# Patient Record
Sex: Female | Born: 1945 | Race: White | Hispanic: No | Smoking: Never smoker
Health system: Southern US, Community
[De-identification: ages and names within clinical notes are randomized; demographics above are authoritative.]

## PROBLEM LIST (undated history)

## (undated) DIAGNOSIS — I10 Essential (primary) hypertension: Secondary | ICD-10-CM

## (undated) DIAGNOSIS — J449 Chronic obstructive pulmonary disease, unspecified: Secondary | ICD-10-CM

## (undated) DIAGNOSIS — I509 Heart failure, unspecified: Secondary | ICD-10-CM

## (undated) DIAGNOSIS — K219 Gastro-esophageal reflux disease without esophagitis: Secondary | ICD-10-CM

## (undated) DIAGNOSIS — M1990 Unspecified osteoarthritis, unspecified site: Secondary | ICD-10-CM

## (undated) DIAGNOSIS — E669 Obesity, unspecified: Secondary | ICD-10-CM

## (undated) DIAGNOSIS — G4733 Obstructive sleep apnea (adult) (pediatric): Secondary | ICD-10-CM

## (undated) DIAGNOSIS — I639 Cerebral infarction, unspecified: Secondary | ICD-10-CM

## (undated) DIAGNOSIS — D649 Anemia, unspecified: Secondary | ICD-10-CM

## (undated) DIAGNOSIS — I4891 Unspecified atrial fibrillation: Secondary | ICD-10-CM

## (undated) DIAGNOSIS — Z9981 Dependence on supplemental oxygen: Secondary | ICD-10-CM

## (undated) DIAGNOSIS — I2510 Atherosclerotic heart disease of native coronary artery without angina pectoris: Secondary | ICD-10-CM

## (undated) DIAGNOSIS — E785 Hyperlipidemia, unspecified: Secondary | ICD-10-CM

## (undated) DIAGNOSIS — R2681 Unsteadiness on feet: Secondary | ICD-10-CM

## (undated) HISTORY — DX: Gastro-esophageal reflux disease without esophagitis: K21.9

## (undated) HISTORY — DX: Dependence on supplemental oxygen: Z99.81

## (undated) HISTORY — PX: CATARACT EXTRACTION W/ INTRAOCULAR LENS  IMPLANT, BILATERAL: SHX1307

## (undated) HISTORY — DX: Hyperlipidemia, unspecified: E78.5

## (undated) HISTORY — DX: Obesity, unspecified: E66.9

## (undated) HISTORY — PX: UMBILICAL HERNIA REPAIR: SHX196

## (undated) HISTORY — DX: Unspecified osteoarthritis, unspecified site: M19.90

## (undated) HISTORY — DX: Unsteadiness on feet: R26.81

## (undated) HISTORY — PX: CHOLECYSTECTOMY: SHX55

## (undated) HISTORY — PX: KNEE ARTHROSCOPY: SUR90

## (undated) HISTORY — PX: EYE SURGERY: SHX253

## (undated) HISTORY — PX: APPENDECTOMY: SHX54

## (undated) HISTORY — PX: CERVICAL SPINE SURGERY: SHX589

## (undated) HISTORY — DX: Anemia, unspecified: D64.9

## (undated) HISTORY — PX: HIP SURGERY: SHX245

## (undated) HISTORY — DX: Obstructive sleep apnea (adult) (pediatric): G47.33

## (undated) HISTORY — DX: Unspecified atrial fibrillation: I48.91

## (undated) HISTORY — DX: Essential (primary) hypertension: I10

## (undated) HISTORY — DX: Atherosclerotic heart disease of native coronary artery without angina pectoris: I25.10

## (undated) HISTORY — DX: Cerebral infarction, unspecified: I63.9

## (undated) HISTORY — PX: CARPAL TUNNEL RELEASE: SHX101

## (undated) HISTORY — DX: Heart failure, unspecified: I50.9

---

## 2001-09-26 ENCOUNTER — Ambulatory Visit (HOSPITAL_COMMUNITY): Admission: RE | Admit: 2001-09-26 | Discharge: 2001-09-26 | Payer: Self-pay | Admitting: Neurosurgery

## 2001-09-26 ENCOUNTER — Encounter: Payer: Self-pay | Admitting: Neurosurgery

## 2001-10-05 ENCOUNTER — Encounter: Payer: Self-pay | Admitting: Neurosurgery

## 2001-10-07 ENCOUNTER — Inpatient Hospital Stay (HOSPITAL_COMMUNITY): Admission: RE | Admit: 2001-10-07 | Discharge: 2001-10-08 | Payer: Self-pay | Admitting: Neurosurgery

## 2001-10-07 ENCOUNTER — Encounter: Payer: Self-pay | Admitting: Neurosurgery

## 2002-11-22 ENCOUNTER — Ambulatory Visit (HOSPITAL_COMMUNITY): Admission: RE | Admit: 2002-11-22 | Discharge: 2002-11-22 | Payer: Self-pay | Admitting: Internal Medicine

## 2002-11-22 HISTORY — PX: COLONOSCOPY: SHX174

## 2002-11-22 HISTORY — PX: UPPER GASTROINTESTINAL ENDOSCOPY: SHX188

## 2003-09-28 ENCOUNTER — Encounter: Payer: Self-pay | Admitting: Pulmonary Disease

## 2004-04-14 ENCOUNTER — Encounter: Admission: RE | Admit: 2004-04-14 | Discharge: 2004-04-14 | Payer: Self-pay | Admitting: Orthopedic Surgery

## 2004-05-03 ENCOUNTER — Encounter: Admission: RE | Admit: 2004-05-03 | Discharge: 2004-05-03 | Payer: Self-pay | Admitting: Orthopedic Surgery

## 2004-05-03 ENCOUNTER — Ambulatory Visit (HOSPITAL_COMMUNITY): Admission: RE | Admit: 2004-05-03 | Discharge: 2004-05-03 | Payer: Self-pay | Admitting: Orthopedic Surgery

## 2004-05-21 ENCOUNTER — Ambulatory Visit (HOSPITAL_COMMUNITY): Admission: RE | Admit: 2004-05-21 | Discharge: 2004-05-22 | Payer: Self-pay | Admitting: Orthopedic Surgery

## 2004-11-05 ENCOUNTER — Observation Stay (HOSPITAL_COMMUNITY): Admission: AD | Admit: 2004-11-05 | Discharge: 2004-11-06 | Payer: Self-pay | Admitting: Cardiology

## 2004-11-05 ENCOUNTER — Ambulatory Visit: Payer: Self-pay | Admitting: Cardiology

## 2004-11-26 ENCOUNTER — Ambulatory Visit: Payer: Self-pay | Admitting: Cardiology

## 2004-11-26 ENCOUNTER — Ambulatory Visit: Payer: Self-pay

## 2005-04-14 ENCOUNTER — Encounter: Payer: Self-pay | Admitting: Pulmonary Disease

## 2005-08-22 ENCOUNTER — Ambulatory Visit (HOSPITAL_COMMUNITY): Admission: RE | Admit: 2005-08-22 | Discharge: 2005-08-23 | Payer: Self-pay | Admitting: Orthopedic Surgery

## 2005-10-10 ENCOUNTER — Ambulatory Visit: Payer: Self-pay | Admitting: *Deleted

## 2005-10-20 ENCOUNTER — Ambulatory Visit: Payer: Self-pay | Admitting: Cardiology

## 2005-11-26 HISTORY — PX: COLONOSCOPY: SHX174

## 2006-03-19 ENCOUNTER — Encounter: Admission: RE | Admit: 2006-03-19 | Discharge: 2006-04-15 | Payer: Self-pay | Admitting: Family Medicine

## 2006-04-16 ENCOUNTER — Encounter: Admission: RE | Admit: 2006-04-16 | Discharge: 2006-05-20 | Payer: Self-pay | Admitting: Family Medicine

## 2006-12-01 DIAGNOSIS — G4733 Obstructive sleep apnea (adult) (pediatric): Secondary | ICD-10-CM

## 2006-12-01 HISTORY — DX: Obstructive sleep apnea (adult) (pediatric): G47.33

## 2008-01-17 ENCOUNTER — Ambulatory Visit: Payer: Self-pay | Admitting: Cardiology

## 2008-01-17 LAB — CONVERTED CEMR LAB
BUN: 15 mg/dL (ref 6–23)
Basophils Relative: 0.3 % (ref 0.0–1.0)
CO2: 32 meq/L (ref 19–32)
GFR calc Af Amer: 109 mL/min
Glucose, Bld: 157 mg/dL — ABNORMAL HIGH (ref 70–99)
Hemoglobin: 11 g/dL — ABNORMAL LOW (ref 12.0–15.0)
Lymphocytes Relative: 20.6 % (ref 12.0–46.0)
MCV: 86.3 fL (ref 78.0–100.0)
Monocytes Absolute: 0.6 10*3/uL (ref 0.2–0.7)
Monocytes Relative: 7.3 % (ref 3.0–11.0)
Neutro Abs: 5.5 10*3/uL (ref 1.4–7.7)
Potassium: 4.1 meq/L (ref 3.5–5.1)
Prothrombin Time: 13.5 s — ABNORMAL HIGH (ref 10.9–13.3)

## 2008-01-20 ENCOUNTER — Ambulatory Visit: Payer: Self-pay | Admitting: Cardiology

## 2008-01-20 ENCOUNTER — Ambulatory Visit (HOSPITAL_COMMUNITY): Admission: RE | Admit: 2008-01-20 | Discharge: 2008-01-20 | Payer: Self-pay | Admitting: Cardiology

## 2008-01-25 ENCOUNTER — Encounter: Payer: Self-pay | Admitting: Pulmonary Disease

## 2008-01-26 ENCOUNTER — Encounter: Payer: Self-pay | Admitting: Cardiology

## 2008-01-26 ENCOUNTER — Ambulatory Visit: Payer: Self-pay | Admitting: Cardiology

## 2008-01-26 ENCOUNTER — Ambulatory Visit: Payer: Self-pay

## 2008-02-28 ENCOUNTER — Ambulatory Visit: Payer: Self-pay | Admitting: Pulmonary Disease

## 2008-02-28 DIAGNOSIS — C439 Malignant melanoma of skin, unspecified: Secondary | ICD-10-CM | POA: Insufficient documentation

## 2008-02-28 DIAGNOSIS — I509 Heart failure, unspecified: Secondary | ICD-10-CM

## 2008-02-28 DIAGNOSIS — G4730 Sleep apnea, unspecified: Secondary | ICD-10-CM | POA: Insufficient documentation

## 2008-02-28 DIAGNOSIS — J984 Other disorders of lung: Secondary | ICD-10-CM | POA: Insufficient documentation

## 2008-02-28 DIAGNOSIS — I739 Peripheral vascular disease, unspecified: Secondary | ICD-10-CM

## 2008-02-28 DIAGNOSIS — I270 Primary pulmonary hypertension: Secondary | ICD-10-CM

## 2008-03-01 ENCOUNTER — Ambulatory Visit: Payer: Self-pay | Admitting: Cardiology

## 2008-03-30 ENCOUNTER — Ambulatory Visit: Payer: Self-pay | Admitting: Pulmonary Disease

## 2008-03-30 ENCOUNTER — Ambulatory Visit: Admission: RE | Admit: 2008-03-30 | Discharge: 2008-03-30 | Payer: Self-pay | Admitting: Pulmonary Disease

## 2008-03-30 ENCOUNTER — Ambulatory Visit: Payer: Self-pay | Admitting: Cardiology

## 2008-04-06 ENCOUNTER — Encounter: Payer: Self-pay | Admitting: Pulmonary Disease

## 2008-04-26 ENCOUNTER — Encounter: Payer: Self-pay | Admitting: Pulmonary Disease

## 2008-05-03 ENCOUNTER — Ambulatory Visit: Payer: Self-pay | Admitting: Cardiology

## 2008-06-27 ENCOUNTER — Ambulatory Visit: Payer: Self-pay | Admitting: Pulmonary Disease

## 2008-06-28 ENCOUNTER — Ambulatory Visit: Payer: Self-pay | Admitting: Cardiology

## 2008-08-11 ENCOUNTER — Ambulatory Visit: Payer: Self-pay | Admitting: Cardiovascular Disease

## 2008-08-11 ENCOUNTER — Inpatient Hospital Stay (HOSPITAL_COMMUNITY): Admission: EM | Admit: 2008-08-11 | Discharge: 2008-08-14 | Payer: Self-pay | Admitting: Emergency Medicine

## 2008-09-06 ENCOUNTER — Ambulatory Visit: Payer: Self-pay | Admitting: Cardiology

## 2008-10-09 ENCOUNTER — Ambulatory Visit: Payer: Self-pay | Admitting: Cardiology

## 2008-10-24 ENCOUNTER — Ambulatory Visit: Payer: Self-pay | Admitting: Pulmonary Disease

## 2009-09-19 ENCOUNTER — Ambulatory Visit: Payer: Self-pay | Admitting: Cardiology

## 2009-09-19 DIAGNOSIS — R609 Edema, unspecified: Secondary | ICD-10-CM

## 2009-09-19 DIAGNOSIS — I4891 Unspecified atrial fibrillation: Secondary | ICD-10-CM | POA: Insufficient documentation

## 2010-09-25 ENCOUNTER — Ambulatory Visit: Payer: Self-pay | Admitting: Cardiology

## 2011-01-02 NOTE — Assessment & Plan Note (Signed)
Summary: [facility]      Allergies Added:   Visit Type:  Follow-up Primary Provider:  Mercie Eon  CC:  Atrial Fibrillation.  History of Present Illness: The patient presents for followup of atrial fibrillation. Since I last saw her she has had no cardiovascular complaints. She doesn't notice any palpitations, presyncope or syncope. She has had no chest pressure, neck or arm discomfort. She has had no new shortness of breath, PND or orthopnea. She said no change in her lower extremity edema.  Current Medications (verified): 1)  Lovaza 1 Gm  Caps (Omega-3-Acid Ethyl Esters) .... 2 Tabs Two Times A Day 2)  Furosemide 40 Mg Tabs (Furosemide) .... 2 Qam and 1 Qpm 3)  Warfarin Sodium 5 Mg Tabs (Warfarin Sodium) .... Daily 4)  Zyrtec Allergy 10 Mg  Tabs (Cetirizine Hcl) .... Once Daily 5)  Levemir 100 Unit/ml  Soln (Insulin Detemir) .... Once Daily 6)  Proventil Hfa 108 (90 Base) Mcg/act  Aers (Albuterol Sulfate) .... As Needed 7)  Diltiazem Hcl Er Beads 180 Mg Xr24h-Cap (Diltiazem Hcl Er Beads) .... Take One Capsule By Mouth Daily 8)  Lisinopril 20 Mg Tabs (Lisinopril) .... Daily 9)  Klor-Con M20 20 Meq Cr-Tabs (Potassium Chloride Crys Cr) .... Daily  Allergies (verified): 1)  ! Codeine  Past History:  Past Medical History: Coronary disease (catheterization on January 20, 2008, the left main is 25% stenosis, LAD proximal 60% stenosis, first   diagonal 40% stenosis, circumflex diffuse irregularities, RCA 25%   stenosis) Obstructive sleep apnea -diagnosed 3 yrs ago (CPAP is not working) Non-insulin dependent diabetes mellitus Obesity Hypertension Hyperlipidemia Gastroesophageal reflux disease Asthma  Past Surgical History: Reviewed history from 09/19/2009 and no changes required. Left knee arthroscopic surgery June 2005 Cervical diskectomy Cataracts surgery Cholecystectomy Carpal tunnel surgery Appendectomy Cesarean section Umbilical hernia repair  Review  of Systems       As stated in the HPI and negative for all other systems.   Vital Signs:  Patient profile:   65 year old female Height:      66 inches Weight:      322 pounds BMI:     52.16 Pulse rate:   74 / minute Resp:     18 per minute BP sitting:   142 / 78  (right arm)  Vitals Entered By: Levora Angel, CNA (September 25, 2010 10:23 AM)  Physical Exam  General:  Well developed, well nourished, in no acute distress. Head:  normocephalic and atraumatic Neck:  Neck supple, no JVD. No masses, thyromegaly or abnormal cervical nodes. Chest Wall:  no deformities or breast masses noted Lungs:  Clear bilaterally to auscultation and percussion. Heart:  Non-displaced PMI, chest non-tender;irregular rate and rhythm, S1, S2 without murmurs, rubs or gallops.  Abdomen:  Bowel sounds positive; abdomen soft and non-tender without masses, organomegaly, or hernias noted. No hepatosplenomegaly (morbid obesity compromises the exam) Msk:  Back normal, normal gait. Muscle strength and tone normal. Pulses:  absent dorsalis pedis and posterior tibialis bilaterally Extremities:  severe bilateral edema with chronic venous stasis changes Neurologic:  Alert and oriented x 3. Psych:  Normal affect.   EKG  Procedure date:  09/25/2010  Findings:      past atrial fibrillation, rate 78, axis within normal intervals normal limits, low voltage, diffuse not specific T wave flattening  Impression & Recommendations:  Problem # 1:  FIBRILLATION, ATRIAL (ICD-427.31) She tolerates this rhythm with rate control and anticoagulation. No change in therapy is indicated.  Orders: EKG w/ Interpretation (93000)  Problem # 2:  EDEMA (ICD-782.3) This is stable and multifactorial.  No change in therapy is needed.  She needs to lose weight and we have talked about this.  It is clear from conversation today that she does not watch her diet.  Patient Instructions: 1)  Your physician recommends that you schedule a  follow-up appointment in: 18 months with Dr Percival Spanish in [place] 2)  Your physician recommends that you continue on your current medications as directed. Please refer to the Current Medication list given to you today.

## 2011-02-06 DIAGNOSIS — I5023 Acute on chronic systolic (congestive) heart failure: Secondary | ICD-10-CM

## 2011-02-21 ENCOUNTER — Encounter (INDEPENDENT_AMBULATORY_CARE_PROVIDER_SITE_OTHER): Payer: Self-pay | Admitting: *Deleted

## 2011-02-27 NOTE — Letter (Signed)
Summary: Pre Visit Letter Revised  [facility]  [place], [place] [address]   Phone: [phone]  Fax: [phone]        02/21/2011 MRN: [id number] [facility] [place] [address] [place], [place]  [address]             Procedure Date:  04-01-11           Direct Colon----Dr. Carlean Purl   Welcome to the Gastroenterology Division at [facility].    You are scheduled to see a nurse for your pre-procedure visit on 03-18-11 at 2:30p.m. on the 3rd floor at [facility] [place] [employer].  We ask that you try to arrive at our office 15 minutes prior to your appointment time to allow for check-in.  Please take a minute to review the attached form.  If you answer "Yes" to one or more of the questions on the first page, we ask that you call the person listed at your earliest opportunity.  If you answer "No" to all of the questions, please complete the rest of the form and bring it to your appointment.    Your nurse visit will consist of discussing your medical and surgical history, your immediate family medical history, and your medications.   If you are unable to list all of your medications on the form, please bring the medication bottles to your appointment and we will list them.  We will need to be aware of both prescribed and over the counter drugs.  We will need to know exact dosage information as well.    Please be prepared to read and sign documents such as consent forms, a financial agreement, and acknowledgement forms.  If necessary, and with your consent, a friend or relative is welcome to sit-in on the nurse visit with you.  Please bring your insurance card so that we may make a copy of it.  If your insurance requires a referral to see a specialist, please bring your referral form from your primary care physician.  No co-pay is required for this nurse visit.     If you cannot keep your appointment, please call [phone] to cancel or reschedule prior to  your appointment date.  This allows [place] the opportunity to schedule an appointment for another patient in need of care.    Thank you for choosing [facility] for your medical needs.  We appreciate the opportunity to care for you.  Please visit [place] at our website  to learn more about our practice.  Sincerely, The Gastroenterology Division

## 2011-03-14 ENCOUNTER — Other Ambulatory Visit: Payer: Self-pay | Admitting: *Deleted

## 2011-03-14 MED ORDER — POTASSIUM CHLORIDE CRYS ER 20 MEQ PO TBCR: 20.0000 meq | EXTENDED_RELEASE_TABLET | Freq: Every day | ORAL | Status: DC

## 2011-03-18 ENCOUNTER — Ambulatory Visit (AMBULATORY_SURGERY_CENTER): Payer: Medicare Other | Admitting: *Deleted

## 2011-03-18 VITALS — Ht 66.0 in | Wt 343.0 lb

## 2011-03-18 DIAGNOSIS — Z1211 Encounter for screening for malignant neoplasm of colon: Secondary | ICD-10-CM

## 2011-03-18 NOTE — Progress Notes (Signed)
Colon cancelled and new patient visit made for pt. June 4 with Dr. Carlean Purl.  Pt. Is on coumadin and has multiple medical issues.

## 2011-04-01 ENCOUNTER — Other Ambulatory Visit: Payer: Medicare Other | Admitting: Internal Medicine

## 2011-04-15 NOTE — Cardiovascular Report (Signed)
NAME:  [name], [name] NO.:  [id number]   MEDICAL RECORD NO.:  [id number]          PATIENT TYPE:  OIB   LOCATION:  2853                         FACILITY:  [facility]   PHYSICIAN:  Minus Breeding, MD, FACCDATE OF BIRTH:  21-Jul-1946   DATE OF PROCEDURE:  DATE OF DISCHARGE:                            CARDIAC CATHETERIZATION   PRIMARY CARE PHYSICIAN:  Dr. Morrie [facility].   CARDIOLOGIST:  Dr. Percival Spanish.   PROCEDURE:  Left and right heart catheterization, coronary  arteriography.   INDICATIONS:  Evaluate patient with dyspnea and chest discomfort  suggestive of unstable angina.  She had previous nonobstructive disease  noted on catheterization.   PROCEDURE NOTE:  Left heart catheterization performed via right femoral  artery, right heart catheterization performed via right femoral vein.  Both vessels cannulated using the wall puncture.  A #5-French arterial  sheath and a #7-French venous sheath were inserted via the Seldinger  technique.  Preformed Judkins and pigtail catheter were utilized.  The  patient tolerated procedure well and left the lab in stable condition.   NOTE:  A Swan-Ganz catheter were used utilized as well.   HEMODYNAMIC RESULTS:  RA mean 17, RV 59/7 with a mean of 19, PA 58/28  with a mean of 42, pulmonary capillary wedge pressure mean 26 (there is  a large V-wave), the left anterior pressure 160/19 with a mean of 27, AO  164/75, cardiac output/cardiac index 5.38/2.34, pulmonary vascular  resistance 238 dynes.  Coronaries:  Left main at 25% stenosis.  The LAD  had a proximal 60% stenosis.  There was mid to distal 60% stenosis.  There was a first diagonal which was moderate-sized with ostial 40%  stenosis.  The circumflex in the AV groove had diffuse luminal  irregularities.  There was a first obtuse marginal which was large and  normal.  There was a second obtuse marginal which was moderate in size  and normal.  There were two posterolaterals which were  small and normal.  The right coronary artery is large dominant vessel.  He had a proximal  25% stenosis.  PDA was moderate size and normal.  Posterolateral branch  too was small and normal.   LEFT VENTRICULOGRAM:  The left ventriculogram was obtained in the RAO  projection.  The EF 65% with trace mitral regurgitation.   CONCLUSION:  Nonobstructive coronary artery disease.  She has preserved  ejection fraction.  She does have an elevated left ventricular end-  diastolic pressure and elevated pulmonary pressures.  There is a big V-  wave on the wedge tracing.  I think this indicates some left ventricular  diastolic dysfunction contributing to pulmonary hypertension.  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   PLAN:  I am getting an echocardiogram to further rule out any mitral  regurgitation or other abnormalities and to again look at diastolic  parameters.  She is going to need volume management.  We are going to  manage her with secondary risk reduction and  manager her coronary  disease medically.  Weight loss is going to be absolutely critical.  I  am going to try to get her to get her sleep apnea better treated as she  is not wearing CPAP.      Minus Breeding, MD, [facility]  Electronically Signed     JH/MEDQ  D:  01/20/2008  T:  01/21/2008  Job:  [id number]   cc:   Chipper Herb, M.D.

## 2011-04-15 NOTE — Assessment & Plan Note (Signed)
[facility] OFFICE NOTE   NAME:Goldston, ZAFIRO MILNES                    MRN:          [id number]  DATE:01/26/2008                            DOB:          03-12-1946    PRIMARY CARE PHYSICIAN:  Chipper Herb, M.D.   REASON FOR PRESENTATION:  Evaluate patient with dyspnea.   HISTORY OF PRESENT ILLNESS:  The patient presents for follow-up of her  dyspnea.  She had a cardiac catheterization on January 20, 2008.  This  demonstrated elevated right-sided pressures with a mean pulmonary  pressure 42 and capillary wedge pressure 26 with large V-waves.  She did  have some elevated end-diastolic pressure.  Coronaries demonstrated 25%  left main stenosis, LAD had proximal 60% stenosis, mid to distal LAD had  60% stenosis, first diagonal was moderate-size with ostial 40% stenosis,  circumflex had luminal irregularities, the right coronary artery was  dominant with about 25% stenosis.  The EF 65%.   The patient did well after catheterization.  She still complains of  dyspnea.  She is in permanent atrial fibrillation.  She does feel her  palpitations.  So is not having any new complaints and has had no chest  discomfort, PND or orthopnea.   PAST MEDICAL HISTORY:  1. Coronary disease as described.  2. Obstructive sleep apnea (not consistently wearing CPAP).  3. Non-insulin-dependent diabetes mellitus  4. Obesity.  5. Hypertension.  6. Hyperlipidemia.  7. Gastroesophageal reflux disease.  8. Asthma.  9. Left knee arthroscopic surgery June 2005.  10.Cervical diskectomy.  11.Cataract.  12.Cholecystectomy.  13.Carpal tunnel surgery.  14.Appendectomy.  15.Cesarean section.  16.Umbilical hernia.   ALLERGIES:  CODEINE.   CURRENT MEDICATIONS:  1. Lovaza 2 grams b.i.d.  2. Actos 30 mg daily.  3. Amitriptyline 50 mg nightly.  4. Glimepiride 6 mg daily.  5. Simcor 1000/20 daily.  6. Sertraline.  7. Furosemide.  8.  Coumadin.  9. Zyrtec 10 mg daily  10.Levemir.   REVIEW OF SYSTEMS:  As stated in the HPI, otherwise negative for other  systems.   PHYSICAL EXAMINATION:  GENERAL APPEARANCE:  The patient is in no acute  distress.  She is pleasant.  VITAL SIGNS:  Blood pressure 156/83, heart rate 81 and regular, weight  326 pounds, body mass index 57.  NECK:  No jugular venous distension at 45 degrees, carotid upstroke  brisk and symmetrical.  No bruits, no thyromegaly.  LUNGS:  Clear to auscultation bilaterally.  HEART:  PMI not displaced or sustained, S1-S2 within normal, no S3, no  murmurs.  ABDOMEN:  Obese, positive bowel sounds, normal in frequency and pitch,  no rebound or guarding, no obvious midline pulsatile mass or  organomegaly.  EXTREMITIES:  2+ pulse, nonpitting edema.   EKG:  Atrial fibrillation, low voltage, axis within normal limits, no  nonspecific T-wave flattening.   ASSESSMENT/PLAN:  1. Dyspnea.  I think this is multifactorial.  She does have some      evidence of pulmonary hypertension and she had V-waves.  I am going      to look for mitral regurgitation  on the echo that was done today      and also look for other indicators of diastolic dysfunction.      Certainly she needs to lose weight.  She needs to wear her CPAP.      She needs to restrict salt and fluid.  She needs good blood      pressure control.  2. Atrial fibrillation.  She has rate control and she is tolerating      anticoagulation.  3. Hypertension.  She is not sure what blood pressure medication she      is taking and I do not see it listed.  She is going to call back as      she is probably going to need an adjustment with either an addition      or up-titration.  4. Follow-up:  Will see her back in about six weeks in [place], [place].     Minus Breeding, MD, [facility]  Electronically Signed    JH/MedQ  DD: 01/26/2008  DT: 01/27/2008  Job #: [id number]   cc:   Chipper Herb, M.D.

## 2011-04-15 NOTE — Assessment & Plan Note (Signed)
[facility]                            CARDIOLOGY OFFICE NOTE   NAME:Ysaguirre, CHRISTENE STORDAHL                    MRN:          [id number]  DATE:06/28/2008                            DOB:          February 07, 1946    PRIMARY CARE PHYSICIAN:  Chipper Herb, MD.   REASON FOR PRESENTATION:  Evaluate the patient for dyspnea.   HISTORY OF PRESENT ILLNESS:  The patient presents for followup of her  known dyspnea.  She has had an extensive workup in the past.  She has  seen Dr. Elsworth Soho and is being treated with Proventil.  There may be some  component reactive airway disease.  I have also documented that she has  had pulmonary hypertension.  This has been somewhat related to probable  left ventricular diastolic dysfunction.  Also her Clydene Laming unit is greater  than 3, indicating probable pulmonary artery hypertension as well.  She  is morbidly obese and does have sleep apnea.   She says her dyspnea is at baseline.  She does get short of breath doing  moderate activity.  She can do some limited housework.  She is limited  and also by chronic lower extremity swelling.  This has not changed.  Her breathing has not changed or perhaps slightly better with a  Proventil.  She is not describing PND or orthopnea.  She has not been  having any palpitation, presyncope, or syncope.  She remains in atrial  fibrillation.  She has had no chest discomfort.   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   ALLERGIES:  CODEINE.   MEDICATIONS:  Lovaza 2 g b.i.d., amitriptyline 50 mg nightly,  glimepiride 6 mg daily, Simcor 1000/20 daily, sertraline, Coumadin,  Zyrtec, Levemir, Proventil, furosemide 40 mg daily, and Cardizem 120 mg  daily.   REVIEW OF SYSTEMS:  As stated in the HPI, otherwise negative for other  systems.   PHYSICAL EXAMINATION:  GENERAL:  The patient is in no distress.  VITAL SIGNS:  Blood pressure 158/72, heart rate 78 and regular, weight  332 pounds, body mass index 57!  HEENT:  Eyes are unremarkable.  Pupils are equal, round, and reactive to  light.  Fundi not visualized.  Oral mucosa unremarkable.  NECK:  No obvious jugular venous distension at 45 degrees.  Carotid  upstroke brisk and symmetrical.  No bruits.  No thyromegaly.  LYMPHATICS:  No cervical, axillary, or inguinal adenopathy.  LUNGS:  Decreased breath sounds without wheezing or crackles.  BACK:  No costovertebral angle tenderness.  CHEST:  Unremarkable.  HEART:  PMI not displaced or sustained.  S1 and S2 within normals  limits.  No S3, S4, no clicks, no rubs, or murmurs.  ABDOMEN:  Morbidly obese, positive bowel sounds.  Normal in frequency  and pitch.  No bruits, no rebound, and no guarding.  No midline  pulsatile mass.  No hepatomegaly or splenomegaly.  SKIN:  No rashes, no nodules.  EXTREMITIES:  2+ upper pulses, unable to appreciate dorsalis pedis and  post tibialis bilaterally.  Bilateral moderate lower extremity edema  with chronic venous stasis changes.  NEURO:  Orient to person, place, and time.  Cranial nerves II-XII  grossly intact.  Motor grossly intact.   ASSESSMENT AND PLAN:  1. Dyspnea.  The patient's predominant complaint is shortness of      breath.  I do believe she may have some  component to pulmonary      artery hypertension, though it is a mixed picture with clear      diastolic dysfunction as well.  There is also some component of      asthma.  I think a big component is deconditioning.  Given all of      this, I doubt that she would benefit from drugs, specifically for      management of pulmonary artery hypertension.  What she really needs      is to continue diuretic, control of her blood pressure, fluid and      salt restriction, and most importantly weight loss.  2. Lower extremity edema as above.  3. Hypertension.  I will take this opportunity to increase her      Cardizem to 180 mg daily as she has not yet controlled with her      blood pressure.  4. Morbid obesity.  We discussed this and I have referred her to [facility], where she needs to discuss with them about      possible bariatric surgery.  5. Coronary artery disease.  She had nonobstructive disease and she      will continue with risk reduction.  6. Followup.  I will see back her in 6 months or sooner if needed.     Minus Breeding, MD, [facility]  Electronically Signed    JH/MedQ  DD: 06/28/2008  DT: 06/29/2008  Job #: [id number]   cc:   Chipper Herb, M.D.

## 2011-04-15 NOTE — Assessment & Plan Note (Signed)
[facility]                            CARDIOLOGY OFFICE NOTE   NAME:[name], [name]                    MRN:          [id number]  DATE:03/01/2008                            DOB:          Apr 24, 1946    PRIMARY CARE PHYSICIAN:  Chipper Herb, M.D.   REASON FOR PRESENTATION:  Evaluate the patient with dyspnea.   HISTORY OF PRESENT ILLNESS:  The patient presents for follow up of her  shortness of breath.  This has not gotten any better despite starting  diuretic.  She was put on Lasix 20 mg daily.  She is still getting  dyspneic with moderate exertion such as walking moderate distance on  level ground.  She did see Dr. Elsworth Soho and is apparently having several  pulmonary tests done.  I am not sure what these are as I have not  received a record from him.  She has not had any new PND or orthopnea.  She remains in persistent atrial fibrillation.   PAST MEDICAL HISTORY:  1. Coronary artery disease (25% left main stenosis, LAD 60% proximal      stenosis, mid-distal LAD 60% stenosis, first diagonal moderate-      sized with ostial 40% stenosis, circumflex luminal irregularities,      right coronary artery at 25% stenosis, the EF 65%.).  2. Obstructive sleep apnea (not consistently wearing CPAP).  3. Non-insulin diabetes mellitus.  4. Obesity.  5. Hypertension.  6. Hyperlipidemia.  7. Gastroesophageal reflux disease.  8. Asthma.  9. Elevated right-sided heart pressures (mean pulmonary artery      pressure was 42 with a wedge pressure of 26 and large V-waves.  The      patient did not have MR on transthoracic echo).  10.Left knee arthroscopic surgery.  11.Cervical diskectomy.  12.Cataract.  13.Cholecystectomy.  14.Carpal tunnel surgery.  15.Appendectomy.  16.Cesarean section.  17.Umbilical hernia repair.   ALLERGIES:  CODEINE.   MEDICATIONS:  1. Lovaza 2 gm b.i.d.  2. Actos 30 mg daily.  3. Amitriptyline 50 mg q.h.s.  4. Glimepiride 6 mg  daily.  5. Simcor 100/20 daily.  6. Sertraline 50 mg q.a.m.  7. Coumadin.  8. Zyrtec 10 mg daily.  9. Levemir.  10.Proventil.  11.Furosemide 40 mg daily.   REVIEW OF SYSTEMS:  As stated in the HPI, otherwise negative for other  systems.   PHYSICAL EXAMINATION:  GENERAL:  The patient is in no acute distress.  VITAL SIGNS:  Blood pressure 144/70, heart rate 86 and regular, oxygen  saturation on room air 95%, weight 332 pounds, body mass index 57.  HEENT:  Eyes are unremarkable.  Pupils equal, round and reactive to  light.  Fundi not visualized.  NECK:  No jugular venous distention at 45 degrees.  Carotid upstroke  brisk and symmetric, no bruits or thyromegaly.  LUNGS:  Clear to auscultation bilaterally.  BACK:  No costovertebral angle mass.  CHEST:  Unremarkable.  HEART:  PMI not displaced or sustained, S1-S2 within normal limits.  No  S3, no murmurs.  ABDOMEN:  Morbidly obese, positive bowel sounds, normal in frequency and  pitch.  No bruits, rebound, guarding or midline pulsatile mass,  organomegaly.  SKIN:  No rashes, no nodules.  EXTREMITIES:  2+ pulses, nonpitting bilateral lower extremity edema, no  cyanosis or clubbing.  NEURO:  Oriented to person, place and time.  Cranial nerves II-XII  grossly intact.  Motor grossly intact.   ASSESSMENT/PLAN:  1. Dyspnea.  The patient is currently getting a pulmonary workup.  I      am going to check Dr. Bari Mantis records to see what these tests are.      She does have elevated pulmonary pressures and elevated wedge.  I      will wait to see a completed pulmonary workup before deciding any      further testing.  TEE might be helpful to make sure there is no      occult mitral valve disease.  2. Atrial fibrillation.  I have not applied a 24 hour monitor to make      sure she has reasonable rate control.  She is tolerating      anticoagulation.  I will put a Holter on.  3. Hypertension.  Blood pressure is better controlled and she will       continue with the medicines as listed.  4. Morbid obesity.  This is I think contributing significantly to many      of the patient's complaints and she needs to treat this with diet      and exercise as she has not been at all successful.  5. Coronary disease.  She has nonobstructive coronary disease.  She      does have some chest discomfort.  However, there is no evidence      that this is anginal.  She will continue with risk reduction.   FOLLOW UP:  I would like to see her back in about 2 months after the  pulmonary workup or sooner if she has any acute problems.     Minus Breeding, MD, [facility]  Electronically Signed    JH/MedQ  DD: 03/01/2008  DT: 03/01/2008  Job #: [id number]   cc:   Rigoberto Noel, MD  Chipper Herb, M.D.

## 2011-04-15 NOTE — H&P (Signed)
NAMEJAVONNI, [name]             ACCOUNT NO.:  [id number]   MEDICAL RECORD NO.:  [id number]          PATIENT TYPE:  INP   LOCATION:  2040                         FACILITY:  [facility]   PHYSICIAN:  Wendy Bamberg. Johnsie Cancel, MD, FACCDATE OF BIRTH:  07/26/1946   DATE OF ADMISSION:  08/11/2008  DATE OF DISCHARGE:                              HISTORY & PHYSICAL   PRIMARY CARDIOLOGIST:  Wendy Breeding, MD, [facility], in [place].   PRIMARY CARE [name]:  [name]. [name], nurse practitioner in  [place], [place].   PATIENT PROFILE:  A 65 year old Caucasian female with history of  nonobstructive CAD, chronic diastolic heart failure, pulmonary  hypertension as well as chronic AFib on Coumadin who presents with chest  pain as well as acute-on-chronic diastolic CHF.   PROBLEM LIST:  1. Acute-on-chronic diastolic congestive heart failure.  1A.  On January 26, 2008, 2-D echocardiogram, EF 55-60%, mild LVH, mild  AI and MR, mild-to-moderately dilated LA an RA, moderately elevated peak  pulmonary artery systolic pressure, and dilated IVC.  1. Nonobstructive CAD.  2A.  On January 20, 2008, right and left heart cardiac catheterization  RA17.  RV 59/7, PA 58/28 (42).  PCW P26 with large V wave.  Left main  25%, LAD 60% proximal and distal, D1 40% ostial, left circumflex diffuse  irregularities, OM1 normal, OM2 normal, RCA 25% proximal, PDA normal, EF  65% with trace mitral regurgitation.  1. Pulmonary arterial hypertension.  2. Hypertension.  3. Hyperlipidemia.  4. Morbid obesity.  5. Chronic lower extremity edema.  6. Obstructive sleep apnea with CPAP noncompliance.  7. Type 2 diabetes mellitus.  8. GERD.  9. Asthma.  10.History of left arthroscopic knee surgery, September 2006.  11.Status post cervical diskectomy.  12.Status post cholecystectomy.  13.Cataracts.  14.Status post appendectomy.  15.Permanent atrial fibrillation on chronic Coumadin therapy followed      at  [facility].   HISTORY OF PRESENT ILLNESS:  A 65 year old Caucasian female with history  of chronic diastolic heart failure, pulmonary hypertension, and  nonobstructive CAD.  She has chronic dyspnea on exertion along with a  history of chest pain occurring 2-3 times per year dating back to 2005.  Her last catheterization in February 2009, showed nonobstructive disease  with elevated right heart pressures.  Over the past week, she has noted  increasing dyspnea on exertion and lower extremity edema with a 14-pound  weight gain (from 319 up to 333) over the past 3 days.  No change in  chronic two-pillow orthopnea.  She has had PND and denies early satiety.   Last evening, while at rest, she developed 9/9 substernal chest pain and  pressure associated with shortness of breath.  She denies any nausea,  vomiting, lightheadedness, or diaphoresis.  Chest pain persisted  throughout the evening last night, although she was able to fall asleep  with it.  She did wake once during the night and sat at the edge of her  bed for a couple minutes prior to going back to sleep.  When she woke  this morning, she continued to have 9/10 chest  pain.  She had a  scheduled point with [name] at [facility] and presented there complaining of chest pain.  An ECG was  performed showing atrial fibrillation without any acute changes.  Apparently, some lab work is drawn and she was anemic.  She was treated  with nitroglycerin x2, which did not help her chest pain and then was  apparently given morphine, which did help her chest pain.  She was taken  to the [facility] ED where currently, she states her pain is 5/10.  She  currently appears comfortable and in no acute distress.   ALLERGIES:  CODEINE.   HOME MEDICATIONS:  1. Lovaza 2 g b.i.d.  2. Amitriptyline 50 mg nightly.  3. Zoloft 50 mg daily.  4. Coumadin 7.5 mg daily.  5. Levemir 50 units daily.  6.  Proventil b.i.d.  7. Lasix 4 mg daily.  8. Diltiazem 120 mg daily.   FAMILY HISTORY:  Mother died in her 79s following MI with a history of  diabetes and Alzheimer's.  Father died of Alzheimer's at 8.  She has 5  sisters and 2 brothers.  There is a history diabetes and MI in her  siblings.   SOCIAL HISTORY:  She lives in [place] with her daughter and  granddaughter.  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  She denies tobacco, alcohol, or drug use.  She is not routinely  exercising.  She tries to avoid salt, although she eats fast food once a  week and has had a hot dog yesterday.   REVIEW OF SYSTEMS:  Positive for chest pain, shortness breath, dyspnea  on exertion 1-2 pillow orthopnea, and PND over the past week, chronic  lower extremity edema that has been worse in the 3 days.  She has had 14-  pound weight gain over the past 3 days.  She has had some chills over  the past couple of days, although denies fevers.  She has chronic  constipation and history of diabetes.  Otherwise, all systems reviewed  and negative.   PHYSICAL EXAMINATION:  VITAL SIGNS:  Temperature 97.7, heart rate 77,  respirations 20, blood pressure 146/69, pulse ox 97% on 2 L.  GENERAL:  Pleasant white female, in no acute distress.  Awake, alert,  and oriented x3.  HEENT:  Normal with the exception of poor dentition.  NEUROLOGIC:  Grossly intact and nonfocal.  SKIN:  Warm and dry without lesions or masses.  NECK:  Obese.  Difficult to assess JVP.  There is no bruits.  LUNGS:  Respirations are unlabored with crackles approximately a third  of the way out bilaterally.  CARDIAC:  Irregularly irregular S1 and S2.  No S3, S4, or murmurs.  Heart sounds distant.  ABDOMEN:  Obese, semi firm and nontender.  Bowel sounds present x4.  EXTREMITIES:  Warm, dry, and pink with 3+ bilateral lower extremity  edema to the knees.   Chest x-ray is pending.  EKG shows atrial fibrillation rate of 73 in  normal  axis.  No acute ST-T changes.   LABORATORY DATA:  Pending.   ASSESSMENT AND PLAN:  1. Acute-on-chronic diastolic congestive heart failure.  The patient      presents with a 1-week history of progressive dyspnea and a 14-      pound weight gain over the past 3 days.  She does have significant      edema and crackles at bases on exam.  We will  check chest x-ray as      well as BNP.  She admits eating fast food once a week and had a hot      dog yesterday, thus sodium load may be contributing to her      symptoms.  Her blood pressure is up, but not terribly high and her      heart rate is reasonably well controlled.  I will plan to admit and      will cycle her cardiac markers.  We will add Lasix 40 mg IV b.i.d.      and otherwise continue her diltiazem for rate control and blood      pressure management.  Titrate this as needed.  She would likely      benefit from CHF education.  2. Chest pain.  The patient has a history of chest pain dating back to      2005.  She has had 2 cardiac catheterizations since then each      showing nonobstructive CAD.  Her last cath was in February 2009.      We will cycle cardiac markers.  She has had chest pain for roughly      16 hours now.  3. Hypertension.  We are adding Lasix in hope that with diuresis, her      blood pressure will come down some.  Continue diltiazem and adjust      as necessary.  4. Chronic atrial fibrillation is well rate controlled.  We will      continue her Coumadin via pharmacy.  Continue diltiazem.  5. Chronic lower extremity edema.  See #1.  IV Lasix.  6. Diabetes mellitus.  Continue Levemir and add sliding scale insulin.  7. History of chronic obstructive pulmonary disease and asthma and she      is not currently wheezing.  Continue her Proventil.  8. Morbid obesity.  The patient benefit from nutrition counseling both      as in and outpatient.  9. Hyperlipidemia.  Continue Lovaza.  10.?Anemia.  The patient was apparently  anemic on blood work from      [employer].  We do not have that here.  We will check CBC      as well as anemia profile and guaiac stools.      Murray Hodgkins, ANP      [place] Johnsie Cancel, MD, [facility]  Electronically Signed    CB/MEDQ  D:  08/11/2008  T:  08/12/2008  Job:  [id number]

## 2011-04-15 NOTE — Assessment & Plan Note (Signed)
[place]                            CARDIOLOGY OFFICE NOTE   NAME:[name]                    MRN:          [id number]  DATE:09/06/2008                            DOB:          06/22/1946    PRIMARY CARE PHYSICIAN:  Dr. Mercie Eon.   REASON FOR PRESENTATION:  Evaluate the patient with diastolic heart  failure.   HISTORY OF PRESENT ILLNESS:  The patient was hospitalized from August 11, 2008, to September 14, [id number], with diastolic heart failure.  She was  managed with diuretics and had significant weight loss during the  hospitalization.  She had her meds changed slightly.  She was counseled  more strictly on wearing CPAP, which she is doing.  She was counseled on  watching her salt, which she is doing.  She is not weighing herself  daily, and she has been staying at 319 pounds in the morning.  She is  not having any new dyspnea.  She is not having any PND or orthopnea.  She had no palpitations, presyncope, or syncope.   PAST MEDICAL HISTORY:  Heart failure with a well-preserved ejection  fraction (EF 55-60% with moderately elevated peak pulmonary pressures);  nonobstructive coronary artery disease (catheterization on January 20, 2008, the left main is 25% stenosis, LAD proximal 60% stenosis, first  diagonal 40% stenosis, circumflex diffuse irregularities, RCA 25%  stenosis); mild pulmonary artery hypertension; hypertension;  hyperlipidemia; morbid obesity; chronic lower extremity edema; sleep  apnea, now compliant with CPAP; type 2 diabetes mellitus;  gastroesophageal reflux disease; asthma; left arthroscopic knee surgery;  status post cervical diskectomy; cholecystectomy; cataracts;  appendectomy; and atrial fibrillation on chronic Coumadin therapy.   ALLERGIES INTOLERANCE:  CODEINE.   MEDICATIONS:  1. Levemir.  2. Janumet.  3. Vitamin B12.  4. Fish oil.  5. Klor-Con.  6. Duetact 30/2 daily.  7. Cardizem 180 mg  daily.  8. Furosemide 40 mg q.a.m.  9. Proventil.  10.Warfarin.  11.Amitriptyline.  12.Lovaza 2 g b.i.d.   REVIEW OF SYSTEMS:  As stated in the HPI and otherwise negative for  other systems.   PHYSICAL EXAMINATION:  GENERAL:  The patient is in no distress.  VITAL SIGNS:  Blood pressure 126/74, heart rate 87 and regular, body  mass index 56, and weight 324 pounds.  NECK:  No obvious jugular venous distention at 45 degrees, carotid  upstroke brisk and symmetrical, no bruits, no thyromegaly. LUNGS:  Clear  to auscultation bilaterally.  HEART:  PMI not displaced or sustained, S1 and S2 within normal limits,  no S3, no S4, no clicks, no rubs, no murmurs.  ABDOMEN:  Compromised by her size, there is no rebound or guarding, her  bowel sounds are normal, there is no appreciable hepatomegaly or  splenomegaly or midline pulsatile mass.  SKIN:  No rashes, no nodules, chronic venous stasis changes.  EXTREMITIES:  Pulses are 2+, moderate nonpitting bilateral lower  extremity edema.  NEURO:  Grossly intact.   EKG; atrial fibrillation, rate 87, axis within normal limits, intervals  within normal limits, and no acute ST-T wave changes.   ASSESSMENT  AND PLAN:  1. Dyspnea.  The patient's dyspnea is improved.  I think she is a      little more compliant than she had been in the past.  She needs      continued blood pressure.  Control fluid management and salt      restriction.  2. Morbid obesity.  I told her she absolutely needs to lose weight and      she did lose 8 pounds which may have been fluid.  Hopefully, she      will restrict her calories.  3. Lower extremity edema.  We are treating this conservatively.  4. Hypertension.  Blood pressure is controlled and she will continue      the meds as listed.  5. Atrial fibrillation.  She is on Coumadin.  She has had reasonable      rate control.  No further evaluation is warranted.  6. Followup.  I will see her back in 6 months or sooner if  needed.     Minus Breeding, MD, [facility]  Electronically Signed    JH/MedQ  DD: 09/06/2008  DT: 09/06/2008  Job #: [id number]   cc:   Mercie Eon, NP

## 2011-04-15 NOTE — Assessment & Plan Note (Signed)
[facility] OFFICE NOTE   NAME:[name], [name]                    MRN:          [id number]  DATE:01/17/2008                            DOB:          Jul 09, 1946    PRIMARY:  Dr. Morrie Sheldon.   REASON FOR CONSULTATION:  Evaluate patient with chest discomfort.   HISTORY OF PRESENT ILLNESS:  The patient is now 65 years old.  She has a  history of nonobstructive coronary disease and paroxysmal atrial  fibrillation.  We saw her last in 2006 in consultation for the atrial  fibrillation.   The patient describes a exertional chest discomfort.  This has been  going on, she says, from months, but it is getting worse.  It is  happening with less exertion and more intensity.  She describes as  substernal discomfort that radiates down her left arm when she does  things such as walk up a set of stairs or tries to mop the floor.  She  has to stop what she is doing and it takes several minutes for the  discomfort to go away.  If she restarts something like this it will  usually return.  It can be 8/10 in intensity.  It is nagging, tight, and  sharp discomfort.  She gets short of breath.  She gets somewhat sweaty.  She gets presyncopal.  She does not have this at rest.  She does  describe increasing dyspnea with exertion but not any resting shortness  of breath and has not had any PND or orthopnea.  She is also noted to be  in atrial fibrillation.  She has had this paroxysmally.  She does feel  her heart racing at times.  She is not limited by this and has not had  any presyncope or syncope.   PAST MEDICAL HISTORY:  Nonobstructive coronary disease (November 06, 2004  30-40% first diagonal stenosis.  EF 55%), obstructive sleep apnea (she  is currently not wearing her CPAP because she is out of filters), non-  insulin diabetes mellitus, obesity, hypertension, hyperlipidemia,  gastroesophageal reflux disease, asthma.   PAST  SURGICAL HISTORY:  Left knee arthroscopic surgery June 2005,  cervical diskectomy, cataracts surgery, cholecystectomy, carpal tunnel  surgery, appendectomy, cesarean section, umbilical hernia repair.   ALLERGIES:  CODEINE.   MEDICATIONS:  Lovaza 2 grams b.i.d., Actos 30 mg daily, amitriptyline 50  mg q.h.s., glimepiride 6 mg daily, Simcor 1000/20 daily, sertraline 50  mg daily, furosemide 20-40 mg daily, Coumadin, Zyrtec 10 mg daily,  Levemir (not clear the dose).   SOCIAL HISTORY:  The patient lives with her daughter and granddaughter.  She does not smoke cigarettes.  Does not drink alcohol.   FAMILY HISTORY:  Contributory for her mother having myocardial function  her 50s.   REVIEW OF SYSTEMS:  As stated in the HPI and negative for other systems.   PHYSICAL EXAMINATION:  The patient is in no distress.  Blood pressure 140/65, heart rate 86 and regular, weight 322 pounds,  body mass index 57.  HEENT:  Eyelids unremarkable, pupils equal, round, reactive, fundi not  visualized, oral mucosa unremarkable.  NECK:  No jugular distention, waveform within normal limits, carotid  upstroke brisk and symmetric, no bruits, thyromegaly.  Lymphatics no cervical, axillary, inguinal.  LUNGS:  Clear to auscultation bilaterally.  BACK:  No costovertebral his.  CHEST:  Unremarkable.  HEART:  PMI not displaced or sustained, S1-S2 within normal limits, no  S3, no S4, no clicks, rubs, no murmurs.  ABDOMEN:  Morbidly obese, positive bowel sounds.  Normal in frequency  and pitch, no bruits, rebound, guarding or midline pulsatile mass.  No  hepatomegaly, no splenomegaly.  SKIN:  No rashes, no nodules.  EXTREMITIES:  2+ pulses, moderate bilateral nonpitting edema in the  lower extremities, chronic venous stasis changes.  NEURO:  Oriented to place, time, cranial nerves II-XII grossly intact,  motor grossly intact.   EKG (done Dr. Tawanna Sat office on February 9) atrial fibrillation, axis  within normal  limits, intervals within normal limits, no acute ST-T wave  changes.   ASSESSMENT/PLAN:  1. Chest discomfort, the patient's discomfort is highly suspicious of      exertional angina.  It would be unstable based on the fact that it      is increasing in intensity and frequency.  Therefore, she is      cardiac catheterization.  I think pretest probability is too high      to rely on stress perfusion imaging.  Further evaluation based on      these results.  I will give her a low-dose beta blocker, metoprolol      25 mg daily extended release.  2. Atrial fibrillation.  She is on Coumadin.  Once we are done with      this evaluation will judge rate control with a Holter if she is      persistently in the a fib.  She left, for Coumadin of course for      the catheterization.  3. Lower extremity swelling and dyspnea.  Will evaluate this with an      echocardiogram and right heart catheterization.  4. Diabetes.  She will continue medications as listed.  5. Dyslipidemia.  Per Dr. Laurance Flatten with goal LDL less than 70 and HDL      greater than 50.  6. Obesity.  She absolutely needs to lose weight.  Unfortunately she      has been going the opposite direction.  7. Sleep apnea.  She needs to get back on the CPAP.  This is probably      contributing to some      of the findings and complaints.  8. Follow-up will be at the time of catheterization.     Minus Breeding, MD, [facility]  Electronically Signed    JH/MedQ  DD: 01/17/2008  DT: 01/17/2008  Job #: [id number]   cc:   Chipper Herb, M.D.

## 2011-04-15 NOTE — Discharge Summary (Signed)
[name], [name]             ACCOUNT NO.:  [id number]   MEDICAL RECORD NO.:  [id number]          PATIENT TYPE:  INP   LOCATION:  2040                         FACILITY:  [facility]   PHYSICIAN:  Minus Breeding, MD, FACCDATE OF BIRTH:  Jun 03, 1946   DATE OF ADMISSION:  08/11/2008  DATE OF DISCHARGE:  08/14/2008                               DISCHARGE SUMMARY   PRIMARY CARDIOLOGIST:  Minus Breeding, MD, [facility].   PRIMARY CARE Finn Altemose:  Mercie Eon, NP, Debe Coder, [facility].   DISCHARGE DIAGNOSIS:  Acute-on-chronic diastolic congestive heart  failure.   SECONDARY DIAGNOSES:  1. Nonobstructive coronary artery disease.  2. Pulmonary arterial hypertension.  3. Hypertension.  4. Hyperlipidemia.  5. Morbid obesity.  6. Chronic lower extremity edema.  7. Dietary noncompliance.  8. Obstructive sleep apnea with CPAP noncompliance.  9. Type 2 diabetes mellitus.  10.Gastroesophageal reflux disease.  11.Asthma.  12.Permanent atrial fibrillation on chronic Coumadin therapy.   ALLERGIES:  CODEINE.   PROCEDURES:  None.   HISTORY OF PRESENT ILLNESS:  A 65 year old female with history of  chronic diastolic congestive heart failure and nonobstructive CAD who  has chronic dyspnea.  Over approximately 1 week prior to admission, she  noted worsening lower extremity edema as well as a 14-pound weight gain  over 3 days prior to admission.  The evening prior to admission, she  developed substernal chest discomfort that persisted throughout the  night and the next morning prompting her to see her primary care  Trenise Turay on August 11, 2008.  ECG was performed in office showing no  acute changes and she was treated with nitroglycerin and subsequently  Morphine with reduction in chest discomfort.  She was taken to [facility] for further evaluation.  In the ED she was  comfortable although volume overloaded.  She was admitted for further   evaluation.   HOSPITAL COURSE:  Ms. Gerth is placed on intravenous Lasix with brisk  diuresis and symptomatic improvement.  She was maintained on oral  diltiazem for her chronic atrial fibrillation and this has been rate  controlled.  Her weight has dropped to 144.2 kg down from 151.3 kg on  admission.  Her Lasix was converted to p.o. Lasix on August 13, 2008  and this morning she has been feeling well and ambulating without  difficult.  She will be discharged home today in good condition.   Of note, Ms. Hinnenkamp has been anemic throughout her hospitalization  with a hemoglobin of 9.2 and hematocrit 27.9.  Her anemia is normocytic  and normochromic.  We recommended that she have outpatient followup with  Mercie Eon for additional evaluation of her anemia.  Her last  colonoscopy was 2-3 years ago.   DISCHARGE LABS:  Hemoglobin 9.2, hematocrit 27.9, WBC 7.6, and platelets  168.  MCV 82.8, sodium 136, potassium 3.7, chloride 100, CO2 28, BUN 16,  creatinine 0.84, glucose 147, and INR 1.6 this morning.  Total bilirubin  0.9, alkaline phosphatase 173, AST 28, ALT 23, and albumin 3.2.  Cardiac  markers negative x3.  Total cholesterol 155, triglycerides 96, HDL 39,  LDL 97, calcium 8.7, magnesium 1.9, BNP 150.0, serum iron 38, TIBC 378,  vitamin B12 731, ferritin 71, TSH 4.041, T4 7.8, and folate 15.9.   DISPOSITION:  The patient has been discharged home today in good  condition.   FOLLOWUP PLANS AND APPOINTMENTS:  We arranged for follow up with Dr.  Percival Spanish in [facility] on September 06, 2008, at 11:30 a.m.  We  recommended that she followup with Mercie Eon, NP, in the next 1-2  weeks.  We have counseled her on the importance of sodium restriction as  well as daily weights and symptom reporting.   DISCHARGE MEDICATIONS:  1. Cardizem CD 180 mg daily.  2. Lasix 40 mg daily.  3. Coumadin 7.5 mg nightly.  4. Levemir 50 units daily.  5. Lovaza 2 grams b.i.d.  6.  Amitriptyline 50 mg nightly.  7. Zoloft 50 mg daily.  8. Proventil 90 mcg per spray 2 puffs q.6 hours p.r.n.  9. Janumet 50/500 mg daily.  10.Duetact 30/2 mg daily.  11.Meloxicam 15 mg daily.  12.Vitamin B12 1000 mg daily.  13.Propoxyphene APAP 10/650 q.6 hours p.r.n.  14.Nitroglycerin 0.4 mg sublingual p.r.n. chest pain.  15.K-Dur 20 mEq daily.   OUTSTANDING LAB STUDIES:  None.   DURATION OF DISCHARGE/ENCOUNTER:  Forty five minutes including physician  time.      Murray Hodgkins, ANP      Minus Breeding, MD, [facility]  Electronically Signed    CB/MEDQ  D:  08/14/2008  T:  08/15/2008  Job:  [id number]   cc:   Mercie Eon, NP

## 2011-04-18 NOTE — Op Note (Signed)
NAME:  [name], [name] NO.:  [id number]   MEDICAL RECORD NO.:  [id number]          PATIENT TYPE:  OIB   LOCATION:  4708                         FACILITY:  [facility]   PHYSICIAN:  Kathalene Frames. Mayer Camel, M.D.   DATE OF BIRTH:  02/12/1946   DATE OF PROCEDURE:  08/22/2005  DATE OF DISCHARGE:  08/23/2005                                 OPERATIVE REPORT   PREOPERATIVE DIAGNOSIS:  Left knee chondromalacia.   POSTOPERATIVE DIAGNOSES:  1.  Left knee chondromalacia.  2.  Medial femoral condyle grade 3 anterior horn lateral meniscal tear.  3.  Chondromalacia of the trochlea grade 3.  4.  Small cartilaginous loose bodies.   PROCEDURE:  1.  Left knee arthroscopic debridement of chondromalacia to the flap tears -      - medial femoral condyle and trochlea.  2.  Removal of anterior horn lateral meniscal tear.  3.  Removal of multiple small cartilaginous loose bodies.   SURGEON:  Kathalene Frames. Mayer Camel, M.D.   FIRST ASSISTANT:  __________ , PA-C.   ANESTHETIC:  General endotracheal.   ESTIMATED BLOOD LOSS:  Minimal.   FLUID REPLACEMENT:  1 L crystalloid.   DRAINS PLACED:  None.   TOURNIQUET TIME:  None.   INDICATIONS FOR PROCEDURE:  This is a 65 year old woman with significant  left knee pain, catching and popping; has had a previous arthroscopic  debridement with fairly good results. She weighs about 320 pounds, and is  definitely on her way to  developing significant arthritis secondary to her  obesity; but, because of recurrent mechanical catching, popping and pain  desires elective arthroscopic evaluation and treatment of the left knee.  Risks and benefits of the surgery discussed with the patient, and all  questions answered.   DESCRIPTION OF PROCEDURE:  The patient identified by armband, taken the  operating room at [facility]. Appropriate anesthetic monitors  were attached and general endotracheal anesthesia induced with the patient  in the supine position.  Lateral post applied to the table and left the lower  extremity prepped and draped in sterile fashion -- from the ankle to the mid  thigh. Using a #11 blade, standard inferomedial and inferolateral  peripatellar portals were made, after first localizing the portals with a  spinal needle. Diagnostic arthroscopy revealed significant chondromalacia of  the trochlea, with grade 3 flap tears.  This was debrided back to stable  margins with a 3.5 gator sucker shaver and A 4.2 great white sucker shaver -  - as was the medial femoral condyle. The old medial meniscal tear appeared  to be stable. The ACL and the PCL were intact. There was an anterior horn  lateral meniscal tear that was debrided back to a stable margin, with a 4.2  great white sucker shaver; after photographic documentation made of it.  The  gutters were cleared medially and laterally.  The knee was then irrigated  out with normal saline solution. The arthroscopic instruments were removed  and a dressing of Xerofoam 4x4 dressing sponges, Webril and an Ace wrap  applied. The patient was awakened and taken to  the recovery room without  difficulty.      Kathalene Frames. Mayer Camel, M.D.  Electronically Signed     FJR/MEDQ  D:  08/22/2005  T:  08/23/2005  Job:  [id number]

## 2011-04-18 NOTE — Cardiovascular Report (Signed)
NAMEMarland [name]  RODOLFO, YOAKAM NO.:  [id number]   MEDICAL RECORD NO.:  [id number]          PATIENT TYPE:  INP   LOCATION:  2002                         FACILITY:  [facility]   PHYSICIAN:  Ernestine Mcmurray, M.D. LHCDATE OF BIRTH:  [birth year]   DATE OF PROCEDURE:  DATE OF DISCHARGE:  11/06/2004                              CARDIAC CATHETERIZATION   REFERRING PHYSICIAN:  Dr. Kirk Ruths.   PROCEDURES PERFORMED:  1.  Left heart catheterization with selective coronary angiography.  2.  Ventriculography.   DIAGNOSIS:  No obstructive coronary artery disease.   INDICATION:  The patient is a 65 year old female admitted with substernal  chest pressure.  The patient was also found to be anemic.  She has been  referred for cardiac catheterization, despite negative cardiac enzymes  markers, to rule out coronary artery disease.   DESCRIPTION OF PROCEDURE:  After informed consent was obtained, the patient  was brought to the catheterization laboratory.  The right groin was  sterilely prepped and draped.  A 6-French arterial sheath was placed using a  modified Seldinger technique.  Standard JL4 and JR4 catheters were used for  coronary angiography.  A 6-French angled pigtail catheter was used for  coronary angiography.  No complications were encountered during the  procedure.  Ventriculography was performed in single-plane RAO projection  using power injection of contrast.  At termination of procedure, all  catheters and sheaths were removed, and the patient was brought back to the  holding area.   FINDINGS:   HEMODYNAMICS:  Left ventricular pressure 143/75 mmHg.  Aortic pressure  143/66 mmHg.   VENTRICULOGRAPHY:  Ejection fraction 55% with no mitral regurgitation.   SELECTIVE CORONARY ANGIOGRAPHY:  1.  The left main coronary artery was free of flow-limiting disease.  2.  The left anterior descending artery was a large-caliber vessel that gave      rise to several diagonal  branches.  The first diagonal branch had a      proximal 30% to 40% stenosis.  3.  The circumflex coronary artery was a large-caliber vessel with no      evidence of flow-limiting disease.  4.  The right coronary artery was free of flow-limiting disease.   RECOMMENDATION:  Angiographic images were reviewed and discussed with Dr.  Stanford Breed.  Chest pain appears to be noncardiac in origin.  The patient will  receive followup with Dr. Stanford Breed in the office and further evaluation of  her anemia.      Luvenia Heller   GED/MEDQ  D:  11/06/2004  T:  11/07/2004  Job:  [id number]   cc:   Kirk Ruths, M.D. [facility]

## 2011-04-18 NOTE — Consult Note (Signed)
NAME:  [name], [name] NO.:  [id number]   MEDICAL RECORD NO.:  [id number]                  PATIENT TYPE:   LOCATION:                                       FACILITY:   PHYSICIAN:  R. Garfield Cornea, M.D.              DATE OF BIRTH:  [birth year]   DATE OF CONSULTATION:  11/15/2002  DATE OF DISCHARGE:                                   CONSULTATION   REASON FOR CONSULTATION:  Abnormal abdominal CT, elevated alkaline  phosphatase.   HISTORY OF PRESENT ILLNESS:  The patient is a 65 year old Caucasian female  patient of Dr. Mallie Mussel who presents today for the above-stated symptoms.  She  was found to have elevated alkaline phosphatase which has been followed by  Dr. Melina Copa.  Most recent blood work reveals on September 29, 2002 an alkaline  phosphatase was 144, albumin 3.4; other LFT parameters were normal.  This  was rechecked on October 11, 2002 and alkaline phosphatase was again 142  with albumin 3.9; remaining LFTs normal.  The patient was seen by Dr. Laural Golden  previously and was noted to have elevated alkaline phosphatase at that time;  this was in 1994.  Her alkaline phosphatase was 157 at that time.  GGT was  slightly elevated at 55.  She had an ultrasound at that time which revealed  a small cyst within the right lobe of the liver that measured  2 cm; otherwise normal.  She also had an EGD at that time to further  evaluate epigastric pain and possible melena.  This revealed a small sliding  hiatal hernia, mild gastritis, with duodenal gastric bowel reflux.  CLOtest  was negative.   The patient also had a CT of the abdomen and pelvis recently as ordered by  Dr. Mallie Mussel.  This revealed normal liver size.  There was an oblong  hypointense area of abnormality extending from the fissure for the  ligamentum venosum measuring 2.6 x 1.5 cm.  This was unchanged from previous  CT in 1996.  The spleen was normal; bile ducts normal.  There was a small  focal area of  thickening involving the sigmoid colon felt to be due to  adherent stool or focal contraction; however, endoscopy was recommended.  There were degenerative changes of the spine and subcutaneous edema along  the back.   The patient presents with complaint of right upper quadrant/right flank pain  made worse by movement.  It is not exacerbated by eating.  She has had this  ever since her back injury in September 2003.  She complains of dysphagia  ever since her neck surgery over one year ago.  She feels like food is  getting caught in her mid esophageal region.  Denies any odynophagia,  heartburn, nausea or vomiting, constipation, melena, rectal bleeding.  She  has been having three to five loose stools daily, particularly post  prandially.  This has been notable  after having the flu recently.  She was  also started on Glucophage four weeks ago, previously had diet-controlled  diabetes.  She denies any recent antibiotic use.  She denies any pruritus.   MEDICATIONS:  1. Triamterene/HCTZ 37.5/25 mg q.d.  2. Zoloft 50 mg q.d.  3. Glucophage 500 mg q.d.   ALLERGIES:  CODEINE.   PAST MEDICAL HISTORY:  1. Non-insulin-dependent diabetes mellitus, recently started on oral     medication.  2. Hypertension.  3. Depression.  4. She currently has a fractured vertebra and bulging disk in the lumbar     region and is wearing a brace.   PAST SURGICAL HISTORY:  1. Bilateral cataract extraction.  2. Two neck surgeries for pinched nerves - the last one more than one year     ago.  3. Right wrist carpal tunnel surgery.  4. Umbilical hernia repair.  5. Cholecystectomy for gallstones.  6. Appendectomy.  7. Cesarean section.   FAMILY HISTORY:  Mother had colon cancer diagnosed at age 63, status post  resection.  She eventually died of Alzheimer's disease.  No history of liver  disease in the family.   SOCIAL HISTORY:  She is widowed.  She has one child.  She is disabled.  She  has never been a  smoker.  Denies any alcohol use.   REVIEW OF SYSTEMS:  Please see HPI for GI.  GENERAL:  Denies any weight  loss.  GENITOURINARY:  Denies any dysuria, hematuria.  CARDIOPULMONARY:  Denies any chest pain or shortness of breath.   PHYSICAL EXAMINATION:  VITAL SIGNS:  Weight 287, height 5 feet 7 inches.  Blood pressure 132/64, pulse 74.  GENERAL:  Pleasant, obese Caucasian female in no acute distress.  She is  accompanied by her sister.  SKIN:  Warm and dry, no jaundice.  HEENT:  Pupils equal, round, and reactive to light.  Conjunctivae are pink,  sclerae nonicteric.  Oropharyngeal mucosa moist and pink; no lesions,  erythema, or exudate.  NECK:  No lymphadenopathy, thyromegaly, or carotid bruits.  CHEST:  Lungs clear to auscultation.  CARDIAC:  Regular rate and rhythm, normal S1 and S2.  No murmurs, rubs, or  gallops.  ABDOMEN:  Positive bowel sounds, obese but symmetrical.  Soft.  She has  moderate tenderness along the entire right abdomen and right flank area with  palpation.  This extends with palpation of the right back area as well.  No  organomegaly or masses appreciated.  EXTREMITIES:  No edema.   IMPRESSION:  The patient is a 65 year old lady with at least a 10-year  history of elevated alkaline phosphatase.  She has had documented stability  of her alkaline phosphatase, noting that it was in the 150s back in 1994 and  is in the 140s currently.  Other LFT parameters have been within normal  limits.  She has a normal-appearing liver except for a stable low-density  lesion; previous ultrasound revealed a cyst.  She denies any pruritus.  Given elevated alkaline phosphatase this could be some liver cholestasis or  even from bone or intestinal sources.  We need to check a GTT to see if we  can determine whether it is a hepatic source.  Would also check an AMA to  rule out primary bilary cirrhosis.  She has evidence of possible sigmoid thickening on recent CT of the abdomen  and  pelvis.  Given her family history of colon cancer would recommend  colonoscopy at this time to further evaluate.  Dysphagia of one year's duration, temporarily related to previous cervical  neck surgery.  I discussed with the patient today that we can check for  esophageal web ring or stricture at the time of endoscopy if she would like.  She would like to proceed with this as well.   PLAN:  1. Colonoscopy with EGD with dilatation in the near future.  2. Will check an AMA and GTT at time of endoscopy.  3. Further recommendations to follow.   I would like to thank Dr. Mallie Mussel for allowing [place] to take part in the care of  this patient.     Neil Crouch, Richelle Ito, M.D.    LL/MEDQ  D:  11/15/2002  T:  11/16/2002  Job:  [id number]   cc:   Trellis Moment  [address]  [place]  [place] [address]  Fax: [phone]

## 2011-04-18 NOTE — Op Note (Signed)
NAME:  [name], [name]                       ACCOUNT NO.:  [id number]   MEDICAL RECORD NO.:  [id number]                   PATIENT TYPE:  OIB   LOCATION:  5018                                 FACILITY:  [facility]   PHYSICIAN:  Kathalene Frames. Mayer Camel, M.D.                DATE OF BIRTH:  1946-08-22   DATE OF PROCEDURE:  05/21/2004  DATE OF DISCHARGE:  05/22/2004                                 OPERATIVE REPORT   PREOPERATIVE DIAGNOSES:  Left knee medial meniscal tear with chondromalacia.   POSTOPERATIVE DIAGNOSES:  Left knee medial meniscal tear with  chondromalacia, loose bodies.   OPERATION PERFORMED:  Left knee arthroscopic removal of loose bodies,  removal of posterior horn medial meniscal tear, and debridement of  chondromalacia of the trochlea grade 3 with flap tears, medial femoral  condyle grade 3 with flap tears focal and lateral femoral condyle with grade  3 flap tears focal.   SURGEON:  Kathalene Frames. Mayer Camel, M.D.   ASSISTANT:  None.   ANESTHESIA:  General endotracheal.   ESTIMATED BLOOD LOSS:  Minimal.   FLUIDS REPLACED:  700 mL crystalloids.   TOURNIQUET TIME:  None.   INDICATIONS FOR PROCEDURE:  The patient is a 65 year old woman with MRI  proven posterior horn medial meniscal tear, symptomatic for many months who  has failed conservative treatment and desires elective removal of same.   DESCRIPTION OF PROCEDURE:  The patient was identified by arm band and taken  to the operating room at [facility] main hospital.  Appropriate anesthetic  monitors were attached.  General endotracheal was induced with the patient  in the supine position.  Lateral post applied to the table.  The left lower  extremity was prepped and draped in the usual sterile fashion from the ankle  to the midthigh.  Using a #11 blade standard inferomedial and inferolateral  peripatellar portals were made allowing introduction of the arthroscope  through the inferolateral portal, the outflow through the inferomedial  portal and diagnostic arthroscopy was undertaken.  Grade 3 chondromalacia  with flap tears of the trochlea identified and debrided.  Same on the medial  side of the knee with some focal grade 3 chondromalacia of the medial  femoral condyle identified.  The posterior horn had a horizontal split flap  tear with a parrot beak component and this was removed piecemeal with the  straight biters and the 4.2 Great White sucker shaver.  The articular  cartilage of the medial tibial condyle appeared to be in good condition.  The cruciate ligaments were intact on the lateral side.  The articular  cartilage looked to be in good shape as did the lateral meniscus.  The  knee was then irrigated out with normal saline solution.  The arthroscopic  instruments were removed and a dressing of Xeroform, 4 x 4 dressing sponges,  Webril and Ace wrap was applied.  The patient was awakened and taken to the  recovery room without difficulty.                                               Kathalene Frames. Mayer Camel, M.D.    Alphonsus Sias  D:  05/21/2004  T:  05/22/2004  Job:  [id number]

## 2011-04-18 NOTE — Op Note (Signed)
NAME:  [name], [name]                       ACCOUNT NO.:  [id number]   MEDICAL RECORD NO.:  [id number]                   PATIENT TYPE:  AMB   LOCATION:  DAY                                  FACILITY:  [facility]   PHYSICIAN:  R. Garfield Cornea, M.D.              DATE OF BIRTH:  09-10-1946   DATE OF PROCEDURE:  11/22/2002  DATE OF DISCHARGE:                                 OPERATIVE REPORT   PROCEDURE:  Diagnostic esophagogastroduodenoscopy followed by diagnostic  colonoscopy.   ENDOSCOPIST:  Cristopher Estimable. Rourk, M.D.   INDICATIONS FOR PROCEDURE:  The patient is a 65 year old lady who was  recently found to have an abnormal appearing sigmoid on CT scan recently.  Positive family history of colorectal neoplasia. Never had her lower GI  tract evaluated. Also, she has esophageal dysphagia.  EGD and colonoscopy  are now being done.  This approach has been discussed with the patient  previously, and again today at the beside.  The potential risks, benefits,  and alternatives have been reviewed; and questions answered.  She is  agreeable.  ASA 2.   DESCRIPTION OF PROCEDURE:  O2 saturation, blood pressure, pulse and  respirations were monitored throughout the entire procedure.   CONSCIOUS SEDATION FOR BOTH PROCEDURES:  Versed 4 mg IV and Demerol 75 mg IV  in divided doses.   INSTRUMENT:  Olympus video chip adult gastroscope and colonoscope.   EGD FINDINGS:  Examination of the tubular esophagus revealed no mucosal  abnormalities.  The EG junction was easily traversed.  The tubular esophagus  was widely patent.   STOMACH:  The gastric cavity was empty.  It insufflated well with air.  A  thorough examination of the gastric mucosa including a retroflex view of the  proximal stomach and esophagogastric junction demonstrated no abnormalities.  The pylorus was patent and easily traversed.   DUODENUM:  The bulb and the second portion appeared normal.   THERAPY/DIAGNOSTIC MANEUVERS:  None.   The patient tolerated the procedure well and was prepared for colonoscopy.   COLONOSCOPY FINDINGS:  Digital rectal exam revealed no abnormalities.   ENDOSCOPIC FINDINGS:  The prep was good.   RECTUM:  Examination of the rectal mucosa including the retroflex view of  the anal verge revealed no abnormalities.   COLON:  The colonic mucosa was surveyed from the rectosigmoid junction  through the left transverse and right colon to the area of the appendiceal  orifice, ileocecal valve, and cecum.  These structures were well seen and  photographed.  The colonic mucosa to the cecum appeared normal.   From the level of the cecum and ileocecal valve the scope was slowly and  cautiously withdrawn.  All previously mentioned mucosal surfaces were again  seen; and no abnormalities, again, were observed.  The patient tolerated the procedures well and was reacted in endoscopy   EGD IMPRESSION:  Normal esophagus, stomach, and duodenum through the second  portion.   COLONOSCOPY IMPRESSION:  1. Normal rectum.  2. Normal colon.   RECOMMENDATIONS:  1. Repeat colonoscopy in 5 years.  2. Since there was no structural lesion to account for her dysphagia, we     will go ahead and obtain a barium pill esophagram in the very near     future.  3. As far as her elevated alkaline phosphatase, a serum GGT in a.m. were     drawn and are pending today.  4. Further recommendations to follow.                                               Bridgette Habermann, M.D.    RMR/MEDQ  D:  11/22/2002  T:  11/22/2002  Job:  [id number]   cc:   Trellis Moment  [address]  [place]  [place] [address]  Fax: [phone]

## 2011-04-18 NOTE — H&P (Signed)
NAME:  [name], CAIRNES NO.:  [id number]   MEDICAL RECORD NO.:  [id number]         PATIENT TYPE:  INP   LOCATION:                               FACILITY:  [facility]   PHYSICIAN:  Kirk Ruths, M.D. LHCDATE OF BIRTH:  1946/07/18   DATE OF ADMISSION:  11/05/2004  DATE OF DISCHARGE:                                HISTORY & PHYSICAL   HISTORY OF PRESENT ILLNESS:  The patient is a very pleasant 65 year old  female with past medical history of diabetes mellitus, hypertension,  hyperlipidemia, gastroesophageal reflux disease, and asthma, who presents  with chest pain that is new in onset.  She has no prior cardiac history.  Over the past one month, she complains of chest tightness that occurs with  exertion and is relieved with rest.  It radiates to the back and to the left  arm.  There is associated shortness of breath, nausea, and diaphoresis.  The  pain is not pleuritic in position or nausea, related to food.  Her most  recent episode occurred this morning while walking to the bank and lasted  for approximately 10 minutes.  She was seen at [facility]  and Beatrix Fetters, M.D. asked [place] to further evaluate.   MEDICATIONS:  1.  Combivent one puff b.i.d.  2.  Zoloft 50 mg p.o. daily.  3.  Aspirin 81 mg p.o. daily.  4.  Fortimet 1000 mg daily.  5.  Vytorin 10/40 mg daily.  6.  Advair 500/50 mg p.o. b.i.d.  7.  Hydrochlorothiazide 25 mg p.o. daily.  8.  Benicar 20 mg p.o. daily.  9.  Amaryl 4 mg p.o. daily.  10. Avandia 2 mg p.o. daily.  11. Piroxicam 20 mg p.o. q.h.s.  12. Darvocet as needed.   ALLERGIES:  CODEINE.   FAMILY HISTORY:  Positive for coronary artery disease in her mother.   PAST MEDICAL HISTORY:  Significant for diabetes mellitus, hypertension, and  hyperlipidemia.  She has a history of asthma.  There is no history of  coronary artery disease, although, she does state that she has had a murmur.  She has had nephrolithiasis as well as  gastroesophageal reflux disease.  She  has had prior neck surgery x2.  She has had carpal tunnel surgery as well as  cataract surgery.  She has had a prior cholecystectomy, cesarean section,  and appendectomy.  She has had prior left knee surgery.   SOCIAL HISTORY:  She does not smoke, nor does she consume alcohol.   REVIEW OF SYSTEMS:  She denies any headaches, fevers, or chills.  There is  no productive cough or hemoptysis.  There is no dysphagia, odynophagia,  melena, or hematochezia.  There is no dysuria or hematuria.  There no rash  or seizure activity.  There is no orthopnea, PND, or pedal edema.  She does  complain of pain in her legs with ambulation bilaterally.  The remaining  systems are negative.   PHYSICAL EXAMINATION:  VITAL SIGNS:  Blood pressure 189/80, pulse 61.  GENERAL:  She is well-developed and obese.  She is in no acute distress.  SKIN:  Warm and dry.  She is not depressed.  There is no peripheral  clubbing.  HEENT:  Unremarkable with normal eyelids.  NECK:  Supple with normal upstrokes bilaterally.  I cannot appreciate  bruits.  There is no jugular venous distention and no thyromegaly noted.  CHEST:  Clear to auscultation, normal expansion.  HEART:  Irregular rate and rhythm with normal S1 and S2.  There is a A999333  systolic ejection murmur at the left sternal border.  S2 is preserved.  There is a A999333 systolic murmur at the apex as well.  ABDOMEN:  Nontender, distended, positive bowel sounds, no  hepatosplenomegaly, no masses appreciated.  There is no abdominal bruit.  She has 2+ femoral pulses bilaterally and no bruit.  EXTREMITIES:  Varicosities, but I cannot appreciate edema.  She has 2+  dorsalis pedis pulses bilaterally.  NEUROLOGY:  Grossly intact.   Her electrocardiogram shows a normal sinus rhythm at a rate of 55.  The axis  is normal.  There are no significant ST changes noted.   ADMISSION DIAGNOSES:  1.  Unstable angina.  2.  Diabetes mellitus.   3.  Hypertension.  4.  Hyperlipidemia.  5.  History of asthma.   PLAN:  The patient presents with chest pain that is extremely concerning.  We will plan to admit and rule out myocardial infarction with serial  enzymes.  I will treat with aspirin, heparin, low dose beta blockade.  We  will need to follow her closely for heart rate and any exacerbation of  asthma symptoms, as well as a statin.  The risks and benefits of  catheterization have been discussed and the patient agrees to proceed.  We  will also check an echocardiogram given her murmurs.  She will need risk  factor modification including statin therapy with a goal LDL of 70 as well  as close follow-up of her diabetes mellitus.        ___________________________________________  Kirk Ruths, M.D. [facility]    BC/MEDQ  D:  11/05/2004  T:  11/05/2004  Job:  [id number]

## 2011-04-18 NOTE — Consult Note (Signed)
NAME:  JERE, BRENNICK NO.:  [id number]   MEDICAL RECORD NO.:  [id number]          PATIENT TYPE:  OIB   LOCATION:  4708                         FACILITY:  [facility]   PHYSICIAN:  Minus Breeding, M.D.   DATE OF BIRTH:  08-26-1946   DATE OF CONSULTATION:  08/22/2005  DATE OF DISCHARGE:  08/23/2005                                   CONSULTATION   REASON FOR CONSULTATION:  Evaluate patient with atrial fibrillation.   HISTORY OF PRESENT ILLNESS:  The patient is a pleasant 65 year old seen by  Dr. Stanford Breed before. She has non-obstructive coronary disease and well  preserved ejection fraction. She had a left arthroscopic knee surgery today.  Postoperatively, she was noted to be in atrial fibrillation with a  controlled ventricular rate. She is not symptomatic from this. She has had  no problems with her blood pressure. She has not noticed any palpitations.  At home, she is limited by knee pain. However, she does not have any  problems with chest pain and has never had any palpitations or pre-syncope  or syncope. She describes some dyspnea with exertion which is chronic, and  most likely related to asthma. She sleeps on pillows. She has some mild  lower extremity swelling at night but all of this is baseline.   PAST MEDICAL HISTORY:  1.  Non-obstructive coronary disease (November 06, 2004 - 30% to 40% first      diagonal stenosis. Ejection fraction of 55%).  2.  Obstructive sleep apnea.  3.  Noninsulin-dependent diabetes mellitus.  4.  Obesity.  5.  Hypertension.  6.  Hyperlipidemia.  7.  Gastroesophageal reflux disease.  8.  Asthma.   PAST SURGICAL HISTORY:  1.  Left knee arthroscopy June 2005.  2.  Cervical diskectomy.  3.  Cataracts.  4.  Cholecystectomy.  5.  Carpal tunnel surgery.  6.  Appendectomy.  7.  Cesarean section.   ALLERGIES:  CODEINE.   CURRENT MEDICATIONS:  Niaspan 500 mg q.h.s., Advair, metformin 1,000 mg  b.i.d., Diclofenac 15 mg b.i.d.,  propoxyphene APAP, __________ 50 mg p.r.n.,  Protonix 40 mg p.r.n., Avandia 4 mg q. p.m., aspirin 81 mg q. day,  Combivent, Benicar 40 mg q. day, glimepiride 6 mg q. day.   SOCIAL HISTORY:  The patient lives with her daughter and granddaughter in  [place]. 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 She is a widow. She  does not smoke cigarettes or drink alcohol.   FAMILY HISTORY:  Noncontributory.   REVIEW OF SYSTEMS:  As stated in the HPI. Positive for nocturia with 30  pound weight gain in the last year. Negative for other systems.   PHYSICAL EXAMINATION:  GENERAL:  The patient is in no distress.  VITAL SIGNS:  Blood pressure 157/56, heart rate 75 and irregular. Afebrile,  95% saturation on 2 liters.  HEENT:  Eyes unremarkable. Pupils are equal, round, and reactive to light.  Fundi not visualized. Oral mucosa unremarkable.  NECK:  No jugular venous distention, __________ . Carotid upstroke brisk and  symmetric. No bruits. No thyromegaly.  LYMPH:  No adenopathy.  LUNGS:  Clear to auscultation bilaterally.  BACK:  No costovertebral angle tenderness.  CHEST:  Unremarkable.  HEART:  PMI non-displaced or sustained. S1 and S2 within normal limits. No  S3, no murmurs.  ABDOMEN:  Obese, positive bowel sounds. Normal in frequency of pitch. No  bruits, no rebound, no guarding, no midline __________ mass, and no  organomegaly.  SKIN:  No rashes or nodules.  EXTREMITIES:  2+ pulses throughout. The left knee is bandaged. Diffuse non-  pitting bilateral lower extremity edema. No cyanosis or clubbing.  NEUROLOGIC:  Oriented to person, place, and time. Cranial nerves 2-12 are  grossly intact. Motor grossly intact.   LABORATORY DATA:  EKG:  Atrial fibrillation. Rate 60's. Axis within normal  limits. Intervals within normal limits. Diffuse non-specific T-wave  flattening. Borderline low voltage in limb leads.   ASSESSMENT/PLAN:  Atrial fibrillation. The patient is having symptomatic   atrial fibrillation with a controlled rate. This is postoperatively. At this  point, I will not start heparin because she is postoperative. Will observe  her on telemetry overnight. I suspect that she will probably convert back to  sinus rhythm. She will need a TSH.   FOLLOW UP:  Will see her again in the a.m. to reassess her rhythm.           ______________________________  Minus Breeding, M.D.     JH/MEDQ  D:  08/22/2005  T:  08/24/2005  Job:  [id number]   cc:   Genevie Ann, M.D.  [facility]   Kirk Ruths, M.D. [facility]  [address] [place]  [place] [address]

## 2011-04-18 NOTE — Op Note (Signed)
[place]  Patient:    [name], [name] Visit Number: [id number] MRN: [id number]          Service Type: SUR Location: [id number] Attending Physician:  Donia Guiles Dictated by:   Hosie Spangle, M.D. Proc. Date: 10/07/01 Admit Date:  10/07/2001 Discharge Date: 10/08/2001                             Operative Report  PREOPERATIVE DIAGNOSIS:  C4-5, C5-6, and C6-7 cervical degenerative disc disease, spondylosis, and disk herniation.  POSTOPERATIVE DIAGNOSIS:  C4-5, C5-6, and C6-7 cervical degenerative disc disease, spondylosis, and disk herniation.  PROCEDURE:  C4-5, C5-6, and C6-7 anterior cervical diskectomy and arthrodesis with iliac crest allograft and tethered cervical plating.  SURGEON:  Hosie Spangle, M.D.  ASSISTANT:  Zigmund Daniel. Joya Salm, M.D.  ANESTHESIA:  General endotracheal.  INDICATIONS FOR PROCEDURE:  The patient is a 65 year old woman who presented with right cervical radiculopathy.  Was found to have advanced degenerative disc disease, spondylosis, and superimposed disk herniations at three levels with the decision made to proceed with anterior cervical diskectomy and arthrodesis.  DESCRIPTION OF PROCEDURE:  The patient was brought to the operating room, and placed under general endotracheal anesthesia.  The patient was placed in 10 pounds of Holter traction, and the neck was prepped with Betadine soak solution, and draped in a sterile fashion.  An oblique incision was made on the left side of the neck.  The line of the incision was infiltrated with local anesthetic with epinephrine, the incision itself parallel to the anterior border of the sternocleidomastoid.  Dissection was carried down through the subcutaneous tissues through the platysma, and then dissection was carried out through an avascular plane, through the sternocleidomastoid, carotid artery, and jugular vein laterally, and the trachea  and esophagus medially.  The ventral aspect of the vertebral collum was identified, and a localizing x-ray was taken.  The C4-5, C5-6, and C6-7 intervertebral disk spaces were identified.  Diskectomy was begun with incision of each of the ______ and continued with microcurettes and pituitary rongeurs.  The cauterized end plates of the corresponding vertebrae were removed using microcurettes and the micromax drill.  Then the microscope was draped and brought into the field to provide additional magnification illumination and visualization, and then the remainder of the procedure was performed using microdissection and microsurgical technique.  There was significant spondylitic overgrowth, the disk was markedly degenerated, there was posterior spondylitic spurring, this was removed using the micromax drill, and a 2 mm Kerrison punch with a thin foot plate.  The posterior longitudinal ligament was spondylitic and was carefully removed, and foraminotomies were performed bilaterally at each level.  In the end, good decompression of the spinal canal and thecal sac, as well as the foramina and nerve roots was achieved bilaterally at each level.  The wound was irrigated with bacitracin solution, checked for hemostasis, which was established with the use of Gelfoam soaked in thrombin, and then we proceeded with arthrodesis.  We selected wedging of iliac crest allograft, each was cut and shaped to size and positioned in the intervertebral disk space and counter sunk.  We then selected a three level tethered cervical plate and positioned it over the fusion construct, and secured it to the C4 and C7 vertebrae with a pair of 4.0 x 14 mm screws, and to the C5 and C6 vertebrae with a single 4.0 x 14  mm screw.  Each of the screw holes was started with an awe, and then the screw placed.  Final tightening of all the screws was done, and then the wound was irrigated once again with bacitracin solution,  checked for hemostasis, was established, confirmed, and then we proceeded with closure of the platysma.  It was closed with interrupted inverted 2-0 Vicryl sutures.  The subcutaneous and subcuticular were closed with interrupted inverted 3-0 Vicryl sutures.  The skin was reapproximated with Dermabond.  The patient tolerated the procedure well. Estimated blood loss was 200 cc.  Sponge and needle count were correct. Following surgery, the patient was reversed from anesthetic.  She had been taken out of traction once the bone grafts had been placed, and tried the plating.  The patient is to be subsequently extubated and transferred to the recovery room for further care. Dictated by:   Hosie Spangle, M.D. Attending Physician:  Donia Guiles DD:  10/07/01 TD:  10/09/01 Job: [id number]

## 2011-04-18 NOTE — Discharge Summary (Signed)
NAMEMarland Kitchen  Wendy, [name]             ACCOUNT NO.:  [id number]   MEDICAL RECORD NO.:  [id number]          PATIENT TYPE:  INP   LOCATION:  2002                         FACILITY:  [facility]   PHYSICIAN:  Kirk Ruths, M.D. LHCDATE OF BIRTH:  [birth year]   DATE OF ADMISSION:  11/05/2004  DATE OF DISCHARGE:  11/06/2004                                 DISCHARGE SUMMARY   PROCEDURES:  1.  Cardiac catheterization.  2.  Coronary arteriogram.  3.  Left ventriculogram.   HOSPITAL COURSE:  [name] is a 65 year old female with no known history  of coronary artery disease.  Her cardiac risk factors are diabetes,  hypertension, hyperlipidemia, obesity.  She had chest pain that was  described as a tightness.  It occurred with exertion and was relieved by  rest.  She had an episode on the day of admission that was associated with  shortness of breath, nausea and diaphoresis.  She was admitted for further  evaluation and treatment.   Her enzymes were negative for MI, but it was felt that her catheterization  was indicated to further define her anatomy.  The catheterization was  performed in November 06, 2004.  It showed a 30-40% stenosis and a diagonal,  but no other disease, and en EF of 55% with no MR.  Dr. Dannielle Burn reviewed the  films and felt that medical therapy was the best option.   [name] was anemic upon arrival with a hemoglobin of 10.3 and  hematocrit of 30.9.  Her MCV was 84.1.  Hemoglobin and hematocrit in June  2005 were 12.3/36.3, respectively.  Stool guaiacs were ordered but none have  been performed.  TSH was within normal limits at 4.470.  Renal function was  also within normal limits with BUN 27 and creatinine of 0.7.  Dr. Stanford Breed  felt that she could be evaluated as an outpatient.  He ordered an iron  profile, and this was performed, but the results were pending at the time of  dictation.  [name] __________ is pending completion, but if her groin  is stable with  ambulation, she is tentatively considered stable for  discharge on November 06, 2004, with outpatient follow up arranged.   DISCHARGE DIAGNOSES:  1.  Chest pain, no critical coronary artery disease by catheterization this      admission with preserved left ventricular function.  2.  Diabetes.  3.  Hypertension.  4.  Hyperlipidemia.  5.  History of gastroesophageal reflux disease symptoms.  6.  Anemia.  7.  Allergy or intolerance to codeine.  8.  Status post cataract surgery, neck surgery, cholecystectomy, carpal      tunnel, appendectomy, hernia repair, C-section and knee surgery.  9.  History of asthma.  10. History of joint pain.   DISCHARGE INSTRUCTIONS:  1.  Activity:  No strenuous activity for two days.  2.  Diet:  Low-fat, diabetic diet.  3.  She is to call the office for problems with the cath site.  She is to      call Dr. Herbie Baltimore Day for an appointment and follow up with Dr. Stanford Breed  on December 19 at 4:15.   DISCHARGE MEDICATIONS:  1.  Glucophage is on hold until December 9, and needs to be restarted then.  2.  Combivent b.i.d.  3.  Zoloft 50 mg daily.  4.  Aspirin 81 mg daily.  5.  __________ daily.  6.  Vytorin 10/40 daily.  7.  Advair 500/50 b.i.d.  8.  HCTZ 25 mg daily.  9.  Benicar 20 mg daily.  10. Amaryl 4 mg daily.  11. Avandia one half tablet daily.  12. Piroxicam 20 mg q.h.s.  13. Darvocet p.r.n.       RB/MEDQ  D:  11/06/2004  T:  11/07/2004  Job:  [id number]   cc:   Genevie Ann, M.D.

## 2011-05-05 ENCOUNTER — Ambulatory Visit (INDEPENDENT_AMBULATORY_CARE_PROVIDER_SITE_OTHER): Payer: Medicare Other | Admitting: Internal Medicine

## 2011-05-05 ENCOUNTER — Encounter: Payer: Self-pay | Admitting: Internal Medicine

## 2011-05-05 VITALS — BP 138/70 | HR 68 | Ht 66.0 in | Wt 343.2 lb

## 2011-05-05 DIAGNOSIS — Z1211 Encounter for screening for malignant neoplasm of colon: Secondary | ICD-10-CM | POA: Insufficient documentation

## 2011-05-05 DIAGNOSIS — D649 Anemia, unspecified: Secondary | ICD-10-CM

## 2011-05-05 DIAGNOSIS — I270 Primary pulmonary hypertension: Secondary | ICD-10-CM

## 2011-05-05 DIAGNOSIS — I509 Heart failure, unspecified: Secondary | ICD-10-CM

## 2011-05-05 NOTE — Assessment & Plan Note (Signed)
Records review shows her hemoglobin was 9 in 2009 and more recently in March 2012 she had a hemoglobin of 10.2. She has chronic renal insufficiency she has numerous other comorbidities that could lead to a chronic disease anemia. She does not report bleeding though she is at increased risk of occult bleeding on warfarin. The RDW is 18% which raises questions about possible problems. If she has not had an anemia panel anytime recently she should have one. I can factor the results of that and as to whether or not she well an endoscopic evaluation that she would be at high risk for problems. She's had some TIAs in the past though I don't know if they were symptomatic early picked up on imaging, holding the warfarin could be problematic in this lady as well.

## 2011-05-05 NOTE — Patient Instructions (Signed)
We will obtain your records from [facility]. We will obtain your records from [facility].  We will contact you with records review and further instructions.

## 2011-05-05 NOTE — Assessment & Plan Note (Signed)
Stops every 2 steps 3 pillow orthopnea Home O2

## 2011-05-05 NOTE — Assessment & Plan Note (Addendum)
I don't think she is appropriate for routine colorectal cancer screening due to her comorbidities and level of symptoms. Though we don't know for sure it seems unlikely she will live another 10 years with these problems. I explained this to her today she was excepting. She had a normal colonoscopy in 2003 so the next time she should have one would not be earlier than 2013 with respect to routine preventive care. She also had a negative flexible sigmoidoscopy BE and 2006. That was really an incomplete colonoscopy.  Mother had colon cancer so I think that's why she was on an every five-year plan as recommended by previous gastroenterologist, I did not ask her how old her mother was when she was diagnosed at this point. Even if she was below 73 or 65, given the comorbidities this lady has routine preventive colonoscopy is not worth the risk benefit ratio in my estimation.  She does have anemia and could need diagnostic evaluation of that. Her situation is complicated in that she takes warfarin and has numerous comorbidities as per the problem list and history.

## 2011-05-05 NOTE — Assessment & Plan Note (Signed)
Home O2, dyspnea, edema

## 2011-05-05 NOTE — Progress Notes (Signed)
Subjective:    Patient ID: [name], female    DOB: 11/26/1946, 65 y.o.   MRN: [id number]  HPI 65 year old white woman here with family members to review and discuss a possible colonoscopy. She know she's had them in the past and while she was here and afterwards I was able to find out she had a colonoscopy in 2003 that was unremarkable, and then an incomplete colonoscopy with barium enema in 2006 that showed some diverticulosis. She really has no major GI symptoms at this time. Some occasional bloating and gas and nausea. She has not reported bleeding. She does have an anemia, I don't have a true baseline on that. Hemoglobin 10.2 with an MCV 85 in March of this year. Records review shows a hemoglobin of 9 with similar MCV the ferritin 71 2009.  She has significant dyspnea on exertion able to go 2 steps before she has to stop, she has 3 pillow orthopnea. She is on home oxygen. She has severe chronic lower extremity edema.  Past Medical History  Diagnosis Date  . Allergy   . Anemia   . Arthritis   . Asthma   . Cataract     had surgery  . Clotting disorder   . CHF (congestive heart failure)   . Diabetes mellitus   . Heart murmur   . Hyperlipidemia   . Hypertension   . Myocardial infarction     2010  . Stroke     2 mini  . Oxygen dependent     2 liter per nasal cannula  . Gait instability     uses cane  . GERD (gastroesophageal reflux disease)   . Obesity   . Sleep apnea, obstructive 2008   Past Surgical History  Procedure Date  . Cataract extraction w/ intraocular lens  implant, bilateral   . Appendectomy   . Cholecystectomy   . Carpal tunnel release     right hand  . Cervical spine surgery     fusion  . Cesarean section   . Umbilical hernia repair   . Knee arthroscopy     twice, left  . Colonoscopy 11/22/2002    normal - Dr.Rrourk  . Upper gastrointestinal endoscopy 11/22/2002    Normal - Dr. Gala Romney  . Colonoscopy 11/26/2005    incomplete with negative BE  (Dr. Rowe Pavy, [place])    reports that she has never smoked. She has never used smokeless tobacco. She reports that she does not drink alcohol or use illicit drugs. family history includes Alzheimer's disease in her mother; Breast cancer in her sister; Colon cancer in her mother; Diabetes in her mother and unspecified family member; Heart attack in her mother and unspecified family member; and Heart disease in her mother. Allergies  Allergen Reactions  . Codeine       Current outpatient prescriptions:alendronate (FOSAMAX) 70 MG tablet, Take 1 tablet by mouth every 7 (seven) days. , Disp: , Rfl: ;  CRESTOR 20 MG tablet, Take 1 tablet by mouth Daily., Disp: , Rfl: ;  diltiazem (DILACOR XR) 180 MG 24 hr capsule, Take 1 tablet by mouth Daily., Disp: , Rfl: ;  fish oil-omega-3 fatty acids 1000 MG capsule, Take 2 g by mouth daily.  , Disp: , Rfl:  furosemide (LASIX) 20 MG tablet, Take 2 tablets by mouth Daily., Disp: , Rfl: ;  LEVEMIR FLEXPEN 100 UNIT/ML injection, Inject 43 Units into the skin Twice daily. After breadfast and at 9pm, Disp: , Rfl: ;  lisinopril (PRINIVIL,ZESTRIL) 10  MG tablet, Take 1 tablet by mouth Daily., Disp: , Rfl: ;  metoprolol tartrate (LOPRESSOR) 25 MG tablet, Take 1 tablet by mouth Twice daily., Disp: , Rfl:  potassium chloride SA (KLOR-CON M20) 20 MEQ tablet, Take 1 tablet (20 mEq total) by mouth daily., Disp: 30 tablet, Rfl: 6;  PROAIR HFA 108 (90 BASE) MCG/ACT inhaler, Take 2 puffs by mouth as needed. For shortness of breath, Disp: , Rfl: ;  SINGULAIR 10 MG tablet, Take 1 tablet by mouth Daily., Disp: , Rfl: ;  SPIRIVA HANDIHALER 18 MCG inhalation capsule, Take 1 capsule by mouth Daily., Disp: , Rfl:  warfarin (COUMADIN) 5 MG tablet, Take 5 mg by mouth Daily., Disp: , Rfl: ;  DISCONTD: iron polysaccharides (NU-IRON) 150 MG capsule, Take 150 mg by mouth daily.  , Disp: , Rfl:    Review of Systems As per history of present illness. Also complains of allergies, back pain, insomnia,  excessive thirst, night sweats and palpitations. All other systems are negative.    Objective:   Physical Exam  Constitutional:       Obese, chronically ill in wheelchair [place] O2  HENT:  Mouth/Throat: No oropharyngeal exudate.  Eyes: No scleral icterus.  Cardiovascular:       Distant s1 and s 2 no gallop or murmur  Pulmonary/Chest: Breath sounds normal. She has no rales.  Abdominal:       obese  Musculoskeletal:       4+ edema in legs  Psychiatric: She has a normal mood and affect.          Assessment & Plan:

## 2011-05-06 ENCOUNTER — Telehealth: Payer: Self-pay

## 2011-05-06 DIAGNOSIS — D500 Iron deficiency anemia secondary to blood loss (chronic): Secondary | ICD-10-CM

## 2011-05-06 NOTE — Telephone Encounter (Signed)
Patient advised she needs additional lab work she will come by and have drawn this or next week

## 2011-05-06 NOTE — Telephone Encounter (Signed)
Message copied by Marlon Pel on Tue May 06, 2011  3:25 PM ------      Message from: Silvano Rusk E      Created: Mon May 05, 2011  2:02 PM      Regarding: needs anemia panel       Please contact [facility] about her      Have they checked ferritin or B12 in past 6-12 months?            If so I need to see those      If not she needs an anemia panel re: anemia 285.9      Could do there and fax here if desired

## 2011-05-06 NOTE — Telephone Encounter (Signed)
No answer and no machine I will continue to try and reach the patient.

## 2011-05-14 ENCOUNTER — Other Ambulatory Visit (INDEPENDENT_AMBULATORY_CARE_PROVIDER_SITE_OTHER): Payer: Medicare Other

## 2011-05-14 DIAGNOSIS — D500 Iron deficiency anemia secondary to blood loss (chronic): Secondary | ICD-10-CM

## 2011-05-14 DIAGNOSIS — Z79899 Other long term (current) drug therapy: Secondary | ICD-10-CM

## 2011-05-14 LAB — FERRITIN: Ferritin: 37.6 ng/mL (ref 10.0–291.0)

## 2011-05-14 LAB — IBC PANEL
Iron: 39 ug/dL — ABNORMAL LOW (ref 42–145)
Saturation Ratios: 8.5 % — ABNORMAL LOW (ref 20.0–50.0)

## 2011-05-14 LAB — VITAMIN B12: Vitamin B-12: 531 pg/mL (ref 211–911)

## 2011-05-16 ENCOUNTER — Telehealth: Payer: Self-pay

## 2011-05-16 DIAGNOSIS — E611 Iron deficiency: Secondary | ICD-10-CM

## 2011-05-16 MED ORDER — FERROUS SULFATE 325 (65 FE) MG PO TABS: 325.0000 mg | ORAL_TABLET | Freq: Two times a day (BID) | ORAL | Status: DC

## 2011-05-16 NOTE — Telephone Encounter (Signed)
Message copied by Marlon Pel on Fri May 16, 2011  1:51 PM ------      Message from: Gatha Mayer      Created: Fri May 16, 2011 12:59 PM       Iron is low show should go on ferrous sulfate 325 mg bid      I am not convinced risk of colonoscopy worth possible benefit but maybe      Have her see me in 2 months with cbc and ferritin before the visit so I have when I see her            copy PCP

## 2011-05-16 NOTE — Progress Notes (Signed)
Quick Note:  Iron is low show should go on ferrous sulfate 325 mg bid I am not convinced risk of colonoscopy worth possible benefit but maybe Have her see me in 2 months with cbc and ferritin before the visit so I have when I see her  copy PCP ______

## 2011-05-16 NOTE — Telephone Encounter (Signed)
Patient aware.  She will call back to schedule an appt for mid August.

## 2011-08-13 DIAGNOSIS — I5033 Acute on chronic diastolic (congestive) heart failure: Secondary | ICD-10-CM

## 2011-08-13 DIAGNOSIS — I517 Cardiomegaly: Secondary | ICD-10-CM

## 2011-08-22 LAB — POCT I-STAT 3, ART BLOOD GAS (G3+)
Bicarbonate: 24.7 — ABNORMAL HIGH
Operator id: 118461
TCO2: 26
pH, Arterial: 7.428 — ABNORMAL HIGH

## 2011-08-22 LAB — POCT I-STAT 3, VENOUS BLOOD GAS (G3P V)
Bicarbonate: 21.4
O2 Saturation: 57
TCO2: 23
pCO2, Ven: 41.5 — ABNORMAL LOW

## 2011-08-22 LAB — PROTIME-INR
INR: 1.3
Prothrombin Time: 16 — ABNORMAL HIGH

## 2011-08-26 LAB — BLOOD GAS, ARTERIAL
Bicarbonate: 24.9 — ABNORMAL HIGH
Drawn by: 101791
FIO2: 0.21
O2 Saturation: 96.2
Patient temperature: 98.6
pH, Arterial: 7.413 — ABNORMAL HIGH
pO2, Arterial: 82.9

## 2011-09-01 ENCOUNTER — Telehealth: Payer: Self-pay | Admitting: Cardiology

## 2011-09-01 MED ORDER — DILTIAZEM HCL ER 180 MG PO CP24: 180.0000 mg | ORAL_CAPSULE | Freq: Every day | ORAL | Status: DC

## 2011-09-01 NOTE — Telephone Encounter (Signed)
Diltiazem 180mg  refill needed

## 2011-09-03 LAB — BASIC METABOLIC PANEL
BUN: 14
BUN: 16
CO2: 24
CO2: 29
Calcium: 8.7
Calcium: 8.8
Chloride: 103
Chloride: 103
Creatinine, Ser: 0.62
Creatinine, Ser: 0.79
Creatinine, Ser: 0.84
GFR calc Af Amer: >60
Sodium: 136

## 2011-09-03 LAB — CBC
Hemoglobin: 9.2 — ABNORMAL LOW
MCHC: 32.5
MCV: 83.9
Platelets: 169
RBC: 3.37 — ABNORMAL LOW
RBC: 3.66 — ABNORMAL LOW
RDW: 19.6 — ABNORMAL HIGH
WBC: 7.6

## 2011-09-03 LAB — PROTIME-INR
INR: 2 — ABNORMAL HIGH
INR: 2.3 — ABNORMAL HIGH
Prothrombin Time: 19.3 — ABNORMAL HIGH
Prothrombin Time: 23.4 — ABNORMAL HIGH
Prothrombin Time: 24.2 — ABNORMAL HIGH

## 2011-09-03 LAB — COMPREHENSIVE METABOLIC PANEL
AST: 28
Albumin: 3.2 — ABNORMAL LOW
Alkaline Phosphatase: 173 — ABNORMAL HIGH
BUN: 15
CO2: 25
Chloride: 108
GFR calc non Af Amer: >60
Potassium: 3.9
Total Bilirubin: 0.9

## 2011-09-03 LAB — LIPID PANEL
Cholesterol: 155
HDL: 39 — ABNORMAL LOW
Triglycerides: 96

## 2011-09-03 LAB — GLUCOSE, CAPILLARY
Glucose-Capillary: 102 — ABNORMAL HIGH
Glucose-Capillary: 116 — ABNORMAL HIGH
Glucose-Capillary: 120 — ABNORMAL HIGH
Glucose-Capillary: 149 — ABNORMAL HIGH

## 2011-09-03 LAB — CK TOTAL AND CKMB (NOT AT ARMC): Relative Index: INVALID

## 2011-09-03 LAB — HEPATIC FUNCTION PANEL
AST: 26
Albumin: 3.2 — ABNORMAL LOW
Total Bilirubin: 0.8
Total Protein: 6.9

## 2011-09-03 LAB — T4: T4, Total: 7.8

## 2011-09-03 LAB — DIFFERENTIAL
Basophils Absolute: 0
Basophils Relative: 1
Eosinophils Absolute: 0.1
Neutro Abs: 5.1
Neutrophils Relative %: 76

## 2011-09-03 LAB — POCT I-STAT, CHEM 8
BUN: 18
Calcium, Ion: 1.07 — ABNORMAL LOW
Chloride: 109
HCT: 31 — ABNORMAL LOW
Potassium: 4
Sodium: 141

## 2011-09-03 LAB — CARDIAC PANEL(CRET KIN+CKTOT+MB+TROPI)
Relative Index: INVALID
Total CK: 54

## 2011-09-03 LAB — TSH: TSH: 4.041

## 2011-09-03 LAB — FOLATE: Folate: 15.9

## 2011-09-03 LAB — MAGNESIUM: Magnesium: 1.9

## 2011-09-03 LAB — IRON AND TIBC: TIBC: 378

## 2011-09-03 LAB — VITAMIN B12: Vitamin B-12: 731 (ref 211–911)

## 2011-09-03 LAB — POCT CARDIAC MARKERS
CKMB, poc: 1
Myoglobin, poc: 56

## 2011-09-10 ENCOUNTER — Encounter: Payer: Self-pay | Admitting: Cardiology

## 2011-09-10 ENCOUNTER — Ambulatory Visit (INDEPENDENT_AMBULATORY_CARE_PROVIDER_SITE_OTHER): Payer: Medicare Other | Admitting: Cardiology

## 2011-09-10 VITALS — BP 118/60 | HR 50 | Resp 18 | Ht 66.0 in | Wt 332.0 lb

## 2011-09-10 DIAGNOSIS — I509 Heart failure, unspecified: Secondary | ICD-10-CM

## 2011-09-10 DIAGNOSIS — E669 Obesity, unspecified: Secondary | ICD-10-CM

## 2011-09-10 DIAGNOSIS — I4891 Unspecified atrial fibrillation: Secondary | ICD-10-CM

## 2011-09-10 DIAGNOSIS — I5022 Chronic systolic (congestive) heart failure: Secondary | ICD-10-CM

## 2011-09-10 MED ORDER — LISINOPRIL 10 MG PO TABS: 10.0000 mg | ORAL_TABLET | Freq: Every day | ORAL | Status: DC

## 2011-09-10 NOTE — Assessment & Plan Note (Signed)
She is SOB but she does not want to be hospitalized.  She is to restrict salt.  I will have her take 40 mg of Lasix in the AM and 20 mg in the PM for the next three days.  I recognize that she has an increased creat and some orthostatic symptoms.  I will be reducing her ACE inhibitor.  I am not sure why she is not taking the doses that were listed in the discharge note.  I have reviewed the office records as well.  She will come back early next week for a BMET.

## 2011-09-10 NOTE — Assessment & Plan Note (Signed)
She tolerates this rhythm and anticoagulation.  She will continue the medications as listed.

## 2011-09-10 NOTE — Patient Instructions (Signed)
Please decrease Lisinopril to 10 mg a day  Take Lasix 40 mg in the am and 20 mg in the pm for three days.  Continue other medications as listed.  Please have lab work early next week.  Follow up in 1 year with Dr Percival Spanish.  You will receive a letter in the mail 2 months before you are due.  Please call us when you receive this letter to schedule your follow up appointment.

## 2011-09-10 NOTE — Assessment & Plan Note (Signed)
This is a severe problem and she needs calorie restriction.

## 2011-09-10 NOTE — Progress Notes (Signed)
HPI The patient presents for follow up after a recent hospitalization at [facility] for dyspnea.  I reviewed the hospitalized hospital records and I she was hospitalized with diastolic heart failure as well as exacerbation of chronic lung disease.  She did have an echo demonstrating a preserved EF.  She was sent home on a higher dose of diuretic in the lower dose of ACE inhibitor. She  Not weigh herself immediately upon getting home because she didn't have a scale though she has one now. She thinks her weight is up compared to discharge.  She has increasing dyspnea.  She is on O2 continuously. She also reports that she is getting lightheaded with standing though she has had no presyncope. Her edema is unchanged. She denies any chest pressure, neck or arm discomfort. I did discuss with her her diet and I don't think she is limiting her salt or has had a clear understanding of this.  Allergies  Allergen Reactions  . Codeine     Current Outpatient Prescriptions  Medication Sig Dispense Refill  . alendronate (FOSAMAX) 70 MG tablet Take 1 tablet by mouth every 7 (seven) days.       . CRESTOR 20 MG tablet Take 1 tablet by mouth Daily.      Marland Kitchen diltiazem (DILACOR XR) 180 MG 24 hr capsule Take 1 capsule (180 mg total) by mouth daily.  30 capsule  12  . ferrous sulfate 325 (65 FE) MG tablet Take 1 tablet (325 mg total) by mouth 2 (two) times daily.  30 tablet  3  . fish oil-omega-3 fatty acids 1000 MG capsule Take 2 g by mouth daily.        . furosemide (LASIX) 20 MG tablet Take 2 tablets by mouth Daily.      Marland Kitchen LEVEMIR FLEXPEN 100 UNIT/ML injection Inject 43 Units into the skin Twice daily. After breadfast and at 9pm      . lisinopril (PRINIVIL,ZESTRIL) 10 MG tablet Take 20 mg by mouth Daily.       . metFORMIN (GLUCOPHAGE) 500 MG tablet Take 500 mg by mouth 2 (two) times daily with a meal.        . metoprolol tartrate (LOPRESSOR) 25 MG tablet Take 1 tablet by mouth Twice daily.      . potassium chloride SA  (KLOR-CON M20) 20 MEQ tablet Take 1 tablet (20 mEq total) by mouth daily.  30 tablet  6  . PROAIR HFA 108 (90 BASE) MCG/ACT inhaler Take 2 puffs by mouth as needed. For shortness of breath      . SPIRIVA HANDIHALER 18 MCG inhalation capsule Take 1 capsule by mouth Daily.      Marland Kitchen warfarin (COUMADIN) 5 MG tablet Take 5 mg by mouth Daily.        Past Medical History  Diagnosis Date  . Allergy   . Anemia   . Arthritis   . Asthma   . Cataract     had surgery  . Clotting disorder   . CHF (congestive heart failure)   . Diabetes mellitus   . Heart murmur   . Hyperlipidemia   . Hypertension   . Myocardial infarction     2010  . Stroke     2 mini  . Oxygen dependent     2 liter per nasal cannula  . Gait instability     uses cane  . GERD (gastroesophageal reflux disease)   . Obesity   . Sleep apnea, obstructive 2008  Past Surgical History  Procedure Date  . Cataract extraction w/ intraocular lens  implant, bilateral   . Appendectomy   . Cholecystectomy   . Carpal tunnel release     right hand  . Cervical spine surgery     fusion  . Cesarean section   . Umbilical hernia repair   . Knee arthroscopy     twice, left  . Colonoscopy 11/22/2002    normal - Dr.Rrourk  . Upper gastrointestinal endoscopy 11/22/2002    Normal - Dr. Gala Romney  . Colonoscopy 11/26/2005    incomplete with negative BE (Dr. Rowe Pavy, [place])    ROS:   As stated in the HPI and negative for all other systems.  PHYSICAL EXAM BP 118/60  Pulse 50  Resp 18  Ht 5\' 6"  (1.676 m)  Wt 332 lb (150.594 kg)  BMI 53.59 kg/m2  SpO2 92% PHYSICAL EXAM GEN:  Dyspneic  NECK:  Jugular venous distention at 90 degrees, waveform within normal limits, carotid upstroke brisk and symmetric, no bruits, no thyromegaly LYMPHATICS:  No cervical adenopathy LUNGS:  Clear to auscultation bilaterally BACK:  No CVA tenderness CHEST:  Unremarkable HEART:  S1 and S2 within normal limits, no S3, no S4, no clicks, no rubs, no  murmurs ABD:  Positive bowel sounds normal in frequency in pitch, no bruits, no rebound, no guarding, unable to assess midline mass or bruit with the patient seated, morbid obesity EXT:  2 plus pulses throughout, severeedema, no cyanosis no clubbing SKIN:  No rashes no nodules NEURO:  Cranial nerves II through XII grossly intact, motor grossly intact throughout PSYCH:  Cognitively intact, oriented to person place and time  ASSESSMENT AND PLAN

## 2011-09-16 ENCOUNTER — Encounter: Payer: Self-pay | Admitting: Cardiology

## 2011-12-06 ENCOUNTER — Other Ambulatory Visit: Payer: Self-pay | Admitting: Cardiology

## 2011-12-10 ENCOUNTER — Encounter: Payer: Self-pay | Admitting: Cardiology

## 2011-12-10 ENCOUNTER — Ambulatory Visit (INDEPENDENT_AMBULATORY_CARE_PROVIDER_SITE_OTHER): Payer: Medicare Other | Admitting: Cardiology

## 2011-12-10 DIAGNOSIS — I270 Primary pulmonary hypertension: Secondary | ICD-10-CM

## 2011-12-10 DIAGNOSIS — I4891 Unspecified atrial fibrillation: Secondary | ICD-10-CM

## 2011-12-10 DIAGNOSIS — I509 Heart failure, unspecified: Secondary | ICD-10-CM

## 2011-12-10 NOTE — Progress Notes (Signed)
HPI The patient presents for follow up after hospitalization at [facility] for dyspnea earlier this year. She did have an echo demonstrating a preserved EF. She is on O2 continuously. Her edema is unchanged. She denies any chest pressure, neck or arm discomfort.  Since I last saw her she was in the hospital ER with bradycardia and was take off of the metoprolol.  She has lost about 16 pounds since I saw her last. She feels better. Her breathing is at baseline or slightly better. She's not having any palpitations, presyncope or syncope. She denies any chest pain appear  Allergies  Allergen Reactions  . Codeine     Current Outpatient Prescriptions  Medication Sig Dispense Refill  . alendronate (FOSAMAX) 70 MG tablet Take 1 tablet by mouth every 7 (seven) days.       . CRESTOR 20 MG tablet Take 1 tablet by mouth Daily.      Marland Kitchen diltiazem (DILACOR XR) 180 MG 24 hr capsule Take 1 capsule (180 mg total) by mouth daily.  30 capsule  12  . ferrous sulfate 325 (65 FE) MG tablet Take 1 tablet (325 mg total) by mouth 2 (two) times daily.  30 tablet  3  . fish oil-omega-3 fatty acids 1000 MG capsule Take 2 g by mouth daily.        . furosemide (LASIX) 20 MG tablet Take 2 tablets by mouth Daily.      Marland Kitchen KLOR-CON M20 20 MEQ tablet TAKE ONE TABLET BY MOUTH EVERY DAY  30 tablet  6  . LEVEMIR FLEXPEN 100 UNIT/ML injection Inject 43 Units into the skin Twice daily. After breadfast and at 9pm      . lisinopril (PRINIVIL,ZESTRIL) 10 MG tablet Take 1 tablet (10 mg total) by mouth daily.      . metFORMIN (GLUCOPHAGE) 500 MG tablet Take 500 mg by mouth 2 (two) times daily with a meal.        . PROAIR HFA 108 (90 BASE) MCG/ACT inhaler Take 2 puffs by mouth as needed. For shortness of breath      . SPIRIVA HANDIHALER 18 MCG inhalation capsule Take 1 capsule by mouth Daily.      Marland Kitchen warfarin (COUMADIN) 5 MG tablet Take 5 mg by mouth Daily.        Past Medical History  Diagnosis Date  . Allergy   . Anemia   .  Arthritis   . Asthma   . Cataract     had surgery  . Clotting disorder   . CHF (congestive heart failure)   . Diabetes mellitus   . Heart murmur   . Hyperlipidemia   . Hypertension   . Myocardial infarction     2010  . Stroke     2 mini  . Oxygen dependent     2 liter per nasal cannula  . Gait instability     uses cane  . GERD (gastroesophageal reflux disease)   . Obesity   . Sleep apnea, obstructive 2008    Past Surgical History  Procedure Date  . Cataract extraction w/ intraocular lens  implant, bilateral   . Appendectomy   . Cholecystectomy   . Carpal tunnel release     right hand  . Cervical spine surgery     fusion  . Cesarean section   . Umbilical hernia repair   . Knee arthroscopy     twice, left  . Colonoscopy 11/22/2002    normal - Dr.Rrourk  . Upper  gastrointestinal endoscopy 11/22/2002    Normal - Dr. Gala Romney  . Colonoscopy 11/26/2005    incomplete with negative BE (Dr. Rowe Pavy, [place])    ROS:   As stated in the HPI and negative for all other systems.  PHYSICAL EXAM BP 129/69  Pulse 62  Ht 5\' 6"  (1.676 m)  Wt 315 lb (142.883 kg)  BMI 50.84 kg/m2  SpO2 99% PHYSICAL EXAM GEN:  No apparent distress NECK:  Jugular venous distention at 90 degrees, waveform within normal limits, carotid upstroke brisk and symmetric, no bruits, no thyromegaly LYMPHATICS:  No cervical adenopathy LUNGS:  Clear to auscultation bilaterally BACK:  No CVA tenderness CHEST:  Unremarkable HEART:  S1 and S2 within normal limits, no S3, no S4, no clicks, no rubs, no murmurs ABD:  Positive bowel sounds normal in frequency in pitch, no bruits, no rebound, no guarding, unable to assess midline mass or bruit with the patient seated, morbid obesity EXT:  2 plus pulses throughout, severe edema, no cyanosis no clubbing SKIN:  No rashes no nodules NEURO:  Cranial nerves II through XII grossly intact, motor grossly intact throughout PSYCH:  Cognitively intact, oriented to person place and  time  EKG:  Atrial fibrillation.  Rate 62.  No change from previous.  ASSESSMENT AND PLAN

## 2011-12-10 NOTE — Assessment & Plan Note (Signed)
I congratulated her on her weight loss and I encouraged more of the same.

## 2011-12-10 NOTE — Assessment & Plan Note (Signed)
She tolerates anticoagulation. She will remain off of beta blocker. No other change in therapy is indicated.

## 2011-12-10 NOTE — Assessment & Plan Note (Signed)
She seems to be  euvolemic. She will continue the medicines as listed.

## 2012-03-16 ENCOUNTER — Telehealth: Payer: Self-pay | Admitting: Cardiology

## 2012-03-16 NOTE — Telephone Encounter (Signed)
LMTCB--nt

## 2012-03-16 NOTE — Telephone Encounter (Signed)
Pt only wanted app and that was made prior, no additional needs.

## 2012-03-16 NOTE — Telephone Encounter (Signed)
Please return call to patient at [phone]   Patient as gain 5 pounds over the last week since lasix Med change.  Patient c/o SOB and swelling.  Please return call to patient at hm#, [facility] RN will also be sending over fax with additional info.

## 2012-03-16 NOTE — Telephone Encounter (Signed)
Fu call °Patient returning your call °

## 2012-04-07 ENCOUNTER — Ambulatory Visit (INDEPENDENT_AMBULATORY_CARE_PROVIDER_SITE_OTHER): Payer: Medicare Other | Admitting: Cardiology

## 2012-04-07 ENCOUNTER — Encounter: Payer: Self-pay | Admitting: Cardiology

## 2012-04-07 VITALS — BP 145/60 | HR 60 | Ht 66.0 in | Wt 315.0 lb

## 2012-04-07 DIAGNOSIS — E669 Obesity, unspecified: Secondary | ICD-10-CM

## 2012-04-07 DIAGNOSIS — I509 Heart failure, unspecified: Secondary | ICD-10-CM

## 2012-04-07 DIAGNOSIS — R072 Precordial pain: Secondary | ICD-10-CM | POA: Insufficient documentation

## 2012-04-07 DIAGNOSIS — I4891 Unspecified atrial fibrillation: Secondary | ICD-10-CM

## 2012-04-07 NOTE — Patient Instructions (Signed)
The current medical regimen is effective;  continue present plan and medications.  Your physician has requested that you have a lexiscan myoview. For further information please visit [email or web address]. Please follow instruction sheet, as given.

## 2012-04-07 NOTE — Assessment & Plan Note (Signed)
We have talked about weight loss. He apparently lost about 16 pounds but has gained most of it back.. I encourage continued efforts.

## 2012-04-07 NOTE — Assessment & Plan Note (Signed)
Her pain has some typical and some atypical features. She's had nonobstructive disease in the past. She will need a stress test but was not able to walk on the treadmill. Therefore, she had a [employer].

## 2012-04-07 NOTE — Progress Notes (Signed)
HPI The patient presents for evaluation of chest pain. She has had moderate coronary disease on catheterization 2009. A home nurse made her come today to discuss chest discomfort. She had a severe episode while sitting at the table with her sister. She developed pain under her left breast. It is sharp. There was some left arm discomfort. It was about 2 months ago. She has intermittently had some sharp discomfort at rest. It may last a few minutes. It is moderate in intensity. It is sharp. He's had associated sweatiness but no nausea or vomiting. She has chronic dyspnea. She's very limited in her activities be O2 dependent. She doesn't notice that she can bring on these events however. They come and go spontaneously. She's not sure that she's had these before.  Allergies  Allergen Reactions  . Codeine     Current Outpatient Prescriptions  Medication Sig Dispense Refill  . alendronate (FOSAMAX) 70 MG tablet Take 1 tablet by mouth every 7 (seven) days.       . APPLE CIDER VINEGAR PO Take 1,000 mg by mouth daily. 2 tabs      . diltiazem (DILACOR XR) 180 MG 24 hr capsule Take 1 capsule (180 mg total) by mouth daily.  30 capsule  12  . fish oil-omega-3 fatty acids 1000 MG capsule Take 2 g by mouth daily.        . furosemide (LASIX) 20 MG tablet Take 2 tablets by mouth Daily.      Marland Kitchen KLOR-CON M20 20 MEQ tablet TAKE ONE TABLET BY MOUTH EVERY DAY  30 tablet  6  . LEVEMIR FLEXPEN 100 UNIT/ML injection Inject 43 Units into the skin Twice daily. After breadfast and at 9pm      . metFORMIN (GLUCOPHAGE) 500 MG tablet Take 500 mg by mouth 2 (two) times daily with a meal.        . PROAIR HFA 108 (90 BASE) MCG/ACT inhaler Take 2 puffs by mouth as needed. For shortness of breath      . rosuvastatin (CRESTOR) 10 MG tablet Take 10 mg by mouth daily.      Marland Kitchen SPIRIVA HANDIHALER 18 MCG inhalation capsule Take 1 capsule by mouth Daily. As mneeded      . warfarin (COUMADIN) 5 MG tablet Take 5 mg by mouth Daily.         Past Medical History  Diagnosis Date  . Allergy   . Anemia   . Arthritis   . Asthma   . Cataract     had surgery  . Clotting disorder   . CHF (congestive heart failure)   . Diabetes mellitus   . Heart murmur   . Hyperlipidemia   . Hypertension   . CAD (coronary artery disease)     LAD 60% proximal stenosis mid and distal 60% stenosis 2009.  . Stroke     2 mini  . Oxygen dependent     2 liter per nasal cannula  . Gait instability     uses cane  . GERD (gastroesophageal reflux disease)   . Obesity   . Sleep apnea, obstructive 2008    Past Surgical History  Procedure Date  . Cataract extraction w/ intraocular lens  implant, bilateral   . Appendectomy   . Cholecystectomy   . Carpal tunnel release     right hand  . Cervical spine surgery     fusion  . Cesarean section   . Umbilical hernia repair   . Knee arthroscopy  twice, left  . Colonoscopy 11/22/2002    normal - Dr.Rrourk  . Upper gastrointestinal endoscopy 11/22/2002    Normal - Dr. Gala Romney  . Colonoscopy 11/26/2005    incomplete with negative BE (Dr. Rowe Pavy, [place])    ROS:   As stated in the HPI and negative for all other systems.  PHYSICAL EXAM BP 145/60  Pulse 60  Ht 5\' 6"  (1.676 m)  Wt 315 lb (142.883 kg)  BMI 50.84 kg/m2 GENERAL:  Well appearing HEENT:  Pupils equal round and reactive, fundi not visualized, oral mucosa unremarkable NECK:  No jugular venous distention, waveform within normal limits, carotid upstroke brisk and symmetric, no bruits, no thyromegaly LYMPHATICS:  No cervical, inguinal adenopathy LUNGS:  Clear to auscultation bilaterally BACK:  No CVA tenderness CHEST:  Unremarkable HEART:  PMI not displaced or sustained,S1 and S2 within normal limits, no S3, no S4, no clicks, no rubs, no murmurs ABD:  Flat, positive bowel sounds normal in frequency in pitch, no bruits, no rebound, no guarding, no midline pulsatile mass, no hepatomegaly, no splenomegaly EXT:  2 plus pulses  throughout, moderate edema, lymphadema, no cyanosis no clubbing SKIN:  No rashes no nodules NEURO:  Cranial nerves II through XII grossly intact, motor grossly intact throughout PSYCH:  Cognitively intact, oriented to person place and time  EKG:  Atrial fibrillation, rate 74, axis within normal limits, intervals within normal limits, no acute ST-T wave changes.  04/07/2012  ASSESSMENT AND PLAN

## 2012-04-07 NOTE — Assessment & Plan Note (Signed)
She tolerates anticoagulation. She will remain off of beta blocker. No other change in therapy is indicated.

## 2012-04-07 NOTE — Assessment & Plan Note (Signed)
She has had heart failure with preserved ejection fraction. She seems to be euvolemic.  At this point she will continue current therapies.

## 2012-04-14 ENCOUNTER — Ambulatory Visit (HOSPITAL_COMMUNITY): Payer: Medicare Other | Attending: Cardiology | Admitting: Radiology

## 2012-04-14 VITALS — BP 150/40 | Ht 66.0 in | Wt 309.0 lb

## 2012-04-14 DIAGNOSIS — I4891 Unspecified atrial fibrillation: Secondary | ICD-10-CM | POA: Insufficient documentation

## 2012-04-14 DIAGNOSIS — I739 Peripheral vascular disease, unspecified: Secondary | ICD-10-CM | POA: Insufficient documentation

## 2012-04-14 DIAGNOSIS — Z8673 Personal history of transient ischemic attack (TIA), and cerebral infarction without residual deficits: Secondary | ICD-10-CM | POA: Insufficient documentation

## 2012-04-14 DIAGNOSIS — E119 Type 2 diabetes mellitus without complications: Secondary | ICD-10-CM | POA: Insufficient documentation

## 2012-04-14 DIAGNOSIS — I10 Essential (primary) hypertension: Secondary | ICD-10-CM | POA: Insufficient documentation

## 2012-04-14 DIAGNOSIS — I2789 Other specified pulmonary heart diseases: Secondary | ICD-10-CM | POA: Insufficient documentation

## 2012-04-14 DIAGNOSIS — R079 Chest pain, unspecified: Secondary | ICD-10-CM

## 2012-04-14 DIAGNOSIS — R0989 Other specified symptoms and signs involving the circulatory and respiratory systems: Secondary | ICD-10-CM | POA: Insufficient documentation

## 2012-04-14 DIAGNOSIS — R0602 Shortness of breath: Secondary | ICD-10-CM | POA: Insufficient documentation

## 2012-04-14 DIAGNOSIS — I509 Heart failure, unspecified: Secondary | ICD-10-CM | POA: Insufficient documentation

## 2012-04-14 DIAGNOSIS — J45909 Unspecified asthma, uncomplicated: Secondary | ICD-10-CM | POA: Insufficient documentation

## 2012-04-14 DIAGNOSIS — R0609 Other forms of dyspnea: Secondary | ICD-10-CM | POA: Insufficient documentation

## 2012-04-14 DIAGNOSIS — E785 Hyperlipidemia, unspecified: Secondary | ICD-10-CM | POA: Insufficient documentation

## 2012-04-14 DIAGNOSIS — Z794 Long term (current) use of insulin: Secondary | ICD-10-CM | POA: Insufficient documentation

## 2012-04-14 DIAGNOSIS — R5381 Other malaise: Secondary | ICD-10-CM | POA: Insufficient documentation

## 2012-04-14 DIAGNOSIS — R072 Precordial pain: Secondary | ICD-10-CM

## 2012-04-14 DIAGNOSIS — R61 Generalized hyperhidrosis: Secondary | ICD-10-CM | POA: Insufficient documentation

## 2012-04-14 MED ORDER — TECHNETIUM TC 99M TETROFOSMIN IV KIT
33.0000 | PACK | Freq: Once | INTRAVENOUS | Status: AC | PRN
  Administered 2012-04-14: 33 via INTRAVENOUS

## 2012-04-14 MED ORDER — REGADENOSON 0.4 MG/5ML IV SOLN
0.4000 mg | Freq: Once | INTRAVENOUS | Status: AC
  Administered 2012-04-14: 0.4 mg via INTRAVENOUS

## 2012-04-14 NOTE — Progress Notes (Deleted)
[facility] [place] [address] [phone]  Cardiology Nuclear Med Study  [name] is a 66 y.o. female     MRN : [id number]     DOB: Oct 12, 1946  Procedure Date: 04/14/2012  Nuclear Med Background Indication for Stress Test:  Evaluation for Ischemia History:  Asthma and CHF, AFIB, '09 ECHO: EF: 55-60%, -Heart Cath: mod CAD EF: 65%, 08/13/11: ECHO: EF: 60-65%, mod pulmonary HTN Cardiac Risk Factors: Hypertension, IDDM Type 2, Lipids, PVD and TIA  Symptoms:  Chest Pain, Diaphoresis, DOE, Fatigue with Exertion and SOB   Nuclear Pre-Procedure Caffeine/Decaff Intake:  None NPO After: 7:30pm   Lungs:  clear O2 Sat: 94% on 2 liters/min via Patient connected to nasal cannula oxygen. IV 0.9% NS with Angio Cath:  22g  IV Site: R Antecubital  IV Started by:  Perrin Maltese, EMT-P  Chest Size (in):  42 Cup Size: DD  Height: 5\' 6"  (1.676 m)  Weight:  309 lb (140.161 kg)  BMI:  Body mass index is 49.87 kg/(m^2). Tech Comments:  No Rx this am, CBG 115 mg/dl at home this am    Nuclear Med Study 1 or 2 day study: 2 day  Stress Test Type:  Carlton Adam  Reading MD: Dola Argyle, MD  Order Authorizing Provider:  J.Hochrein MD  Resting Radionuclide: Technetium 58m Tetrofosmin  Resting Radionuclide Dose:  mCi   Stress Radionuclide:  Technetium 75m Tetrofosmin  Stress Radionuclide Dose:  mCi           Stress Protocol Rest HR: 64 Stress HR: 80  Rest BP: 150/40 Stress BP: 157/51  Exercise Time (min): n/a METS: n/a   Predicted Max HR: 152 bpm % Max HR: 52.63 bpm Rate Pressure Product: 12560   Dose of Adenosine (mg):  n/a Dose of Lexiscan: 0.4 mg  Dose of Atropine (mg): {NA AND WILDCARD:21589} Dose of Dobutamine: n/a mcg/kg/min (at max HR)  Stress Test Technologist: Perrin Maltese, EMT-P  Nuclear Technologist:  {CHL LB NUCLEAR TECHNOLOGIST:21021025}     Rest Procedure:  Myocardial perfusion imaging was performed at rest 45  minutes following the intravenous administration of Technetium 31m Tetrofosmin. Rest ECG: Atrial Fibrilliation  Stress Procedure:  The patient received IV Lexiscan 0.4 mg over 15-seconds.  Technetium 31m Tetrofosmin injected at 30-seconds.  There were no significant changes, but chest tightness with Lexiscan.  Quantitative spect images were obtained after a 45 minute delay. Stress ECG: No significant change from baseline ECG  QPS Raw Data Images:   Stress Images:   Rest Images:   Subtraction (SDS):   Transient Ischemic Dilatation (Normal <1.22):  *** Lung/Heart Ratio (Normal <0.45):  ***  Quantitative Gated Spect Images QGS EDV:  *** ml QGS ESV:  *** ml  Impression Exercise Capacity:   BP Response:   Clinical Symptoms:   ECG Impression:   Comparison with Prior Nuclear Study:  Overall Impression:    LV Ejection Fraction: {CHL CARD STUDY NOT GATED:21592:o}.  LV Wall Motion:

## 2012-04-14 NOTE — Progress Notes (Deleted)
[facility] [place] [address] [phone]  Cardiology Nuclear Med Study  [name] is a 66 y.o. female     MRN : [id number]     DOB: [birth year]  Procedure Date: 04/14/2012  Nuclear Med Background Indication for Stress Test:  Evaluation for Ischemia History:  Asthma and CHF, AFIB, '09 ECHO: EF: 55-60%, -Heart Cath: mod CAD EF: 65%, 08/13/11: ECHO: EF: 60-65%, mod pulmonary HTN Cardiac Risk Factors: Hypertension, IDDM Type 2, Lipids, PVD and TIA  Symptoms:  Chest Pain, Diaphoresis, DOE, Fatigue with Exertion and SOB   Nuclear Pre-Procedure Caffeine/Decaff Intake:  None NPO After: 7:30pm   Lungs:  clear O2 Sat: 94% on 2 liters/min via Patient connected to nasal cannula oxygen. IV 0.9% NS with Angio Cath:  22g  IV Site: R Antecubital  IV Started by:  Perrin Maltese, EMT-P  Chest Size (in):  42 Cup Size: DD  Height: 5\' 6"  (1.676 m)  Weight:  309 lb (140.161 kg)  BMI:  Body mass index is 49.87 kg/(m^2). Tech Comments:  No Rx this am, CBG 115 mg/dl at home this am    Nuclear Med Study 1 or 2 day study: 2 day  Stress Test Type:  Carlton Adam  Reading MD: Dola Argyle, MD  Order Authorizing Provider:  J.Hochrein MD  Resting Radionuclide: Technetium 34m Tetrofosmin  Resting Radionuclide Dose:  mCi   Stress Radionuclide:  Technetium 69m Tetrofosmin  Stress Radionuclide Dose:  mCi           Stress Protocol Rest HR: 64 Stress HR: 80  Rest BP: 150/40 Stress BP: 157/51  Exercise Time (min): n/a METS: n/a   Predicted Max HR: 152 bpm % Max HR: 52.63 bpm Rate Pressure Product: 12560   Dose of Adenosine (mg):  n/a Dose of Lexiscan: 0.4 mg  Dose of Atropine (mg): {NA AND WILDCARD:21589} Dose of Dobutamine: n/a mcg/kg/min (at max HR)  Stress Test Technologist: Perrin Maltese, EMT-P  Nuclear Technologist:  {CHL LB NUCLEAR TECHNOLOGIST:21021025}     Rest Procedure:  Myocardial perfusion imaging was performed at rest 45  minutes following the intravenous administration of Technetium 34m Tetrofosmin. Rest ECG: Atrial Fibrilliation  Stress Procedure:  The patient received IV Lexiscan 0.4 mg over 15-seconds.  Technetium 34m Tetrofosmin injected at 30-seconds.  There were no significant changes, but chest tightness with Lexiscan.  Quantitative spect images were obtained after a 45 minute delay. Stress ECG: No significant change from baseline ECG  QPS Raw Data Images:   Stress Images:   Rest Images:   Subtraction (SDS):   Transient Ischemic Dilatation (Normal <1.22):  *** Lung/Heart Ratio (Normal <0.45):  ***  Quantitative Gated Spect Images QGS EDV:  *** ml QGS ESV:  *** ml  Impression Exercise Capacity:   BP Response:   Clinical Symptoms:   ECG Impression:   Comparison with Prior Nuclear Study:  Overall Impression:    LV Ejection Fraction: {CHL CARD STUDY NOT GATED:21592:o}.  LV Wall Motion:

## 2012-04-14 NOTE — Progress Notes (Signed)
[place] [address] [phone]  Cardiology Nuclear Med Study  [name] is a 66 y.o. female     MRN : [id number]     DOB: [birth year]  Procedure Date: 04/14/2012  Nuclear Med Background Indication for Stress Test:  Evaluation for Ischemia History:  '09 Echo:EF=55-60%; Cath:Moderate CAD, EF=65%; 9/12 Echo:EF=60-65%; Atrial Fibrillation Cardiac Risk Factors: Hypertension, IDDM Type 2, Lipids, PVD and TIA  Symptoms:  Chest Pain, Diaphoresis, DOE, Fatigue with Exertion and SOB   Nuclear Pre-Procedure Caffeine/Decaff Intake:  None NPO After: 7:30am   Lungs:  Clear.  O2 SAT 94% on RA. IV 0.9% NS with Angio Cath:  22g  IV Site: R Antecubital  IV Started by:  Perrin Maltese, EMT-P  Chest Size (in):  42 Cup Size: DD  Height: 5\' 6"  (1.676 m)  Weight:  309 lb (140.161 kg)  BMI:  Body mass index is 49.87 kg/(m^2). Tech Comments:  No Rx this am, CG=115 mg/dl at home this am    Nuclear Med Study 1 or 2 day study: 2 day  Stress Test Type:  Carlton Adam  Reading MD: Kirk Ruths Jayvan Mcshan,M.D. Order Authorizing Provider:  Minus Breeding, MD  Resting Radionuclide: Technetium 108m Tetrofosmin  Resting Radionuclide Dose: 33.0 mCi  On     04-16-12  Stress Radionuclide:  Technetium 15m Tetrofosmin  Stress Radionuclide Dose: 33.0 mCi    On     04-15-12          Stress Protocol Rest HR: 64 Stress HR: 80  Rest BP: 150/40 Stress BP: 157/51  Exercise Time (min): n/a METS: n/a   Predicted Max HR: 152 bpm % Max HR: 52.63 bpm Rate Pressure Product: 12560   Dose of Adenosine (mg):  n/a Dose of Lexiscan: 0.4 mg  Dose of Atropine (mg): n/a Dose of Dobutamine: n/a mcg/kg/min (at max HR)  Stress Test Technologist: Perrin Maltese, EMT-P  Nuclear Technologist:  Charlton Amor, CNMT     Rest Procedure:  Myocardial perfusion imaging was performed at rest 45 minutes following the intravenous administration of Technetium 67m  Tetrofosmin.  Rest ECG: Atrial Fibrilliation  Stress Procedure:  The patient received IV Lexiscan 0.4 mg over 15-seconds.  Technetium 49m Tetrofosmin injected at 30-seconds.  There were no significant changes with Lexiscan. Patient c/o chest tightness with Lexiscan.  Quantitative spect images were obtained after a 45 minute delay.  Stress ECG: No significant change from baseline ECG  QPS Raw Data Images:  Normal; no motion artifact; normal heart/lung ratio. Stress Images:  Normal homogeneous uptake in all areas of the myocardium. Rest Images:  Normal homogeneous uptake in all areas of the myocardium. Subtraction (SDS):  There is no evidence of scar or ischemia. Transient Ischemic Dilatation (Normal <1.22):  0.85 Lung/Heart Ratio (Normal <0.45):  0.46  Quantitative Gated Spect Images QGS EDV:  83 ml QGS ESV:  20 ml  Impression Exercise Capacity:  Lexiscan with no exercise. BP Response:  Normal blood pressure response. Clinical Symptoms:  Chest tightness.  ECG Impression:  No significant ST segment change suggestive of ischemia. Comparison with Prior Nuclear Study: No images to compare  Overall Impression:  Normal stress nuclear study.  There is some breast attenuation.   LV Ejection Fraction: 77%.  LV Wall Motion:  NL LV Function; NL Wall Motion  [employer]

## 2012-04-15 ENCOUNTER — Ambulatory Visit (HOSPITAL_COMMUNITY): Payer: Medicare Other | Attending: Cardiology

## 2012-04-15 DIAGNOSIS — R0989 Other specified symptoms and signs involving the circulatory and respiratory systems: Secondary | ICD-10-CM

## 2012-04-15 MED ORDER — TECHNETIUM TC 99M TETROFOSMIN IV KIT
30.0000 | PACK | Freq: Once | INTRAVENOUS | Status: AC | PRN
  Administered 2012-04-15: 30 via INTRAVENOUS

## 2012-07-09 ENCOUNTER — Other Ambulatory Visit: Payer: Self-pay | Admitting: Cardiology

## 2012-07-21 ENCOUNTER — Telehealth: Payer: Self-pay | Admitting: Cardiology

## 2012-07-21 NOTE — Telephone Encounter (Signed)
[name] needed to know when the pt had her last echo and what her EF was.  Reviewed information with her.  She had no further questions.

## 2012-07-21 NOTE — Telephone Encounter (Signed)
New msg [facility] to know latest echo results Please call

## 2012-09-17 ENCOUNTER — Other Ambulatory Visit: Payer: Self-pay | Admitting: Cardiology

## 2013-02-06 ENCOUNTER — Other Ambulatory Visit: Payer: Self-pay | Admitting: Cardiology

## 2013-02-22 ENCOUNTER — Ambulatory Visit: Payer: Self-pay | Admitting: Physician Assistant

## 2013-03-02 ENCOUNTER — Ambulatory Visit (INDEPENDENT_AMBULATORY_CARE_PROVIDER_SITE_OTHER): Payer: Medicare PPO | Admitting: Cardiology

## 2013-03-02 ENCOUNTER — Encounter: Payer: Self-pay | Admitting: Cardiology

## 2013-03-02 VITALS — BP 127/58 | HR 62 | Ht 66.0 in | Wt 301.0 lb

## 2013-03-02 DIAGNOSIS — I509 Heart failure, unspecified: Secondary | ICD-10-CM

## 2013-03-02 DIAGNOSIS — R0602 Shortness of breath: Secondary | ICD-10-CM

## 2013-03-02 DIAGNOSIS — I4891 Unspecified atrial fibrillation: Secondary | ICD-10-CM

## 2013-03-02 DIAGNOSIS — I739 Peripheral vascular disease, unspecified: Secondary | ICD-10-CM

## 2013-03-02 NOTE — Progress Notes (Signed)
HPI The patient presents for evaluation of nonobstructive coronary disease and diastolic heart failure and atrial fibrillation.  Since I last saw the patient she has been having new chest discomfort. This seems to happen sporadically. It's in her left upper chest. She may or may not notice it with her increased heart rate. She does get some throat discomfort. She also describes episodes of her heart beating so fast that it makes her arms and chest jumb.  She is limited in her activities but she does feel increased heart rate with her current level of activity which includes walking with pain. She is limited by obesity and severe lymphedema.   She is not describing new PND or orthopnea.  Allergies  Allergen Reactions  . Codeine     Current Outpatient Prescriptions  Medication Sig Dispense Refill  . alendronate (FOSAMAX) 70 MG tablet Take 1 tablet by mouth every 7 (seven) days.       Marland Kitchen CLEVER CHEK AUTO-CODE VOICE test strip       . diltiazem (DILACOR XR) 180 MG 24 hr capsule TAKE ONE CAPSULE BY MOUTH DAILY  30 capsule  11  . fish oil-omega-3 fatty acids 1000 MG capsule Take 2 g by mouth daily.        . furosemide (LASIX) 20 MG tablet Take 2 tablets by mouth Daily.      Marland Kitchen KLOR-CON M20 20 MEQ tablet TAKE ONE TABLET BY MOUTH EVERY DAY  30 tablet  3  . Lancets MISC       . LEVEMIR FLEXPEN 100 UNIT/ML injection Inject 43 Units into the skin Twice daily. After breadfast and at 9pm      . lisinopril (PRINIVIL,ZESTRIL) 20 MG tablet       . metFORMIN (GLUCOPHAGE) 500 MG tablet Take 500 mg by mouth 2 (two) times daily with a meal.        . NON FORMULARY cpap at night      . rosuvastatin (CRESTOR) 10 MG tablet Take 10 mg by mouth daily.      Marland Kitchen SPIRIVA HANDIHALER 18 MCG inhalation capsule Take 1 capsule by mouth Daily. As mneeded      . warfarin (COUMADIN) 5 MG tablet Take 5 mg by mouth Daily.       No current facility-administered medications for this visit.    Past Medical History  Diagnosis  Date  . Anemia   . Arthritis   . Asthma   . Cataract     had surgery  . CHF (congestive heart failure)   . Diabetes mellitus   . Hyperlipidemia   . Hypertension   . CAD (coronary artery disease)     LAD 60% proximal stenosis mid and distal 60% stenosis 2009.  . Stroke     2 mini  . Oxygen dependent     2 liter per nasal cannula  . Gait instability     uses cane  . GERD (gastroesophageal reflux disease)   . Obesity   . Sleep apnea, obstructive 2008  . Atrial fibrillation     Past Surgical History  Procedure Laterality Date  . Cataract extraction w/ intraocular lens  implant, bilateral    . Appendectomy    . Cholecystectomy    . Carpal tunnel release      right hand  . Cervical spine surgery      fusion  . Cesarean section    . Umbilical hernia repair    . Knee arthroscopy  twice, left  . Colonoscopy  11/22/2002    normal - Dr.Rrourk  . Upper gastrointestinal endoscopy  11/22/2002    Normal - Dr. Gala Romney  . Colonoscopy  11/26/2005    incomplete with negative BE (Dr. Rowe Pavy, [place])    ROS:   Occasional blood in stool with positive guiaic.  Otherwise as stated in the HPI and negative for all other systems.  PHYSICAL EXAM BP 127/58  Pulse 62  Ht 5\' 6"  (1.676 m)  Wt 301 lb (136.533 kg)  BMI 48.61 kg/m2 GENERAL:  Well appearing HEENT:  Pupils equal round and reactive, fundi not visualized, oral mucosa unremarkable NECK:  10 cm jugular venous distention at 45, waveform within normal limits, carotid upstroke brisk and symmetric, no bruits, no thyromegaly LYMPHATICS:  No cervical, inguinal adenopathy LUNGS:  Clear to auscultation bilaterally BACK:  No CVA tenderness CHEST:  Unremarkable HEART:  PMI not displaced or sustained,S1 and S2 within normal limits, no S3, no clicks, no rubs, no murmurs, irregular ABD:  Flat, positive bowel sounds normal in frequency in pitch, no bruits, no rebound, no guarding, no midline pulsatile mass, no hepatomegaly, no splenomegaly,  obese EXT:  2 plus pulses throughout, severe lymphadema, no cyanosis no clubbing SKIN:  No rashes no nodules NEURO:  Cranial nerves II through XII grossly intact, motor grossly intact throughout PSYCH:  Cognitively intact, oriented to person place and time  EKG:  Atrial fibrillation, rate 62, axis within normal limits, intervals within normal limits, no acute ST-T wave changes.  03/02/2013  ASSESSMENT AND PLAN   ATRIAL FIBRILLATION:  She describes some rapid rates.  Therefore, I will apply a 24 hour Holter monitor.  She is tolerating the warfarin although there has been some rare bleeding in her bowel movements.  I will check a CBC.  She is going to see a gastroenterologist.   CAD:  Her pain is atypical.  I not think that further ischemia workup is indicated given her negative stress test last year.  DIASTOLIC HF:  Her neck veins are elevated more than I recall. I will check an echocardiogram. For now she will continue the meds as listed

## 2013-03-02 NOTE — Patient Instructions (Addendum)
The current medical regimen is effective;  continue present plan and medications.  Your physician has requested that you have an echocardiogram. Echocardiography is a painless test that uses sound waves to create images of your heart. It provides your doctor with information about the size and shape of your heart and how well your heart's chambers and valves are working. This procedure takes approximately one hour. There are no restrictions for this procedure.  Your physician has recommended that you wear a holter monitor for 24 hours. Holter monitors are medical devices that record the heart's electrical activity. Doctors most often use these monitors to diagnose arrhythmias. Arrhythmias are problems with the speed or rhythm of the heartbeat. The monitor is a small, portable device. You can wear one while you do your normal daily activities. This is usually used to diagnose what is causing palpitations/syncope (passing out).  This can be put on at [facility] on 03/03/2013.  Please have blood work at [facility].  (CBC)  Follow up with Dr Percival Spanish in 3 months.

## 2013-03-03 ENCOUNTER — Ambulatory Visit (INDEPENDENT_AMBULATORY_CARE_PROVIDER_SITE_OTHER): Payer: Medicare HMO | Admitting: General Practice

## 2013-03-03 ENCOUNTER — Encounter: Payer: Self-pay | Admitting: General Practice

## 2013-03-03 ENCOUNTER — Other Ambulatory Visit: Payer: Self-pay | Admitting: *Deleted

## 2013-03-03 VITALS — BP 125/62 | HR 68 | Temp 97.0°F | Ht 66.0 in | Wt 298.0 lb

## 2013-03-03 DIAGNOSIS — I4891 Unspecified atrial fibrillation: Secondary | ICD-10-CM

## 2013-03-03 DIAGNOSIS — R0602 Shortness of breath: Secondary | ICD-10-CM

## 2013-03-03 LAB — POCT CBC
Granulocyte percent: 75.6 %G (ref 37–80)
HCT, POC: 29.1 % — AB (ref 37.7–47.9)
Hemoglobin: 9.5 g/dL — AB (ref 12.2–16.2)
Lymph, poc: 1.6 (ref 0.6–3.4)
MCH, POC: 26.6 pg — AB (ref 27–31.2)
MCHC: 32.8 g/dL (ref 31.8–35.4)
MCV: 81.2 fL (ref 80–97)
MPV: 8.6 fL (ref 0–99.8)
POC Granulocyte: 7.2 — AB (ref 2–6.9)
POC LYMPH PERCENT: 17.1 %L (ref 10–50)
Platelet Count, POC: 166 10*3/uL (ref 142–424)
RBC: 3.6 M/uL — AB (ref 4.04–5.48)
RDW, POC: 19.1 %
WBC: 9.5 10*3/uL (ref 4.6–10.2)

## 2013-03-03 LAB — POCT INR: INR: 3

## 2013-03-03 NOTE — Progress Notes (Signed)
24 hr Holter Monitor placed on patient.  Instructions and diary given.  Patient will return in 24 hours to have monitor disconnected.

## 2013-03-03 NOTE — Patient Instructions (Addendum)
Anticoagulation Dose Instructions as of 03/03/2013     Dorene Grebe Tue Wed Thu Fri Sat   New Dose 5 mg 7.5 mg 5 mg 7.5 mg 5 mg 7.5 mg 5 mg     Anticoagulation, Generic Anticoagulants are medications used to prevent clots from developing in your veins. These medications are also known as blood thinners. If blood clots are untreated, they could travel to your lungs. This is called a pulmonary embolus. A blood clot in your lungs can be fatal.  Caregivers often use anticoagulants to prevent clots following surgery. Anticoagulants are also used along with aspirin when the heart is not getting enough blood. Another anticoagulant called warfarin is started 2 to 3 days after a rapid-acting injectable anticoagulant is started. The rapid-acting anticoagulants are usually continued until warfarin has begun to work. Your caregiver will judge this length of time by blood tests known as the prothrombin time (PT) and International Normalization Ratio (INR). This means that your blood is at the necessary and best level to prevent clots. RISKS AND COMPLICATIONS  If you have received recent epidural anesthesia, spinal anesthesia, or a spinal tap while receiving anticoagulants, you are at risk for developing a blood clot in or around the spine. This condition could result in long-term or permanent paralysis.  Because anticoagulants thin your blood, severe bleeding may occur from any tissue or organ. Symptoms of the blood being too thin may include:  Bleeding from the nose or gums that does not stop quickly.  Unusual bruising or bruising easily.  Swelling or pain at an injection site.  A cut that does not stop bleeding within 10 minutes.  Continual nausea for more than 1 day or vomiting blood.  Coughing up blood.  Blood in the urine which may appear as pink, red, or brown urine.  Blood in bowel movements which may appear as red, dark or black stools.  Sudden weakness or numbness of the face, arm, or leg,  especially on one side of the body.  Sudden confusion.  Trouble speaking (aphasia) or understanding.  Sudden trouble seeing in one or both eyes.  Sudden trouble walking.  Dizziness.  Loss of balance or coordination.  Severe pain, such as a headache, joint pain, or back pain.  Fever.  Too little anticoagulation continues to allow the risk for blood clots. HOME CARE INSTRUCTIONS   Due to the complications of anticoagulants, it is very important that you take your anticoagulant as directed by your caregiver. Anticoagulants need to be taken exactly as instructed. Be sure you understand all your anticoagulant instructions.  Warfarin. Your caregiver will advise you on the length of treatment (usually 3 6 months, sometimes lifelong).  Take warfarin exactly as directed by your caregiver. It is recommended that you take your warfarin dose at the same time of the day. It is preferred that you take warfarin in the late afternoon. If you have been told to stop taking warfarin, do not resume taking warfarin until directed to do so by your caregiver. Follow your caregiver's instructions if you accidentally take an extra dose or miss a dose of warfarin. It is very important to take warfarin as directed since bleeding or blood clots could result in chronic or permanent injury, pain, or disability.  Too much and too little warfarin are both dangerous. Too much warfarin increases the risk of bleeding. Too little warfarin continues to allow the risk for blood clots. While taking warfarin, you will need to have regular blood tests to measure  your blood clotting time. These blood tests usually include both the PT and INR tests. The PT and INR results allow your caregiver to adjust your dose of warfarin. The dose can change for many reasons. It is critically important that you take warfarin exactly as prescribed, and that you have your PT and INR levels drawn exactly as directed. Follow up with your laboratory  test appointments as directed. It is very important to keep your lab appointments. Not keeping lab appointments could result in a chronic or permanent injury, pain, or disability.  Many foods, especially foods high in vitamin K can interfere with warfarin and affect the PT and INR results. Foods high in vitamin K include spinach, kale, broccoli, cabbage, collard and turnip greens, brussels sprouts, peas, cauliflower, seaweed, and parsley as well as beef and pork liver, green tea, and soybean oil. You should eat a consistent amount of foods high in vitamin K. Avoid major changes in your diet, or notify your caregiver before changing your diet. Arrange a visit with a dietitian to answer your questions.  Many medicines can interfere with warfarin and affect the PT and INR results. You must tell your caregiver about any and all medicines you take, this includes all vitamins and supplements. You must tell your caregiver about any and all medicines you take, this includes all vitamins and supplements. Ask your caregiver before taking these. Prescription and over-the-counter medicine consistency is critical to warfarin management. It is important that potential interactions are checked before you start a new medicine. Be especially cautious with aspirin and anti-inflammatory medicines. Ask your caregiver before taking these. Medicines such as antibiotics and acid-reducing medicine can interact with warfarin and can cause an increased warfarin effect. Warfarin can also interfere with the effectiveness of medicines you are taking. Do not take or discontinue any prescribed or over-the-counter medicine except on the advice of your caregiver or pharmacist.  Some vitamins, supplements, and herbal products interfere with the effectiveness of warfarin. Vitamin E may increase the anticoagulant effects of warfarin. Vitamin K may can cause warfarin to be less effective. Do not take or discontinue any vitamin, supplement, or  herbal product except on the advice of your caregiver or pharmacist.  Alcohol can change the body's ability to handle warfarin. It is best to avoid alcoholic drinks or consume only very small amounts while taking warfarin. Notify your caregiver if you change your alcohol intake. A sudden increase in alcohol use can increase your risk of bleeding. Chronic alcohol use can cause warfarin to be less effective.  If you have a loss of appetite or get the stomach flu (viral gastroenteritis), talk to your caregiver as soon as possible. A decrease in your normal vitamin K intake can make you more sensitive to your usual dose of warfarin.  Some medical conditions may increase your risk for bleeding while you are taking warfarin. A fever, diarrhea lasting more than a day, worsening heart failure, or worsening liver function are some medical conditions that could affect warfarin. Contact your caregiver if you have any of these medical conditions.  Warfarin can have side effects, such as excessive bruising or bleeding. You will need to hold pressure over cuts for longer than usual.  Be careful not to cut yourself when using sharp objects.  Notify your dentist or other caregivers before procedures.  Limit physical activities or sports that could result in a fall or cause injury. Avoid contact sports.  Wear a medical alert bracelet or carry a medical alert  card. SEEK MEDICAL CARE IF:   You develop any rashes.  You have any worsening of the condition for which you are receiving anticoagulation therapy. SEEK IMMEDIATE MEDICAL CARE IF:   Bleeding from the nose or gums does not stop quickly.  You have unusual bruising or are bruising easily.  Swelling or pain occurs at an injection site.  A cut does not stop bleeding within 10 minutes.  You have continual nausea for more than 1 day or are vomiting blood.  You are coughing up blood.  You have blood in the urine.  You have dark or black  stools.  You have sudden weakness or numbness of the face, arm, or leg, especially on one side of the body.  You have sudden confusion.  You have trouble speaking (aphasia) or understanding.  You have sudden trouble seeing in one or both eyes.  You have sudden trouble walking.  You have dizziness.  You have a loss of balance or coordination.  You have severe pain, such as a headache, joint pain, or back pain.  You have a serious fall or head injury, even if you are not bleeding.  You have an oral temperature above 102 F (38.9 C), not controlled by medicine. ANY OF THESE SYMPTOMS MAY REPRESENT A SERIOUS PROBLEM THAT IS AN EMERGENCY. Do not wait to see if the symptoms will go away. Get medical help right away. Call your local emergency services (911 in [place] DO NOT drive yourself to the hospital. MAKE SURE YOU:   Understand these instructions.  Will watch your condition.  Will get help right away if you are not doing well or get worse. Document Released: 11/17/2005 Document Revised: 05/18/2012 Document Reviewed: 06/21/2008 [facility] Patient Information 2013 [place].   Holter Monitoring A Holter monitor is a small device with electrodes (small sticky patches) that attach to your chest. It records the electrical activity of your heart and is worn continuously for 24-48 hours.  A HOLTER MONITOR IS USED TO  Detect heart problems such as:  Heart arrhythmia. Is an abnormal or irregular heartbeat. With some heart arrhythmias, you may not feel or know that you have an irregular heart rhythm.  Palpitations, such as feeling your heart racing or fluttering. It is possible to have heart palpitations and not have a heart arrhythmia.  A heart rhythm that is too slow or too fast.  If you have problems fainting, near fainting or feeling light-headed, a Holter monitor may be worn to see if your heart is the cause. HOLTER MONITOR PREPARATION   Electrodes will be attached to the  skin on your chest.  If you have hair on your chest, small areas may have to be shaved. This is done to help the patches stick better and make the recording more accurate.  The electrodes are attached by wires to the Holter monitor. The Holter monitor clips to your clothing. You will wear the monitor at all times, even while exercising and sleeping. HOME CARE INSTRUCTIONS   Wear your monitor at all times.  The wires and the monitor must stay dry. Do not get the monitor wet.  Do not bathe, swim or use a hot tub with it on.  You may do a "sponge" bath while you have the monitor on.  Keep your skin clean, do not put body lotion or moisturizer on your chest.  It's possible that your skin under the electrodes could become irritated. To keep this from happening, you may put the electrodes  in slightly different places on your chest.  Your caregiver will also ask you to keep a diary of your activities, such as walking or doing chores. Be sure to note what you are doing if you experience heart symptoms such as palpitations. This will help your caregiver determine what might be contributing to your symptoms. The information stored in your monitor will be reviewed by your caregiver alongside your diary entries.  Make sure the monitor is safely clipped to your clothing or in a location close to your body that your caregiver recommends.  The monitor and electrodes are removed when the test is over. Return the monitor as directed.  Be sure to follow up with your caregiver and discuss your Holter monitor results. SEEK IMMEDIATE MEDICAL CARE IF:  You faint or feel lightheaded.  You have trouble breathing.  You get pain in your chest, upper arm or jaw.  You feel sick to your stomach and your skin is pale, cool, or damp.  You think something is wrong with the way your heart is beating. MAKE SURE YOU:   Understand these instructions.  Will watch your condition.  Will get help right away if  you are not doing well or get worse. Document Released: 08/15/2004 Document Revised: 02/09/2012 Document Reviewed: 12/28/2008 [facility] Patient Information 2013 [place].

## 2013-03-03 NOTE — Progress Notes (Signed)
  Subjective:    Patient ID: [name], female    DOB: 06/23/1946, 67 y.o.   MRN: [id number]  HPI patient presents today for INR recheck. Denies bleeding, change in diet, bruising. Also reports taking medications as prescribed. Also here to be placed on holter heart monitor per cardiologist. Patient was having palpations. Patient appears to be in no distress at this time.    Review of Systems  Constitutional: Negative for fever and chills.  Respiratory: Positive for shortness of breath. Negative for chest tightness.        Patient wears continuous oxygen at 2 liters via nasal canula  Gastrointestinal: Negative for blood in stool.  Genitourinary: Negative for difficulty urinating.  Psychiatric/Behavioral: Negative.        Objective:   Physical Exam  Constitutional: She is oriented to person, place, and time. She appears well-developed and well-nourished.  Cardiovascular: Normal rate.   Irregular heart rhythm   Neurological: She is alert and oriented to person, place, and time.  Skin: Skin is warm and dry.  Psychiatric: She has a normal mood and affect.   Results for orders placed in visit on 03/03/13  POCT CBC      Result Value Range   WBC 9.5  4.6 - 10.2 K/uL   Lymph, poc 1.6  0.6 - 3.4   POC LYMPH PERCENT 17.1  10 - 50 %L   POC Granulocyte 7.2 (*) 2 - 6.9   Granulocyte percent 75.6  37 - 80 %G   RBC 3.6 (*) 4.04 - 5.48 M/uL   Hemoglobin 9.5 (*) 12.2 - 16.2 g/dL   HCT, POC 29.1 (*) 37.7 - 47.9 %   MCV 81.2  80 - 97 fL   MCH, POC 26.6 (*) 27 - 31.2 pg   MCHC 32.8  31.8 - 35.4 g/dL   RDW, POC 19.1     Platelet Count, POC 166.0  142 - 424 K/uL   MPV 8.6  0 - 99.8 fL  POCT INR      Result Value Range   INR 3.0           Assessment & Plan:  Continue current medications Continue current healthy eating habits Physical activity increased as tolerated RTO if symptoms worsen and in 2 weeks for INR recheck Patient verbalized understanding and denies  questions  Almyra Free, FNP-C

## 2013-03-04 NOTE — Progress Notes (Signed)
Patient returned with 24 hr holter monitor and monitor downloaded and sent to [facility]

## 2013-03-07 NOTE — Progress Notes (Signed)
24 holter results received from [place] [facility]. Document scanned into Epic and results sent to Dr. Cherlyn Cushing inbasket for review. Patient notified that results have been sent to Dr. Percival Spanish and that his office should call her with the results.

## 2013-03-14 ENCOUNTER — Telehealth: Payer: Self-pay | Admitting: General Practice

## 2013-03-16 ENCOUNTER — Other Ambulatory Visit (HOSPITAL_COMMUNITY): Payer: Self-pay

## 2013-03-17 ENCOUNTER — Other Ambulatory Visit: Payer: Self-pay | Admitting: Pharmacist

## 2013-03-17 ENCOUNTER — Ambulatory Visit (INDEPENDENT_AMBULATORY_CARE_PROVIDER_SITE_OTHER): Payer: Medicare HMO | Admitting: Pharmacist

## 2013-03-17 DIAGNOSIS — I4891 Unspecified atrial fibrillation: Secondary | ICD-10-CM

## 2013-03-17 MED ORDER — ALENDRONATE SODIUM 70 MG PO TABS: 70.0000 mg | ORAL_TABLET | ORAL | Status: DC

## 2013-03-17 NOTE — Patient Instructions (Signed)
Anticoagulation Dose Instructions as of 03/17/2013     Dorene Grebe Tue Wed Thu Fri Sat   New Dose 5 mg 7.5 mg 5 mg 5 mg 5 mg 7.5 mg 5 mg    Description       Hold warfarin today, take 1/2 tablet tomorrow, then start new dose of 5mg  daily except 7.5mg  [= 1 and 1/2 ] tablets on mondays and fridays       INR was 4.2 today

## 2013-03-21 ENCOUNTER — Encounter: Payer: Self-pay | Admitting: *Deleted

## 2013-04-04 ENCOUNTER — Ambulatory Visit (INDEPENDENT_AMBULATORY_CARE_PROVIDER_SITE_OTHER): Payer: Medicare HMO | Admitting: Pharmacist

## 2013-04-04 DIAGNOSIS — I4891 Unspecified atrial fibrillation: Secondary | ICD-10-CM

## 2013-04-04 LAB — POCT INR: INR: 3

## 2013-04-04 MED ORDER — OXYCODONE-ACETAMINOPHEN 10-325 MG PO TABS: 1.0000 | ORAL_TABLET | Freq: Three times a day (TID) | ORAL | Status: DC | PRN

## 2013-04-04 NOTE — Patient Instructions (Signed)
Anticoagulation Dose Instructions as of 04/04/2013     [name] Tue Wed Thu Fri Sat   New Dose 5 mg 7.5 mg 5 mg 5 mg 5 mg 7.5 mg 5 mg    Description       Continue same dose of 5mg  daily except 7.5mg  [= 1 and 1/2 ] tablets on mondays and fridays

## 2013-04-04 NOTE — Progress Notes (Addendum)
Patient requested Rx for oxycodone/APAP 10/325mg  - she has been taking since her fall 03/17/2013.  Originally prescribed #30 tablets by ER physician.  Her rx bottle still has 2 tablets in it today.  Discussed with her PCP Vicenta Aly, NP who wrote Rx for #30 of Oxycodone/APAP 10/325mg  take 1 tablet up to every 8 hours as needed for pain.    Missed LOS charge for visit

## 2013-04-05 ENCOUNTER — Other Ambulatory Visit: Payer: Self-pay | Admitting: *Deleted

## 2013-04-05 MED ORDER — ROSUVASTATIN CALCIUM 10 MG PO TABS: 10.0000 mg | ORAL_TABLET | Freq: Every day | ORAL | Status: DC

## 2013-04-05 NOTE — Addendum Note (Signed)
Addended by: Cherre Robins on: 04/05/2013 01:03 PM   Modules accepted: Level of Service

## 2013-04-05 NOTE — Telephone Encounter (Signed)
LAST LABS 10/13

## 2013-04-06 NOTE — Telephone Encounter (Signed)
error 

## 2013-04-18 ENCOUNTER — Other Ambulatory Visit: Payer: Self-pay | Admitting: Family Medicine

## 2013-05-05 ENCOUNTER — Ambulatory Visit (INDEPENDENT_AMBULATORY_CARE_PROVIDER_SITE_OTHER): Payer: Medicare HMO | Admitting: Pharmacist

## 2013-05-05 DIAGNOSIS — I4891 Unspecified atrial fibrillation: Secondary | ICD-10-CM

## 2013-05-05 NOTE — Patient Instructions (Signed)
Anticoagulation Dose Instructions as of 05/05/2013     [name] Tue Wed Thu Fri Sat   New Dose 5 mg 5 mg 5 mg 5 mg 5 mg 5 mg 5 mg    Description       Hold warfarin 1 day (05/05/13) then take 1/2 tablet tomorrow (05/06/13).  Then start new dose of 5mg  1 tablet daily       INR was 4.4 today

## 2013-05-06 ENCOUNTER — Inpatient Hospital Stay (HOSPITAL_COMMUNITY)
Admission: EM | Admit: 2013-05-06 | Discharge: 2013-05-12 | DRG: 480 | Disposition: A | Discharge status: Skilled Nursing Facility | Payer: Medicare PPO | Attending: Internal Medicine | Admitting: Internal Medicine

## 2013-05-06 ENCOUNTER — Emergency Department (HOSPITAL_COMMUNITY): Payer: Medicare PPO

## 2013-05-06 ENCOUNTER — Encounter (HOSPITAL_COMMUNITY): Payer: Self-pay | Admitting: Emergency Medicine

## 2013-05-06 DIAGNOSIS — Z8673 Personal history of transient ischemic attack (TIA), and cerebral infarction without residual deficits: Secondary | ICD-10-CM

## 2013-05-06 DIAGNOSIS — E669 Obesity, unspecified: Secondary | ICD-10-CM

## 2013-05-06 DIAGNOSIS — G4733 Obstructive sleep apnea (adult) (pediatric): Secondary | ICD-10-CM | POA: Diagnosis present

## 2013-05-06 DIAGNOSIS — R609 Edema, unspecified: Secondary | ICD-10-CM

## 2013-05-06 DIAGNOSIS — E785 Hyperlipidemia, unspecified: Secondary | ICD-10-CM | POA: Diagnosis present

## 2013-05-06 DIAGNOSIS — D500 Iron deficiency anemia secondary to blood loss (chronic): Secondary | ICD-10-CM

## 2013-05-06 DIAGNOSIS — R296 Repeated falls: Secondary | ICD-10-CM | POA: Diagnosis present

## 2013-05-06 DIAGNOSIS — S7223XA Displaced subtrochanteric fracture of unspecified femur, initial encounter for closed fracture: Principal | ICD-10-CM

## 2013-05-06 DIAGNOSIS — I5033 Acute on chronic diastolic (congestive) heart failure: Secondary | ICD-10-CM | POA: Diagnosis present

## 2013-05-06 DIAGNOSIS — IMO0001 Reserved for inherently not codable concepts without codable children: Secondary | ICD-10-CM

## 2013-05-06 DIAGNOSIS — Z9981 Dependence on supplemental oxygen: Secondary | ICD-10-CM

## 2013-05-06 DIAGNOSIS — S72009A Fracture of unspecified part of neck of unspecified femur, initial encounter for closed fracture: Secondary | ICD-10-CM

## 2013-05-06 DIAGNOSIS — S72002A Fracture of unspecified part of neck of left femur, initial encounter for closed fracture: Secondary | ICD-10-CM

## 2013-05-06 DIAGNOSIS — Z7901 Long term (current) use of anticoagulants: Secondary | ICD-10-CM

## 2013-05-06 DIAGNOSIS — D696 Thrombocytopenia, unspecified: Secondary | ICD-10-CM

## 2013-05-06 DIAGNOSIS — I2510 Atherosclerotic heart disease of native coronary artery without angina pectoris: Secondary | ICD-10-CM | POA: Diagnosis present

## 2013-05-06 DIAGNOSIS — G4730 Sleep apnea, unspecified: Secondary | ICD-10-CM

## 2013-05-06 DIAGNOSIS — S72002S Fracture of unspecified part of neck of left femur, sequela: Secondary | ICD-10-CM

## 2013-05-06 DIAGNOSIS — I10 Essential (primary) hypertension: Secondary | ICD-10-CM | POA: Diagnosis present

## 2013-05-06 DIAGNOSIS — J449 Chronic obstructive pulmonary disease, unspecified: Secondary | ICD-10-CM

## 2013-05-06 DIAGNOSIS — J4489 Other specified chronic obstructive pulmonary disease: Secondary | ICD-10-CM | POA: Diagnosis present

## 2013-05-06 DIAGNOSIS — D649 Anemia, unspecified: Secondary | ICD-10-CM

## 2013-05-06 DIAGNOSIS — K219 Gastro-esophageal reflux disease without esophagitis: Secondary | ICD-10-CM | POA: Diagnosis present

## 2013-05-06 DIAGNOSIS — J9610 Chronic respiratory failure, unspecified whether with hypoxia or hypercapnia: Secondary | ICD-10-CM | POA: Diagnosis present

## 2013-05-06 DIAGNOSIS — C439 Malignant melanoma of skin, unspecified: Secondary | ICD-10-CM

## 2013-05-06 DIAGNOSIS — I270 Primary pulmonary hypertension: Secondary | ICD-10-CM

## 2013-05-06 DIAGNOSIS — S7222XA Displaced subtrochanteric fracture of left femur, initial encounter for closed fracture: Secondary | ICD-10-CM

## 2013-05-06 DIAGNOSIS — I739 Peripheral vascular disease, unspecified: Secondary | ICD-10-CM

## 2013-05-06 DIAGNOSIS — I509 Heart failure, unspecified: Secondary | ICD-10-CM

## 2013-05-06 DIAGNOSIS — R072 Precordial pain: Secondary | ICD-10-CM

## 2013-05-06 DIAGNOSIS — Z1211 Encounter for screening for malignant neoplasm of colon: Secondary | ICD-10-CM

## 2013-05-06 DIAGNOSIS — I4891 Unspecified atrial fibrillation: Secondary | ICD-10-CM

## 2013-05-06 DIAGNOSIS — E119 Type 2 diabetes mellitus without complications: Secondary | ICD-10-CM

## 2013-05-06 DIAGNOSIS — K5732 Diverticulitis of large intestine without perforation or abscess without bleeding: Secondary | ICD-10-CM | POA: Diagnosis present

## 2013-05-06 DIAGNOSIS — D72829 Elevated white blood cell count, unspecified: Secondary | ICD-10-CM

## 2013-05-06 DIAGNOSIS — J984 Other disorders of lung: Secondary | ICD-10-CM

## 2013-05-06 HISTORY — DX: Chronic obstructive pulmonary disease, unspecified: J44.9

## 2013-05-06 LAB — CBC WITH DIFFERENTIAL/PLATELET
Basophils Relative: 0 % (ref 0–1)
Eosinophils Absolute: 0 10*3/uL (ref 0.0–0.7)
HCT: 27.6 % — ABNORMAL LOW (ref 36.0–46.0)
Hemoglobin: 8.4 g/dL — ABNORMAL LOW (ref 12.0–15.0)
Lymphs Abs: 0.7 10*3/uL (ref 0.7–4.0)
MCH: 26.2 pg (ref 26.0–34.0)
MCHC: 30.4 g/dL (ref 30.0–36.0)
Monocytes Absolute: 0.6 10*3/uL (ref 0.1–1.0)
Monocytes Relative: 4 % (ref 3–12)
Neutro Abs: 13.7 10*3/uL — ABNORMAL HIGH (ref 1.7–7.7)
Neutrophils Relative %: 91 % — ABNORMAL HIGH (ref 43–77)
RBC: 3.21 MIL/uL — ABNORMAL LOW (ref 3.87–5.11)

## 2013-05-06 LAB — COMPREHENSIVE METABOLIC PANEL
Albumin: 3.3 g/dL — ABNORMAL LOW (ref 3.5–5.2)
Alkaline Phosphatase: 144 U/L — ABNORMAL HIGH (ref 39–117)
BUN: 20 mg/dL (ref 6–23)
Chloride: 105 mEq/L (ref 96–112)
Creatinine, Ser: 0.88 mg/dL (ref 0.50–1.10)
GFR calc Af Amer: 76 mL/min — ABNORMAL LOW (ref >90)
GFR calc non Af Amer: 66 mL/min — ABNORMAL LOW (ref >90)
Glucose, Bld: 153 mg/dL — ABNORMAL HIGH (ref 70–99)
Potassium: 4.2 mEq/L (ref 3.5–5.1)
Total Bilirubin: 0.7 mg/dL (ref 0.3–1.2)

## 2013-05-06 LAB — URINE MICROSCOPIC-ADD ON

## 2013-05-06 LAB — URINALYSIS, ROUTINE W REFLEX MICROSCOPIC
Glucose, UA: NEGATIVE mg/dL
Ketones, ur: NEGATIVE mg/dL
Leukocytes, UA: NEGATIVE
Nitrite: NEGATIVE
Urobilinogen, UA: 0.2 mg/dL (ref 0.0–1.0)

## 2013-05-06 LAB — PROTIME-INR: Prothrombin Time: 33.2 seconds — ABNORMAL HIGH (ref 11.6–15.2)

## 2013-05-06 MED ORDER — FUROSEMIDE 20 MG PO TABS
20.0000 mg | ORAL_TABLET | Freq: Three times a day (TID) | ORAL | Status: DC
  Administered 2013-05-07 (×3): 20 mg via ORAL
  Filled 2013-05-06 (×7): qty 1

## 2013-05-06 MED ORDER — DILTIAZEM HCL ER COATED BEADS 180 MG PO CP24
180.0000 mg | ORAL_CAPSULE | Freq: Every day | ORAL | Status: DC
  Administered 2013-05-07 – 2013-05-12 (×6): 180 mg via ORAL
  Filled 2013-05-06 (×6): qty 1

## 2013-05-06 MED ORDER — LISINOPRIL 10 MG PO TABS
20.0000 mg | ORAL_TABLET | Freq: Every day | ORAL | Status: DC
  Administered 2013-05-07: 20 mg via ORAL
  Filled 2013-05-06: qty 2

## 2013-05-06 MED ORDER — INSULIN ASPART 100 UNIT/ML ~~LOC~~ SOLN
0.0000 [IU] | SUBCUTANEOUS | Status: DC
  Administered 2013-05-07 (×2): 4 [IU] via SUBCUTANEOUS
  Administered 2013-05-07: 3 [IU] via SUBCUTANEOUS
  Administered 2013-05-07 (×2): 4 [IU] via SUBCUTANEOUS
  Administered 2013-05-07 – 2013-05-08 (×2): 3 [IU] via SUBCUTANEOUS
  Administered 2013-05-08 (×2): 4 [IU] via SUBCUTANEOUS
  Administered 2013-05-08 (×2): 3 [IU] via SUBCUTANEOUS
  Administered 2013-05-09 (×2): 4 [IU] via SUBCUTANEOUS
  Administered 2013-05-09: 7 [IU] via SUBCUTANEOUS
  Administered 2013-05-09: 4 [IU] via SUBCUTANEOUS
  Administered 2013-05-09: 3 [IU] via SUBCUTANEOUS

## 2013-05-06 MED ORDER — ONDANSETRON HCL 4 MG/2ML IJ SOLN
4.0000 mg | Freq: Once | INTRAMUSCULAR | Status: AC
  Administered 2013-05-06: 4 mg via INTRAVENOUS
  Filled 2013-05-06: qty 2

## 2013-05-06 MED ORDER — ATORVASTATIN CALCIUM 20 MG PO TABS
20.0000 mg | ORAL_TABLET | Freq: Every day | ORAL | Status: DC
  Administered 2013-05-07 – 2013-05-11 (×4): 20 mg via ORAL
  Filled 2013-05-06 (×6): qty 1

## 2013-05-06 MED ORDER — MORPHINE SULFATE 4 MG/ML IJ SOLN: 4.0000 mg | INTRAMUSCULAR | Status: DC | PRN

## 2013-05-06 MED ORDER — ONDANSETRON HCL 4 MG/2ML IJ SOLN: 4.0000 mg | Freq: Three times a day (TID) | INTRAMUSCULAR | Status: AC | PRN

## 2013-05-06 MED ORDER — MORPHINE SULFATE 10 MG/ML IJ SOLN
10.0000 mg | Freq: Once | INTRAMUSCULAR | Status: AC
  Administered 2013-05-06: 10 mg via INTRAVENOUS
  Filled 2013-05-06: qty 1

## 2013-05-06 MED ORDER — HYDROCODONE-ACETAMINOPHEN 5-325 MG PO TABS
1.0000 | ORAL_TABLET | ORAL | Status: AC | PRN
  Administered 2013-05-07 (×2): 2 via ORAL
  Filled 2013-05-06 (×2): qty 2

## 2013-05-06 MED ORDER — PHYTONADIONE 5 MG PO TABS
5.0000 mg | ORAL_TABLET | Freq: Once | ORAL | Status: AC
  Administered 2013-05-06: 5 mg via ORAL
  Filled 2013-05-06: qty 1

## 2013-05-06 MED ORDER — OXYCODONE-ACETAMINOPHEN 5-325 MG PO TABS
2.0000 | ORAL_TABLET | Freq: Three times a day (TID) | ORAL | Status: DC | PRN
  Administered 2013-05-07 – 2013-05-08 (×2): 2 via ORAL
  Filled 2013-05-06 (×2): qty 2

## 2013-05-06 MED ORDER — MORPHINE SULFATE 2 MG/ML IJ SOLN
0.5000 mg | INTRAMUSCULAR | Status: DC | PRN
  Administered 2013-05-07 (×2): 0.5 mg via INTRAVENOUS
  Filled 2013-05-06 (×2): qty 1

## 2013-05-06 MED ORDER — BIOTENE DRY MOUTH MT LIQD
15.0000 mL | Freq: Two times a day (BID) | OROMUCOSAL | Status: DC
  Administered 2013-05-07 – 2013-05-12 (×11): 15 mL via OROMUCOSAL

## 2013-05-06 MED ORDER — SODIUM CHLORIDE 0.9 % IV SOLN
Freq: Once | INTRAVENOUS | Status: AC
  Administered 2013-05-06: 1000 mL via INTRAVENOUS
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
## 2013-05-06 NOTE — ED Notes (Signed)
Pt complaining of left hip pain & left elbow pain. Bruise noted to the left elbow.

## 2013-05-07 ENCOUNTER — Encounter (HOSPITAL_COMMUNITY): Payer: Self-pay | Admitting: Orthopedic Surgery

## 2013-05-07 ENCOUNTER — Inpatient Hospital Stay (HOSPITAL_COMMUNITY): Payer: Medicare PPO

## 2013-05-07 DIAGNOSIS — J4489 Other specified chronic obstructive pulmonary disease: Secondary | ICD-10-CM

## 2013-05-07 DIAGNOSIS — D500 Iron deficiency anemia secondary to blood loss (chronic): Secondary | ICD-10-CM

## 2013-05-07 DIAGNOSIS — E669 Obesity, unspecified: Secondary | ICD-10-CM

## 2013-05-07 DIAGNOSIS — J449 Chronic obstructive pulmonary disease, unspecified: Secondary | ICD-10-CM

## 2013-05-07 DIAGNOSIS — I739 Peripheral vascular disease, unspecified: Secondary | ICD-10-CM

## 2013-05-07 DIAGNOSIS — D696 Thrombocytopenia, unspecified: Secondary | ICD-10-CM

## 2013-05-07 DIAGNOSIS — D72829 Elevated white blood cell count, unspecified: Secondary | ICD-10-CM

## 2013-05-07 LAB — BASIC METABOLIC PANEL
Chloride: 107 mEq/L (ref 96–112)
GFR calc Af Amer: 82 mL/min — ABNORMAL LOW (ref >90)
GFR calc non Af Amer: 70 mL/min — ABNORMAL LOW (ref >90)
Potassium: 5.3 mEq/L — ABNORMAL HIGH (ref 3.5–5.1)
Sodium: 140 mEq/L (ref 135–145)

## 2013-05-07 LAB — GLUCOSE, CAPILLARY
Glucose-Capillary: 153 mg/dL — ABNORMAL HIGH (ref 70–99)
Glucose-Capillary: 159 mg/dL — ABNORMAL HIGH (ref 70–99)
Glucose-Capillary: 165 mg/dL — ABNORMAL HIGH (ref 70–99)
Glucose-Capillary: 174 mg/dL — ABNORMAL HIGH (ref 70–99)

## 2013-05-07 LAB — SURGICAL PCR SCREEN
MRSA, PCR: NEGATIVE
Staphylococcus aureus: POSITIVE — AB

## 2013-05-07 LAB — CBC
HCT: 26.2 % — ABNORMAL LOW (ref 36.0–46.0)
Platelets: 140 10*3/uL — ABNORMAL LOW (ref 150–400)
RDW: 16.3 % — ABNORMAL HIGH (ref 11.5–15.5)
WBC: 10.6 10*3/uL — ABNORMAL HIGH (ref 4.0–10.5)

## 2013-05-07 LAB — HEMOGLOBIN A1C
Hgb A1c MFr Bld: 5.9 % — ABNORMAL HIGH (ref <5.7)
Mean Plasma Glucose: 123 mg/dL — ABNORMAL HIGH (ref <117)

## 2013-05-07 MED ORDER — CHLORHEXIDINE GLUCONATE 4 % EX LIQD
60.0000 mL | Freq: Once | CUTANEOUS | Status: AC
  Administered 2013-05-08: 4 via TOPICAL
  Filled 2013-05-07: qty 60

## 2013-05-07 MED ORDER — FUROSEMIDE 10 MG/ML IJ SOLN
40.0000 mg | Freq: Once | INTRAMUSCULAR | Status: AC
  Administered 2013-05-07: 40 mg via INTRAVENOUS
  Filled 2013-05-07: qty 4

## 2013-05-07 MED ORDER — FUROSEMIDE 10 MG/ML IJ SOLN: 20.0000 mg | Freq: Once | INTRAMUSCULAR | Status: DC

## 2013-05-07 MED ORDER — PHYTONADIONE 5 MG PO TABS
5.0000 mg | ORAL_TABLET | Freq: Once | ORAL | Status: AC
  Administered 2013-05-07: 5 mg via ORAL
  Filled 2013-05-07: qty 1

## 2013-05-07 NOTE — Progress Notes (Addendum)
Accepted the patient to [facility]  Patient states that she feels well. She has pain in her left lateral thigh area. She denies a shortness of breath that has been on 3 L of nasal cannula and normally she is on 2 L.    GEN: Obese Caucasian female, no acute distress HEENT: Nasal cannula in place CV:  Her heart rate is regular rhythm  PULM: Clear to auscultation anteriorly. No increased WOB MSK:  She has pain in the left lateral hip and there appears to be a minimal amount of bruising there. She has 2+ pedal pulse on the right and unable to palpate a pedal pulse on the left. She is able to wiggle her toes on both legs. Her left leg is externally rotated and shortened.  Assessment and plan: Left hip fracture: I notified Dr.Norris the patient had arrived to the hospital.   -  Nurse to perform Doppler of left pedal pulse  Atrial fibrillation: Currently regular rate and rhythm and previously normal sinus rhythm -  Telemetry -  Continue diltiazem -  INR is still elevated this afternoon, so per orthopedics request I have given another dose of vitamin K -  Repeat INR in the morning and start heparin drip when INR is close to 2  Diastolic heart failure, acute on chronic exacerbation of diastolic heart failure. Evidence of cardiomegaly and vascular congestion on her chest x-ray and her proBNP is quite elevated compared to her baseline -  Give one dose of Lasix 40 mg IV -  Continue home Lasix  Diabetes mellitus,  insulin. Will followup and hemoglobin A1c  Obstructive sleep apnea, stable. Continue nightly CPAP

## 2013-05-07 NOTE — Consult Note (Signed)
Reason for Consult:left proximal femur fracture Referring Physician: [facility]  [name] is an 67 y.o. female.  HPI: 67 yo female who has had several recent falls who lost her balance and fell injuring her left hip and leg. Patient complained of immediate severe pain and was unable to ambulate after the fall.  Presented to [facility] where she was seen by Dr Aline Brochure (ortho).  Dr Aline Brochure did not feel comfortable taking care of her there due to her other medical conditions and high risk nature of her surgery.  Requested transfer to [facility] for further management and care.  I have been asked to consult on her orthopedic injuries.  Past Medical History  Diagnosis Date  . Anemia   . Arthritis   . Asthma   . Cataract     had surgery  . CHF (congestive heart failure)   . Diabetes mellitus   . Hyperlipidemia   . Hypertension   . CAD (coronary artery disease)     LAD 60% proximal stenosis mid and distal 60% stenosis 2009.  . Stroke     2 mini  . Oxygen dependent     2 liter per nasal cannula  . Gait instability     uses cane  . GERD (gastroesophageal reflux disease)   . Obesity   . Sleep apnea, obstructive 2008  . Atrial fibrillation   . COPD (chronic obstructive pulmonary disease)     Past Surgical History  Procedure Laterality Date  . Cataract extraction w/ intraocular lens  implant, bilateral    . Appendectomy    . Cholecystectomy    . Carpal tunnel release      right hand  . Cervical spine surgery      fusion  . Cesarean section    . Umbilical hernia repair    . Knee arthroscopy      twice, left  . Colonoscopy  11/22/2002    normal - Dr.Rrourk  . Upper gastrointestinal endoscopy  11/22/2002    Normal - Dr. Gala Romney  . Colonoscopy  11/26/2005    incomplete with negative BE (Dr. Rowe Pavy, [facility])    Family History  Problem Relation Age of Onset  . Colon cancer Mother   . Heart attack Mother   . Alzheimer's disease Mother     father  . Diabetes Mother   . Heart  disease Mother   . Diabetes    . Heart attack    . Breast cancer Sister     Social History:  reports that she has never smoked. She has never used smokeless tobacco. She reports that she does not drink alcohol or use illicit drugs.  Allergies:  Allergies  Allergen Reactions  . Codeine     Medications: I have reviewed the patient's current medications.  Results for orders placed during the hospital encounter of 05/06/13 (from the past 48 hour(s))  CBC WITH DIFFERENTIAL     Status: Abnormal   Collection Time    05/06/13  9:24 PM      Result Value Range   WBC 15.1 (*) 4.0 - 10.5 K/uL   RBC 3.21 (*) 3.87 - 5.11 MIL/uL   Hemoglobin 8.4 (*) 12.0 - 15.0 g/dL   HCT 27.6 (*) 36.0 - 46.0 %   MCV 86.0  78.0 - 100.0 fL   MCH 26.2  26.0 - 34.0 pg   MCHC 30.4  30.0 - 36.0 g/dL   RDW 16.2 (*) 11.5 - 15.5 %   Platelets 154  150 -  400 K/uL   Neutrophils Relative % 91 (*) 43 - 77 %   Neutro Abs 13.7 (*) 1.7 - 7.7 K/uL   Lymphocytes Relative 4 (*) 12 - 46 %   Lymphs Abs 0.7  0.7 - 4.0 K/uL   Monocytes Relative 4  3 - 12 %   Monocytes Absolute 0.6  0.1 - 1.0 K/uL   Eosinophils Relative 0  0 - 5 %   Eosinophils Absolute 0.0  0.0 - 0.7 K/uL   Basophils Relative 0  0 - 1 %   Basophils Absolute 0.0  0.0 - 0.1 K/uL  COMPREHENSIVE METABOLIC PANEL     Status: Abnormal   Collection Time    05/06/13  9:24 PM      Result Value Range   Sodium 140  135 - 145 mEq/L   Potassium 4.2  3.5 - 5.1 mEq/L   Chloride 105  96 - 112 mEq/L   CO2 27  19 - 32 mEq/L   Glucose, Bld 153 (*) 70 - 99 mg/dL   BUN 20  6 - 23 mg/dL   Creatinine, Ser 0.88  0.50 - 1.10 mg/dL   Calcium 8.8  8.4 - 10.5 mg/dL   Total Protein 6.7  6.0 - 8.3 g/dL   Albumin 3.3 (*) 3.5 - 5.2 g/dL   AST 14  0 - 37 U/L   ALT 10  0 - 35 U/L   Alkaline Phosphatase 144 (*) 39 - 117 U/L   Total Bilirubin 0.7  0.3 - 1.2 mg/dL   GFR calc non Af Amer 66 (*) >90 mL/min   GFR calc Af Amer 76 (*) >90 mL/min   Comment:            The eGFR has been  calculated     using the CKD EPI equation.     This calculation has not been     validated in all clinical     situations.     eGFR's persistently     <90 mL/min signify     possible Chronic Kidney Disease.  PROTIME-INR     Status: Abnormal   Collection Time    05/06/13  9:24 PM      Result Value Range   Prothrombin Time 33.2 (*) 11.6 - 15.2 seconds   INR 3.51 (*) 0.00 - 1.49  APTT     Status: Abnormal   Collection Time    05/06/13  9:24 PM      Result Value Range   aPTT 39 (*) 24 - 37 seconds   Comment:            IF BASELINE aPTT IS ELEVATED,     SUGGEST PATIENT RISK ASSESSMENT     BE USED TO DETERMINE APPROPRIATE     ANTICOAGULANT THERAPY.  URINALYSIS, ROUTINE W REFLEX MICROSCOPIC     Status: Abnormal   Collection Time    05/06/13 10:13 PM      Result Value Range   Color, Urine YELLOW  YELLOW   APPearance CLEAR  CLEAR   Specific Gravity, Urine >1.030 (*) 1.005 - 1.030   pH 5.5  5.0 - 8.0   Glucose, UA NEGATIVE  NEGATIVE mg/dL   Hgb urine dipstick TRACE (*) NEGATIVE   Bilirubin Urine NEGATIVE  NEGATIVE   Ketones, ur NEGATIVE  NEGATIVE mg/dL   Protein, ur TRACE (*) NEGATIVE mg/dL   Urobilinogen, UA 0.2  0.0 - 1.0 mg/dL   Nitrite NEGATIVE  NEGATIVE   Leukocytes, UA NEGATIVE  NEGATIVE  URINE MICROSCOPIC-ADD ON     Status: Abnormal   Collection Time    05/06/13 10:13 PM      Result Value Range   Squamous Epithelial / LPF FEW (*) RARE   WBC, UA 3-6  <3 WBC/hpf   RBC / HPF 0-2  <3 RBC/hpf   Bacteria, UA FEW (*) RARE   Casts HYALINE CASTS (*) NEGATIVE  GLUCOSE, CAPILLARY     Status: Abnormal   Collection Time    05/07/13 12:09 AM      Result Value Range   Glucose-Capillary 174 (*) 70 - 99 mg/dL   Comment 1 Notify RN    GLUCOSE, CAPILLARY     Status: Abnormal   Collection Time    05/07/13  3:51 AM      Result Value Range   Glucose-Capillary 165 (*) 70 - 99 mg/dL   Comment 1 Notify RN    CBC     Status: Abnormal   Collection Time    05/07/13  6:35 AM       Result Value Range   WBC 10.6 (*) 4.0 - 10.5 K/uL   RBC 3.01 (*) 3.87 - 5.11 MIL/uL   Hemoglobin 8.0 (*) 12.0 - 15.0 g/dL   HCT 26.2 (*) 36.0 - 46.0 %   MCV 87.0  78.0 - 100.0 fL   MCH 26.6  26.0 - 34.0 pg   MCHC 30.5  30.0 - 36.0 g/dL   RDW 16.3 (*) 11.5 - 15.5 %   Platelets 140 (*) 150 - 400 K/uL  BASIC METABOLIC PANEL     Status: Abnormal   Collection Time    05/07/13  6:35 AM      Result Value Range   Sodium 140  135 - 145 mEq/L   Potassium 5.3 (*) 3.5 - 5.1 mEq/L   Comment: DELTA CHECK NOTED   Chloride 107  96 - 112 mEq/L   CO2 26  19 - 32 mEq/L   Glucose, Bld 166 (*) 70 - 99 mg/dL   BUN 20  6 - 23 mg/dL   Creatinine, Ser 0.83  0.50 - 1.10 mg/dL   Calcium 8.4  8.4 - 10.5 mg/dL   GFR calc non Af Amer 70 (*) >90 mL/min   GFR calc Af Amer 82 (*) >90 mL/min   Comment:            The eGFR has been calculated     using the CKD EPI equation.     This calculation has not been     validated in all clinical     situations.     eGFR's persistently     <90 mL/min signify     possible Chronic Kidney Disease.  GLUCOSE, CAPILLARY     Status: Abnormal   Collection Time    05/07/13  7:35 AM      Result Value Range   Glucose-Capillary 128 (*) 70 - 99 mg/dL  GLUCOSE, CAPILLARY     Status: Abnormal   Collection Time    05/07/13 11:34 AM      Result Value Range   Glucose-Capillary 147 (*) 70 - 99 mg/dL  PRO B NATRIURETIC PEPTIDE     Status: Abnormal   Collection Time    05/07/13  1:02 PM      Result Value Range   Pro B Natriuretic peptide (BNP) 3126.0 (*) 0 - 125 pg/mL  PROTIME-INR     Status: Abnormal   Collection Time    05/07/13  1:02 PM      Result Value Range   Prothrombin Time 30.5 (*) 11.6 - 15.2 seconds   INR 3.13 (*) 0.00 - 1.49  GLUCOSE, CAPILLARY     Status: Abnormal   Collection Time    05/07/13  4:37 PM      Result Value Range   Glucose-Capillary 159 (*) 70 - 99 mg/dL    Dg Chest 1 View  05/06/2013   *RADIOLOGY REPORT*  Clinical Data: Status post fall;  concern for chest injury.  CHEST - 1 VIEW  Comparison: Chest radiograph performed 03/09/2013  Findings: The lungs are well-aerated.  Vascular congestion is noted, with chronically increased interstitial markings.  Some of this may be transient in nature.  No pleural effusion or pneumothorax is seen.  The cardiomediastinal silhouette is enlarged.  No acute osseous abnormalities are seen.  Cervical spinal fusion hardware is partially imaged.  IMPRESSION:  1.  No displaced rib fractures seen. 2.  Vascular congestion and cardiomegaly; the degree of vascular congestion may be transient in nature, given recent fall.   Original Report Authenticated By: Santa Lighter, M.D.   Dg Elbow 2 Views Left  05/06/2013   *RADIOLOGY REPORT*  Clinical Data: Left elbow bruising, status post fall.  LEFT ELBOW - 2 VIEW  Comparison: None.  Findings: There is no evidence of fracture or dislocation.  The visualized joint spaces are preserved.  No significant joint effusion is identified.  Known soft tissue bruising is not well characterized on radiograph.  IMPRESSION: No evidence of fracture or dislocation.   Original Report Authenticated By: Santa Lighter, M.D.   Dg Hip Complete Left  05/06/2013   *RADIOLOGY REPORT*  Clinical Data: Left hip pain status post fall.  LEFT HIP - COMPLETE 2+ VIEW  Comparison: CT of the abdomen and pelvis performed 03/17/2013  Findings: There is a spiral fracture extending across the left proximal femoral diaphysis, with an associated displaced lesser trochanteric fragment.  The left femoral neck remains intact.  The femoral heads remains seated within their respective acetabula. The right hip joint is unremarkable in appearance.  The degree of displacement of the fracture varies depending on positioning.  The sacroiliac joints are unremarkable in appearance.  The visualized bowel gas pattern is grossly unremarkable.  No significant soft tissue abnormalities are characterized on radiograph.  IMPRESSION:  Variably displaced spiral fracture extending across the left proximal femoral diaphysis, with an associated displaced lesser trochanteric fragment.  The left femoral neck remains intact.   Original Report Authenticated By: Santa Lighter, M.D.    ROS Blood pressure 144/34, pulse 72, temperature 98.9 F (37.2 C), temperature source Oral, resp. rate 20, height 5\' 6"  (1.676 m), weight 144.4 kg (318 lb 5.5 oz), SpO2 95.00%. Physical Exam  Healthy appearing morbidly obese female who is laying in bed. Her right UE is fully mobile, able to reach over her head. Left shoulder and clavicle area is tender and she hurts when she tries to elevate her arm. Left elbow has multiple bruises but no deformity and relatively pain free ROM, bilateral wrists nontender. Distally NVI bilateral UEs. Abdomen soft Right LE with normal pain free ROM, NVI L LE, externally rotated, unable to move due to pain. NVI    Assessment/Plan: Left subtrochanteric femur fracture in obese patient on chronic coumadin.  Patient is at high perioperative risk for surgery.  I have discussed this with the patient and her daughter who understand the need for surgical stabilization of the fracture. Will need INR to  be below 1.7 before we can proceed with surgery. Request XRAYs of her left clavicle and shoulder as well. Possible surgery tomorrow.   Ehan Freas,STEVEN R 05/07/2013, 4:58 PM

## 2013-05-07 NOTE — Progress Notes (Addendum)
Patient left dept 300 in stable condition via stretcher with [facility].  0.5 mg IV PRN morphine given for pain prior to leaving.  Report called and given to Evelena Peat, RN on [phone]

## 2013-05-07 NOTE — Progress Notes (Signed)
Pt admitted from [facility] with L hip fx, pt given education on incentive spirometer, pt demonstrated use and has performed during shift, pt given PRN morphine for pain, will continue to monitor

## 2013-05-07 NOTE — Progress Notes (Signed)
Orthopedic Tech Progress Note Patient Details:  [name] Apr 10, 1946 [id number]  Patient ID: [name], female   DOB: Jun 23, 1946, 67 y.o.   MRN: [id number] Trapeze bar patient helper  [name] 05/07/2013, 7:57 PM

## 2013-05-07 NOTE — Progress Notes (Signed)
ANTICOAGULATION CONSULT NOTE - Initial Consult  Pharmacy Consult for Heparin  Indication: atrial fibrillation  Allergies  Allergen Reactions  . Codeine     Patient Measurements: Height: 5\' 6"  (167.6 cm) Weight: 318 lb 5.5 oz (144.4 kg) IBW/kg (Calculated) : 59.3 Heparin Dosing Weight: 95 kg  Vital Signs: Temp: 98.9 F (37.2 C) (06/07 1554) Temp src: Oral (06/07 1554) BP: 144/34 mmHg (06/07 1554) Pulse Rate: 72 (06/07 1554)  Labs:  Recent Labs  05/05/13 1452 05/06/13 2124 05/07/13 0635 05/07/13 1302  HGB  --  8.4* 8.0*  --   HCT  --  27.6* 26.2*  --   PLT  --  154 140*  --   APTT  --  39*  --   --   LABPROT  --  33.2*  --  30.5*  INR 4.4 3.51*  --  3.13*  CREATININE  --  0.88 0.83  --     Estimated Creatinine Clearance: 94.2 ml/min (by C-G formula based on Cr of 0.83).   Medical History: Past Medical History  Diagnosis Date  . Anemia   . Arthritis   . Asthma   . Cataract     had surgery  . CHF (congestive heart failure)   . Diabetes mellitus   . Hyperlipidemia   . Hypertension   . CAD (coronary artery disease)     LAD 60% proximal stenosis mid and distal 60% stenosis 2009.  . Stroke     2 mini  . Oxygen dependent     2 liter per nasal cannula  . Gait instability     uses cane  . GERD (gastroesophageal reflux disease)   . Obesity   . Sleep apnea, obstructive 2008  . Atrial fibrillation   . COPD (chronic obstructive pulmonary disease)     Medications:  Prescriptions prior to admission  Medication Sig Dispense Refill  . alendronate (FOSAMAX) 70 MG tablet Take 1 tablet (70 mg total) by mouth every 7 (seven) days.  4 tablet  2  . diltiazem (CARDIZEM CD) 180 MG 24 hr capsule Take 180 mg by mouth daily.      . fish oil-omega-3 fatty acids 1000 MG capsule Take 2 g by mouth daily.        . furosemide (LASIX) 20 MG tablet Take 1 tablet by mouth 3 (three) times daily.       Marland Kitchen ibuprofen (ADVIL,MOTRIN) 200 MG tablet Take 200 mg by mouth at bedtime.       Marland Kitchen LEVEMIR FLEXPEN 100 UNIT/ML injection Inject 43 Units into the skin Twice daily. After breadfast and at 9pm      . lisinopril (PRINIVIL,ZESTRIL) 20 MG tablet Take 20 mg by mouth daily.       . metFORMIN (GLUCOPHAGE) 500 MG tablet Take 500 mg by mouth 2 (two) times daily with a meal.        . potassium chloride SA (K-DUR,KLOR-CON) 20 MEQ tablet Take 20 mEq by mouth.      . rosuvastatin (CRESTOR) 10 MG tablet Take 10 mg by mouth at bedtime.      Marland Kitchen warfarin (COUMADIN) 5 MG tablet Take 5 mg by mouth See admin instructions. Take every evening except Monday.  No dose on Monday.      Marland Kitchen oxyCODONE-acetaminophen (PERCOCET) 10-325 MG per tablet Take 1 tablet by mouth every 8 (eight) hours as needed for pain.  30 tablet  0   Scheduled:  . antiseptic oral rinse  15 mL Mouth Rinse BID  .  atorvastatin  20 mg Oral q1800  . diltiazem  180 mg Oral Daily  . furosemide  40 mg Intravenous Once  . furosemide  20 mg Oral TID  . insulin aspart  0-20 Units Subcutaneous Q4H  . phytonadione  5 mg Oral Once    Assessment: 67 y.o female , morbidly obese and on chronic coumadin PTA for h/o of atrial fibrillation. Left subtrochanteric femur fracture.  Couamdin is on hold.  Supratherapeutic INR of 3.51 on admit. Vitamin K 5 mg po given last night.  Today INR = 3.13, remains supratherapeutic and MD has ordered vitamin K 5mg  po tonight.  Ortho plans for surgery when INR <1.7.  Possible surgery tomorrow.   Goal of Therapy:  Heparin level 0.3-0.7 units/ml Monitor platelets by anticoagulation protocol: Yes   Plan:  No IV heparin needed at this time as INR is 3.1, above the therapeutic goal of 2 -3.  Will f/u INR in AM.  Plan to start IV heparin drip when INR <2 but will confirm if plan to take to surgery tomorrow before starting IV heparin.   Nicole Cella, RPh Clinical Pharmacist Pager: [phone]  05/07/2013,5:27 PM

## 2013-05-07 NOTE — Progress Notes (Signed)
[facility] PROGRESS NOTE  [name] [id number] DOB: September 03, 1946 DOA: 05/06/2013 PCP: Redge Gainer, MD  Assessment/Plan: Active Problems:   FIBRILLATION, ATRIAL: Currently in normal sinus rhythm. Rate controlled. Off of Coumadin and waiting for INR to come down. INR slightly supratherapeutic on admission and she was given vitamin K times one dose. After she was transferred to count, will consult pharmacy for heparin until surgery.  Diastolic heart failure: Patient may have some volume overload which would explain her slightly elevated INR. Awaiting BNP. She's currently breathing comfortably and vital signs are stable.    Hip fracture: Orthopedist at [facility] felt patient would need higher level of care and would be high risk for surgery.. I have spoken with Dr. Veverly Fells orthopedic surgery on call. He requested transfer to [facility] Given heart issues.  Patient did undergo stress Myoview last month by [place] which was normal. Dr. Veverly Fells is to be informed upon patient's arrival    Diabetes mellitus: Stable. CBG range from 120s to 160s. Currently on carb modified diet. A1c ordered and is pending  History of obstructive sleep apnea: Use nightly CPAP  Morbid obesity   Code Status: Full code  Family Communication: Plan discussed with patient and her aunt at the bedside  Disposition Plan: Plan to transfer patient to hospitalist service at [facility].   Consultants:  Norris-orthopedic surgery  [name]-orthopedic surgery at [facility]  Procedures:  None  Antibiotics:  None  HPI/Subjective: Patient feeling okay. Some mild pain in the left side of her head. No denies any shortness of breath or chest pain or palpitations.  Objective: Filed Vitals:   05/07/13 0623 05/07/13 0800 05/07/13 1010 05/07/13 1133  BP: 113/78  140/52   Pulse: 65  71   Temp: 97.3 F (36.3 C)  97.9 F (36.6 C)   TempSrc:      Resp: 18 18 18 18   Height:      Weight:      SpO2: 99% 98% 96% 98%     Intake/Output Summary (Last 24 hours) at 05/07/13 1212 Last data filed at 05/07/13 1020  Gross per 24 hour  Intake    970 ml  Output    450 ml  Net    520 ml   Filed Weights   05/06/13 2200 05/06/13 2331 05/06/13 2333  Weight: 144.4 kg (318 lb 5.5 oz) 144.4 kg (318 lb 5.5 oz) 144.4 kg (318 lb 5.5 oz)    Exam:   General:  Alert and oriented x3, minimal distress secondary to hip pain  Cardiovascular: Currently regular rate and rhythm, S1-S2 rate controlled  Respiratory: Decreased breath sounds throughout secondary to body habitus come otherwise clear  Abdomen: Soft, nontender, morbidly obese, hypoactive bowel sounds  Musculoskeletal: No clubbing or cyanosis, trace pitting edema from the knees down bilaterally   Data Reviewed: Basic Metabolic Panel:  Recent Labs Lab 05/06/13 2124 05/07/13 0635  NA 140 140  K 4.2 5.3*  CL 105 107  CO2 27 26  GLUCOSE 153* 166*  BUN 20 20  CREATININE 0.88 0.83  CALCIUM 8.8 8.4   Liver Function Tests:  Recent Labs Lab 05/06/13 2124  AST 14  ALT 10  ALKPHOS 144*  BILITOT 0.7  PROT 6.7  ALBUMIN 3.3*   CBC:  Recent Labs Lab 05/06/13 2124 05/07/13 0635  WBC 15.1* 10.6*  NEUTROABS 13.7*  --   HGB 8.4* 8.0*  HCT 27.6* 26.2*  MCV 86.0 87.0  PLT 154 140*    CBG:  Recent Labs  Lab 05/07/13 0009 05/07/13 0351 05/07/13 0735 05/07/13 1134  GLUCAP 174* 165* 128* 147*      Studies: Dg Chest 1 View  05/06/2013    IMPRESSION:  1.  No displaced rib fractures seen. 2.  Vascular congestion and cardiomegaly; the degree of vascular congestion may be transient in nature, given recent fall.   Original Report Authenticated By: Santa Lighter, M.D.   Dg Elbow 2 Views Left  05/06/2013   IMPRESSION: No evidence of fracture or dislocation.   Original Report Authenticated By: Santa Lighter, M.D.   Dg Hip Complete Left  05/06/2013    IMPRESSION: Variably displaced spiral fracture extending across the left proximal femoral  diaphysis, with an associated displaced lesser trochanteric fragment.  The left femoral neck remains intact.   Original Report Authenticated By: Santa Lighter, M.D.    Scheduled Meds: . antiseptic oral rinse  15 mL Mouth Rinse BID  . atorvastatin  20 mg Oral q1800  . diltiazem  180 mg Oral Daily  . furosemide  20 mg Oral TID  . insulin aspart  0-20 Units Subcutaneous Q4H   Continuous Infusions:   Active Problems:   FIBRILLATION, ATRIAL   CONGESTIVE HEART FAILURE (CHF)   Hip fracture   Diabetes mellitus    Time spent: 25 minutes    [facility] Pager [phone]. If 7PM-7AM, please contact night-coverage at [email or web address], password [facility] 05/07/2013, 12:12 PM  LOS: 1 day
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
## 2013-05-08 ENCOUNTER — Encounter (HOSPITAL_COMMUNITY): Payer: Self-pay | Admitting: Anesthesiology

## 2013-05-08 ENCOUNTER — Inpatient Hospital Stay (HOSPITAL_COMMUNITY): Payer: Medicare PPO

## 2013-05-08 ENCOUNTER — Encounter (HOSPITAL_COMMUNITY)
Admission: EM | Disposition: A | Discharge status: Skilled Nursing Facility | Payer: Self-pay | Source: Home / Self Care | Attending: Internal Medicine

## 2013-05-08 ENCOUNTER — Inpatient Hospital Stay (HOSPITAL_COMMUNITY): Payer: Medicare PPO | Admitting: Anesthesiology

## 2013-05-08 HISTORY — PX: FEMUR IM NAIL: SHX1597

## 2013-05-08 LAB — GLUCOSE, CAPILLARY
Glucose-Capillary: 122 mg/dL — ABNORMAL HIGH (ref 70–99)
Glucose-Capillary: 159 mg/dL — ABNORMAL HIGH (ref 70–99)
Glucose-Capillary: 188 mg/dL — ABNORMAL HIGH (ref 70–99)
Glucose-Capillary: 195 mg/dL — ABNORMAL HIGH (ref 70–99)

## 2013-05-08 LAB — CBC
HCT: 33.2 % — ABNORMAL LOW (ref 36.0–46.0)
MCHC: 33.1 g/dL (ref 30.0–36.0)
MCV: 84.7 fL (ref 78.0–100.0)
RDW: 15.6 % — ABNORMAL HIGH (ref 11.5–15.5)

## 2013-05-08 LAB — CREATININE, SERUM: GFR calc Af Amer: 71 mL/min — ABNORMAL LOW (ref >90)

## 2013-05-08 LAB — POCT I-STAT 4, (NA,K, GLUC, HGB,HCT): Sodium: 141 mEq/L (ref 135–145)

## 2013-05-08 LAB — URINE CULTURE
Colony Count: NO GROWTH
Culture: NO GROWTH

## 2013-05-08 LAB — BASIC METABOLIC PANEL
BUN: 25 mg/dL — ABNORMAL HIGH (ref 6–23)
CO2: 29 mEq/L (ref 19–32)
Chloride: 107 mEq/L (ref 96–112)
GFR calc Af Amer: 52 mL/min — ABNORMAL LOW (ref >90)
Potassium: 4.5 mEq/L (ref 3.5–5.1)

## 2013-05-08 LAB — PROTIME-INR: Prothrombin Time: 20.5 seconds — ABNORMAL HIGH (ref 11.6–15.2)

## 2013-05-08 LAB — PREPARE RBC (CROSSMATCH)

## 2013-05-08 SURGERY — INSERTION, INTRAMEDULLARY ROD, FEMUR
Anesthesia: General | Site: Leg Upper | Laterality: Left | Wound class: Clean

## 2013-05-08 MED ORDER — SODIUM CHLORIDE 0.9 % IV SOLN
INTRAVENOUS | Status: DC | PRN
  Administered 2013-05-08 (×3): via INTRAVENOUS

## 2013-05-08 MED ORDER — PROPOFOL 10 MG/ML IV BOLUS
INTRAVENOUS | Status: DC | PRN
  Administered 2013-05-08: 200 mg via INTRAVENOUS

## 2013-05-08 MED ORDER — DEXTROSE 5 % IV SOLN
3.0000 g | Freq: Three times a day (TID) | INTRAVENOUS | Status: DC
  Filled 2013-05-08 (×3): qty 3000

## 2013-05-08 MED ORDER — SUCCINYLCHOLINE CHLORIDE 20 MG/ML IJ SOLN
INTRAMUSCULAR | Status: DC | PRN
  Administered 2013-05-08: 100 mg via INTRAVENOUS

## 2013-05-08 MED ORDER — WARFARIN SODIUM 5 MG PO TABS
5.0000 mg | ORAL_TABLET | Freq: Once | ORAL | Status: AC
  Administered 2013-05-08: 5 mg via ORAL
  Filled 2013-05-08: qty 1

## 2013-05-08 MED ORDER — PHENYLEPHRINE HCL 10 MG/ML IJ SOLN
INTRAMUSCULAR | Status: DC | PRN
  Administered 2013-05-08: 120 ug via INTRAVENOUS

## 2013-05-08 MED ORDER — CEFAZOLIN SODIUM-DEXTROSE 2-3 GM-% IV SOLR
2.0000 g | Freq: Four times a day (QID) | INTRAVENOUS | Status: AC
  Administered 2013-05-08 – 2013-05-09 (×2): 2 g via INTRAVENOUS
  Filled 2013-05-08 (×2): qty 50

## 2013-05-08 MED ORDER — ONDANSETRON HCL 4 MG/2ML IJ SOLN: 4.0000 mg | Freq: Four times a day (QID) | INTRAMUSCULAR | Status: DC | PRN

## 2013-05-08 MED ORDER — ROCURONIUM BROMIDE 100 MG/10ML IV SOLN
INTRAVENOUS | Status: DC | PRN
  Administered 2013-05-08: 20 mg via INTRAVENOUS
  Administered 2013-05-08: 30 mg via INTRAVENOUS

## 2013-05-08 MED ORDER — ACETAMINOPHEN 650 MG RE SUPP: 650.0000 mg | Freq: Four times a day (QID) | RECTAL | Status: DC | PRN

## 2013-05-08 MED ORDER — OXYCODONE HCL 5 MG PO TABS
5.0000 mg | ORAL_TABLET | ORAL | Status: DC | PRN
Start: 2013-05-08 — End: 2013-05-09
  Administered 2013-05-09: 10 mg via ORAL
  Filled 2013-05-08: qty 2

## 2013-05-08 MED ORDER — CHLORHEXIDINE GLUCONATE CLOTH 2 % EX PADS
6.0000 | MEDICATED_PAD | Freq: Every day | CUTANEOUS | Status: AC
  Administered 2013-05-08 – 2013-05-12 (×5): 6 via TOPICAL

## 2013-05-08 MED ORDER — ONDANSETRON HCL 4 MG PO TABS: 4.0000 mg | ORAL_TABLET | Freq: Four times a day (QID) | ORAL | Status: DC | PRN

## 2013-05-08 MED ORDER — MORPHINE SULFATE 4 MG/ML IJ SOLN
4.0000 mg | INTRAMUSCULAR | Status: DC | PRN
  Administered 2013-05-09: 4 mg via INTRAVENOUS
  Filled 2013-05-08: qty 1

## 2013-05-08 MED ORDER — CEFAZOLIN SODIUM 1-5 GM-% IV SOLN
INTRAVENOUS | Status: AC
  Filled 2013-05-08: qty 150

## 2013-05-08 MED ORDER — METHOCARBAMOL 500 MG PO TABS
500.0000 mg | ORAL_TABLET | Freq: Four times a day (QID) | ORAL | Status: DC | PRN
  Administered 2013-05-09: 500 mg via ORAL
  Filled 2013-05-08: qty 1

## 2013-05-08 MED ORDER — FENTANYL CITRATE 0.05 MG/ML IJ SOLN
INTRAMUSCULAR | Status: DC | PRN
  Administered 2013-05-08 (×2): 50 ug via INTRAVENOUS

## 2013-05-08 MED ORDER — OXYCODONE HCL 5 MG/5ML PO SOLN: 5.0000 mg | Freq: Once | ORAL | Status: DC | PRN

## 2013-05-08 MED ORDER — IPRATROPIUM BROMIDE 0.02 % IN SOLN: 0.5000 mg | RESPIRATORY_TRACT | Status: DC | PRN

## 2013-05-08 MED ORDER — 0.9 % SODIUM CHLORIDE (POUR BTL) OPTIME
TOPICAL | Status: DC | PRN
  Administered 2013-05-08: 1000 mL

## 2013-05-08 MED ORDER — MENTHOL 3 MG MT LOZG
1.0000 | LOZENGE | OROMUCOSAL | Status: DC | PRN
  Filled 2013-05-08: qty 9

## 2013-05-08 MED ORDER — METHOCARBAMOL 100 MG/ML IJ SOLN
500.0000 mg | Freq: Four times a day (QID) | INTRAVENOUS | Status: DC | PRN
  Filled 2013-05-08: qty 5

## 2013-05-08 MED ORDER — ACETAMINOPHEN 325 MG PO TABS: 650.0000 mg | ORAL_TABLET | Freq: Four times a day (QID) | ORAL | Status: DC | PRN

## 2013-05-08 MED ORDER — OXYCODONE HCL 5 MG PO TABS: 5.0000 mg | ORAL_TABLET | Freq: Once | ORAL | Status: DC | PRN

## 2013-05-08 MED ORDER — MIDAZOLAM HCL 5 MG/5ML IJ SOLN
INTRAMUSCULAR | Status: DC | PRN
  Administered 2013-05-08 (×2): 1 mg via INTRAVENOUS

## 2013-05-08 MED ORDER — METOCLOPRAMIDE HCL 5 MG PO TABS
5.0000 mg | ORAL_TABLET | Freq: Three times a day (TID) | ORAL | Status: DC | PRN
  Filled 2013-05-08: qty 2

## 2013-05-08 MED ORDER — ARTIFICIAL TEARS OP OINT
TOPICAL_OINTMENT | OPHTHALMIC | Status: DC | PRN
  Administered 2013-05-08: 1 via OPHTHALMIC

## 2013-05-08 MED ORDER — LACTATED RINGERS IV SOLN
INTRAVENOUS | Status: DC | PRN
  Administered 2013-05-08: 13:00:00 via INTRAVENOUS

## 2013-05-08 MED ORDER — METOCLOPRAMIDE HCL 5 MG/ML IJ SOLN
5.0000 mg | Freq: Three times a day (TID) | INTRAMUSCULAR | Status: DC | PRN
  Filled 2013-05-08: qty 2

## 2013-05-08 MED ORDER — HYDROMORPHONE HCL PF 1 MG/ML IJ SOLN: 0.2500 mg | INTRAMUSCULAR | Status: DC | PRN

## 2013-05-08 MED ORDER — MUPIROCIN 2 % EX OINT
1.0000 "application " | TOPICAL_OINTMENT | Freq: Two times a day (BID) | CUTANEOUS | Status: AC
  Administered 2013-05-08 – 2013-05-12 (×10): 1 via NASAL
  Filled 2013-05-08: qty 22

## 2013-05-08 MED ORDER — LIDOCAINE HCL (CARDIAC) 20 MG/ML IV SOLN
INTRAVENOUS | Status: DC | PRN
  Administered 2013-05-08: 100 mg via INTRAVENOUS

## 2013-05-08 MED ORDER — HYDROMORPHONE HCL PF 1 MG/ML IJ SOLN
INTRAMUSCULAR | Status: AC
  Administered 2013-05-08: 0.5 mg via INTRAVENOUS
  Filled 2013-05-08: qty 1

## 2013-05-08 MED ORDER — ALBUTEROL SULFATE (5 MG/ML) 0.5% IN NEBU: 2.5000 mg | INHALATION_SOLUTION | RESPIRATORY_TRACT | Status: DC | PRN

## 2013-05-08 MED ORDER — ONDANSETRON HCL 4 MG/2ML IJ SOLN
INTRAMUSCULAR | Status: DC | PRN
  Administered 2013-05-08: 4 mg via INTRAVENOUS

## 2013-05-08 MED ORDER — ENOXAPARIN SODIUM 40 MG/0.4ML ~~LOC~~ SOLN
40.0000 mg | SUBCUTANEOUS | Status: DC
  Administered 2013-05-09 – 2013-05-10 (×2): 40 mg via SUBCUTANEOUS
  Filled 2013-05-08 (×2): qty 0.4

## 2013-05-08 MED ORDER — HEPARIN (PORCINE) IN NACL 100-0.45 UNIT/ML-% IJ SOLN
1600.0000 [IU]/h | INTRAMUSCULAR | Status: DC
  Administered 2013-05-08: 1600 [IU]/h via INTRAVENOUS
  Filled 2013-05-08 (×2): qty 250

## 2013-05-08 MED ORDER — METOCLOPRAMIDE HCL 5 MG/ML IJ SOLN
INTRAMUSCULAR | Status: DC | PRN
  Administered 2013-05-08: 10 mg via INTRAVENOUS

## 2013-05-08 MED ORDER — PHENOL 1.4 % MT LIQD
1.0000 | OROMUCOSAL | Status: DC | PRN
  Filled 2013-05-08: qty 177

## 2013-05-08 MED ORDER — DEXTROSE 5 % IV SOLN
3.0000 g | INTRAVENOUS | Status: DC | PRN
  Administered 2013-05-08: 3 g via INTRAVENOUS

## 2013-05-08 MED ORDER — ENOXAPARIN SODIUM 40 MG/0.4ML ~~LOC~~ SOLN
40.0000 mg | SUBCUTANEOUS | Status: DC
  Filled 2013-05-08: qty 0.4

## 2013-05-08 MED ORDER — POTASSIUM CHLORIDE IN NACL 20-0.9 MEQ/L-% IV SOLN
INTRAVENOUS | Status: DC
  Administered 2013-05-08: 20:00:00 via INTRAVENOUS
  Filled 2013-05-08 (×2): qty 1000

## 2013-05-08 MED ORDER — METOCLOPRAMIDE HCL 5 MG/ML IJ SOLN: 10.0000 mg | Freq: Once | INTRAMUSCULAR | Status: DC | PRN

## 2013-05-08 MED ORDER — DEXTROSE 5 % IV SOLN: 3.0000 g | INTRAVENOUS | Status: DC | PRN

## 2013-05-08 MED ORDER — WARFARIN - PHARMACIST DOSING INPATIENT: Freq: Every day | Status: DC

## 2013-05-08 SURGICAL SUPPLY — 66 items
BANDAGE ELASTIC 4 VELCRO ST LF (GAUZE/BANDAGES/DRESSINGS) ×2 IMPLANT
BANDAGE ELASTIC 6 VELCRO ST LF (GAUZE/BANDAGES/DRESSINGS) ×2 IMPLANT
BANDAGE GAUZE ELAST BULKY 4 IN (GAUZE/BANDAGES/DRESSINGS) ×2 IMPLANT
BIT DRILL 4.3MMS DISTAL GRDTED (BIT) ×1 IMPLANT
BNDG COHESIVE 4X5 TAN STRL (GAUZE/BANDAGES/DRESSINGS) ×2 IMPLANT
CABLE (Orthopedic Implant) ×4 IMPLANT
CLOTH BEACON ORANGE TIMEOUT ST (SAFETY) ×2 IMPLANT
COVER MAYO STAND STRL (DRAPES) ×2 IMPLANT
COVER SURGICAL LIGHT HANDLE (MISCELLANEOUS) ×2 IMPLANT
DRAPE C-ARM 42X72 X-RAY (DRAPES) IMPLANT
DRAPE INCISE IOBAN 66X45 STRL (DRAPES) ×8 IMPLANT
DRAPE ORTHO SPLIT 77X108 STRL (DRAPES) ×2
DRAPE PROXIMA HALF (DRAPES) ×4 IMPLANT
DRAPE STERI IOBAN 125X83 (DRAPES) IMPLANT
DRAPE SURG ORHT 6 SPLT 77X108 (DRAPES) ×2 IMPLANT
DRAPE U-SHAPE 47X51 STRL (DRAPES) ×2 IMPLANT
DRILL 4.3MMS DISTAL GRADUATED (BIT) ×2
DRSG ADAPTIC 3X8 NADH LF (GAUZE/BANDAGES/DRESSINGS) ×2 IMPLANT
DRSG MEPILEX BORDER 4X4 (GAUZE/BANDAGES/DRESSINGS) ×2 IMPLANT
DRSG MEPILEX BORDER 4X8 (GAUZE/BANDAGES/DRESSINGS) ×4 IMPLANT
DRSG PAD ABDOMINAL 8X10 ST (GAUZE/BANDAGES/DRESSINGS) ×6 IMPLANT
DURAPREP 26ML APPLICATOR (WOUND CARE) ×2 IMPLANT
ELECT BLADE 4.0 EZ CLEAN MEGAD (MISCELLANEOUS) ×2
ELECT REM PT RETURN 9FT ADLT (ELECTROSURGICAL) ×2
ELECTRODE BLDE 4.0 EZ CLN MEGD (MISCELLANEOUS) ×1 IMPLANT
ELECTRODE REM PT RTRN 9FT ADLT (ELECTROSURGICAL) ×1 IMPLANT
GLOVE BIO SURGEON STRL SZ7.5 (GLOVE) ×2 IMPLANT
GLOVE BIO SURGEON STRL SZ8.5 (GLOVE) ×4 IMPLANT
GLOVE BIOGEL PI IND STRL 6.5 (GLOVE) ×1 IMPLANT
GLOVE BIOGEL PI IND STRL 8 (GLOVE) ×2 IMPLANT
GLOVE BIOGEL PI INDICATOR 6.5 (GLOVE) ×1
GLOVE BIOGEL PI INDICATOR 8 (GLOVE) ×2
GLOVE BIOGEL PI ORTHO PRO 7.5 (GLOVE) ×1
GLOVE BIOGEL PI ORTHO PRO SZ8 (GLOVE) ×1
GLOVE ORTHO TXT STRL SZ7.5 (GLOVE) ×2 IMPLANT
GLOVE PI ORTHO PRO STRL 7.5 (GLOVE) ×1 IMPLANT
GLOVE PI ORTHO PRO STRL SZ8 (GLOVE) ×1 IMPLANT
GLOVE SURG ORTHO 8.5 STRL (GLOVE) ×2 IMPLANT
GOWN STRL NON-REIN LRG LVL3 (GOWN DISPOSABLE) ×6 IMPLANT
GOWN STRL REIN XL XLG (GOWN DISPOSABLE) ×4 IMPLANT
GUIDEPIN 3.2X17.5 THRD DISP (PIN) ×2 IMPLANT
GUIDEWIRE BALL NOSE 80CM (WIRE) ×2 IMPLANT
HIP FRAC NAIL LEFT 11X360MM (Orthopedic Implant) ×2 IMPLANT
HOVERMATT SINGLE USE (MISCELLANEOUS) ×2 IMPLANT
KIT BASIN OR (CUSTOM PROCEDURE TRAY) ×2 IMPLANT
KIT ROOM TURNOVER OR (KITS) ×2 IMPLANT
MANIFOLD NEPTUNE II (INSTRUMENTS) ×2 IMPLANT
NAIL HIP FRAC LEFT 11X360MM (Orthopedic Implant) ×1 IMPLANT
NS IRRIG 1000ML POUR BTL (IV SOLUTION) ×2 IMPLANT
PACK GENERAL/GYN (CUSTOM PROCEDURE TRAY) ×2 IMPLANT
PAD ARMBOARD 7.5X6 YLW CONV (MISCELLANEOUS) ×4 IMPLANT
PAD CAST 4YDX4 CTTN HI CHSV (CAST SUPPLIES) ×1 IMPLANT
PADDING CAST COTTON 4X4 STRL (CAST SUPPLIES) ×2
SCREW BONE CORTICAL 5.0X42 (Screw) ×2 IMPLANT
SCREW LAG HIP NAIL 10.5X95 (Screw) ×2 IMPLANT
SCREWDRIVER HEX TIP 3.5MM (MISCELLANEOUS) ×2 IMPLANT
STAPLER VISISTAT 35W (STAPLE) ×2 IMPLANT
SUCTION FRAZIER TIP 10 FR DISP (SUCTIONS) ×2 IMPLANT
SUT ETHILON 4 0 PS 2 18 (SUTURE) IMPLANT
SUT VIC AB 0 CT1 27 (SUTURE) ×6
SUT VIC AB 0 CT1 27XBRD ANBCTR (SUTURE) ×3 IMPLANT
SUT VIC AB 0 CTB1 27 (SUTURE) IMPLANT
SUT VIC AB 0 CTX 18 (SUTURE) ×2 IMPLANT
SUT VIC AB 2-0 CT1 27 (SUTURE) ×2
SUT VIC AB 2-0 CT1 TAPERPNT 27 (SUTURE) ×2 IMPLANT
WATER STERILE IRR 1000ML POUR (IV SOLUTION) ×4 IMPLANT

## 2013-05-08 NOTE — Anesthesia Procedure Notes (Signed)
Procedure Name: Intubation Date/Time: 05/08/2013 1:37 PM Performed by: Carola Frost Pre-anesthesia Checklist: Patient identified, Timeout performed, Emergency Drugs available, Suction available and Patient being monitored Patient Re-evaluated:Patient Re-evaluated prior to inductionOxygen Delivery Method: Circle system utilized Preoxygenation: Pre-oxygenation with 100% oxygen Intubation Type: IV induction Ventilation: Mask ventilation without difficulty Tube size: 7.5 mm Number of attempts: 1 Airway Equipment and Method: Stylet and Video-laryngoscopy Placement Confirmation: positive ETCO2,  ETT inserted through vocal cords under direct vision and breath sounds checked- equal and bilateral Secured at: 21 cm Tube secured with: Tape Dental Injury: Teeth and Oropharynx as per pre-operative assessment

## 2013-05-08 NOTE — Progress Notes (Signed)
Patient scheduled for 1315 surgery to pin left hip.  OR confirmed that pick-up time from 4740 will be around 12:15.  Attempted to call pt's daughter, May Fultz @ [phone] x 4 times without success.  Will continue to call to inform daughter of surgery time.

## 2013-05-08 NOTE — OR Nursing (Addendum)
Hover Mat used to transfer patient on to fracture table and off fracture table to patient's bed

## 2013-05-08 NOTE — Progress Notes (Signed)
ANTICOAGULATION CONSULT NOTE - Initial Consult  Pharmacy Consult for Coumadin Indication: atrial fibrillation  Allergies  Allergen Reactions  . Codeine     Patient Measurements: Height: 5\' 6"  (167.6 cm) Weight: 322 lb (146.058 kg) IBW/kg (Calculated) : 59.3 Heparin Dosing Weight:   Vital Signs: Temp: 97.6 F (36.4 C) (06/08 1934) Temp src: Oral (06/08 1934) BP: 146/66 mmHg (06/08 1934) Pulse Rate: 90 (06/08 1934)  Labs:  Recent Labs  05/06/13 2124 05/07/13 0635 05/07/13 1302 05/08/13 0508 05/08/13 1617  HGB 8.4* 8.0*  --   --  8.8*  HCT 27.6* 26.2*  --   --  26.0*  PLT 154 140*  --   --   --   APTT 39*  --   --   --   --   LABPROT 33.2*  --  30.5* 20.5*  --   INR 3.51*  --  3.13* 1.83*  --   CREATININE 0.88 0.83  --  1.21*  --     Estimated Creatinine Clearance: 65.1 ml/min (by C-G formula based on Cr of 1.21).   Medical History: Past Medical History  Diagnosis Date  . Anemia   . Arthritis   . Asthma   . Cataract     had surgery  . CHF (congestive heart failure)   . Diabetes mellitus   . Hyperlipidemia   . Hypertension   . CAD (coronary artery disease)     LAD 60% proximal stenosis mid and distal 60% stenosis 2009.  . Stroke     2 mini  . Oxygen dependent     2 liter per nasal cannula  . Gait instability     uses cane  . GERD (gastroesophageal reflux disease)   . Obesity   . Sleep apnea, obstructive 2008  . Atrial fibrillation   . COPD (chronic obstructive pulmonary disease)     Medications:  Prescriptions prior to admission  Medication Sig Dispense Refill  . alendronate (FOSAMAX) 70 MG tablet Take 1 tablet (70 mg total) by mouth every 7 (seven) days.  4 tablet  2  . diltiazem (CARDIZEM CD) 180 MG 24 hr capsule Take 180 mg by mouth daily.      . fish oil-omega-3 fatty acids 1000 MG capsule Take 2 g by mouth daily.        . furosemide (LASIX) 20 MG tablet Take 1 tablet by mouth 3 (three) times daily.       Marland Kitchen ibuprofen (ADVIL,MOTRIN) 200 MG  tablet Take 200 mg by mouth at bedtime.      Marland Kitchen LEVEMIR FLEXPEN 100 UNIT/ML injection Inject 43 Units into the skin Twice daily. After breadfast and at 9pm      . lisinopril (PRINIVIL,ZESTRIL) 20 MG tablet Take 20 mg by mouth daily.       . metFORMIN (GLUCOPHAGE) 500 MG tablet Take 500 mg by mouth 2 (two) times daily with a meal.        . potassium chloride SA (K-DUR,KLOR-CON) 20 MEQ tablet Take 20 mEq by mouth.      . rosuvastatin (CRESTOR) 10 MG tablet Take 10 mg by mouth at bedtime.      Marland Kitchen warfarin (COUMADIN) 5 MG tablet Take 5 mg by mouth See admin instructions. Take every evening except Monday.  No dose on Monday.      Marland Kitchen oxyCODONE-acetaminophen (PERCOCET) 10-325 MG per tablet Take 1 tablet by mouth every 8 (eight) hours as needed for pain.  30 tablet  0    Assessment:  [id number] now s/p ortho procedure to restart Coumadin for Afib per discussion with Dr. Veverly Fells. Per 6[facility]5 [facility]in [facility] notes, Coumadin regimen was decreased to 5mg  daily (previously 5mg  daily except 7.5mg  on M/F) for elevated INR (4.4) - will restart with this regimen. Patient did receive 10mg  PO Vitamin K during admission - INR may be resistant to increase initially. Patient will also be receiving Lovenox until INR is therapeutic.  - AM INR 1.83 - Hg 8.8 - No significant bleeding reported  Goal of Therapy:  INR 2-3   Plan:  1. Coumadin 5mg  po x 1 today 2. Daily INR 3. Lovenox 40mg  SQ daily - first dose ~18hrs post op per Dr. Gershon Cull, Patsey Berthold [phone] 05/08/2013,7:41 PM

## 2013-05-08 NOTE — Progress Notes (Signed)
ANTICOAGULATION CONSULT NOTE - Follow Up Consult  Pharmacy Consult for heparin Indication: atrial fibrillation  Labs:  Recent Labs  05/06/13 2124 05/07/13 0635 05/07/13 1302 05/08/13 0508  HGB 8.4* 8.0*  --   --   HCT 27.6* 26.2*  --   --   PLT 154 140*  --   --   APTT 39*  --   --   --   LABPROT 33.2*  --  30.5* 20.5*  INR 3.51*  --  3.13* 1.83*  CREATININE 0.88 0.83  --  1.21*    Assessment: 67yo female now with subtherapeutic INR after vitamin K; awaiting decision Re: surgery, team had been hoping for INR <1.7 for OR; Hgb low but stable.  Goal of Therapy:  Heparin level 0.3-0.7 units/ml   Plan:  Will begin heparin gtt at 1600 units/hr without bolus and monitor heparin levels and CBC.  Wynona Neat, PharmD, BCPS  05/08/2013,6:44 AM

## 2013-05-08 NOTE — Progress Notes (Signed)
Head to toe assessment completed except for back side - pt refused, stated unable to turn for assessment.

## 2013-05-08 NOTE — Op Note (Signed)
NAMEMarland [name]  Wendy, [name] NO.:  [id number]  MEDICAL RECORD NO.:  [id number]  LOCATION:  6                         FACILITY:  [facility]  PHYSICIAN:  Doran Heater. Veverly Fells, M.D. DATE OF BIRTH:  [birth year]  DATE OF PROCEDURE:  05/08/2013 DATE OF DISCHARGE:                              OPERATIVE REPORT   PREOPERATIVE DIAGNOSIS:  Left displaced subtrochanteric femur fracture.  POSTOPERATIVE DIAGNOSIS:  Left displaced subtrochanteric femur fracture.  PROCEDURE PERFORMED:  Open reduction and internal fixation of left displaced subtrochanteric femur fracture with combination of Zimmer cables as well as nails x2 as well as a Biomet trochanter intramedullary nail, long.  ATTENDING SURGEON:  Doran Heater. Veverly Fells, MD.  ASSISTANT:  Abbott Pao. Dixon, P.A. who was scrubbed the entire procedure, necessary for satisfactory completion of surgery.  ANESTHESIA:  General anesthesia was used.  ESTIMATED BLOOD LOSS:  4 mL.  FLUID REPLACEMENT:  650 mL of packed red cells and 1000 crystalloid.  INSTRUMENT COUNTS:  Correct.  COUNTS:  Correct.  COMPLICATIONS:  There were no complications.  ANTIBIOTICS:  Perioperative antibiotics were given.  INDICATIONS:  Patient is a 67 year old female who suffered a ground- level fall several days ago.  She initially presented to the [facility] with a displaced subtrochanteric femur fracture and was admitted.  Orthopedics was consulted.  Dr. Arther Abbott, orthopedic surgeon, felt that due to the patient's morbid obesity, chronic Coumadin, multiple medical comorbidities that it was better for the patient be transferred to [facility] for management of her perioperative medical condition and orthopedic surgery down here.  I accepted the patient in transfer from the orthopedic standpoint.  The patient was admitted to Medicine and preoperatively worked up.  Her INR was brought down with vitamin K, and ceasing her Coumadin, we were  able to get her INR down to 1.8 this morning, and it was felt that she would be below 1.75 this afternoon and safe for surgery.  Family understood the high perioperative risk including morbidity and mortality, but understood that there was a need to reduce this fracture and stabilize it in order to allow for the mobilization the patient.  Informed consent was obtained.  DESCRIPTION OF PROCEDURE:  After adequate level of anesthesia was achieved, the patient was positioned against the perineal post.  She is 330 pounds.  We stabilized her combination straps and sheaths tied around the table.  We were able to despite severe lymphedema place her in a traction boot, secure that with additional Coban and tape.  We padded the well leg and placed in modified lithotomy position, stabilized the patient, and then obtained images with our C-arm.  We brought the C-arm into the field and we were able to with traction and rotation get acceptable axial alignment, length alignment but unfortunately on the lateral, she had least the bone diameters with displacement between the fractured ends which is consistent with a subtrochanteric pattern.  The proximal fragments flexed very badly.  We sterilely prepped and draped the patient with a shower curtain.  Time- out called, identified the location of the fracture site just inferior to the lesser trochanter.  We went ahead and made a lateral skin incision.  Dissection  down through subcutaneous tissues using Bovie.  We identified the fascia lata, divided the vastus lateralis fascia using a scissors poke spread type technique longitudinally in line with its fibers, was able to identify the fracture site, get on the posterior aspect of the distal fragment in the anterior aspect of the proximal fragment, placed a bone hook and then a large crab claw was able to hold position and then slipped to Zimmer cables around the fracture and provisionally tighten those with  the fracture in good alignment.  We went ahead and found our proximal starting point in longitudinal incision on the hip.  Dissection down through subcutaneous tissues using Bovie, identified the fascia, divided that with pokes spread with curved Mayo scissors.  We were able to find our starting point with our guide pin and drilled down into the femur and used our step-cut drill over the proximal femur with a good starting point there on the AP and lateral. We then went ahead and placed our suitcase handle reduction tool into the proper position.  We were able to pass a ball-tipped guidewire down the femur.  We sized it to a size 36 cm x 11 nail.  This is a trochanteric nail.  Next, we went ahead and reamed up to a size 13 to accept this 11 nail.  We introduced the nail across the fracture site gaining good reduction with the nail in place to the appropriate depth, and the fracture reduction verified.  I went ahead and secured final tightening on the Zimmer cables, tightened those set screws down and clipped the cables.  We made a good reduction there, went ahead and then placed our lag screw up into the femoral head, centered on the lateral and AP, a little bit low on AP and placed a 95 mm lag screw into good bone.  We then put our set screw in up top and tightened that down.  We did not back that off _________ a fixed angle device for the subtrochanteric  pattern.  We then thoroughly irrigated.  We removed the insertion handle, abducted the leg, found our perfect circle laterally and then with a freehand technique, placed our distal interlock.  We obtained final x-rays, AP and lateral, in all planes.  We were happy with at each site.  We were happy with the reduction and the hardware placement, thoroughly irrigated all wounds and then closed in layers with the fascia first interrupted 0 Vicryl figure-of-eight followed by 0 and 2-0 Vicryl subcutaneous closure and staples for skin.   Sterile compressive bandage was applied.  The patient was transported to recovery room in stable condition.     Doran Heater. Veverly Fells, M.D.     SRN/MEDQ  D:  05/08/2013  T:  05/08/2013  Job:  [id number]

## 2013-05-08 NOTE — Progress Notes (Signed)
Orthopedics Progress Note  Subjective: I am not hurting right now.  Objective:  Filed Vitals:   05/08/13 0800  BP:   Pulse:   Temp:   Resp: 16    General: Awake and alert  Musculoskeletal: Left leg with lymphadema, NVI Neurovascularly intact  Lab Results  Component Value Date   WBC 10.6* 05/07/2013   HGB 8.0* 05/07/2013   HCT 26.2* 05/07/2013   MCV 87.0 05/07/2013   PLT 140* 05/07/2013       Component Value Date/Time   NA 141 05/08/2013 0508   K 4.5 05/08/2013 0508   CL 107 05/08/2013 0508   CO2 29 05/08/2013 0508   GLUCOSE 135* 05/08/2013 0508   BUN 25* 05/08/2013 0508   CREATININE 1.21* 05/08/2013 0508   CALCIUM 8.7 05/08/2013 0508   GFRNONAA 45* 05/08/2013 0508   GFRAA 52* 05/08/2013 0508    Lab Results  Component Value Date   INR 1.83* 05/08/2013   INR 3.13* 05/07/2013   INR 3.51* 05/06/2013    Assessment/Plan: Left displaced subtroch femur fracture. INR coming down nicely. Plan surgery this afternoon. Patient understands and agrees.  npo  Doran Heater. Veverly Fells, MD 05/08/2013 9:04 AM

## 2013-05-08 NOTE — Progress Notes (Signed)
To OR for hip pinning procedure

## 2013-05-08 NOTE — Progress Notes (Signed)
Spoke daughter, [name], and she is aware of surgery time and is in route to hospital.  Heparin stopped per pre-op order.

## 2013-05-08 NOTE — Brief Op Note (Signed)
05/06/2013 - 05/08/2013  5:01 PM  PATIENT:  [name]  67 y.o. female  PRE-OPERATIVE DIAGNOSIS:  left femur fracture, displaced subtrochanteric fracture  POST-OPERATIVE DIAGNOSIS:  Left femur fracture, displaced subtroch  PROCEDURE:  Procedure(s): INTRAMEDULLARY (IM) NAIL FEMORAL--LONG (Left), with Zimmer cables x2  SURGEON:  Surgeon(s) and Role:    * Augustin Schooling, MD - Primary  PHYSICIAN ASSISTANT:   ASSISTANTS: Ventura Bruns, PA-C   ANESTHESIA:   general  EBL:  Total I/O In: 1407.6 [P.O.:60; I.V.:647.6; Blood:700] Out: 1075 [Urine:675; Blood:400]  BLOOD ADMINISTERED:650 CC PRBC, 2 units  DRAINS: none   LOCAL MEDICATIONS USED:  NONE  SPECIMEN:  No Specimen  DISPOSITION OF SPECIMEN:  N/A  COUNTS:  YES  TOURNIQUET:  * No tourniquets in log *  DICTATION: .Other Dictation: Dictation Number [id number]  PLAN OF CARE: Admit to inpatient   PATIENT DISPOSITION:  PACU - hemodynamically stable.   Delay start of Pharmacological VTE agent (>24hrs) due to surgical blood loss or risk of bleeding: no

## 2013-05-08 NOTE — Preoperative (Signed)
Beta Blockers   Reason not to administer Beta Blockers:Not Applicable 

## 2013-05-08 NOTE — Progress Notes (Signed)
Pt. Signed consent for procedure as per ordered.  Surgical PCR completed and came back positive for staph aureus.  Bactroban and CHG baths ordered per protocol.  Pt. Complaining of 8/10 pain in her left leg at the beginning of the shift.  After receiving pain medication, patient rested comfortably throughout the night.

## 2013-05-08 NOTE — Op Note (Signed)
NAMEMarland [name]  Wendy, [name] NO.:  [id number]  MEDICAL RECORD NO.:  [id number]  LOCATION:  49                         FACILITY:  [facility]  PHYSICIAN:  Doran Heater. Veverly Fells, M.D. DATE OF BIRTH:  1946-04-02  DATE OF PROCEDURE:  05/08/2013 DATE OF DISCHARGE:                              OPERATIVE REPORT   PREOPERATIVE DIAGNOSIS:  Left displaced subtrochanteric femur fracture.  POSTOPERATIVE DIAGNOSIS:  Left displaced subtrochanteric femur fracture.  PROCEDURE PERFORMED:  Open reduction and internal fixation utilizing combination of 2 Zimmer cables and Biomet long troch and intramedullary nail.  ATTENDING SURGEON:  Doran Heater. Veverly Fells, MD  ASSISTANT:  Abbott Pao. Dixon, PA-C, he was scrubbed the entire procedure and necessary for satisfactory completion of surgery.  ANESTHESIA:  General anesthesia was used.  ESTIMATED BLOOD LOSS:  400 mL.  FLUID REPLACEMENT:  1800 mL crystalloid as well as 2 units packed red blood cells.  INSTRUMENT COUNTS:  Correct.  COMPLICATIONS:  There were no complications.  ANTIBIOTICS:  Perioperative antibiotics were given.  INDICATIONS:  The patient is a 66 year old female who suffered a fall several days ago.  She was initially evaluated at [facility], including both primary care and Orthopedic consultation.  Dr. Kenton Kingfisher and orthopedic surgeon felt that due to her multiple medical comorbidities, obesity, and high perioperative risk that he recommended that she be transferred down to [facility] for treatment down here.  The patient was accepted and transferred and admitted and cleared medically, including normalization or near normalization of her INR and she is on chronic Coumadin for AFib.  I counseled the family that the patient was at extremely high perioperative risk for perioperative morbidity and mortality, they understand.  The patient has a displaced unstable subtrochanteric femur fracture that needs to be operatively  stabilized and aligned and patient and her family consented to surgery.  This was a difficult subtrochanteric pattern with the flexed proximal fragment necessitating open reduction.  Informed consent obtained.  DESCRIPTION OF PROCEDURE:  After adequate level of anesthesia was achieved, the patient was positioned on the operating room table.  She is 330 pounds.  The patient has extensive lymphedema.  The patient was positioned against the perineal post, secured to the table with a combination of straps and sheet tied around the patient.  The patient's right leg placed in modified lithotomy position, left leg correctly identified, despite severe lymphedema, we were able to adequately stabilize her foot in the traction boot with additional Coban and tape and thus not have to do a tibial traction pin.  The patient's leg was placed on traction and little bit of internal rotation, x-rays were brought in, which showed appropriate length on the traction, but very flexed proximal fragment at least femur diameter in between the fracture fragments.  The patient was then sterilely prepped and draped in usual manner with a shower curtain.  We initially localized the fracture site. I made a lateral incision at the fracture site just below the level of lesser trochanter.  After we called a time-out, we approached down through subcutaneous tissues, extensive fatty layers identified the tensor fascia lata divided vastus lateralis, gently split that encountering the fractured site, hematoma evacuated, fractured  femur was indeed displaced quite severely, was able to use a bone hook and get a hold of that proximal fragment get it into approximate alignment, get a crab claw on the area, then slip Zimmer cable insertion device around the back side of that staying on bone the whole way and get 2 Zimmer cables on the fracture with quite a bit of effort, but I was able to get those in good position and  provisionally tied those cables down.  Next, we went ahead with the fracture in good position and held in position, we identified the proper position for the proximal incision, longitudinal incision created behind the hip laterally.  Dissection down to the subcutaneous tissues using Bovie.  We identified the fascia and divided that bluntly using curved Mayo scissors.  I then went ahead that was basically pushed through and spread, identified the proximal starting point.   Dictation ended at this point.     Doran Heater. Veverly Fells, M.D.     SRN/MEDQ  D:  05/08/2013  T:  05/08/2013  Job:  [id number]

## 2013-05-08 NOTE — Transfer of Care (Signed)
Immediate Anesthesia Transfer of Care Note  Patient: [name]  Procedure(s) Performed: Procedure(s): INTRAMEDULLARY (IM) NAIL FEMORAL--LONG(BIOMET SYSTEM) and Zimmer Cables (Left)  Patient Location: PACU  Anesthesia Type:General  Level of Consciousness: sedated  Airway & Oxygen Therapy: Patient Spontanous Breathing and Patient connected to face mask oxygen  Post-op Assessment: Report given to PACU RN and Post -op Vital signs reviewed and stable  Post vital signs: Reviewed and stable  Complications: No apparent anesthesia complications

## 2013-05-08 NOTE — Anesthesia Preprocedure Evaluation (Signed)
Anesthesia Evaluation  Patient identified by MRN, date of birth, ID band Patient awake    Reviewed: Allergy & Precautions, H&P , NPO status , Patient's Chart, lab work & pertinent test results, reviewed documented beta blocker date and time   Airway Mallampati: II TM Distance: >3 FB Neck ROM: full    Dental   Pulmonary asthma , sleep apnea , COPD breath sounds clear to auscultation        Cardiovascular hypertension, Pt. on medications + CAD, + Past MI, + Peripheral Vascular Disease and +CHF + dysrhythmias Atrial Fibrillation Rhythm:regular     Neuro/Psych TIACVA, No Residual Symptoms negative psych ROS   GI/Hepatic Neg liver ROS, GERD-  Medicated and Controlled,  Endo/Other  diabetes, Oral Hypoglycemic AgentsMorbid obesity  Renal/GU negative Renal ROS  negative genitourinary   Musculoskeletal   Abdominal   Peds  Hematology negative hematology ROS (+)   Anesthesia Other Findings See surgeon's H&P   Reproductive/Obstetrics negative OB ROS                           Anesthesia Physical Anesthesia Plan  ASA: III  Anesthesia Plan: General   Post-op Pain Management:    Induction: Intravenous  Airway Management Planned: Oral ETT  Additional Equipment:   Intra-op Plan:   Post-operative Plan: Extubation in OR  Informed Consent: I have reviewed the patients History and Physical, chart, labs and discussed the procedure including the risks, benefits and alternatives for the proposed anesthesia with the patient or authorized representative who has indicated his/her understanding and acceptance.   Dental Advisory Given  Plan Discussed with: CRNA and Surgeon  Anesthesia Plan Comments:         Anesthesia Quick Evaluation

## 2013-05-08 NOTE — Anesthesia Postprocedure Evaluation (Signed)
Anesthesia Post Note  Patient: [name]  Procedure(s) Performed: Procedure(s) (LRB): INTRAMEDULLARY (IM) NAIL FEMORAL--LONG(BIOMET SYSTEM) and Zimmer Cables (Left)  Anesthesia type: General  Patient location: PACU  Post pain: Pain level controlled  Post assessment: Patient's Cardiovascular Status Stable  Last Vitals:  Filed Vitals:   05/08/13 1830  BP: 159/58  Pulse: 90  Temp: 36.3 C  Resp: 15    Post vital signs: Reviewed and stable  Level of consciousness: alert  Complications: No apparent anesthesia complications

## 2013-05-08 NOTE — Progress Notes (Signed)
Pt received from the OR,  AAox4 family at the bedside, questions answered. VSS

## 2013-05-08 NOTE — Progress Notes (Signed)
[facility] PROGRESS NOTE  [name] [id number] DOB: 1946/09/22 DOA: 05/06/2013 PCP: Redge Gainer, MD  Assessment/Plan  Left hip fracture:  -  Appreciate orthopedics assistance - To OR today  Atrial fibrillation: Paroxysmal atrial fibrillation, was in A. fib on telemetry with heart rate in the 60. - Continue Telemetry  - Continue diltiazem  - INR 1.8 after 2 doses of vitamin K -  Heparin drip preoperatively -  May resume anticoagulation postoperatively once orthopedics feels it is safe to do so  Diastolic heart failure, acute on chronic exacerbation of diastolic heart failure.  Give one dose of Lasix 40 mg IV, and labs indicate this morning and she is a little prerenal. -  Hold Lasix this morning preoperatively -  Will readdress fluid status in AM  Diabetes mellitus, fingersticks stable.  A1c 5.9 - Continue sliding scale insulin.  - Normally on 43 units of levemir twice daily.  Obstructive sleep apnea, stable. Continue nightly CPAP COPD, at risk for exacerbation post-operative, start prn duonebs  Morbid obesity, to be addressed as an outpatient  Diet:  Advance diet as tolerated once fully awake from anesthesia Access:  PIV IVF:  Off Proph:  Heparin drip  Code Status: Full code Family Communication: Spoke with patient alone Disposition Plan: Pending evaluation by physical therapy postoperatively   Consultants:  Dr. Veverly Fells, Orthopedic surgery  Procedures:  X-ray chest  X-ray left elbow 2 views  X-ray hip left  X-ray left shoulder  Antibiotics:  None   HPI/Subjective:  10/10 pain in left hip.  Denies CP, SOB, nausea, vomiting, abdominal pain.    Objective: Filed Vitals:   05/08/13 0451 05/08/13 0800 05/08/13 0900 05/08/13 1200  BP: 130/68  127/60 148/64  Pulse: 66  72 74  Temp: 97.5 F (36.4 C)  98.5 F (36.9 C) 97.5 F (36.4 C)  TempSrc: Oral  Oral Oral  Resp: 18 16 18 18   Height:      Weight: 146.058 kg (322 lb)     SpO2: 98%  97% 97% 97%    Intake/Output Summary (Last 24 hours) at 05/08/13 1550 Last data filed at 05/08/13 1530  Gross per 24 hour  Intake 777.56 ml  Output   1675 ml  Net -897.44 ml   Filed Weights   05/06/13 2331 05/06/13 2333 05/08/13 0451  Weight: 144.4 kg (318 lb 5.5 oz) 144.4 kg (318 lb 5.5 oz) 146.058 kg (322 lb)    Exam:  GEN: Obese Caucasian female, no acute distress, bearhugger  HEENT: Nasal cannula in place  CV:  IRRR, no mrg PULM: Clear to auscultation anteriorly. No increased WOB  MSK: She has pain in the left lateral hip, 39mm of bright red blood on most superior bandage postoperatively She has 2+ bilateral pedal pulses. She is able to wiggle her toes on both legs.    Data Reviewed: Basic Metabolic Panel:  Recent Labs Lab 05/06/13 2124 05/07/13 0635 05/08/13 0508  NA 140 140 141  K 4.2 5.3* 4.5  CL 105 107 107  CO2 27 26 29   GLUCOSE 153* 166* 135*  BUN 20 20 25*  CREATININE 0.88 0.83 1.21*  CALCIUM 8.8 8.4 8.7   Liver Function Tests:  Recent Labs Lab 05/06/13 2124  AST 14  ALT 10  ALKPHOS 144*  BILITOT 0.7  PROT 6.7  ALBUMIN 3.3*   No results found for this basename: LIPASE, AMYLASE,  in the last 168 hours No results found for this basename: AMMONIA,  in the last 168  hours CBC:  Recent Labs Lab 05/06/13 2124 05/07/13 0635  WBC 15.1* 10.6*  NEUTROABS 13.7*  --   HGB 8.4* 8.0*  HCT 27.6* 26.2*  MCV 86.0 87.0  PLT 154 140*   Cardiac Enzymes: No results found for this basename: CKTOTAL, CKMB, CKMBINDEX, TROPONINI,  in the last 168 hours BNP (last 3 results)  Recent Labs  05/07/13 1302  PROBNP 3126.0*   CBG:  Recent Labs Lab 05/07/13 1943 05/08/13 0006 05/08/13 0449 05/08/13 0759 05/08/13 1206  GLUCAP 153* 132* 122* 132* 112*    Recent Results (from the past 240 hour(s))  SURGICAL PCR SCREEN     Status: Abnormal   Collection Time    05/07/13  8:28 PM      Result Value Range Status   MRSA, PCR NEGATIVE  NEGATIVE Final    Staphylococcus aureus POSITIVE (*) NEGATIVE Final   Comment:            The Xpert SA Assay (FDA     approved for NASAL specimens     in patients over 19 years of age),     is one component of     a comprehensive surveillance     program.  Test performance has     been validated by [employer] for patients greater     than or equal to 14 year old.     It is not intended     to diagnose infection nor to     guide or monitor treatment.     Studies: Dg Chest 1 View  05/06/2013   *RADIOLOGY REPORT*  Clinical Data: Status post fall; concern for chest injury.  CHEST - 1 VIEW  Comparison: Chest radiograph performed 03/09/2013  Findings: The lungs are well-aerated.  Vascular congestion is noted, with chronically increased interstitial markings.  Some of this may be transient in nature.  No pleural effusion or pneumothorax is seen.  The cardiomediastinal silhouette is enlarged.  No acute osseous abnormalities are seen.  Cervical spinal fusion hardware is partially imaged.  IMPRESSION:  1.  No displaced rib fractures seen. 2.  Vascular congestion and cardiomegaly; the degree of vascular congestion may be transient in nature, given recent fall.   Original Report Authenticated By: Santa Lighter, M.D.   Dg Elbow 2 Views Left  05/06/2013   *RADIOLOGY REPORT*  Clinical Data: Left elbow bruising, status post fall.  LEFT ELBOW - 2 VIEW  Comparison: None.  Findings: There is no evidence of fracture or dislocation.  The visualized joint spaces are preserved.  No significant joint effusion is identified.  Known soft tissue bruising is not well characterized on radiograph.  IMPRESSION: No evidence of fracture or dislocation.   Original Report Authenticated By: Santa Lighter, M.D.   Dg Hip Complete Left  05/06/2013   *RADIOLOGY REPORT*  Clinical Data: Left hip pain status post fall.  LEFT HIP - COMPLETE 2+ VIEW  Comparison: CT of the abdomen and pelvis performed 03/17/2013  Findings: There is a spiral fracture  extending across the left proximal femoral diaphysis, with an associated displaced lesser trochanteric fragment.  The left femoral neck remains intact.  The femoral heads remains seated within their respective acetabula. The right hip joint is unremarkable in appearance.  The degree of displacement of the fracture varies depending on positioning.  The sacroiliac joints are unremarkable in appearance.  The visualized bowel gas pattern is grossly unremarkable.  No significant soft tissue abnormalities are characterized on radiograph.  IMPRESSION: Variably displaced spiral fracture extending across the left proximal femoral diaphysis, with an associated displaced lesser trochanteric fragment.  The left femoral neck remains intact.   Original Report Authenticated By: Santa Lighter, M.D.   Dg Shoulder Left Port  05/07/2013   *RADIOLOGY REPORT*  Clinical Data: 67 year old female status post fall with pain.  Left clavicle fracture 6 weeks ago.  PORTABLE LEFT SHOULDER - 2+ VIEW  Comparison: 03/09/2013 and earlier.  Findings: Portable views of the left shoulder. No glenohumeral joint dislocation.  Proximal left humerus appears stable and intact.  Left scapula appears intact.  No displaced left clavicle fracture.  Chronic left lateral rib fractures again noted including the 7th rib.  Cervical ACDF hardware partially re-identified.  IMPRESSION: No acute fracture or dislocation identified about the left shoulder.   Original Report Authenticated By: Roselyn Reef, M.D.    Scheduled Meds: . [facility] HOLD] antiseptic oral rinse  15 mL Mouth Rinse BID  . [facility] HOLD] atorvastatin  20 mg Oral q1800  . [[facility] HOLD]  ceFAZolin (ANCEF) IV  3 g Intravenous Q8H  . [[facility] HOLD] Chlorhexidine Gluconate Cloth  6 each Topical Daily  . [facility] HOLD] diltiazem  180 mg Oral Daily  . [MAR HOLD] insulin aspart  0-20 Units Subcutaneous Q4H  . [facility] HOLD] mupirocin ointment  1 application Nasal BID   Continuous Infusions:   Active Problems:    FIBRILLATION, ATRIAL   CONGESTIVE HEART FAILURE (CHF)   OBSTRUCTIVE SLEEP APNEA   Hip fracture   Diabetes mellitus    Time spent: 30 min    Anaja Monts, [facility] Pager [phone]. If 7PM-7AM, please contact night-coverage at [email or web address], password [facility] 05/08/2013, 3:50 PM  LOS: 2 days

## 2013-05-09 ENCOUNTER — Encounter (HOSPITAL_COMMUNITY): Payer: Self-pay | Admitting: Orthopedic Surgery

## 2013-05-09 DIAGNOSIS — D500 Iron deficiency anemia secondary to blood loss (chronic): Secondary | ICD-10-CM

## 2013-05-09 DIAGNOSIS — IMO0001 Reserved for inherently not codable concepts without codable children: Secondary | ICD-10-CM

## 2013-05-09 DIAGNOSIS — D72829 Elevated white blood cell count, unspecified: Secondary | ICD-10-CM

## 2013-05-09 DIAGNOSIS — J449 Chronic obstructive pulmonary disease, unspecified: Secondary | ICD-10-CM

## 2013-05-09 DIAGNOSIS — J4489 Other specified chronic obstructive pulmonary disease: Secondary | ICD-10-CM

## 2013-05-09 DIAGNOSIS — D696 Thrombocytopenia, unspecified: Secondary | ICD-10-CM

## 2013-05-09 LAB — TYPE AND SCREEN
ABO/RH(D): A NEG
Antibody Screen: NEGATIVE
Unit division: 0
Unit division: 0

## 2013-05-09 LAB — CBC
Hemoglobin: 9.8 g/dL — ABNORMAL LOW (ref 12.0–15.0)
MCH: 27.1 pg (ref 26.0–34.0)
MCV: 83.7 fL (ref 78.0–100.0)
RBC: 3.61 MIL/uL — ABNORMAL LOW (ref 3.87–5.11)

## 2013-05-09 LAB — BASIC METABOLIC PANEL
CO2: 25 mEq/L (ref 19–32)
Glucose, Bld: 149 mg/dL — ABNORMAL HIGH (ref 70–99)
Potassium: 4.1 mEq/L (ref 3.5–5.1)
Sodium: 140 mEq/L (ref 135–145)

## 2013-05-09 LAB — PROTIME-INR
INR: 1.42 (ref 0.00–1.49)
Prothrombin Time: 17 seconds — ABNORMAL HIGH (ref 11.6–15.2)

## 2013-05-09 LAB — GLUCOSE, CAPILLARY
Glucose-Capillary: 149 mg/dL — ABNORMAL HIGH (ref 70–99)
Glucose-Capillary: 163 mg/dL — ABNORMAL HIGH (ref 70–99)

## 2013-05-09 MED ORDER — FUROSEMIDE 20 MG PO TABS
20.0000 mg | ORAL_TABLET | Freq: Three times a day (TID) | ORAL | Status: DC
  Administered 2013-05-09 – 2013-05-12 (×10): 20 mg via ORAL
  Filled 2013-05-09 (×12): qty 1

## 2013-05-09 MED ORDER — LISINOPRIL 20 MG PO TABS
20.0000 mg | ORAL_TABLET | Freq: Every day | ORAL | Status: DC
  Administered 2013-05-09 – 2013-05-12 (×4): 20 mg via ORAL
  Filled 2013-05-09 (×4): qty 1

## 2013-05-09 MED ORDER — INSULIN ASPART 100 UNIT/ML ~~LOC~~ SOLN
0.0000 [IU] | Freq: Three times a day (TID) | SUBCUTANEOUS | Status: DC
  Administered 2013-05-10 (×2): 4 [IU] via SUBCUTANEOUS

## 2013-05-09 MED ORDER — WARFARIN SODIUM 5 MG PO TABS
5.0000 mg | ORAL_TABLET | Freq: Once | ORAL | Status: AC
  Administered 2013-05-09: 5 mg via ORAL
  Filled 2013-05-09: qty 1

## 2013-05-09 MED ORDER — OXYCODONE HCL 5 MG PO TABS
5.0000 mg | ORAL_TABLET | ORAL | Status: DC | PRN
  Administered 2013-05-09 – 2013-05-10 (×2): 5 mg via ORAL
  Administered 2013-05-10 (×2): 10 mg via ORAL
  Administered 2013-05-11 – 2013-05-12 (×3): 5 mg via ORAL
  Administered 2013-05-12: 10 mg via ORAL
  Filled 2013-05-09 (×3): qty 1
  Filled 2013-05-09: qty 2
  Filled 2013-05-09 (×3): qty 1
  Filled 2013-05-09 (×2): qty 2

## 2013-05-09 MED ORDER — MORPHINE SULFATE 4 MG/ML IJ SOLN
4.0000 mg | INTRAMUSCULAR | Status: DC | PRN
  Administered 2013-05-09 (×2): 4 mg via INTRAVENOUS
  Filled 2013-05-09 (×2): qty 1

## 2013-05-09 NOTE — Progress Notes (Signed)
   Subjective: 1 Day Post-Op Procedure(s) (LRB): INTRAMEDULLARY (IM) NAIL FEMORAL--LONG(BIOMET SYSTEM) and Zimmer Cables (Left)  Mild to moderate pain to left hip this morning Otherwise doing okay Patient reports pain as moderate.  Objective:   VITALS:   Filed Vitals:   05/09/13 0442  BP: 140/46  Pulse: 88  Temp: 98.5 F (36.9 C)  Resp: 20   Left hip incision healing appropriately No drainage Neurologically intact distally  LABS  Recent Labs  05/07/13 0635 05/08/13 1617 05/08/13 2017 05/09/13 0525  HGB 8.0* 8.8* 11.0* 9.8*  HCT 26.2* 26.0* 33.2* 30.2*  WBC 10.6*  --  16.5* 11.7*  PLT 140*  --  138* 134*     Recent Labs  05/06/13 2124 05/07/13 0635 05/08/13 0508 05/08/13 1617 05/08/13 2017  NA 140 140 141 141  --   K 4.2 5.3* 4.5 5.0  --   BUN 20 20 25*  --   --   CREATININE 0.88 0.83 1.21*  --  0.93  GLUCOSE 153* 166* 135* 148*  --      Assessment/Plan: 1 Day Post-Op Procedure(s) (LRB): INTRAMEDULLARY (IM) NAIL FEMORAL--LONG(BIOMET SYSTEM) and Zimmer Cables (Left) Non weight bearing left lower extremity PT/OT as able Pain control   Brad Olivianna Higley, MPAS, PA-C  05/09/2013, 7:08 AM

## 2013-05-09 NOTE — Progress Notes (Signed)
ANTICOAGULATION CONSULT NOTE - Follow up [facility] for Coumadin Indication: atrial fibrillation  Allergies  Allergen Reactions  . Codeine     Patient Measurements: Height: 5\' 6"  (167.6 cm) Weight: 325 lb 2.9 oz (147.5 kg) (bed) IBW/kg (Calculated) : 59.3  Vital Signs: Temp: 99.2 F (37.3 C) (06/09 1004) Temp src: Oral (06/09 1004) BP: 149/53 mmHg (06/09 1004) Pulse Rate: 87 (06/09 1004)  Labs:  Recent Labs  05/06/13 2124 05/07/13 0635 05/07/13 1302 05/08/13 0508 05/08/13 1617 05/08/13 2017 05/09/13 0525  HGB 8.4* 8.0*  --   --  8.8* 11.0* 9.8*  HCT 27.6* 26.2*  --   --  26.0* 33.2* 30.2*  PLT 154 140*  --   --   --  138* 134*  APTT 39*  --   --   --   --   --   --   LABPROT 33.2*  --  30.5* 20.5*  --   --  17.0*  INR 3.51*  --  3.13* 1.83*  --   --  1.42  CREATININE 0.88 0.83  --  1.21*  --  0.93 0.89    Estimated Creatinine Clearance: 89.1 ml/min (by C-G formula based on Cr of 0.89).   Medical History: Past Medical History  Diagnosis Date  . Anemia   . Arthritis   . Asthma   . Cataract     had surgery  . CHF (congestive heart failure)   . Diabetes mellitus   . Hyperlipidemia   . Hypertension   . CAD (coronary artery disease)     LAD 60% proximal stenosis mid and distal 60% stenosis 2009.  . Stroke     2 mini  . Oxygen dependent     2 liter per nasal cannula  . Gait instability     uses cane  . GERD (gastroesophageal reflux disease)   . Obesity   . Sleep apnea, obstructive 2008  . Atrial fibrillation   . COPD (chronic obstructive pulmonary disease)     Medications:  Prescriptions prior to admission  Medication Sig Dispense Refill  . alendronate (FOSAMAX) 70 MG tablet Take 1 tablet (70 mg total) by mouth every 7 (seven) days.  4 tablet  2  . diltiazem (CARDIZEM CD) 180 MG 24 hr capsule Take 180 mg by mouth daily.      . fish oil-omega-3 fatty acids 1000 MG capsule Take 2 g by mouth daily.        . furosemide (LASIX) 20 MG  tablet Take 1 tablet by mouth 3 (three) times daily.       Marland Kitchen ibuprofen (ADVIL,MOTRIN) 200 MG tablet Take 200 mg by mouth at bedtime.      Marland Kitchen LEVEMIR FLEXPEN 100 UNIT/ML injection Inject 43 Units into the skin Twice daily. After breadfast and at 9pm      . lisinopril (PRINIVIL,ZESTRIL) 20 MG tablet Take 20 mg by mouth daily.       . metFORMIN (GLUCOPHAGE) 500 MG tablet Take 500 mg by mouth 2 (two) times daily with a meal.        . potassium chloride SA (K-DUR,KLOR-CON) 20 MEQ tablet Take 20 mEq by mouth.      . rosuvastatin (CRESTOR) 10 MG tablet Take 10 mg by mouth at bedtime.      Marland Kitchen warfarin (COUMADIN) 5 MG tablet Take 5 mg by mouth See admin instructions. Take every evening except Monday.  No dose on Monday.      Marland Kitchen oxyCODONE-acetaminophen (PERCOCET)  10-325 MG per tablet Take 1 tablet by mouth every 8 (eight) hours as needed for pain.  30 tablet  0    Assessment: 67 y/o female patient s/p ortho procedure receiving coumadin for h/o afib. Coumadin resumed yesterday, INR subtherapeutic. Lovenox until INR >2 Goal of Therapy:  INR 2-3   Plan:  Coumadin 5mg  today and f/u daily protime.  Davonna Belling, PharmD, BCPS Pager [phone] 05/09/2013,12:23 PM
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
## 2013-05-09 NOTE — Progress Notes (Signed)
[facility] PROGRESS NOTE  [name] [id number] DOB: 07/01/1946 DOA: 05/06/2013 PCP: Redge Gainer, MD  Assessment/Plan  Left displaced subtrochanteric femur fracture status post open reduction and internal fixation on June 8 by Dr. Esmond Plants.   -  Continue nonweightbearing left lower extremity per orthopedics -  Physical and occupational therapy recommending skilled nursing -  Will contact social work to start arrangements -  Encourage oral pain medications instead of IV  Atrial fibrillation: Paroxysmal atrial fibrillation, currently in atrial fibrillation with rate in the 80s - Continue diltiazem  - Coumadin resumed, INR 1.42 -  Lovenox for DVT prophylaxis pending therapeutic INR  Diastolic heart failure, acute on chronic exacerbation of diastolic heart failure resolved. -  Resume home Lasix dose  Elevated blood pressures, possible underlying hypertension -  Restart lisinopril -  Control pain  Diabetes mellitus, fingersticks stable.  A1c 5.9 - Continue sliding scale insulin.  - Normally on 43 units of levemir twice daily.  Obstructive sleep apnea, stable. 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  Repeat hemoglobin in the morning. Transfuse for hemoglobin less than 7. Repeat CBC primary care doctor in a few weeks if hemoglobin is stable. Mild thrombocytopenia, likely due to blood loss during surgery  Diet: Diabetic diet Access:  PIV IVF:  Off Proph:  Lovenox for DVT prophylaxis. Awaiting therapeutic INR  Code Status: Full code Family Communication: Spoke with patient alone Disposition Plan: To SNF   Consultants:  Dr. Veverly Fells, Orthopedic  surgery  Procedures:  X-ray chest  X-ray left elbow 2 views  X-ray hip left  X-ray left shoulder  Antibiotics:  None   HPI/Subjective:  6/10 pain in left hip.  Denies CP, SOB, nausea, vomiting, abdominal pain.    Objective: Filed Vitals:   05/09/13 0400 05/09/13 0442 05/09/13 0800 05/09/13 1004  BP:  140/46  149/53  Pulse:  88  87  Temp:  98.5 F (36.9 C)  99.2 F (37.3 C)  TempSrc:  Oral  Oral  Resp: 20 20 20 18   Height:      Weight:  147.5 kg (325 lb 2.9 oz)    SpO2: 95% 98% 96% 97%    Intake/Output Summary (Last 24 hours) at 05/09/13 1208 Last data filed at 05/09/13 0854  Gross per 24 hour  Intake 3661.67 ml  Output   1150 ml  Net 2511.67 ml   Filed Weights   05/06/13 2333 05/08/13 0451 05/09/13 0442  Weight: 144.4 kg (318 lb 5.5 oz) 146.058 kg (322 lb) 147.5 kg (325 lb 2.9 oz)    Exam:  GEN: Obese Caucasian female, no acute distress, awake, alert HEENT: MMM, Nasal cannula in place  CV:  IRRR, no mrg PULM: Clear to auscultation anteriorly. No increased WOB  MSK: She has pain in the left lateral hip, 33mm of dark red blood on most superior bandage postoperatively She has 2+ bilateral pedal pulses. She is able to wiggle her toes on both legs.    Data Reviewed: Basic Metabolic Panel:  Recent Labs Lab 05/06/13 2124 05/07/13 0635 05/08/13 0508 05/08/13 1617 05/08/13 2017 05/09/13 0525  NA 140 140 141 141  --  140  K 4.2 5.3* 4.5 5.0  --  4.1  CL 105 107 107  --   --  106  CO2 27 26 29   --   --  25  GLUCOSE 153* 166* 135* 148*  --  149*  BUN 20 20 25*  --   --  25*  CREATININE 0.88 0.83 1.21*  --  0.93 0.89  CALCIUM 8.8 8.4 8.7  --   --  8.6   Liver Function Tests:  Recent Labs Lab 05/06/13 2124  AST 14  ALT 10  ALKPHOS 144*  BILITOT 0.7  PROT 6.7  ALBUMIN 3.3*   No results found for this basename: LIPASE, AMYLASE,  in the last 168 hours No results found for this basename: AMMONIA,  in the last 168 hours CBC:  Recent Labs Lab  05/06/13 2124 05/07/13 0635 05/08/13 1617 05/08/13 2017 05/09/13 0525  WBC 15.1* 10.6*  --  16.5* 11.7*  NEUTROABS 13.7*  --   --   --   --   HGB 8.4* 8.0* 8.8* 11.0* 9.8*  HCT 27.6* 26.2* 26.0* 33.2* 30.2*  MCV 86.0 87.0  --  84.7 83.7  PLT 154 140*  --  138* 134*   Cardiac Enzymes: No results found for this basename: CKTOTAL, CKMB, CKMBINDEX, TROPONINI,  in the last 168 hours BNP (last 3 results)  Recent Labs  05/07/13 1302  PROBNP 3126.0*   CBG:  Recent Labs Lab 05/08/13 1856 05/08/13 2018 05/08/13 2351 05/09/13 0407 05/09/13 0819  GLUCAP 174* 195* 188* 149* 163*    Recent Results (from the past 240 hour(s))  URINE CULTURE     Status: None   Collection Time    05/06/13  9:51 PM      Result Value Range Status   Specimen Description URINE, CLEAN CATCH   Final   Special Requests NONE   Final   Culture  Setup Time 05/07/2013 19:42   Final   Colony Count NO GROWTH   Final   Culture NO GROWTH   Final   Report Status 05/08/2013 FINAL   Final  SURGICAL PCR SCREEN     Status: Abnormal   Collection Time    05/07/13  8:28 PM      Result Value Range Status   MRSA, PCR NEGATIVE  NEGATIVE Final   Staphylococcus aureus POSITIVE (*) NEGATIVE Final   Comment:            The Xpert SA Assay (FDA     approved for NASAL specimens     in patients over 34 years of age),     is one component of     a comprehensive surveillance     program.  Test performance has     been validated by [employer] for patients greater     than or equal to 65 year old.     It is not intended     to diagnose infection nor to     guide or monitor treatment.     Studies: Dg Hip Operative Left  05/08/2013   *RADIOLOGY REPORT*  Clinical Data: ORIF of left hip fracture.  OPERATIVE LEFT HIP  Comparison: None.  Findings: Femoral nail extending across the neck shows normal alignment.  Intramedullary rod is also present demonstrating normal alignment.  Alignment at the level of the proximal  femoral fracture is near anatomic.  IMPRESSION: Operative imaging showing near anatomic alignment after ORIF of the proximal left femur fracture.   Original Report Authenticated By: Aletta Edouard, M.D.   Dg Pelvis Portable  05/08/2013   *RADIOLOGY REPORT*  Clinical  Data: Status post ORIF of proximal left femur.  PORTABLE PELVIS  Comparison: Intraoperative images obtained earlier today.  Findings: Frontal projection of the pelvis and additional imaging of the proximal left femur demonstrate near anatomic alignment following ORIF of the proximal femoral fracture.  Femoral neck nail and intramedullary rod appears appropriately aligned.  IMPRESSION: Near anatomic alignment of the proximal left femur after ORIF.   Original Report Authenticated By: Aletta Edouard, M.D.   Dg Shoulder Left Port  05/07/2013   *RADIOLOGY REPORT*  Clinical Data: 67 year old female status post fall with pain.  Left clavicle fracture 6 weeks ago.  PORTABLE LEFT SHOULDER - 2+ VIEW  Comparison: 03/09/2013 and earlier.  Findings: Portable views of the left shoulder. No glenohumeral joint dislocation.  Proximal left humerus appears stable and intact.  Left scapula appears intact.  No displaced left clavicle fracture.  Chronic left lateral rib fractures again noted including the 7th rib.  Cervical ACDF hardware partially re-identified.  IMPRESSION: No acute fracture or dislocation identified about the left shoulder.   Original Report Authenticated By: Roselyn Reef, M.D.    Scheduled Meds: . antiseptic oral rinse  15 mL Mouth Rinse BID  . atorvastatin  20 mg Oral q1800  . Chlorhexidine Gluconate Cloth  6 each Topical Daily  . diltiazem  180 mg Oral Daily  . enoxaparin (LOVENOX) injection  40 mg Subcutaneous Q24H  . furosemide  20 mg Oral TID WC  . insulin aspart  0-20 Units Subcutaneous Q4H  . lisinopril  20 mg Oral Daily  . mupirocin ointment  1 application Nasal BID  . Warfarin - Pharmacist Dosing Inpatient   Does not apply q1800    Continuous Infusions:   Active Problems:   FIBRILLATION, ATRIAL   CONGESTIVE HEART FAILURE (CHF)   OBSTRUCTIVE SLEEP APNEA   Hip fracture   Diabetes mellitus   Leukocytosis, unspecified   Elevated blood pressure   Normocytic anemia due to blood loss   Thrombocytopenia, unspecified   COPD (chronic obstructive pulmonary disease) with chronic respiratory failure, 2L home O2    Time spent: 30 min    Peniel Hass, [place] [facility] Pager [phone]. If 7PM-7AM, please contact night-coverage at [email or web address], password [facility] 05/09/2013, 12:08 PM  LOS: 3 days
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
## 2013-05-09 NOTE — Evaluation (Signed)
Occupational Therapy Evaluation Patient Details Name: [name] MRN: [id number] DOB: [birth year] Today's Date: 05/09/2013 Time: [id number] OT Time Calculation (min): 35 min  OT Assessment / Plan / Recommendation Clinical Impression  Pt admitted after fall resulting in L proximal femur fx requiring IM nail.  Pt also had a L clavicle fx 6 weeks ago with rotator cuff injury limiting use of L shoulder.  Pt was moderately dependent in self care PTA and dependent in all aspects of IADL.  She currently requires +2 assist for all mobility and is limited by pain.  Will defer OT to SNF.    OT Assessment  All further OT needs can be met in the next venue of care    Follow Up Recommendations  SNF    Barriers to Discharge      Equipment Recommendations       Recommendations for Other Services    Frequency       Precautions / Restrictions Precautions Precautions: Fall Precaution Comments: L clavicle fx 6 weeks ago with rotator cuff injury of unclear extent Restrictions Weight Bearing Restrictions: Yes LLE Weight Bearing: Non weight bearing   Pertinent Vitals/Pain 10/10 L hip, RN aware    ADL  Eating/Feeding: Independent Where Assessed - Eating/Feeding: Bed level Grooming: Wash/dry hands;Wash/dry face;Denture care;Brushing hair;Set up Where Assessed - Grooming: Supine, head of bed up Upper Body Bathing: Moderate assistance Where Assessed - Upper Body Bathing: Supine, head of bed up;Rolling right and/or left Lower Body Bathing: +1 Total assistance Where Assessed - Lower Body Bathing: Rolling right and/or left Upper Body Dressing: Minimal assistance Where Assessed - Upper Body Dressing: Supine, head of bed up Lower Body Dressing: +1 Total assistance Where Assessed - Lower Body Dressing: Rolling right and/or left Transfers/Ambulation Related to ADLs: mobility limited to rolling, repositioning in bed ADL Comments: Pt is moderately dependent in ADL at baseline.  Sister assists her.    OT Diagnosis: Generalized weakness;Acute pain  OT Problem List: Decreased strength;Decreased range of motion;Decreased activity tolerance;Impaired balance (sitting and/or standing);Decreased knowledge of use of DME or AE;Decreased knowledge of precautions;Obesity;Pain;Impaired UE functional use OT Treatment Interventions:     OT Goals    Visit Information  Last OT Received On: 05/09/13 Assistance Needed: +2    Subjective Data  Subjective: "I need a rehab place near [place]." Patient Stated Goal: SNF for rehab prior to return home.   Prior Functioning     Home Living Lives With: Family Available Help at Discharge: Family;Available 24 hours/day Type of Home: Mobile home Home Access: Ramped entrance Home Layout: One level Bathroom Shower/Tub: Chiropodist: Standard Home Adaptive Equipment: Straight cane (makeshift shower chair) Prior Function Level of Independence: Needs assistance Needs Assistance: Bathing;Dressing;Meal Prep;Light Housekeeping Bath: Moderate Dressing: Moderate Meal Prep: Total Light Housekeeping: Total Able to Take Stairs?: No Driving: Yes Comments: Pt is 02 dependent at home. Communication Communication: No difficulties Dominant Hand: Right         Vision/Perception Vision - History Patient Visual Report: No change from baseline   Cognition  Cognition Arousal/Alertness: Awake/alert Behavior During Therapy: Anxious (painful) Overall Cognitive Status: Within Functional Limits for tasks assessed    Extremity/Trunk Assessment Right Upper Extremity Assessment RUE ROM/Strength/Tone: Deficits RUE ROM/Strength/Tone Deficits: generalized weakness RUE Coordination: WFL - gross/fine motor Left Upper Extremity Assessment LUE ROM/Strength/Tone: Deficits LUE ROM/Strength/Tone Deficits: Shoulder limitation due to recent clavicle fx with rotator cuff injury, WFL elbow to hand LUE Coordination: WFL - fine motor     Mobility Bed  Mobility Bed Mobility: Rolling Right;Rolling Left Rolling Right: 1: +2 Total assist;With rail Rolling Right: Patient Percentage: 30% Rolling Left: 1: +2 Total assist;With rail Rolling Left: Patient Percentage: 30%   Exercise     Balance   End of Session OT - End of Session Activity Tolerance: Patient limited by pain Patient left: in bed  GO     Malka So 05/09/2013, 9:36 AM [phone]

## 2013-05-09 NOTE — Progress Notes (Signed)
Utilization Review Completed.   Mckinley Olheiser, RN, BSN Nurse Case Manager  [phone]  

## 2013-05-10 ENCOUNTER — Ambulatory Visit: Payer: Self-pay | Admitting: General Practice

## 2013-05-10 DIAGNOSIS — S72009A Fracture of unspecified part of neck of unspecified femur, initial encounter for closed fracture: Secondary | ICD-10-CM

## 2013-05-10 LAB — GLUCOSE, CAPILLARY
Glucose-Capillary: 161 mg/dL — ABNORMAL HIGH (ref 70–99)
Glucose-Capillary: 222 mg/dL — ABNORMAL HIGH (ref 70–99)

## 2013-05-10 LAB — CBC
MCH: 27.2 pg (ref 26.0–34.0)
MCHC: 31.9 g/dL (ref 30.0–36.0)
MCV: 85.3 fL (ref 78.0–100.0)
Platelets: 118 10*3/uL — ABNORMAL LOW (ref 150–400)
RDW: 16.3 % — ABNORMAL HIGH (ref 11.5–15.5)
WBC: 13 10*3/uL — ABNORMAL HIGH (ref 4.0–10.5)

## 2013-05-10 LAB — BASIC METABOLIC PANEL
Calcium: 8.8 mg/dL (ref 8.4–10.5)
Creatinine, Ser: 0.78 mg/dL (ref 0.50–1.10)
GFR calc Af Amer: >90 mL/min (ref >90)

## 2013-05-10 LAB — PROTIME-INR: Prothrombin Time: 17.1 seconds — ABNORMAL HIGH (ref 11.6–15.2)

## 2013-05-10 MED ORDER — ENOXAPARIN SODIUM 40 MG/0.4ML ~~LOC~~ SOLN: 40.0000 mg | Freq: Every day | SUBCUTANEOUS | Status: DC

## 2013-05-10 MED ORDER — MAGNESIUM HYDROXIDE 400 MG/5ML PO SUSP
30.0000 mL | Freq: Every day | ORAL | Status: DC | PRN
  Administered 2013-05-11 – 2013-05-12 (×2): 30 mL via ORAL
  Filled 2013-05-10 (×2): qty 30

## 2013-05-10 MED ORDER — ENOXAPARIN SODIUM 150 MG/ML ~~LOC~~ SOLN: 150.0000 mg | Freq: Two times a day (BID) | SUBCUTANEOUS | Status: DC

## 2013-05-10 MED ORDER — DOCUSATE SODIUM 100 MG PO CAPS
100.0000 mg | ORAL_CAPSULE | Freq: Two times a day (BID) | ORAL | Status: DC
  Administered 2013-05-10 – 2013-05-12 (×5): 100 mg via ORAL
  Filled 2013-05-10 (×6): qty 1

## 2013-05-10 MED ORDER — INSULIN ASPART 100 UNIT/ML ~~LOC~~ SOLN
0.0000 [IU] | Freq: Every day | SUBCUTANEOUS | Status: DC
  Administered 2013-05-10: 2 [IU] via SUBCUTANEOUS

## 2013-05-10 MED ORDER — BISACODYL 5 MG PO TBEC: 10.0000 mg | DELAYED_RELEASE_TABLET | Freq: Every day | ORAL | Status: DC | PRN

## 2013-05-10 MED ORDER — WARFARIN SODIUM 5 MG PO TABS
5.0000 mg | ORAL_TABLET | Freq: Once | ORAL | Status: AC
  Administered 2013-05-10: 5 mg via ORAL
  Filled 2013-05-10: qty 1

## 2013-05-10 MED ORDER — DSS 100 MG PO CAPS: 100.0000 mg | ORAL_CAPSULE | Freq: Two times a day (BID) | ORAL | Status: DC

## 2013-05-10 MED ORDER — INSULIN ASPART 100 UNIT/ML ~~LOC~~ SOLN: 0.0000 [IU] | Freq: Every day | SUBCUTANEOUS | Status: DC

## 2013-05-10 MED ORDER — ENOXAPARIN SODIUM 40 MG/0.4ML ~~LOC~~ SOLN
40.0000 mg | Freq: Every day | SUBCUTANEOUS | Status: DC
  Administered 2013-05-11 – 2013-05-12 (×2): 40 mg via SUBCUTANEOUS
  Filled 2013-05-10 (×2): qty 0.4

## 2013-05-10 MED ORDER — POLYETHYLENE GLYCOL 3350 17 G PO PACK
17.0000 g | PACK | Freq: Every day | ORAL | Status: DC | PRN
  Filled 2013-05-10: qty 1

## 2013-05-10 MED ORDER — ENOXAPARIN SODIUM 150 MG/ML ~~LOC~~ SOLN
150.0000 mg | Freq: Two times a day (BID) | SUBCUTANEOUS | Status: DC
  Filled 2013-05-10 (×2): qty 1

## 2013-05-10 MED ORDER — ENOXAPARIN SODIUM 40 MG/0.4ML ~~LOC~~ SOLN
40.0000 mg | Freq: Every day | SUBCUTANEOUS | Status: DC
  Filled 2013-05-10: qty 0.4

## 2013-05-10 MED ORDER — OXYCODONE-ACETAMINOPHEN 10-325 MG PO TABS: 1.0000 | ORAL_TABLET | Freq: Four times a day (QID) | ORAL | Status: DC | PRN

## 2013-05-10 MED ORDER — POLYETHYLENE GLYCOL 3350 17 G PO PACK: 17.0000 g | PACK | Freq: Every day | ORAL | Status: DC | PRN

## 2013-05-10 MED ORDER — MAGNESIUM HYDROXIDE 400 MG/5ML PO SUSP: 30.0000 mL | Freq: Every day | ORAL | Status: DC | PRN

## 2013-05-10 MED ORDER — ACETAMINOPHEN 325 MG PO TABS: 650.0000 mg | ORAL_TABLET | Freq: Four times a day (QID) | ORAL | Status: DC | PRN

## 2013-05-10 MED ORDER — INSULIN ASPART 100 UNIT/ML ~~LOC~~ SOLN: 0.0000 [IU] | Freq: Three times a day (TID) | SUBCUTANEOUS | Status: DC

## 2013-05-10 MED ORDER — INSULIN ASPART 100 UNIT/ML ~~LOC~~ SOLN
0.0000 [IU] | Freq: Three times a day (TID) | SUBCUTANEOUS | Status: DC
  Administered 2013-05-10: 7 [IU] via SUBCUTANEOUS
  Administered 2013-05-11: 4 [IU] via SUBCUTANEOUS
  Administered 2013-05-11: 7 [IU] via SUBCUTANEOUS
  Administered 2013-05-11: 4 [IU] via SUBCUTANEOUS
  Administered 2013-05-12: 3 [IU] via SUBCUTANEOUS
  Administered 2013-05-12: 4 [IU] via SUBCUTANEOUS

## 2013-05-10 MED ORDER — INSULIN GLARGINE 100 UNIT/ML ~~LOC~~ SOLN: 15.0000 [IU] | Freq: Every day | SUBCUTANEOUS | Status: DC

## 2013-05-10 MED ORDER — INSULIN GLARGINE 100 UNIT/ML ~~LOC~~ SOLN
15.0000 [IU] | Freq: Every day | SUBCUTANEOUS | Status: DC
  Administered 2013-05-10 – 2013-05-11 (×2): 15 [IU] via SUBCUTANEOUS
  Filled 2013-05-10 (×3): qty 0.15

## 2013-05-10 NOTE — Progress Notes (Signed)
CSW spoke with patient regarding current bed offers as daughter had indicated a desire for placement at [facility]. This facility has an available bed and also accepts her [employer].  Patient relates that she would prefer the [facility] if possible.  CSW initiated bed search for [facility] and will follow up with patient tomorrow. Per MD, she will be medically stable for d/c tomorrow.  CSW will assist with d/c.  Lorie Phenix. [place], [place]
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
## 2013-05-10 NOTE — Progress Notes (Addendum)
Lt leg wound weeping, sheets and pad wet.  Dressing changed. Will continue to monitor

## 2013-05-10 NOTE — Care Management Note (Signed)
    Page 1 of 1   05/10/2013     4:04:40 PM   CARE MANAGEMENT NOTE 05/10/2013  Patient:  [name], [name]   Account Number:  [id number]  Date Initiated:  05/10/2013  Documentation initiated by:  Llana Aliment  Subjective/Objective Assessment:   67yo female admitted S/P Fall.  Pt. had surgery to repair.     Action/Plan:   progression, discharge planning, and disposition   Anticipated DC Date:  05/11/2013   Anticipated DC Plan:  SKILLED NURSING FACILITY  In-house referral  Clinical Social Worker      DC Planning Services  CM consult      Choice offered to / List presented to:             Status of service:  In process, will continue to follow Medicare Important Message given?   (If response is "NO", the following Medicare IM given date fields will be blank) Date Medicare IM given:   Date Additional Medicare IM given:    Discharge Disposition:    Per UR Regulation:  Reviewed for med. necessity/level of care/duration of stay  If discussed at [place] of Stay Meetings, dates discussed:    Comments:  05/10/13 1600 Noted CM referral for [facility]  PT has recommended SNF at discharge.  Butch Penny, CSW, aware and is working on placement. Llana Aliment, RN, BSN NCM [phone]

## 2013-05-10 NOTE — Progress Notes (Signed)
Orthopedics Progress Note  Subjective: My leg hurts right much.  Objective:  Filed Vitals:   05/10/13 1144  BP:   Pulse:   Temp:   Resp: 18    General: Awake and alert  Musculoskeletal: Staple line intact, some minimal bleeding, no erythema Neurovascularly intact  Lab Results  Component Value Date   WBC 13.0* 05/10/2013   HGB 9.4* 05/10/2013   HCT 29.5* 05/10/2013   MCV 85.3 05/10/2013   PLT 118* 05/10/2013       Component Value Date/Time   NA 140 05/10/2013 0536   K 4.2 05/10/2013 0536   CL 106 05/10/2013 0536   CO2 30 05/10/2013 0536   GLUCOSE 180* 05/10/2013 0536   BUN 21 05/10/2013 0536   CREATININE 0.78 05/10/2013 0536   CALCIUM 8.8 05/10/2013 0536   GFRNONAA 83* 05/10/2013 0536   GFRAA >90 05/10/2013 0536    Lab Results  Component Value Date   INR 1.43 05/10/2013   INR 1.42 05/09/2013   INR 1.83* 05/08/2013    Assessment/Plan: POD #2 s/p Procedure(s): INTRAMEDULLARY (IM) NAIL FEMORAL--LONG(BIOMET SYSTEM) and Zimmer Cables Pt, OT, D/C planning, will need SNF NWB on left  Remo Lipps R. Veverly Fells, MD 05/10/2013 1:59 PM

## 2013-05-10 NOTE — Progress Notes (Addendum)
Pharmacy: LMWH for Afib bridge therapy Ms. Badders received LMWH 40 mg at 1129 am.  Pharmacy consulted for full dose LMWH for afib for bridge therapy.   Dr. Sheran Fava spoke with pharmacist, Nicole Cella and confirmed she does want full dose LMWH and she is aware of the PLTC of 118.  Ms. Cutbirth had her L femur repaired with an IM nail on Sunday 05/08/13.  Her wt is 147 kg and her creat cl is > 30 ml/min. Plan: LMWH 150 mg sq q12h to bridge until INR > 2. Pt to be discharge home tomorrow with LMWH and coumadin. Thanks Eudelia Bunch, Pharm.D. [phone] 05/10/2013 6:05 PM  Addn: spoke to Dr. Sheran Fava, Plan to change back to LMWH 40 q24 as ordered by Dr. Veverly Fells for VTE px Eudelia Bunch, Pharm.D. [phone] 05/10/2013 6:36 PM

## 2013-05-10 NOTE — Progress Notes (Signed)
Pt stated they did not need PRN for BM at this time. Will continue to monitor

## 2013-05-10 NOTE — Progress Notes (Signed)
Physical Therapy Treatment Patient Details Name: [name] MRN: [id number] DOB: 1946-06-19 Today's Date: 05/10/2013 Time: [id number] PT Time Calculation (min): 44 min  PT Assessment / Plan / Recommendation Comments on Treatment Session  Pt is a morbidly obese 67 yo female s/p IM nail in L femur. Pt with improved participation in therapy today, initiate L LE movement. Pt unable to use UE's adequately to assist with supine to sit transfers. Pt is +3 total assist for sit to stand as she is unable to lift leg enough to comply with NWB precautions. Pt would benefit from SNF to improve mobility status and achieve modI for ADLs prior to return home.    Follow Up Recommendations  SNF     Does the patient have the potential to tolerate intense rehabilitation     Barriers to Discharge        Equipment Recommendations  Rolling walker with 5" wheels    Recommendations for Other Services    Frequency Min 3X/week   Plan Discharge plan remains appropriate    Precautions / Restrictions Precautions Precautions: Fall Precaution Comments: L clavicle fx 6 mo ago per pt report Restrictions Weight Bearing Restrictions: Yes LLE Weight Bearing: Non weight bearing   Pertinent Vitals/Pain Pt reports 8/10 L hip pain    Mobility  Bed Mobility Bed Mobility: Rolling Right;Rolling Left Rolling Right: 1: +2 Total assist;With rail Rolling Right: Patient Percentage: 30% Rolling Left: 1: +2 Total assist;With rail Rolling Left: Patient Percentage: 30%, assisting with bedpan removal and linen change Supine to Sit: 1: +2 Total assist;With rails;HOB elevated (pt able to initiate leg movement, +3) Supine to Sit: Patient Percentage: 0% Sit to Supine: 1: +2 Total assist;With rail;HOB flat (+3) Sit to Supine: Patient Percentage: 0% Scooting to HOB: 1: +2 Total assist (in trendelenburg) Scooting to [facility]: Patient Percentage: 0% Details for Bed Mobility Assistance: Pt less painful today, able to initiate leg  movement but did not use UEs to assist with sup to sit Transfers Transfers: Sit to Stand;Stand to Sit Sit to Stand: From bed;With upper extremity assist (+3, lift leg) Stand to Sit: To bed (+3 total, with UE, 1 person lifting leg) Details for Transfer Assistance: Pt is unable to use arms adequately to push up on walker. Pt unable to lift leg, required 1 person for L LE management for NWB precautions and 2 people for lifting with pad and gait belt. Pt required max v/c's to lift head and tuck hips under. Pt tolerated standing ~30 seconds    Exercises General Exercises - Lower Extremity Ankle Circles/Pumps: AROM;20 reps;Both;Supine Quad Sets: AROM;Both;10 reps;Supine Gluteal Sets: AROM;Both;10 reps;Supine Heel Slides: AAROM;Both;10 reps;Supine Hip ABduction/ADduction: AAROM;Both;10 reps;Supine   PT Diagnosis:    PT Problem List:   PT Treatment Interventions:     PT Goals Acute Rehab PT Goals PT Goal Formulation: With patient Time For Goal Achievement: 05/23/13 Potential to Achieve Goals: Good PT Goal: Supine/Side to Sit - Progress: Progressing toward goal PT Goal: Sit at Edge Of Bed - Progress: Progressing toward goal PT Goal: Sit to Supine/Side - Progress: Progressing toward goal PT Goal: Sit to Stand - Progress: Progressing toward goal PT Goal: Perform Home Exercise Program - Progress: Progressing toward goal  Visit Information  Last PT Received On: 05/10/13 Assistance Needed: +3 or more    Subjective Data  Subjective: Pt recieved in bed, agreeable to PT   Cognition  Cognition Arousal/Alertness: Awake/alert Behavior During Therapy: Anxious Overall Cognitive Status: Within Functional Limits for tasks assessed  Balance  Balance Balance Assessed: Yes Static Sitting Balance Static Sitting - Balance Support: Bilateral upper extremity supported (R foot supported) Static Sitting - Level of Assistance: 5: Stand by assistance Static Sitting - Comment/# of Minutes: 5 Dynamic  Sitting Balance Dynamic Sitting - Balance Support: During functional activity Dynamic Sitting - Level of Assistance: 5: Stand by assistance Dynamic Sitting Balance - Compensations: 2 mins Dynamic Sitting - Balance Activities: Lateral lean/weight shifting Dynamic Sitting - Comments: Pt able to support self when leaning each direction on UE  End of Session PT - End of Session Equipment Utilized During Treatment: Gait belt;Oxygen Activity Tolerance: Patient limited by pain Patient left: in bed;with call bell/phone within reach Nurse Communication: Weight bearing status;Mobility status   GP   05/10/2013, 3:53 PM Donnald Garre, Student Physical Therapist Office #: [phone]  Agree with above assessment.  Kittie Plater, PT, DPT Pager #: [phone] Office #: [phone]

## 2013-05-10 NOTE — Progress Notes (Signed)
Pt pain has been controlled by PO meds. Lt hip dressing change d/t drainage. Pt states on BM since Saturday. Pt started on colace and PRNs added. Pt has no complaints at this time.

## 2013-05-10 NOTE — Progress Notes (Signed)
Physical Therapy Treatment Patient Details Name: [name] MRN: [id number] DOB: 1946/09/14 Today's Date: 05/10/2013 Time: [id number] PT Time Calculation (min): 44 min  PT Assessment / Plan / Recommendation Comments on Treatment Session  Pt is a morbidly obese 67 yo female s/p IM nail in L femur. Pt with improved participation in therapy today, initiate L LE movement. Pt unable to use UE's adequately to assist with supine to sit transfers. Pt is +3 total assist for sit to stand as she is unable to lift leg enough to comply with NWB precautions. Pt would benefit from SNF to improve mobility status and achieve modI for ADLs prior to return home.    Follow Up Recommendations  SNF     Does the patient have the potential to tolerate intense rehabilitation     Barriers to Discharge        Equipment Recommendations  Rolling walker with 5" wheels    Recommendations for Other Services    Frequency Min 3X/week   Plan Discharge plan remains appropriate    Precautions / Restrictions Precautions Precautions: Fall Precaution Comments: L clavicle fx 6 mo ago per pt report Restrictions Weight Bearing Restrictions: Yes LLE Weight Bearing: Non weight bearing   Pertinent Vitals/Pain Pt reports 8/10 L hip pain    Mobility  Bed Mobility Bed Mobility: Rolling Right;Rolling Left Rolling Right: 1: +2 Total assist;With rail Rolling Right: Patient Percentage: 30% Rolling Left: 1: +2 Total assist;With rail Rolling Left: Patient Percentage: 30%, assisting with bedpan removal and linen change Supine to Sit: 1: +2 Total assist;With rails;HOB elevated (pt able to initiate leg movement, +3) Supine to Sit: Patient Percentage: 0% Sit to Supine: 1: +2 Total assist;With rail;HOB flat (+3) Sit to Supine: Patient Percentage: 0% Scooting to HOB: 1: +2 Total assist (in trendelenburg) Scooting to [facility]: Patient Percentage: 0% Details for Bed Mobility Assistance: Pt less painful today, able to initiate leg  movement but did not use UEs to assist with sup to sit Transfers Transfers: Sit to Stand;Stand to Sit Sit to Stand: From bed;With upper extremity assist (+3, lift leg) Stand to Sit: To bed (+3 total, with UE, 1 person lifting leg) Details for Transfer Assistance: Pt is unable to use arms adequately to push up on walker. Pt unable to lift leg, required 1 person for L LE management for NWB precautions and 2 people for lifting with pad and gait belt. Pt required max v/c's to lift head and tuck hips under. Pt tolerated standing ~30 seconds    Exercises General Exercises - Lower Extremity Ankle Circles/Pumps: AROM;20 reps;Both;Supine Quad Sets: AROM;Both;10 reps;Supine Gluteal Sets: AROM;Both;10 reps;Supine Heel Slides: AAROM;Both;10 reps;Supine Hip ABduction/ADduction: AAROM;Both;10 reps;Supine   PT Diagnosis:    PT Problem List:   PT Treatment Interventions:     PT Goals Acute Rehab PT Goals PT Goal Formulation: With patient Time For Goal Achievement: 05/23/13 Potential to Achieve Goals: Good PT Goal: Supine/Side to Sit - Progress: Progressing toward goal PT Goal: Sit at Edge Of Bed - Progress: Progressing toward goal PT Goal: Sit to Supine/Side - Progress: Progressing toward goal PT Goal: Sit to Stand - Progress: Progressing toward goal PT Goal: Perform Home Exercise Program - Progress: Progressing toward goal  Visit Information  Last PT Received On: 05/10/13 Assistance Needed: +3 or more    Subjective Data  Subjective: Pt recieved in bed, agreeable to PT   Cognition  Cognition Arousal/Alertness: Awake/alert Behavior During Therapy: Anxious Overall Cognitive Status: Within Functional Limits for tasks assessed  Balance  Balance Balance Assessed: Yes Static Sitting Balance Static Sitting - Balance Support: Bilateral upper extremity supported (R foot supported) Static Sitting - Level of Assistance: 5: Stand by assistance Static Sitting - Comment/# of Minutes: 5 Dynamic  Sitting Balance Dynamic Sitting - Balance Support: During functional activity Dynamic Sitting - Level of Assistance: 5: Stand by assistance Dynamic Sitting Balance - Compensations: 2 mins Dynamic Sitting - Balance Activities: Lateral lean/weight shifting Dynamic Sitting - Comments: Pt able to support self when leaning each direction on UE  End of Session PT - End of Session Equipment Utilized During Treatment: Gait belt;Oxygen Activity Tolerance: Patient limited by pain Patient left: in bed;with call bell/phone within reach Nurse Communication: Weight bearing status;Mobility status   GP   05/10/2013, 3:53 PM Donnald Garre, Student Physical Therapist Office #: [phone]

## 2013-05-10 NOTE — Progress Notes (Signed)
ANTICOAGULATION CONSULT NOTE - Follow up [place] for Coumadin Indication: atrial fibrillation  Allergies  Allergen Reactions  . Codeine     Patient Measurements: Height: 5\' 6"  (167.6 cm) Weight: 324 lb 1.6 oz (147.011 kg) (bed scale) IBW/kg (Calculated) : 59.3  Vital Signs: Temp: 98 F (36.7 C) (06/10 0634) Temp src: Oral (06/10 0634) BP: 152/64 mmHg (06/10 0634) Pulse Rate: 70 (06/10 0800)  Labs:  Recent Labs  05/08/13 0508  05/08/13 2017 05/09/13 0525 05/10/13 0536  HGB  --   < > 11.0* 9.8* 9.4*  HCT  --   < > 33.2* 30.2* 29.5*  PLT  --   --  138* 134* 118*  LABPROT 20.5*  --   --  17.0* 17.1*  INR 1.83*  --   --  1.42 1.43  CREATININE 1.21*  --  0.93 0.89 0.78  < > = values in this interval not displayed.  Estimated Creatinine Clearance: 98.9 ml/min (by C-G formula based on Cr of 0.78).   Medical History: Past Medical History  Diagnosis Date  . Anemia   . Arthritis   . Asthma   . Cataract     had surgery  . CHF (congestive heart failure)   . Diabetes mellitus   . Hyperlipidemia   . Hypertension   . CAD (coronary artery disease)     LAD 60% proximal stenosis mid and distal 60% stenosis 2009.  . Stroke     2 mini  . Oxygen dependent     2 liter per nasal cannula  . Gait instability     uses cane  . GERD (gastroesophageal reflux disease)   . Obesity   . Sleep apnea, obstructive 2008  . Atrial fibrillation   . COPD (chronic obstructive pulmonary disease)     Medications:  Prescriptions prior to admission  Medication Sig Dispense Refill  . alendronate (FOSAMAX) 70 MG tablet Take 1 tablet (70 mg total) by mouth every 7 (seven) days.  4 tablet  2  . diltiazem (CARDIZEM CD) 180 MG 24 hr capsule Take 180 mg by mouth daily.      . fish oil-omega-3 fatty acids 1000 MG capsule Take 2 g by mouth daily.        . furosemide (LASIX) 20 MG tablet Take 1 tablet by mouth 3 (three) times daily.       Marland Kitchen ibuprofen (ADVIL,MOTRIN) 200 MG tablet  Take 200 mg by mouth at bedtime.      Marland Kitchen LEVEMIR FLEXPEN 100 UNIT/ML injection Inject 43 Units into the skin Twice daily. After breadfast and at 9pm      . lisinopril (PRINIVIL,ZESTRIL) 20 MG tablet Take 20 mg by mouth daily.       . metFORMIN (GLUCOPHAGE) 500 MG tablet Take 500 mg by mouth 2 (two) times daily with a meal.        . potassium chloride SA (K-DUR,KLOR-CON) 20 MEQ tablet Take 20 mEq by mouth.      . rosuvastatin (CRESTOR) 10 MG tablet Take 10 mg by mouth at bedtime.      Marland Kitchen warfarin (COUMADIN) 5 MG tablet Take 5 mg by mouth See admin instructions. Take every evening except Monday.  No dose on Monday.      Marland Kitchen oxyCODONE-acetaminophen (PERCOCET) 10-325 MG per tablet Take 1 tablet by mouth every 8 (eight) hours as needed for pain.  30 tablet  0    Assessment: 67 y/o female patient s/p ortho procedure receiving coumadin for h/o  afib. Coumadin resumed yesterday, INR subtherapeutic. May experience some coumadin resistance as received vit k on admit to reverse. Lovenox until INR >2 Goal of Therapy:  INR 2-3   Plan:  Repeat Coumadin 5mg  today and f/u daily protime.  Davonna Belling, PharmD, BCPS Pager [phone] 05/10/2013,11:00 AM

## 2013-05-10 NOTE — Progress Notes (Signed)
[facility] PROGRESS NOTE  [name] [id number] DOB: [birth year] DOA: 05/06/2013 PCP: Redge Gainer, MD  Assessment/Plan  Left displaced subtrochanteric femur fracture status post open reduction and internal fixation on June 8 by Dr. Esmond Plants.  Continue nonweightbearing left lower extremity per orthopedics.  She should continue physical and occupational therapy.  Pain has been controlled on oral pain medications   Atrial fibrillation: Paroxysmal atrial fibrillation, currently in atrial fibrillation with rate in the 80s.  She continued diltiazem. Her INR was reversed with 2 doses of vitamin K. Her warfarin was restarted postoperatively and her current INR is 1.43.  She should have a repeat INR done in 2 days, results to be sent to her primary care doctor who manages her warfarin.  Diastolic heart failure, acute on chronic exacerbation of diastolic heart failure at admission which resolved with one dose of IV Lasix.  She resumed her home Lasix dose postoperatively and has appeared compensated.  Elevated blood pressures, possible underlying hypertension but difficult to establish in the setting of pain postoperatively. Continue lisinopril and followup blood pressure one to 2 weeks.    Diabetes mellitus, fingersticks stable. A1c 5.9.  Previously had been on levemir 43 units twice daily, but this may be too much as her hemoglobin A1c is less than 6, particularly as she was not on any Shone Leventhal-acting insulin.  Fingersticks moderately elevated on sliding scale insulin - Continue high-dose sliding scale insulin.  - Add lantus 15 units each bedtime  -  At each bedtime insulin  Obstructive sleep apnea, stable. Continue nightly CPAP  COPD on 2 L home oxygen for chronic respiratory failure, remained stable. Morbid obesity, to be addressed as an outpatient  Mild leukocytosis likely secondary to stress reaction for fracture and surgery  Normocytic anemia, likely due to blood loss from  procedure.  Repeat CBC primary care doctor in a few weeks if hemoglobin is stable.  Mild thrombocytopenia, likely due to blood loss during surgery.  Repeat CBC primary care doctor in one to 2 weeks  Diet: Diabetic diet Access:  PIV IVF:  Off Proph:  Lovenox for DVT prophylaxis. Awaiting therapeutic INR  Code Status: Full code Family Communication: Spoke with patient alone Disposition Plan: To SNF tomorrow  Consultants:  Dr. Veverly Fells, Orthopedic surgery  Procedures:  X-ray chest  X-ray left elbow 2 views  X-ray hip left  X-ray left shoulder  ORIF left hip June 8th  Antibiotics:  None   HPI/Subjective:  The patient states that she feels much better today. She continues to have some pain in the left hip but it is improving. She denies chest pain, shortness of breath, nausea, vomiting, diarrhea. She has not had a bowel movement in several days. She is voiding well.    Objective: Filed Vitals:   05/10/13 1134 05/10/13 1144 05/10/13 1500 05/10/13 1545  BP: 143/84  144/70   Pulse: 97  94   Temp:   98 F (36.7 C)   TempSrc:   Oral   Resp: 18 18 18 18   Height:      Weight:      SpO2: 99% 99% 99% 99%    Intake/Output Summary (Last 24 hours) at 05/10/13 1607 Last data filed at 05/10/13 1500  Gross per 24 hour  Intake    720 ml  Output   1900 ml  Net  -1180 ml   Filed Weights   05/08/13 0451 05/09/13 0442 05/10/13 0634  Weight: 146.058 kg (322 lb) 147.5 kg (325 lb 2.9  oz) 147.011 kg (324 lb 1.6 oz)    Exam:  GEN: Obese Caucasian female, no acute distress, awake, alert  HEENT: MMM, Nasal cannula in place  CV: IRRR, no mrg  PULM: Clear to auscultation anteriorly. No increased WOB  MSK: She has pain in the left lateral hip, 52mm of dark red blood on middle bandage postoperatively. Warm extremities, less than 2 second cap refill bilaterally, She is able to wiggle her toes on both legs.     Data Reviewed: Basic Metabolic Panel:  Recent Labs Lab 05/06/13 2124  05/07/13 [id number] 05/08/13 0508 05/08/13 1617 05/08/13 2017 05/09/13 0525 05/10/13 0536  NA 140 140 141 141  --  140 140  K 4.2 5.3* 4.5 5.0  --  4.1 4.2  CL 105 107 107  --   --  106 106  CO2 27 26 29   --   --  25 30  GLUCOSE 153* 166* 135* 148*  --  149* 180*  BUN 20 20 25*  --   --  25* 21  CREATININE 0.88 0.83 1.21*  --  0.93 0.89 0.78  CALCIUM 8.8 8.4 8.7  --   --  8.6 8.8   Liver Function Tests:  Recent Labs Lab 05/06/13 2124  AST 14  ALT 10  ALKPHOS 144*  BILITOT 0.7  PROT 6.7  ALBUMIN 3.3*   No results found for this basename: LIPASE, AMYLASE,  in the last 168 hours No results found for this basename: AMMONIA,  in the last 168 hours CBC:  Recent Labs Lab 05/06/13 2124 05/07/13 0635 05/08/13 1617 05/08/13 2017 05/09/13 0525 05/10/13 0536  WBC 15.1* 10.6*  --  16.5* 11.7* 13.0*  NEUTROABS 13.7*  --   --   --   --   --   HGB 8.4* 8.0* 8.8* 11.0* 9.8* 9.4*  HCT 27.6* 26.2* 26.0* 33.2* 30.2* 29.5*  MCV 86.0 87.0  --  84.7 83.7 85.3  PLT 154 140*  --  138* 134* 118*   Cardiac Enzymes: No results found for this basename: CKTOTAL, CKMB, CKMBINDEX, TROPONINI,  in the last 168 hours BNP (last 3 results)  Recent Labs  05/07/13 1302  PROBNP 3126.0*   CBG:  Recent Labs Lab 05/09/13 1220 05/09/13 1644 05/09/13 1958 05/10/13 0631 05/10/13 1122  GLUCAP 167* 171* 206* 165* 161*    Recent Results (from the past 240 hour(s))  URINE CULTURE     Status: None   Collection Time    05/06/13  9:51 PM      Result Value Range Status   Specimen Description URINE, CLEAN CATCH   Final   Special Requests NONE   Final   Culture  Setup Time 05/07/2013 19:42   Final   Colony Count NO GROWTH   Final   Culture NO GROWTH   Final   Report Status 05/08/2013 FINAL   Final  SURGICAL PCR SCREEN     Status: Abnormal   Collection Time    05/07/13  8:28 PM      Result Value Range Status   MRSA, PCR NEGATIVE  NEGATIVE Final   Staphylococcus aureus POSITIVE (*) NEGATIVE Final    Comment:            The Xpert SA Assay (FDA     approved for NASAL specimens     in patients over 97 years of age),     is one component of     a comprehensive surveillance     program.  Test performance has     been validated by [facility] for patients greater     than or equal to 34 year old.     It is not intended     to diagnose infection nor to     guide or monitor treatment.     Studies: Dg Hip Operative Left  05/08/2013   *RADIOLOGY REPORT*  Clinical Data: ORIF of left hip fracture.  OPERATIVE LEFT HIP  Comparison: None.  Findings: Femoral nail extending across the neck shows normal alignment.  Intramedullary rod is also present demonstrating normal alignment.  Alignment at the level of the proximal femoral fracture is near anatomic.  IMPRESSION: Operative imaging showing near anatomic alignment after ORIF of the proximal left femur fracture.   Original Report Authenticated By: Aletta Edouard, M.D.   Dg Pelvis Portable  05/08/2013   *RADIOLOGY REPORT*  Clinical Data: Status post ORIF of proximal left femur.  PORTABLE PELVIS  Comparison: Intraoperative images obtained earlier today.  Findings: Frontal projection of the pelvis and additional imaging of the proximal left femur demonstrate near anatomic alignment following ORIF of the proximal femoral fracture.  Femoral neck nail and intramedullary rod appears appropriately aligned.  IMPRESSION: Near anatomic alignment of the proximal left femur after ORIF.   Original Report Authenticated By: Aletta Edouard, M.D.    Scheduled Meds: . antiseptic oral rinse  15 mL Mouth Rinse BID  . atorvastatin  20 mg Oral q1800  . Chlorhexidine Gluconate Cloth  6 each Topical Daily  . diltiazem  180 mg Oral Daily  . docusate sodium  100 mg Oral BID  . enoxaparin (LOVENOX) injection  40 mg Subcutaneous Q24H  . furosemide  20 mg Oral TID WC  . insulin aspart  0-20 Units Subcutaneous TID WC  . insulin aspart  0-5 Units Subcutaneous QHS  .  insulin glargine  15 Units Subcutaneous QHS  . lisinopril  20 mg Oral Daily  . mupirocin ointment  1 application Nasal BID  . warfarin  5 mg Oral ONCE-1800  . Warfarin - Pharmacist Dosing Inpatient   Does not apply q1800   Continuous Infusions:   Active Problems:   FIBRILLATION, ATRIAL   CONGESTIVE HEART FAILURE (CHF)   OBSTRUCTIVE SLEEP APNEA   Hip fracture   Diabetes mellitus   Leukocytosis, unspecified   Elevated blood pressure   Normocytic anemia due to blood loss   Thrombocytopenia, unspecified   COPD (chronic obstructive pulmonary disease) with chronic respiratory failure, 2L home O2    Time spent: 30 min    Janely Gullickson, [place] [facility] Pager [phone]. If 7PM-7AM, please contact night-coverage at [email or web address], password [facility] 05/10/2013, 4:07 PM  LOS: 4 days

## 2013-05-11 LAB — CBC
HCT: 27.7 % — ABNORMAL LOW (ref 36.0–46.0)
MCV: 87.1 fL (ref 78.0–100.0)
Platelets: 115 10*3/uL — ABNORMAL LOW (ref 150–400)
RBC: 3.18 MIL/uL — ABNORMAL LOW (ref 3.87–5.11)
WBC: 11.4 10*3/uL — ABNORMAL HIGH (ref 4.0–10.5)

## 2013-05-11 LAB — GLUCOSE, CAPILLARY
Glucose-Capillary: 167 mg/dL — ABNORMAL HIGH (ref 70–99)
Glucose-Capillary: 228 mg/dL — ABNORMAL HIGH (ref 70–99)
Glucose-Capillary: 173 mg/dL — ABNORMAL HIGH (ref 70–99)

## 2013-05-11 LAB — PROTIME-INR: INR: 1.59 — ABNORMAL HIGH (ref 0.00–1.49)

## 2013-05-11 MED ORDER — WARFARIN SODIUM 5 MG PO TABS
5.0000 mg | ORAL_TABLET | Freq: Once | ORAL | Status: AC
  Administered 2013-05-11: 5 mg via ORAL
  Filled 2013-05-11: qty 1

## 2013-05-11 MED ORDER — OXYCODONE-ACETAMINOPHEN 10-325 MG PO TABS: 1.0000 | ORAL_TABLET | Freq: Four times a day (QID) | ORAL | Status: DC | PRN

## 2013-05-11 MED ORDER — ENOXAPARIN SODIUM 150 MG/ML ~~LOC~~ SOLN: 150.0000 mg | Freq: Two times a day (BID) | SUBCUTANEOUS | Status: DC

## 2013-05-11 MED ORDER — ENOXAPARIN SODIUM 150 MG/ML ~~LOC~~ SOLN
150.0000 mg | Freq: Once | SUBCUTANEOUS | Status: AC
  Administered 2013-05-11: 150 mg via SUBCUTANEOUS
  Filled 2013-05-11: qty 1

## 2013-05-11 NOTE — Progress Notes (Addendum)
Dressing 2x appear  Dry old drainage. Uppermost dressing appear dry with mod drainage changed 6-11. Will continue to monitor

## 2013-05-11 NOTE — Progress Notes (Signed)
Patient received and accepted a bed offer from the [facility]. CSW was awaiting [employer] authorization and this was received today after 4:30. CSW spoke with Bard Herbert, Social Worker- it is too late today for patient to be admitted and they can admit in the a.m. Patient cannot safely go home.  Nursing and patient are aware; CSW notified Dr. Allyson Sabal and will plan d/c to SNF in the a.m. Lorie Phenix. [place], [place]
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
## 2013-05-11 NOTE — Progress Notes (Signed)
Orthopedics Progress Note  Subjective: I am feeling better.  I sat up today for two hours.  Objective:  Filed Vitals:   05/11/13 1926  BP:   Pulse:   Temp:   Resp: 18    General: Awake and alert  Musculoskeletal: Left hip wounds clean and no evidence for infection. Neurovascularly intact  Lab Results  Component Value Date   WBC 11.4* 05/11/2013   HGB 8.8* 05/11/2013   HCT 27.7* 05/11/2013   MCV 87.1 05/11/2013   PLT 115* 05/11/2013       Component Value Date/Time   NA 140 05/10/2013 0536   K 4.2 05/10/2013 0536   CL 106 05/10/2013 0536   CO2 30 05/10/2013 0536   GLUCOSE 180* 05/10/2013 0536   BUN 21 05/10/2013 0536   CREATININE 0.78 05/10/2013 0536   CALCIUM 8.8 05/10/2013 0536   GFRNONAA 83* 05/10/2013 0536   GFRAA >90 05/10/2013 0536    Lab Results  Component Value Date   INR 1.59* 05/11/2013   INR 1.43 05/10/2013   INR 1.42 05/09/2013    Assessment/Plan: POD #3 s/p Procedure(s): INTRAMEDULLARY (IM) NAIL FEMORAL--LONG(BIOMET SYSTEM) and Zimmer Cables Stable from ortho standpoint. DVT prophylaxis, PT, oT  Understand plan for inpatient rehab at [facility] tomorrow Follow up with me in two weeks. [place]. Veverly Fells, MD 05/11/2013 7:58 PM

## 2013-05-11 NOTE — Progress Notes (Signed)
Pt setting on side of bed. 2x person to return Pt to lying on bed.

## 2013-05-11 NOTE — Progress Notes (Signed)
SPoke with Genie at [facility] rehab , regarding questions for qualifications

## 2013-05-11 NOTE — Progress Notes (Signed)
Physical Therapy Treatment Patient Details Name: [name] MRN: [id number] DOB: October 19, 1946 Today's Date: 05/11/2013 Time: [id number] PT Time Calculation (min): 34 min  PT Assessment / Plan / Recommendation Comments on Treatment Session  Pt mobility con't to be limited by morbid obesity and decreased strength. Pt with decreased pain this date and was able to tolerate LE ther ex, sitting EOB and standing better this date. Pt con't to progress towards all goals. Pt remains a canidate for SNF upon d/c to address mentioned deficits and achieve maximal functional recovery for safe transition home.    Follow Up Recommendations  SNF     Does the patient have the potential to tolerate intense rehabilitation     Barriers to Discharge        Equipment Recommendations  Rolling walker with 5" wheels    Recommendations for Other Services    Frequency Min 4X/week   Plan Discharge plan needs to be updated    Precautions / Restrictions Precautions Precautions: Fall Restrictions LLE Weight Bearing: Non weight bearing   Pertinent Vitals/Pain Pt reports pain with mvmt in L hip but is now able to tolerate sitting EOB with L LE in knee flexion    Mobility  Bed Mobility Bed Mobility: Supine to Sit Supine to Sit: 1: +2 Total assist;With rails;HOB elevated Supine to Sit: Patient Percentage: 30% Details for Bed Mobility Assistance: pt requires assist for LE managment and trunk elevation. pt with limited ability to assist with UE Transfers Transfers: Sit to Stand;Stand to Sit Sit to Stand: From bed;With upper extremity assist Stand to Sit: 1: +2 Total assist;With upper extremity assist (+1 to hold L LE to maintain NWB) Details for Transfer Assistance: pt with improved standing tolerance to 30 sec this date but remains unable to maintain L LE NWB due to decreased strength to hold LE up    Exercises General Exercises - Lower Extremity Ankle Circles/Pumps: AROM;20 reps;Both;Supine Quad Sets:  AROM;Both;Supine;20 reps Gluteal Sets: AROM;Both;10 reps;Supine Long Arc Quad: AROM;Both;20 reps;Seated Heel Slides: AAROM;Both;Supine;20 reps Hip ABduction/ADduction: AAROM;Both;10 reps;Supine   PT Diagnosis:    PT Problem List:   PT Treatment Interventions:     PT Goals Acute Rehab PT Goals PT Goal: Supine/Side to Sit - Progress: Progressing toward goal PT Goal: Sit at Edge Of Bed - Progress: Progressing toward goal PT Goal: Sit to Stand - Progress: Progressing toward goal PT Goal: Perform Home Exercise Program - Progress: Progressing toward goal  Visit Information  Last PT Received On: 05/11/13 Assistance Needed: +3 or more    Subjective Data  Subjective: Pt received supine in bed, agreeable to PT.   Cognition  Cognition Arousal/Alertness: Awake/alert Behavior During Therapy: WFL for tasks assessed/performed Overall Cognitive Status: Within Functional Limits for tasks assessed    Balance  Static Sitting Balance Static Sitting - Balance Support: Bilateral upper extremity supported Static Sitting - Level of Assistance: 5: Stand by assistance Static Sitting - Comment/# of Minutes: 15 Dynamic Sitting Balance Dynamic Sitting - Balance Support: During functional activity Dynamic Sitting - Level of Assistance: 5: Stand by assistance Dynamic Sitting Balance - Compensations: 5 Dynamic Sitting - Comments: completed bilat LE LAQ and ankle pumps  End of Session PT - End of Session Equipment Utilized During Treatment: Gait belt;Oxygen Activity Tolerance: Patient limited by pain Patient left: sitting EOB ;with call bell/phone within reach, bed alarm on Nurse Communication: Weight bearing status;Mobility status   GP     Jaylin Roundy, [place] Neu 05/11/2013, 4:57 PM  Kittie Plater, PT,  DPT Pager #: [phone] Office #: [phone]

## 2013-05-11 NOTE — Progress Notes (Signed)
ANTICOAGULATION CONSULT NOTE - Follow up [facility] for Coumadin Indication: atrial fibrillation  Allergies  Allergen Reactions  . Codeine     Patient Measurements: Height: 5\' 6"  (167.6 cm) Weight: 323 lb 13.7 oz (146.9 kg) IBW/kg (Calculated) : 59.3  Vital Signs: Temp: 98 F (36.7 C) (06/11 1105) Temp src: Oral (06/11 1105) BP: 133/57 mmHg (06/11 1105) Pulse Rate: 75 (06/11 1105)  Labs:  Recent Labs  05/08/13 2017 05/09/13 0525 05/10/13 0536 05/11/13 0400  HGB 11.0* 9.8* 9.4* 8.8*  HCT 33.2* 30.2* 29.5* 27.7*  PLT 138* 134* 118* 115*  LABPROT  --  17.0* 17.1* 18.5*  INR  --  1.42 1.43 1.59*  CREATININE 0.93 0.89 0.78  --     Estimated Creatinine Clearance: 98.8 ml/min (by C-G formula based on Cr of 0.78).   Medical History: Past Medical History  Diagnosis Date  . Anemia   . Arthritis   . Asthma   . Cataract     had surgery  . CHF (congestive heart failure)   . Diabetes mellitus   . Hyperlipidemia   . Hypertension   . CAD (coronary artery disease)     LAD 60% proximal stenosis mid and distal 60% stenosis 2009.  . Stroke     2 mini  . Oxygen dependent     2 liter per nasal cannula  . Gait instability     uses cane  . GERD (gastroesophageal reflux disease)   . Obesity   . Sleep apnea, obstructive 2008  . Atrial fibrillation   . COPD (chronic obstructive pulmonary disease)     Medications:  Prescriptions prior to admission  Medication Sig Dispense Refill  . alendronate (FOSAMAX) 70 MG tablet Take 1 tablet (70 mg total) by mouth every 7 (seven) days.  4 tablet  2  . diltiazem (CARDIZEM CD) 180 MG 24 hr capsule Take 180 mg by mouth daily.      . fish oil-omega-3 fatty acids 1000 MG capsule Take 2 g by mouth daily.        . furosemide (LASIX) 20 MG tablet Take 1 tablet by mouth 3 (three) times daily.       Marland Kitchen lisinopril (PRINIVIL,ZESTRIL) 20 MG tablet Take 20 mg by mouth daily.       . metFORMIN (GLUCOPHAGE) 500 MG tablet Take 500 mg  by mouth 2 (two) times daily with a meal.        . rosuvastatin (CRESTOR) 10 MG tablet Take 10 mg by mouth at bedtime.      Marland Kitchen warfarin (COUMADIN) 5 MG tablet Take 5 mg by mouth See admin instructions. Take every evening except Monday.  No dose on Monday.      . [DISCONTINUED] ibuprofen (ADVIL,MOTRIN) 200 MG tablet Take 200 mg by mouth at bedtime.      . [DISCONTINUED] LEVEMIR FLEXPEN 100 UNIT/ML injection Inject 43 Units into the skin Twice daily. After breadfast and at 9pm      . [DISCONTINUED] potassium chloride SA (K-DUR,KLOR-CON) 20 MEQ tablet Take 20 mEq by mouth.      . [DISCONTINUED] oxyCODONE-acetaminophen (PERCOCET) 10-325 MG per tablet Take 1 tablet by mouth every 8 (eight) hours as needed for pain.  30 tablet  0    Assessment: 67 y/o female patient s/p ortho procedure receiving coumadin for h/o afib. Coumadin resumed 6/8, INR subtherapeutic and rising to 1.59. May experience some coumadin resistance as received vit k on admit to reverse. Lovenox until INR >2 Goal of  Therapy:  INR 2-3   Plan:  Repeat Coumadin 5mg  today and f/u daily protime.  Heide Guile, PharmD, BCPS Clinical Pharmacist Pager [phone]  05/11/2013,2:31 PM

## 2013-05-12 ENCOUNTER — Other Ambulatory Visit: Payer: Self-pay | Admitting: *Deleted

## 2013-05-12 ENCOUNTER — Inpatient Hospital Stay
Admission: RE | Admit: 2013-05-12 | Discharge: 2013-08-20 | Disposition: A | Discharge status: Home / Self Care | Payer: Medicare PPO | Source: Ambulatory Visit | Attending: Internal Medicine | Admitting: Internal Medicine

## 2013-05-12 DIAGNOSIS — M810 Age-related osteoporosis without current pathological fracture: Principal | ICD-10-CM

## 2013-05-12 LAB — GLUCOSE, CAPILLARY
Glucose-Capillary: 172 mg/dL — ABNORMAL HIGH (ref 70–99)
Glucose-Capillary: 180 mg/dL — ABNORMAL HIGH (ref 70–99)

## 2013-05-12 LAB — PROTIME-INR: Prothrombin Time: 17.5 seconds — ABNORMAL HIGH (ref 11.6–15.2)

## 2013-05-12 MED ORDER — OXYCODONE-ACETAMINOPHEN 10-325 MG PO TABS: 1.0000 | ORAL_TABLET | Freq: Four times a day (QID) | ORAL | Status: DC | PRN
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
## 2013-05-12 NOTE — Progress Notes (Signed)
Pt being dc'd to facility for rehab, report called, pt stable upon dc, pt leaving via ambulance

## 2013-05-13 ENCOUNTER — Non-Acute Institutional Stay (SKILLED_NURSING_FACILITY): Payer: Medicare HMO | Admitting: Internal Medicine

## 2013-05-13 DIAGNOSIS — D500 Iron deficiency anemia secondary to blood loss (chronic): Secondary | ICD-10-CM

## 2013-05-13 DIAGNOSIS — I4891 Unspecified atrial fibrillation: Secondary | ICD-10-CM

## 2013-05-13 DIAGNOSIS — R030 Elevated blood-pressure reading, without diagnosis of hypertension: Secondary | ICD-10-CM

## 2013-05-13 DIAGNOSIS — S72009D Fracture of unspecified part of neck of unspecified femur, subsequent encounter for closed fracture with routine healing: Secondary | ICD-10-CM

## 2013-05-13 DIAGNOSIS — E119 Type 2 diabetes mellitus without complications: Secondary | ICD-10-CM

## 2013-05-13 DIAGNOSIS — D696 Thrombocytopenia, unspecified: Secondary | ICD-10-CM

## 2013-05-13 DIAGNOSIS — J441 Chronic obstructive pulmonary disease with (acute) exacerbation: Secondary | ICD-10-CM

## 2013-05-13 DIAGNOSIS — J449 Chronic obstructive pulmonary disease, unspecified: Secondary | ICD-10-CM

## 2013-05-13 DIAGNOSIS — S72002D Fracture of unspecified part of neck of left femur, subsequent encounter for closed fracture with routine healing: Secondary | ICD-10-CM

## 2013-05-13 DIAGNOSIS — I509 Heart failure, unspecified: Secondary | ICD-10-CM

## 2013-05-13 DIAGNOSIS — E662 Morbid (severe) obesity with alveolar hypoventilation: Secondary | ICD-10-CM

## 2013-05-13 LAB — GLUCOSE, CAPILLARY
Glucose-Capillary: 149 mg/dL — ABNORMAL HIGH (ref 70–99)
Glucose-Capillary: 196 mg/dL — ABNORMAL HIGH (ref 70–99)

## 2013-05-14 LAB — GLUCOSE, CAPILLARY
Glucose-Capillary: 162 mg/dL — ABNORMAL HIGH (ref 70–99)
Glucose-Capillary: 159 mg/dL — ABNORMAL HIGH (ref 70–99)
Glucose-Capillary: 188 mg/dL — ABNORMAL HIGH (ref 70–99)

## 2013-05-14 NOTE — Progress Notes (Signed)
Patient ID: [name], female   DOB: [birth year], 67 y.o.   MRN: [id number]  This is an acute visit.  Facility [facility].  Level of care skilled.  Chief complaint-acute visit status post hospitalization for left hip fracture with repair-.  History of present illness.  Patient is a pleasant 67 year old female who sustained a left hip fracture apparently after sustaining a fall at home.  This was complicated with a history of morbid obesity as well as atrial fibrillation with a supratherapeutic INR of 3.51.  She also has a history of diastolic CHF.  She did undergo an open reduction internal fixation-apparently this went well and she is here for therapy.  In regards to atrial fibrillation she is on chronic Coumadin INR was elevated on hospital admission this was reversed with vitamin K.  She is now on Lovenox as well as Coumadin will discontinue Lovenox when INR is at therapeutic level between 2 and 3.  She does have a history of diastolic CHF she did require a dose of IV Lasix on admission to the hospital-her home dose of Lasix was resumed postoperatively.  He does have a history of diabetes most recent hemoglobin A1c was 5.9.   She had been on Levemir 43 units this was decreased to Lantus 15 units secondary to a low hemoglobin A1c.  So far her blood sugars have been in the mid to high 100s generally.  She does have a history of obstructive sleep apnea apparently this was stable in the hospital she does use CPAP.  She also has normocytic anemia thought to be due to blood loss from the procedure-however speaking with her apparently she did have a GI consult pending for some history of occult positive stools it appears.  Previous medical history.  Left hip fracture status post repair.  Atrial fibrillation.  Diastolic CHF.  Obstructive sleep apnea.  Diabetes.  Hypertension.  Anemia-normocytic.  Thrombocytopenia.  COPD.  GERD. Coronary artery  disease.  Hyperlipidemia.  Previous history of cataract surgery.  Previous surgical history.  Cataract extraction with intraocular lens implant bilateral.  Appendectomy.  Cholecystectomy.  Carpal tunnel release right hand.  Cervical spine surgery-fusion.  History of cesarean section.  Umbilical hernial repair.  Left knee arthroscopically.  Of note she did have a colonoscopy 12/27/2004 that was normal.  She had an upper GI endoscopy on December 2003-which was normal.  Hospital studies.  05/06/2013-chest x-ray-showed no displaced fractures-vascular congestion and cardiomegaly.  HEENT 6 2014 elbow x-ray-showed no evidence of fracture or dislocation.  05/08/2013.  Postop x-ray of left hip showed near anatomic alignment after ORIF of the proximal left femur fracture.  01/07/2013.  X-ray left shoulder-did not show any acute process.  Medications.  Tylenol 650 mg every 6 hours when necessary pain Fosamax 70 mg q. weekly.  Dulcolax 10 mg daily when necessary.  Diltiazem 180 mg daily.  Lovenox 40 mg daily.  Fish oil 2 g daily.  Lasix 20 mg times a day.  NovoLog sliding scale insulin 4 times a day.  Lantus 15 units each bedtime.  Lisinopril 20 mg daily.  Milk of magnesia 30 mL daily when necessary Glucophage 500 mg twice a day.  Percocet -- 325 mg every 6 hours when necessary.  MiraLax daily when necessary.  Crestor 10 mg each bedtime.  Coumadin 5 mg every evening except Monday..    Social history.  Patient does live with her daughter and son-in-law and grandson child-denies any smoking history or alcohol or illicit drug use apparently  she ambulated at home with a cane.  Family history.  Mother did have a history of MI and colon cancer as well as. Diabetes  Father with a history of Alzheimer's dementia.  Does have a sister with a history of breast cancer.  Review of systems.  General denies any fever or chills.  Head ears eyes nose mouth  and throat-does not complaining of any visual changes nasal discharge or sore throat.  Skin-does not complaining of any issues per nursing staff surgical site appears unimpressive.  Perry respiratory-denies any shortness of breath or cough.  Does have a significant history of COPD.  Cardiac-does not complaining of any chest pain or palpitations has significant lymphedema which is baseline.  GI-does not complaining of any nausea vomiting diarrhea constipation or abdominal pain.  GU-denies dysuria.  Muscle skeletal-has significant weakness complicated with morbid obesity-she is not complaining of any joint pain says the hip pain is controlled with current medications.  Neurologic denies any headache dizziness does not complaining of numbness or tingling.  Psych-does not complaining of depression or anxiety.  Physical exam.  Temperature is 97.4 pulse 61 respirations 18 blood pressure 114/66.  In general this is a very pleasant morbidly obese female in no distress.  . Skin is warm and dry she does have a hematoma and posterior left arm this apparently was sustained in the fall violaceous appearing.  Eyes pupils appear equal round react to light sclera and conjunctiva are clear.  Oropharynx is clear mucous membranes moist.  Neck-he does have large fatty tissue could not appreciate any adenopathy.  Chest is clear to auscultation without rhonchi rales or wheezes.  Heart is regular rate and rhythm without murmur gallop or rub.  Abdomen is morbidly obese soft nontender with slightly hyperactive bowel sounds X. this could be due to the obesity.  GU is within normal limits no drainage bleeding or discharge or rash noted.  Musculoskeletal Limited exam since patient is bed-grip strength appears to be intact bilaterally she does have significant lymphedema of her lower extremities I would say 2+ as she does have positive pedal pulses but reduced I suspect secondary to the  edema.  Neurologic is grossly intact no lateralizing findings cranial nerves are intact her speech is clear.  Psych- is alert and oriented x3 pleasant and appropriate  . Labs.  05/13/2013.  WBC 8.3 hemoglobin 8.7 platelets 149.  Sodium 140 potassium 3.9 BUN 26 creatinine 0.91.    05/10/2013.  Sodium 140 potassium 4.2 BUN 21 creatinine 0.78.  05/06/2013.  Liver function tests within normal limits except alkaline phosphatase mildly elevated at 144 an albumin at 3.3.  Urine test 2014.  WBC 13.0 hemoglobin 9.4 platelets 118.  Assessment and plan.  #1-history of left hip fracture with repair-this appears to be stable she will need continued therapy and followup by orthopedics she says her pain is controlled on Percocet-she is on Lovenox and Coumadin for atrial fibrillation as well as DVT prophylaxis Will update INR tomorrow-discontinue Lovenox when INR is therapeutic.-- continue on Fosamax as well  2 atrial fibrillation this appears to be rate controlled again she is on anticoagulation-she is on diltiazem this appears to be stable at this point.  [id number] of diastolic CHF-she does continue on Lasix-monitor weights 2 times a week S. also update metabolic panel first Wednesday next week.  #4-history of diabetes-continues on Glucophage as well as Lantus and sliding scale we'll continue to monitor sofar CBGs in the mid to high 100s generally-hemoglobin A1c was kind of  low in hospital -- basal insulin was reduced  #5-hypertension-at this point appear stable she is on diltiazem as well as lisinopril.  6-anemia-this was thought to be blood loss-will repeat CBC first lab data next week-also will start on iron 325 mg a day-there is some history apparently of occult positive stool I suspect this will need GI  followup which apparently was scheduledbefore her fall.   #7 as COPD-discontinue on oxygen this has been stable she also has CPAP for history of obstructive sleep  apnea.  #8-thrombocytopenia-this was thought secondary to surgical blood loss-will repeat CBC next week.  #9-morbid obesity-I suspect this will presentt somewhat of a challenge during therapy will continue to monitor patient apparently was ambulating with a cane at home  [id number]-of note greater than 30 minutes spent assessing patient-reviewing the hospital records-and formulating plan of care for numerous diagnoses

## 2013-05-15 LAB — GLUCOSE, CAPILLARY
Glucose-Capillary: 145 mg/dL — ABNORMAL HIGH (ref 70–99)
Glucose-Capillary: 187 mg/dL — ABNORMAL HIGH (ref 70–99)

## 2013-05-15 NOTE — Clinical Social Work Psychosocial (Signed)
     Clinical Social Work Department BRIEF PSYCHOSOCIAL ASSESSMENT 05/15/2013  Patient:  [name], [name]     Account Number:  [id number]     Admit date:  05/06/2013  Clinical Social Worker:  Iona Coach  Date/Time:  05/09/2013 04:00 PM  Referred by:  Physician  Date Referred:  05/08/2013 Referred for  SNF Placement   Other Referral:   Interview type:  Patient Other interview type:    PSYCHOSOCIAL DATA Living Status:  ALONE Admitted from facility:   Level of care:   Primary support name:  Tish Frederickson  [id number] Primary support relationship to patient:  CHILD, ADULT Degree of support available:   Strong    CURRENT CONCERNS Current Concerns  Post-Acute Placement   Other Concerns:    SOCIAL WORK ASSESSMENT / PLAN CSW met with patient today- she was referred to CSW to assist with short term SNF placement for rehab.  Patient is agreable and verbalized a desire to go to [facility].Bed search process discussed and will be initiated. Fl2 on chart for MD's signature.  CSW will follow.   Assessment/plan status:  Psychosocial Support/Ongoing Assessment of Needs Other assessment/ plan:   Information/referral to community resources:   SNF bed list provied to patient. She will discuss with her daughter Inez Catalina.    PATIENTS/FAMILYS RESPONSE TO PLAN OF CARE: Pateint agrees to short term SNF; she also wants her daughter Inez Catalina involved in the process. CSW will contact daughter as soon as possible regarding SNF search.  Call made to [facility] per request of patient; message for for admissions director to call back regarding possible bed offers.

## 2013-05-15 NOTE — Clinical Social Work Placement (Signed)
     Clinical Social Work Department CLINICAL SOCIAL WORK PLACEMENT NOTE 05/15/2013  Patient:  [name], [name]  Account Number:  [id number] Admit date:  05/06/2013  Clinical Social Worker:  Butch Penny Rain [place], LCSWA  Date/time:  05/09/2013 04:30 PM  Clinical Social Work is seeking post-discharge placement for this patient at the following level of care:   SKILLED NURSING   (*CSW will update this form in Epic as items are completed)   05/09/2013  Patient/family provided with [place] Department of Clinical Social Works list of facilities offering this level of care within the geographic area requested by the patient (or if unable, by the patients family).  05/09/2013  Patient/family informed of their freedom to choose among providers that offer the needed level of care, that participate in Medicare, Medicaid or managed care program needed by the patient, have an available bed and are willing to accept the patient.  05/09/2013  Patient/family informed of [facility] ownership interest in [facility], as well as of the fact that they are under no obligation to receive care at this facility.  PASARR submitted to EDS on 05/10/2013 PASARR number received from EDS on 05/10/2013  FL2 transmitted to all facilities in geographic area requested by pt/family on  05/10/2013 FL2 transmitted to all facilities within larger geographic area on   Patient informed that his/her managed care company has contracts with or will negotiate with  certain facilities, including the following:   [facility] requested     Patient/family informed of bed offers received:  05/11/2013 Patient chooses bed at [facility] Physician recommends and patient chooses bed at    Patient to be transferred to [facility] on  05/12/2013 Patient to be transferred to facility by ambulance  [name])  The following physician request were entered in Epic:   Additional Comments: 05/12/13.  DC was  delayed from yesterday as [facility] authorization had not been obtained for patient.  She did not have a safe d/c home.  CSW notified SNF and pt' s nurse of d/c.  Patient and her daughter were pleased with d/c plan. No further CSW needs identified.  CSW signing off. Lorie Phenix. [place], [place]

## 2013-05-16 LAB — GLUCOSE, CAPILLARY: Glucose-Capillary: 165 mg/dL — ABNORMAL HIGH (ref 70–99)

## 2013-05-17 LAB — GLUCOSE, CAPILLARY
Glucose-Capillary: 150 mg/dL — ABNORMAL HIGH (ref 70–99)
Glucose-Capillary: 198 mg/dL — ABNORMAL HIGH (ref 70–99)
Glucose-Capillary: 189 mg/dL — ABNORMAL HIGH (ref 70–99)

## 2013-05-18 LAB — GLUCOSE, CAPILLARY
Glucose-Capillary: 174 mg/dL — ABNORMAL HIGH (ref 70–99)
Glucose-Capillary: 154 mg/dL — ABNORMAL HIGH (ref 70–99)

## 2013-05-19 ENCOUNTER — Non-Acute Institutional Stay (SKILLED_NURSING_FACILITY): Payer: Medicare HMO | Admitting: Internal Medicine

## 2013-05-19 DIAGNOSIS — S72009D Fracture of unspecified part of neck of unspecified femur, subsequent encounter for closed fracture with routine healing: Secondary | ICD-10-CM

## 2013-05-19 DIAGNOSIS — I509 Heart failure, unspecified: Secondary | ICD-10-CM

## 2013-05-19 DIAGNOSIS — E119 Type 2 diabetes mellitus without complications: Secondary | ICD-10-CM

## 2013-05-19 DIAGNOSIS — S72002D Fracture of unspecified part of neck of left femur, subsequent encounter for closed fracture with routine healing: Secondary | ICD-10-CM

## 2013-05-19 DIAGNOSIS — D696 Thrombocytopenia, unspecified: Secondary | ICD-10-CM

## 2013-05-19 DIAGNOSIS — I4891 Unspecified atrial fibrillation: Secondary | ICD-10-CM

## 2013-05-19 DIAGNOSIS — D500 Iron deficiency anemia secondary to blood loss (chronic): Secondary | ICD-10-CM

## 2013-05-19 NOTE — Progress Notes (Signed)
Patient ID: [name], female   DOB: [birth year], 67 y.o.   MRN: [id number] This is an acute visit.  Facility [facility].  Level of care skilled .  Chief complaint--acute visit-followup anemia-anticoagulationw ith history of left hip fracture-atrial fibrillation--diabetes .  History of present illness.  Patient is a pleasant 67 year old female who sustained a left hip fracture apparently after sustaining a fall at home.  This was complicated with a history of morbid obesity as well as atrial fibrillation with a supratherapeutic INR of 3.51.  She also has a history of diastolic CHF.  She did undergo an open reduction internal fixation-apparently this went well and she is here for therapy.  In regards to atrial fibrillation she is on chronic Coumadin INR was elevated on hospital admission this was reversed with vitamin K.  She is now on Lovenox as well as Coumadin --INR has varied from the high ones to low twos-however now appears to be a bit more consistent in the low twos and will DC Lovenox an update INR tomorrow INR today is 2.09 l.  She does have a history of diastolic CHF she did require a dose of IV Lasix on admission to the hospital-her home dose of Lasix was resumed postoperatively.   does have a history of diabetes most recent hemoglobin A1c was 5.9.  She had been on Levemir 43 units this was decreased to Lantus 15 units secondary to a low hemoglobin A1c.  Her CBG's appear to run fairly consistently in the mid 100s.     She also has normocytic anemia thought to be due to blood loss from the procedure-however speaking with her apparently she did have a GI consult pending for some history of occult positive stools it appears Her hemoglobin appears to be stable from her admission hemoglobin currently 8.8.--She has been started on iron  .   Previous medical history .  Left hip fracture status post repair.  Atrial fibrillation.  Diastolic CHF.  Obstructive sleep apnea.  Diabetes.   Hypertension.  Anemia-normocytic.  Thrombocytopenia.  COPD.  GERD. Coronary artery disease.  Hyperlipidemia.  Previous history of cataract surgery.  Previous surgical history.  Cataract extraction with intraocular lens implant bilateral.  Appendectomy.  Cholecystectomy.  Carpal tunnel release right hand.  Cervical spine surgery-fusion.  History of cesarean section.  Umbilical hernial repair.  Left knee arthroscopically.  Of note she did have a colonoscopy 12/27/2004 that was normal.  She had an upper GI endoscopy on December 2003-which was normal.   Hospital studies.  05/06/2013-chest x-ray-showed no displaced fractures-vascular congestion and cardiomegaly.  HEENT 6 2014 elbow x-ray-showed no evidence of fracture or dislocation.  05/08/2013.  Postop x-ray of left hip showed near anatomic alignment after ORIF of the proximal left femur fracture.  01/07/2013.  X-ray left shoulder-did not show any acute process .  Medications.  Tylenol 650 mg every 6 hours when necessary pain Fosamax 70 mg q. weekly.  Dulcolax 10 mg daily when necessary.  Diltiazem 180 mg daily.  Lovenox 40 mg daily.  Fish oil 2 g daily.  Lasix 20 mg   3 times a day.  NovoLog sliding scale insulin 4 times a day.  Lantus 15 units each bedtime.  Lisinopril 20 mg daily.  Milk of magnesia 30 mL daily when necessary Glucophage 500 mg twice a day.  Percocet -- 325 mg every 6 hours when necessary.  MiraLax daily when necessary.  Crestor 10 mg each bedtime.  Coumadin 5 mg every evening except Monday .Marland Kitchen  Social history.  Patient does live with her daughter and son-in-law and grandson child-denies any smoking history or alcohol or illicit drug use apparently she ambulated at home with a cane.   Family history.  Mother did have a history of MI and colon cancer as well as. Diabetes  Father with a history of Alzheimer's dementia.  Does have a sister with a history of breast cancer.   Review of systems.  General  denies any fever or chills.  Head ears eyes nose mouth and throat-does not complaining of any visual changes nasal discharge or sore throat.  Skin-does not complaining of any issues per nursing staff surgical site appears unimpressive.  Perry respiratory-denies any shortness of breath or cough.  Does have a significant history of COPD.  Cardiac-does not complaining of any chest pain or palpitations has significant lymphedema which is baseline.  GI-does not complaining of any nausea vomiting diarrhea--does complain of constipation.  GU-denies dysuria.  Muscle skeletal-has significant weakness complicated with morbid obesity-she is not complaining of any joint pain says the hip pain is controlled with current medications.  Neurologic denies any headache dizziness does not complaining of numbness or tingling.  Psych-does not complaining of depression or anxiety.   Physical exam.  Temperature 97.0 pulse 70 respirations 20 blood pressure 118/70--- .  In general this is a very pleasant morbidly obese female in no distress.  . Skin is warm and dry she does have a hematoma and posterior left arm this apparently was sustained in the fall violaceous appearing.  Eyes pupils appear equal round react to light sclera and conjunctiva are clear.  Oropharynx is clear mucous membranes moist.  .  Chest . Largely clear to auscultation some slight rhonchi the right lung field--no labored breathing Heart is regular rate and rhythm without murmur gallop or rub.  Abdomen is morbidly obese soft nontender with active bowel sounds.  GU is within normal limits no drainage bleeding or discharge or rash noted.  Musculoskeletal -- she does have significant lymphedema of her lower extremities--this appears relatively baseline-she is able to move her upper extremities and lower right leg-- left leg somewhat limited secondary to the hip fracture.  Neurologic is grossly intact no lateralizing findings cranial nerves are  intact her speech is clear.  Psych- is alert and oriented x3 pleasant and appropriate   . Labs  62/19/2014.  INR-2.09  05/16/2013.  WBC 7.8 hemoglobin 8.8 platelets 192.  Sodium 141 potassium 4.1 BUN 27 creatinine 0.93.  INR-1.96 .  05/13/2013.  WBC 8.3 hemoglobin 8.7 platelets 149.  Sodium 140 potassium 3.9 BUN 26 creatinine 0.91.  05/10/2013.  Sodium 140 potassium 4.2 BUN 21 creatinine 0.78.  05/06/2013.  Liver function tests within normal limits except alkaline phosphatase mildly elevated at 144 an albumin at 3.3.  Urine test 2014.  WBC 13.0 hemoglobin 9.4 platelets 118 .  Assessment and plan.  #1-history of left hip fracture with repair-this appears to be stable she will need continued therapy and followup by orthopedics she says her pain is controlled on Percocet-she is on Lovenox and Coumadin for atrial fibrillation as well as DVT prophylaxis--INR is now therapeutic we'll discontinue Lovenox check INR tomorrow  continue on Fosamax as well  2 atrial fibrillation this appears to be rate controlled again she is on anticoagulation-she is on diltiazem this appears to be stable at this point.  99991111 of diastolic CHF-she does continue on Lasix--- weight appears to be stable at [id number] metabolic panel appear to be fairly unremarkable--will  update this next week.  #4-history of diabetes-continues on Glucophage as well as Lantus and sliding scale we'll continue to monitor sofar CBGs in the mid  100s generally-hemoglobin A1c was kind of low in hospital -- basal insulin was reduced  #5-hypertension-at this point appear stable she is on diltiazem as well as lisinopril.  6-anemia-this was thought to be blood loss-CBC shows hemoglobin is relatively stable from discharge hemoglobin-she has been started on iron-there is some history apparently of occult positive stool I suspect this will need GI followup which apparently was scheduledbefore her fall--will update CBC  next week. --Also  guaic stools x3 #7-constipation-she does have MiraLax also Dulcolax as needed I did speak nursing and they will give her her when necessary laxative and monitor-abdominal exam was baseline  quite benign    #8-thrombocytopenia-this was thought secondary to surgical blood loss- this is improving       [id number]--of note 30 minutes spent assessing patient-discussing her concerns-and formulating a plan of care for numerous diagnoses

## 2013-05-20 ENCOUNTER — Other Ambulatory Visit: Payer: Self-pay

## 2013-05-20 LAB — GLUCOSE, CAPILLARY

## 2013-05-20 MED ORDER — METFORMIN HCL 500 MG PO TABS: 500.0000 mg | ORAL_TABLET | Freq: Two times a day (BID) | ORAL | Status: DC

## 2013-05-21 LAB — GLUCOSE, CAPILLARY
Glucose-Capillary: 122 mg/dL — ABNORMAL HIGH (ref 70–99)
Glucose-Capillary: 161 mg/dL — ABNORMAL HIGH (ref 70–99)

## 2013-05-24 LAB — GLUCOSE, CAPILLARY

## 2013-05-25 ENCOUNTER — Non-Acute Institutional Stay (SKILLED_NURSING_FACILITY): Payer: Medicare HMO | Admitting: Internal Medicine

## 2013-05-25 DIAGNOSIS — I4891 Unspecified atrial fibrillation: Secondary | ICD-10-CM

## 2013-05-25 DIAGNOSIS — D649 Anemia, unspecified: Secondary | ICD-10-CM

## 2013-05-25 DIAGNOSIS — I509 Heart failure, unspecified: Secondary | ICD-10-CM

## 2013-05-25 DIAGNOSIS — D696 Thrombocytopenia, unspecified: Secondary | ICD-10-CM

## 2013-05-25 LAB — GLUCOSE, CAPILLARY: Glucose-Capillary: 118 mg/dL — ABNORMAL HIGH (ref 70–99)

## 2013-05-25 NOTE — Progress Notes (Signed)
Patient ID: [name], female   DOB: 1946-05-16, 67 y.o.   MRN: [id number] This is an acute visit.  Facility [facility].  Level of care skilled  .  Chief complaint--acute visit-followup anemia-anticoagulation ith history of left hip fracture-atrial fibrillation-- .  History of present illness.  Patient is a pleasant 67 year old female who sustained a left hip fracture apparently after sustaining a fall at home.  This was complicated with a history of morbid obesity as well as atrial fibrillation with a supratherapeutic INR of 3.51.  She also has a history of diastolic CHF.  She did undergo an open reduction internal fixation-apparently this went well and she is here for therapy.  In regards to atrial fibrillation she is on chronic Coumadin INR was elevated on hospital admission this was reversed with vitamin K.  She is was on Lovenox as well as Coumadin-- l. INR recently became therapeutic hovering around 2 and Lovenox has been discontinued a INR was 2.09 on 6-19 it dipped a bit to 1.95 on the 20th but she is now 2.18 as of today she is on Coumadin 5.5 mg this was increased from 5 mg 5 days ago-she receives Coumadin every day except Monday She does have a history of diastolic CHF she did require a dose of IV Lasix on admission to the hospital-her home dose of Lasix was resumed postoperatively.-- is on 20 mg 3 times a day and apparently has been on this for some time she appears to be stable in this regard     She also has normocytic anemia thought to be due to blood loss from the procedure-however speaking with her  Last week apparently she did have a GI consult pending for some history of occult positive stools it appears  Her hemoglobin appears to be relatively stable from her admission hemoglobin currently 8.5.--She has been started on iron  .  Previous medical history  .  Left hip fracture status post repair.  Atrial fibrillation.  Diastolic CHF.  Obstructive sleep apnea.  Diabetes.   Hypertension.  Anemia-normocytic.  Thrombocytopenia.  COPD.  GERD. Coronary artery disease.  Hyperlipidemia.  Previous history of cataract surgery.  Previous surgical history.  Cataract extraction with intraocular lens implant bilateral.  Appendectomy.  Cholecystectomy.  Carpal tunnel release right hand.  Cervical spine surgery-fusion.  History of cesarean section.  Umbilical hernial repair.  Left knee arthroscopically.  Of note she did have a colonoscopy 12/27/2004 that was normal.  She had an upper GI endoscopy on December 2003-which was normal.  Hospital studies.  05/06/2013-chest x-ray-showed no displaced fractures-vascular congestion and cardiomegaly.  HEENT 6 2014 elbow x-ray-showed no evidence of fracture or dislocation.  05/08/2013.  Postop x-ray of left hip showed near anatomic alignment after ORIF of the proximal left femur fracture.  01/07/2013.  X-ray left shoulder-did not show any acute process  .  Medications.  Tylenol 650 mg every 6 hours when necessary pain  Fosamax 70 mg q. weekly.  Dulcolax 10 mg daily when necessary.  Diltiazem 180 mg daily.  .  Fish oil 2 g daily.  Lasix 20 mg 3 times a day.  NovoLog sliding scale insulin 4 times a day.  Lantus 15 units each bedtime.  Lisinopril 20 mg daily.  Milk of magnesia 30 mL daily when necessary Glucophage 500 mg twice a day.  Percocet -- 325 mg every 6 hours when necessary.  MiraLax daily when necessary.  Crestor 10 mg each bedtime.  Coumadin 5.5  mg every evening except  Monday  .Marland Kitchen  Social history.  Patient does live with her daughter and son-in-law and grandson child-denies any smoking history or alcohol or illicit drug use apparently she ambulated at home with a cane.  Family history.  Mother did have a history of MI and colon cancer as well as. Diabetes  Father with a history of Alzheimer's dementia.  Does have a sister with a history of breast cancer.   Review of systems.  General denies any fever  or chills.  Head ears eyes nose mouth and throat-does not complaining of any visual changes nasal discharge or sore throat.  Skin-does not complaining of any issues per nursing staff .  respiratory-denies any shortness of breath or cough.  Does have a significant history of COPD.  Cardiac-does not complaining of any chest pain or palpitations has significant lymphedema which is baseline.  GI-does not complaining of any nausea vomiting diarrhea-. Does not complain of constipation today  GU-denies dysuria.  Muscle skeletal-has significant weakness complicated with morbid obesity-she is not complaining of any joint pain says the hip pain is controlled with current medications.  Neurologic denies any headache dizziness does not complaining of numbness or tingling.  Psych-does not complaining of depression or anxiety .  Physical exam.  Temperature 97.8 pulse 66 respirations 16 blood pressure 124/44.  --  .  In general this is a very pleasant morbidly obese female in no distress.  . Skin is warm and dry .  Eyes pupils appear equal round react to light sclera and conjunctiva are clear.  Oropharynx is clear mucous membranes moist.  .  Chest . clear to auscultation --no labored breathing  Heart is  iregular rate and rhythm without murmur gallop or rub.  Abdomen is morbidly obese soft nontender with active bowel sounds.  .  Musculoskeletal -- she does have significant lymphedema of her lower extremities--this appears relatively baseline-she is able to move her upper extremities and lower right leg-- left leg somewhat limited secondary to the hip fracture.  Neurologic is grossly intact no lateralizing findings cranial nerves are intact her speech is clear.  Psych- is alert and oriented x3 pleasant and appropriate   . Labs  05/23/2013.  WBC 6.8 hemoglobin 8.5 platelets 289.  Sodium 141 potassium 4.7 BUN 33 creatinine 1.18.    62/19/2014.  INR-2.09  05/16/2013.  WBC 7.8 hemoglobin 8.8  platelets 192.  Sodium 141 potassium 4.1 BUN 27 creatinine 0.93.  INR-1.96  .  05/13/2013.  WBC 8.3 hemoglobin 8.7 platelets 149.  Sodium 140 potassium 3.9 BUN 26 creatinine 0.91.  05/10/2013.  Sodium 140 potassium 4.2 BUN 21 creatinine 0.78.  05/06/2013.  Liver function tests within normal limits except alkaline phosphatase mildly elevated at 144 an albumin at 3.3.  Urine test 2014.  WBC 13.0 hemoglobin 9.4 platelets 118  .  Assessment and plan.     1 atrial fibrillation this appears to be rate controlled again she is on anticoagulation-she is on diltiazem this appears to be stable at this point. --INR is therapeutic Will recheck level on Monday, June 30  [id number] of diastolic CHF-she does continue on Lasix--- weight appears to be stable at [id number] metabolic panel appear to be Fairlybaseline however the creatinine is inching upsome Will recheck this on Monday -Will decrease Lasix to 20 mg twice a day for a couple days-and again recheck BMP first Lab day next week.   l.  3-anemia-this was thought to be blood loss-CBC shows hemoglobin is relatively stable from discharge hemoglobin-she has  been started on iron-there is some history apparently of occult positive stool I suspect this will need GI followup which apparently was scheduledbefore her fall--will update CBC next week. --Also guaic stools x3 have been ordered--Will also obtain iron panel ferritin B12 and folate-this appears to be a normocytic anemiaas --We will obtain retic count as well  [id number]-of note rater than 30 minutes spent assessing patient and formulating a plan of care for numerous diagnoses    #4-thrombocytopenia-this was thought secondary to surgical blood loss- this is improving

## 2013-05-26 LAB — GLUCOSE, CAPILLARY
Glucose-Capillary: 136 mg/dL — ABNORMAL HIGH (ref 70–99)
Glucose-Capillary: 159 mg/dL — ABNORMAL HIGH (ref 70–99)

## 2013-05-27 LAB — GLUCOSE, CAPILLARY: Glucose-Capillary: 182 mg/dL — ABNORMAL HIGH (ref 70–99)

## 2013-05-28 LAB — GLUCOSE, CAPILLARY
Glucose-Capillary: 108 mg/dL — ABNORMAL HIGH (ref 70–99)
Glucose-Capillary: 175 mg/dL — ABNORMAL HIGH (ref 70–99)

## 2013-05-30 ENCOUNTER — Non-Acute Institutional Stay (SKILLED_NURSING_FACILITY): Payer: Medicare HMO | Admitting: Internal Medicine

## 2013-05-30 DIAGNOSIS — S72002D Fracture of unspecified part of neck of left femur, subsequent encounter for closed fracture with routine healing: Secondary | ICD-10-CM

## 2013-05-30 DIAGNOSIS — S72009D Fracture of unspecified part of neck of unspecified femur, subsequent encounter for closed fracture with routine healing: Secondary | ICD-10-CM

## 2013-05-30 DIAGNOSIS — I509 Heart failure, unspecified: Secondary | ICD-10-CM

## 2013-05-30 DIAGNOSIS — I4891 Unspecified atrial fibrillation: Secondary | ICD-10-CM

## 2013-05-30 LAB — GLUCOSE, CAPILLARY: Glucose-Capillary: 139 mg/dL — ABNORMAL HIGH (ref 70–99)

## 2013-05-31 ENCOUNTER — Other Ambulatory Visit (HOSPITAL_COMMUNITY): Payer: Medicare PPO

## 2013-05-31 LAB — GLUCOSE, CAPILLARY: Glucose-Capillary: 188 mg/dL — ABNORMAL HIGH (ref 70–99)

## 2013-06-01 LAB — GLUCOSE, CAPILLARY
Glucose-Capillary: 127 mg/dL — ABNORMAL HIGH (ref 70–99)
Glucose-Capillary: 156 mg/dL — ABNORMAL HIGH (ref 70–99)

## 2013-06-01 NOTE — Progress Notes (Signed)
Patient ID: [name], female   DOB: 05-Jul-1946, 67 y.o.   MRN: [id number]           PROGRESS NOTE  DATE:  05/30/2013  FACILITY: [facility]   LEVEL OF CARE:   SNF   Acute Visit   CHIEF COMPLAINT:  Follow up congestive heart failure, atrial fibrillation.    HISTORY OF PRESENT ILLNESS:  This is a 67 year-old woman who lives alone in her own home.  She had a left hip fracture.     She received some Lasix for CHF.  She has a history of diastolic dysfunction.     She is also known to be in atrial fibrillation.  She is on chronic Coumadin.    PAST MEDICAL HISTORY:  Reviewed.    She was on Levemir 43 U twice daily.  Her  hemoglobin A1C was 5.9.  I believe a long-acting insulin was reduced to Lantus 15 U at bedtime.    She also has obstructive sleep apnea, on chronic oxygen with nightly CPAP.     Also listed as having COPD.    REVIEW OF SYSTEMS:   CHEST/RESPIRATORY:  The patient is not complaining of shortness of breath.   CARDIAC:   Fleeting left upper chest pain.  This lasts less than a second, which is non-exertional.   I wonder whether this might be musculoskeletal rather than anything more sinister.      PHYSICAL EXAMINATION:   VITAL SIGNS:   PULSE:  77.   RESPIRATIONS:  18. O2 SATURATIONS:  98% on 2 L.   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  Otherwise, air entry is clear.   CARDIOVASCULAR:  CARDIAC:   Heart sounds sound fairly regular.   No murmurs.  JVP is not elevated.   EDEMA/VARICOSITIES:  Severe chronic venous stasis, probable lymphedema.  She has not had wound care issues here.     ASSESSMENT/PLAN:  Left hip fracture.  She tells me she has had multiple fractures, although none of them that recent.   Although she is obese, she has a thoracic kyphosis and probably should be screened for osteoporosis.  I think she should also be screened for vitamin D deficiency.    Atrial fibrillation.  This appears to be stable.  She is back  on Coumadin.  Her INR is 2.24 today.      Anemia.  Hemoglobin of 8.7.  Fecal occult blood is negative.    Renal insufficiency.  This appears to be stable, although it will need to be monitored.  I wonder whether she requires 20 mg  t.i.d. of Lasix.    Morbid obesity with sleep apnea.    She is on chronic oxygen and CPAP.  This does not appear to be unstable.    CPT CODE: 57846

## 2013-06-02 LAB — GLUCOSE, CAPILLARY
Glucose-Capillary: 121 mg/dL — ABNORMAL HIGH (ref 70–99)
Glucose-Capillary: 162 mg/dL — ABNORMAL HIGH (ref 70–99)

## 2013-06-04 LAB — GLUCOSE, CAPILLARY: Glucose-Capillary: 113 mg/dL — ABNORMAL HIGH (ref 70–99)

## 2013-06-05 LAB — GLUCOSE, CAPILLARY: Glucose-Capillary: 135 mg/dL — ABNORMAL HIGH (ref 70–99)

## 2013-06-06 LAB — GLUCOSE, CAPILLARY
Glucose-Capillary: 136 mg/dL — ABNORMAL HIGH (ref 70–99)
Glucose-Capillary: 161 mg/dL — ABNORMAL HIGH (ref 70–99)

## 2013-06-07 LAB — GLUCOSE, CAPILLARY: Glucose-Capillary: 119 mg/dL — ABNORMAL HIGH (ref 70–99)

## 2013-06-08 LAB — GLUCOSE, CAPILLARY
Glucose-Capillary: 113 mg/dL — ABNORMAL HIGH (ref 70–99)
Glucose-Capillary: 178 mg/dL — ABNORMAL HIGH (ref 70–99)

## 2013-06-09 LAB — GLUCOSE, CAPILLARY: Glucose-Capillary: 122 mg/dL — ABNORMAL HIGH (ref 70–99)

## 2013-06-10 LAB — GLUCOSE, CAPILLARY: Glucose-Capillary: 118 mg/dL — ABNORMAL HIGH (ref 70–99)

## 2013-06-11 LAB — GLUCOSE, CAPILLARY

## 2013-06-12 LAB — GLUCOSE, CAPILLARY: Glucose-Capillary: 129 mg/dL — ABNORMAL HIGH (ref 70–99)

## 2013-06-13 LAB — GLUCOSE, CAPILLARY
Glucose-Capillary: 110 mg/dL — ABNORMAL HIGH (ref 70–99)
Glucose-Capillary: 141 mg/dL — ABNORMAL HIGH (ref 70–99)

## 2013-06-15 LAB — GLUCOSE, CAPILLARY: Glucose-Capillary: 139 mg/dL — ABNORMAL HIGH (ref 70–99)

## 2013-06-16 LAB — GLUCOSE, CAPILLARY
Glucose-Capillary: 117 mg/dL — ABNORMAL HIGH (ref 70–99)
Glucose-Capillary: 126 mg/dL — ABNORMAL HIGH (ref 70–99)

## 2013-06-18 LAB — GLUCOSE, CAPILLARY
Glucose-Capillary: 174 mg/dL — ABNORMAL HIGH (ref 70–99)
Glucose-Capillary: 116 mg/dL — ABNORMAL HIGH (ref 70–99)
Glucose-Capillary: 137 mg/dL — ABNORMAL HIGH (ref 70–99)

## 2013-06-23 LAB — GLUCOSE, CAPILLARY
Glucose-Capillary: 171 mg/dL — ABNORMAL HIGH (ref 70–99)
Glucose-Capillary: 129 mg/dL — ABNORMAL HIGH (ref 70–99)

## 2013-06-24 ENCOUNTER — Other Ambulatory Visit: Payer: Self-pay | Admitting: Geriatric Medicine

## 2013-06-24 LAB — GLUCOSE, CAPILLARY
Glucose-Capillary: 115 mg/dL — ABNORMAL HIGH (ref 70–99)
Glucose-Capillary: 168 mg/dL — ABNORMAL HIGH (ref 70–99)

## 2013-06-24 MED ORDER — OXYCODONE-ACETAMINOPHEN 10-325 MG PO TABS: 1.0000 | ORAL_TABLET | Freq: Four times a day (QID) | ORAL | Status: DC | PRN

## 2013-06-27 LAB — GLUCOSE, CAPILLARY: Glucose-Capillary: 170 mg/dL — ABNORMAL HIGH (ref 70–99)

## 2013-06-28 LAB — GLUCOSE, CAPILLARY
Glucose-Capillary: 126 mg/dL — ABNORMAL HIGH (ref 70–99)
Glucose-Capillary: 144 mg/dL — ABNORMAL HIGH (ref 70–99)

## 2013-06-29 ENCOUNTER — Non-Acute Institutional Stay (SKILLED_NURSING_FACILITY): Payer: Medicare HMO | Admitting: Internal Medicine

## 2013-06-29 DIAGNOSIS — I509 Heart failure, unspecified: Secondary | ICD-10-CM

## 2013-06-29 DIAGNOSIS — E662 Morbid (severe) obesity with alveolar hypoventilation: Secondary | ICD-10-CM

## 2013-06-29 DIAGNOSIS — I4891 Unspecified atrial fibrillation: Secondary | ICD-10-CM

## 2013-06-29 LAB — GLUCOSE, CAPILLARY: Glucose-Capillary: 112 mg/dL — ABNORMAL HIGH (ref 70–99)

## 2013-06-29 NOTE — Progress Notes (Signed)
Patient ID: [name], female   DOB: 1946-02-21, 67 y.o.   MRN: [id number]   CHIEF COMPLAINT:  Follow up congestive heart failure, atrial fibrillation.    HISTORY OF PRESENT ILLNESS:  This is a 67 year-old woman who lives alone in her own home.  She had a left hip fracture.     She received some Lasix for CHF.  She has a history of diastolic dysfunction.     She is also known to be in atrial fibrillation.  She is on chronic Coumadin.    PAST MEDICAL HISTORY:  Reviewed.    She was on Levemir 43 U twice daily.  Her  hemoglobin A1C was 5.9.  I believe a long-acting insulin was reduced to Lantus 15 U at bedtime.     She also has obstructive sleep apnea, on chronic oxygen with nightly CPAP.      Also listed as having COPD.   REVIEW OF SYSTEMS:  patient's weight has increased from 317.5 when she first arrived here in mid June to 327. She tells me that her baseline weight was 295 pounds before she had her left hip fracture. CHEST/RESPIRATORY:  The patient is not complaining of shortness of breath.    CARDIAC:   no chest pain Musculoskeletal; she is still nonweightbearing on the left leg. Endocrine; on a combination of Lantus 15 units at bedtime and metformin her CBGs are quite satisfactory in the low to mid 100  PHYSICAL EXAMINATION:   VITAL SIGNS:    PULSE:  62  RESPIRATIONS:  18. O2 SATURATIONS:  98% on 2 L.    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  Otherwise, air entry is clear.    CARDIOVASCULAR:   CARDIAC:   Heart sounds sound fairly regular.   No murmurs.  JVP is not elevated.    EDEMA/VARICOSITIES:  Severe chronic venous stasis, significant lymphedema.   ASSESSMENT/PLAN:  Left hip fracture.  she is still nonweightbearing , tells me she hops along with her walker.    Atrial fibrillation.  This appears to be stable.  She is back on Coumadin.  Her INR is 3.02 yesterday    Anemia. Her hemoglobin will need to be rechecked. Stool was tested guaiac  negative last month.    Diastolically mediated heart failure she is on Lasix 20 twice a day. Her bedside assessment did not suggest CHF however in people with morbid obesity this is difficult to determine and is certainly not reliable. In this setting I think a BNP is in order although I will increase her Lasix to 40 mg twice a day      Morbid obesity with sleep apnea.    She is on chronic oxygen and CPAP.  This does not appear to be unstable.  she has been on chronic oxygen for years

## 2013-07-01 LAB — GLUCOSE, CAPILLARY
Glucose-Capillary: 117 mg/dL — ABNORMAL HIGH (ref 70–99)
Glucose-Capillary: 132 mg/dL — ABNORMAL HIGH (ref 70–99)

## 2013-07-02 ENCOUNTER — Other Ambulatory Visit: Payer: Self-pay | Admitting: Family Medicine

## 2013-07-03 LAB — GLUCOSE, CAPILLARY: Glucose-Capillary: 114 mg/dL — ABNORMAL HIGH (ref 70–99)

## 2013-07-04 ENCOUNTER — Other Ambulatory Visit: Payer: Self-pay | Admitting: Family Medicine

## 2013-07-04 LAB — GLUCOSE, CAPILLARY
Glucose-Capillary: 164 mg/dL — ABNORMAL HIGH (ref 70–99)
Glucose-Capillary: 147 mg/dL — ABNORMAL HIGH (ref 70–99)

## 2013-07-04 NOTE — Telephone Encounter (Signed)
Patient appears to be in a nursing home. Please review. Wasn't sure if we should refill

## 2013-07-05 LAB — GLUCOSE, CAPILLARY
Glucose-Capillary: 117 mg/dL — ABNORMAL HIGH (ref 70–99)
Glucose-Capillary: 158 mg/dL — ABNORMAL HIGH (ref 70–99)

## 2013-07-06 ENCOUNTER — Non-Acute Institutional Stay (SKILLED_NURSING_FACILITY): Payer: Medicare HMO | Admitting: Internal Medicine

## 2013-07-06 DIAGNOSIS — I5032 Chronic diastolic (congestive) heart failure: Secondary | ICD-10-CM

## 2013-07-06 DIAGNOSIS — I4891 Unspecified atrial fibrillation: Secondary | ICD-10-CM

## 2013-07-06 DIAGNOSIS — I509 Heart failure, unspecified: Secondary | ICD-10-CM

## 2013-07-06 DIAGNOSIS — E119 Type 2 diabetes mellitus without complications: Secondary | ICD-10-CM

## 2013-07-06 DIAGNOSIS — D649 Anemia, unspecified: Secondary | ICD-10-CM

## 2013-07-06 LAB — GLUCOSE, CAPILLARY: Glucose-Capillary: 181 mg/dL — ABNORMAL HIGH (ref 70–99)

## 2013-07-06 NOTE — Progress Notes (Signed)
Patient ID: [name], female   DOB: 1946/03/29, 67 y.o.   MRN: [id number] This is an acute visit.   Facility [facility].   Level of care skilled   .   Chief complaint--acute visit-followup CHF-anemia-diabetes- .   History of present illness.   Patient is a pleasant 67 year old female who sustained a left hip fracture apparently after sustaining a fall at home.   This was complicated with a history of morbid obesity as well as atrial fibrillation with a supratherapeutic INR of 3.51.   She also has a history of diastolic CHF.   She did undergo an open reduction internal fixation-apparently this went well and she is here for therapy. She recently had some weight gain and Dr. Dellia Nims did see her and increased her Lasix to 40 mg twice a day she had been on 20.  I am following up on this appears her weight has stabilized [name] appears she's lost about 6 pounds the past several days most recent weight is 319 done 3 days ago.  She does not report any shortness of breath appears to be stable in this regards.  Another issue has been an anemia this was thought to be from acute blood loss following surgery however she did have a GI consult pending before her fall-and apparently this was going to be worked up we have been following her hemoglobin right now it is stable at 8.2 her recent labs she is on iron so far her guaiac stools have been negative.  She is also a type II diabetic on Lantus as well as Glucophage Lantus was reduced apparently in the hospital secondary to hypoglycemia concerns her blood sugars appear to be quite stable in the low 100s in the morning and later in the day in the mid 100s her hemoglobin A1c in the hospital apparently was slightly over 5  Her vital signs continued to be stable her blood pressures appear to be well controlled.  In regards to atrial fibrillation and INR is pending most recent INR was slightly elevated at just over 3 she is on 5 mg we will recheck this there has  been no increased bruising or bleeding.     .   Previous medical history   .   Left hip fracture status post repair.   Atrial fibrillation.   Diastolic CHF.   Obstructive sleep apnea.   Diabetes.   Hypertension.   Anemia-normocytic.   Thrombocytopenia.   COPD.   GERD. Coronary artery disease.   Hyperlipidemia.   Previous history of cataract surgery.   Previous surgical history.   Cataract extraction with intraocular lens implant bilateral.   Appendectomy.   Cholecystectomy.   Carpal tunnel release right hand.   Cervical spine surgery-fusion.   History of cesarean section.   Umbilical hernial repair.   Left knee arthroscopically.   Of note she did have a colonoscopy 12/27/2004 that was normal.   She had an upper GI endoscopy on December 2003-which was normal.    Hospital studies.   05/06/2013-chest x-ray-showed no displaced fractures-vascular congestion and cardiomegaly.   HEENT 6 2014 elbow x-ray-showed no evidence of fracture or dislocation.   05/08/2013.   Postop x-ray of left hip showed near anatomic alignment after ORIF of the proximal left femur fracture.   01/07/2013.   X-ray left shoulder-did not show any acute process   .   Medications. Reviewed per MAR    .Marland Kitchen   Social history.   Patient does live with her daughter  and son-in-law and grandson child-denies any smoking history or alcohol or illicit drug use apparently she ambulated at home with a cane.   Family history.   Mother did have a history of MI and colon cancer as well as. Diabetes   Father with a history of Alzheimer's dementia.   Does have a sister with a history of breast cancer.    Review of systems.   General denies any fever or chills.   Head ears eyes nose mouth and throat-does not complaining of any visual changes nasal discharge or sore throat.   Skin-does not complaining of any issues per nursing staff .  respiratory-denies any shortness of breath or cough.   Does have a significant  history of COPD.   Cardiac-does not complaining of any chest pain or palpitations has significant lymphedema which is baseline.   GI-does not complaining of any nausea vomiting diarrhea-. Does not complain of constipation today   GU-denies dysuria.   Muscle skeletal-has significant weakness complicated with morbid obesity-she is not complaining of any joint pain says the hip pain is controlled with current medications.   Neurologic denies any headache dizziness does not complaining of numbness or tingling.   Psych-does not complaining of depression or anxiety .   Physical exam.  Temperature 97.8 pulse 66 respirations 20 blood pressure 125/71 weight is 319 .   --   .   In general this is a very pleasant morbidly obese female in no distress.   . Skin is warm and dry .   Eyes pupils appear equal round react to light sclera and conjunctiva are clear.   Oropharynx is clear mucous membranes moist.   .   Chest . clear to auscultation --no labored breathing   Heart is regular irregular rate and rhythm without murmur gallop or rub.   Abdomen is morbidly obese soft nontender with active bowel sounds.   .   Musculoskeletal -- she does have significant lymphedema of her lower extremities--this appears relatively baseline-she is able to move her upper extremities and lower right leg-- left leg somewhat limited secondary to the hip fracture--but this has improved.   Neurologic is grossly intact no lateralizing findings cranial nerves are intact her speech is clear.   Psych- is alert and oriented x3 pleasant and appropriate    . Labs 07/01/2013.  Pro BNP-2004.  Sodium 138 potassium 4.1 BUN 19 creatinine 1.13.  Hemoglobin 8.2 this is normocytic.  06/19/2013.  INR 3.02.     05/23/2013.   WBC 6.8 hemoglobin 8.5 platelets 289.   Sodium 141 potassium 4.7 BUN 33 creatinine 1.18.      62/19/2014.   INR-2.09   05/16/2013.   WBC 7.8 hemoglobin 8.8 platelets 192.   Sodium 141  potassium 4.1 BUN 27 creatinine 0.93.   INR-1.96   .   05/13/2013.   WBC 8.3 hemoglobin 8.7 platelets 149.   Sodium 140 potassium 3.9 BUN 26 creatinine 0.91.   05/10/2013.   Sodium 140 potassium 4.2 BUN 21 creatinine 0.78.   05/06/2013.   Liver function tests within normal limits except alkaline phosphatase mildly elevated at 144 an albumin at 3.3.   Urine test 2014.   WBC 13.0 hemoglobin 9.4 platelets 118   .   Assessment and plan.       1 atrial fibrillation this appears to be rate controlled again she is on anticoagulation-she is on diltiazem this appears to be stable at this point. --INR recently was slightly supratherapeutic at her dose has  been reduced update INR is pending   [id number] of diastolic CHF-she does continue on Lasix--- weight appears to be stable at 319-Lasix was reduced to 20 mg twice a day at one point secondary to an creasing creatinine-this has been increased back to 40 twice a day secondary to weight gain appears to have a beneficial effect we will have to watch her metabolic panel closely will check a BMP-clinically this appears to be stable     l.   3-anemia-this was thought to be blood loss-CBC shows hemoglobin is relatively stable from discharge hemoglobin-she has been started on iron-there is some history apparently of occult positive stool I suspect this will need GI followup which apparently was scheduledbefore her fall-so far for occult stools have been negative for blood-hemoglobin has been stable-iron studies were done which showed an iron of 35 total iron binding capacity of 316 and ferritin of 140-will check CBC int 2 weeks  #4-diabetes type 2-she is on Lantus 15 units as well as Glucophage-her sugars appear to be quite satisfactory here largely in the low to mid 100s    [id number]-of note rater than 30 minutes spent assessing patient and formulating a plan of care for numerous diagnoses

## 2013-07-07 LAB — GLUCOSE, CAPILLARY: Glucose-Capillary: 120 mg/dL — ABNORMAL HIGH (ref 70–99)

## 2013-07-08 ENCOUNTER — Other Ambulatory Visit: Payer: Self-pay | Admitting: *Deleted

## 2013-07-08 LAB — GLUCOSE, CAPILLARY: Glucose-Capillary: 200 mg/dL — ABNORMAL HIGH (ref 70–99)

## 2013-07-08 MED ORDER — LISINOPRIL 20 MG PO TABS: 20.0000 mg | ORAL_TABLET | Freq: Every day | ORAL | Status: DC

## 2013-07-09 LAB — GLUCOSE, CAPILLARY: Glucose-Capillary: 116 mg/dL — ABNORMAL HIGH (ref 70–99)

## 2013-07-10 LAB — GLUCOSE, CAPILLARY
Glucose-Capillary: 132 mg/dL — ABNORMAL HIGH (ref 70–99)
Glucose-Capillary: 145 mg/dL — ABNORMAL HIGH (ref 70–99)

## 2013-07-11 LAB — GLUCOSE, CAPILLARY: Glucose-Capillary: 114 mg/dL — ABNORMAL HIGH (ref 70–99)

## 2013-07-14 LAB — GLUCOSE, CAPILLARY: Glucose-Capillary: 175 mg/dL — ABNORMAL HIGH (ref 70–99)

## 2013-07-15 LAB — GLUCOSE, CAPILLARY
Glucose-Capillary: 115 mg/dL — ABNORMAL HIGH (ref 70–99)
Glucose-Capillary: 147 mg/dL — ABNORMAL HIGH (ref 70–99)

## 2013-07-16 LAB — GLUCOSE, CAPILLARY
Glucose-Capillary: 154 mg/dL — ABNORMAL HIGH (ref 70–99)
Glucose-Capillary: 154 mg/dL — ABNORMAL HIGH (ref 70–99)

## 2013-07-19 LAB — GLUCOSE, CAPILLARY
Glucose-Capillary: 138 mg/dL — ABNORMAL HIGH (ref 70–99)
Glucose-Capillary: 176 mg/dL — ABNORMAL HIGH (ref 70–99)

## 2013-07-20 LAB — GLUCOSE, CAPILLARY: Glucose-Capillary: 124 mg/dL — ABNORMAL HIGH (ref 70–99)

## 2013-07-21 LAB — GLUCOSE, CAPILLARY: Glucose-Capillary: 133 mg/dL — ABNORMAL HIGH (ref 70–99)

## 2013-07-22 LAB — GLUCOSE, CAPILLARY: Glucose-Capillary: 121 mg/dL — ABNORMAL HIGH (ref 70–99)

## 2013-07-23 LAB — GLUCOSE, CAPILLARY
Glucose-Capillary: 129 mg/dL — ABNORMAL HIGH (ref 70–99)
Glucose-Capillary: 191 mg/dL — ABNORMAL HIGH (ref 70–99)

## 2013-07-24 LAB — GLUCOSE, CAPILLARY: Glucose-Capillary: 139 mg/dL — ABNORMAL HIGH (ref 70–99)

## 2013-07-25 LAB — GLUCOSE, CAPILLARY
Glucose-Capillary: 131 mg/dL — ABNORMAL HIGH (ref 70–99)
Glucose-Capillary: 141 mg/dL — ABNORMAL HIGH (ref 70–99)

## 2013-07-27 LAB — GLUCOSE, CAPILLARY
Glucose-Capillary: 138 mg/dL — ABNORMAL HIGH (ref 70–99)
Glucose-Capillary: 143 mg/dL — ABNORMAL HIGH (ref 70–99)

## 2013-07-29 LAB — GLUCOSE, CAPILLARY: Glucose-Capillary: 139 mg/dL — ABNORMAL HIGH (ref 70–99)

## 2013-07-31 LAB — GLUCOSE, CAPILLARY
Glucose-Capillary: 145 mg/dL — ABNORMAL HIGH (ref 70–99)
Glucose-Capillary: 132 mg/dL — ABNORMAL HIGH (ref 70–99)

## 2013-08-01 LAB — GLUCOSE, CAPILLARY: Glucose-Capillary: 154 mg/dL — ABNORMAL HIGH (ref 70–99)

## 2013-08-03 LAB — GLUCOSE, CAPILLARY: Glucose-Capillary: 136 mg/dL — ABNORMAL HIGH (ref 70–99)

## 2013-08-04 LAB — GLUCOSE, CAPILLARY: Glucose-Capillary: 178 mg/dL — ABNORMAL HIGH (ref 70–99)

## 2013-08-05 ENCOUNTER — Non-Acute Institutional Stay (SKILLED_NURSING_FACILITY): Payer: Medicare HMO | Admitting: Internal Medicine

## 2013-08-05 DIAGNOSIS — D649 Anemia, unspecified: Secondary | ICD-10-CM

## 2013-08-05 DIAGNOSIS — E119 Type 2 diabetes mellitus without complications: Secondary | ICD-10-CM

## 2013-08-05 DIAGNOSIS — IMO0001 Reserved for inherently not codable concepts without codable children: Secondary | ICD-10-CM

## 2013-08-05 DIAGNOSIS — I4891 Unspecified atrial fibrillation: Secondary | ICD-10-CM

## 2013-08-05 DIAGNOSIS — IMO0002 Reserved for concepts with insufficient information to code with codable children: Secondary | ICD-10-CM

## 2013-08-05 DIAGNOSIS — I509 Heart failure, unspecified: Secondary | ICD-10-CM

## 2013-08-05 DIAGNOSIS — D500 Iron deficiency anemia secondary to blood loss (chronic): Secondary | ICD-10-CM

## 2013-08-05 DIAGNOSIS — S72002P Fracture of unspecified part of neck of left femur, subsequent encounter for closed fracture with malunion: Secondary | ICD-10-CM

## 2013-08-05 DIAGNOSIS — R030 Elevated blood-pressure reading, without diagnosis of hypertension: Secondary | ICD-10-CM

## 2013-08-05 LAB — GLUCOSE, CAPILLARY: Glucose-Capillary: 125 mg/dL — ABNORMAL HIGH (ref 70–99)

## 2013-08-05 NOTE — Progress Notes (Signed)
Patient ID: [name], female   DOB: Oct 21, 1946, 67 y.o.   MRN: [id number]         This is a routine  visit.   Facility [facility].   Level of care skilled.   Chief complaint-medical management of anemia atrial fibrillation diabetes type 2-diastolic CHF-history of left hip fracture   History of present illness.   Patient is a pleasant 67 year old female who sustained a left hip fracture apparently after sustaining a fall at home.   This was complicated with a history of morbid obesity as well as atrial fibrillation with a supratherapeutic INR of 3.51.   She also has a history of diastolic CHF.   She did undergo an open reduction internal fixation-apparently this went well and she is here for therapy.   In regards to atrial fibrillation she is on chronic Coumadin INR was elevated on hospital admission this was reversed with vitamin K--and this has normalized during her stay here an update INR is pending.       She does have a history of diastolic CHF she did require a dose of IV Lasix on admission to the hospital-her home dose of Lasix was resumed postoperatively.--This has been increased recently by Dr. Dellia Nims to 40 mg twice a day and this appears to be relatively stable   He does have a history of diabetes most recent hemoglobin A1c was 5.9.--Sugars run low---mid 100s she is on Lantus 15 units a day     She had been on Levemir 43 units this was decreased to Lantus 15 units secondary to a low hemoglobin A1c.  .   She does have a history of obstructive sleep apnea  she does use CPAP.   She also has normocytic anemia thought to be due to blood loss from the procedure-however speaking with her apparently she did have a GI consult pending for some history of occult positive stools   During her stay here her hemoglobin has dropped some now hovering in the low 8 range so far her Hemoccult stools have been negative and iron was increased earlier this week.  She  does not appear to be overtly symptomatic.   Previous medical history.   Left hip fracture status post repair.   Atrial fibrillation.   Diastolic CHF.   Obstructive sleep apnea.   Diabetes.   Hypertension.   Anemia-normocytic.   Thrombocytopenia.   COPD.   GERD. Coronary artery disease.   Hyperlipidemia.   Previous history of cataract surgery.   Previous surgical history.   Cataract extraction with intraocular lens implant bilateral.   Appendectomy.   Cholecystectomy.   Carpal tunnel release right hand.   Cervical spine surgery-fusion.   History of cesarean section.   Umbilical hernial repair.   Left knee arthroscopically.   Of note she did have a colonoscopy 12/27/2004 that was normal.   She had an upper GI endoscopy on December 2003-which was normal.   Hospital studies.   05/06/2013-chest x-ray-showed no displaced fractures-vascular congestion and cardiomegaly.   HEENT 6 2014 elbow x-ray-showed no evidence of fracture or dislocation.   05/08/2013.   Postop x-ray of left hip showed near anatomic alignment after ORIF of the proximal left femur fracture.   01/07/2013.   X-ray left shoulder-did not show any acute process.   Medications.--Reviewed per MAR    .   Marland Kitchen       Social history.   Patient does live with her daughter and son-in-law and grandson child-denies any smoking history  or alcohol or illicit drug use apparently she ambulated at home with a cane.   Family history.   Mother did have a history of MI and colon cancer as well as. Diabetes   Father with a history of Alzheimer's dementia.   Does have a sister with a history of breast cancer.   Review of systems.   General denies any fever or chills.   Head ears eyes nose mouth and throat-does not complaining of any visual changes nasal discharge or sore throat.   Skin-does not complaining of any issues    Henrene Pastor  respiratory-denies any shortness of breath or cough.   Does have a significant history of COPD.   Cardiac-does not complaining of any chest pain or palpitations has significant lymphedema which is baseline.   GI-does not complaining of any nausea vomiting diarrhea constipation or abdominal pain.   GU-denies dysuria.   Muscle skeletal-has significant weakness complicated with morbid obesity-she is not complaining of any joint pain says the hip pain is controlled with current medications.   Neurologic denies any headache dizziness does not complaining of numbness or tingling.   Psych-does not complaining of depression or anxiety.   Physical exam.   Temperature is 98.2 pulse 70 respirations 22 blood pressure 136/73 weight is 311.5 this appears to be a loss of about 14 pounds since her admission about a month ago   In general this is a very pleasant morbidly obese female in no distress.   . Skin is warm and dry.   Eyes pupils appear equal round react to light sclera and conjunctiva are clear.   Oropharynx is clear mucous membranes moist.   .   Chest is clear to auscultation without rhonchi rales or wheezes.   Heart is regular rate and rhythm with an occasional irregular beat-- without murmur gallop or rub.   Abdomen is morbidly obese soft nontender with slightly hyperactive bowel sounds. this could be due to the obesity.   GU is within normal limits no drainage bleeding or discharge or rash noted.   Musculoskeletal -grip strength appears to be intact bilaterally she does have significant lymphedema of her lower extremities I would say 2+ --moves all extremities at baseline with limited range of motion of her legs I suspect secondary to significant lymphedema.   Neurologic is grossly intact no lateralizing findings cranial nerves are intact her speech is clear.   Psych- is alert and oriented x3 pleasant and appropriate   . Labs.  08/05/2013.  WBC 5.1  hemoglobin 8.1 platelets 143.  08/01/2013.  INR-2.42.  07/29/2013.  WBC 5.1 hemoglobin 8.0 platelets 158.  Iron 26-total iron binding capacity 271.  Vitamin B12-275.  Folate 10.9.  Ferritin-96.    07/20/2013.  CBC 5.3 hemoglobin 8.4 platelets 151.  07/13/2013.  Sodium 141 potassium 4 BUN 24 creatinine 1.03.  07/01/2013.  BNP-2004     05/13/2013.   WBC 8.3 hemoglobin 8.7 platelets 149.   Sodium 140 potassium 3.9 BUN 26 creatinine 0.91.       05/10/2013.   Sodium 140 potassium 4.2 BUN 21 creatinine 0.78.   05/06/2013.   Liver function tests within normal limits except alkaline phosphatase mildly elevated at 144 an albumin at 3.3.   Urine test 2014.   WBC 13.0 hemoglobin 9.4 platelets 118.   Assessment and plan.   #1-history of left hip fracture with repair-this appears to be stable she will need continued therapy and followup by orthopedics she says her pain is controlled on Percocet-she is  on Coumadin for atrial fibrillation as well as DVT prophylaxis -update INR is pending continue on Fosamax as well   2 atrial fibrillation this appears to be rate controlled again she is on anticoagulation-she is on diltiazem this appears to be stable at this point.   [id number] of diastolic CHF-she does continue on Lasix--this appears to be --Will update metabolic panel   [id number] of diabetes-continues on Glucophage as well as Lantus and sliding scale we'll continue to monitor sofar CBGs in the mid to low 100s generally-hemoglobin A1c was kind of low in hospital -- basal insulin was reduced   #5-hypertension-at this point appear stable she is on diltiazem as well as lisinopril.   6-anemia-this was thought to be blood loss-her hemoglobin has ranged between 8-9 during her stay here-did increase her iron to twice a day yesterday-update CBC next week-so far her occult stools have been negative for bleeding  I suspect this will need GI  followup  which apparently was scheduledbefore her fall.    #7 as COPD-continue on oxygen this has been stable she also has CPAP for history of obstructive sleep apnea.   #8-thrombocytopenia-this was thought secondary to surgical blood loss-will repeat CBC -this appears to be fairly mild looking at the labs  obtained since her admission here.   #9-morbid obesity--actually had lost lost some weight during her stay here I suspect this is desired  #10 -- hyperlipidemia-she is on Zocor-update liver function tests as well as lipid panel    [phone]

## 2013-08-06 LAB — GLUCOSE, CAPILLARY: Glucose-Capillary: 188 mg/dL — ABNORMAL HIGH (ref 70–99)

## 2013-08-08 LAB — GLUCOSE, CAPILLARY

## 2013-08-10 LAB — GLUCOSE, CAPILLARY
Glucose-Capillary: 159 mg/dL — ABNORMAL HIGH (ref 70–99)
Glucose-Capillary: 148 mg/dL — ABNORMAL HIGH (ref 70–99)

## 2013-08-11 LAB — GLUCOSE, CAPILLARY
Glucose-Capillary: 152 mg/dL — ABNORMAL HIGH (ref 70–99)
Glucose-Capillary: 175 mg/dL — ABNORMAL HIGH (ref 70–99)

## 2013-08-13 LAB — GLUCOSE, CAPILLARY: Glucose-Capillary: 128 mg/dL — ABNORMAL HIGH (ref 70–99)

## 2013-08-14 LAB — GLUCOSE, CAPILLARY: Glucose-Capillary: 180 mg/dL — ABNORMAL HIGH (ref 70–99)

## 2013-08-16 LAB — GLUCOSE, CAPILLARY: Glucose-Capillary: 132 mg/dL — ABNORMAL HIGH (ref 70–99)

## 2013-08-17 ENCOUNTER — Non-Acute Institutional Stay (SKILLED_NURSING_FACILITY): Payer: Medicare HMO | Admitting: Internal Medicine

## 2013-08-17 DIAGNOSIS — I4891 Unspecified atrial fibrillation: Secondary | ICD-10-CM

## 2013-08-17 DIAGNOSIS — J449 Chronic obstructive pulmonary disease, unspecified: Secondary | ICD-10-CM

## 2013-08-17 DIAGNOSIS — I509 Heart failure, unspecified: Secondary | ICD-10-CM

## 2013-08-17 LAB — GLUCOSE, CAPILLARY: Glucose-Capillary: 160 mg/dL — ABNORMAL HIGH (ref 70–99)

## 2013-08-19 ENCOUNTER — Non-Acute Institutional Stay (SKILLED_NURSING_FACILITY): Payer: Medicare HMO | Admitting: Internal Medicine

## 2013-08-19 DIAGNOSIS — I509 Heart failure, unspecified: Secondary | ICD-10-CM

## 2013-08-19 DIAGNOSIS — N289 Disorder of kidney and ureter, unspecified: Secondary | ICD-10-CM

## 2013-08-19 DIAGNOSIS — E119 Type 2 diabetes mellitus without complications: Secondary | ICD-10-CM

## 2013-08-19 DIAGNOSIS — D649 Anemia, unspecified: Secondary | ICD-10-CM

## 2013-08-19 DIAGNOSIS — R030 Elevated blood-pressure reading, without diagnosis of hypertension: Secondary | ICD-10-CM

## 2013-08-19 DIAGNOSIS — S72002D Fracture of unspecified part of neck of left femur, subsequent encounter for closed fracture with routine healing: Secondary | ICD-10-CM

## 2013-08-19 DIAGNOSIS — I4891 Unspecified atrial fibrillation: Secondary | ICD-10-CM

## 2013-08-19 DIAGNOSIS — S72009D Fracture of unspecified part of neck of unspecified femur, subsequent encounter for closed fracture with routine healing: Secondary | ICD-10-CM

## 2013-08-19 LAB — GLUCOSE, CAPILLARY
Glucose-Capillary: 169 mg/dL — ABNORMAL HIGH (ref 70–99)
Glucose-Capillary: 191 mg/dL — ABNORMAL HIGH (ref 70–99)

## 2013-08-19 NOTE — Progress Notes (Signed)
Patient ID: [name], female   DOB: 11/24/1946, 67 y.o.   MRN: [id number]  This is an acute visit.  Level of care skilled.  Facility [facility]  Chief complaint-acute visit followup anemia prior to discharge--also renal insufficiency.  [name] is.  Patient is a very pleasant elderly resident with a history of a left hip fracture this was complicated with a history of morbid obesity as well as atrial fibrillation.  She underwent an ORIF and apparently went well.  She continues on Coumadin for a history of chronic atrial fibrillation INR done today was 2.71 she is on 5 mg of Coumadin-it appears this has been stable for some time on the same dose she will need a repeat  next week by home health.  Regards to other issues they appear to be stable she does have a history of anemia hemoglobin has run between 8-9 and that is the result of today's lab with a hemoglobin of 8.4.  Of note her creatinine has risen a bit to 1.33 --appears  her baseline is more in the 1.1-1.2 area-apparently she is eating and drinking well and appears to be clinically stable in this regard  She is on Lasix with a history of diastolic CH F. and did receive actually IV Lasix in the hospital.  Her current Lasix dose is 40 mg twice a day-her weights appear to be stable she initially gained some weight but [name] did increase her Lasix and her weight has returned more to her baseline around 316 pounds.  She denies any shortness of breath or chest pain clinically appears to be stable.  Family medical social history as been reviewed per progress note on 08/07/2013.  Medications have been reviewed per MAR.  Review of systems.  In general denies any fever or chills.  Respiratory no complaints of shortness of breath or cough.  Cardiac no complaints of chest pain.  GI no complaints of abdominal pain nausea vomiting diarrhea constipation says she eats and drinks well.  Muscle skeletal has gained some strength  during her stay here does not complaining of joint pain again this has not really been a big issue during her stay here.  Neurologic no complaints of headache or dizziness.  Psyche  a very pleasant patient who appears to interact very well with staff as well as a roommate.  Physical exam.  Temperature is 98.2 pulse 67 respirations 20 blood pressure 120/87 weight is 316.  In general this is a very pleasant morbidly obese female in no distress sitting comfortably in her wheelchair.  Her skin is warm and dry I do not note any bruising or bleeding.  Chest is clear to auscultation without rhonchi rales or wheezes.  Heart-regular  rate and rhythm with an occasional irregular beat without murmur gallop or rub.  Abdomen is morbidly obese with positive bowel sounds no tenderness.  Muscle skeletal this appears to be at baseline moves all extremities x4 again her ambulation is hindered by her morbid obesity--he continues with a large amount of lymphedema .  Neurologic is grossly intact her speech is clear no lateralizing findings.  Psych she is alert and oriented x3.  Labs.  08/19/2013.  WBC 5.6 hemoglobin 8.4 platelets 150.  INR-2.71.  Sodium 141 potassium 4.4 BUN 33 creatinine 1.33  07/29/2013.  Iron 26.  Total iron binding capacity 245.  Vitamin B12 275.  Folate-10.9.  Ferritin 96.      Assessment and plan  #1-anemia-this appears to be at baseline she  is on iron twice a day-she had negative occult stools.  She has had GI followup scheduled apparently this was complicated by the fracture-will write an order to see if this can be rearranged upon her discharge-- Suspect there is an element of chronic disease   #2-diastolic CHF-this appears to be at baseline-do note her creatinine has risen some this will need followup will  Hold  1 dose of Lasix today however would not want to be too aggressive in this regard secondary to her  Stability---  will resume twice a day  dosing tomorrow and have a check by home health  Of   BMP next week when INR is drawn -- notify primary care provider of those results  #3-atrial fibrillation-this appears rate controlled she is on Cardizem as well as Coumadin-INR will need to be checked next week by home health as stated above  #4-renal insufficiency-as stated above.  #5-hypertension-this appears stable she is on Cardizem as well as lisinopril  .-  -.  6-history of left hip fracture this appears to be healing  fairly unremarkable  again this is complicated with a history of morbid obesity will need continued PT and OT-she is on Fosamax  #7-. Diabetes type 2-she is on Lantus as well as Glucophage-blood sugars appear to be consistently in the low-mid to high 100s  [id number]-of note greater than 30 minutes spent assessing patient-and formulating coordinating plan of care for numerous diagnoses.  Marland Kitchen

## 2013-08-23 ENCOUNTER — Ambulatory Visit (INDEPENDENT_AMBULATORY_CARE_PROVIDER_SITE_OTHER): Payer: Medicare HMO | Admitting: General Practice

## 2013-08-23 ENCOUNTER — Encounter: Payer: Self-pay | Admitting: General Practice

## 2013-08-23 VITALS — BP 116/62 | HR 72 | Temp 97.0°F | Ht 66.0 in | Wt 308.0 lb

## 2013-08-23 DIAGNOSIS — E119 Type 2 diabetes mellitus without complications: Secondary | ICD-10-CM

## 2013-08-23 DIAGNOSIS — S72009D Fracture of unspecified part of neck of unspecified femur, subsequent encounter for closed fracture with routine healing: Secondary | ICD-10-CM

## 2013-08-23 DIAGNOSIS — Z09 Encounter for follow-up examination after completed treatment for conditions other than malignant neoplasm: Secondary | ICD-10-CM

## 2013-08-23 DIAGNOSIS — I2510 Atherosclerotic heart disease of native coronary artery without angina pectoris: Secondary | ICD-10-CM

## 2013-08-23 DIAGNOSIS — I509 Heart failure, unspecified: Secondary | ICD-10-CM

## 2013-08-23 DIAGNOSIS — I4891 Unspecified atrial fibrillation: Secondary | ICD-10-CM

## 2013-08-23 DIAGNOSIS — Z23 Encounter for immunization: Secondary | ICD-10-CM

## 2013-08-23 NOTE — Patient Instructions (Addendum)
Anticoagulation Dose Instructions as of 08/23/2013     Dorene Grebe Tue Wed Thu Fri Sat   New Dose 5 mg 5 mg 5 mg 5 mg 5 mg 5 mg 5 mg    Description       Continue same

## 2013-08-23 NOTE — Progress Notes (Signed)
  Subjective:    Patient ID: [name], female    DOB: 1946/01/06, 67 y.o.   MRN: [id number]  HPI Patient presents today for hospital follow up. Reports she had fallen 14 weeks ago and fractured left hip, recently discharged from [facility] for rehabilitation. She denies concerns or complications at this time. Reports taking medications as prescribed.     Review of Systems  Constitutional: Negative for fever and chills.  HENT: Negative for neck pain and neck stiffness.   Respiratory: Negative for chest tightness and shortness of breath.   Cardiovascular: Negative for chest pain and palpitations.  Musculoskeletal:       Periodic right hip pain  Neurological: Negative for dizziness, weakness and headaches.       Objective:   Physical Exam  Constitutional: She is oriented to person, place, and time. She appears well-developed and well-nourished.  HENT:  Head: Normocephalic and atraumatic.  Right Ear: External ear normal.  Left Ear: External ear normal.  Mouth/Throat: Oropharynx is clear and moist.  Eyes: EOM are normal. Pupils are equal, round, and reactive to light.  Neck: Normal range of motion.  Cardiovascular: Normal rate, regular rhythm and normal heart sounds.   Pulmonary/Chest: No respiratory distress. She exhibits no tenderness.  Decreased breath sounds throughout  Abdominal: Soft. Bowel sounds are normal. She exhibits no distension. There is no tenderness.  Neurological: She is alert and oriented to person, place, and time.  Skin: Skin is warm and dry.  Psychiatric: She has a normal mood and affect.          Assessment & Plan:  1. Atrial fibrillation - POCT INR  2. Hospital discharge follow-up -RTO if symptoms develop  3. Need for prophylactic vaccination and inoculation against influenza Schedule follow up for chronic health follow up in 1 month Patient and daughter verbalized understanding Erby Pian, FNP-C

## 2013-08-24 NOTE — Progress Notes (Signed)
Patient ID: [name], female   DOB: 12-04-1945, 67 y.o.   MRN: [id number]           PROGRESS NOTE  DATE:  08/17/2013  FACILITY: [facility]   LEVEL OF CARE:   SNF   Acute Visit/Discharge Visit   CHIEF COMPLAINT:  Review of medical issues prior to discharge.    HISTORY OF PRESENT ILLNESS:  This is a patient who came here after a left hip fracture some months ago, I believe in June.     She is a type 2 diabetic, on insulin.   Her blood sugars have been stable in the 200 range.    She also has a history of atrial fibrillation, on chronic Coumadin.    COPD.    Obstructive sleep apnea with nightly CPAP and on chronic oxygen.    She also has diastolically-mediated heart failure.  She takes Lasix 40 mg b.i.d.    She is going home to her own home in [place].  She has family assistance at home.  She is using a walker and ambulating well.    PHYSICAL EXAMINATION:   GENERAL APPEARANCE:  She does not look to be in any distress.   CHEST/RESPIRATORY:  Shallow, but otherwise clear air entry.   CARDIOVASCULAR:  CARDIAC:  No signs of congestive heart failure.  Heart sounds are regular.  JVP not elevated.  No S3.   CIRCULATION:   EDEMA/VARICOSITIES:  Extremities:  She has severe bilateral lymphedema and venous stasis.   This probably could benefit from graded pressure stockings if she would be compliant with them.  She has had wound issues in the past.  I am not sure of the frequency of this.    ASSESSMENT/PLAN:  Chronic atrial fibrillation.  Rate is controlled.  I will check a PT and INR before she leaves.    Diastolic congestive heart failure.  We will check her BMP, on Lasix.    Anemia.  We will check a CBC prior to her discharge, which is in four days' time.    She will need a walker.  She already has her chronic oxygen.  Probably PT and OT and skilled nursing in the home.

## 2013-08-26 DIAGNOSIS — I5032 Chronic diastolic (congestive) heart failure: Secondary | ICD-10-CM | POA: Insufficient documentation

## 2013-08-26 DIAGNOSIS — I509 Heart failure, unspecified: Secondary | ICD-10-CM | POA: Insufficient documentation

## 2013-09-12 ENCOUNTER — Encounter: Payer: Self-pay | Admitting: Internal Medicine

## 2013-09-14 ENCOUNTER — Other Ambulatory Visit: Payer: Self-pay | Admitting: Nurse Practitioner

## 2013-09-15 ENCOUNTER — Other Ambulatory Visit: Payer: Self-pay | Admitting: Nurse Practitioner

## 2013-09-22 ENCOUNTER — Encounter: Payer: Self-pay | Admitting: General Practice

## 2013-09-22 ENCOUNTER — Encounter (INDEPENDENT_AMBULATORY_CARE_PROVIDER_SITE_OTHER): Payer: Self-pay

## 2013-09-22 ENCOUNTER — Ambulatory Visit (INDEPENDENT_AMBULATORY_CARE_PROVIDER_SITE_OTHER): Payer: Medicare HMO | Admitting: General Practice

## 2013-09-22 VITALS — BP 184/73 | HR 90 | Temp 97.0°F | Ht 66.0 in | Wt 303.0 lb

## 2013-09-22 DIAGNOSIS — E785 Hyperlipidemia, unspecified: Secondary | ICD-10-CM

## 2013-09-22 DIAGNOSIS — I10 Essential (primary) hypertension: Secondary | ICD-10-CM

## 2013-09-22 DIAGNOSIS — I872 Venous insufficiency (chronic) (peripheral): Secondary | ICD-10-CM

## 2013-09-22 DIAGNOSIS — I4891 Unspecified atrial fibrillation: Secondary | ICD-10-CM

## 2013-09-22 DIAGNOSIS — E119 Type 2 diabetes mellitus without complications: Secondary | ICD-10-CM

## 2013-09-22 DIAGNOSIS — Z09 Encounter for follow-up examination after completed treatment for conditions other than malignant neoplasm: Secondary | ICD-10-CM

## 2013-09-22 DIAGNOSIS — Z7901 Long term (current) use of anticoagulants: Secondary | ICD-10-CM

## 2013-09-22 DIAGNOSIS — R609 Edema, unspecified: Secondary | ICD-10-CM

## 2013-09-22 LAB — POCT CBC
Granulocyte percent: 81.1 %G — AB (ref 37–80)
Lymph, poc: 1.2 (ref 0.6–3.4)
MCH, POC: 26.1 pg — AB (ref 27–31.2)
MCHC: 32 g/dL (ref 31.8–35.4)
MPV: 8.6 fL (ref 0–99.8)
Platelet Count, POC: 160 10*3/uL (ref 142–424)
RBC: 3.6 M/uL — AB (ref 4.04–5.48)
RDW, POC: 19 %
WBC: 8.3 10*3/uL (ref 4.6–10.2)

## 2013-09-22 NOTE — Patient Instructions (Signed)

## 2013-09-22 NOTE — Progress Notes (Signed)
  Subjective:    Patient ID: [name], female    DOB: [birth year], 67 y.o.   MRN: [id number]  HPI Patient presents today for chronic health follow up. She has a history of osteoporosis, hyperlipidemia, diabetes, hypertension, chf, atrail fibrillation. She is oxygen dependant. She reports taking medications as prescribed. Reports blood pressure being taken daily and ranges 120-141/60-85. Reports blood sugars are checked at least daily and ranges 120's-170's. Reports her daily weight ranges 302 pounds. She reports swelling in lower legs, but denies increased shortness of breath.    Review of Systems  Constitutional: Negative for fever and chills.  Respiratory: Negative for cough, chest tightness and shortness of breath.   Cardiovascular: Positive for leg swelling. Negative for chest pain and palpitations.       Both lower legs swelling    Gastrointestinal: Negative for nausea, vomiting, abdominal pain, diarrhea, constipation and blood in stool.  Genitourinary: Negative for dysuria, hematuria and difficulty urinating.  Musculoskeletal: Negative for neck pain and neck stiffness.       Periodic right hip pain and knee pain, previous surgery  Skin: Negative for rash and wound.  Neurological: Negative for dizziness, weakness and headaches.       Objective:   Physical Exam  Constitutional: She is oriented to person, place, and time. She appears well-developed and well-nourished.  HENT:  Head: Normocephalic and atraumatic.  Right Ear: External ear normal.  Left Ear: External ear normal.  Mouth/Throat: Oropharynx is clear and moist.  Eyes: EOM are normal. Pupils are equal, round, and reactive to light.  Neck: Normal range of motion.  Cardiovascular: Normal rate, regular rhythm and normal heart sounds.   3+ non pitting edema to bilateral lower extremities  Pulmonary/Chest: No respiratory distress. She exhibits no tenderness.  Decreased breath sounds throughout  Abdominal: Soft. Bowel  sounds are normal. She exhibits no distension. There is no tenderness.  Neurological: She is alert and oriented to person, place, and time.  Skin: Skin is warm and dry.  Psychiatric: She has a normal mood and affect.          Assessment & Plan:  1. Hypertension  - CMP14+EGFR  2. Hyperlipidemia  - NMR, lipoprofile  3. Diabetes  - POCT glycosylated hemoglobin (Hb A1C)  4. Follow-up exam, 3-6 months since previous exam  - POCT CBC  5. Warfarin anticoagulation   6. Atrial fibrillation  - POCT INR  7. Peripheral edema -unna boots applied to bilateral lower legs -Continue all current medications Labs pending, cmp, lipid panel F/u in 3 months for chronic health check up RTO on Monday for evaluation of peripheral edema Discussed exercise and diet  Patient verbalized understanding Erby Pian, FNP-C

## 2013-09-24 LAB — CMP14+EGFR
ALT: 19 IU/L (ref 0–32)
AST: 20 IU/L (ref 0–40)
Alkaline Phosphatase: 199 IU/L — ABNORMAL HIGH (ref 39–117)
CO2: 27 mmol/L (ref 18–29)
Calcium: 9.2 mg/dL (ref 8.6–10.2)
Chloride: 107 mmol/L (ref 97–108)
GFR calc Af Amer: 88 mL/min/{1.73_m2} (ref >59)
GFR calc non Af Amer: 77 mL/min/{1.73_m2} (ref >59)
Globulin, Total: 2.6 g/dL (ref 1.5–4.5)
Glucose: 127 mg/dL — ABNORMAL HIGH (ref 65–99)
Potassium: 4.4 mmol/L (ref 3.5–5.2)
Sodium: 146 mmol/L — ABNORMAL HIGH (ref 134–144)
Total Protein: 6.6 g/dL (ref 6.0–8.5)

## 2013-09-24 LAB — NMR, LIPOPROFILE
HDL Particle Number: 23.7 umol/L — ABNORMAL LOW (ref >=30.5)
LDL Size: 20.5 nm — ABNORMAL LOW (ref >20.5)
LDLC SERPL CALC-MCNC: 33 mg/dL (ref <100)
LP-IR Score: 29 (ref <=45)
Small LDL Particle Number: 196 nmol/L (ref <=527)

## 2013-09-26 ENCOUNTER — Telehealth: Payer: Self-pay | Admitting: General Practice

## 2013-09-26 ENCOUNTER — Ambulatory Visit (INDEPENDENT_AMBULATORY_CARE_PROVIDER_SITE_OTHER): Payer: Medicare HMO | Admitting: General Practice

## 2013-09-26 ENCOUNTER — Encounter: Payer: Self-pay | Admitting: General Practice

## 2013-09-26 ENCOUNTER — Other Ambulatory Visit: Payer: Self-pay | Admitting: General Practice

## 2013-09-26 ENCOUNTER — Telehealth: Payer: Self-pay | Admitting: *Deleted

## 2013-09-26 VITALS — BP 141/58 | HR 69 | Temp 96.7°F | Ht 66.0 in | Wt 303.0 lb

## 2013-09-26 DIAGNOSIS — E109 Type 1 diabetes mellitus without complications: Secondary | ICD-10-CM

## 2013-09-26 DIAGNOSIS — M25552 Pain in left hip: Secondary | ICD-10-CM

## 2013-09-26 DIAGNOSIS — M25559 Pain in unspecified hip: Secondary | ICD-10-CM

## 2013-09-26 DIAGNOSIS — R609 Edema, unspecified: Secondary | ICD-10-CM

## 2013-09-26 MED ORDER — INSULIN DETEMIR 100 UNIT/ML FLEXPEN: 43.0000 [IU] | PEN_INJECTOR | Freq: Two times a day (BID) | SUBCUTANEOUS | Status: DC

## 2013-09-26 NOTE — Telephone Encounter (Signed)
Please verify with pharmacy that she is taking levemir

## 2013-09-26 NOTE — Patient Instructions (Addendum)

## 2013-09-26 NOTE — Telephone Encounter (Signed)
Script sent to pharmacy.

## 2013-09-26 NOTE — Progress Notes (Signed)
  Subjective:    Patient ID: TERRI PELE, female    DOB: [birth year], 67 y.o.   MRN: [id number]  HPI Patient presents today for follow up of peripheral edema. She was placed in unna boots last visit and tolerated well. Unna boots removed during this visit. Patient reports inability to elevate legs in recliner due to chair being broken. Daughter reports she is going to pick up a new chair today. Reports left hip pain and discomfort after physical therapy and out of pain medication.     Review of Systems  Constitutional: Negative for fever and chills.  Respiratory: Positive for shortness of breath. Negative for chest tightness and wheezing.        Oxygen dependent  Cardiovascular: Positive for leg swelling. Negative for chest pain and palpitations.       Objective:   Physical Exam  Constitutional: She is oriented to person, place, and time. She appears well-developed and well-nourished.  Cardiovascular: Normal rate, regular rhythm and normal heart sounds.   Lymphedema to bilateral lower extremities  Pulmonary/Chest: No respiratory distress.  Sob with exertion  Neurological: She is alert and oriented to person, place, and time.  Skin: Skin is warm and dry.  Psychiatric: She has a normal mood and affect.          Assessment & Plan:  1. Left hip pain -contact ortho surgeon if still have uncontrolled pain, being several months out from surgery -may seek emergency medical treatment if symptoms worsen  2. Peripheral edema -resolved; although lymphedema noted -elevate lower extremities while sitting -seek medical attention if shortness of breath worsens from baseline -Patient and daughter verbalized understanding Erby Pian, FNP-C

## 2013-09-26 NOTE — Telephone Encounter (Signed)
Patient was on novolog and lantus in the hospital but went back to the levemir flex touch pen 43 units am and 43 units pm.  Needs new rx sent to [facility].

## 2013-09-26 NOTE — Telephone Encounter (Signed)
Duplicate message. 

## 2013-10-07 ENCOUNTER — Other Ambulatory Visit: Payer: Self-pay | Admitting: Cardiology

## 2013-10-10 ENCOUNTER — Ambulatory Visit: Payer: Medicare HMO | Admitting: General Practice

## 2013-10-12 ENCOUNTER — Encounter: Payer: Self-pay | Admitting: General Practice

## 2013-10-12 ENCOUNTER — Other Ambulatory Visit: Payer: Self-pay | Admitting: General Practice

## 2013-10-12 DIAGNOSIS — E109 Type 1 diabetes mellitus without complications: Secondary | ICD-10-CM

## 2013-10-12 MED ORDER — INSULIN DETEMIR 100 UNIT/ML FLEXPEN: 43.0000 [IU] | PEN_INJECTOR | Freq: Two times a day (BID) | SUBCUTANEOUS | Status: DC

## 2013-10-12 NOTE — Telephone Encounter (Signed)
Pt needs a letter stating she has heart trouble, a broken hip and is on oxygen fo [employer].

## 2013-10-12 NOTE — Telephone Encounter (Signed)
Script sent to pharmacy.

## 2013-10-12 NOTE — Telephone Encounter (Signed)
Please inform letter is ready for pick up

## 2013-10-13 NOTE — Telephone Encounter (Signed)
Pt aware. Rx sent in and letter ready to pick

## 2013-10-17 ENCOUNTER — Other Ambulatory Visit: Payer: Self-pay | Admitting: *Deleted

## 2013-10-17 MED ORDER — METFORMIN HCL 500 MG PO TABS: ORAL_TABLET | ORAL | Status: DC

## 2013-10-31 ENCOUNTER — Telehealth: Payer: Self-pay | Admitting: Pharmacist

## 2013-10-31 NOTE — Telephone Encounter (Signed)
Left message about need to have protime rechecked.  Appt for 11/03/13 at 10:30am

## 2013-11-03 ENCOUNTER — Ambulatory Visit (INDEPENDENT_AMBULATORY_CARE_PROVIDER_SITE_OTHER): Payer: Medicare HMO | Admitting: Pharmacist

## 2013-11-03 ENCOUNTER — Other Ambulatory Visit: Payer: Self-pay | Admitting: Nurse Practitioner

## 2013-11-03 DIAGNOSIS — I4891 Unspecified atrial fibrillation: Secondary | ICD-10-CM

## 2013-11-03 LAB — POCT INR: INR: 3.5

## 2013-11-03 MED ORDER — FREESTYLE SYSTEM KIT: PACK | Status: DC

## 2013-11-03 MED ORDER — BLOOD GLUCOSE TEST VI STRP: ORAL_STRIP | Status: DC

## 2013-11-03 NOTE — Patient Instructions (Signed)
Anticoagulation Dose Instructions as of 11/03/2013     Dorene Grebe Tue Wed Thu Fri Sat   New Dose 2.5 mg 5 mg 5 mg 5 mg 5 mg 5 mg 5 mg    Description       Hold tonight's dose (11/03/2013), then decrease to 1/2 tablet on Sunday and 1 tablet all other days.      INR was 3.5 today

## 2013-11-08 ENCOUNTER — Other Ambulatory Visit: Payer: Self-pay | Admitting: Family Medicine

## 2013-11-08 ENCOUNTER — Other Ambulatory Visit: Payer: Self-pay | Admitting: Cardiology

## 2013-11-17 ENCOUNTER — Ambulatory Visit (INDEPENDENT_AMBULATORY_CARE_PROVIDER_SITE_OTHER): Payer: Medicare HMO | Admitting: Pharmacist

## 2013-11-17 DIAGNOSIS — I4891 Unspecified atrial fibrillation: Secondary | ICD-10-CM

## 2013-11-17 LAB — POCT INR: INR: 2.6

## 2013-11-17 NOTE — Patient Instructions (Signed)
Anticoagulation Dose Instructions as of 11/17/2013     Dorene Grebe Tue Wed Thu Fri Sat   New Dose 2.5 mg 5 mg 5 mg 5 mg 5 mg 5 mg 5 mg    Description       Continue 1/2 tablet on Sunday and 1 tablet all other days.      INR was 2.6 today

## 2013-11-29 ENCOUNTER — Other Ambulatory Visit: Payer: Self-pay | Admitting: *Deleted

## 2013-11-29 MED ORDER — WARFARIN SODIUM 5 MG PO TABS: 5.0000 mg | ORAL_TABLET | ORAL | Status: DC

## 2013-12-13 ENCOUNTER — Telehealth: Payer: Self-pay | Admitting: General Practice

## 2013-12-23 ENCOUNTER — Ambulatory Visit (INDEPENDENT_AMBULATORY_CARE_PROVIDER_SITE_OTHER): Payer: Medicare HMO | Admitting: General Practice

## 2013-12-23 ENCOUNTER — Encounter: Payer: Self-pay | Admitting: General Practice

## 2013-12-23 VITALS — BP 176/74 | HR 90 | Temp 97.2°F | Ht 66.0 in | Wt 282.5 lb

## 2013-12-23 DIAGNOSIS — G589 Mononeuropathy, unspecified: Secondary | ICD-10-CM

## 2013-12-23 DIAGNOSIS — E785 Hyperlipidemia, unspecified: Secondary | ICD-10-CM

## 2013-12-23 DIAGNOSIS — I10 Essential (primary) hypertension: Secondary | ICD-10-CM

## 2013-12-23 DIAGNOSIS — E109 Type 1 diabetes mellitus without complications: Secondary | ICD-10-CM

## 2013-12-23 DIAGNOSIS — R32 Unspecified urinary incontinence: Secondary | ICD-10-CM

## 2013-12-23 DIAGNOSIS — G629 Polyneuropathy, unspecified: Secondary | ICD-10-CM

## 2013-12-23 DIAGNOSIS — E119 Type 2 diabetes mellitus without complications: Secondary | ICD-10-CM

## 2013-12-23 LAB — POCT GLYCOSYLATED HEMOGLOBIN (HGB A1C): Hemoglobin A1C: 5.6

## 2013-12-23 LAB — POCT URINALYSIS DIPSTICK
Bilirubin, UA: NEGATIVE
GLUCOSE UA: NEGATIVE
Ketones, UA: NEGATIVE
Leukocytes, UA: NEGATIVE
NITRITE UA: NEGATIVE
RBC UA: NEGATIVE
Spec Grav, UA: 1.01
UROBILINOGEN UA: NEGATIVE
pH, UA: 7

## 2013-12-23 LAB — POCT UA - MICROSCOPIC ONLY
Bacteria, U Microscopic: NEGATIVE
CRYSTALS, UR, HPF, POC: NEGATIVE
Casts, Ur, LPF, POC: NEGATIVE
EPITHELIAL CELLS, URINE PER MICROSCOPY: NEGATIVE
RBC, URINE, MICROSCOPIC: NEGATIVE
YEAST UA: NEGATIVE

## 2013-12-23 LAB — POCT UA - MICROALBUMIN: MICROALBUMIN (UR) POC: 100 mg/L

## 2013-12-23 MED ORDER — GABAPENTIN 100 MG PO CAPS: 100.0000 mg | ORAL_CAPSULE | Freq: Three times a day (TID) | ORAL | Status: DC

## 2013-12-23 NOTE — Progress Notes (Signed)
Subjective:    Patient ID: ZISSY HAMLETT, female    DOB: October 26, 1946, 68 y.o.   MRN: [id number]  HPI Patient presents today for chronic health follow up. History of osteoporosis, hyperlipidemia, diabetes, hypertension, chf, atrail fibrillation. She is oxygen dependant. She reports taking medications as prescribed. Reports blood pressure being taken daily and ranges 120-141/60-85. Reports blood sugars are checked at least daily and ranges 120's-170's. She reports eating a healthier diet and has lost weight (20 lbs).  Reports sensation of pins and needles pricking right leg and foot.    Review of Systems  Constitutional: Negative for fever and chills.  Respiratory: Positive for shortness of breath. Negative for cough and chest tightness.        Shortness of breath worse with exertion  Cardiovascular: Positive for leg swelling. Negative for chest pain and palpitations.       Chronic peripheral edema    Gastrointestinal: Negative for nausea, vomiting, abdominal pain, diarrhea, constipation and blood in stool.  Genitourinary: Negative for dysuria, hematuria and difficulty urinating.  Musculoskeletal: Negative for neck pain and neck stiffness.  Skin: Negative for rash and wound.  Neurological: Negative for dizziness, weakness and headaches.       Objective:   Physical Exam  Constitutional: She is oriented to person, place, and time. She appears well-developed and well-nourished.  HENT:  Head: Normocephalic and atraumatic.  Right Ear: External ear normal.  Left Ear: External ear normal.  Mouth/Throat: Oropharynx is clear and moist.  Eyes: EOM are normal. Pupils are equal, round, and reactive to light.  Neck: Normal range of motion.  Cardiovascular: Normal rate, regular rhythm and normal heart sounds.   2 non pitting edema to bilateral lower extremities  Pulmonary/Chest: No respiratory distress. She exhibits no tenderness.  Decreased breath sounds throughout  Abdominal: Soft. Bowel  sounds are normal. She exhibits no distension. There is no tenderness.  Neurological: She is alert and oriented to person, place, and time.  Skin: Skin is warm and dry.  Psychiatric: She has a normal mood and affect.      Results for orders placed in visit on 12/23/13  POCT URINALYSIS DIPSTICK      Result Value Range   Color, UA yellow     Clarity, UA cloudy     Glucose, UA neg     Bilirubin, UA neg     Ketones, UA neg     Spec Grav, UA 1.010     Blood, UA neg     pH, UA 7.0     Protein, UA 3+     Urobilinogen, UA negative     Nitrite, UA neg     Leukocytes, UA Negative    POCT UA - MICROSCOPIC ONLY      Result Value Range   WBC, Ur, HPF, POC 10-15     RBC, urine, microscopic neg     Bacteria, U Microscopic neg     Mucus, UA occ     Epithelial cells, urine per micros neg     Crystals, Ur, HPF, POC neg     Casts, Ur, LPF, POC neg     Yeast, UA neg    POCT UA - MICROALBUMIN      Result Value Range   Microalbumin Ur, POC 100         Assessment & Plan:  1. Incontinence  - POCT urinalysis dipstick - POCT UA - Microscopic Only  2. Diabetes  - POCT UA - Microalbumin - Microalbumin, urine  3. Neuropathy  - gabapentin (NEURONTIN) 100 MG capsule; Take 1 capsule (100 mg total) by mouth 3 (three) times daily.  Dispense: 90 capsule; Refill: 0  4. DM (diabetes mellitus), type 1  - POCT glycosylated hemoglobin (Hb A1C)  5. Hypertension  - CMP14+EGFR  6. Hyperlipidemia  - Lipid panel Continue all current medications Labs pending F/u in 3 months Discussed benefits of regular exercise and healthy eating Patient verbalized understanding Erby Pian, FNP-C

## 2013-12-23 NOTE — Patient Instructions (Signed)

## 2013-12-24 LAB — CMP14+EGFR
ALBUMIN: 4.3 g/dL (ref 3.6–4.8)
ALT: 14 IU/L (ref 0–32)
AST: 18 IU/L (ref 0–40)
Albumin/Globulin Ratio: 1.8 (ref 1.1–2.5)
Alkaline Phosphatase: 184 IU/L — ABNORMAL HIGH (ref 39–117)
BILIRUBIN TOTAL: 0.6 mg/dL (ref 0.0–1.2)
BUN/Creatinine Ratio: 26 (ref 11–26)
BUN: 19 mg/dL (ref 8–27)
CHLORIDE: 104 mmol/L (ref 97–108)
CO2: 26 mmol/L (ref 18–29)
Calcium: 9.2 mg/dL (ref 8.7–10.3)
Creatinine, Ser: 0.74 mg/dL (ref 0.57–1.00)
GFR calc non Af Amer: 83 mL/min/{1.73_m2} (ref >59)
GFR, EST AFRICAN AMERICAN: 96 mL/min/{1.73_m2} (ref >59)
Globulin, Total: 2.4 g/dL (ref 1.5–4.5)
Glucose: 115 mg/dL — ABNORMAL HIGH (ref 65–99)
Potassium: 4.7 mmol/L (ref 3.5–5.2)
Sodium: 145 mmol/L — ABNORMAL HIGH (ref 134–144)
Total Protein: 6.7 g/dL (ref 6.0–8.5)

## 2013-12-24 LAB — LIPID PANEL
CHOLESTEROL TOTAL: 115 mg/dL (ref 100–199)
Chol/HDL Ratio: 2.2 ratio units (ref 0.0–4.4)
HDL: 53 mg/dL (ref >39)
LDL CALC: 46 mg/dL (ref 0–99)
TRIGLYCERIDES: 80 mg/dL (ref 0–149)
VLDL Cholesterol Cal: 16 mg/dL (ref 5–40)

## 2013-12-24 LAB — MICROALBUMIN, URINE: Microalbumin, Urine: 178.3 ug/mL — ABNORMAL HIGH (ref 0.0–17.0)

## 2014-01-04 ENCOUNTER — Other Ambulatory Visit: Payer: Self-pay | Admitting: Cardiology

## 2014-01-12 ENCOUNTER — Ambulatory Visit (INDEPENDENT_AMBULATORY_CARE_PROVIDER_SITE_OTHER): Payer: Medicare HMO | Admitting: Pharmacist

## 2014-01-12 DIAGNOSIS — I4891 Unspecified atrial fibrillation: Secondary | ICD-10-CM

## 2014-01-12 LAB — POCT INR: INR: 3

## 2014-01-12 NOTE — Patient Instructions (Signed)
Anticoagulation Dose Instructions as of 01/12/2014     [name] Tue Wed Thu Fri Sat   New Dose 2.5 mg 5 mg 5 mg 5 mg 5 mg 5 mg 5 mg    Description       Hold for 1 day, the continue warfarin 1/2 tablet on Sunday and 1 tablet all other days.      INR was 3.0 today

## 2014-01-20 ENCOUNTER — Other Ambulatory Visit: Payer: Self-pay | Admitting: Family Medicine

## 2014-02-01 ENCOUNTER — Other Ambulatory Visit: Payer: Self-pay

## 2014-02-01 MED ORDER — LISINOPRIL 20 MG PO TABS: 20.0000 mg | ORAL_TABLET | Freq: Every day | ORAL | Status: DC

## 2014-02-03 ENCOUNTER — Ambulatory Visit: Payer: Medicare HMO | Admitting: General Practice

## 2014-02-06 ENCOUNTER — Ambulatory Visit: Payer: Medicare HMO | Admitting: General Practice

## 2014-02-07 ENCOUNTER — Other Ambulatory Visit: Payer: Self-pay

## 2014-02-07 MED ORDER — DILTIAZEM HCL ER 180 MG PO CP24: ORAL_CAPSULE | ORAL | Status: DC

## 2014-02-07 MED ORDER — POTASSIUM CHLORIDE CRYS ER 20 MEQ PO TBCR: EXTENDED_RELEASE_TABLET | ORAL | Status: DC

## 2014-02-09 ENCOUNTER — Encounter: Payer: Self-pay | Admitting: Nurse Practitioner

## 2014-02-09 ENCOUNTER — Ambulatory Visit (INDEPENDENT_AMBULATORY_CARE_PROVIDER_SITE_OTHER): Payer: Medicare HMO | Admitting: Pharmacist

## 2014-02-09 ENCOUNTER — Ambulatory Visit (INDEPENDENT_AMBULATORY_CARE_PROVIDER_SITE_OTHER): Payer: Medicare HMO | Admitting: Nurse Practitioner

## 2014-02-09 ENCOUNTER — Telehealth: Payer: Self-pay | Admitting: Nurse Practitioner

## 2014-02-09 VITALS — BP 180/72 | HR 69 | Temp 97.2°F | Ht 66.0 in

## 2014-02-09 DIAGNOSIS — I4891 Unspecified atrial fibrillation: Secondary | ICD-10-CM

## 2014-02-09 DIAGNOSIS — R635 Abnormal weight gain: Secondary | ICD-10-CM

## 2014-02-09 DIAGNOSIS — R609 Edema, unspecified: Secondary | ICD-10-CM

## 2014-02-09 DIAGNOSIS — R0602 Shortness of breath: Secondary | ICD-10-CM

## 2014-02-09 LAB — POCT INR: INR: 2.7

## 2014-02-09 MED ORDER — LISINOPRIL 40 MG PO TABS: 40.0000 mg | ORAL_TABLET | Freq: Every day | ORAL | Status: DC

## 2014-02-09 MED ORDER — FUROSEMIDE 40 MG PO TABS: 40.0000 mg | ORAL_TABLET | Freq: Two times a day (BID) | ORAL | Status: DC

## 2014-02-09 NOTE — Telephone Encounter (Signed)
Appt made

## 2014-02-09 NOTE — Patient Instructions (Signed)

## 2014-02-09 NOTE — Progress Notes (Signed)
   Subjective:    Patient ID: [name], female    DOB: 1946-04-29, 68 y.o.   MRN: [id number]  HPI Patient presents today complaining of shortness of breath x 2 weeks. SOB is worse with exertion. Patient is oxygen dependent. Has history of chronic peripheral edema and states it does not appear to have increased. Also has history of CHF. Associated symptoms include cough, chest tightness, and weight gain. Went 282 to 306 in 1 month. Currently taking Lasix 20 mg daily.    Review of Systems  Constitutional: Negative for fever.  Respiratory: Positive for cough and shortness of breath. Negative for wheezing.   Cardiovascular: Positive for leg swelling (Chronic Peripheral Edema).       Objective:   Physical Exam  Constitutional: She is oriented to person, place, and time.  Cardiovascular: Normal rate, regular rhythm and normal heart sounds.   2+ Non-pitting edema bil  Pulmonary/Chest: Breath sounds normal.  Oxygen Dependent  Neurological: She is alert and oriented to person, place, and time.  Skin: Skin is warm and dry.  Psychiatric: She has a normal mood and affect. Her behavior is normal. Judgment and thought content normal.   BP 180/72  Pulse 69  Temp(Src) 97.2 F (36.2 C) (Oral)  Ht 5\' 6"  (1.676 m)  SpO2 98%        Assessment & Plan:   1. Peripheral edema   2. Weight gain   3. SOB (shortness of breath)    Elevate legs when sitting Change lasix and lisinopril as ordered Daily weight - and record Keep diary of blood pressure Follow up in 2 weesks [place], FNP

## 2014-02-09 NOTE — Patient Instructions (Signed)
Anticoagulation Dose Instructions as of 02/09/2014     [name] Tue Wed Thu Fri Sat   New Dose 2.5 mg 5 mg 5 mg 5 mg 5 mg 5 mg 5 mg    Description       continue warfarin 1/2 tablet on Sunday and 1 tablet all other days.     INR was 2.7 today

## 2014-02-23 ENCOUNTER — Ambulatory Visit (INDEPENDENT_AMBULATORY_CARE_PROVIDER_SITE_OTHER): Payer: Medicare HMO | Admitting: Nurse Practitioner

## 2014-02-23 ENCOUNTER — Encounter: Payer: Self-pay | Admitting: Nurse Practitioner

## 2014-02-23 VITALS — BP 131/66 | HR 71 | Temp 97.2°F | Resp 24 | Ht 66.0 in | Wt 311.4 lb

## 2014-02-23 DIAGNOSIS — R609 Edema, unspecified: Secondary | ICD-10-CM

## 2014-02-23 DIAGNOSIS — I509 Heart failure, unspecified: Secondary | ICD-10-CM

## 2014-02-23 NOTE — Patient Instructions (Signed)
Heart Failure °Heart failure is a condition in which the heart has trouble pumping blood. This means your heart does not pump blood efficiently for your body to work well. In some cases of heart failure, fluid may back up into your lungs or you may have swelling (edema) in your lower legs. Heart failure is usually a long-term (chronic) condition. It is important for you to take good care of yourself and follow your caregiver's treatment plan. °CAUSES  °Some health conditions can cause heart failure. Those health conditions include: °· High blood pressure (hypertension) causes the heart muscle to work harder than normal. When pressure in the blood vessels is high, the heart needs to pump (contract) with more force in order to circulate blood throughout the body. High blood pressure eventually causes the heart to become stiff and weak. °· Coronary artery disease (CAD) is the buildup of cholesterol and fat (plaque) in the arteries of the heart. The blockage in the arteries deprives the heart muscle of oxygen and blood. This can cause chest pain and may lead to a heart attack. High blood pressure can also contribute to CAD. °· Heart attack (myocardial infarction) occurs when 1 or more arteries in the heart become blocked. The loss of oxygen damages the muscle tissue of the heart. When this happens, part of the heart muscle dies. The injured tissue does not contract as well and weakens the heart's ability to pump blood. °· Abnormal heart valves can cause heart failure when the heart valves do not open and close properly. This makes the heart muscle pump harder to keep the blood flowing. °· Heart muscle disease (cardiomyopathy or myocarditis) is damage to the heart muscle from a variety of causes. These can include drug or alcohol abuse, infections, or unknown reasons. These can increase the risk of heart failure. °· Lung disease makes the heart work harder because the lungs do not work properly. This can cause a strain  on the heart, leading it to fail. °· Diabetes increases the risk of heart failure. High blood sugar contributes to high fat (lipid) levels in the blood. Diabetes can also cause slow damage to tiny blood vessels that carry important nutrients to the heart muscle. When the heart does not get enough oxygen and food, it can cause the heart to become weak and stiff. This leads to a heart that does not contract efficiently. °· Other conditions can contribute to heart failure. These include abnormal heart rhythms, thyroid problems, and low blood counts (anemia). °Certain unhealthy behaviors can increase the risk of heart failure. Those unhealthy behaviors include: °· Being overweight. °· Smoking or chewing tobacco. °· Eating foods high in fat and cholesterol. °· Abusing illicit drugs or alcohol. °· Lacking physical activity. °SYMPTOMS  °Heart failure symptoms may vary and can be hard to detect. Symptoms may include: °· Shortness of breath with activity, such as climbing stairs. °· Persistent cough. °· Swelling of the feet, ankles, legs, or abdomen. °· Unexplained weight gain. °· Difficulty breathing when lying flat (orthopnea). °· Waking from sleep because of the need to sit up and get more air. °· Rapid heartbeat. °· Fatigue and loss of energy. °· Feeling lightheaded, dizzy, or close to fainting. °· Loss of appetite. °· Nausea. °· Increased urination during the night (nocturia). °DIAGNOSIS  °A diagnosis of heart failure is based on your history, symptoms, physical examination, and diagnostic tests. °Diagnostic tests for heart failure may include: °· Echocardiography. °· Electrocardiography. °· Chest X-ray. °· Blood tests. °· Exercise   stress test. °· Cardiac angiography. °· Radionuclide scans. °TREATMENT  °Treatment is aimed at managing the symptoms of heart failure. Medicines, behavioral changes, or surgical intervention may be necessary to treat heart failure. °· Medicines to help treat heart failure may  include: °· Angiotensin-converting enzyme (ACE) inhibitors. This type of medicine blocks the effects of a blood protein called angiotensin-converting enzyme. ACE inhibitors relax (dilate) the blood vessels and help lower blood pressure. °· Angiotensin receptor blockers. This type of medicine blocks the actions of a blood protein called angiotensin. Angiotensin receptor blockers dilate the blood vessels and help lower blood pressure. °· Water pills (diuretics). Diuretics cause the kidneys to remove salt and water from the blood. The extra fluid is removed through urination. This loss of extra fluid lowers the volume of blood the heart pumps. °· Beta blockers. These prevent the heart from beating too fast and improve heart muscle strength. °· Digitalis. This increases the force of the heartbeat. °· Healthy behavior changes include: °· Obtaining and maintaining a healthy weight. °· Stopping smoking or chewing tobacco. °· Eating heart healthy foods. °· Limiting or avoiding alcohol. °· Stopping illicit drug use. °· Physical activity as directed by your caregiver. °· Surgical treatment for heart failure may include: °· A procedure to open blocked arteries, repair damaged heart valves, or remove damaged heart muscle tissue. °· A pacemaker to improve heart muscle function and control certain abnormal heart rhythms. °· An internal cardioverter defibrillator to treat certain serious abnormal heart rhythms. °· A left ventricular assist device to assist the pumping ability of the heart. °HOME CARE INSTRUCTIONS  °· Take your medicine as directed by your caregiver. Medicines are important in reducing the workload of your heart, slowing the progression of heart failure, and improving your symptoms. °· Do not stop taking your medicine unless directed by your caregiver. °· Do not skip any dose of medicine. °· Refill your prescriptions before you run out of medicine. Your medicines are needed every day. °· Take over-the-counter  medicine only as directed by your caregiver or pharmacist. °· Engage in moderate physical activity if directed by your caregiver. Moderate physical activity can benefit some people. The elderly and people with severe heart failure should consult with a caregiver for physical activity recommendations. °· Eat heart healthy foods. Food choices should be free of trans fat and low in saturated fat, cholesterol, and salt (sodium). Healthy choices include fresh or frozen fruits and vegetables, fish, lean meats, legumes, fat-free or low-fat dairy products, and whole grain or high fiber foods. Talk to a dietitian to learn more about heart healthy foods. °· Limit sodium if directed by your caregiver. Sodium restriction may reduce symptoms of heart failure in some people. Talk to a dietitian to learn more about heart healthy seasonings. °· Use healthy cooking methods. Healthy cooking methods include roasting, grilling, broiling, baking, poaching, steaming, or stir-frying. Talk to a dietitian to learn more about healthy cooking methods. °· Limit fluids if directed by your caregiver. Fluid restriction may reduce symptoms of heart failure in some people. °· Weigh yourself every day. Daily weights are important in the early recognition of excess fluid. You should weigh yourself every morning after you urinate and before you eat breakfast. Wear the same amount of clothing each time you weigh yourself. Record your daily weight. Provide your caregiver with your weight record. °· Monitor and record your blood pressure if directed by your caregiver. °· Check your pulse if directed by your caregiver. °· Lose weight if directed   by your caregiver. Weight loss may reduce symptoms of heart failure in some people. °· Stop smoking or chewing tobacco. Nicotine makes your heart work harder by causing your blood vessels to constrict. Do not use nicotine gum or patches before talking to your caregiver. °· Schedule and attend follow-up visits as  directed by your caregiver. It is important to keep all your appointments. °· Limit alcohol intake to no more than 1 drink per day for nonpregnant women and 2 drinks per day for men. Drinking more than that is harmful to your heart. Tell your caregiver if you drink alcohol several times a week. Talk with your caregiver about whether alcohol is safe for you. If your heart has already been damaged by alcohol or you have severe heart failure, drinking alcohol should be stopped completely. °· Stop illicit drug use. °· Stay up-to-date with immunizations. It is especially important to prevent respiratory infections through current pneumococcal and influenza immunizations. °· Manage other health conditions such as hypertension, diabetes, thyroid disease, or abnormal heart rhythms as directed by your caregiver. °· Learn to manage stress. °· Plan rest periods when fatigued. °· Learn strategies to manage high temperatures. If the weather is extremely hot: °· Avoid vigorous physical activity. °· Use air conditioning or fans or seek a cooler location. °· Avoid caffeine and alcohol. °· Wear loose-fitting, lightweight, and light-colored clothing. °· Learn strategies to manage cold temperatures. If the weather is extremely cold: °· Avoid vigorous physical activity. °· Layer clothes. °· Wear mittens or gloves, a hat, and a scarf when going outside. °· Avoid alcohol. °· Obtain ongoing education and support as needed. °· Participate or seek rehabilitation as needed to maintain or improve independence and quality of life. °SEEK MEDICAL CARE IF:  °· Your weight increases by 03 lb/1.4 kg in 1 day or 05 lb/2.3 kg in a week. °· You have increasing shortness of breath that is unusual for you. °· You are unable to participate in your usual physical activities. °· You tire easily. °· You cough more than normal, especially with physical activity. °· You have any or more swelling in areas such as your hands, feet, ankles, or abdomen. °· You  are unable to sleep because it is hard to breathe. °· You feel like your heart is beating fast (palpitations). °· You become dizzy or lightheaded upon standing up. °SEEK IMMEDIATE MEDICAL CARE IF:  °· You have difficulty breathing. °· There is a change in mental status such as decreased alertness or difficulty with concentration. °· You have a pain or discomfort in your chest. °· You have an episode of fainting (syncope). °MAKE SURE YOU:  °· Understand these instructions. °· Will watch your condition. °· Will get help right away if you are not doing well or get worse. °Document Released: 11/17/2005 Document Revised: 03/14/2013 Document Reviewed: 12/09/2012 °[employer]® Patient Information ©2014 [employer]. ° °

## 2014-02-23 NOTE — Progress Notes (Signed)
   Subjective:    Patient ID: [name], female    DOB: [birth year], 68 y.o.   MRN: [id number]  HPI Patient was seen march 12 with SOB and edema- Dx with CHF- put her on lasix 40 mg BID- she says it has not helped at all- Her weight has gone up from 306 to 311 and she says her SOB has increased- SHe is on continuous O2 at home.    Review of Systems  Constitutional: Negative.   HENT: Negative.   Respiratory: Positive for shortness of breath.   Cardiovascular: Positive for chest pain and leg swelling.  Musculoskeletal: Negative.   Neurological: Negative.   Hematological: Negative.   Psychiatric/Behavioral: Negative.   All other systems reviewed and are negative.       Objective:   Physical Exam  Constitutional: She appears well-developed and well-nourished.  Cardiovascular: Normal rate, regular rhythm and normal heart sounds.   Pulmonary/Chest: Effort normal. She has rales (fine crackles left lower lobe).  Diminished breath sounds right lower lobe  Musculoskeletal: She exhibits edema (3+ edema bil lower ext.).  Skin: Skin is warm and dry.  Psychiatric: She has a normal mood and affect. Her behavior is normal. Judgment and thought content normal.    BP 131/66  Pulse 71  Temp(Src) 97.2 F (36.2 C) (Oral)  Resp 24  Ht 5\' 6"  (1.676 m)  Wt 311 lb 6.4 oz (141.25 kg)  BMI 50.29 kg/m2       Assessment & Plan:   1. Peripheral edema   2. CHF (congestive heart failure)    Patient to [facility] ER for admission  Mary-Margaret Hassell Done, FNP

## 2014-03-06 ENCOUNTER — Telehealth: Payer: Self-pay | Admitting: Nurse Practitioner

## 2014-03-06 NOTE — Telephone Encounter (Signed)
appt 4/14 with [name]

## 2014-03-12 ENCOUNTER — Other Ambulatory Visit: Payer: Self-pay | Admitting: Family Medicine

## 2014-03-14 ENCOUNTER — Encounter: Payer: Self-pay | Admitting: Nurse Practitioner

## 2014-03-14 ENCOUNTER — Ambulatory Visit (INDEPENDENT_AMBULATORY_CARE_PROVIDER_SITE_OTHER): Payer: Medicare HMO | Admitting: Nurse Practitioner

## 2014-03-14 ENCOUNTER — Telehealth: Payer: Self-pay

## 2014-03-14 VITALS — BP 139/74 | HR 72 | Temp 96.7°F | Ht 66.0 in | Wt 300.6 lb

## 2014-03-14 DIAGNOSIS — I4891 Unspecified atrial fibrillation: Secondary | ICD-10-CM

## 2014-03-14 DIAGNOSIS — Z09 Encounter for follow-up examination after completed treatment for conditions other than malignant neoplasm: Secondary | ICD-10-CM

## 2014-03-14 DIAGNOSIS — I509 Heart failure, unspecified: Secondary | ICD-10-CM

## 2014-03-14 LAB — POCT INR: INR: 1.5

## 2014-03-14 NOTE — Patient Instructions (Signed)
Anticoagulation Dose Instructions as of 03/14/2014     Dorene Grebe Tue Wed Thu Fri Sat   New Dose 5 mg 5 mg 5 mg 7.5 mg 5 mg 7.5 mg 5 mg    Description       meds as indicated and repeat in 1 week

## 2014-03-14 NOTE — Telephone Encounter (Signed)
Error

## 2014-03-14 NOTE — Progress Notes (Signed)
   Subjective:    Patient ID: [name], female    DOB: [birth year], 68 y.o.   MRN: [id number]  HPI Patient was in hospital last week with CHF- was given IV meds to draw off fluid- ALso had pleural effusion and had that drained as well- SHe is doing much better since discharge and not as SOB- Still on O2 at home. Swelling in feet gradually coming back. No medication changes other than changed from lasix to turosemide.    Review of Systems  Constitutional: Positive for fatigue.  HENT: Negative.   Respiratory: Positive for shortness of breath (better than has been in the past).   Cardiovascular: Positive for leg swelling.  Gastrointestinal: Negative.   Genitourinary: Negative.   Neurological: Negative.   Psychiatric/Behavioral: Negative.   All other systems reviewed and are negative.      Objective:   Physical Exam  Constitutional: She is oriented to person, place, and time. She appears well-developed and well-nourished.  Cardiovascular: Normal rate, regular rhythm and normal heart sounds.   Pulmonary/Chest: Effort normal and breath sounds normal.  Musculoskeletal: She exhibits edema (2+ pittoing edema bil feet and legs).  Neurological: She is alert and oriented to person, place, and time.  Skin: Skin is warm and dry.  Psychiatric: She has a normal mood and affect. Her behavior is normal. Judgment and thought content normal.  BP 139/74  Pulse 72  Temp(Src) 96.7 F (35.9 C) (Oral)  Ht 5\' 6"  (1.676 m)  Wt 300 lb 9.6 oz (136.351 kg)  BMI 48.54 kg/m2         Assessment & Plan:  1. Hospital discharge follow-up Reviewed hospital records  2. Atrial fibrillation Anticoagulation Dose Instructions as of 03/14/2014     Dorene Grebe Tue Wed Thu Fri Sat   New Dose 5 mg 5 mg 5 mg 7.5 mg 5 mg 7.5 mg 5 mg    Description       meds as indicated and repeat in 1 week      - POCT INR; Standing - POCT INR  3. CHF (congestive heart failure) daily weights- if 2 lb weight gain in  24 hrs call office Limit fluid intake  Mary-Margaret Hassell Done, FNP

## 2014-03-15 ENCOUNTER — Other Ambulatory Visit: Payer: Self-pay

## 2014-03-15 MED ORDER — DILTIAZEM HCL ER 180 MG PO CP24: ORAL_CAPSULE | ORAL | Status: DC

## 2014-03-16 ENCOUNTER — Telehealth: Payer: Self-pay

## 2014-03-16 NOTE — Telephone Encounter (Signed)
X one but no further refills unless she complies with follow upo.

## 2014-03-16 NOTE — Telephone Encounter (Signed)
She has an appt 5/27 in [place] can fill until then but if she no shows she will have to get from PCP

## 2014-03-17 ENCOUNTER — Other Ambulatory Visit: Payer: Self-pay

## 2014-03-17 MED ORDER — POTASSIUM CHLORIDE CRYS ER 20 MEQ PO TBCR: EXTENDED_RELEASE_TABLET | ORAL | Status: DC

## 2014-03-23 ENCOUNTER — Ambulatory Visit (INDEPENDENT_AMBULATORY_CARE_PROVIDER_SITE_OTHER): Payer: Medicare HMO | Admitting: Pharmacist

## 2014-03-23 DIAGNOSIS — I4891 Unspecified atrial fibrillation: Secondary | ICD-10-CM

## 2014-03-23 LAB — POCT INR: INR: 2.1

## 2014-03-23 NOTE — Patient Instructions (Signed)
Anticoagulation Dose Instructions as of 03/23/2014     [name] Tue Wed Thu Fri Sat   New Dose 5 mg 5 mg 5 mg 7.5 mg 5 mg 5 mg 5 mg    Description       Continue 1 and 1/2 tablets on wednesdays and 1 tablet all other days.        INR was 2.1 today

## 2014-04-12 ENCOUNTER — Telehealth: Payer: Self-pay | Admitting: *Deleted
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
## 2014-04-13 ENCOUNTER — Encounter: Payer: Self-pay | Admitting: Nurse Practitioner

## 2014-04-13 ENCOUNTER — Ambulatory Visit (INDEPENDENT_AMBULATORY_CARE_PROVIDER_SITE_OTHER): Payer: Medicare HMO | Admitting: Nurse Practitioner

## 2014-04-13 ENCOUNTER — Encounter (INDEPENDENT_AMBULATORY_CARE_PROVIDER_SITE_OTHER): Payer: Medicare HMO

## 2014-04-13 VITALS — BP 188/83 | HR 88 | Temp 98.0°F | Ht 66.0 in | Wt 299.0 lb

## 2014-04-13 DIAGNOSIS — I509 Heart failure, unspecified: Secondary | ICD-10-CM

## 2014-04-13 DIAGNOSIS — I4891 Unspecified atrial fibrillation: Secondary | ICD-10-CM

## 2014-04-13 DIAGNOSIS — R609 Edema, unspecified: Secondary | ICD-10-CM

## 2014-04-13 DIAGNOSIS — Z09 Encounter for follow-up examination after completed treatment for conditions other than malignant neoplasm: Secondary | ICD-10-CM

## 2014-04-13 LAB — POCT INR: INR: 3.5

## 2014-04-13 MED ORDER — METOLAZONE 2.5 MG PO TABS: ORAL_TABLET | ORAL | Status: DC

## 2014-04-13 MED ORDER — TORSEMIDE 20 MG PO TABS: 20.0000 mg | ORAL_TABLET | Freq: Two times a day (BID) | ORAL | Status: DC

## 2014-04-13 NOTE — Patient Instructions (Addendum)
Anticoagulation Dose Instructions as of 04/13/2014     [name] Tue Wed Thu Fri Sat   New Dose 5 mg 5 mg 5 mg 5 mg 5 mg 5 mg 5 mg    Description       No warfarin for 1 day, then decrease to 1 tablet daily.         INR was 3.5 todayPeripheral Edema You have swelling in your legs (peripheral edema). This swelling is due to excess accumulation of salt and water in your body. Edema may be a sign of heart, kidney or liver disease, or a side effect of a medication. It may also be due to problems in the leg veins. Elevating your legs and using special support stockings may be very helpful, if the cause of the swelling is due to poor venous circulation. Avoid long periods of standing, whatever the cause. Treatment of edema depends on identifying the cause. Chips, pretzels, pickles and other salty foods should be avoided. Restricting salt in your diet is almost always needed. Water pills (diuretics) are often used to remove the excess salt and water from your body via urine. These medicines prevent the kidney from reabsorbing sodium. This increases urine flow. Diuretic treatment may also result in lowering of potassium levels in your body. Potassium supplements may be needed if you have to use diuretics daily. Daily weights can help you keep track of your progress in clearing your edema. You should call your caregiver for follow up care as recommended. SEEK IMMEDIATE MEDICAL CARE IF:   You have increased swelling, pain, redness, or heat in your legs.  You develop shortness of breath, especially when lying down.  You develop chest or abdominal pain, weakness, or fainting.  You have a fever. Document Released: 12/25/2004 Document Revised: 02/09/2012 Document Reviewed: 12/05/2009 [facility] Patient Information 2014 [employer].
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
## 2014-04-13 NOTE — Progress Notes (Signed)
   Subjective:    Patient ID: [name], female    DOB: March 09, 1946, 68 y.o.   MRN: [id number]  HPI Patient brought in by daughter for hospital follow up- SHe was in the hospital for fluid retention and CHF- SHe had lost down to 282 when she had left the hospital- she is back up to 299 today- she thinks fluid is coming back. SHe denies SOB.On o2 at home around the clock.    Review of Systems  Constitutional: Negative.   HENT: Negative.   Eyes: Negative.   Respiratory: Negative for cough and shortness of breath.   Cardiovascular: Positive for leg swelling.  Gastrointestinal: Negative.   Genitourinary: Negative.   Neurological: Negative.   Psychiatric/Behavioral: Negative.   All other systems reviewed and are negative.      Objective:   Physical Exam  Constitutional: She is oriented to person, place, and time. She appears well-developed and well-nourished. No distress.  Cardiovascular: Normal rate and normal heart sounds.   Pulmonary/Chest: Effort normal. She has rales (bil bases).  Abdominal: Soft. Bowel sounds are normal.  Neurological: She is alert and oriented to person, place, and time.  Skin: Skin is warm and dry.  Psychiatric: She has a normal mood and affect. Her behavior is normal. Judgment and thought content normal.    BP 188/83  Pulse 88  Temp(Src) 98 F (36.7 C) (Oral)  Ht 5\' 6"  (1.676 m)  Wt 299 lb (135.626 kg)  BMI 48.28 kg/m2       Assessment & Plan:   1. Atrial fibrillation   2. Hospital discharge follow-up   3. Peripheral edema   4. CHF (congestive heart failure)    Meds ordered this encounter  Medications  . torsemide (DEMADEX) 20 MG tablet    Sig: Take 1 tablet (20 mg total) by mouth 2 (two) times daily.    Dispense:  60 tablet    Refill:  3    Order Specific Question:  Supervising Provider    Answer:  Chipper Herb [[id number]]  . metolazone (ZAROXOLYN) 2.5 MG tablet    Sig: Take one Friday and sat- and follow up monday    Dispense:   20 tablet    Refill:  0    Order Specific Question:  Supervising Provider    Answer:  Chipper Herb [[id number]]   Added zaroxlyn to take fro 2 days Daily weights Follow up Monday  Mary-Margaret Hassell Done, FNP

## 2014-04-14 ENCOUNTER — Other Ambulatory Visit: Payer: Self-pay | Admitting: *Deleted

## 2014-04-14 MED ORDER — DILTIAZEM HCL ER 180 MG PO CP24: ORAL_CAPSULE | ORAL | Status: DC

## 2014-04-14 MED ORDER — POTASSIUM CHLORIDE CRYS ER 20 MEQ PO TBCR: EXTENDED_RELEASE_TABLET | ORAL | Status: DC

## 2014-04-14 NOTE — Telephone Encounter (Signed)
This is not a home health pt, I do not know her or her family, her nurse or MMM should call, because she lives by herself

## 2014-04-17 ENCOUNTER — Encounter: Payer: Self-pay | Admitting: Nurse Practitioner

## 2014-04-17 ENCOUNTER — Ambulatory Visit (INDEPENDENT_AMBULATORY_CARE_PROVIDER_SITE_OTHER): Payer: Medicare HMO | Admitting: Nurse Practitioner

## 2014-04-17 VITALS — BP 144/66 | HR 73 | Temp 97.9°F | Ht 66.0 in | Wt 296.0 lb

## 2014-04-17 DIAGNOSIS — I872 Venous insufficiency (chronic) (peripheral): Secondary | ICD-10-CM

## 2014-04-17 DIAGNOSIS — R609 Edema, unspecified: Secondary | ICD-10-CM

## 2014-04-17 NOTE — Progress Notes (Signed)
   Subjective:    Patient ID: [name], female    DOB: 1946-09-17, 68 y.o.   MRN: [id number]  HPI Patient was seen May 14,2015 with peripheral edema increasing- she had just gotten out of hospital With CHF and she said that her weight was increasing an swelling getting worse- We put her on low dose zaroxolyn for 2 days. Weight down 3 lbs.    Review of Systems  Constitutional: Negative.   HENT: Negative.   Respiratory: Positive for shortness of breath.   Cardiovascular: Positive for leg swelling.  Gastrointestinal: Negative.   Genitourinary: Negative.   Neurological: Negative.   Psychiatric/Behavioral: Negative.   All other systems reviewed and are negative.      Objective:   Physical Exam  Constitutional: She is oriented to person, place, and time. She appears well-developed and well-nourished.  Cardiovascular: Normal rate and normal heart sounds.   Pulmonary/Chest: Effort normal. She has rales.  Musculoskeletal: She exhibits edema (2(+) edema bil LE).  Neurological: She is alert and oriented to person, place, and time.  Skin: Skin is warm and dry.  Psychiatric: She has a normal mood and affect. Her behavior is normal. Judgment and thought content normal.   BP 144/66  Pulse 73  Temp(Src) 97.9 F (36.6 C) (Oral)  Ht 5\' 6"  (1.676 m)  Wt 296 lb (134.265 kg)  BMI 47.80 kg/m2        Assessment & Plan:   1. Peripheral edema    Orders Placed This Encounter  Procedures  . Apply unna boot   Take zaroxolyn Tuesday and Thursday Elevate legs when sitting Recheck  On Friday  Mary-Margaret Hassell Done, FNP

## 2014-04-17 NOTE — Telephone Encounter (Signed)
This has already been ,taken care of patient came in on the 04/13/14 to get this taken care of with mmm.Marland KitchenMarland KitchenMarland Kitchen

## 2014-04-17 NOTE — Patient Instructions (Signed)

## 2014-04-21 ENCOUNTER — Ambulatory Visit (INDEPENDENT_AMBULATORY_CARE_PROVIDER_SITE_OTHER): Payer: Medicare HMO | Admitting: Nurse Practitioner

## 2014-04-21 ENCOUNTER — Encounter: Payer: Self-pay | Admitting: Nurse Practitioner

## 2014-04-21 VITALS — BP 169/75 | HR 73 | Temp 97.5°F | Ht 66.0 in | Wt 291.0 lb

## 2014-04-21 DIAGNOSIS — I878 Other specified disorders of veins: Secondary | ICD-10-CM

## 2014-04-21 DIAGNOSIS — I872 Venous insufficiency (chronic) (peripheral): Secondary | ICD-10-CM

## 2014-04-21 MED ORDER — METOLAZONE 2.5 MG PO TABS: ORAL_TABLET | ORAL | Status: DC

## 2014-04-21 NOTE — Progress Notes (Signed)
   Subjective:    Patient ID: [name], female    DOB: [birth year], 68 y.o.   MRN: [id number]  HPI Patient was seen May 18,2015 with venous stasis- she was put on zaroxlyn and unna boots were applied- Here today for follow -up-     Review of Systems  All other systems reviewed and are negative.      Objective:   Physical Exam  Constitutional: She is oriented to person, place, and time. She appears well-developed and well-nourished.  Cardiovascular: Normal rate, regular rhythm and normal heart sounds.   Pulmonary/Chest: Effort normal and breath sounds normal.  O 2 is nasal cannula   Musculoskeletal: Normal range of motion. She exhibits edema (2 (+) edema but we can actually see patient ankles for the first time in awhile.).  Neurological: She is alert and oriented to person, place, and time.  Skin: Skin is warm and dry.  Psychiatric: She has a normal mood and affect. Her behavior is normal. Judgment and thought content normal.    BP 169/75  Pulse 73  Temp(Src) 97.5 F (36.4 C) (Oral)  Ht 5\' 6"  (1.676 m)  Wt 291 lb (131.997 kg)  BMI 46.99 kg/m2       Assessment & Plan:   1. Venous stasis    Meds ordered this encounter  Medications  . metolazone (ZAROXOLYN) 2.5 MG tablet    Sig: 1 po on Sat and wednesday    Dispense:  30 tablet    Refill:  1    Order Specific Question:  Supervising Provider    Answer:  Chipper Herb [[id number]]   Unna boots Elevate legs when setting Will recheck legs when here to see clinical pharmacist  Mary-Margaret Hassell Done, FNP

## 2014-04-21 NOTE — Patient Instructions (Signed)
Venous Stasis or Chronic Venous Insufficiency Chronic venous insufficiency, also called venous stasis, is a condition that affects the veins in the legs. The condition prevents blood from being pumped through these veins effectively. Blood may no longer be pumped effectively from the legs back to the heart. This condition can range from mild to severe. With proper treatment, you should be able to continue with an active life. CAUSES  Chronic venous insufficiency occurs when the vein walls become stretched, weakened, or damaged or when valves within the vein are damaged. Some common causes of this include:  High blood pressure inside the veins (venous hypertension).  Increased blood pressure in the leg veins from long periods of sitting or standing.  A blood clot that blocks blood flow in a vein (deep vein thrombosis).  Inflammation of a superficial vein (phlebitis) that causes a blood clot to form. RISK FACTORS Various things can make you more likely to develop chronic venous insufficiency, including:  Family history of this condition.  Obesity.  Pregnancy.  Sedentary lifestyle.  Smoking.  Jobs requiring long periods of standing or sitting in one place.  Being a certain age. Women in their 22s and 35s and men in their 39s are more likely to develop this condition. SIGNS AND SYMPTOMS  Symptoms may include:   Varicose veins.  Skin breakdown or ulcers.  Reddened or discolored skin on the leg.  Brown, smooth, tight, and painful skin just above the ankle, usually on the inside surface (lipodermatosclerosis).  Swelling. DIAGNOSIS  To diagnose this condition, your health care provider will take a medical history and do a physical exam. The following tests may be ordered to confirm the diagnosis:  Duplex ultrasound A procedure that produces a picture of a blood vessel and nearby organs and also provides information on blood flow through the blood vessel.  Plethysmography A  procedure that tests blood flow.  A venogram, or venography A procedure used to look at the veins using X-ray and dye. TREATMENT The goals of treatment are to help you return to an active life and to minimize pain or disability. Treatment will depend on the severity of the condition. Medical procedures may be needed for severe cases. Treatment options may include:   Use of compression stockings. These can help with symptoms and lower the chances of the problem getting worse, but they do not cure the problem.  Sclerotherapy A procedure involving an injection of a material that "dissolves" the damaged veins. Other veins in the network of blood vessels take over the function of the damaged veins.  Surgery to remove the vein or cut off blood flow through the vein (vein stripping or laser ablation surgery).  Surgery to repair a valve. HOME CARE INSTRUCTIONS   Wear compression stockings as directed by your health care provider.  Only take over-the-counter or prescription medicines for pain, discomfort, or fever as directed by your health care provider.  Follow up with your health care provider as directed. SEEK MEDICAL CARE IF:   You have redness, swelling, or increasing pain in the affected area.  You see a red streak or line that extends up or down from the affected area.  You have a breakdown or loss of skin in the affected area, even if the breakdown is small.  You have an injury to the affected area. SEEK IMMEDIATE MEDICAL CARE IF:   You have an injury and open wound in the affected area.  Your pain is severe and does not improve with  medicine.  You have sudden numbness or weakness in the foot or ankle below the affected area, or you have trouble moving your foot or ankle.  You have a fever or persistent symptoms for more than 2 3 days.  You have a fever and your symptoms suddenly get worse. MAKE SURE YOU:   Understand these instructions.  Will watch your condition.  Will  get help right away if you are not doing well or get worse. Document Released: 03/23/2007 Document Revised: 09/07/2013 Document Reviewed: 07/25/2013 [facility] Patient Information 2014 [place].

## 2014-04-26 ENCOUNTER — Encounter: Payer: Self-pay | Admitting: Pharmacist

## 2014-04-26 ENCOUNTER — Ambulatory Visit (INDEPENDENT_AMBULATORY_CARE_PROVIDER_SITE_OTHER): Payer: Medicare HMO | Admitting: Pharmacist

## 2014-04-26 ENCOUNTER — Encounter: Payer: Self-pay | Admitting: Cardiology

## 2014-04-26 ENCOUNTER — Ambulatory Visit (INDEPENDENT_AMBULATORY_CARE_PROVIDER_SITE_OTHER): Payer: Medicare PPO | Admitting: Cardiology

## 2014-04-26 VITALS — Wt 287.5 lb

## 2014-04-26 VITALS — BP 148/65 | HR 60 | Ht 66.0 in | Wt 278.0 lb

## 2014-04-26 DIAGNOSIS — I4891 Unspecified atrial fibrillation: Secondary | ICD-10-CM

## 2014-04-26 LAB — POCT INR: INR: 2.6

## 2014-04-26 NOTE — Progress Notes (Signed)
 HPI The patient presents for evaluation of nonobstructive coronary disease and diastolic heart failure and atrial fibrillation.  Since I last saw the patient she broke her femur and had to have this repaired.  She did not apparently have any cardiac issues with this.  She has had a slow rehab and gets around with her walker.  She is on chronic O2.  She has some sporadic chest pain.  However, she says that this is not different than symptoms she was having at the time of a previous stres test.   She is limited by obesity and severe lymphedema.   She is not describing new PND or orthopnea.  Lower extremity edema is at baseline. She does not feel her atrial fib.    Allergies  Allergen Reactions  . Codeine     Current Outpatient Prescriptions  Medication Sig Dispense Refill  . acetaminophen (TYLENOL) 325 MG tablet Take 2 tablets (650 mg total) by mouth every 6 (six) hours as needed for pain or fever.      . alendronate (FOSAMAX) 70 MG tablet Take 1 tablet (70 mg total) by mouth every 7 (seven) days.  4 tablet  2  . CRESTOR 10 MG tablet TAKE ONE TABLET BY MOUTH AT BEDTIME  30 tablet  4  . diltiazem (DILACOR XR) 180 MG 24 hr capsule TAKE ONE CAPSULE BY MOUTH DAILY  30 capsule  0  . fish oil-omega-3 fatty acids 1000 MG capsule Take 2 g by mouth daily.        . gabapentin (NEURONTIN) 100 MG capsule Take 1 capsule (100 mg total) by mouth 3 (three) times daily.  90 capsule  0  . Insulin Detemir (LEVEMIR FLEXTOUCH) 100 UNIT/ML SOPN Inject 43 Units into the skin 2 (two) times daily.  3 mL  10  . lisinopril (PRINIVIL,ZESTRIL) 40 MG tablet Take 1 tablet (40 mg total) by mouth daily.  90 tablet  3  . metFORMIN (GLUCOPHAGE) 500 MG tablet TAKE 1 TABLET BY MOUTH TWICE A DAY  60 tablet  2  . metolazone (ZAROXOLYN) 2.5 MG tablet 1 po on Sat and wednesday  30 tablet  1  . potassium chloride SA (KLOR-CON M20) 20 MEQ tablet TAKE ONE TABLET BY MOUTH EVERY DAY  30 tablet  0  . torsemide (DEMADEX) 20 MG tablet Take 1  tablet (20 mg total) by mouth 2 (two) times daily.  60 tablet  3  . warfarin (COUMADIN) 5 MG tablet Take 1 tablet (5 mg total) by mouth See admin instructions. Take every evening except Monday.  No dose on Monday.  45 tablet  2  . B-D ULTRAFINE III SHORT PEN 31G X 8 MM MISC USE AS DIRECTED NEEDS TO BE SEEN  100 each  0  . Glucose Blood (BLOOD GLUCOSE TEST STRIPS) STRP Use to check BG up to tid.  Dx:  250.03  100 each  3  . glucose monitoring kit (FREESTYLE) monitoring kit Can dispense whichever glucometer is covered by patient's insurance.  Use to check BG up to tid.  Dx:  250.03  1 each  0  . Lancets MISC        No current facility-administered medications for this visit.    Past Medical History  Diagnosis Date  . Anemia   . Arthritis   . Asthma   . Cataract     had surgery  . CHF (congestive heart failure)   . Diabetes mellitus   . Hyperlipidemia   . Hypertension   .   CAD (coronary artery disease)     LAD 60% proximal stenosis mid and distal 60% stenosis 2009.  . Stroke     2 mini  . Oxygen dependent     2 liter per nasal cannula  . Gait instability     uses cane  . GERD (gastroesophageal reflux disease)   . Obesity   . Sleep apnea, obstructive 2008  . Atrial fibrillation   . COPD (chronic obstructive pulmonary disease)     Past Surgical History  Procedure Laterality Date  . Cataract extraction w/ intraocular lens  implant, bilateral    . Appendectomy    . Cholecystectomy    . Carpal tunnel release      right hand  . Cervical spine surgery      fusion  . Cesarean section    . Umbilical hernia repair    . Knee arthroscopy      twice, left  . Colonoscopy  11/22/2002    normal - Dr.Rrourk  . Upper gastrointestinal endoscopy  11/22/2002    Normal - Dr. Gala Romney  . Colonoscopy  11/26/2005    incomplete with negative BE (Dr. Rowe Pavy, [place])  . Femur im nail Left 05/08/2013    Procedure: INTRAMEDULLARY (IM) NAIL FEMORAL--LONG(BIOMET SYSTEM) and Zimmer Cables;  Surgeon:  Augustin Schooling, MD;  Location: [place];  Service: Orthopedics;  Laterality: Left;  . Hip surgery Left     ROS:   As stated in the HPI and negative for all other systems.  PHYSICAL EXAM BP 148/65  Pulse 60  Ht 5' 6" (1.676 m)  Wt 278 lb (126.1 kg)  BMI 44.89 kg/m2 GEN:  No distress NECK:  No jugular venous distention at 90 degrees, waveform within normal limits, carotid upstroke brisk and symmetric, no bruits, no thyromegaly LYMPHATICS:  No cervical adenopathy LUNGS:  Clear to auscultation bilaterally BACK:  No CVA tenderness CHEST:  Unremarkable HEART:  S1 and S2 within normal limits, no S3, no S4, no clicks, no rubs, 2/6 apical systolic murmur at the apex early peaking, no diastolic murmurs ABD:  Positive bowel sounds normal in frequency in pitch, no bruits, no rebound, no guarding, unable to assess midline mass or bruit with the patient seated. EXT:  2 plus pulses throughout, moderate edema, no cyanosis no clubbing SKIN:  No rashes no nodules NEURO:  Cranial nerves II through XII grossly intact, motor grossly intact throughout PSYCH:  Cognitively intact, oriented to person place and time   EKG:  Atrial fibrillation, rate 56, axis within normal limits, intervals within normal limits, no acute ST-T wave changes.  04/26/2014  ASSESSMENT AND PLAN   ATRIAL FIBRILLATION:  She tolerates anticoagulation.  She had good rate control on Holter last year.  No change in therapy or further studies are indicated.    CAD:  She has had no new symptoms since her last stress test.  No further work up is indicated.   DIASTOLIC HF:  She seems to have no left sided failure.  No change in therapy is indicated.   EDEMA:  Her weight is actually down.  This is treated with salt restriction, keeping her feet up and wraps.

## 2014-04-26 NOTE — Patient Instructions (Signed)
The current medical regimen is effective;  continue present plan and medications.  Follow up in 1 year with Dr Hochrein.  You will receive a letter in the mail 2 months before you are due.  Please call us when you receive this letter to schedule your follow up appointment.  

## 2014-04-26 NOTE — Patient Instructions (Signed)
Anticoagulation Dose Instructions as of 04/26/2014     [name] Tue Wed Thu Fri Sat   New Dose 5 mg 5 mg 5 mg 5 mg 5 mg 5 mg 5 mg    Description       Continue warfarin at current dose of 1 tablet daily.         INR was 2.6 today

## 2014-04-27 ENCOUNTER — Encounter: Payer: Self-pay | Admitting: Nurse Practitioner

## 2014-04-27 ENCOUNTER — Ambulatory Visit (INDEPENDENT_AMBULATORY_CARE_PROVIDER_SITE_OTHER): Payer: Medicare HMO | Admitting: Nurse Practitioner

## 2014-04-27 VITALS — BP 130/75 | HR 47 | Temp 98.1°F

## 2014-04-27 DIAGNOSIS — I878 Other specified disorders of veins: Secondary | ICD-10-CM

## 2014-04-27 DIAGNOSIS — I872 Venous insufficiency (chronic) (peripheral): Secondary | ICD-10-CM

## 2014-04-27 DIAGNOSIS — Z1212 Encounter for screening for malignant neoplasm of rectum: Secondary | ICD-10-CM

## 2014-04-27 NOTE — Addendum Note (Signed)
Addended by: Earlene Plater on: 04/27/2014 05:08 PM   Modules accepted: Orders

## 2014-04-27 NOTE — Patient Instructions (Signed)
Medicare Annual Wellness Visit  [place] and the medical providers at [facility] strive to bring you the best medical care.  In doing so we not only want to address your current medical conditions and concerns but also to detect new conditions early and prevent illness, disease and health-related problems.    Medicare offers a yearly Wellness Visit which allows our clinical staff to assess your need for preventative services including immunizations, lifestyle education, counseling to decrease risk of preventable diseases and screening for fall risk and other medical concerns.    This visit is provided free of charge (no copay) for all Medicare recipients. The clinical pharmacists at [facility] have begun to conduct these Wellness Visits which will also include a thorough review of all your medications.    As you primary medical provider recommend that you make an appointment for your Annual Wellness Visit if you have not done so already this year.  You may set up this appointment before you leave today or you may call back ([phone]) and schedule an appointment.  Please make sure when you call that you mention that you are scheduling your Annual Wellness Visit with the clinical pharmacist so that the appointment may be made for the proper length of time.    

## 2014-04-27 NOTE — Progress Notes (Signed)
   Subjective:    Patient ID: [name], female    DOB: Mar 31, 1946, 68 y.o.   MRN: [id number]  HPI Patoient in today for recheck of venous stasis- WE have been battling this for several weeks- we have had her in unna boots and we added zaroxlyn to her meds. This has helped a lot. Weight has gone down 8lbs in ,ast week and a half. SHe says that her breathing has also improved.    Review of Systems  Constitutional: Negative.   HENT: Negative.   Respiratory: Negative.   Cardiovascular: Negative.   Neurological: Negative.   Psychiatric/Behavioral: Negative.   All other systems reviewed and are negative.      Objective:   Physical Exam  Constitutional: She is oriented to person, place, and time. She appears well-developed and well-nourished.  Cardiovascular: Normal rate, regular rhythm and normal heart sounds.   Pulmonary/Chest: Effort normal and breath sounds normal.  Musculoskeletal: She exhibits edema (1-2+ edema bol lower ext.).  Neurological: She is alert and oriented to person, place, and time.  Skin: Skin is warm and dry.  Psychiatric: She has a normal mood and affect. Her behavior is normal. Judgment and thought content normal.   BP 130/75  Pulse 47  Temp(Src) 98.1 F (36.7 C) (Oral)        Assessment & Plan:   1. Venous stasis    Knee high compression hose Elevate legs when sitting Continue lasix and zaroxlyn as rx RTO if edema starts to worsen  Mary-Margaret Hassell Done, FNP

## 2014-05-08 ENCOUNTER — Other Ambulatory Visit: Payer: Self-pay | Admitting: Nurse Practitioner

## 2014-05-12 ENCOUNTER — Other Ambulatory Visit: Payer: Self-pay | Admitting: Nurse Practitioner

## 2014-05-18 ENCOUNTER — Ambulatory Visit (INDEPENDENT_AMBULATORY_CARE_PROVIDER_SITE_OTHER): Payer: Medicare HMO | Admitting: Pharmacist

## 2014-05-18 ENCOUNTER — Other Ambulatory Visit: Payer: Self-pay | Admitting: Nurse Practitioner

## 2014-05-18 DIAGNOSIS — I4891 Unspecified atrial fibrillation: Secondary | ICD-10-CM

## 2014-05-18 LAB — POCT INR: INR: 3.2

## 2014-05-18 NOTE — Patient Instructions (Signed)
Anticoagulation Dose Instructions as of 05/18/2014     [name] Tue Wed Thu Fri Sat   New Dose 5 mg 5 mg 5 mg 5 mg 2.5 mg 5 mg 5 mg    Description       Decrease warfarin to 1/2 tablet every Thursday and 1 tablet all other days.           INR was 3.2 today

## 2014-05-25 ENCOUNTER — Encounter: Payer: Self-pay | Admitting: Nurse Practitioner

## 2014-05-25 ENCOUNTER — Ambulatory Visit (INDEPENDENT_AMBULATORY_CARE_PROVIDER_SITE_OTHER): Payer: Medicare HMO | Admitting: Nurse Practitioner

## 2014-05-25 VITALS — BP 122/70 | HR 74 | Temp 97.9°F | Ht 66.0 in | Wt 280.0 lb

## 2014-05-25 DIAGNOSIS — N949 Unspecified condition associated with female genital organs and menstrual cycle: Secondary | ICD-10-CM

## 2014-05-25 DIAGNOSIS — N925 Other specified irregular menstruation: Secondary | ICD-10-CM

## 2014-05-25 DIAGNOSIS — N938 Other specified abnormal uterine and vaginal bleeding: Secondary | ICD-10-CM

## 2014-05-25 LAB — POCT HEMOGLOBIN: Hemoglobin: 10.6 g/dL — AB (ref 12.2–16.2)

## 2014-05-25 NOTE — Progress Notes (Signed)
   Subjective:    Patient ID: [name], female    DOB: [birth year], 68 y.o.   MRN: [id number]  HPI  Patient in c/o vaginal bleeding- started 2 weeks ago and lasted 8 days- SHe had slight cramping- SHe has been menopausal for >20 years.     Review of Systems  Constitutional: Negative.   HENT: Negative.   Respiratory: Negative.   Cardiovascular: Negative.   Psychiatric/Behavioral: Negative.   All other systems reviewed and are negative.      Objective:   Physical Exam  Constitutional: She appears well-developed and well-nourished.  Cardiovascular: Normal rate and normal heart sounds.   Pulmonary/Chest: Effort normal and breath sounds normal.  Genitourinary:  pelvic exam deferred until next week- at that time will do endometrial bx.    BP 122/70  Pulse 74  Temp(Src) 97.9 F (36.6 C) (Oral)  Ht 5\' 6"  (1.676 m)  Wt 280 lb (127.007 kg)  BMI 45.21 kg/m2       Assessment & Plan:   1. Dysfunctional uterine bleeding    Orders Placed This Encounter  Procedures  . POCT hemoglobin   Will schedule for endometrial bx in 1 week  [facility], FNP

## 2014-05-25 NOTE — Patient Instructions (Signed)

## 2014-05-26 ENCOUNTER — Other Ambulatory Visit: Payer: Self-pay | Admitting: Nurse Practitioner

## 2014-05-28 ENCOUNTER — Other Ambulatory Visit: Payer: Self-pay | Admitting: Nurse Practitioner

## 2014-05-28 MED ORDER — FERROUS FUMARATE-FOLIC ACID 324-1 MG PO TABS: 1.0000 | ORAL_TABLET | Freq: Every day | ORAL | Status: DC

## 2014-06-07 ENCOUNTER — Telehealth: Payer: Self-pay | Admitting: Nurse Practitioner

## 2014-06-09 ENCOUNTER — Telehealth: Payer: Self-pay | Admitting: Nurse Practitioner

## 2014-06-13 ENCOUNTER — Ambulatory Visit (INDEPENDENT_AMBULATORY_CARE_PROVIDER_SITE_OTHER): Payer: Medicare HMO | Admitting: Nurse Practitioner

## 2014-06-13 ENCOUNTER — Encounter: Payer: Self-pay | Admitting: Nurse Practitioner

## 2014-06-13 VITALS — BP 150/60 | HR 70 | Temp 98.1°F | Ht 66.0 in | Wt 288.0 lb

## 2014-06-13 DIAGNOSIS — N882 Stricture and stenosis of cervix uteri: Secondary | ICD-10-CM

## 2014-06-13 DIAGNOSIS — N938 Other specified abnormal uterine and vaginal bleeding: Secondary | ICD-10-CM

## 2014-06-13 DIAGNOSIS — N949 Unspecified condition associated with female genital organs and menstrual cycle: Secondary | ICD-10-CM

## 2014-06-13 NOTE — Patient Instructions (Signed)
Dysfunctional Uterine Bleeding Normally, menstrual periods begin between ages 11 to 17 in young women. A normal menstrual cycle/period may begin every 23 days up to 35 days and lasts from 1 to 7 days. Around 12 to 14 days before your menstrual period starts, ovulation (ovary produces an egg) occurs. When counting the time between menstrual periods, count from the first day of bleeding of the previous period to the first day of bleeding of the next period. Dysfunctional (abnormal) uterine bleeding is bleeding that is different from a normal menstrual period. Your periods may come earlier or later than usual. They may be lighter, have blood clots or be heavier. You may have bleeding between periods, or you may skip one period or more. You may have bleeding after sexual intercourse, bleeding after menopause, or no menstrual period. CAUSES   Pregnancy (normal, miscarriage, tubal).  IUDs (intrauterine device, birth control).  Birth control pills.  Hormone treatment.  Menopause.  Infection of the cervix.  Blood clotting problems.  Infection of the inside lining of the uterus.  Endometriosis, inside lining of the uterus growing in the pelvis and other female organs.  Adhesions (scar tissue) inside the uterus.  Obesity or severe weight loss.  Uterine polyps inside the uterus.  Cancer of the vagina, cervix, or uterus.  Ovarian cysts or polycystic ovary syndrome.  Medical problems (diabetes, thyroid disease).  Uterine fibroids (noncancerous tumor).  Problems with your female hormones.  Endometrial hyperplasia, very thick lining and enlarged cells inside of the uterus.  Medicines that interfere with ovulation.  Radiation to the pelvis or abdomen.  Chemotherapy. DIAGNOSIS   Your doctor will discuss the history of your menstrual periods, medicines you are taking, changes in your weight, stress in your life, and any medical problems you may have.  Your doctor will do a physical  and pelvic examination.  Your doctor may want to perform certain tests to make a diagnosis, such as:  Pap test.  Blood tests.  Cultures for infection.  CT scan.  Ultrasound.  Hysteroscopy.  Laparoscopy.  MRI.  Hysterosalpingography.  D and C.  Endometrial biopsy. TREATMENT  Treatment will depend on the cause of the dysfunctional uterine bleeding (DUB). Treatment may include:  Observing your menstrual periods for a couple of months.  Prescribing medicines for medical problems, including:  Antibiotics.  Hormones.  Birth control pills.  Removing an IUD (intrauterine device, birth control).  Surgery:  D and C (scrape and remove tissue from inside the uterus).  Laparoscopy (examine inside the abdomen with a lighted tube).  Uterine ablation (destroy lining of the uterus with electrical current, laser, heat, or freezing).  Hysteroscopy (examine cervix and uterus with a lighted tube).  Hysterectomy (remove the uterus). HOME CARE INSTRUCTIONS   If medicines were prescribed, take exactly as directed. Do not change or switch medicines without consulting your caregiver.  Long term heavy bleeding may result in iron deficiency. Your caregiver may have prescribed iron pills. They help replace the iron that your body lost from heavy bleeding. Take exactly as directed.  Do not take aspirin or medicines that contain aspirin one week before or during your menstrual period. Aspirin may make the bleeding worse.  If you need to change your sanitary pad or tampon more than once every 2 hours, stay in bed with your feet elevated and a cold pack on your lower abdomen. Rest as much as possible, until the bleeding stops or slows down.  Eat well-balanced meals. Eat foods high in iron. Examples   are:  Leafy green vegetables.  Whole-grain breads and cereals.  Eggs.  Meat.  Liver.  Do not try to lose weight until the abnormal bleeding has stopped and your blood iron level is  back to normal. Do not lift more than ten pounds or do strenuous activities when you are bleeding.  For a couple of months, make note on your calendar, marking the start and ending of your period, and the type of bleeding (light, medium, heavy, spotting, clots or missed periods). This is for your caregiver to better evaluate your problem. SEEK MEDICAL CARE IF:   You develop nausea (feeling sick to your stomach) and vomiting, dizziness, or diarrhea while you are taking your medicine.  You are getting lightheaded or weak.  You have any problems that may be related to the medicine you are taking.  You develop pain with your DUB.  You want to remove your IUD.  You want to stop or change your birth control pills or hormones.  You have any type of abnormal bleeding mentioned above.  You are over 16 years old and have not had a menstrual period yet.  You are 68 years old and you are still having menstrual periods.  You have any of the symptoms mentioned above.  You develop a rash. SEEK IMMEDIATE MEDICAL CARE IF:   An oral temperature above 102 F (38.9 C) develops.  You develop chills.  You are changing your sanitary pad or tampon more than once an hour.  You develop abdominal pain.  You pass out or faint. Document Released: 11/14/2000 Document Revised: 02/09/2012 Document Reviewed: 10/16/2009 [employer] Patient Information 2015 [employer]. This information is not intended to replace advice given to you by your health care provider. Make sure you discuss any questions you have with your health care provider.  

## 2014-06-13 NOTE — Progress Notes (Signed)
   Subjective:    Patient ID: [name], female    DOB: 1946/10/29, 68 y.o.   MRN: [id number]  HPI Patient is here today for dysfuncitoanal uterine bleeding. She was seen last week and will have an endometrial bx done today. Still having som e bleeding episodes. Denies abdominal cramping.   Review of Systems  Cardiovascular: Negative.   Genitourinary: Negative.   Psychiatric/Behavioral: Negative.   All other systems reviewed and are negative.      Objective:   Physical Exam  Constitutional: She appears well-developed and well-nourished. No distress.  Cardiovascular: Normal rate and normal heart sounds.   Pulmonary/Chest: Effort normal and breath sounds normal.  o2 via nasal cannula   Genitourinary:  Cervical stenosis- needs dilitation to do bx    BP 150/60  Pulse 70  Temp(Src) 98.1 F (36.7 C) (Oral)  Ht 5\' 6"  (1.676 m)  Wt 288 lb (130.636 kg)  BMI 46.51 kg/m2       Assessment & Plan:   1. Dysfunctional uterine bleeding   2. Cervical os stenosis     Attempted endometrial bx- Orders Placed This Encounter  Procedures  . Ambulatory referral to Gynecology    Referral Priority:  Routine    Referral Type:  Consultation    Referral Reason:  Specialty Services Required    Requested Specialty:  Gynecology    Number of Visits Requested:  1   RTO prn  Mary-Margaret Hassell Done, FNP

## 2014-06-17 IMAGING — CR DG CHEST 1V
1 series · 1 of 1 positions shown · non-contrast
Comparison: Chest radiograph performed 03/09/2013

CLINICAL DATA: Status post fall; concern for chest injury.

CHEST - 1 VIEW

[view not recorded]
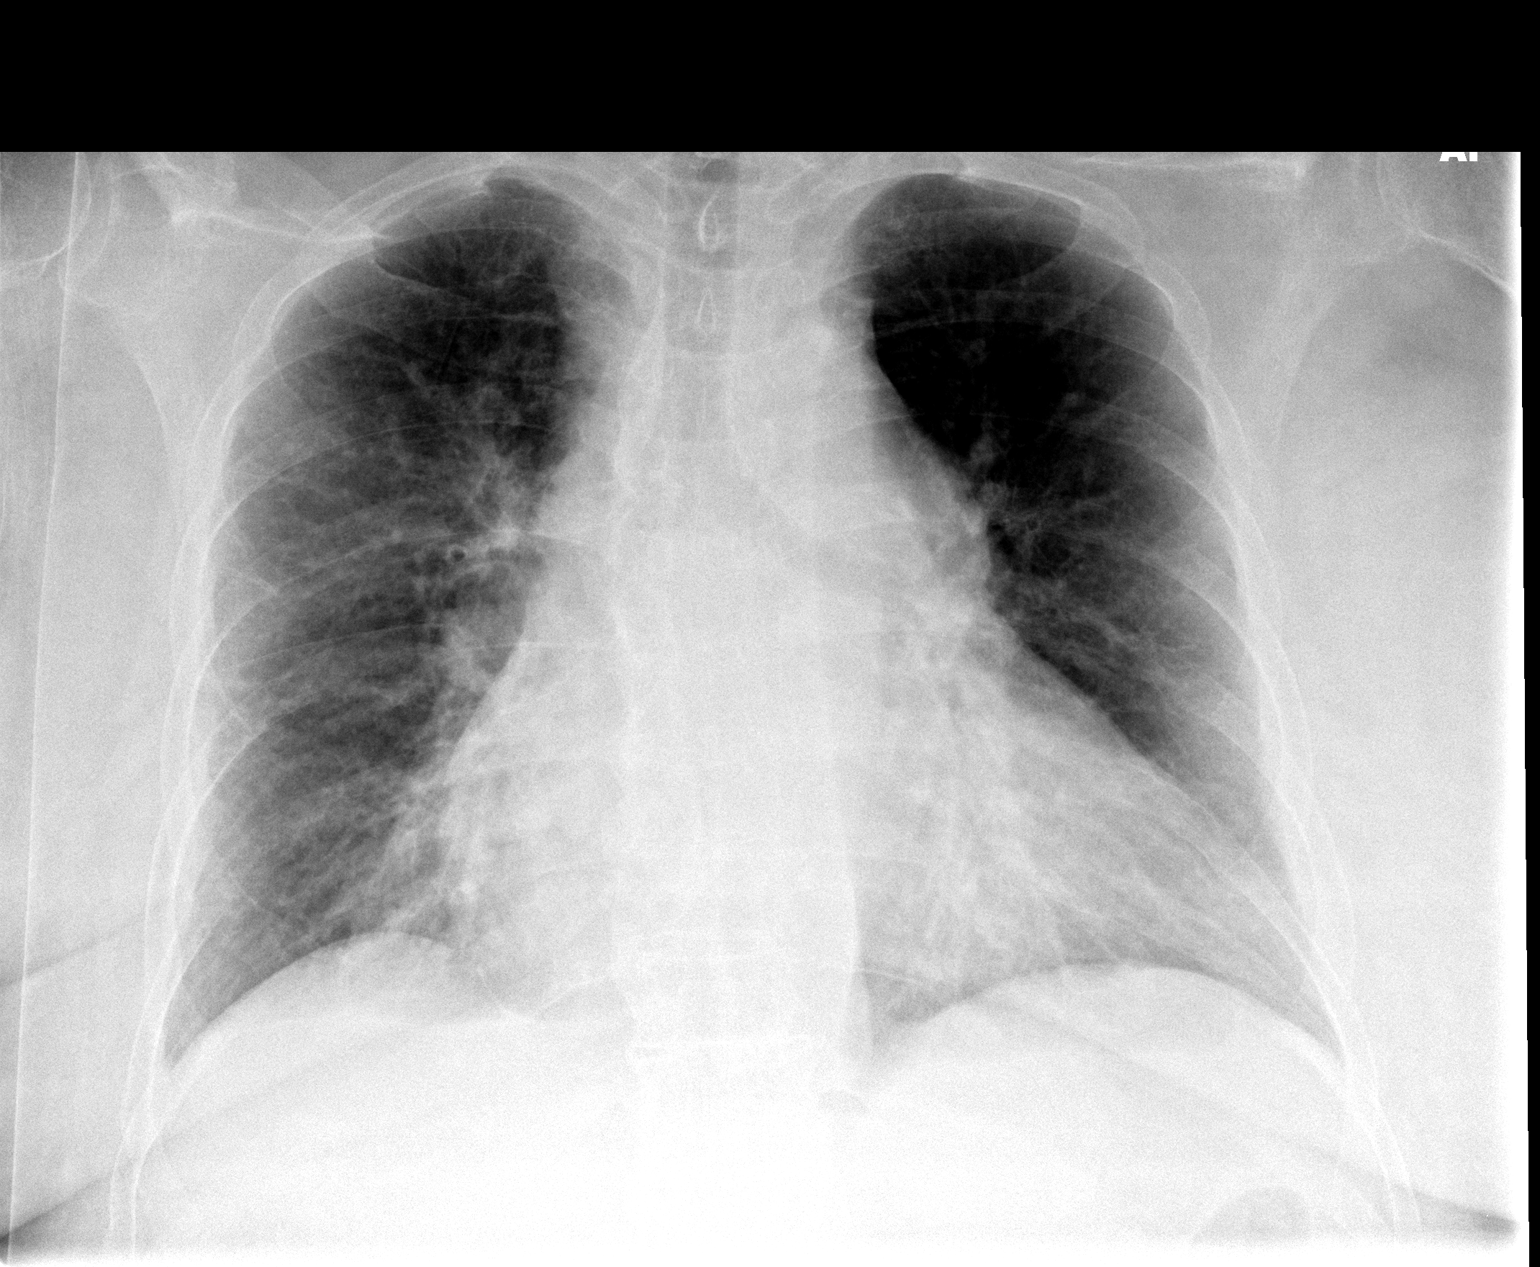

[1 of 1 positions shown; findings below may reference images not displayed]

FINDINGS: The lungs are well-aerated.  Vascular congestion is
noted, with chronically increased interstitial markings.  Some of
this may be transient in nature.  No pleural effusion or
pneumothorax is seen.

The cardiomediastinal silhouette is enlarged.  No acute osseous
abnormalities are seen.  Cervical spinal fusion hardware is
partially imaged.
IMPRESSION: 1.  No displaced rib fractures seen.
2.  Vascular congestion and cardiomegaly; the degree of vascular
congestion may be transient in nature, given recent fall.

## 2014-06-17 IMAGING — CR DG ELBOW 2V*L*
2 series · 2 of 2 positions shown · non-contrast
Comparison: None.

CLINICAL DATA: Left elbow bruising, status post fall.

LEFT ELBOW - 2 VIEW

[view not recorded (1 of 2)]
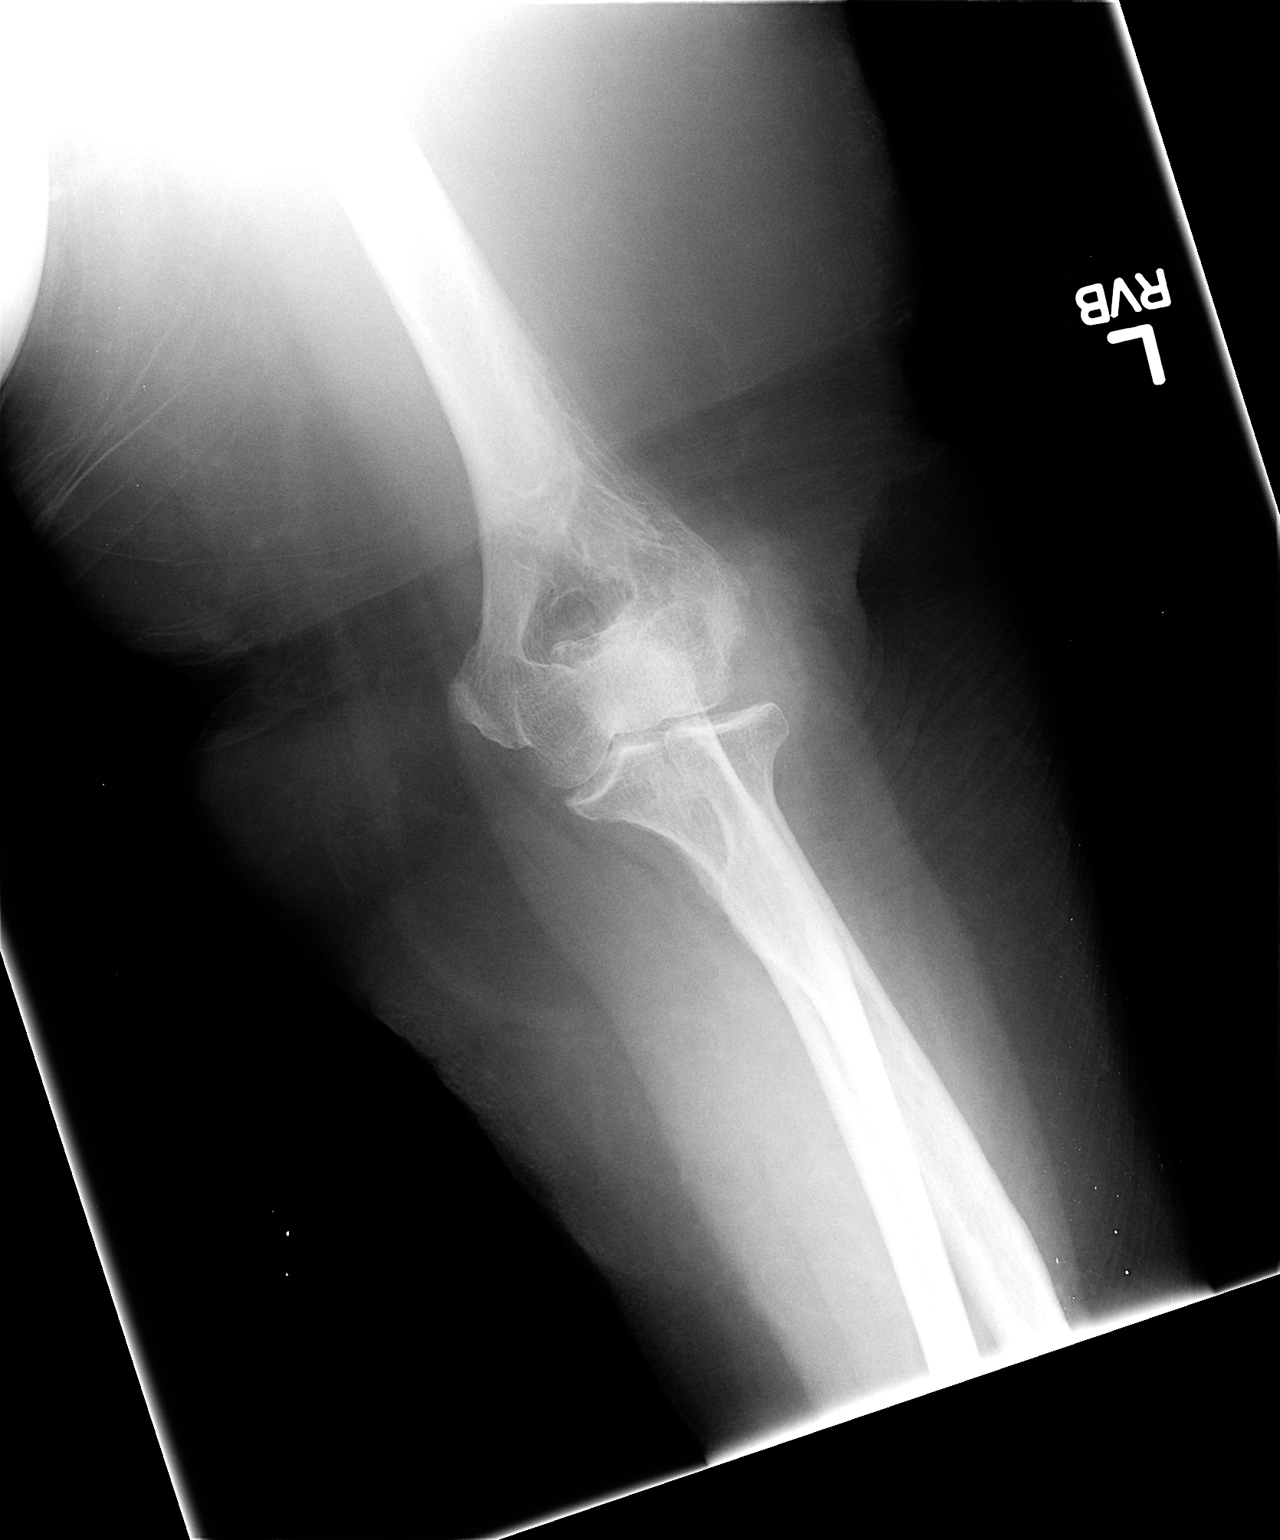

[view not recorded (2 of 2)]
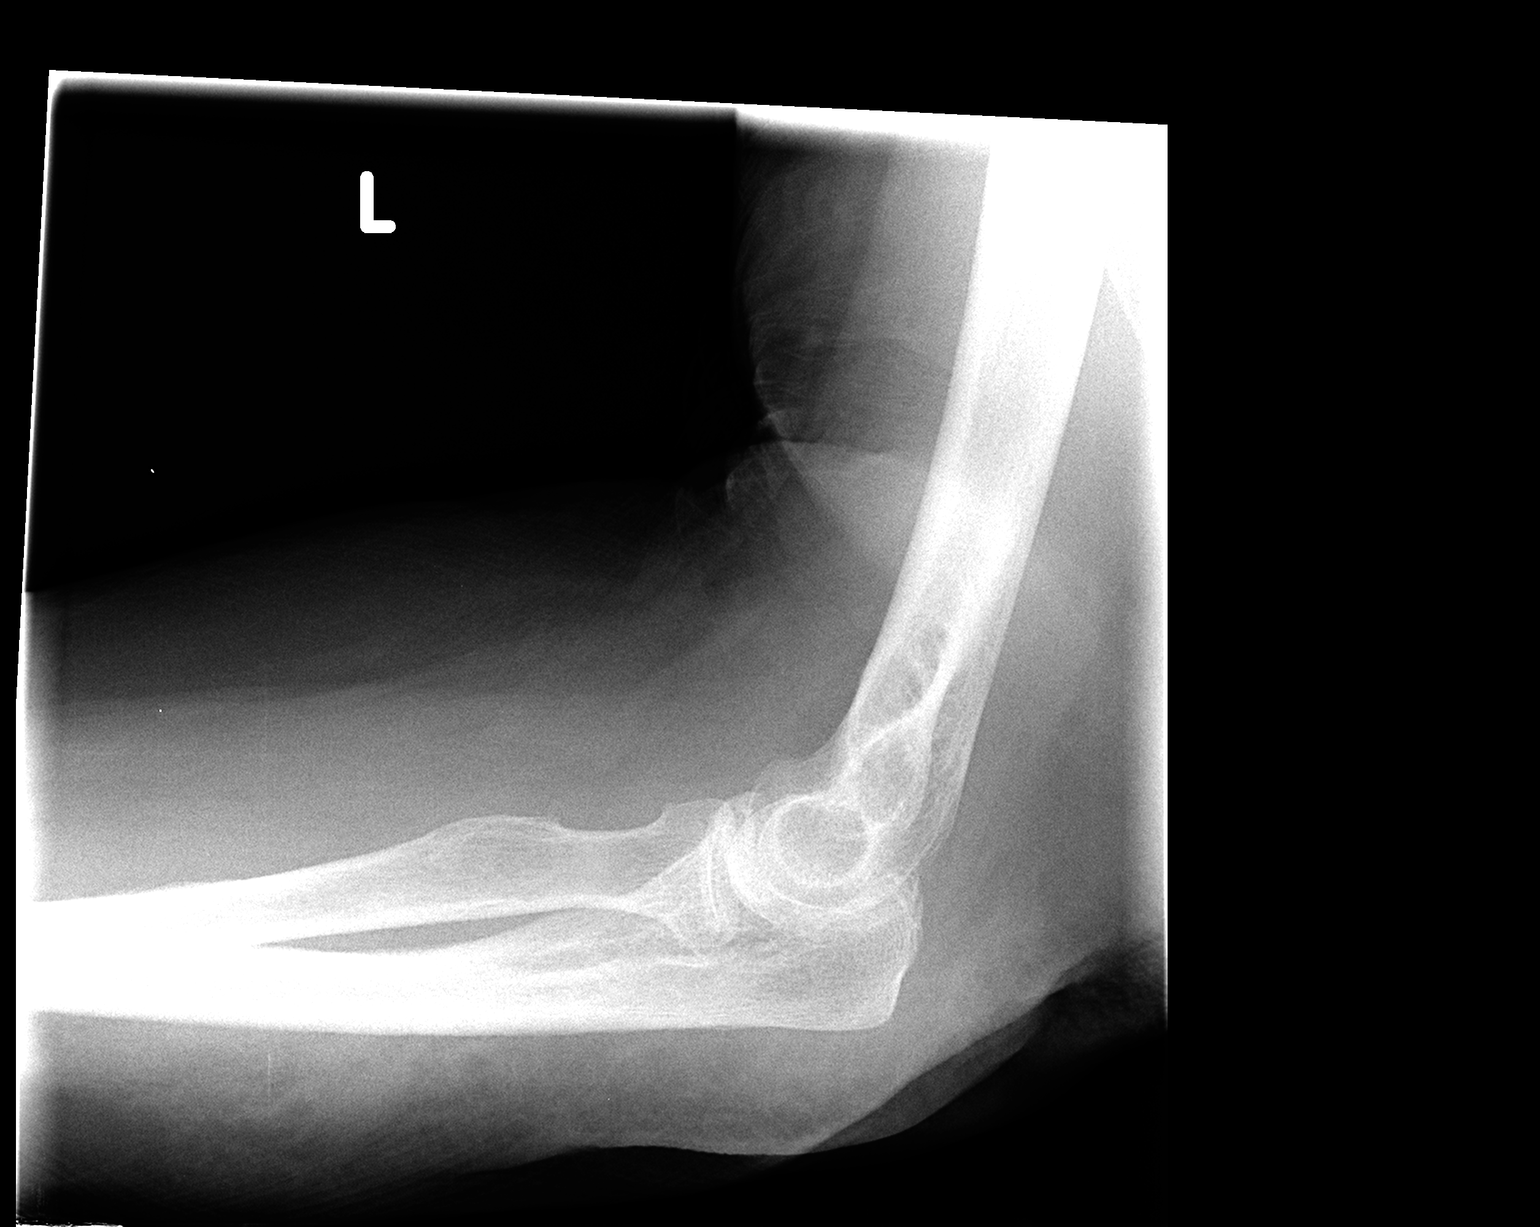

[2 of 2 positions shown; findings below may reference images not displayed]

FINDINGS: There is no evidence of fracture or dislocation.  The
visualized joint spaces are preserved.  No significant joint
effusion is identified.  Known soft tissue bruising is not well
characterized on radiograph.
IMPRESSION: No evidence of fracture or dislocation.

## 2014-06-19 ENCOUNTER — Ambulatory Visit (INDEPENDENT_AMBULATORY_CARE_PROVIDER_SITE_OTHER): Payer: Medicare HMO | Admitting: Pharmacist

## 2014-06-19 DIAGNOSIS — I4891 Unspecified atrial fibrillation: Secondary | ICD-10-CM

## 2014-06-19 LAB — POCT INR: INR: 2.8

## 2014-06-19 NOTE — Patient Instructions (Signed)
Anticoagulation Dose Instructions as of 06/19/2014     [name] Tue Wed Thu Fri Sat   New Dose 5 mg 5 mg 5 mg 5 mg 2.5 mg 5 mg 5 mg    Description       Continue warfarin 5mg  to 1/2 tablet every Thursday and 1 tablet all other days.           INR was 2.8 today

## 2014-07-26 ENCOUNTER — Ambulatory Visit (INDEPENDENT_AMBULATORY_CARE_PROVIDER_SITE_OTHER): Payer: Medicare HMO | Admitting: Pharmacist

## 2014-07-26 DIAGNOSIS — I4891 Unspecified atrial fibrillation: Secondary | ICD-10-CM

## 2014-07-26 LAB — POCT INR: INR: 3.9

## 2014-07-26 NOTE — Patient Instructions (Signed)
Anticoagulation Dose Instructions as of 07/26/2014     Sun Mon Tue Wed Thu Fri Sat   New Dose 2.5 mg 5 mg 5 mg 5 mg 2.5 mg 5 mg 5 mg    Description       No warfarin today - Wednesday, August 26th, then decreased to warfarin 5mg  to 1/2 tablet every Sunday and Thursday and 1 tablet all other days.           INR was 3.9 today

## 2014-08-18 ENCOUNTER — Encounter: Payer: Self-pay | Admitting: Family

## 2014-08-18 ENCOUNTER — Ambulatory Visit (INDEPENDENT_AMBULATORY_CARE_PROVIDER_SITE_OTHER): Payer: Medicare HMO | Admitting: Family

## 2014-08-18 VITALS — BP 140/60 | HR 70 | Ht 66.0 in | Wt 299.0 lb

## 2014-08-18 DIAGNOSIS — I509 Heart failure, unspecified: Secondary | ICD-10-CM

## 2014-08-18 DIAGNOSIS — I4891 Unspecified atrial fibrillation: Secondary | ICD-10-CM

## 2014-08-18 DIAGNOSIS — I5042 Chronic combined systolic (congestive) and diastolic (congestive) heart failure: Secondary | ICD-10-CM

## 2014-08-18 DIAGNOSIS — R635 Abnormal weight gain: Secondary | ICD-10-CM

## 2014-08-18 LAB — POCT INR: INR: 1.9

## 2014-08-18 MED ORDER — TORSEMIDE 20 MG PO TABS: 40.0000 mg | ORAL_TABLET | Freq: Two times a day (BID) | ORAL | Status: DC

## 2014-08-18 NOTE — Progress Notes (Signed)
   Subjective:    Patient ID: [name], female    DOB: [birth year], 68 y.o.   MRN: [id number]  Pt presents to office for INR check with clinical pharmacists. Pt has had 11lb weight gain in last month and has lower extremtriety edema. Pt states she is having some SOB at times, but states it is better while she sits. Pt states the SOB is worse with activity. Pt does have a cardiologists she sees. States her last visit was within this year.  Congestive Heart Failure Presents for follow-up visit. The disease course has been worsening. Associated symptoms include edema, fatigue, shortness of breath and unexpected weight change. Pertinent negatives include no abdominal pain, chest pain, chest pressure, muscle weakness, near-syncope, orthopnea or palpitations. The symptoms have been improved. The treatment provided mild relief. Compliance with prior treatments has been good. Her past medical history is significant for CAD, CVA and DM.      Review of Systems  Constitutional: Positive for fatigue and unexpected weight change.  HENT: Negative.   Eyes: Negative.   Respiratory: Positive for shortness of breath.   Cardiovascular: Positive for leg swelling. Negative for chest pain, palpitations and near-syncope.  Gastrointestinal: Negative.  Negative for abdominal pain.  Endocrine: Negative.   Genitourinary: Negative.   Musculoskeletal: Negative.  Negative for muscle weakness.  Neurological: Negative.  Negative for headaches.  Hematological: Negative.   Psychiatric/Behavioral: Negative.   All other systems reviewed and are negative.      Objective:   Physical Exam  Vitals reviewed. Constitutional: She is oriented to person, place, and time. She appears well-developed and well-nourished. No distress.  HENT:  Head: Normocephalic and atraumatic.  Right Ear: External ear normal.  Mouth/Throat: Oropharynx is clear and moist.  Eyes: Pupils are equal, round, and reactive to light.  Neck:  Normal range of motion. Neck supple. No thyromegaly present.  Cardiovascular: Normal rate, regular rhythm, normal heart sounds and intact distal pulses.   No murmur heard. Pulmonary/Chest: Effort normal and breath sounds normal. No respiratory distress. She has no wheezes.  Abdominal: Soft. Bowel sounds are normal. She exhibits no distension. There is no tenderness.  Musculoskeletal: Normal range of motion. She exhibits edema (4+ edema in bilateral LE (bilateral feet, ankle, legs)). She exhibits no tenderness.  Neurological: She is alert and oriented to person, place, and time. She has normal reflexes. No cranial nerve deficit.  Skin: Skin is warm and dry.  Psychiatric: She has a normal mood and affect. Her behavior is normal. Judgment and thought content normal.    BP 140/60  Pulse 70  Ht _0  (1.676 m)  Wt 299 lb (135.626 kg)  BMI 48.28 kg/m2       Assessment & Plan:  1. Atrial fibrillation - POCT INR  2. Chronic combined systolic and diastolic congestive heart failure - CMP14+EGFR - Brain natriuretic peptide - torsemide (DEMADEX) 20 MG tablet; Take 2 tablets (40 mg total) by mouth 2 (two) times daily.  Dispense: 120 tablet; Refill: 3  3. Weight gain - CMP14+EGFR - Brain natriuretic peptide - torsemide (DEMADEX) 20 MG tablet; Take 2 tablets (40 mg total) by mouth 2 (two) times daily.  Dispense: 120 tablet; Refill: 3  Will wait on BNP- May need to follow-up with cardiologists Demadex increased- trying to decrease LE edema Pt needs to weigh daily- Pt needs to report a 3 lb weight gain in one day or 5 lb in one week to provider  Evelina Dun, FNP

## 2014-08-18 NOTE — Patient Instructions (Addendum)
Anticoagulation Dose Instructions as of 08/18/2014     [name] Tue Wed Thu Fri Sat   New Dose 2.5 mg 5 mg 5 mg 5 mg 2.5 mg 5 mg 5 mg    Description       Take 1 and 1/2 tablets of warfarin today - Friday, September 18th.  Then continue same dose of warfarin 5mg  -  1/2 tablet every Sunday and Thursday and 1 tablet all other days.           INR was 1.9 today.  Heart Failure Heart failure is a condition in which the heart has trouble pumping blood. This means your heart does not pump blood efficiently for your body to work well. In some cases of heart failure, fluid may back up into your lungs or you may have swelling (edema) in your lower legs. Heart failure is usually a long-term (chronic) condition. It is important for you to take good care of yourself and follow your health care provider's treatment plan. CAUSES  Some health conditions can cause heart failure. Those health conditions include:  High blood pressure (hypertension). Hypertension causes the heart muscle to work harder than normal. When pressure in the blood vessels is high, the heart needs to pump (contract) with more force in order to circulate blood throughout the body. High blood pressure eventually causes the heart to become stiff and weak.  Coronary artery disease (CAD). CAD is the buildup of cholesterol and fat (plaque) in the arteries of the heart. The blockage in the arteries deprives the heart muscle of oxygen and blood. This can cause chest pain and may lead to a heart attack. High blood pressure can also contribute to CAD.  Heart attack (myocardial infarction). A heart attack occurs when one or more arteries in the heart become blocked. The loss of oxygen damages the muscle tissue of the heart. When this happens, part of the heart muscle dies. The injured tissue does not contract as well and weakens the heart's ability to pump blood.  Abnormal heart valves. When the heart valves do not open and close properly, it can  cause heart failure. This makes the heart muscle pump harder to keep the blood flowing.  Heart muscle disease (cardiomyopathy or myocarditis). Heart muscle disease is damage to the heart muscle from a variety of causes. These can include drug or alcohol abuse, infections, or unknown reasons. These can increase the risk of heart failure.  Lung disease. Lung disease makes the heart work harder because the lungs do not work properly. This can cause a strain on the heart, leading it to fail.  Diabetes. Diabetes increases the risk of heart failure. High blood sugar contributes to high fat (lipid) levels in the blood. Diabetes can also cause slow damage to tiny blood vessels that carry important nutrients to the heart muscle. When the heart does not get enough oxygen and food, it can cause the heart to become weak and stiff. This leads to a heart that does not contract efficiently.  Other conditions can contribute to heart failure. These include abnormal heart rhythms, thyroid problems, and low blood counts (anemia). Certain unhealthy behaviors can increase the risk of heart failure, including:  Being overweight.  Smoking or chewing tobacco.  Eating foods high in fat and cholesterol.  Abusing illicit drugs or alcohol.  Lacking physical activity. SYMPTOMS  Heart failure symptoms may vary and can be hard to detect. Symptoms may include:  Shortness of breath with activity, such as climbing stairs.  Persistent cough.  Swelling of the feet, ankles, legs, or abdomen.  Unexplained weight gain.  Difficulty breathing when lying flat (orthopnea).  Waking from sleep because of the need to sit up and get more air.  Rapid heartbeat.  Fatigue and loss of energy.  Feeling light-headed, dizzy, or close to fainting.  Loss of appetite.  Nausea.  Increased urination during the night (nocturia). DIAGNOSIS  A diagnosis of heart failure is based on your history, symptoms, physical examination, and  diagnostic tests. Diagnostic tests for heart failure may include:  Echocardiography.  Electrocardiography.  Chest X-ray.  Blood tests.  Exercise stress test.  Cardiac angiography.  Radionuclide scans. TREATMENT  Treatment is aimed at managing the symptoms of heart failure. Medicines, behavioral changes, or surgical intervention may be necessary to treat heart failure.  Medicines to help treat heart failure may include:  Angiotensin-converting enzyme (ACE) inhibitors. This type of medicine blocks the effects of a blood protein called angiotensin-converting enzyme. ACE inhibitors relax (dilate) the blood vessels and help lower blood pressure.  Angiotensin receptor blockers (ARBs). This type of medicine blocks the actions of a blood protein called angiotensin. Angiotensin receptor blockers dilate the blood vessels and help lower blood pressure.  Water pills (diuretics). Diuretics cause the kidneys to remove salt and water from the blood. The extra fluid is removed through urination. This loss of extra fluid lowers the volume of blood the heart pumps.  Beta blockers. These prevent the heart from beating too fast and improve heart muscle strength.  Digitalis. This increases the force of the heartbeat.  Healthy behavior changes include:  Obtaining and maintaining a healthy weight.  Stopping smoking or chewing tobacco.  Eating heart-healthy foods.  Limiting or avoiding alcohol.  Stopping illicit drug use.  Physical activity as directed by your health care provider.  Surgical treatment for heart failure may include:  A procedure to open blocked arteries, repair damaged heart valves, or remove damaged heart muscle tissue.  A pacemaker to improve heart muscle function and control certain abnormal heart rhythms.  An internal cardioverter defibrillator to treat certain serious abnormal heart rhythms.  A left ventricular assist device (LVAD) to assist the pumping ability of the  heart. HOME CARE INSTRUCTIONS   Take medicines only as directed by your health care provider. Medicines are important in reducing the workload of your heart, slowing the progression of heart failure, and improving your symptoms.  Do not stop taking your medicine unless directed by your health care provider.  Do not skip any dose of medicine.  Refill your prescriptions before you run out of medicine. Your medicines are needed every day.  Engage in moderate physical activity if directed by your health care provider. Moderate physical activity can benefit some people. The elderly and people with severe heart failure should consult with a health care provider for physical activity recommendations.  Eat heart-healthy foods. Food choices should be free of trans fat and low in saturated fat, cholesterol, and salt (sodium). Healthy choices include fresh or frozen fruits and vegetables, fish, lean meats, legumes, fat-free or low-fat dairy products, and whole grain or high fiber foods. Talk to a dietitian to learn more about heart-healthy foods.  Limit sodium if directed by your health care provider. Sodium restriction may reduce symptoms of heart failure in some people. Talk to a dietitian to learn more about heart-healthy seasonings.  Use healthy cooking methods. Healthy cooking methods include roasting, grilling, broiling, baking, poaching, steaming, or stir-frying. Talk to a dietitian to learn  more about healthy cooking methods.  Limit fluids if directed by your health care provider. Fluid restriction may reduce symptoms of heart failure in some people.  Weigh yourself every day. Daily weights are important in the early recognition of excess fluid. You should weigh yourself every morning after you urinate and before you eat breakfast. Wear the same amount of clothing each time you weigh yourself. Record your daily weight. Provide your health care provider with your weight record.  Monitor and record  your blood pressure if directed by your health care provider.  Check your pulse if directed by your health care provider.  Lose weight if directed by your health care provider. Weight loss may reduce symptoms of heart failure in some people.  Stop smoking or chewing tobacco. Nicotine makes your heart work harder by causing your blood vessels to constrict. Do not use nicotine gum or patches before talking to your health care provider.  Keep all follow-up visits as directed by your health care provider. This is important.  Limit alcohol intake to no more than 1 drink per day for nonpregnant women and 2 drinks per day for men. One drink equals 12 ounces of beer, 5 ounces of wine, or 1 ounces of hard liquor. Drinking more than that is harmful to your heart. Tell your health care provider if you drink alcohol several times a week. Talk with your health care provider about whether alcohol is safe for you. If your heart has already been damaged by alcohol or you have severe heart failure, drinking alcohol should be stopped completely.  Stop illicit drug use.  Stay up-to-date with immunizations. It is especially important to prevent respiratory infections through current pneumococcal and influenza immunizations.  Manage other health conditions such as hypertension, diabetes, thyroid disease, or abnormal heart rhythms as directed by your health care provider.  Learn to manage stress.  Plan rest periods when fatigued.  Learn strategies to manage high temperatures. If the weather is extremely hot:  Avoid vigorous physical activity.  Use air conditioning or fans or seek a cooler location.  Avoid caffeine and alcohol.  Wear loose-fitting, lightweight, and light-colored clothing.  Learn strategies to manage cold temperatures. If the weather is extremely cold:  Avoid vigorous physical activity.  Layer clothes.  Wear mittens or gloves, a hat, and a scarf when going outside.  Avoid  alcohol.  Obtain ongoing education and support as needed.  Participate in or seek rehabilitation as needed to maintain or improve independence and quality of life. SEEK MEDICAL CARE IF:   Your weight increases by 03 lb/1.4 kg in 1 day or 05 lb/2.3 kg in a week.  You have increasing shortness of breath that is unusual for you.  You are unable to participate in your usual physical activities.  You tire easily.  You cough more than normal, especially with physical activity.  You have any or more swelling in areas such as your hands, feet, ankles, or abdomen.  You are unable to sleep because it is hard to breathe.  You feel like your heart is beating fast (palpitations).  You become dizzy or light-headed upon standing up. SEEK IMMEDIATE MEDICAL CARE IF:   You have difficulty breathing.  There is a change in mental status such as decreased alertness or difficulty with concentration.  You have a pain or discomfort in your chest.  You have an episode of fainting (syncope). MAKE SURE YOU:   Understand these instructions.  Will watch your condition.  Will get  help right away if you are not doing well or get worse. Document Released: 11/17/2005 Document Revised: 04/03/2014 Document Reviewed: 12/17/2012 [facility] Patient Information 2015 [place], [place]. This information is not intended to replace advice given to you by your health care provider. Make sure you discuss any questions you have with your health care provider.

## 2014-08-19 ENCOUNTER — Telehealth: Payer: Self-pay | Admitting: *Deleted

## 2014-08-19 LAB — CMP14+EGFR
ALK PHOS: 164 IU/L — AB (ref 39–117)
ALT: 12 IU/L (ref 0–32)
AST: 16 IU/L (ref 0–40)
Albumin/Globulin Ratio: 1.9 (ref 1.1–2.5)
Albumin: 4.2 g/dL (ref 3.5–4.8)
BUN / CREAT RATIO: 23 (ref 11–26)
BUN: 26 mg/dL (ref 8–27)
CALCIUM: 9 mg/dL (ref 8.7–10.3)
CO2: 21 mmol/L (ref 18–29)
CREATININE: 1.12 mg/dL — AB (ref 0.57–1.00)
Chloride: 107 mmol/L (ref 97–108)
GFR calc Af Amer: 58 mL/min/{1.73_m2} — ABNORMAL LOW (ref >59)
GFR calc non Af Amer: 50 mL/min/{1.73_m2} — ABNORMAL LOW (ref >59)
GLOBULIN, TOTAL: 2.2 g/dL (ref 1.5–4.5)
Glucose: 121 mg/dL — ABNORMAL HIGH (ref 65–99)
Potassium: 6 mmol/L (ref 3.5–5.2)
SODIUM: 143 mmol/L (ref 134–144)
Total Bilirubin: 0.4 mg/dL (ref 0.0–1.2)
Total Protein: 6.4 g/dL (ref 6.0–8.5)

## 2014-08-19 LAB — BRAIN NATRIURETIC PEPTIDE: BNP: 170.8 pg/mL — ABNORMAL HIGH (ref 0.0–100.0)

## 2014-08-19 NOTE — Telephone Encounter (Signed)
[facility] labs called this am stating that patient had an elevated potassium level at 7.0. Called patient and advised to go to ER to have potassium rechecked. Patient verbalized understanding. Also spoke with daughter who said she would take her to [facility]. I called [facility] and gave report to them. Advised them to contact the office with any questions

## 2014-08-21 ENCOUNTER — Other Ambulatory Visit: Payer: Self-pay | Admitting: Family

## 2014-08-21 DIAGNOSIS — E875 Hyperkalemia: Secondary | ICD-10-CM

## 2014-08-22 ENCOUNTER — Other Ambulatory Visit (INDEPENDENT_AMBULATORY_CARE_PROVIDER_SITE_OTHER): Payer: Medicare HMO

## 2014-08-22 DIAGNOSIS — E875 Hyperkalemia: Secondary | ICD-10-CM

## 2014-08-22 NOTE — Progress Notes (Signed)
Lab only 

## 2014-08-23 LAB — BMP8+EGFR
BUN/Creatinine Ratio: 21 (ref 11–26)
BUN: 32 mg/dL — ABNORMAL HIGH (ref 8–27)
CHLORIDE: 98 mmol/L (ref 97–108)
CO2: 25 mmol/L (ref 18–29)
Calcium: 9.1 mg/dL (ref 8.7–10.3)
Creatinine, Ser: 1.53 mg/dL — ABNORMAL HIGH (ref 0.57–1.00)
GFR calc Af Amer: 39 mL/min/{1.73_m2} — ABNORMAL LOW (ref >59)
GFR, EST NON AFRICAN AMERICAN: 34 mL/min/{1.73_m2} — AB (ref >59)
Glucose: 109 mg/dL — ABNORMAL HIGH (ref 65–99)
Potassium: 4.9 mmol/L (ref 3.5–5.2)
Sodium: 142 mmol/L (ref 134–144)

## 2014-08-24 ENCOUNTER — Other Ambulatory Visit: Payer: Self-pay | Admitting: Nurse Practitioner

## 2014-08-28 ENCOUNTER — Encounter: Payer: Self-pay | Admitting: Family

## 2014-08-28 ENCOUNTER — Ambulatory Visit (INDEPENDENT_AMBULATORY_CARE_PROVIDER_SITE_OTHER): Payer: Medicare HMO | Admitting: Family

## 2014-08-28 VITALS — BP 112/59 | HR 60 | Temp 97.3°F | Ht 66.0 in | Wt 277.4 lb

## 2014-08-28 DIAGNOSIS — I270 Primary pulmonary hypertension: Secondary | ICD-10-CM

## 2014-08-28 DIAGNOSIS — R609 Edema, unspecified: Secondary | ICD-10-CM

## 2014-08-28 DIAGNOSIS — I5042 Chronic combined systolic (congestive) and diastolic (congestive) heart failure: Secondary | ICD-10-CM

## 2014-08-28 DIAGNOSIS — I509 Heart failure, unspecified: Secondary | ICD-10-CM

## 2014-08-28 NOTE — Progress Notes (Signed)
   Subjective:    Patient ID: [name], female    DOB: [birth year], 68 y.o.   MRN: [id number]  HPI Pt presents to office for 1 week follow-up on 11lb weight gain and lower extremity edema. Pt weighted 299lbs on last visit and weighs 176 lbs today. Pt reports feeling much better and able to breath better.      Review of Systems  Constitutional: Negative.   HENT: Negative.   Eyes: Negative.   Respiratory: Negative.  Negative for shortness of breath.   Cardiovascular: Negative.  Negative for palpitations.  Gastrointestinal: Negative.   Endocrine: Negative.   Genitourinary: Negative.   Musculoskeletal: Negative.   Neurological: Negative.  Negative for headaches.  Hematological: Negative.   Psychiatric/Behavioral: Negative.   All other systems reviewed and are negative.      Objective:   Physical Exam  Vitals reviewed. Constitutional: She is oriented to person, place, and time. She appears well-developed and well-nourished. No distress.  Cardiovascular: Normal rate, regular rhythm, normal heart sounds and intact distal pulses.   No murmur heard. Pulmonary/Chest: Effort normal and breath sounds normal. No respiratory distress. She has no wheezes.  Abdominal: Soft. Bowel sounds are normal. She exhibits no distension. There is no tenderness.  Musculoskeletal: Normal range of motion. She exhibits edema (4+ edema in bilateral ankles and feet). She exhibits no tenderness.  Neurological: She is alert and oriented to person, place, and time. She has normal reflexes. No cranial nerve deficit.  Skin: Skin is warm and dry.  Psychiatric: She has a normal mood and affect. Her behavior is normal. Judgment and thought content normal.    BP 112/59  Pulse 60  Temp(Src) 97.3 F (36.3 C) (Oral)  Ht _0  (1.676 m)  Wt 277 lb 6.4 oz (125.828 kg)  BMI 44.79 kg/m2       Assessment & Plan:  1. PULMONARY HYPERTENSION - BMP8+EGFR  2. CONGESTIVE HEART FAILURE (CHF) - BMP8+EGFR  3.  Chronic combined systolic and diastolic congestive heart failure - BMP8+EGFR  4. Peripheral edema  -Pt needs to weigh daily- Report 3lb weight gain or more than 5 lbs in one weeks -Keep appointments with cardiologists -Pt may need to see a nephrologists depending on lab results -Try to keep feet elevated  Evelina Dun, FNP

## 2014-08-28 NOTE — Patient Instructions (Addendum)
Peripheral Edema You have swelling in your legs (peripheral edema). This swelling is due to excess accumulation of salt and water in your body. Edema may be a sign of heart, kidney or liver disease, or a side effect of a medication. It may also be due to problems in the leg veins. Elevating your legs and using special support stockings may be very helpful, if the cause of the swelling is due to poor venous circulation. Avoid long periods of standing, whatever the cause. Treatment of edema depends on identifying the cause. Chips, pretzels, pickles and other salty foods should be avoided. Restricting salt in your diet is almost always needed. Water pills (diuretics) are often used to remove the excess salt and water from your body via urine. These medicines prevent the kidney from reabsorbing sodium. This increases urine flow. Diuretic treatment may also result in lowering of potassium levels in your body. Potassium supplements may be needed if you have to use diuretics daily. Daily weights can help you keep track of your progress in clearing your edema. You should call your caregiver for follow up care as recommended. SEEK IMMEDIATE MEDICAL CARE IF:   You have increased swelling, pain, redness, or heat in your legs.  You develop shortness of breath, especially when lying down.  You develop chest or abdominal pain, weakness, or fainting.  You have a fever. Document Released: 12/25/2004 Document Revised: 02/09/2012 Document Reviewed: 12/05/2009 [facility] Patient Information 2015 [place], [place]. This information is not intended to replace advice given to you by your health care provider. Make sure you discuss any questions you have with your health care provider.  Edema Edema is an abnormal buildup of fluids in your bodytissues. Edema is somewhatdependent on gravity to pull the fluid to the lowest place in your body. That makes the condition more common in the legs and thighs (lower extremities).  Painless swelling of the feet and ankles is common and becomes more likely as you get older. It is also common in looser tissues, like around your eyes.  When the affected area is squeezed, the fluid may move out of that spot and leave a dent for a few moments. This dent is called pitting.  CAUSES  There are many possible causes of edema. Eating too much salt and being on your feet or sitting for a long time can cause edema in your legs and ankles. Hot weather may make edema worse. Common medical causes of edema include:  Heart failure.  Liver disease.  Kidney disease.  Weak blood vessels in your legs.  Cancer.  An injury.  Pregnancy.  Some medications.  Obesity. SYMPTOMS  Edema is usually painless.Your skin may look swollen or shiny.  DIAGNOSIS  Your health care provider may be able to diagnose edema by asking about your medical history and doing a physical exam. You may need to have tests such as X-rays, an electrocardiogram, or blood tests to check for medical conditions that may cause edema.  TREATMENT  Edema treatment depends on the cause. If you have heart, liver, or kidney disease, you need the treatment appropriate for these conditions. General treatment may include:  Elevation of the affected body part above the level of your heart.  Compression of the affected body part. Pressure from elastic bandages or support stockings squeezes the tissues and forces fluid back into the blood vessels. This keeps fluid from entering the tissues.  Restriction of fluid and salt intake.  Use of a water pill (diuretic). These medications are  appropriate only for some types of edema. They pull fluid out of your body and make you urinate more often. This gets rid of fluid and reduces swelling, but diuretics can have side effects. Only use diuretics as directed by your health care provider. HOME CARE INSTRUCTIONS   Keep the affected body part above the level of your heart when you are  lying down.   Do not sit still or stand for prolonged periods.   Do not put anything directly under your knees when lying down.  Do not wear constricting clothing or garters on your upper legs.   Exercise your legs to work the fluid back into your blood vessels. This may help the swelling go down.   Wear elastic bandages or support stockings to reduce ankle swelling as directed by your health care provider.   Eat a low-salt diet to reduce fluid if your health care provider recommends it.   Only take medicines as directed by your health care provider. SEEK MEDICAL CARE IF:   Your edema is not responding to treatment.  You have heart, liver, or kidney disease and notice symptoms of edema.  You have edema in your legs that does not improve after elevating them.   You have sudden and unexplained weight gain. SEEK IMMEDIATE MEDICAL CARE IF:   You develop shortness of breath or chest pain.   You cannot breathe when you lie down.  You develop pain, redness, or warmth in the swollen areas.   You have heart, liver, or kidney disease and suddenly get edema.  You have a fever and your symptoms suddenly get worse. MAKE SURE YOU:   Understand these instructions.  Will watch your condition.  Will get help right away if you are not doing well or get worse. Document Released: 11/17/2005 Document Revised: 04/03/2014 Document Reviewed: 09/09/2013 [facility] Patient Information 2015 [place], [place]. This information is not intended to replace advice given to you by your health care provider. Make sure you discuss any questions you have with your health care provider.

## 2014-08-29 ENCOUNTER — Other Ambulatory Visit: Payer: Self-pay | Admitting: Family

## 2014-08-29 DIAGNOSIS — R7989 Other specified abnormal findings of blood chemistry: Secondary | ICD-10-CM

## 2014-08-29 LAB — BMP8+EGFR
BUN/Creatinine Ratio: 29 — ABNORMAL HIGH (ref 11–26)
BUN: 48 mg/dL — AB (ref 8–27)
CO2: 26 mmol/L (ref 18–29)
Calcium: 9.7 mg/dL (ref 8.7–10.3)
Chloride: 100 mmol/L (ref 97–108)
Creatinine, Ser: 1.64 mg/dL — ABNORMAL HIGH (ref 0.57–1.00)
GFR calc non Af Amer: 31 mL/min/{1.73_m2} — ABNORMAL LOW (ref >59)
GFR, EST AFRICAN AMERICAN: 36 mL/min/{1.73_m2} — AB (ref >59)
GLUCOSE: 198 mg/dL — AB (ref 65–99)
Potassium: 4.8 mmol/L (ref 3.5–5.2)
Sodium: 141 mmol/L (ref 134–144)

## 2014-08-31 ENCOUNTER — Telehealth: Payer: Self-pay

## 2014-08-31 NOTE — Telephone Encounter (Signed)
We need to hold the Lisinopril for patient   Please call back to let us know you received this message

## 2014-09-01 NOTE — Telephone Encounter (Signed)
Left message we are aware

## 2014-09-01 NOTE — Telephone Encounter (Signed)
Please notify [facility] we D/C lisinopril and added to allergy list as contraindicated.

## 2014-09-15 ENCOUNTER — Encounter: Payer: Self-pay | Admitting: Pharmacist

## 2014-09-15 ENCOUNTER — Other Ambulatory Visit: Payer: Self-pay | Admitting: *Deleted

## 2014-09-15 ENCOUNTER — Ambulatory Visit (INDEPENDENT_AMBULATORY_CARE_PROVIDER_SITE_OTHER): Payer: Medicare HMO | Admitting: Pharmacist

## 2014-09-15 VITALS — BP 138/62 | HR 70 | Ht 66.0 in | Wt 276.0 lb

## 2014-09-15 DIAGNOSIS — Z23 Encounter for immunization: Secondary | ICD-10-CM

## 2014-09-15 DIAGNOSIS — I4891 Unspecified atrial fibrillation: Secondary | ICD-10-CM

## 2014-09-15 DIAGNOSIS — Z0000 Encounter for general adult medical examination without abnormal findings: Secondary | ICD-10-CM

## 2014-09-15 DIAGNOSIS — E1149 Type 2 diabetes mellitus with other diabetic neurological complication: Secondary | ICD-10-CM

## 2014-09-15 DIAGNOSIS — S72142D Displaced intertrochanteric fracture of left femur, subsequent encounter for closed fracture with routine healing: Secondary | ICD-10-CM

## 2014-09-15 LAB — POCT GLYCOSYLATED HEMOGLOBIN (HGB A1C): HEMOGLOBIN A1C: 6.4

## 2014-09-15 LAB — POCT INR: INR: 2.6

## 2014-09-15 NOTE — Patient Instructions (Signed)
Health Maintenance Summary    Diabetic Eye Eam Overdue   patient to reschedule missed appoitment    TETANUS/TDAP Overdue 02/09/1963  Plan to get in 1 month     MAMMOGRAM Overdue 02/08/1994  Appointment made today    ZOSTAVAX Overdue 02/09/2004  Plan to get in 1 month    Pneumonia Vaccine Done today 09/15/2014      INFLUENZA VACCINE Done today 09/15/2014      DEXA / Bone Density Over Due  Referral made    URINE MICROALBUMIN Next Due 12/23/2014      HEMOGLOBIN A1C Done today 09/15/2014      COLONOSCOPY Next Due 11/27/2015  Last done 05/02/2011      Stop Lisnopril  Anticoagulation Dose Instructions as of 09/15/2014     Dorene Grebe Tue Wed Thu Fri Sat   New Dose 2.5 mg 5 mg 5 mg 5 mg 2.5 mg 5 mg 5 mg    Description        continue same dose of warfarin 62m -  1/2 tablet every Sunday and Thursday and 1 tablet all other days.           INR was 2.6 today  Preventive Care for Adults A healthy lifestyle and preventive care can promote health and wellness. Preventive health guidelines for women include the following key practices.  A routine yearly physical is a good way to check with your health care provider about your health and preventive screening. It is a chance to share any concerns and updates on your health and to receive a thorough exam.  Visit your dentist for a routine exam and preventive care every 6 months. Brush your teeth twice a day and floss once a day. Good oral hygiene prevents tooth decay and gum disease.  The frequency of eye exams is based on your age, health, family medical history, use of contact lenses, and other factors. Follow your health care provider's recommendations for frequency of eye exams.  Eat a healthy diet. Foods like vegetables, fruits, whole grains, low-fat dairy products, and lean protein foods contain the nutrients you need without too many calories. Decrease your intake of foods high in solid fats, added sugars, and salt. Eat the right amount of  calories for you.Get information about a proper diet from your health care provider, if necessary.  Regular physical exercise is one of the most important things you can do for your health. Most adults should get at least 150 minutes of moderate-intensity exercise (any activity that increases your heart rate and causes you to sweat) each week. In addition, most adults need muscle-strengthening exercises on 2 or more days a week.  Maintain a healthy weight. The body mass index (BMI) is a screening tool to identify possible weight problems. It provides an estimate of body fat based on height and weight. Your health care provider can find your BMI and can help you achieve or maintain a healthy weight.For adults 20 years and older:  A BMI below 18.5 is considered underweight.  A BMI of 18.5 to 24.9 is normal.  A BMI of 25 to 29.9 is considered overweight.  A BMI of 30 and above is considered obese.  Maintain normal blood lipids and cholesterol levels by exercising and minimizing your intake of saturated fat. Eat a balanced diet with plenty of fruit and vegetables. Blood tests for lipids and cholesterol should begin at age 662and be repeated every 5 years. If your lipid or cholesterol levels are high, you  are over 67, or you are at high risk for heart disease, you may need your cholesterol levels checked more frequently.Ongoing high lipid and cholesterol levels should be treated with medicines if diet and exercise are not working.  If you smoke, find out from your health care provider how to quit. If you do not use tobacco, do not start.  Lung cancer screening is recommended for adults aged 44-80 years who are at high risk for developing lung cancer because of a history of smoking. A yearly low-dose CT scan of the lungs is recommended for people who have at least a 30-pack-year history of smoking and are a current smoker or have quit within the past 15 years. A pack year of smoking is smoking an  average of 1 pack of cigarettes a day for 1 year (for example: 1 pack a day for 30 years or 2 packs a day for 15 years). Yearly screening should continue until the smoker has stopped smoking for at least 15 years. Yearly screening should be stopped for people who develop a health problem that would prevent them from having lung cancer treatment.  If you are pregnant, do not drink alcohol. If you are breastfeeding, be very cautious about drinking alcohol. If you are not pregnant and choose to drink alcohol, do not have more than 1 drink per day. One drink is considered to be 12 ounces (355 mL) of beer, 5 ounces (148 mL) of wine, or 1.5 ounces (44 mL) of liquor.  Avoid use of street drugs. Do not share needles with anyone. Ask for help if you need support or instructions about stopping the use of drugs.  High blood pressure causes heart disease and increases the risk of stroke. Your blood pressure should be checked at least every 1 to 2 years. Ongoing high blood pressure should be treated with medicines if weight loss and exercise do not work.  If you are 42-80 years old, ask your health care provider if you should take aspirin to prevent strokes.  Diabetes screening involves taking a blood sample to check your fasting blood sugar level. This should be done once every 3 years, after age 22, if you are within normal weight and without risk factors for diabetes. Testing should be considered at a younger age or be carried out more frequently if you are overweight and have at least 1 risk factor for diabetes.  Breast cancer screening is essential preventive care for women. You should practice "breast self-awareness." This means understanding the normal appearance and feel of your breasts and may include breast self-examination. Any changes detected, no matter how small, should be reported to a health care provider. Women in their 36s and 30s should have a clinical breast exam (CBE) by a health care provider as  part of a regular health exam every 1 to 3 years. After age 70, women should have a CBE every year. Starting at age 62, women should consider having a mammogram (breast X-ray test) every year. Women who have a family history of breast cancer should talk to their health care provider about genetic screening. Women at a high risk of breast cancer should talk to their health care providers about having an MRI and a mammogram every year.  Breast cancer gene (BRCA)-related cancer risk assessment is recommended for women who have family members with BRCA-related cancers. BRCA-related cancers include breast, ovarian, tubal, and peritoneal cancers. Having family members with these cancers may be associated with an increased risk for harmful  changes (mutations) in the breast cancer genes BRCA1 and BRCA2. Results of the assessment will determine the need for genetic counseling and BRCA1 and BRCA2 testing.  Routine pelvic exams to screen for cancer are no longer recommended for nonpregnant women who are considered low risk for cancer of the pelvic organs (ovaries, uterus, and vagina) and who do not have symptoms. Ask your health care provider if a screening pelvic exam is right for you.  If you have had past treatment for cervical cancer or a condition that could lead to cancer, you need Pap tests and screening for cancer for at least 20 years after your treatment. If Pap tests have been discontinued, your risk factors (such as having a new sexual partner) need to be reassessed to determine if screening should be resumed. Some women have medical problems that increase the chance of getting cervical cancer. In these cases, your health care provider may recommend more frequent screening and Pap tests.  The HPV test is an additional test that may be used for cervical cancer screening. The HPV test looks for the virus that can cause the cell changes on the cervix. The cells collected during the Pap test can be tested for  HPV. The HPV test could be used to screen women aged 29 years and older, and should be used in women of any age who have unclear Pap test results. After the age of 25, women should have HPV testing at the same frequency as a Pap test.  Colorectal cancer can be detected and often prevented. Most routine colorectal cancer screening begins at the age of 47 years and continues through age 22 years. However, your health care provider may recommend screening at an earlier age if you have risk factors for colon cancer. On a yearly basis, your health care provider may provide home test kits to check for hidden blood in the stool. Use of a small camera at the end of a tube, to directly examine the colon (sigmoidoscopy or colonoscopy), can detect the earliest forms of colorectal cancer. Talk to your health care provider about this at age 64, when routine screening begins. Direct exam of the colon should be repeated every 5-10 years through age 48 years, unless early forms of pre-cancerous polyps or small growths are found.  People who are at an increased risk for hepatitis B should be screened for this virus. You are considered at high risk for hepatitis B if:  You were born in a country where hepatitis B occurs often. Talk with your health care provider about which countries are considered high risk.  Your parents were born in a high-risk country and you have not received a shot to protect against hepatitis B (hepatitis B vaccine).  You have HIV or AIDS.  You use needles to inject street drugs.  You live with, or have sex with, someone who has hepatitis B.  You get hemodialysis treatment.  You take certain medicines for conditions like cancer, organ transplantation, and autoimmune conditions.  Hepatitis C blood testing is recommended for all people born from [birth year] through 1965 and any individual with known risks for hepatitis C.  Practice safe sex. Use condoms and avoid high-risk sexual practices to  reduce the spread of sexually transmitted infections (STIs). STIs include gonorrhea, chlamydia, syphilis, trichomonas, herpes, HPV, and human immunodeficiency virus (HIV). Herpes, HIV, and HPV are viral illnesses that have no cure. They can result in disability, cancer, and death.  You should be screened for sexually transmitted  illnesses (STIs) including gonorrhea and chlamydia if:  You are sexually active and are younger than 24 years.  You are older than 24 years and your health care provider tells you that you are at risk for this type of infection.  Your sexual activity has changed since you were last screened and you are at an increased risk for chlamydia or gonorrhea. Ask your health care provider if you are at risk.  If you are at risk of being infected with HIV, it is recommended that you take a prescription medicine daily to prevent HIV infection. This is called preexposure prophylaxis (PrEP). You are considered at risk if:  You are a heterosexual woman, are sexually active, and are at increased risk for HIV infection.  You take drugs by injection.  You are sexually active with a partner who has HIV.  Talk with your health care provider about whether you are at high risk of being infected with HIV. If you choose to begin PrEP, you should first be tested for HIV. You should then be tested every 3 months for as long as you are taking PrEP.  Osteoporosis is a disease in which the bones lose minerals and strength with aging. This can result in serious bone fractures or breaks. The risk of osteoporosis can be identified using a bone density scan. Women ages 64 years and over and women at risk for fractures or osteoporosis should discuss screening with their health care providers. Ask your health care provider whether you should take a calcium supplement or vitamin D to reduce the rate of osteoporosis.  Menopause can be associated with physical symptoms and risks. Hormone replacement  therapy is available to decrease symptoms and risks. You should talk to your health care provider about whether hormone replacement therapy is right for you.  Use sunscreen. Apply sunscreen liberally and repeatedly throughout the day. You should seek shade when your shadow is shorter than you. Protect yourself by wearing long sleeves, pants, a wide-brimmed hat, and sunglasses year round, whenever you are outdoors.  Once a month, do a whole body skin exam, using a mirror to look at the skin on your back. Tell your health care provider of new moles, moles that have irregular borders, moles that are larger than a pencil eraser, or moles that have changed in shape or color.  Stay current with required vaccines (immunizations).  Influenza vaccine. All adults should be immunized every year.  Tetanus, diphtheria, and acellular pertussis (Td, Tdap) vaccine. Pregnant women should receive 1 dose of Tdap vaccine during each pregnancy. The dose should be obtained regardless of the length of time since the last dose. Immunization is preferred during the 27th-36th week of gestation. An adult who has not previously received Tdap or who does not know her vaccine status should receive 1 dose of Tdap. This initial dose should be followed by tetanus and diphtheria toxoids (Td) booster doses every 10 years. Adults with an unknown or incomplete history of completing a 3-dose immunization series with Td-containing vaccines should begin or complete a primary immunization series including a Tdap dose. Adults should receive a Td booster every 10 years.  Varicella vaccine. An adult without evidence of immunity to varicella should receive 2 doses or a second dose if she has previously received 1 dose. Pregnant females who do not have evidence of immunity should receive the first dose after pregnancy. This first dose should be obtained before leaving the health care facility. The second dose should be obtained 4-8  weeks after the  first dose.  Human papillomavirus (HPV) vaccine. Females aged 13-26 years who have not received the vaccine previously should obtain the 3-dose series. The vaccine is not recommended for use in pregnant females. However, pregnancy testing is not needed before receiving a dose. If a female is found to be pregnant after receiving a dose, no treatment is needed. In that case, the remaining doses should be delayed until after the pregnancy. Immunization is recommended for any person with an immunocompromised condition through the age of 66 years if she did not get any or all doses earlier. During the 3-dose series, the second dose should be obtained 4-8 weeks after the first dose. The third dose should be obtained 24 weeks after the first dose and 16 weeks after the second dose.  Zoster vaccine. One dose is recommended for adults aged 43 years or older unless certain conditions are present.  Measles, mumps, and rubella (MMR) vaccine. Adults born before [birth year] generally are considered immune to measles and mumps. Adults born in [birth year] or later should have 1 or more doses of MMR vaccine unless there is a contraindication to the vaccine or there is laboratory evidence of immunity to each of the three diseases. A routine second dose of MMR vaccine should be obtained at least 28 days after the first dose for students attending postsecondary schools, health care workers, or international travelers. People who received inactivated measles vaccine or an unknown type of measles vaccine during 1963-1967 should receive 2 doses of MMR vaccine. People who received inactivated mumps vaccine or an unknown type of mumps vaccine before 1979 and are at high risk for mumps infection should consider immunization with 2 doses of MMR vaccine. For females of childbearing age, rubella immunity should be determined. If there is no evidence of immunity, females who are not pregnant should be vaccinated. If there is no evidence of immunity,  females who are pregnant should delay immunization until after pregnancy. Unvaccinated health care workers born before [birth year] who lack laboratory evidence of measles, mumps, or rubella immunity or laboratory confirmation of disease should consider measles and mumps immunization with 2 doses of MMR vaccine or rubella immunization with 1 dose of MMR vaccine.  Pneumococcal 13-valent conjugate (PCV13) vaccine. When indicated, a person who is uncertain of her immunization history and has no record of immunization should receive the PCV13 vaccine. An adult aged 51 years or older who has certain medical conditions and has not been previously immunized should receive 1 dose of PCV13 vaccine. This PCV13 should be followed with a dose of pneumococcal polysaccharide (PPSV23) vaccine. The PPSV23 vaccine dose should be obtained at least 8 weeks after the dose of PCV13 vaccine. An adult aged 51 years or older who has certain medical conditions and previously received 1 or more doses of PPSV23 vaccine should receive 1 dose of PCV13. The PCV13 vaccine dose should be obtained 1 or more years after the last PPSV23 vaccine dose.  Pneumococcal polysaccharide (PPSV23) vaccine. When PCV13 is also indicated, PCV13 should be obtained first. All adults aged 55 years and older should be immunized. An adult younger than age 9 years who has certain medical conditions should be immunized. Any person who resides in a nursing home or long-term care facility should be immunized. An adult smoker should be immunized. People with an immunocompromised condition and certain other conditions should receive both PCV13 and PPSV23 vaccines. People with human immunodeficiency virus (HIV) infection should be immunized as soon as possible after  diagnosis. Immunization during chemotherapy or radiation therapy should be avoided. Routine use of PPSV23 vaccine is not recommended for American Indians, [place] Natives, or people younger than 65 years unless there  are medical conditions that require PPSV23 vaccine. When indicated, people who have unknown immunization and have no record of immunization should receive PPSV23 vaccine. One-time revaccination 5 years after the first dose of PPSV23 is recommended for people aged 19-64 years who have chronic kidney failure, nephrotic syndrome, asplenia, or immunocompromised conditions. People who received 1-2 doses of PPSV23 before age 52 years should receive another dose of PPSV23 vaccine at age 22 years or later if at least 5 years have passed since the previous dose. Doses of PPSV23 are not needed for people immunized with PPSV23 at or after age 89 years.  Meningococcal vaccine. Adults with asplenia or persistent complement component deficiencies should receive 2 doses of quadrivalent meningococcal conjugate (MenACWY-D) vaccine. The doses should be obtained at least 2 months apart. Microbiologists working with certain meningococcal bacteria, [employer] recruits, people at risk during an outbreak, and people who travel to or live in countries with a high rate of meningitis should be immunized. A first-year college student up through age 66 years who is living in a residence hall should receive a dose if she did not receive a dose on or after her 16th birthday. Adults who have certain high-risk conditions should receive one or more doses of vaccine.  Hepatitis A vaccine. Adults who wish to be protected from this disease, have certain high-risk conditions, work with hepatitis A-infected animals, work in hepatitis A research labs, or travel to or work in countries with a high rate of hepatitis A should be immunized. Adults who were previously unvaccinated and who anticipate close contact with an international adoptee during the first 60 days after arrival in the [place] from a country with a high rate of hepatitis A should be immunized.  Hepatitis B vaccine. Adults who wish to be protected from this disease, have certain  high-risk conditions, may be exposed to blood or other infectious body fluids, are household contacts or sex partners of hepatitis B positive people, are clients or workers in certain care facilities, or travel to or work in countries with a high rate of hepatitis B should be immunized.  Preventive Services / Frequency AAges 65 years and over  Blood pressure check.** / Every 1 to 2 years.  Lipid and cholesterol check.** / Every 5 years beginning at age 65 years.  Lung cancer screening. / Every year if you are aged 92-80 years and have a 30-pack-year history of smoking and currently smoke or have quit within the past 15 years. Yearly screening is stopped once you have quit smoking for at least 15 years or develop a health problem that would prevent you from having lung cancer treatment.  Clinical breast exam.** / Every year after age 29 years.  BRCA-related cancer risk assessment.** / For women who have family members with a BRCA-related cancer (breast, ovarian, tubal, or peritoneal cancers).  Mammogram.** / Every year beginning at age 28 years and continuing for as long as you are in good health. Consult with your health care provider.  Pap test.** / Every 3 years starting at age 75 years through age 34 or 65 years with 3 consecutive normal Pap tests. Testing can be stopped between 65 and 70 years with 3 consecutive normal Pap tests and no abnormal Pap or HPV tests in the past 10 years.  HPV screening.** /  Every 3 years from ages 47 years through ages 95 or 65 years with a history of 3 consecutive normal Pap tests. Testing can be stopped between 65 and 70 years with 3 consecutive normal Pap tests and no abnormal Pap or HPV tests in the past 10 years.  Fecal occult blood test (FOBT) of stool. / Every year beginning at age 49 years and continuing until age 71 years. You may not need to do this test if you get a colonoscopy every 10 years.  Flexible sigmoidoscopy or colonoscopy.** / Every 5  years for a flexible sigmoidoscopy or every 10 years for a colonoscopy beginning at age 20 years and continuing until age 39 years.  Hepatitis C blood test.** / For all people born from [birth year] through 1967 and any individual with known risks for hepatitis C.  Osteoporosis screening.** / A one-time screening for women ages 108 years and over and women at risk for fractures or osteoporosis.  Skin self-exam. / Monthly.  Influenza vaccine. / Every year.  Tetanus, diphtheria, and acellular pertussis (Tdap/Td) vaccine.** / 1 dose of Td every 10 years.  Varicella vaccine.** / Consult your health care provider.  Zoster vaccine.** / 1 dose for adults aged 74 years or older.  Pneumococcal 13-valent conjugate (PCV13) vaccine.** / Consult your health care provider.  Pneumococcal polysaccharide (PPSV23) vaccine.** / 1 dose for all adults aged 33 years and older.  Meningococcal vaccine.** / Consult your health care provider.  Hepatitis A vaccine.** / Consult your health care provider.  Hepatitis B vaccine.** / Consult your health care provider.  Haemophilus influenzae type b (Hib) vaccine.** / Consult your health care provider. ** Family history and personal history of risk and conditions may change your health care provider's recommendations. Document Released: 01/13/2002 Document Revised: 04/03/2014 Document Reviewed: 04/14/2011 [facility] Patient Information 2015 [place], [place]. This information is not intended to replace advice given to you by your health care provider. Make sure you discuss any questions you have with your health care provider.

## 2014-09-15 NOTE — Progress Notes (Signed)
Patient ID: [name], female   DOB: 04-02-1946, 68 y.o.   MRN: [id number]  Subjective:    [name] is a 68 y.o. female who presents for Medicare Initial Wellness Visit and recheck INR.  Preventive Screening-Counseling & Management  Tobacco History  Smoking status  . Never Smoker   Smokeless tobacco  . Never Used     Current Problems (verified) Patient Active Problem List   Diagnosis Date Noted  . Chronic CHF (congestive heart failure) 08/26/2013  . Chronic diastolic CHF (congestive heart failure) 08/26/2013  . Leukocytosis, unspecified 05/09/2013  . Elevated blood pressure 05/09/2013  . Normocytic anemia due to blood loss 05/09/2013  . Thrombocytopenia, unspecified 05/09/2013  . 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 Hip fracture 05/06/2013  . Diabetes mellitus 05/06/2013  . Chest pain, midsternal 04/07/2012  . Special screening for malignant neoplasms, colon 05/05/2011  . Anemia 05/05/2011  . FIBRILLATION, ATRIAL 09/19/2009  . EDEMA 09/19/2009  . MELANOMA 02/28/2008  . Severe obesity (BMI >= 40) 02/28/2008  . PULMONARY HYPERTENSION 02/28/2008  . CONGESTIVE HEART FAILURE (CHF) 02/28/2008  . PERIPHERAL VASCULAR DISEASE 02/28/2008  . PULMONARY NODULE, LEFT UPPER LOBE 02/28/2008  . OBSTRUCTIVE SLEEP APNEA 02/28/2008    Medications Prior to Visit  *09/01/2014.*Lisinopril was not on medication list in medical record but patient was still taking.  According to telephone records it was recommended that lisnipril be stopped by [facility] 09/01/2014.  Current Outpatient Prescriptions on File Prior to Visit  Medication Sig Dispense Refill  . acetaminophen (TYLENOL) 325 MG tablet Take 2 tablets (650 mg total) by mouth every 6 (six) hours as needed for pain or fever.      Marland Kitchen alendronate (FOSAMAX) 70 MG tablet Take 1 tablet (70 mg total) by mouth every 7 (seven) days.  4 tablet  2  . B-D  ULTRAFINE III SHORT PEN 31G X 8 MM MISC USE AS DIRECTED NEEDS TO BE SEEN  100 each  0  . CRESTOR 10 MG tablet TAKE ONE TABLET BY MOUTH AT BEDTIME  30 tablet  4  . diltiazem (CARDIZEM CD) 180 MG 24 hr capsule TAKE ONE CAPSULE BY MOUTH DAILY  30 capsule  2  . Ferrous Fumarate-Folic Acid 226-3 MG TABS Take 1 tablet by mouth daily.  30 each  3  . fish oil-omega-3 fatty acids 1000 MG capsule Take 2 g by mouth daily.        Marland Kitchen gabapentin (NEURONTIN) 100 MG capsule Take 1 capsule (100 mg total) by mouth 3 (three) times daily.  90 capsule  0  . Glucose Blood (BLOOD GLUCOSE TEST STRIPS) STRP Use to check BG up to tid.  Dx:  250.03  100 each  3  . glucose monitoring kit (FREESTYLE) monitoring kit Can dispense whichever glucometer is covered by patient's insurance.  Use to check BG up to tid.  Dx:  250.03  1 each  0  . Insulin Detemir (LEVEMIR FLEXTOUCH) 100 UNIT/ML SOPN Inject 43 Units into the skin 2 (two) times daily.  3 mL  10  . KLOR-CON M20 20 MEQ tablet TAKE ONE TABLET BY MOUTH EVERY DAY  30 tablet  5  . Lancets MISC       . metFORMIN (GLUCOPHAGE) 500 MG tablet TAKE 1 TABLET BY MOUTH TWICE A DAY  60 tablet  2  . metolazone (ZAROXOLYN) 2.5 MG tablet 1 po on Sat and wednesday  30 tablet  1  .  torsemide (DEMADEX) 20 MG tablet Take 2 tablets (40 mg total) by mouth 2 (two) times daily.  120 tablet  3  . warfarin (COUMADIN) 5 MG tablet TAKE 1 TABLET BY MOUTH EVERY EVENING EXCEPT MONDAY. NO DOSE MONDAY  45 tablet  2   No current facility-administered medications on file prior to visit.    Current Medications (verified) Current Outpatient Prescriptions  Medication Sig Dispense Refill  . acetaminophen (TYLENOL) 325 MG tablet Take 2 tablets (650 mg total) by mouth every 6 (six) hours as needed for pain or fever.      Marland Kitchen alendronate (FOSAMAX) 70 MG tablet Take 1 tablet (70 mg total) by mouth every 7 (seven) days.  4 tablet  2  . B-D ULTRAFINE III SHORT PEN 31G X 8 MM MISC USE AS DIRECTED NEEDS TO BE SEEN  100  each  0  . CRESTOR 10 MG tablet TAKE ONE TABLET BY MOUTH AT BEDTIME  30 tablet  4  . diltiazem (CARDIZEM CD) 180 MG 24 hr capsule TAKE ONE CAPSULE BY MOUTH DAILY  30 capsule  2  . Ferrous Fumarate-Folic Acid 034-9 MG TABS Take 1 tablet by mouth daily.  30 each  3  . fish oil-omega-3 fatty acids 1000 MG capsule Take 2 g by mouth daily.        Marland Kitchen gabapentin (NEURONTIN) 100 MG capsule Take 1 capsule (100 mg total) by mouth 3 (three) times daily.  90 capsule  0  . Glucose Blood (BLOOD GLUCOSE TEST STRIPS) STRP Use to check BG up to tid.  Dx:  250.03  100 each  3  . glucose monitoring kit (FREESTYLE) monitoring kit Can dispense whichever glucometer is covered by patient's insurance.  Use to check BG up to tid.  Dx:  250.03  1 each  0  . Insulin Detemir (LEVEMIR FLEXTOUCH) 100 UNIT/ML SOPN Inject 43 Units into the skin 2 (two) times daily.  3 mL  10  . KLOR-CON M20 20 MEQ tablet TAKE ONE TABLET BY MOUTH EVERY DAY  30 tablet  5  . Lancets MISC       . metFORMIN (GLUCOPHAGE) 500 MG tablet TAKE 1 TABLET BY MOUTH TWICE A DAY  60 tablet  2  . metolazone (ZAROXOLYN) 2.5 MG tablet 1 po on Sat and wednesday  30 tablet  1  . torsemide (DEMADEX) 20 MG tablet Take 2 tablets (40 mg total) by mouth 2 (two) times daily.  120 tablet  3  . warfarin (COUMADIN) 5 MG tablet TAKE 1 TABLET BY MOUTH EVERY EVENING EXCEPT MONDAY. NO DOSE MONDAY  45 tablet  2   No current facility-administered medications for this visit.     Allergies (verified) Codeine and Lisinopril   PAST HISTORY  Family History Family History  Problem Relation Age of Onset  . Colon cancer Mother   . Heart attack Mother   . Alzheimer's disease Mother     father  . Diabetes Mother   . Heart disease Mother   . Cancer Mother     colon  . Kidney disease Mother   . Diabetes    . Heart attack    . Cancer Sister   . Migraines Sister   . Alzheimer's disease Father   . Cancer Brother   . Diabetes Daughter   . Heart disease Sister   . Heart  attack Sister   . Breast cancer Sister   . Heart disease Brother     Social History History  Substance  Use Topics  . Smoking status: Never Smoker   . Smokeless tobacco: Never Used  . Alcohol Use: No     Are there smokers in your home (other than you)? No  Risk Factors Current exercise habits: The patient does not participate in regular exercise at present.  Dietary issues discussed: low CHO and fat   Cardiac risk factors: advanced age (older than 9 for men, 15 for women), diabetes mellitus, dyslipidemia, family history of premature cardiovascular disease, hypertension, microalbuminuria, obesity (BMI >= 30 kg/m2) and sedentary lifestyle.  Depression Screen (Note: if answer to either of the following is "Yes", a more complete depression screening is indicated)   Over the past 2 weeks, have you felt down, depressed or hopeless? Yes  Over the past 2 weeks, have you felt little interest or pleasure in doing things? No  Have you lost interest or pleasure in daily life? No  Do you often feel hopeless? No  Do you cry easily over simple problems? No  Activities of Daily Living In your present state of health, do you have any difficulty performing the following activities?:  Driving? No Managing money?  No Feeding yourself? No Getting from bed to chair? No  Climbing a flight of stairs? No Preparing food and eating?: No Bathing or showering? No Getting dressed: No Getting to the toilet? No Using the toilet:No Moving around from place to place: No In the past year have you fallen or had a near fall?:No   Are you sexually active?  No  Do you have more than one partner?  No  Hearing Difficulties: No Do you often ask people to speak up or repeat themselves? No Do you experience ringing or noises in your ears? No Do you have difficulty understanding soft or whispered voices? No   Do you feel that you have a problem with memory? No  Do you often misplace items? No  Do you feel  safe at home?  Yes  Cognitive Testing  Alert? Yes  Normal Appearance?Yes  Oriented to person? Yes  Place? Yes   Time? Yes  Recall of three objects?  Yes  Can perform simple calculations? Yes  Displays appropriate judgment?Yes  Can read the correct time from a watch face?Yes   Advanced Directives have been discussed with the patient? Yes  List the Names of Other Physician/Practitioners you currently use: 1.  Hassell Done - optometrist 2.  Gessner - GI 3.  Hocherin - cardio 4.  Jerez - Pulmonary 5.  McLeod - GYN  Indicate any recent Medical Services you may have received from other than [facility] providers in the past year (date may be approximate).  Immunization History  Administered Date(s) Administered  . Influenza,inj,Quad PF,36+ Mos 08/23/2013, 09/15/2014  . Pneumococcal Conjugate-13 09/15/2014    Screening Tests Health Maintenance  Topic Date Due  . Ophthalmology Exam  02/08/1954  . Tetanus/tdap  02/09/1963  . Mammogram  02/08/1994  . Zostavax  02/09/2004  . Pneumococcal Polysaccharide Vaccine Age 30 And Over  02/08/2009  . Influenza Vaccine  07/01/2014  . Foot Exam  10/16/2014 (Originally 02/08/1956)  . Urine Microalbumin  12/23/2014  . Hemoglobin A1c  03/17/2015  . Colonoscopy  11/27/2015    All answers were reviewed with the patient and necessary referrals were made:  Cherre Robins, [facility]   09/15/2014   History reviewed: allergies, current medications, past family history, past medical history, past social history, past surgical history and problem list   Objective:  Body mass index is 44.57  kg/(m^2). BP 138/62  Pulse 70  Ht 5' 6"  (1.676 m)  Wt 276 lb (125.193 kg)  BMI 44.57 kg/m2  INR = 2.6 today A1c =  6.5% today  Assessment:     Initial Medicare Wellness Visit Type 2 DM, controlled, with peripheral neuropathy Therapeutic anticoagulation     Plan:     During the course of the visit the patient was educated and counseled about appropriate  screening and preventive services including:    Pneumococcal vaccine - given today in office  Influenza vaccine - given today in office  Td vaccine - plan to get in 1 month  Screening mammography - appt made today for November  Screening Pap smear and pelvic exam - UTD  Bone densitometry screening - ordered today  Colorectal cancer screening - UTD  Glaucoma screening - patient missed diabetic eye appt schedule for earlier this month due to outpatient surgery  Discontinue lisinopril - called [facility] and cancelled Rx  Advanced directives: caring connections packet given  Anticoagulation Dose Instructions as of 09/15/2014     Dorene Grebe Tue Wed Thu Fri Sat   New Dose 2.5 mg 5 mg 5 mg 5 mg 2.5 mg 5 mg 5 mg    Description        continue same dose of warfarin 64m -  1/2 tablet every Sunday and Thursday and 1 tablet all other days.            Patient Instructions (the written plan) was given to the patient.  Medicare Attestation I have personally reviewed: The patient's medical and social history Their use of alcohol, tobacco or illicit drugs Their current medications and supplements The patient's functional ability including ADLs,fall risks, home safety risks, cognitive, and hearing and visual impairment Diet and physical activities Evidence for depression or mood disorders  The patient's weight, height, BMI, and BP/HR have been recorded in the chart.  I have made referrals, counseling, and provided education to the patient based on review of the above and I have provided the patient with a written personalized care plan for preventive services.     [facility]re Robins [facility]  09/15/2014

## 2014-09-15 NOTE — Patient Instructions (Signed)
Health Maintenance Summary    Diabetic Eye Eam Overdue   patient to reschedule missed appoitment    TETANUS/TDAP Overdue 02/09/1963  Plan to get in 1 month     MAMMOGRAM Overdue 02/08/1994  Appointment made today    ZOSTAVAX Overdue 02/09/2004  Plan to get in 1 month    Pneumonia Vaccine Done today 09/15/2014      INFLUENZA VACCINE Done today 09/15/2014      DEXA / Bone Density Over Due  Referral made    URINE MICROALBUMIN Next Due 12/23/2014      HEMOGLOBIN A1C Done today 09/15/2014      COLONOSCOPY Next Due 11/27/2015  Last done 05/02/2011      Stop Lisnopril  Anticoagulation Dose Instructions as of 09/15/2014     [name] Tue Wed Thu Fri Sat   New Dose 2.5 mg 5 mg 5 mg 5 mg 2.5 mg 5 mg 5 mg    Description        continue same dose of warfarin 62m -  1/2 tablet every Sunday and Thursday and 1 tablet all other days.           INR was 2.6 today  Preventive Care for Adults A healthy lifestyle and preventive care can promote health and wellness. Preventive health guidelines for women include the following key practices.  A routine yearly physical is a good way to check with your health care provider about your health and preventive screening. It is a chance to share any concerns and updates on your health and to receive a thorough exam.  Visit your dentist for a routine exam and preventive care every 6 months. Brush your teeth twice a day and floss once a day. Good oral hygiene prevents tooth decay and gum disease.  The frequency of eye exams is based on your age, health, family medical history, use of contact lenses, and other factors. Follow your health care provider's recommendations for frequency of eye exams.  Eat a healthy diet. Foods like vegetables, fruits, whole grains, low-fat dairy products, and lean protein foods contain the nutrients you need without too many calories. Decrease your intake of foods high in solid fats, added sugars, and salt. Eat the right amount of  calories for you.Get information about a proper diet from your health care provider, if necessary.  Regular physical exercise is one of the most important things you can do for your health. Most adults should get at least 150 minutes of moderate-intensity exercise (any activity that increases your heart rate and causes you to sweat) each week. In addition, most adults need muscle-strengthening exercises on 2 or more days a week.  Maintain a healthy weight. The body mass index (BMI) is a screening tool to identify possible weight problems. It provides an estimate of body fat based on height and weight. Your health care provider can find your BMI and can help you achieve or maintain a healthy weight.For adults 20 years and older:  A BMI below 18.5 is considered underweight.  A BMI of 18.5 to 24.9 is normal.  A BMI of 25 to 29.9 is considered overweight.  A BMI of 30 and above is considered obese.  Maintain normal blood lipids and cholesterol levels by exercising and minimizing your intake of saturated fat. Eat a balanced diet with plenty of fruit and vegetables. Blood tests for lipids and cholesterol should begin at age 662and be repeated every 5 years. If your lipid or cholesterol levels are high, you  are over 67, or you are at high risk for heart disease, you may need your cholesterol levels checked more frequently.Ongoing high lipid and cholesterol levels should be treated with medicines if diet and exercise are not working.  If you smoke, find out from your health care provider how to quit. If you do not use tobacco, do not start.  Lung cancer screening is recommended for adults aged 44-80 years who are at high risk for developing lung cancer because of a history of smoking. A yearly low-dose CT scan of the lungs is recommended for people who have at least a 30-pack-year history of smoking and are a current smoker or have quit within the past 15 years. A pack year of smoking is smoking an  average of 1 pack of cigarettes a day for 1 year (for example: 1 pack a day for 30 years or 2 packs a day for 15 years). Yearly screening should continue until the smoker has stopped smoking for at least 15 years. Yearly screening should be stopped for people who develop a health problem that would prevent them from having lung cancer treatment.  If you are pregnant, do not drink alcohol. If you are breastfeeding, be very cautious about drinking alcohol. If you are not pregnant and choose to drink alcohol, do not have more than 1 drink per day. One drink is considered to be 12 ounces (355 mL) of beer, 5 ounces (148 mL) of wine, or 1.5 ounces (44 mL) of liquor.  Avoid use of street drugs. Do not share needles with anyone. Ask for help if you need support or instructions about stopping the use of drugs.  High blood pressure causes heart disease and increases the risk of stroke. Your blood pressure should be checked at least every 1 to 2 years. Ongoing high blood pressure should be treated with medicines if weight loss and exercise do not work.  If you are 42-80 years old, ask your health care provider if you should take aspirin to prevent strokes.  Diabetes screening involves taking a blood sample to check your fasting blood sugar level. This should be done once every 3 years, after age 22, if you are within normal weight and without risk factors for diabetes. Testing should be considered at a younger age or be carried out more frequently if you are overweight and have at least 1 risk factor for diabetes.  Breast cancer screening is essential preventive care for women. You should practice "breast self-awareness." This means understanding the normal appearance and feel of your breasts and may include breast self-examination. Any changes detected, no matter how small, should be reported to a health care provider. Women in their 36s and 30s should have a clinical breast exam (CBE) by a health care provider as  part of a regular health exam every 1 to 3 years. After age 70, women should have a CBE every year. Starting at age 62, women should consider having a mammogram (breast X-ray test) every year. Women who have a family history of breast cancer should talk to their health care provider about genetic screening. Women at a high risk of breast cancer should talk to their health care providers about having an MRI and a mammogram every year.  Breast cancer gene (BRCA)-related cancer risk assessment is recommended for women who have family members with BRCA-related cancers. BRCA-related cancers include breast, ovarian, tubal, and peritoneal cancers. Having family members with these cancers may be associated with an increased risk for harmful  changes (mutations) in the breast cancer genes BRCA1 and BRCA2. Results of the assessment will determine the need for genetic counseling and BRCA1 and BRCA2 testing.  Routine pelvic exams to screen for cancer are no longer recommended for nonpregnant women who are considered low risk for cancer of the pelvic organs (ovaries, uterus, and vagina) and who do not have symptoms. Ask your health care provider if a screening pelvic exam is right for you.  If you have had past treatment for cervical cancer or a condition that could lead to cancer, you need Pap tests and screening for cancer for at least 20 years after your treatment. If Pap tests have been discontinued, your risk factors (such as having a new sexual partner) need to be reassessed to determine if screening should be resumed. Some women have medical problems that increase the chance of getting cervical cancer. In these cases, your health care provider may recommend more frequent screening and Pap tests.  The HPV test is an additional test that may be used for cervical cancer screening. The HPV test looks for the virus that can cause the cell changes on the cervix. The cells collected during the Pap test can be tested for  HPV. The HPV test could be used to screen women aged 29 years and older, and should be used in women of any age who have unclear Pap test results. After the age of 25, women should have HPV testing at the same frequency as a Pap test.  Colorectal cancer can be detected and often prevented. Most routine colorectal cancer screening begins at the age of 47 years and continues through age 22 years. However, your health care provider may recommend screening at an earlier age if you have risk factors for colon cancer. On a yearly basis, your health care provider may provide home test kits to check for hidden blood in the stool. Use of a small camera at the end of a tube, to directly examine the colon (sigmoidoscopy or colonoscopy), can detect the earliest forms of colorectal cancer. Talk to your health care provider about this at age 64, when routine screening begins. Direct exam of the colon should be repeated every 5-10 years through age 48 years, unless early forms of pre-cancerous polyps or small growths are found.  People who are at an increased risk for hepatitis B should be screened for this virus. You are considered at high risk for hepatitis B if:  You were born in a country where hepatitis B occurs often. Talk with your health care provider about which countries are considered high risk.  Your parents were born in a high-risk country and you have not received a shot to protect against hepatitis B (hepatitis B vaccine).  You have HIV or AIDS.  You use needles to inject street drugs.  You live with, or have sex with, someone who has hepatitis B.  You get hemodialysis treatment.  You take certain medicines for conditions like cancer, organ transplantation, and autoimmune conditions.  Hepatitis C blood testing is recommended for all people born from [birth year] through 1965 and any individual with known risks for hepatitis C.  Practice safe sex. Use condoms and avoid high-risk sexual practices to  reduce the spread of sexually transmitted infections (STIs). STIs include gonorrhea, chlamydia, syphilis, trichomonas, herpes, HPV, and human immunodeficiency virus (HIV). Herpes, HIV, and HPV are viral illnesses that have no cure. They can result in disability, cancer, and death.  You should be screened for sexually transmitted  illnesses (STIs) including gonorrhea and chlamydia if:  You are sexually active and are younger than 24 years.  You are older than 24 years and your health care provider tells you that you are at risk for this type of infection.  Your sexual activity has changed since you were last screened and you are at an increased risk for chlamydia or gonorrhea. Ask your health care provider if you are at risk.  If you are at risk of being infected with HIV, it is recommended that you take a prescription medicine daily to prevent HIV infection. This is called preexposure prophylaxis (PrEP). You are considered at risk if:  You are a heterosexual woman, are sexually active, and are at increased risk for HIV infection.  You take drugs by injection.  You are sexually active with a partner who has HIV.  Talk with your health care provider about whether you are at high risk of being infected with HIV. If you choose to begin PrEP, you should first be tested for HIV. You should then be tested every 3 months for as long as you are taking PrEP.  Osteoporosis is a disease in which the bones lose minerals and strength with aging. This can result in serious bone fractures or breaks. The risk of osteoporosis can be identified using a bone density scan. Women ages 1 years and over and women at risk for fractures or osteoporosis should discuss screening with their health care providers. Ask your health care provider whether you should take a calcium supplement or vitamin D to reduce the rate of osteoporosis.  Menopause can be associated with physical symptoms and risks. Hormone replacement  therapy is available to decrease symptoms and risks. You should talk to your health care provider about whether hormone replacement therapy is right for you.  Use sunscreen. Apply sunscreen liberally and repeatedly throughout the day. You should seek shade when your shadow is shorter than you. Protect yourself by wearing long sleeves, pants, a wide-brimmed hat, and sunglasses year round, whenever you are outdoors.  Once a month, do a whole body skin exam, using a mirror to look at the skin on your back. Tell your health care provider of new moles, moles that have irregular borders, moles that are larger than a pencil eraser, or moles that have changed in shape or color.  Stay current with required vaccines (immunizations).  Influenza vaccine. All adults should be immunized every year.  Tetanus, diphtheria, and acellular pertussis (Td, Tdap) vaccine. Pregnant women should receive 1 dose of Tdap vaccine during each pregnancy. The dose should be obtained regardless of the length of time since the last dose. Immunization is preferred during the 27th-36th week of gestation. An adult who has not previously received Tdap or who does not know her vaccine status should receive 1 dose of Tdap. This initial dose should be followed by tetanus and diphtheria toxoids (Td) booster doses every 10 years. Adults with an unknown or incomplete history of completing a 3-dose immunization series with Td-containing vaccines should begin or complete a primary immunization series including a Tdap dose. Adults should receive a Td booster every 10 years.  Varicella vaccine. An adult without evidence of immunity to varicella should receive 2 doses or a second dose if she has previously received 1 dose. Pregnant females who do not have evidence of immunity should receive the first dose after pregnancy. This first dose should be obtained before leaving the health care facility. The second dose should be obtained 4-8  weeks after the  first dose.  Human papillomavirus (HPV) vaccine. Females aged 13-26 years who have not received the vaccine previously should obtain the 3-dose series. The vaccine is not recommended for use in pregnant females. However, pregnancy testing is not needed before receiving a dose. If a female is found to be pregnant after receiving a dose, no treatment is needed. In that case, the remaining doses should be delayed until after the pregnancy. Immunization is recommended for any person with an immunocompromised condition through the age of 66 years if she did not get any or all doses earlier. During the 3-dose series, the second dose should be obtained 4-8 weeks after the first dose. The third dose should be obtained 24 weeks after the first dose and 16 weeks after the second dose.  Zoster vaccine. One dose is recommended for adults aged 43 years or older unless certain conditions are present.  Measles, mumps, and rubella (MMR) vaccine. Adults born before [birth year] generally are considered immune to measles and mumps. Adults born in [birth year] or later should have 1 or more doses of MMR vaccine unless there is a contraindication to the vaccine or there is laboratory evidence of immunity to each of the three diseases. A routine second dose of MMR vaccine should be obtained at least 28 days after the first dose for students attending postsecondary schools, health care workers, or international travelers. People who received inactivated measles vaccine or an unknown type of measles vaccine during 1963-1967 should receive 2 doses of MMR vaccine. People who received inactivated mumps vaccine or an unknown type of mumps vaccine before 1979 and are at high risk for mumps infection should consider immunization with 2 doses of MMR vaccine. For females of childbearing age, rubella immunity should be determined. If there is no evidence of immunity, females who are not pregnant should be vaccinated. If there is no evidence of immunity,  females who are pregnant should delay immunization until after pregnancy. Unvaccinated health care workers born before [birth year] who lack laboratory evidence of measles, mumps, or rubella immunity or laboratory confirmation of disease should consider measles and mumps immunization with 2 doses of MMR vaccine or rubella immunization with 1 dose of MMR vaccine.  Pneumococcal 13-valent conjugate (PCV13) vaccine. When indicated, a person who is uncertain of her immunization history and has no record of immunization should receive the PCV13 vaccine. An adult aged 51 years or older who has certain medical conditions and has not been previously immunized should receive 1 dose of PCV13 vaccine. This PCV13 should be followed with a dose of pneumococcal polysaccharide (PPSV23) vaccine. The PPSV23 vaccine dose should be obtained at least 8 weeks after the dose of PCV13 vaccine. An adult aged 51 years or older who has certain medical conditions and previously received 1 or more doses of PPSV23 vaccine should receive 1 dose of PCV13. The PCV13 vaccine dose should be obtained 1 or more years after the last PPSV23 vaccine dose.  Pneumococcal polysaccharide (PPSV23) vaccine. When PCV13 is also indicated, PCV13 should be obtained first. All adults aged 55 years and older should be immunized. An adult younger than age 9 years who has certain medical conditions should be immunized. Any person who resides in a nursing home or long-term care facility should be immunized. An adult smoker should be immunized. People with an immunocompromised condition and certain other conditions should receive both PCV13 and PPSV23 vaccines. People with human immunodeficiency virus (HIV) infection should be immunized as soon as possible after  diagnosis. Immunization during chemotherapy or radiation therapy should be avoided. Routine use of PPSV23 vaccine is not recommended for American Indians, [place] Natives, or people younger than 65 years unless there  are medical conditions that require PPSV23 vaccine. When indicated, people who have unknown immunization and have no record of immunization should receive PPSV23 vaccine. One-time revaccination 5 years after the first dose of PPSV23 is recommended for people aged 19-64 years who have chronic kidney failure, nephrotic syndrome, asplenia, or immunocompromised conditions. People who received 1-2 doses of PPSV23 before age 52 years should receive another dose of PPSV23 vaccine at age 22 years or later if at least 5 years have passed since the previous dose. Doses of PPSV23 are not needed for people immunized with PPSV23 at or after age 89 years.  Meningococcal vaccine. Adults with asplenia or persistent complement component deficiencies should receive 2 doses of quadrivalent meningococcal conjugate (MenACWY-D) vaccine. The doses should be obtained at least 2 months apart. Microbiologists working with certain meningococcal bacteria, [employer] recruits, people at risk during an outbreak, and people who travel to or live in countries with a high rate of meningitis should be immunized. A first-year college student up through age 66 years who is living in a residence hall should receive a dose if she did not receive a dose on or after her 16th birthday. Adults who have certain high-risk conditions should receive one or more doses of vaccine.  Hepatitis A vaccine. Adults who wish to be protected from this disease, have certain high-risk conditions, work with hepatitis A-infected animals, work in hepatitis A research labs, or travel to or work in countries with a high rate of hepatitis A should be immunized. Adults who were previously unvaccinated and who anticipate close contact with an international adoptee during the first 60 days after arrival in the [place] from a country with a high rate of hepatitis A should be immunized.  Hepatitis B vaccine. Adults who wish to be protected from this disease, have certain  high-risk conditions, may be exposed to blood or other infectious body fluids, are household contacts or sex partners of hepatitis B positive people, are clients or workers in certain care facilities, or travel to or work in countries with a high rate of hepatitis B should be immunized.  Preventive Services / Frequency AAges 65 years and over  Blood pressure check.** / Every 1 to 2 years.  Lipid and cholesterol check.** / Every 5 years beginning at age 65 years.  Lung cancer screening. / Every year if you are aged 92-80 years and have a 30-pack-year history of smoking and currently smoke or have quit within the past 15 years. Yearly screening is stopped once you have quit smoking for at least 15 years or develop a health problem that would prevent you from having lung cancer treatment.  Clinical breast exam.** / Every year after age 29 years.  BRCA-related cancer risk assessment.** / For women who have family members with a BRCA-related cancer (breast, ovarian, tubal, or peritoneal cancers).  Mammogram.** / Every year beginning at age 28 years and continuing for as long as you are in good health. Consult with your health care provider.  Pap test.** / Every 3 years starting at age 75 years through age 34 or 65 years with 3 consecutive normal Pap tests. Testing can be stopped between 65 and 70 years with 3 consecutive normal Pap tests and no abnormal Pap or HPV tests in the past 10 years.  HPV screening.** /  Every 3 years from ages 47 years through ages 95 or 65 years with a history of 3 consecutive normal Pap tests. Testing can be stopped between 65 and 70 years with 3 consecutive normal Pap tests and no abnormal Pap or HPV tests in the past 10 years.  Fecal occult blood test (FOBT) of stool. / Every year beginning at age 49 years and continuing until age 71 years. You may not need to do this test if you get a colonoscopy every 10 years.  Flexible sigmoidoscopy or colonoscopy.** / Every 5  years for a flexible sigmoidoscopy or every 10 years for a colonoscopy beginning at age 20 years and continuing until age 39 years.  Hepatitis C blood test.** / For all people born from [birth year] through 1967 and any individual with known risks for hepatitis C.  Osteoporosis screening.** / A one-time screening for women ages 108 years and over and women at risk for fractures or osteoporosis.  Skin self-exam. / Monthly.  Influenza vaccine. / Every year.  Tetanus, diphtheria, and acellular pertussis (Tdap/Td) vaccine.** / 1 dose of Td every 10 years.  Varicella vaccine.** / Consult your health care provider.  Zoster vaccine.** / 1 dose for adults aged 74 years or older.  Pneumococcal 13-valent conjugate (PCV13) vaccine.** / Consult your health care provider.  Pneumococcal polysaccharide (PPSV23) vaccine.** / 1 dose for all adults aged 33 years and older.  Meningococcal vaccine.** / Consult your health care provider.  Hepatitis A vaccine.** / Consult your health care provider.  Hepatitis B vaccine.** / Consult your health care provider.  Haemophilus influenzae type b (Hib) vaccine.** / Consult your health care provider. ** Family history and personal history of risk and conditions may change your health care provider's recommendations. Document Released: 01/13/2002 Document Revised: 04/03/2014 Document Reviewed: 04/14/2011 [facility] Patient Information 2015 [place], [place]. This information is not intended to replace advice given to you by your health care provider. Make sure you discuss any questions you have with your health care provider.

## 2014-09-15 NOTE — Addendum Note (Signed)
Addended by: Cherre Robins on: 09/15/2014 04:30 PM   Modules accepted: Orders

## 2014-09-18 ENCOUNTER — Encounter: Payer: Self-pay | Admitting: Nurse Practitioner

## 2014-09-20 ENCOUNTER — Telehealth: Payer: Self-pay | Admitting: Nurse Practitioner

## 2014-09-20 ENCOUNTER — Encounter: Payer: Self-pay | Admitting: Nurse Practitioner

## 2014-09-20 NOTE — Telephone Encounter (Signed)
Patients daughter aware that we would need something stating that this is [place] address and this is where she resides. Patient daughter states that she owns the property but her permanent residency is the [place] but they want the address changed to [address] [place]. Ronnald Collum also stated that she would need proof that her address is currently [address]

## 2014-09-23 ENCOUNTER — Other Ambulatory Visit: Payer: Self-pay | Admitting: Nurse Practitioner

## 2014-10-02 ENCOUNTER — Ambulatory Visit (INDEPENDENT_AMBULATORY_CARE_PROVIDER_SITE_OTHER): Payer: Medicare HMO | Admitting: Family Medicine

## 2014-10-02 ENCOUNTER — Ambulatory Visit (INDEPENDENT_AMBULATORY_CARE_PROVIDER_SITE_OTHER): Payer: Medicare HMO

## 2014-10-02 ENCOUNTER — Encounter: Payer: Self-pay | Admitting: Family Medicine

## 2014-10-02 VITALS — BP 132/56 | HR 76 | Temp 97.7°F | Ht 66.0 in | Wt 276.4 lb

## 2014-10-02 DIAGNOSIS — M542 Cervicalgia: Secondary | ICD-10-CM

## 2014-10-02 MED ORDER — HYDROCODONE-ACETAMINOPHEN 5-325 MG PO TABS: 1.0000 | ORAL_TABLET | Freq: Four times a day (QID) | ORAL | Status: DC | PRN

## 2014-10-02 NOTE — Progress Notes (Signed)
   Subjective:    Patient ID: [name], female    DOB: January 10, 1946, 68 y.o.   MRN: [id number]  HPI C/o neck pain radiating to left shoulder. She has hx of DDD of the cervical spine.  She states the pain is severe.  Review of Systems  Constitutional: Negative for fever.  HENT: Negative for ear pain.   Eyes: Negative for discharge.  Respiratory: Negative for cough.   Cardiovascular: Negative for chest pain.  Gastrointestinal: Negative for abdominal distention.  Endocrine: Negative for polyuria.  Genitourinary: Negative for difficulty urinating.  Musculoskeletal: Positive for neck pain. Negative for gait problem.  Skin: Negative for color change and rash.  Neurological: Negative for speech difficulty and headaches.  Psychiatric/Behavioral: Negative for agitation.       Objective:    BP 132/56 mmHg  Pulse 76  Temp(Src) 97.7 F (36.5 C) (Oral)  Ht 5\' 6"  (1.676 m)  Wt 276 lb 6.4 oz (125.374 kg)  BMI 44.63 kg/m2 Physical Exam  Constitutional: She is oriented to person, place, and time. She appears well-developed and well-nourished.  HENT:  Head: Normocephalic and atraumatic.  Mouth/Throat: Oropharynx is clear and moist.  Eyes: Pupils are equal, round, and reactive to light.  Neck: Normal range of motion. Neck supple.  Cardiovascular: Normal rate and regular rhythm.   No murmur heard. Pulmonary/Chest: Effort normal and breath sounds normal.  Abdominal: Soft. Bowel sounds are normal. There is no tenderness.  Musculoskeletal: She exhibits tenderness.  TTP cervical spine  Neurological: She is alert and oriented to person, place, and time.  Skin: Skin is warm and dry.  Psychiatric: She has a normal mood and affect.          Assessment & Plan:     ICD-9-CM ICD-10-CM   1. Cervical pain (neck) 723.1 M54.2 DG Cervical Spine Complete     No Follow-up on file.  Lysbeth Penner FNP

## 2014-10-04 ENCOUNTER — Other Ambulatory Visit: Payer: Self-pay | Admitting: Pharmacist

## 2014-10-04 DIAGNOSIS — Z13820 Encounter for screening for osteoporosis: Secondary | ICD-10-CM

## 2014-10-19 ENCOUNTER — Ambulatory Visit (INDEPENDENT_AMBULATORY_CARE_PROVIDER_SITE_OTHER): Payer: Medicare HMO | Admitting: Pharmacist

## 2014-10-19 ENCOUNTER — Other Ambulatory Visit: Payer: Self-pay | Admitting: Pharmacist

## 2014-10-19 DIAGNOSIS — R791 Abnormal coagulation profile: Secondary | ICD-10-CM

## 2014-10-19 DIAGNOSIS — I482 Chronic atrial fibrillation, unspecified: Secondary | ICD-10-CM

## 2014-10-19 DIAGNOSIS — I4891 Unspecified atrial fibrillation: Secondary | ICD-10-CM

## 2014-10-19 DIAGNOSIS — S72142D Displaced intertrochanteric fracture of left femur, subsequent encounter for closed fracture with routine healing: Secondary | ICD-10-CM

## 2014-10-19 DIAGNOSIS — R7889 Finding of other specified substances, not normally found in blood: Secondary | ICD-10-CM

## 2014-10-19 LAB — POCT INR: INR: 5.4

## 2014-10-19 MED ORDER — ROSUVASTATIN CALCIUM 10 MG PO TABS: 10.0000 mg | ORAL_TABLET | Freq: Every day | ORAL | Status: DC

## 2014-10-19 NOTE — Patient Instructions (Signed)
Anticoagulation Dose Instructions as of 10/19/2014      Dorene Grebe Tue Wed Thu Fri Sat   New Dose 2.5 mg 5 mg 2.5 mg 5 mg 2.5 mg 5 mg 2.5 mg    Description         No warfarin today or tomorrow.  Then decreased dose to continue same dose of warfarin 5mg  - 1 tablet on mondays, wednesdays and fridays.  Take 1/2 tablet all other days.         INR was 5.4 today

## 2014-10-19 NOTE — Addendum Note (Signed)
Addended by: Pollyann Kennedy F on: 10/19/2014 04:19 PM   Modules accepted: Orders

## 2014-10-20 ENCOUNTER — Telehealth: Payer: Self-pay | Admitting: Pharmacist

## 2014-10-20 LAB — CBC WITH DIFFERENTIAL
BASOS: 0 %
Basophils Absolute: 0 10*3/uL (ref 0.0–0.2)
EOS ABS: 0.4 10*3/uL (ref 0.0–0.4)
EOS: 5 %
HEMATOCRIT: 31.2 % — AB (ref 34.0–46.6)
HEMOGLOBIN: 10.2 g/dL — AB (ref 11.1–15.9)
IMMATURE GRANS (ABS): 0 10*3/uL (ref 0.0–0.1)
Immature Granulocytes: 0 %
Lymphocytes Absolute: 1.2 10*3/uL (ref 0.7–3.1)
Lymphs: 16 %
MCH: 29.1 pg (ref 26.6–33.0)
MCHC: 32.7 g/dL (ref 31.5–35.7)
MCV: 89 fL (ref 79–97)
Monocytes Absolute: 0.4 10*3/uL (ref 0.1–0.9)
Monocytes: 6 %
NEUTROS ABS: 5.6 10*3/uL (ref 1.4–7.0)
NEUTROS PCT: 73 %
Platelets: 193 10*3/uL (ref 150–379)
RBC: 3.51 x10E6/uL — AB (ref 3.77–5.28)
RDW: 16 % — ABNORMAL HIGH (ref 12.3–15.4)
WBC: 7.7 10*3/uL (ref 3.4–10.8)

## 2014-10-20 MED ORDER — PEN NEEDLES 31G X 6 MM MISC: Status: DC

## 2014-10-20 NOTE — Telephone Encounter (Signed)
Patient notified of labs from yesterday.   HBG was 10.2.  Continue iron supplementation.  Will recheck HBG in 1 week.

## 2014-10-30 ENCOUNTER — Ambulatory Visit (INDEPENDENT_AMBULATORY_CARE_PROVIDER_SITE_OTHER): Payer: Commercial Managed Care - HMO | Admitting: Pharmacist

## 2014-10-30 DIAGNOSIS — I482 Chronic atrial fibrillation, unspecified: Secondary | ICD-10-CM

## 2014-10-30 DIAGNOSIS — I4891 Unspecified atrial fibrillation: Secondary | ICD-10-CM

## 2014-10-30 LAB — POCT INR: INR: 1.8

## 2014-10-30 NOTE — Patient Instructions (Signed)
Anticoagulation Dose Instructions as of 10/30/2014      Dorene Grebe Tue Wed Thu Fri Sat   New Dose 2.5 mg 5 mg 5 mg 5 mg 2.5 mg 5 mg 2.5 mg    Description        Increase dose to warfarin 5mg  - 1 tablet on mondays, tuesdays, wednesdays and fridays.  Take 1/2 tablet all other days.         INR was 1.8 today

## 2014-11-20 ENCOUNTER — Ambulatory Visit (INDEPENDENT_AMBULATORY_CARE_PROVIDER_SITE_OTHER): Payer: Commercial Managed Care - HMO | Admitting: Pharmacist

## 2014-11-20 DIAGNOSIS — I482 Chronic atrial fibrillation, unspecified: Secondary | ICD-10-CM

## 2014-11-20 DIAGNOSIS — I4891 Unspecified atrial fibrillation: Secondary | ICD-10-CM

## 2014-11-20 LAB — POCT INR: INR: 2

## 2014-11-20 NOTE — Patient Instructions (Signed)
Anticoagulation Dose Instructions as of 11/20/2014      Dorene Grebe Tue Wed Thu Fri Sat   New Dose 2.5 mg 5 mg 5 mg 5 mg 2.5 mg 5 mg 2.5 mg    Description        Continue current warfarin 5mg  - 1 tablet on mondays, tuesdays, wednesdays and fridays.  Take 1/2 tablet all other days.         INR was 2.0 today

## 2014-11-25 ENCOUNTER — Other Ambulatory Visit: Payer: Self-pay | Admitting: Family

## 2014-12-03 ENCOUNTER — Other Ambulatory Visit: Payer: Self-pay | Admitting: Nurse Practitioner

## 2014-12-05 DIAGNOSIS — G4733 Obstructive sleep apnea (adult) (pediatric): Secondary | ICD-10-CM | POA: Diagnosis not present

## 2014-12-05 DIAGNOSIS — G4736 Sleep related hypoventilation in conditions classified elsewhere: Secondary | ICD-10-CM | POA: Diagnosis not present

## 2014-12-05 DIAGNOSIS — R0902 Hypoxemia: Secondary | ICD-10-CM | POA: Diagnosis not present

## 2014-12-05 DIAGNOSIS — I509 Heart failure, unspecified: Secondary | ICD-10-CM | POA: Diagnosis not present

## 2014-12-06 ENCOUNTER — Other Ambulatory Visit: Payer: Self-pay | Admitting: Nurse Practitioner

## 2014-12-07 NOTE — Telephone Encounter (Signed)
Last seen 10/02/14 [facility] [place]  This med not on EPIC list

## 2014-12-10 DIAGNOSIS — J449 Chronic obstructive pulmonary disease, unspecified: Secondary | ICD-10-CM | POA: Diagnosis not present

## 2015-01-01 ENCOUNTER — Other Ambulatory Visit: Payer: Self-pay | Admitting: Family

## 2015-01-05 DIAGNOSIS — R0902 Hypoxemia: Secondary | ICD-10-CM | POA: Diagnosis not present

## 2015-01-05 DIAGNOSIS — G4736 Sleep related hypoventilation in conditions classified elsewhere: Secondary | ICD-10-CM | POA: Diagnosis not present

## 2015-01-05 DIAGNOSIS — G4733 Obstructive sleep apnea (adult) (pediatric): Secondary | ICD-10-CM | POA: Diagnosis not present

## 2015-01-05 DIAGNOSIS — I509 Heart failure, unspecified: Secondary | ICD-10-CM | POA: Diagnosis not present

## 2015-01-10 DIAGNOSIS — J449 Chronic obstructive pulmonary disease, unspecified: Secondary | ICD-10-CM | POA: Diagnosis not present

## 2015-01-22 ENCOUNTER — Ambulatory Visit (INDEPENDENT_AMBULATORY_CARE_PROVIDER_SITE_OTHER): Payer: Commercial Managed Care - HMO | Admitting: Pharmacist

## 2015-01-22 DIAGNOSIS — I482 Chronic atrial fibrillation, unspecified: Secondary | ICD-10-CM

## 2015-01-22 DIAGNOSIS — I4891 Unspecified atrial fibrillation: Secondary | ICD-10-CM

## 2015-01-22 NOTE — Patient Instructions (Signed)
Anticoagulation Dose Instructions as of 01/22/2015      Dorene Grebe Tue Wed Thu Fri Sat   New Dose 2.5 mg 5 mg 5 mg 5 mg 2.5 mg 5 mg 2.5 mg    Description        Take 1 and 1/2 tablets today - Monday, February 22nd.  Then continue current warfarin 5mg  - 1 tablet on mondays, tuesdays, wednesdays and fridays.  Take 1/2 tablet all other days.         INR was 1.8 today

## 2015-01-29 ENCOUNTER — Other Ambulatory Visit: Payer: Self-pay | Admitting: Family Medicine

## 2015-02-03 DIAGNOSIS — R0902 Hypoxemia: Secondary | ICD-10-CM | POA: Diagnosis not present

## 2015-02-03 DIAGNOSIS — I509 Heart failure, unspecified: Secondary | ICD-10-CM | POA: Diagnosis not present

## 2015-02-03 DIAGNOSIS — G4733 Obstructive sleep apnea (adult) (pediatric): Secondary | ICD-10-CM | POA: Diagnosis not present

## 2015-02-03 DIAGNOSIS — G4736 Sleep related hypoventilation in conditions classified elsewhere: Secondary | ICD-10-CM | POA: Diagnosis not present

## 2015-02-08 DIAGNOSIS — J449 Chronic obstructive pulmonary disease, unspecified: Secondary | ICD-10-CM | POA: Diagnosis not present

## 2015-02-19 ENCOUNTER — Other Ambulatory Visit: Payer: Self-pay | Admitting: Pharmacist

## 2015-02-19 DIAGNOSIS — Z780 Asymptomatic menopausal state: Secondary | ICD-10-CM

## 2015-02-20 ENCOUNTER — Other Ambulatory Visit: Payer: Self-pay | Admitting: Family Medicine

## 2015-02-20 ENCOUNTER — Other Ambulatory Visit: Payer: Self-pay | Admitting: Nurse Practitioner

## 2015-02-20 ENCOUNTER — Other Ambulatory Visit: Payer: Self-pay | Admitting: Family

## 2015-02-21 ENCOUNTER — Other Ambulatory Visit: Payer: Self-pay

## 2015-02-21 ENCOUNTER — Ambulatory Visit (INDEPENDENT_AMBULATORY_CARE_PROVIDER_SITE_OTHER): Payer: Commercial Managed Care - HMO

## 2015-02-21 ENCOUNTER — Ambulatory Visit (INDEPENDENT_AMBULATORY_CARE_PROVIDER_SITE_OTHER): Payer: Commercial Managed Care - HMO | Admitting: Pharmacist

## 2015-02-21 ENCOUNTER — Other Ambulatory Visit: Payer: Commercial Managed Care - HMO

## 2015-02-21 ENCOUNTER — Encounter: Payer: Self-pay | Admitting: Pharmacist

## 2015-02-21 VITALS — Ht 66.0 in | Wt 291.0 lb

## 2015-02-21 DIAGNOSIS — M84459D Pathological fracture, hip, unspecified, subsequent encounter for fracture with routine healing: Secondary | ICD-10-CM

## 2015-02-21 DIAGNOSIS — I4891 Unspecified atrial fibrillation: Secondary | ICD-10-CM

## 2015-02-21 DIAGNOSIS — M810 Age-related osteoporosis without current pathological fracture: Secondary | ICD-10-CM | POA: Diagnosis not present

## 2015-02-21 DIAGNOSIS — I482 Chronic atrial fibrillation, unspecified: Secondary | ICD-10-CM

## 2015-02-21 DIAGNOSIS — N939 Abnormal uterine and vaginal bleeding, unspecified: Secondary | ICD-10-CM

## 2015-02-21 DIAGNOSIS — Z780 Asymptomatic menopausal state: Secondary | ICD-10-CM

## 2015-02-21 DIAGNOSIS — M80059D Age-related osteoporosis with current pathological fracture, unspecified femur, subsequent encounter for fracture with routine healing: Secondary | ICD-10-CM

## 2015-02-21 LAB — POCT INR: INR: 1.8

## 2015-02-21 NOTE — Progress Notes (Signed)
Patient ID: [name], female   DOB: Nov 26, 1946, 69 y.o.   MRN: [id number]  [facility] Current Height: Height: 5\' 6"  (167.6 cm)      Max Lifetime Height:  5 6" Current Weight: Weight: 291 lb (131.997 kg)       Ethnicity:Caucasian    HPI: History of hip fracture with decreased BMD in past.   Back Pain?  Yes       Kyphosis?  No Prior fracture?  Yes - hip fracture 05/2013 Med(s) for Osteoporosis/Osteopenia:  alendronate 70mg  1 tablet weekly                                                              PMH: Age at menopause:  69 yo  Patient has endometrial polyop removed 2015 Hysterectomy?  No Oophorectomy?  No HRT? No Steroid Use?  No Thyroid med?  No History of cancer?  No History of digestive disorders (ie Crohn's)?  No Current or previous eating disorders?  No Last Vitamin D Result:  longer than 2 years ago Last GFR Result:  31 (08/28/2014)   FH/SH: Family history of osteoporosis?  No Parent with history of hip fracture?  No Family history of breast cancer?  No Exercise?  No Smoking?  No Alcohol?  No    Calcium Assessment Calcium Intake  # of servings/day  Calcium mg  Milk (8 oz) 0  x  300  = 0  Yogurt (4 oz) 1 x  200 = 200mg   Cheese (1 oz) 1 x  200 = 200mg   Other Calcium sources   250mg   Ca supplement 0 = 0   Estimated calcium intake per day 650mg     DEXA Results Date of Test T-Score for AP Spine L1-L4 T-Score for Total Left Hip T-Score for Total Right Hip  02/21/2015 -0.7 -- -1.8  11/20/2010 -0.6 -- -1.2  11/15/2008 -1.1 -- -0.6  03/09/2006 -1.6 -- -0.7   INR was 1.8 today  Assessment: Osteoporosis / osteopenia per DEXA but with history of hip fracture Subtherapeutic anticoagulation  Recommendations: 1.  Continue alendronate (FOSAMAX) 70mg  1 tablet weekly 2.  recommend calcium 1200mg  daily through supplementation or diet.  3.  recommend weight bearing exercise - 30 minutes at least 4 days per week.   4.  Counseled and educated about  fall risk and prevention. 5.   Anticoagulation Dose Instructions as of 02/21/2015      Dorene Grebe Tue Wed Thu Fri Sat   New Dose 2.5 mg 5 mg 5 mg 5 mg 2.5 mg 5 mg 5 mg    Description        Take 1 and 1/2 tablets today - Wednesday, March 23rd, then increase dose of warfarin 5mg  to 1/2 tablet on sundays and thursdays only and 1 tablet all other days.         Recheck DEXA:  2 years  Time spent counseling patient:  30 minutes  Cherre Robins, PharmD, CPP

## 2015-02-21 NOTE — Patient Instructions (Addendum)
Anticoagulation Dose Instructions as of 02/21/2015      Dorene Grebe Tue Wed Thu Fri Sat   New Dose 2.5 mg 5 mg 5 mg 5 mg 2.5 mg 5 mg 5 mg    Description        Take 1 and 1/2 tablets today - Wednesday, March 23rd, then increase dose of warfarin 28m to 1/2 tablet on sundays and thursdays only and 1 tablet all other days.        Fall Prevention and Home Safety Falls cause injuries and can affect all age groups. It is possible to use preventive measures to significantly decrease the likelihood of falls. There are many simple measures which can make your home safer and prevent falls. OUTDOORS  Repair cracks and edges of walkways and driveways.  Remove high doorway thresholds.  Trim shrubbery on the main path into your home.  Have good outside lighting.  Clear walkways of tools, rocks, debris, and clutter.  Check that handrails are not broken and are securely fastened. Both sides of steps should have handrails.  Have leaves, snow, and ice cleared regularly.  Use sand or salt on walkways during winter months.  In the garage, clean up grease or oil spills. BATHROOM  Install night lights.  Install grab bars by the toilet and in the tub and shower.  Use non-skid mats or decals in the tub or shower.  Place a plastic non-slip stool in the shower to sit on, if needed.  Keep floors dry and clean up all water on the floor immediately.  Remove soap buildup in the tub or shower on a regular basis.  Secure bath mats with non-slip, double-sided rug tape.  Remove throw rugs and tripping hazards from the floors. BEDROOMS  Install night lights.  Make sure a bedside light is easy to reach.  Do not use oversized bedding.  Keep a telephone by your bedside.  Have a firm chair with side arms to use for getting dressed.  Remove throw rugs and tripping hazards from the floor. KITCHEN  Keep handles on pots and pans turned toward the center of the stove. Use back burners when  possible.  Clean up spills quickly and allow time for drying.  Avoid walking on wet floors.  Avoid hot utensils and knives.  Position shelves so they are not too high or low.  Place commonly used objects within easy reach.  If necessary, use a sturdy step stool with a grab bar when reaching.  Keep electrical cables out of the way.  Do not use floor polish or wax that makes floors slippery. If you must use wax, use non-skid floor wax.  Remove throw rugs and tripping hazards from the floor. STAIRWAYS  Never leave objects on stairs.  Place handrails on both sides of stairways and use them. Fix any loose handrails. Make sure handrails on both sides of the stairways are as long as the stairs.  Check carpeting to make sure it is firmly attached along stairs. Make repairs to worn or loose carpet promptly.  Avoid placing throw rugs at the top or bottom of stairways, or properly secure the rug with carpet tape to prevent slippage. Get rid of throw rugs, if possible.  Have an electrician put in a light switch at the top and bottom of the stairs. OTHER FALL PREVENTION TIPS  Wear low-heel or rubber-soled shoes that are supportive and fit well. Wear closed toe shoes.  When using a stepladder, make sure it is fully opened  and both spreaders are firmly locked. Do not climb a closed stepladder.  Add color or contrast paint or tape to grab bars and handrails in your home. Place contrasting color strips on first and last steps.  Learn and use mobility aids as needed. Install an electrical emergency response system.  Turn on lights to avoid dark areas. Replace light bulbs that burn out immediately. Get light switches that glow.  Arrange furniture to create clear pathways. Keep furniture in the same place.  Firmly attach carpet with non-skid or double-sided tape.  Eliminate uneven floor surfaces.  Select a carpet pattern that does not visually hide the edge of steps.  Be aware of all  pets. OTHER HOME SAFETY TIPS  Set the water temperature for 120 F (48.8 C).  Keep emergency numbers on or near the telephone.  Keep smoke detectors on every level of the home and near sleeping areas. Document Released: 11/07/2002 Document Revised: 05/18/2012 Document Reviewed: 02/06/2012 [facility] Patient Information 2015 [place], [place]. This information is not intended to replace advice given to you by your health care provider. Make sure you discuss any questions you have with your health care provider.                Exercise for Strong Bones  Exercise is important to build and maintain strong bones / bone density.  There are 2 types of exercises that are important to building and maintaining strong bones:  Weight- bearing and muscle-stregthening.  Weight-bearing Exercises  These exercises include activities that make you move against gravity while staying upright. Weight-bearing exercises can be high-impact or low-impact.  High-impact weight-bearing exercises help build bones and keep them strong. If you have broken a bone due to osteoporosis or are at risk of breaking a bone, you may need to avoid high-impact exercises. If you're not sure, you should check with your healthcare provider.  Examples of high-impact weight-bearing exercises are: Dancing  Doing high-impact aerobics  Hiking  Jogging/running  Jumping Rope  Stair climbing  Tennis  Low-impact weight-bearing exercises can also help keep bones strong and are a safe alternative if you cannot do high-impact exercises.   Examples of low-impact weight-bearing exercises are: Using elliptical training machines  Doing low-impact aerobics  Using stair-step machines  Fast walking on a treadmill or outside   Muscle-Strengthening Exercises These exercises include activities where you move your body, a weight or some other resistance against gravity. They are also known as resistance exercises and include: Lifting weights   Using elastic exercise bands  Using weight machines  Lifting your own body weight  Functional movements, such as standing and rising up on your toes  Yoga and Pilates can also improve strength, balance and flexibility. However, certain positions may not be safe for people with osteoporosis or those at increased risk of broken bones. For example, exercises that have you bend forward may increase the chance of breaking a bone in the spine.   Non-Impact Exercises There are other types of exercises that can help prevent falls.  Non-impact exercises can help you to improve balance, posture and how well you move in everyday activities. Some of these exercises include: Balance exercises that strengthen your legs and test your balance, such as Tai Chi, can decrease your risk of falls.  Posture exercises that improve your posture and reduce rounded or "sloping" shoulders can help you decrease the chance of breaking a bone, especially in the spine.  Functional exercises that improve how well you move can  help you with everyday activities and decrease your chance of falling and breaking a bone. For example, if you have trouble getting up from a chair or climbing stairs, you should do these activities as exercises.   **A physical therapist can teach you balance, posture and functional exercises. He/she can also help you learn which exercises are safe and appropriate for you.  [facility] has a physical therapy office in [place] in front of our office and referrals can be made for assessments and treatment as needed and strength and balance training.  If you would like to have an assessment with [facility] and our physical therapy team please let a nurse or provider know.

## 2015-02-22 ENCOUNTER — Telehealth: Payer: Self-pay | Admitting: Pharmacist

## 2015-02-22 LAB — VITAMIN D 25 HYDROXY (VIT D DEFICIENCY, FRACTURES): Vit D, 25-Hydroxy: 15 ng/mL — ABNORMAL LOW (ref 30.0–100.0)

## 2015-02-22 LAB — BMP8+EGFR
BUN/Creatinine Ratio: 15 (ref 11–26)
BUN: 16 mg/dL (ref 8–27)
CO2: 26 mmol/L (ref 18–29)
Calcium: 9 mg/dL (ref 8.7–10.3)
Chloride: 101 mmol/L (ref 97–108)
Creatinine, Ser: 1.04 mg/dL — ABNORMAL HIGH (ref 0.57–1.00)
GFR calc Af Amer: 62 mL/min/{1.73_m2} (ref >59)
GFR calc non Af Amer: 54 mL/min/{1.73_m2} — ABNORMAL LOW (ref >59)
GLUCOSE: 155 mg/dL — AB (ref 65–99)
POTASSIUM: 4.5 mmol/L (ref 3.5–5.2)
Sodium: 145 mmol/L — ABNORMAL HIGH (ref 134–144)

## 2015-02-22 NOTE — Telephone Encounter (Signed)
Serum creatine improved - BG elevated and sodium elevated.  Reduce salt intake.  Low vitamin D - recommend patient start OTC vitamin D 1000IU once daily.  Tried to call - no answer.

## 2015-02-26 MED ORDER — CHOLECALCIFEROL 25 MCG (1000 UT) PO CHEW: 1.0000 | CHEWABLE_TABLET | Freq: Every day | ORAL | Status: DC

## 2015-02-26 NOTE — Telephone Encounter (Signed)
Patient notified of results and agrees to start OTC vitamin D 1000IU daily

## 2015-03-05 ENCOUNTER — Other Ambulatory Visit: Payer: Self-pay | Admitting: Family Medicine

## 2015-03-06 DIAGNOSIS — R0902 Hypoxemia: Secondary | ICD-10-CM | POA: Diagnosis not present

## 2015-03-06 DIAGNOSIS — G4733 Obstructive sleep apnea (adult) (pediatric): Secondary | ICD-10-CM | POA: Diagnosis not present

## 2015-03-06 DIAGNOSIS — I509 Heart failure, unspecified: Secondary | ICD-10-CM | POA: Diagnosis not present

## 2015-03-06 DIAGNOSIS — G4736 Sleep related hypoventilation in conditions classified elsewhere: Secondary | ICD-10-CM | POA: Diagnosis not present

## 2015-03-10 ENCOUNTER — Other Ambulatory Visit: Payer: Self-pay | Admitting: Family Medicine

## 2015-03-10 ENCOUNTER — Other Ambulatory Visit: Payer: Self-pay | Admitting: Pharmacist

## 2015-03-10 ENCOUNTER — Other Ambulatory Visit: Payer: Self-pay | Admitting: Family

## 2015-03-10 ENCOUNTER — Other Ambulatory Visit: Payer: Self-pay | Admitting: Nurse Practitioner

## 2015-03-11 DIAGNOSIS — J449 Chronic obstructive pulmonary disease, unspecified: Secondary | ICD-10-CM | POA: Diagnosis not present

## 2015-03-12 NOTE — Telephone Encounter (Signed)
Refilled x1 month as patient has establish care visit with Dr. Livia Snellen 03/22/15.

## 2015-03-12 NOTE — Telephone Encounter (Signed)
Refilled x1 month as patient has appointment with Dr. Livia Snellen 03/22/15.

## 2015-03-14 DIAGNOSIS — N950 Postmenopausal bleeding: Secondary | ICD-10-CM | POA: Diagnosis not present

## 2015-03-16 ENCOUNTER — Other Ambulatory Visit: Payer: Self-pay | Admitting: *Deleted

## 2015-03-16 DIAGNOSIS — E1149 Type 2 diabetes mellitus with other diabetic neurological complication: Secondary | ICD-10-CM

## 2015-03-16 DIAGNOSIS — E785 Hyperlipidemia, unspecified: Secondary | ICD-10-CM

## 2015-03-16 DIAGNOSIS — I10 Essential (primary) hypertension: Secondary | ICD-10-CM

## 2015-03-16 DIAGNOSIS — I5042 Chronic combined systolic (congestive) and diastolic (congestive) heart failure: Secondary | ICD-10-CM

## 2015-03-16 DIAGNOSIS — M810 Age-related osteoporosis without current pathological fracture: Secondary | ICD-10-CM

## 2015-03-20 ENCOUNTER — Other Ambulatory Visit: Payer: Self-pay | Admitting: Family

## 2015-03-20 ENCOUNTER — Other Ambulatory Visit: Payer: Self-pay | Admitting: Family Medicine

## 2015-03-22 ENCOUNTER — Encounter: Payer: Self-pay | Admitting: Family Medicine

## 2015-03-22 ENCOUNTER — Other Ambulatory Visit: Payer: Self-pay | Admitting: Pharmacist

## 2015-03-22 ENCOUNTER — Ambulatory Visit: Payer: Commercial Managed Care - HMO | Admitting: Family Medicine

## 2015-03-22 VITALS — BP 170/85 | HR 69 | Temp 97.1°F | Ht 66.0 in | Wt 289.9 lb

## 2015-03-22 DIAGNOSIS — E785 Hyperlipidemia, unspecified: Secondary | ICD-10-CM

## 2015-03-22 DIAGNOSIS — I10 Essential (primary) hypertension: Secondary | ICD-10-CM

## 2015-03-22 DIAGNOSIS — I4891 Unspecified atrial fibrillation: Secondary | ICD-10-CM | POA: Diagnosis not present

## 2015-03-22 DIAGNOSIS — I482 Chronic atrial fibrillation, unspecified: Secondary | ICD-10-CM

## 2015-03-22 DIAGNOSIS — J302 Other seasonal allergic rhinitis: Secondary | ICD-10-CM

## 2015-03-22 DIAGNOSIS — E1149 Type 2 diabetes mellitus with other diabetic neurological complication: Secondary | ICD-10-CM | POA: Diagnosis not present

## 2015-03-22 DIAGNOSIS — E1140 Type 2 diabetes mellitus with diabetic neuropathy, unspecified: Secondary | ICD-10-CM | POA: Diagnosis not present

## 2015-03-22 DIAGNOSIS — E1165 Type 2 diabetes mellitus with hyperglycemia: Secondary | ICD-10-CM

## 2015-03-22 DIAGNOSIS — H9202 Otalgia, left ear: Secondary | ICD-10-CM

## 2015-03-22 DIAGNOSIS — M810 Age-related osteoporosis without current pathological fracture: Secondary | ICD-10-CM

## 2015-03-22 DIAGNOSIS — E11649 Type 2 diabetes mellitus with hypoglycemia without coma: Secondary | ICD-10-CM

## 2015-03-22 DIAGNOSIS — I5042 Chronic combined systolic (congestive) and diastolic (congestive) heart failure: Secondary | ICD-10-CM

## 2015-03-22 LAB — POCT CBC
Granulocyte percent: 76.6 %G (ref 37–80)
HCT, POC: 32.8 % — AB (ref 37.7–47.9)
Hemoglobin: 9.6 g/dL — AB (ref 12.2–16.2)
LYMPH, POC: 1.2 (ref 0.6–3.4)
MCH: 25.2 pg — AB (ref 27–31.2)
MCHC: 29.2 g/dL — AB (ref 31.8–35.4)
MCV: 86.3 fL (ref 80–97)
MPV: 8 fL (ref 0–99.8)
PLATELET COUNT, POC: 195 10*3/uL (ref 142–424)
POC Granulocyte: 5.4 (ref 2–6.9)
POC LYMPH PERCENT: 17.2 %L (ref 10–50)
RBC: 3.8 M/uL — AB (ref 4.04–5.48)
RDW, POC: 17 %
WBC: 7.1 10*3/uL (ref 4.6–10.2)

## 2015-03-22 LAB — POCT INR: INR: 1.8

## 2015-03-22 LAB — POCT UA - MICROALBUMIN: Microalbumin Ur, POC: 20 mg/L

## 2015-03-22 LAB — POCT GLYCOSYLATED HEMOGLOBIN (HGB A1C): Hemoglobin A1C: 6.7

## 2015-03-22 MED ORDER — FEXOFENADINE HCL 180 MG PO TABS: 180.0000 mg | ORAL_TABLET | Freq: Every day | ORAL | Status: DC

## 2015-03-22 MED ORDER — TORSEMIDE 100 MG PO TABS: 100.0000 mg | ORAL_TABLET | Freq: Every day | ORAL | Status: DC

## 2015-03-22 MED ORDER — INSULIN ASPART 100 UNIT/ML ~~LOC~~ SOLN: SUBCUTANEOUS | Status: DC

## 2015-03-22 NOTE — Patient Instructions (Addendum)
Take as follows: Check your blood sugar morning and evening. Glucose NovoLog  0-150    0 150-200 3 201-250 5 250-300 7 Over 300 10

## 2015-03-22 NOTE — Progress Notes (Signed)
Subjective:  Patient ID: [name], female    DOB: [birth year]  Age: 69 y.o. MRN: [id number]  CC: Coagulation Disorder; Diabetes; and Hypertension   HPI [name] presents for  follow-up of hypertension. Patient has no history of headache or recent cough. Patient also denies symptoms of TIA such as numbness weakness lateralizing. Patient checks  blood pressure at home and has not had any elevated readings recently. Patient denies side effects from his medication. States taking it regularly.     She takes for [facility] a day to keep the swelling down from her heart failure. She is wearing oxygen for her COPD. She has had to move into an independent living facility due to frequent falls in her home. She is having difficulty walking and using a walker ever since her third fall led to a fracture of her left hip. She is still recovering from that surgery. She carries a diagnosis of osteoporosis which tends to lead to more frequent fracture. She's also had a clavicle and shoulder fracture recently.  Patient also  in for follow-up of elevated cholesterol. Doing well without complaints on current medication. Denies side effects of statin including myalgia and arthralgia and nausea. Also in today for liver function testing. Currently no chest pain. Her shortness of breath is stable as long as she wears her oxygen  Follow-up of diabetes. Patient does check blood sugar at home. Readings run between 200 and 300 so she gives herself some extra Levemir and then they drop. Patient denies symptoms such as polyuria, polydipsia, excessive hunger, nausea. Her neuropathy is under good control with current regimen. Several significant hypoglycemic spells noted. They respond to eating extra. Medications as noted below. Taking them regularly without complication/adverse reaction being reported today.   Patient in for follow-up of atrial fibrillation. She denies any recent bouts of chest pain or  palpitations. That is, she did have some chest pain but it is based on her twisting her torso while in bed. It is their only when she turns side to side and stretches at the breast bone indicating a musculoskeletal involvement. It does not occur with any other activities Additionally she is taking anticoagulants. She denies any recent excessive bleeding episodes including epistaxis, bleeding from the gums, vaginal bleeding, rectal bleeding or hematuria area additionally there has been no excessive bruising. History Joe has a past medical history of Anemia; Arthritis; Asthma; Cataract; CHF (congestive heart failure); Diabetes mellitus; Hyperlipidemia; Hypertension; CAD (coronary artery disease); Stroke; Oxygen dependent; Gait instability; GERD (gastroesophageal reflux disease); Obesity; Sleep apnea, obstructive (2008); Atrial fibrillation; and COPD (chronic obstructive pulmonary disease).   She has past surgical history that includes Cataract extraction w/ intraocular lens  implant, bilateral; Appendectomy; Cholecystectomy; Carpal tunnel release; Cervical spine surgery; Cesarean section; Umbilical hernia repair; Knee arthroscopy; Colonoscopy (11/22/2002); Upper gastrointestinal endoscopy (11/22/2002); Colonoscopy (11/26/2005); Femur IM nail (Left, 05/08/2013); Hip surgery (Left); and Eye surgery.   Her family history includes Alzheimer's disease in her father and mother; Breast cancer in her sister; Cancer in her brother, mother, and sister; Colon cancer in her mother; Diabetes in her daughter, mother, and another family member; Heart attack in her mother, sister, and another family member; Heart disease in her brother, mother, and sister; Kidney disease in her mother; Migraines in her sister.She reports that she has never smoked. She has never used smokeless tobacco. She reports that she does not drink alcohol or use illicit drugs.  Current Outpatient Prescriptions on File Prior to Visit  Medication  Sig  Dispense Refill  . acetaminophen (TYLENOL) 325 MG tablet Take 2 tablets (650 mg total) by mouth every 6 (six) hours as needed for pain or fever.    Marland Kitchen alendronate (FOSAMAX) 70 MG tablet Take 1 tablet (70 mg total) by mouth every 7 (seven) days. 4 tablet 2  . Cholecalciferol ([employer] VITAMIN D3) 1000 UNITS CHEW Chew 1 tablet (1,000 Units total) by mouth daily. 30 tablet   . CRESTOR 10 MG tablet TAKE 1 TABLET (10 MG TOTAL) BY MOUTH AT BEDTIME. 30 tablet 0  . diltiazem (CARDIZEM CD) 180 MG 24 hr capsule TAKE ONE CAPSULE BY MOUTH DAILY 30 capsule 4  . fish oil-omega-3 fatty acids 1000 MG capsule Take 2 g by mouth daily.      Marland Kitchen gabapentin (NEURONTIN) 100 MG capsule Take 1 capsule (100 mg total) by mouth 3 (three) times daily. 90 capsule 0  . Glucose Blood (BLOOD GLUCOSE TEST STRIPS) STRP Use to check BG up to tid.  Dx:  250.03 100 each 3  . glucose monitoring kit (FREESTYLE) monitoring kit Can dispense whichever glucometer is covered by patient's insurance.  Use to check BG up to tid.  Dx:  250.03 1 each 0  . HEMATINIC/FOLIC ACID 578-4 MG TABS TAKE 1 TABLET DAILY 30 each 0  . Insulin Pen Needle (PEN NEEDLES) 31G X 6 MM MISC Use twice a day with insulin pen.  Dx:  E11.9 100 each 5  . KLOR-CON M20 20 MEQ tablet TAKE ONE TABLET BY MOUTH EVERY DAY 30 tablet 1  . Lancets MISC     . LEVEMIR FLEXTOUCH 100 UNIT/ML Pen INJECT 43 UNITS INTO THE SKIN 2 (TWO) TIMES DAILY. 30 mL 0  . lisinopril (PRINIVIL,ZESTRIL) 40 MG tablet TAKE 1 TABLET (40 MG TOTAL) BY MOUTH DAILY. 90 tablet 0  . metFORMIN (GLUCOPHAGE) 500 MG tablet TAKE 1 TABLET BY MOUTH TWICE A DAY 60 tablet 1  . metolazone (ZAROXOLYN) 2.5 MG tablet 1 po on Sat and wednesday 30 tablet 1  . warfarin (COUMADIN) 5 MG tablet TAKE 1 TABLET BY MOUTH EVERY EVENING EXCEPT MONDAY. NO DOSE MONDAY 45 tablet 4   No current facility-administered medications on file prior to visit.    ROS Review of Systems  Constitutional: Positive for fatigue. Negative for fever, chills,  diaphoresis, appetite change and unexpected weight change.  HENT: Positive for congestion, ear pain (left-sided) and sinus pressure. Negative for hearing loss, postnasal drip, rhinorrhea, sneezing, sore throat and trouble swallowing.   Eyes: Negative for pain.  Respiratory: Negative for cough and chest tightness.   Cardiovascular: Negative for chest pain and palpitations.       See history of present illness  Gastrointestinal: Negative for nausea, vomiting, abdominal pain, diarrhea and constipation.  Genitourinary: Negative for dysuria, frequency and menstrual problem.  Musculoskeletal: Positive for joint swelling (ankles).  Skin: Negative for rash.  Neurological: Positive for weakness (generalized , nonfocal). Negative for dizziness, numbness and headaches.  Psychiatric/Behavioral: Negative for dysphoric mood and agitation.    Objective:  BP 170/85 mmHg  Pulse 69  Temp(Src) 97.1 F (36.2 C) (Oral)  Ht _0  (1.676 m)  Wt 289 lb 14.4 oz (131.498 kg)  BMI 46.81 kg/m2  BP Readings from Last 3 Encounters:  03/22/15 170/85  10/02/14 132/56  09/15/14 138/62    Wt Readings from Last 3 Encounters:  03/22/15 289 lb 14.4 oz (131.498 kg)  02/21/15 291 lb (131.997 kg)  10/02/14 276 lb 6.4 oz (125.374 kg)  Physical Exam  Constitutional: She is oriented to person, place, and time. She appears well-developed. No distress.  Morbidly obese, accompanied by Gilford Rile for ambulation and wearing nasal cannula for oxygen  HENT:  Head: Normocephalic and atraumatic.  Right Ear: External ear normal.  Left Ear: External ear normal.  Nose: Nose normal.  Mouth/Throat: Oropharynx is clear and moist.  Eyes: Conjunctivae and EOM are normal. Pupils are equal, round, and reactive to light.  Neck: Normal range of motion. Neck supple. No thyromegaly present.  Cardiovascular: Normal rate and regular rhythm.  Exam reveals gallop (S3).   No murmur heard. Pulmonary/Chest: Effort normal and breath sounds  normal. No respiratory distress. She has no wheezes. She has no rales.  Abdominal: Soft. Bowel sounds are normal. She exhibits distension. She exhibits no mass. There is no tenderness. There is no rebound and no guarding.  Musculoskeletal: She exhibits edema (3+ pretibial bilateral) and tenderness (left hip and shoulder).  Lymphadenopathy:    She has no cervical adenopathy.  Neurological: She is alert and oriented to person, place, and time. She has normal reflexes. She displays normal reflexes. No cranial nerve deficit.  Skin: Skin is warm and dry.  Psychiatric: She has a normal mood and affect. Her behavior is normal. Judgment and thought content normal.    Lab Results  Component Value Date   HGBA1C 6.7 03/22/2015   HGBA1C 6.4 09/15/2014   HGBA1C 5.6 12/23/2013      No results found.  Assessment & Plan:   Robi was seen today for coagulation disorder, diabetes and hypertension.  Diagnoses and all orders for this visit:  Other seasonal allergic rhinitis Orders: -     fexofenadine (ALLEGRA) 180 MG tablet; Take 1 tablet (180 mg total) by mouth daily.  Osteoporosis Orders: -     Thyroid Panel With TSH -     POCT CBC -     Cancel: Thyroid Panel With TSH  Chronic atrial fibrillation Orders: -     POCT INR  Essential hypertension Orders: -     POCT CBC -     CMP14+EGFR  Type 2 diabetes mellitus with other diabetic neurological complication Orders: -     POCT glycosylated hemoglobin (Hb A1C) -     POCT UA - Microalbumin -     CMP14+EGFR -     Microalbumin, urine -     insulin aspart (NOVOLOG) 100 UNIT/ML injection; Take as follows: Check your blood sugar morning and evening. If below 150 do not take NovoLog.150-200 use 3 units. 201-250, 5; 251-300, 7; >300, 10  Hyperlipidemia Orders: -     CMP14+EGFR -     NMR, lipoprofile  Otalgia, left Orders: -     fexofenadine (ALLEGRA) 180 MG tablet; Take 1 tablet (180 mg total) by mouth daily.  Chronic combined  systolic and diastolic congestive heart failure Orders: -     torsemide (DEMADEX) 100 MG tablet; Take 1 tablet (100 mg total) by mouth daily.  Uncontrolled diabetes mellitus with hypoglycemia   I have changed Ms. Budde's torsemide. I am also having her start on fexofenadine and insulin aspart. Additionally, I am having her maintain her fish oil-omega-3 fatty acids, alendronate, acetaminophen, Lancets, glucose monitoring kit, BLOOD GLUCOSE TEST STRIPS, gabapentin, metolazone, Pen Needles, metFORMIN, KLOR-CON M20, Cholecalciferol, lisinopril, diltiazem, warfarin, LEVEMIR FLEXTOUCH, CRESTOR, and HEMATINIC/FOLIC ACID.  Meds ordered this encounter  Medications  . fexofenadine (ALLEGRA) 180 MG tablet    Sig: Take 1 tablet (180 mg total) by mouth daily.  Dispense:  30 tablet    Refill:  11  . torsemide (DEMADEX) 100 MG tablet    Sig: Take 1 tablet (100 mg total) by mouth daily.    Dispense:  30 tablet    Refill:  5  . insulin aspart (NOVOLOG) 100 UNIT/ML injection    Sig: Take as follows: Check your blood sugar morning and evening. If below 150 do not take NovoLog.150-200 use 3 units. 201-250, 5; 251-300, 7; >300, 10    Dispense:  3 vial    Refill:  PRN     sliding scale insulin using NovoLog ordered Take as follows: Check your blood sugar morning and evening. Glucose NovoLog  0-150    0 150-200 3 201-250 5 250-300 7 Over 300 10  Continue the Levemir at 43 units twice daily. Do not vary dose according to blood sugar reading   Follow-up: Return in about 1 month (around 04/21/2015) for diabetes, hypertension.  Claretta Fraise, M.D.

## 2015-03-23 LAB — CMP14+EGFR
A/G RATIO: 1.5 (ref 1.1–2.5)
ALBUMIN: 4.1 g/dL (ref 3.5–4.8)
ALT: 11 IU/L (ref 0–32)
AST: 15 IU/L (ref 0–40)
Alkaline Phosphatase: 136 IU/L — ABNORMAL HIGH (ref 39–117)
BUN/Creatinine Ratio: 23 (ref 11–26)
BUN: 21 mg/dL (ref 8–27)
Bilirubin Total: 0.6 mg/dL (ref 0.0–1.2)
CALCIUM: 9 mg/dL (ref 8.7–10.3)
CO2: 26 mmol/L (ref 18–29)
Chloride: 98 mmol/L (ref 97–108)
Creatinine, Ser: 0.93 mg/dL (ref 0.57–1.00)
GFR calc Af Amer: 72 mL/min/{1.73_m2} (ref >59)
GFR calc non Af Amer: 62 mL/min/{1.73_m2} (ref >59)
Globulin, Total: 2.7 g/dL (ref 1.5–4.5)
Glucose: 101 mg/dL — ABNORMAL HIGH (ref 65–99)
POTASSIUM: 4.2 mmol/L (ref 3.5–5.2)
SODIUM: 140 mmol/L (ref 134–144)
TOTAL PROTEIN: 6.8 g/dL (ref 6.0–8.5)

## 2015-03-23 LAB — NMR, LIPOPROFILE
CHOLESTEROL: 117 mg/dL (ref 100–199)
HDL CHOLESTEROL BY NMR: 45 mg/dL (ref >39)
HDL Particle Number: 27.7 umol/L — ABNORMAL LOW (ref >=30.5)
LDL Particle Number: 516 nmol/L (ref <1000)
LDL Size: 21 nm (ref >20.5)
LDL-C: 45 mg/dL (ref 0–99)
LP-IR Score: 46 — ABNORMAL HIGH (ref <=45)
Small LDL Particle Number: 245 nmol/L (ref <=527)
Triglycerides by NMR: 134 mg/dL (ref 0–149)

## 2015-03-23 LAB — MICROALBUMIN, URINE: MICROALBUM., U, RANDOM: 34.6 ug/mL — AB (ref 0.0–17.0)

## 2015-03-23 LAB — THYROID PANEL WITH TSH
Free Thyroxine Index: 1.8 (ref 1.2–4.9)
T3 UPTAKE RATIO: 27 % (ref 24–39)
T4, Total: 6.8 ug/dL (ref 4.5–12.0)
TSH: 2.96 u[IU]/mL (ref 0.450–4.500)

## 2015-04-05 DIAGNOSIS — G4733 Obstructive sleep apnea (adult) (pediatric): Secondary | ICD-10-CM | POA: Diagnosis not present

## 2015-04-05 DIAGNOSIS — G4736 Sleep related hypoventilation in conditions classified elsewhere: Secondary | ICD-10-CM | POA: Diagnosis not present

## 2015-04-05 DIAGNOSIS — I509 Heart failure, unspecified: Secondary | ICD-10-CM | POA: Diagnosis not present

## 2015-04-05 DIAGNOSIS — R0902 Hypoxemia: Secondary | ICD-10-CM | POA: Diagnosis not present

## 2015-04-10 DIAGNOSIS — J449 Chronic obstructive pulmonary disease, unspecified: Secondary | ICD-10-CM | POA: Diagnosis not present

## 2015-04-17 ENCOUNTER — Other Ambulatory Visit: Payer: Self-pay | Admitting: Family Medicine

## 2015-04-21 ENCOUNTER — Other Ambulatory Visit: Payer: Self-pay | Admitting: Family

## 2015-04-21 ENCOUNTER — Other Ambulatory Visit: Payer: Self-pay | Admitting: Pharmacist

## 2015-04-26 ENCOUNTER — Encounter: Payer: Self-pay | Admitting: Family Medicine

## 2015-04-26 ENCOUNTER — Ambulatory Visit (INDEPENDENT_AMBULATORY_CARE_PROVIDER_SITE_OTHER): Payer: Commercial Managed Care - HMO | Admitting: Family Medicine

## 2015-04-26 VITALS — BP 134/58 | HR 85 | Temp 98.2°F | Ht 66.0 in | Wt 290.6 lb

## 2015-04-26 DIAGNOSIS — I87303 Chronic venous hypertension (idiopathic) without complications of bilateral lower extremity: Secondary | ICD-10-CM

## 2015-04-26 DIAGNOSIS — E11649 Type 2 diabetes mellitus with hypoglycemia without coma: Secondary | ICD-10-CM | POA: Diagnosis not present

## 2015-04-26 DIAGNOSIS — Z794 Long term (current) use of insulin: Secondary | ICD-10-CM

## 2015-04-26 DIAGNOSIS — E1165 Type 2 diabetes mellitus with hyperglycemia: Secondary | ICD-10-CM | POA: Diagnosis not present

## 2015-04-26 MED ORDER — JOBST RELIEF 30-40MMHG XL MISC: Status: DC

## 2015-04-26 NOTE — Progress Notes (Signed)
Subjective:  Patient ID: [name], female    DOB: 10/26/1946  Age: 69 y.o. MRN: [id number]  CC: Diabetes and Leg Swelling   HPI DEYSY SCHABEL presents for legs continue to swell. She has some support stockings she takes. She brings in her glucose readings and these were reviewed with her. They are attached as a separate document. Sliding scale was reviewed with her. She states she is taking it according to the way that it was written at her last visit. She felt bad when she had a 90 readings during this time. She had weak shaky hungry feelings that woke her up during the night.  History Antasia has a past medical history of Anemia; Arthritis; Asthma; Cataract; CHF (congestive heart failure); Diabetes mellitus; Hyperlipidemia; Hypertension; CAD (coronary artery disease); Stroke; Oxygen dependent; Gait instability; GERD (gastroesophageal reflux disease); Obesity; Sleep apnea, obstructive (2008); Atrial fibrillation; and COPD (chronic obstructive pulmonary disease).   She has past surgical history that includes Cataract extraction w/ intraocular lens  implant, bilateral; Appendectomy; Cholecystectomy; Carpal tunnel release; Cervical spine surgery; Cesarean section; Umbilical hernia repair; Knee arthroscopy; Colonoscopy (11/22/2002); Upper gastrointestinal endoscopy (11/22/2002); Colonoscopy (11/26/2005); Femur IM nail (Left, 05/08/2013); Hip surgery (Left); and Eye surgery.   Her family history includes Alzheimer's disease in her father and mother; Breast cancer in her sister; Cancer in her brother, mother, and sister; Colon cancer in her mother; Diabetes in her daughter, mother, and another family member; Heart attack in her mother, sister, and another family member; Heart disease in her brother, mother, and sister; Kidney disease in her mother; Migraines in her sister.She reports that she has never smoked. She has never used smokeless tobacco. She reports that she does not drink alcohol or  use illicit drugs.  Outpatient Prescriptions Prior to Visit  Medication Sig Dispense Refill  . acetaminophen (TYLENOL) 325 MG tablet Take 2 tablets (650 mg total) by mouth every 6 (six) hours as needed for pain or fever.    Marland Kitchen alendronate (FOSAMAX) 70 MG tablet Take 1 tablet (70 mg total) by mouth every 7 (seven) days. 4 tablet 2  . Cholecalciferol ([employer]S VITAMIN D3) 1000 UNITS CHEW Chew 1 tablet (1,000 Units total) by mouth daily. 30 tablet   . CRESTOR 10 MG tablet TAKE 1 TABLET (10 MG TOTAL) BY MOUTH AT BEDTIME. 30 tablet 4  . diltiazem (CARDIZEM CD) 180 MG 24 hr capsule TAKE ONE CAPSULE BY MOUTH DAILY 30 capsule 4  . fexofenadine (ALLEGRA) 180 MG tablet Take 1 tablet (180 mg total) by mouth daily. 30 tablet 11  . fish oil-omega-3 fatty acids 1000 MG capsule Take 2 g by mouth daily.      Marland Kitchen gabapentin (NEURONTIN) 100 MG capsule Take 1 capsule (100 mg total) by mouth 3 (three) times daily. 90 capsule 0  . Glucose Blood (BLOOD GLUCOSE TEST STRIPS) STRP Use to check BG up to tid.  Dx:  250.03 100 each 3  . glucose monitoring kit (FREESTYLE) monitoring kit Can dispense whichever glucometer is covered by patient's insurance.  Use to check BG up to tid.  Dx:  250.03 1 each 0  . HEMATINIC/FOLIC ACID 003-7 MG TABS TAKE 1 TABLET DAILY 30 each 0  . insulin aspart (NOVOLOG) 100 UNIT/ML injection Take as follows: Check your blood sugar morning and evening. If below 150 do not take NovoLog.150-200 use 3 units. 201-250, 5; 251-300, 7; >300, 10 3 vial PRN  . Insulin Pen Needle (PEN NEEDLES) 31G X 6 MM MISC Use  twice a day with insulin pen.  Dx:  E11.9 100 each 5  . KLOR-CON M20 20 MEQ tablet TAKE ONE TABLET BY MOUTH EVERY DAY 30 tablet 4  . Lancets MISC     . LEVEMIR FLEXTOUCH 100 UNIT/ML Pen INJECT 43 UNITS INTO THE SKIN 2 (TWO) TIMES DAILY. 30 mL 0  . lisinopril (PRINIVIL,ZESTRIL) 40 MG tablet TAKE 1 TABLET (40 MG TOTAL) BY MOUTH DAILY. 90 tablet 0  . metFORMIN (GLUCOPHAGE) 500 MG tablet TAKE 1 TABLET BY  MOUTH TWICE A DAY 60 tablet 1  . metolazone (ZAROXOLYN) 2.5 MG tablet 1 po on Sat and wednesday 30 tablet 1  . torsemide (DEMADEX) 100 MG tablet Take 1 tablet (100 mg total) by mouth daily. 30 tablet 5  . torsemide (DEMADEX) 20 MG tablet TAKE 2 TABLETS (40 MG TOTAL) BY MOUTH 2 (TWO) TIMES DAILY. 120 tablet 2  . warfarin (COUMADIN) 5 MG tablet TAKE 1 TABLET BY MOUTH EVERY EVENING EXCEPT MONDAY. NO DOSE MONDAY 45 tablet 4   No facility-administered medications prior to visit.    ROS Review of Systems  Constitutional: Negative for fever, chills, diaphoresis, appetite change, fatigue and unexpected weight change.  HENT: Negative for congestion, ear pain, hearing loss, postnasal drip, rhinorrhea, sneezing, sore throat and trouble swallowing.   Eyes: Negative for pain.  Respiratory: Negative for cough, chest tightness and shortness of breath.   Cardiovascular: Negative for chest pain and palpitations.  Gastrointestinal: Negative for nausea, vomiting, abdominal pain, diarrhea and constipation.  Genitourinary: Negative for dysuria, frequency and menstrual problem.  Musculoskeletal: Negative for joint swelling and arthralgias.  Skin: Negative for rash.  Neurological: Negative for dizziness, weakness, numbness and headaches.  Psychiatric/Behavioral: Negative for dysphoric mood and agitation.    Objective:  BP 134/58 mmHg  Pulse 85  Temp(Src) 98.2 F (36.8 C) (Oral)  Ht _0  (1.676 m)  Wt 290 lb 9.6 oz (131.815 kg)  BMI 46.93 kg/m2  BP Readings from Last 3 Encounters:  04/26/15 134/58  03/22/15 170/85  10/02/14 132/56    Wt Readings from Last 3 Encounters:  04/26/15 290 lb 9.6 oz (131.815 kg)  03/22/15 289 lb 14.4 oz (131.498 kg)  02/21/15 291 lb (131.997 kg)     Physical Exam  Constitutional: She is oriented to person, place, and time. She appears well-developed and well-nourished. No distress.  HENT:  Head: Normocephalic and atraumatic.  Right Ear: External ear normal.    Left Ear: External ear normal.  Nose: Nose normal.  Mouth/Throat: Oropharynx is clear and moist.  Eyes: Conjunctivae and EOM are normal. Pupils are equal, round, and reactive to light.  Neck: Normal range of motion. Neck supple. No thyromegaly present.  Cardiovascular: Normal rate, regular rhythm and normal heart sounds.   No murmur heard. Pulmonary/Chest: Effort normal and breath sounds normal. No respiratory distress. She has no wheezes. She has no rales.  Abdominal: Soft. Bowel sounds are normal. She exhibits no distension. There is no tenderness.  Lymphadenopathy:    She has no cervical adenopathy.  Neurological: She is alert and oriented to person, place, and time. She has normal reflexes.  Skin: Skin is warm and dry.  Psychiatric: She has a normal mood and affect. Her behavior is normal. Judgment and thought content normal.    Lab Results  Component Value Date   HGBA1C 6.7 03/22/2015   HGBA1C 6.4 09/15/2014   HGBA1C 5.6 12/23/2013    Lab Results  Component Value Date   WBC 7.1 03/22/2015  HGB 9.6* 03/22/2015   HCT 32.8* 03/22/2015   PLT 193 10/19/2014   GLUCOSE 101* 03/22/2015   CHOL 117 03/22/2015   TRIG 134 03/22/2015   HDL 45 03/22/2015   LDLCALC 46 12/23/2013   ALT 11 03/22/2015   AST 15 03/22/2015   NA 140 03/22/2015   K 4.2 03/22/2015   CL 98 03/22/2015   CREATININE 0.93 03/22/2015   BUN 21 03/22/2015   CO2 26 03/22/2015   TSH 2.960 03/22/2015   INR 1.8 03/22/2015   HGBA1C 6.7 03/22/2015    No results found.  Assessment & Plan:   Othell was seen today for diabetes and leg swelling.  Diagnoses and all orders for this visit:  Long term current use of insulin  Uncontrolled diabetes mellitus with hypoglycemia  Stasis edema of both lower extremities Orders: -     Elastic Bandages & Supports (JOBST RELIEF 30-40MMHG XL) MISC; Wear daily for swelling in legs  I am having Ms. Cowles start on JOBST RELIEF 30-40MMHG XL. I am also having her  maintain her fish oil-omega-3 fatty acids, alendronate, acetaminophen, Lancets, glucose monitoring kit, BLOOD GLUCOSE TEST STRIPS, gabapentin, metolazone, Pen Needles, metFORMIN, Cholecalciferol, lisinopril, diltiazem, warfarin, LEVEMIR FLEXTOUCH, HEMATINIC/FOLIC ACID, fexofenadine, torsemide, insulin aspart, KLOR-CON M20, CRESTOR, and torsemide.  Meds ordered this encounter  Medications  . Elastic Bandages & Supports (JOBST RELIEF 30-40MMHG XL) MISC    Sig: Wear daily for swelling in legs    Dispense:  2 each    Refill:  11   Updatedsliding scale insulin using NovoLog ordered Take as follows: Check your blood sugar morning and evening. GlucoseNovoLog 0-120  0 120-160 3 [phone] Over 250  10  Follow-up: Return in about 1 month (around 05/27/2015) for diabetes.  Claretta Fraise, M.D.

## 2015-04-26 NOTE — Patient Instructions (Signed)
    sliding scale insulin using NovoLog ordered Take as follows: Check your blood sugar morning and evening. GlucoseNovoLog 0-120  0 120-160 3 [phone] Over 250  10

## 2015-05-06 DIAGNOSIS — G4736 Sleep related hypoventilation in conditions classified elsewhere: Secondary | ICD-10-CM | POA: Diagnosis not present

## 2015-05-06 DIAGNOSIS — R0902 Hypoxemia: Secondary | ICD-10-CM | POA: Diagnosis not present

## 2015-05-06 DIAGNOSIS — I509 Heart failure, unspecified: Secondary | ICD-10-CM | POA: Diagnosis not present

## 2015-05-06 DIAGNOSIS — G4733 Obstructive sleep apnea (adult) (pediatric): Secondary | ICD-10-CM | POA: Diagnosis not present

## 2015-05-09 ENCOUNTER — Ambulatory Visit (INDEPENDENT_AMBULATORY_CARE_PROVIDER_SITE_OTHER): Payer: Commercial Managed Care - HMO | Admitting: Cardiology

## 2015-05-09 ENCOUNTER — Encounter: Payer: Self-pay | Admitting: Cardiology

## 2015-05-09 VITALS — BP 153/77 | HR 72 | Ht 66.0 in | Wt 299.0 lb

## 2015-05-09 DIAGNOSIS — I482 Chronic atrial fibrillation, unspecified: Secondary | ICD-10-CM

## 2015-05-09 NOTE — Progress Notes (Signed)
HPI The patient presents for evaluation of nonobstructive coronary disease and diastolic heart failure and atrial fibrillation.  Since I last saw her she gets around with walker following a femur fracture. She has chronic oxygen use and CPAP at night. She doesn't have any acute cardiac complaints. She doesn't really notice any palpitations. She's had no presyncope or syncope. Her edema is stable. She's able to wear compression stockings. She watches her salt. She doesn't do much exercising however her weight is up.   Allergies  Allergen Reactions  . Codeine   . Lisinopril     R/t to kidney    Current Outpatient Prescriptions  Medication Sig Dispense Refill  . fexofenadine (ALLEGRA) 180 MG tablet Take 1 tablet (180 mg total) by mouth daily. 30 tablet 11  . fish oil-omega-3 fatty acids 1000 MG capsule Take 2 g by mouth daily.      Marland Kitchen gabapentin (NEURONTIN) 100 MG capsule Take 1 capsule (100 mg total) by mouth 3 (three) times daily. 90 capsule 0  . HEMATINIC/FOLIC ACID 952-8 MG TABS TAKE 1 TABLET DAILY 30 each 0  . insulin aspart (NOVOLOG) 100 UNIT/ML injection Take as follows: Check your blood sugar morning and evening. If below 150 do not take NovoLog.150-200 use 3 units. 201-250, 5; 251-300, 7; >300, 10 3 vial PRN  . KLOR-CON M20 20 MEQ tablet TAKE ONE TABLET BY MOUTH EVERY DAY 30 tablet 4  . LEVEMIR FLEXTOUCH 100 UNIT/ML Pen INJECT 43 UNITS INTO THE SKIN 2 (TWO) TIMES DAILY. 30 mL 0  . lisinopril (PRINIVIL,ZESTRIL) 40 MG tablet TAKE 1 TABLET (40 MG TOTAL) BY MOUTH DAILY. 90 tablet 0  . metFORMIN (GLUCOPHAGE) 500 MG tablet TAKE 1 TABLET BY MOUTH TWICE A DAY 60 tablet 1  . metolazone (ZAROXOLYN) 2.5 MG tablet 1 po on Sat and wednesday 30 tablet 1  . torsemide (DEMADEX) 100 MG tablet Take 1 tablet (100 mg total) by mouth daily. 30 tablet 5  . warfarin (COUMADIN) 5 MG tablet TAKE 1 TABLET BY MOUTH EVERY EVENING EXCEPT MONDAY. NO DOSE MONDAY 45 tablet 4  . acetaminophen (TYLENOL) 325 MG  tablet Take 2 tablets (650 mg total) by mouth every 6 (six) hours as needed for pain or fever.    Marland Kitchen alendronate (FOSAMAX) 70 MG tablet Take 1 tablet (70 mg total) by mouth every 7 (seven) days. 4 tablet 2  . Cholecalciferol ([employer] VITAMIN D3) 1000 UNITS CHEW Chew 1 tablet (1,000 Units total) by mouth daily. 30 tablet   . CRESTOR 10 MG tablet TAKE 1 TABLET (10 MG TOTAL) BY MOUTH AT BEDTIME. 30 tablet 4  . diltiazem (CARDIZEM CD) 180 MG 24 hr capsule TAKE ONE CAPSULE BY MOUTH DAILY 30 capsule 4  . Elastic Bandages & Supports (JOBST RELIEF 30-40MMHG XL) MISC Wear daily for swelling in legs 2 each 11  . Glucose Blood (BLOOD GLUCOSE TEST STRIPS) STRP Use to check BG up to tid.  Dx:  250.03 100 each 3  . glucose monitoring kit (FREESTYLE) monitoring kit Can dispense whichever glucometer is covered by patient's insurance.  Use to check BG up to tid.  Dx:  250.03 1 each 0  . Insulin Pen Needle (PEN NEEDLES) 31G X 6 MM MISC Use twice a day with insulin pen.  Dx:  E11.9 100 each 5  . Lancets MISC      No current facility-administered medications for this visit.    Past Medical History  Diagnosis Date  . Anemia   . Arthritis   .  Asthma   . Cataract     had surgery  . CHF (congestive heart failure)   . Diabetes mellitus   . Hyperlipidemia   . Hypertension   . CAD (coronary artery disease)     LAD 60% proximal stenosis mid and distal 60% stenosis 2009.  . Stroke     2 mini  . Oxygen dependent     2 liter per nasal cannula  . Gait instability     uses cane  . GERD (gastroesophageal reflux disease)   . Obesity   . Sleep apnea, obstructive 2008  . Atrial fibrillation   . COPD (chronic obstructive pulmonary disease)     Past Surgical History  Procedure Laterality Date  . Cataract extraction w/ intraocular lens  implant, bilateral    . Appendectomy    . Cholecystectomy    . Carpal tunnel release      right hand  . Cervical spine surgery      fusion  . Cesarean section    . Umbilical  hernia repair    . Knee arthroscopy      twice, left  . Colonoscopy  11/22/2002    normal - Dr.Rrourk  . Upper gastrointestinal endoscopy  11/22/2002    Normal - Dr. Gala Romney  . Colonoscopy  11/26/2005    incomplete with negative BE (Dr. Rowe Pavy, [place])  . Femur im nail Left 05/08/2013    Procedure: INTRAMEDULLARY (IM) NAIL FEMORAL--LONG(BIOMET SYSTEM) and Zimmer Cables;  Surgeon: Augustin Schooling, MD;  Location: [facility];  Service: Orthopedics;  Laterality: Left;  . Hip surgery Left   . Eye surgery      ROS:   As stated in the HPI and negative for all other systems.  PHYSICAL EXAM BP 153/77 mmHg  Pulse 72  Ht 5' 6" (1.676 m)  Wt 299 lb (135.626 kg)  BMI 48.28 kg/m2 GEN:  No distress NECK:  No jugular venous distention at 45 degrees, waveform within normal limits, carotid upstroke brisk and symmetric, no bruits, no thyromegaly LUNGS:  Clear to auscultation bilaterally BACK:  No CVA tenderness CHEST:  Unremarkable HEART:  S1 and S2 within normal limits, no S3, no S4, no clicks, no rubs, 2/6 apical systolic murmur at the apex early peaking, no diastolic murmurs ABD:  Positive bowel sounds normal in frequency in pitch, no bruits, no rebound, no guarding, no midline mass or bruit. EXT:  2 plus pulses throughout, mild edema, no cyanosis no clubbing   EKG:  Atrial fibrillation, rate 72, axis within normal limits, intervals within normal limits, no acute ST-T wave changes.  05/09/2015   ASSESSMENT AND PLAN   ATRIAL FIBRILLATION:  She tolerates anticoagulation.  She had good rate control on Holter in the past.  No change in therapy or further studies are indicated.    CAD:  She has had no new symptoms since her last stress test in 2013.  No further work up is indicated.   DIASTOLIC HF:  She seems to have no left sided failure.  No change in therapy is indicated.   EDEMA:  We discussed her increasing weight.  However, the edema is improved an no change in therapy is indicated.

## 2015-05-09 NOTE — Patient Instructions (Signed)
Medication Instructions:  Your physician recommends that you continue on your current medications as directed. Please refer to the Current Medication list given to you today.  Follow-Up: Follow up in 1 year with Dr. Hochrein.  You will receive a letter in the mail 2 months before you are due.  Please call us when you receive this letter to schedule your follow up appointment.   Thank you for choosing [facility]!!       

## 2015-05-10 ENCOUNTER — Ambulatory Visit (INDEPENDENT_AMBULATORY_CARE_PROVIDER_SITE_OTHER): Payer: Commercial Managed Care - HMO | Admitting: Pharmacist

## 2015-05-10 DIAGNOSIS — I482 Chronic atrial fibrillation, unspecified: Secondary | ICD-10-CM

## 2015-05-10 DIAGNOSIS — I4891 Unspecified atrial fibrillation: Secondary | ICD-10-CM

## 2015-05-10 LAB — POCT INR: INR: 2.8

## 2015-05-10 NOTE — Patient Instructions (Signed)
Anticoagulation Dose Instructions as of 05/10/2015      [name] Tue Wed Thu Fri Sat   New Dose 2.5 mg 5 mg 5 mg 5 mg 2.5 mg 5 mg 5 mg    Description              INR was 2.8 today

## 2015-05-11 DIAGNOSIS — J449 Chronic obstructive pulmonary disease, unspecified: Secondary | ICD-10-CM | POA: Diagnosis not present

## 2015-05-24 ENCOUNTER — Ambulatory Visit (INDEPENDENT_AMBULATORY_CARE_PROVIDER_SITE_OTHER): Payer: Commercial Managed Care - HMO | Admitting: Family Medicine

## 2015-05-24 ENCOUNTER — Encounter: Payer: Self-pay | Admitting: Family Medicine

## 2015-05-24 VITALS — BP 143/59 | HR 71 | Temp 97.2°F | Ht 66.0 in | Wt 295.2 lb

## 2015-05-24 DIAGNOSIS — I4891 Unspecified atrial fibrillation: Secondary | ICD-10-CM

## 2015-05-24 DIAGNOSIS — E1149 Type 2 diabetes mellitus with other diabetic neurological complication: Secondary | ICD-10-CM

## 2015-05-24 DIAGNOSIS — Z794 Long term (current) use of insulin: Secondary | ICD-10-CM | POA: Diagnosis not present

## 2015-05-24 DIAGNOSIS — E785 Hyperlipidemia, unspecified: Secondary | ICD-10-CM | POA: Diagnosis not present

## 2015-05-24 LAB — POCT INR: INR: 2.8

## 2015-05-24 NOTE — Progress Notes (Signed)
Subjective:  Patient ID: [name], female    DOB: 16-Jun-1946  Age: 69 y.o. MRN: [id number]  CC: Diabetes and Coagulation Disorder   HPI FAIZAH KANDLER presents for  follow-up of hypertension. Patient has no history of headache chest pain or shortness of breath or recent cough. Patient also denies symptoms of TIA such as numbness weakness lateralizing. Patient checks  blood pressure at home and has not had any elevated readings recently. Patient denies side effects from his medication. States taking it regularly.  Patient also  in for follow-up of elevated cholesterol. Doing well without complaints on current medication. Denies side effects of statin including myalgia and arthralgia and nausea. Also in today for liver function testing. Currently no chest pain, shortness of breath or other cardiovascular related symptoms noted.  Follow-up of diabetes. Patient does check blood sugar at home. Readings run between 100 and 150 Patient denies symptoms such as polyuria, polydipsia, excessive hunger, nausea No significant hypoglycemic spells noted. Neuropathy continues to cause numbness and tingling in the feet. Medications as noted below. Taking them regularly without complication/adverse reaction being reported today.    Patient in for follow-up of atrial fibrillation. Patient denies any recent bouts of chest pain or palpitations. Additionally, patient is taking anticoagulants. Patient denies any recent excessive bleeding episodes including epistaxis, bleeding from the gums, genitalia, rectal bleeding or hematuria. Additionally there has been no excessive bruising.   History Shakiara has a past medical history of Anemia; Arthritis; Asthma; Cataract; CHF (congestive heart failure); Diabetes mellitus; Hyperlipidemia; Hypertension; CAD (coronary artery disease); Stroke; Oxygen dependent; Gait instability; GERD (gastroesophageal reflux disease); Obesity; Sleep apnea, obstructive (2008); Atrial  fibrillation; and COPD (chronic obstructive pulmonary disease).   She has past surgical history that includes Cataract extraction w/ intraocular lens  implant, bilateral; Appendectomy; Cholecystectomy; Carpal tunnel release; Cervical spine surgery; Cesarean section; Umbilical hernia repair; Knee arthroscopy; Colonoscopy (11/22/2002); Upper gastrointestinal endoscopy (11/22/2002); Colonoscopy (11/26/2005); Femur IM nail (Left, 05/08/2013); Hip surgery (Left); and Eye surgery.   Her family history includes Alzheimer's disease in her father and mother; Breast cancer in her sister; Cancer in her brother, mother, and sister; Colon cancer in her mother; Diabetes in her daughter, mother, and another family member; Heart attack in her mother, sister, and another family member; Heart disease in her brother, mother, and sister; Kidney disease in her mother; Migraines in her sister.She reports that she has never smoked. She has never used smokeless tobacco. She reports that she does not drink alcohol or use illicit drugs.  Current Outpatient Prescriptions on File Prior to Visit  Medication Sig Dispense Refill  . acetaminophen (TYLENOL) 325 MG tablet Take 2 tablets (650 mg total) by mouth every 6 (six) hours as needed for pain or fever.    Marland Kitchen alendronate (FOSAMAX) 70 MG tablet Take 1 tablet (70 mg total) by mouth every 7 (seven) days. 4 tablet 2  . Cholecalciferol ([employer]S VITAMIN D3) 1000 UNITS CHEW Chew 1 tablet (1,000 Units total) by mouth daily. 30 tablet   . CRESTOR 10 MG tablet TAKE 1 TABLET (10 MG TOTAL) BY MOUTH AT BEDTIME. 30 tablet 4  . diltiazem (CARDIZEM CD) 180 MG 24 hr capsule TAKE ONE CAPSULE BY MOUTH DAILY 30 capsule 4  . Elastic Bandages & Supports (JOBST RELIEF 30-40MMHG XL) MISC Wear daily for swelling in legs 2 each 11  . fexofenadine (ALLEGRA) 180 MG tablet Take 1 tablet (180 mg total) by mouth daily. 30 tablet 11  . fish oil-omega-3 fatty acids  1000 MG capsule Take 2 g by mouth daily.      Marland Kitchen  gabapentin (NEURONTIN) 100 MG capsule Take 1 capsule (100 mg total) by mouth 3 (three) times daily. 90 capsule 0  . Glucose Blood (BLOOD GLUCOSE TEST STRIPS) STRP Use to check BG up to tid.  Dx:  250.03 100 each 3  . glucose monitoring kit (FREESTYLE) monitoring kit Can dispense whichever glucometer is covered by patient's insurance.  Use to check BG up to tid.  Dx:  250.03 1 each 0  . HEMATINIC/FOLIC ACID 536-4 MG TABS TAKE 1 TABLET DAILY 30 each 0  . insulin aspart (NOVOLOG) 100 UNIT/ML injection Take as follows: Check your blood sugar morning and evening. If below 150 do not take NovoLog.150-200 use 3 units. 201-250, 5; 251-300, 7; >300, 10 3 vial PRN  . Insulin Pen Needle (PEN NEEDLES) 31G X 6 MM MISC Use twice a day with insulin pen.  Dx:  E11.9 100 each 5  . KLOR-CON M20 20 MEQ tablet TAKE ONE TABLET BY MOUTH EVERY DAY 30 tablet 4  . Lancets MISC     . LEVEMIR FLEXTOUCH 100 UNIT/ML Pen INJECT 43 UNITS INTO THE SKIN 2 (TWO) TIMES DAILY. 30 mL 0  . lisinopril (PRINIVIL,ZESTRIL) 40 MG tablet TAKE 1 TABLET (40 MG TOTAL) BY MOUTH DAILY. 90 tablet 0  . metFORMIN (GLUCOPHAGE) 500 MG tablet TAKE 1 TABLET BY MOUTH TWICE A DAY 60 tablet 1  . metolazone (ZAROXOLYN) 2.5 MG tablet 1 po on Sat and wednesday 30 tablet 1  . torsemide (DEMADEX) 100 MG tablet Take 1 tablet (100 mg total) by mouth daily. 30 tablet 5  . warfarin (COUMADIN) 5 MG tablet TAKE 1 TABLET BY MOUTH EVERY EVENING EXCEPT MONDAY. NO DOSE MONDAY 45 tablet 4   No current facility-administered medications on file prior to visit.    ROS Review of Systems  Constitutional: Negative for fever, chills, diaphoresis, appetite change, fatigue and unexpected weight change.  HENT: Negative for congestion, ear pain, hearing loss, postnasal drip, rhinorrhea, sneezing, sore throat and trouble swallowing.   Eyes: Negative for pain.  Respiratory: Negative for cough, chest tightness and shortness of breath.   Cardiovascular: Negative for chest pain  and palpitations.  Gastrointestinal: Negative for nausea, vomiting, abdominal pain, diarrhea and constipation.  Genitourinary: Negative for dysuria, frequency and menstrual problem.  Musculoskeletal: Negative for joint swelling and arthralgias.  Skin: Negative for rash.  Neurological: Negative for dizziness, weakness, numbness and headaches.  Psychiatric/Behavioral: Negative for dysphoric mood and agitation.    Objective:  BP 143/59 mmHg  Pulse 71  Temp(Src) 97.2 F (36.2 C) (Oral)  Ht 5' 6" (1.676 m)  Wt 295 lb 3.2 oz (133.902 kg)  BMI 47.67 kg/m2  BP Readings from Last 3 Encounters:  05/24/15 143/59  05/09/15 153/77  04/26/15 134/58    Wt Readings from Last 3 Encounters:  05/24/15 295 lb 3.2 oz (133.902 kg)  05/09/15 299 lb (135.626 kg)  04/26/15 290 lb 9.6 oz (131.815 kg)     Physical Exam  Constitutional: She is oriented to person, place, and time. She appears well-developed and well-nourished. No distress.  HENT:  Head: Normocephalic and atraumatic.  Right Ear: External ear normal.  Left Ear: External ear normal.  Nose: Nose normal.  Mouth/Throat: Oropharynx is clear and moist.  Eyes: Conjunctivae and EOM are normal. Pupils are equal, round, and reactive to light.  Neck: Normal range of motion. Neck supple. No thyromegaly present.  Cardiovascular: Normal rate, regular  rhythm and normal heart sounds.   No murmur heard. Pulmonary/Chest: Effort normal and breath sounds normal. No respiratory distress. She has no wheezes. She has no rales.  Abdominal: Soft. Bowel sounds are normal. She exhibits no distension. There is no tenderness.  Lymphadenopathy:    She has no cervical adenopathy.  Neurological: She is alert and oriented to person, place, and time. She has normal reflexes.  Skin: Skin is warm and dry.  Psychiatric: She has a normal mood and affect. Her behavior is normal. Judgment and thought content normal.    Lab Results  Component Value Date   HGBA1C 6.7  03/22/2015   HGBA1C 6.4 09/15/2014   HGBA1C 5.6 12/23/2013    Lab Results  Component Value Date   WBC 7.1 03/22/2015   HGB 9.6* 03/22/2015   HCT 32.8* 03/22/2015   PLT 193 10/19/2014   GLUCOSE 101* 03/22/2015   CHOL 117 03/22/2015   TRIG 134 03/22/2015   HDL 45 03/22/2015   LDLCALC 46 12/23/2013   ALT 11 03/22/2015   AST 15 03/22/2015   NA 140 03/22/2015   K 4.2 03/22/2015   CL 98 03/22/2015   CREATININE 0.93 03/22/2015   BUN 21 03/22/2015   CO2 26 03/22/2015   TSH 2.960 03/22/2015   INR 2.8 05/24/2015   HGBA1C 6.7 03/22/2015    No results found.  Assessment & Plan:   Juel was seen today for diabetes and coagulation disorder.  Diagnoses and all orders for this visit:  Atrial fibrillation, unspecified Orders: -     POCT INR  Long term current use of insulin  Type 2 diabetes mellitus with other diabetic neurological complication  Hyperlipidemia   I am having Ms. Blanchard maintain her fish oil-omega-3 fatty acids, alendronate, acetaminophen, Lancets, glucose monitoring kit, BLOOD GLUCOSE TEST STRIPS, gabapentin, metolazone, Pen Needles, metFORMIN, Cholecalciferol, lisinopril, diltiazem, warfarin, LEVEMIR FLEXTOUCH, HEMATINIC/FOLIC ACID, fexofenadine, torsemide, insulin aspart, KLOR-CON M20, CRESTOR, and JOBST RELIEF 30-40MMHG XL.  No orders of the defined types were placed in this encounter.     Follow-up: Return in about 1 month (around 06/23/2015).  Claretta Fraise, M.D.

## 2015-05-27 DIAGNOSIS — I482 Chronic atrial fibrillation, unspecified: Secondary | ICD-10-CM | POA: Insufficient documentation

## 2015-05-27 DIAGNOSIS — I4891 Unspecified atrial fibrillation: Secondary | ICD-10-CM | POA: Insufficient documentation

## 2015-05-29 ENCOUNTER — Other Ambulatory Visit: Payer: Self-pay | Admitting: Nurse Practitioner

## 2015-06-07 ENCOUNTER — Telehealth: Payer: Self-pay | Admitting: *Deleted

## 2015-06-07 NOTE — Telephone Encounter (Signed)
NA or VM after several rings.  Will continue to attempt to contact pt.

## 2015-06-07 NOTE — Telephone Encounter (Signed)
-----   Message from Minus Breeding, MD sent at 06/07/2015  1:07 PM EDT ----- Please call her.  Her BP fluctuates and there is no further change in therapy that I would suggest.  Thanks.

## 2015-06-08 NOTE — Telephone Encounter (Signed)
NA and no VM - will continue to attempt to contact pt.

## 2015-06-12 NOTE — Telephone Encounter (Signed)
Pt aware Dr Percival Spanish in aware of her BP fluctuations and he does not want to make any changes in her treatment at this time.  She states understanding.

## 2015-06-17 ENCOUNTER — Other Ambulatory Visit: Payer: Self-pay | Admitting: Family Medicine

## 2015-06-22 ENCOUNTER — Ambulatory Visit (INDEPENDENT_AMBULATORY_CARE_PROVIDER_SITE_OTHER): Payer: Medicare HMO | Admitting: Pharmacist

## 2015-06-22 DIAGNOSIS — I4891 Unspecified atrial fibrillation: Secondary | ICD-10-CM | POA: Diagnosis not present

## 2015-06-22 LAB — POCT INR: INR: 1.7

## 2015-06-22 NOTE — Patient Instructions (Signed)
Anticoagulation Dose Instructions as of 06/22/2015      Dorene Grebe Tue Wed Thu Fri Sat   New Dose 2.5 mg 5 mg 5 mg 5 mg 2.5 mg 5 mg 5 mg    Description        Take 1 and 1/2 tablets today - Friday, July 22nd, then resume usual dose of  dose of warfarin 5mg  1 tablet daily except 1/2 tablet on sundays and thursdays.       INR was 1.7 today

## 2015-06-25 ENCOUNTER — Ambulatory Visit (INDEPENDENT_AMBULATORY_CARE_PROVIDER_SITE_OTHER): Payer: Medicare HMO | Admitting: Family Medicine

## 2015-06-25 ENCOUNTER — Encounter: Payer: Self-pay | Admitting: Family Medicine

## 2015-06-25 VITALS — BP 130/51 | HR 72 | Temp 97.2°F | Ht 66.0 in | Wt 291.2 lb

## 2015-06-25 DIAGNOSIS — E1149 Type 2 diabetes mellitus with other diabetic neurological complication: Secondary | ICD-10-CM | POA: Diagnosis not present

## 2015-06-25 DIAGNOSIS — M810 Age-related osteoporosis without current pathological fracture: Secondary | ICD-10-CM

## 2015-06-25 DIAGNOSIS — E785 Hyperlipidemia, unspecified: Secondary | ICD-10-CM

## 2015-06-25 DIAGNOSIS — I10 Essential (primary) hypertension: Secondary | ICD-10-CM | POA: Diagnosis not present

## 2015-06-25 LAB — POCT CBC
Granulocyte percent: 77.2 %G (ref 37–80)
HEMATOCRIT: 29.4 % — AB (ref 37.7–47.9)
Hemoglobin: 9.2 g/dL — AB (ref 12.2–16.2)
LYMPH, POC: 1.4 (ref 0.6–3.4)
MCH, POC: 26.1 pg — AB (ref 27–31.2)
MCHC: 31.2 g/dL — AB (ref 31.8–35.4)
MCV: 83.4 fL (ref 80–97)
MPV: 7.8 fL (ref 0–99.8)
POC Granulocyte: 6.3 (ref 2–6.9)
POC LYMPH PERCENT: 16.7 %L (ref 10–50)
Platelet Count, POC: 188 10*3/uL (ref 142–424)
RBC: 3.52 M/uL — AB (ref 4.04–5.48)
RDW, POC: 18.3 %
WBC: 8.2 10*3/uL (ref 4.6–10.2)

## 2015-06-25 LAB — POCT GLYCOSYLATED HEMOGLOBIN (HGB A1C): Hemoglobin A1C: 6.8

## 2015-06-25 MED ORDER — INSULIN DETEMIR 100 UNIT/ML FLEXPEN: 50.0000 [IU] | PEN_INJECTOR | Freq: Two times a day (BID) | SUBCUTANEOUS | Status: DC

## 2015-06-25 MED ORDER — METFORMIN HCL 850 MG PO TABS: 850.0000 mg | ORAL_TABLET | Freq: Two times a day (BID) | ORAL | Status: DC

## 2015-06-25 MED ORDER — PEN NEEDLES 31G X 6 MM MISC: Status: DC

## 2015-06-25 NOTE — Progress Notes (Signed)
Subjective:  Patient ID: [name], female    DOB: 1946/07/23  Age: 69 y.o. MRN: [id number]  CC: Diabetes; Hypertension; and Atrial Fibrillation   HPI [name] presents for Follow-up of diabetes. Patient does  check blood sugar at home. Patient returned log sheet showing the glucose fasting range at 115 to 140 most mornings with an occasional bump up as high as 200. Postprandial tends to run 150-225 with most being in the high 100s Patient denies symptoms such as polyuria, polydipsia, excessive hunger, nausea No significant hypoglycemic spells noted. Medications as noted below. Taking them regularly without complication/adverse reaction being reported today.    follow-up of hypertension. Patient has no history of headache chest pain or shortness of breath or recent cough. Patient also denies symptoms of TIA such as numbness weakness lateralizing. Patient checks  blood pressure at home and has not had any elevated readings recently. Patient denies side effects from his medication. States taking it regularly.   History [name] has a past medical history of Anemia; Arthritis; Asthma; Cataract; CHF (congestive heart failure); Diabetes mellitus; Hyperlipidemia; Hypertension; CAD (coronary artery disease); Stroke; Oxygen dependent; Gait instability; GERD (gastroesophageal reflux disease); Obesity; Sleep apnea, obstructive (2008); Atrial fibrillation; and COPD (chronic obstructive pulmonary disease).   She has past surgical history that includes Cataract extraction w/ intraocular lens  implant, bilateral; Appendectomy; Cholecystectomy; Carpal tunnel release; Cervical spine surgery; Cesarean section; Umbilical hernia repair; Knee arthroscopy; Colonoscopy (11/22/2002); Upper gastrointestinal endoscopy (11/22/2002); Colonoscopy (11/26/2005); Femur IM nail (Left, 05/08/2013); Hip surgery (Left); and Eye surgery.   Her family history includes Alzheimer's disease in her father and mother;  Breast cancer in her sister; Cancer in her brother, mother, and sister; Colon cancer in her mother; Diabetes in her daughter, mother, and another family member; Heart attack in her mother, sister, and another family member; Heart disease in her brother, mother, and sister; Kidney disease in her mother; Migraines in her sister.She reports that she has never smoked. She has never used smokeless tobacco. She reports that she does not drink alcohol or use illicit drugs.  Outpatient Prescriptions Prior to Visit  Medication Sig Dispense Refill  . acetaminophen (TYLENOL) 325 MG tablet Take 2 tablets (650 mg total) by mouth every 6 (six) hours as needed for pain or fever.    Marland Kitchen alendronate (FOSAMAX) 70 MG tablet Take 1 tablet (70 mg total) by mouth every 7 (seven) days. 4 tablet 2  . Cholecalciferol ([employer]S VITAMIN D3) 1000 UNITS CHEW Chew 1 tablet (1,000 Units total) by mouth daily. 30 tablet   . CRESTOR 10 MG tablet TAKE 1 TABLET (10 MG TOTAL) BY MOUTH AT BEDTIME. 30 tablet 4  . diltiazem (CARDIZEM CD) 180 MG 24 hr capsule TAKE ONE CAPSULE BY MOUTH DAILY 30 capsule 4  . Elastic Bandages & Supports (JOBST RELIEF 30-40MMHG XL) MISC Wear daily for swelling in legs 2 each 11  . fexofenadine (ALLEGRA) 180 MG tablet Take 1 tablet (180 mg total) by mouth daily. 30 tablet 11  . fish oil-omega-3 fatty acids 1000 MG capsule Take 2 g by mouth daily.      Marland Kitchen gabapentin (NEURONTIN) 100 MG capsule Take 1 capsule (100 mg total) by mouth 3 (three) times daily. 90 capsule 0  . Glucose Blood (BLOOD GLUCOSE TEST STRIPS) STRP Use to check BG up to tid.  Dx:  250.03 100 each 3  . glucose monitoring kit (FREESTYLE) monitoring kit Can dispense whichever glucometer is covered by patient's insurance.  Use to  check BG up to tid.  Dx:  250.03 1 each 0  . HEMATINIC/FOLIC ACID 387-5 MG TABS TAKE 1 TABLET DAILY 30 each 0  . insulin aspart (NOVOLOG) 100 UNIT/ML injection Take as follows: Check your blood sugar morning and evening. If below  150 do not take NovoLog.150-200 use 3 units. 201-250, 5; 251-300, 7; >300, 10 3 vial PRN  . KLOR-CON M20 20 MEQ tablet TAKE ONE TABLET BY MOUTH EVERY DAY 30 tablet 4  . Lancets MISC     . lisinopril (PRINIVIL,ZESTRIL) 40 MG tablet TAKE 1 TABLET (40 MG TOTAL) BY MOUTH DAILY. 90 tablet 1  . metolazone (ZAROXOLYN) 2.5 MG tablet 1 po on Sat and wednesday 30 tablet 1  . torsemide (DEMADEX) 100 MG tablet Take 1 tablet (100 mg total) by mouth daily. 30 tablet 5  . warfarin (COUMADIN) 5 MG tablet TAKE 1 TABLET BY MOUTH EVERY EVENING EXCEPT MONDAY. NO DOSE MONDAY 45 tablet 4  . Insulin Pen Needle (PEN NEEDLES) 31G X 6 MM MISC Use twice a day with insulin pen.  Dx:  E11.9 100 each 5  . LEVEMIR FLEXTOUCH 100 UNIT/ML Pen INJECT 43 UNITS INTO THE SKIN 2 (TWO) TIMES DAILY. 30 mL 0  . metFORMIN (GLUCOPHAGE) 500 MG tablet TAKE 1 TABLET BY MOUTH TWICE A DAY 60 tablet 1  . metFORMIN (GLUCOPHAGE) 500 MG tablet TAKE 1 TABLET BY MOUTH TWICE A DAY 60 tablet 2   No facility-administered medications prior to visit.    ROS Review of Systems  Constitutional: Negative for fever, chills, diaphoresis, appetite change, fatigue and unexpected weight change.  HENT: Negative for congestion, ear pain, hearing loss, postnasal drip, rhinorrhea, sneezing, sore throat and trouble swallowing.   Eyes: Negative for pain.  Respiratory: Negative for cough, chest tightness and shortness of breath.   Cardiovascular: Negative for chest pain and palpitations.  Gastrointestinal: Negative for nausea, vomiting, abdominal pain, diarrhea and constipation.  Genitourinary: Negative for dysuria, frequency and menstrual problem.  Musculoskeletal: Negative for joint swelling and arthralgias.  Skin: Negative for rash.  Neurological: Negative for dizziness, weakness, numbness and headaches.  Psychiatric/Behavioral: Negative for dysphoric mood and agitation.    Objective:  BP 130/51 mmHg  Pulse 72  Temp(Src) 97.2 F (36.2 C) (Oral)  Ht 5'  6" (1.676 m)  Wt 291 lb 3.2 oz (132.087 kg)  BMI 47.02 kg/m2  BP Readings from Last 3 Encounters:  06/25/15 130/51  05/24/15 143/59  05/09/15 153/77    Wt Readings from Last 3 Encounters:  06/25/15 291 lb 3.2 oz (132.087 kg)  05/24/15 295 lb 3.2 oz (133.902 kg)  05/09/15 299 lb (135.626 kg)     Physical Exam  Constitutional: She is oriented to person, place, and time. She appears well-developed and well-nourished. No distress.  HENT:  Head: Normocephalic and atraumatic.  Right Ear: External ear normal.  Left Ear: External ear normal.  Nose: Nose normal.  Mouth/Throat: Oropharynx is clear and moist.  Eyes: Conjunctivae and EOM are normal. Pupils are equal, round, and reactive to light.  Neck: Normal range of motion. Neck supple. No thyromegaly present.  Cardiovascular: Normal rate, regular rhythm and normal heart sounds.   No murmur heard. Pulmonary/Chest: Effort normal and breath sounds normal. No respiratory distress. She has no wheezes. She has no rales.  Abdominal: Soft. Bowel sounds are normal. She exhibits no distension. There is no tenderness.  Lymphadenopathy:    She has no cervical adenopathy.  Neurological: She is alert and oriented to person, place,  and time. She has normal reflexes.  Skin: Skin is warm and dry.  Psychiatric: She has a normal mood and affect. Her behavior is normal. Judgment and thought content normal.    Lab Results  Component Value Date   HGBA1C 6.8 06/25/2015   HGBA1C 6.7 03/22/2015   HGBA1C 6.4 09/15/2014    Lab Results  Component Value Date   WBC 8.2 06/25/2015   HGB 9.2* 06/25/2015   HCT 29.4* 06/25/2015   PLT 193 10/19/2014   GLUCOSE 101* 03/22/2015   CHOL 117 03/22/2015   TRIG 134 03/22/2015   HDL 45 03/22/2015   LDLCALC 46 12/23/2013   ALT 11 03/22/2015   AST 15 03/22/2015   NA 140 03/22/2015   K 4.2 03/22/2015   CL 98 03/22/2015   CREATININE 0.93 03/22/2015   BUN 21 03/22/2015   CO2 26 03/22/2015   TSH 2.960  03/22/2015   INR 1.7 06/22/2015   HGBA1C 6.8 06/25/2015    No results found.  Assessment & Plan:   Azarie was seen today for diabetes, hypertension and atrial fibrillation.  Diagnoses and all orders for this visit:  Type 2 diabetes mellitus with other diabetic neurological complication Orders: -     POCT glycosylated hemoglobin (Hb A1C) -     CMP14+EGFR  Osteoporosis Orders: -     POCT CBC  Essential hypertension Orders: -     POCT CBC -     CMP14+EGFR  Hyperlipidemia Orders: -     CMP14+EGFR -     Lipid panel  Morbid obesity  Other orders -     Insulin Pen Needle (PEN NEEDLES) 31G X 6 MM MISC; Use twice a day with insulin pen.  Dx:  E11.9 -     Insulin Detemir (LEVEMIR FLEXTOUCH) 100 UNIT/ML Pen; Inject 50 Units into the skin 2 (two) times daily. -     metFORMIN (GLUCOPHAGE) 850 MG tablet; Take 1 tablet (850 mg total) by mouth 2 (two) times daily.   I have discontinued [name]'s metFORMIN. I have changed her LEVEMIR FLEXTOUCH to Insulin Detemir. I have also changed her metFORMIN. Additionally, I am having her maintain her fish oil-omega-3 fatty acids, alendronate, acetaminophen, Lancets, glucose monitoring kit, BLOOD GLUCOSE TEST STRIPS, gabapentin, metolazone, Cholecalciferol, diltiazem, warfarin, HEMATINIC/FOLIC ACID, fexofenadine, torsemide, insulin aspart, KLOR-CON M20, CRESTOR, JOBST RELIEF 30-40MMHG XL, lisinopril, and Pen Needles.  Meds ordered this encounter  Medications  . Insulin Pen Needle (PEN NEEDLES) 31G X 6 MM MISC    Sig: Use twice a day with insulin pen.  Dx:  E11.9    Dispense:  100 each    Refill:  5  . Insulin Detemir (LEVEMIR FLEXTOUCH) 100 UNIT/ML Pen    Sig: Inject 50 Units into the skin 2 (two) times daily.    Dispense:  30 mL    Refill:  0  . metFORMIN (GLUCOPHAGE) 850 MG tablet    Sig: Take 1 tablet (850 mg total) by mouth 2 (two) times daily.    Dispense:  60 tablet    Refill:  5     Follow-up: Return in about 3 months  (around 09/25/2015) for diabetes, hypertension.  Claretta Fraise, M.D.

## 2015-06-26 LAB — CMP14+EGFR
ALBUMIN: 3.9 g/dL (ref 3.5–4.8)
ALK PHOS: 127 IU/L — AB (ref 39–117)
ALT: 12 IU/L (ref 0–32)
AST: 14 IU/L (ref 0–40)
Albumin/Globulin Ratio: 1.5 (ref 1.1–2.5)
BILIRUBIN TOTAL: 0.4 mg/dL (ref 0.0–1.2)
BUN / CREAT RATIO: 23 (ref 11–26)
BUN: 32 mg/dL — ABNORMAL HIGH (ref 8–27)
CHLORIDE: 104 mmol/L (ref 97–108)
CO2: 25 mmol/L (ref 18–29)
Calcium: 9.1 mg/dL (ref 8.7–10.3)
Creatinine, Ser: 1.4 mg/dL — ABNORMAL HIGH (ref 0.57–1.00)
GFR, EST AFRICAN AMERICAN: 44 mL/min/{1.73_m2} — AB (ref >59)
GFR, EST NON AFRICAN AMERICAN: 38 mL/min/{1.73_m2} — AB (ref >59)
GLOBULIN, TOTAL: 2.6 g/dL (ref 1.5–4.5)
GLUCOSE: 168 mg/dL — AB (ref 65–99)
Potassium: 5.3 mmol/L — ABNORMAL HIGH (ref 3.5–5.2)
Sodium: 144 mmol/L (ref 134–144)
Total Protein: 6.5 g/dL (ref 6.0–8.5)

## 2015-06-26 LAB — LIPID PANEL
CHOLESTEROL TOTAL: 98 mg/dL — AB (ref 100–199)
Chol/HDL Ratio: 2.3 ratio units (ref 0.0–4.4)
HDL: 42 mg/dL (ref >39)
LDL Calculated: 32 mg/dL (ref 0–99)
TRIGLYCERIDES: 118 mg/dL (ref 0–149)
VLDL Cholesterol Cal: 24 mg/dL (ref 5–40)

## 2015-07-04 ENCOUNTER — Telehealth: Payer: Self-pay | Admitting: *Deleted

## 2015-07-04 NOTE — Telephone Encounter (Signed)
-----   Message from Claretta Fraise, MD sent at 06/26/2015  6:43 PM EDT ----- Kidneys are weak but stable. Anemia is stable also. Continue treatments as is.

## 2015-07-04 NOTE — Telephone Encounter (Signed)
Pt notified of results Verbalizes understanding 

## 2015-07-10 ENCOUNTER — Other Ambulatory Visit: Payer: Self-pay | Admitting: Family Medicine

## 2015-07-16 ENCOUNTER — Other Ambulatory Visit: Payer: Self-pay | Admitting: Family

## 2015-07-20 ENCOUNTER — Ambulatory Visit (INDEPENDENT_AMBULATORY_CARE_PROVIDER_SITE_OTHER): Payer: Medicare HMO

## 2015-07-20 ENCOUNTER — Encounter: Payer: Self-pay | Admitting: Family Medicine

## 2015-07-20 ENCOUNTER — Ambulatory Visit (INDEPENDENT_AMBULATORY_CARE_PROVIDER_SITE_OTHER): Payer: Medicare HMO | Admitting: Family Medicine

## 2015-07-20 VITALS — BP 128/65 | HR 76 | Temp 97.5°F | Ht 66.0 in | Wt 289.2 lb

## 2015-07-20 DIAGNOSIS — S32000A Wedge compression fracture of unspecified lumbar vertebra, initial encounter for closed fracture: Secondary | ICD-10-CM

## 2015-07-20 DIAGNOSIS — M5416 Radiculopathy, lumbar region: Secondary | ICD-10-CM | POA: Diagnosis not present

## 2015-07-20 MED ORDER — PREDNISONE 10 MG PO TABS: ORAL_TABLET | ORAL | Status: DC

## 2015-07-20 NOTE — Progress Notes (Signed)
Subjective:  Patient ID: [name], female    DOB: [birth year]  Age: 69 y.o. MRN: [id number]  CC: Back Pain   HPI [name] presents for pain onset 2 days ago. Points to midline L5 region at L5-S1. Pain is 8/10. Sudden onset. NKI. No radiation.No relief with position. Not able to perform usual routine due to pain History Nour has a past medical history of Anemia; Arthritis; Asthma; Cataract; CHF (congestive heart failure); Diabetes mellitus; Hyperlipidemia; Hypertension; CAD (coronary artery disease); Stroke; Oxygen dependent; Gait instability; GERD (gastroesophageal reflux disease); Obesity; Sleep apnea, obstructive (2008); Atrial fibrillation; and COPD (chronic obstructive pulmonary disease).   She has past surgical history that includes Cataract extraction w/ intraocular lens  implant, bilateral; Appendectomy; Cholecystectomy; Carpal tunnel release; Cervical spine surgery; Cesarean section; Umbilical hernia repair; Knee arthroscopy; Colonoscopy (11/22/2002); Upper gastrointestinal endoscopy (11/22/2002); Colonoscopy (11/26/2005); Femur IM nail (Left, 05/08/2013); Hip surgery (Left); and Eye surgery.   Her family history includes Alzheimer's disease in her father and mother; Breast cancer in her sister; Cancer in her brother, mother, and sister; Colon cancer in her mother; Diabetes in her daughter, mother, and another family member; Heart attack in her mother, sister, and another family member; Heart disease in her brother, mother, and sister; Kidney disease in her mother; Migraines in her sister.She reports that she has never smoked. She has never used smokeless tobacco. She reports that she does not drink alcohol or use illicit drugs.  Outpatient Prescriptions Prior to Visit  Medication Sig Dispense Refill  . acetaminophen (TYLENOL) 325 MG tablet Take 2 tablets (650 mg total) by mouth every 6 (six) hours as needed for pain or fever.    Marland Kitchen alendronate (FOSAMAX) 70 MG tablet Take  1 tablet (70 mg total) by mouth every 7 (seven) days. 4 tablet 2  . Cholecalciferol ([employer]S VITAMIN D3) 1000 UNITS CHEW Chew 1 tablet (1,000 Units total) by mouth daily. 30 tablet   . CRESTOR 10 MG tablet TAKE 1 TABLET (10 MG TOTAL) BY MOUTH AT BEDTIME. 30 tablet 4  . diltiazem (CARDIZEM CD) 180 MG 24 hr capsule TAKE ONE CAPSULE BY MOUTH DAILY 30 capsule 4  . Elastic Bandages & Supports (JOBST RELIEF 30-40MMHG XL) MISC Wear daily for swelling in legs 2 each 11  . fexofenadine (ALLEGRA) 180 MG tablet Take 1 tablet (180 mg total) by mouth daily. 30 tablet 11  . fish oil-omega-3 fatty acids 1000 MG capsule Take 2 g by mouth daily.      Marland Kitchen gabapentin (NEURONTIN) 100 MG capsule Take 1 capsule (100 mg total) by mouth 3 (three) times daily. 90 capsule 0  . Glucose Blood (BLOOD GLUCOSE TEST STRIPS) STRP Use to check BG up to tid.  Dx:  250.03 100 each 3  . glucose monitoring kit (FREESTYLE) monitoring kit Can dispense whichever glucometer is covered by patient's insurance.  Use to check BG up to tid.  Dx:  250.03 1 each 0  . HEMATINIC/FOLIC ACID 233-0 MG TABS TAKE 1 TABLET DAILY 30 each 0  . insulin aspart (NOVOLOG) 100 UNIT/ML injection Take as follows: Check your blood sugar morning and evening. If below 150 do not take NovoLog.150-200 use 3 units. 201-250, 5; 251-300, 7; >300, 10 3 vial PRN  . Insulin Detemir (LEVEMIR FLEXTOUCH) 100 UNIT/ML Pen Inject 50 Units into the skin 2 (two) times daily. 30 mL 0  . Insulin Pen Needle (PEN NEEDLES) 31G X 6 MM MISC Use twice a day with insulin pen.  Dx:  E11.9 100 each 5  . KLOR-CON M20 20 MEQ tablet TAKE ONE TABLET BY MOUTH EVERY DAY 30 tablet 4  . Lancets MISC     . lisinopril (PRINIVIL,ZESTRIL) 40 MG tablet TAKE 1 TABLET (40 MG TOTAL) BY MOUTH DAILY. 90 tablet 1  . metFORMIN (GLUCOPHAGE) 850 MG tablet Take 1 tablet (850 mg total) by mouth 2 (two) times daily. 60 tablet 5  . metolazone (ZAROXOLYN) 2.5 MG tablet 1 po on Sat and wednesday 30 tablet 1  . torsemide  (DEMADEX) 100 MG tablet Take 1 tablet (100 mg total) by mouth daily. 30 tablet 5  . torsemide (DEMADEX) 20 MG tablet TAKE 2 TABLETS (40 MG TOTAL) BY MOUTH 2 (TWO) TIMES DAILY. 120 tablet 2  . warfarin (COUMADIN) 5 MG tablet TAKE 1 TABLET BY MOUTH EVERY EVENING EXCEPT MONDAY. NO DOSE MONDAY 45 tablet 4  . warfarin (COUMADIN) 5 MG tablet TAKE 1 TABLET BY MOUTH EVERY EVENING EXCEPT MONDAY. NO DOSE MONDAY 45 tablet 2   No facility-administered medications prior to visit.    ROS Review of Systems  Constitutional: Negative for fever, chills, diaphoresis, appetite change, fatigue and unexpected weight change.  HENT: Negative for congestion, ear pain, hearing loss, postnasal drip, rhinorrhea, sneezing, sore throat and trouble swallowing.   Eyes: Negative for pain.  Respiratory: Negative for cough, chest tightness and shortness of breath.   Cardiovascular: Negative for chest pain and palpitations.  Gastrointestinal: Negative for nausea, vomiting, abdominal pain, diarrhea and constipation.  Genitourinary: Negative for dysuria, frequency and menstrual problem.  Musculoskeletal: Positive for back pain and arthralgias. Negative for joint swelling.  Skin: Negative for rash.  Neurological: Negative for dizziness, weakness, numbness and headaches.  Psychiatric/Behavioral: Negative for dysphoric mood and agitation.    Objective:  BP 128/65 mmHg  Pulse 76  Temp(Src) 97.5 F (36.4 C) (Oral)  Ht 5' 6" (1.676 m)  Wt 289 lb 3.2 oz (131.18 kg)  BMI 46.70 kg/m2  BP Readings from Last 3 Encounters:  07/20/15 128/65  06/25/15 130/51  05/24/15 143/59    Wt Readings from Last 3 Encounters:  07/20/15 289 lb 3.2 oz (131.18 kg)  06/25/15 291 lb 3.2 oz (132.087 kg)  05/24/15 295 lb 3.2 oz (133.902 kg)     Physical Exam  Constitutional: She is oriented to person, place, and time. She appears well-developed and well-nourished. No distress.  HENT:  Head: Normocephalic and atraumatic.  Right Ear:  External ear normal.  Left Ear: External ear normal.  Nose: Nose normal.  Mouth/Throat: Oropharynx is clear and moist.  Eyes: Conjunctivae and EOM are normal. Pupils are equal, round, and reactive to light.  Neck: Normal range of motion. Neck supple. No thyromegaly present.  Cardiovascular: Normal rate, regular rhythm and normal heart sounds.   No murmur heard. Pulmonary/Chest: Effort normal and breath sounds normal. No respiratory distress. She has no wheezes. She has no rales.  Abdominal: Soft. Bowel sounds are normal. She exhibits no distension. There is no tenderness.  Musculoskeletal: She exhibits tenderness.  Lymphadenopathy:    She has no cervical adenopathy.  Neurological: She is alert and oriented to person, place, and time. She has normal reflexes.  Skin: Skin is warm and dry.  Psychiatric: She has a normal mood and affect. Her behavior is normal. Judgment and thought content normal.    Lab Results  Component Value Date   HGBA1C 6.8 06/25/2015   HGBA1C 6.7 03/22/2015   HGBA1C 6.4 09/15/2014    Lab Results  Component Value Date  WBC 8.2 06/25/2015   HGB 9.2* 06/25/2015   HCT 29.4* 06/25/2015   PLT 193 10/19/2014   GLUCOSE 168* 06/25/2015   CHOL 98* 06/25/2015   TRIG 118 06/25/2015   HDL 42 06/25/2015   LDLCALC 32 06/25/2015   ALT 12 06/25/2015   AST 14 06/25/2015   NA 144 06/25/2015   K 5.3* 06/25/2015   CL 104 06/25/2015   CREATININE 1.40* 06/25/2015   BUN 32* 06/25/2015   CO2 25 06/25/2015   TSH 2.960 03/22/2015   INR 1.7 06/22/2015   HGBA1C 6.8 06/25/2015    No results found.  Assessment & Plan:   [name] was seen today for back pain.  Diagnoses and all orders for this visit:  Lumbar radiculopathy -     DG Lumbar Spine 2-3 Views; Future -     MR Lumbar Spine Wo Contrast; Future  Lumbar compression fracture, closed, initial encounter -     MR Lumbar Spine Wo Contrast; Future -     IR VERTEBROPLASTY LUMOBSACRAL INJ; Future  Other orders -      predniSONE (DELTASONE) 10 MG tablet; Take 5 daily for 3 days followed by 4,3,2 and 1 for 3 days each.   I am having [name] start on predniSONE. I am also having her maintain her fish oil-omega-3 fatty acids, alendronate, acetaminophen, Lancets, glucose monitoring kit, BLOOD GLUCOSE TEST STRIPS, gabapentin, metolazone, Cholecalciferol, diltiazem, warfarin, HEMATINIC/FOLIC ACID, fexofenadine, torsemide, insulin aspart, KLOR-CON M20, CRESTOR, JOBST RELIEF 30-40MMHG XL, lisinopril, Pen Needles, Insulin Detemir, metFORMIN, warfarin, and torsemide.  Meds ordered this encounter  Medications  . predniSONE (DELTASONE) 10 MG tablet    Sig: Take 5 daily for 3 days followed by 4,3,2 and 1 for 3 days each.    Dispense:  45 tablet    Refill:  0   XR- compression L2  Follow-up: Return if symptoms worsen or fail to improve.  Claretta Fraise, M.D.

## 2015-07-23 ENCOUNTER — Telehealth: Payer: Self-pay | Admitting: Family

## 2015-07-23 NOTE — Telephone Encounter (Signed)
Done 06/25/15, [facility]

## 2015-07-25 ENCOUNTER — Ambulatory Visit (INDEPENDENT_AMBULATORY_CARE_PROVIDER_SITE_OTHER): Payer: Medicare HMO | Admitting: Pharmacist

## 2015-07-25 DIAGNOSIS — I4891 Unspecified atrial fibrillation: Secondary | ICD-10-CM

## 2015-07-25 LAB — POCT INR: INR: 1.9

## 2015-08-01 ENCOUNTER — Other Ambulatory Visit: Payer: Self-pay | Admitting: Pharmacist

## 2015-08-08 ENCOUNTER — Telehealth: Payer: Self-pay | Admitting: Family Medicine

## 2015-08-09 ENCOUNTER — Encounter: Payer: Self-pay | Admitting: Family

## 2015-08-09 ENCOUNTER — Ambulatory Visit (INDEPENDENT_AMBULATORY_CARE_PROVIDER_SITE_OTHER): Payer: Medicare HMO | Admitting: Pharmacist

## 2015-08-09 ENCOUNTER — Ambulatory Visit (INDEPENDENT_AMBULATORY_CARE_PROVIDER_SITE_OTHER): Payer: Medicare HMO | Admitting: Family

## 2015-08-09 ENCOUNTER — Encounter: Payer: Self-pay | Admitting: Pharmacist

## 2015-08-09 ENCOUNTER — Telehealth: Payer: Self-pay

## 2015-08-09 VITALS — BP 88/56 | HR 60 | Temp 98.1°F

## 2015-08-09 VITALS — BP 100/50 | HR 69

## 2015-08-09 DIAGNOSIS — I959 Hypotension, unspecified: Secondary | ICD-10-CM | POA: Diagnosis not present

## 2015-08-09 DIAGNOSIS — M109 Gout, unspecified: Secondary | ICD-10-CM | POA: Diagnosis not present

## 2015-08-09 DIAGNOSIS — I4891 Unspecified atrial fibrillation: Secondary | ICD-10-CM

## 2015-08-09 LAB — POCT INR: INR: 2.3

## 2015-08-09 MED ORDER — COLCHICINE 0.6 MG PO TABS: ORAL_TABLET | ORAL | Status: DC

## 2015-08-09 MED ORDER — LISINOPRIL 5 MG PO TABS: 5.0000 mg | ORAL_TABLET | Freq: Every day | ORAL | Status: DC

## 2015-08-09 MED ORDER — LISINOPRIL 20 MG PO TABS: ORAL_TABLET | ORAL | Status: DC

## 2015-08-09 MED ORDER — FEBUXOSTAT 40 MG PO TABS: 40.0000 mg | ORAL_TABLET | Freq: Every day | ORAL | Status: DC

## 2015-08-09 MED ORDER — ALLOPURINOL 300 MG PO TABS: 300.0000 mg | ORAL_TABLET | Freq: Every day | ORAL | Status: DC

## 2015-08-09 NOTE — Telephone Encounter (Signed)
So sorry, thanks for the heads up! WS

## 2015-08-09 NOTE — Telephone Encounter (Signed)
Patient calling that she is still waiting on her Back surgery referral.  Checked on this and there is no referral in for this but there is an order for vertebralplasty.  FYI   In referrals we do not see orders on our workkqueue  It has to be put in as a neurosurgeon referral for vertebralplasty.  Thank you      I put it in

## 2015-08-09 NOTE — Progress Notes (Signed)
   Subjective:    Patient ID: [name], female    DOB: 12-Aug-1946, 68 y.o.   MRN: [id number]  HPI PT presents to the office today for left foot pain. PT states the pain started yesterday. Pt states she has a history gout and this pain is similar pain. Pt states she is having to walk with a cane.    Review of Systems  Constitutional: Negative.   HENT: Negative.   Eyes: Negative.   Respiratory: Negative.  Negative for shortness of breath.   Cardiovascular: Negative.  Negative for palpitations.  Gastrointestinal: Negative.   Endocrine: Negative.   Genitourinary: Negative.   Musculoskeletal: Negative.   Neurological: Negative.  Negative for headaches.  Hematological: Negative.   Psychiatric/Behavioral: Negative.   All other systems reviewed and are negative.      Objective:   Physical Exam  Constitutional: She is oriented to person, place, and time. She appears well-developed and well-nourished. No distress.  HENT:  Head: Normocephalic and atraumatic.  Eyes: Pupils are equal, round, and reactive to light.  Neck: Normal range of motion. Neck supple. No thyromegaly present.  Cardiovascular: Normal rate, regular rhythm, normal heart sounds and intact distal pulses.   No murmur heard. Pulmonary/Chest: Effort normal and breath sounds normal. No respiratory distress. She has no wheezes.  Abdominal: Soft. Bowel sounds are normal. She exhibits no distension. There is no tenderness.  Musculoskeletal: Normal range of motion. She exhibits edema (3+ in BLE). She exhibits no tenderness.  Left foot tenderness with any touch, warmth present, pinky toe with great amount of pain   Neurological: She is alert and oriented to person, place, and time. She has normal reflexes. No cranial nerve deficit.  Skin: Skin is warm and dry.  Psychiatric: She has a normal mood and affect. Her behavior is normal. Judgment and thought content normal.  Vitals reviewed.  BP 88/56 mmHg  Pulse 60   Temp(Src) 98.1 F (36.7 C) (Oral)  Wt         Assessment & Plan:  1. Hypotension, unspecified hypotension type -Lisinipril to 5 mg daily from 40 mg -Pt has follow up with Dr. Livia Snellen 08/27/15 - BMP8+EGFR  2. Gout of left foot, unspecified cause, unspecified chronicity -Low purine diet discussed\ -Keep follow up appt with Dr. Quinn Axe on 08/27/15 - BMP8+EGFR - colchicine 0.6 MG tablet; 1.2 mg then 0.6 mg one hour for pain  Dispense: 30 tablet; Refill: 1 - Uric acid - febuxostat (ULORIC) 40 MG tablet; Take 1 tablet (40 mg total) by mouth daily.  Dispense: 90 tablet; Refill: [place], FNP

## 2015-08-09 NOTE — Patient Instructions (Signed)
Anticoagulation Dose Instructions as of 08/09/2015      Dorene Grebe Tue Wed Thu Fri Sat   New Dose 2.5 mg 5 mg 5 mg 5 mg 2.5 mg 5 mg 5 mg    Description        Continue current warfarin dose of 5mg  1/2 sundays and thursdays and 1 tablet all other days.      INR was 2.3 today

## 2015-08-09 NOTE — Patient Instructions (Signed)

## 2015-08-09 NOTE — Progress Notes (Signed)
Patient c/o pain in left foot similar to several years ago when she had gout - triaged to see Evelina Dun, NP

## 2015-08-10 LAB — BMP8+EGFR
BUN / CREAT RATIO: 31 — AB (ref 11–26)
BUN: 67 mg/dL — AB (ref 8–27)
CO2: 24 mmol/L (ref 18–29)
CREATININE: 2.16 mg/dL — AB (ref 0.57–1.00)
Calcium: 9 mg/dL (ref 8.7–10.3)
Chloride: 94 mmol/L — ABNORMAL LOW (ref 97–108)
GFR, EST AFRICAN AMERICAN: 26 mL/min/{1.73_m2} — AB (ref >59)
GFR, EST NON AFRICAN AMERICAN: 22 mL/min/{1.73_m2} — AB (ref >59)
Glucose: 145 mg/dL — ABNORMAL HIGH (ref 65–99)
Potassium: 5.9 mmol/L (ref 3.5–5.2)
SODIUM: 138 mmol/L (ref 134–144)

## 2015-08-10 LAB — URIC ACID: Uric Acid: 11.5 mg/dL — ABNORMAL HIGH (ref 2.5–7.1)

## 2015-08-15 ENCOUNTER — Ambulatory Visit (INDEPENDENT_AMBULATORY_CARE_PROVIDER_SITE_OTHER): Payer: Medicare HMO | Admitting: Family Medicine

## 2015-08-15 ENCOUNTER — Encounter: Payer: Self-pay | Admitting: Family Medicine

## 2015-08-15 VITALS — BP 120/63 | HR 66 | Temp 98.4°F | Ht 66.0 in | Wt 284.4 lb

## 2015-08-15 DIAGNOSIS — N183 Chronic kidney disease, stage 3 unspecified: Secondary | ICD-10-CM

## 2015-08-15 DIAGNOSIS — I509 Heart failure, unspecified: Secondary | ICD-10-CM

## 2015-08-15 DIAGNOSIS — E119 Type 2 diabetes mellitus without complications: Secondary | ICD-10-CM

## 2015-08-15 DIAGNOSIS — Z794 Long term (current) use of insulin: Secondary | ICD-10-CM | POA: Diagnosis not present

## 2015-08-15 DIAGNOSIS — J449 Chronic obstructive pulmonary disease, unspecified: Secondary | ICD-10-CM

## 2015-08-15 DIAGNOSIS — E1121 Type 2 diabetes mellitus with diabetic nephropathy: Secondary | ICD-10-CM

## 2015-08-15 DIAGNOSIS — I482 Chronic atrial fibrillation, unspecified: Secondary | ICD-10-CM

## 2015-08-15 DIAGNOSIS — N189 Chronic kidney disease, unspecified: Secondary | ICD-10-CM | POA: Insufficient documentation

## 2015-08-15 DIAGNOSIS — I959 Hypotension, unspecified: Secondary | ICD-10-CM

## 2015-08-15 MED ORDER — INSULIN ASPART 100 UNIT/ML ~~LOC~~ SOLN: SUBCUTANEOUS | Status: DC

## 2015-08-15 MED ORDER — LISINOPRIL 20 MG PO TABS: 20.0000 mg | ORAL_TABLET | Freq: Every day | ORAL | Status: DC

## 2015-08-15 NOTE — Patient Instructions (Signed)
Check glucose before each meal. Adjust the dose of novolog as follows.  Glucose  Novolog dose 0-100   o 101-150  3 151-200  5 201-250  7 251-300  10 301-350  13 Over 350  15  PLEASE NOTE: DO NOT CHANGE YOUR DOSE OF LEVEMIR BASED ON YOUR GLUCOSE READING. THAT IS ONLY DONE AT THE TIME OF AN OFFICE VISIT.

## 2015-08-15 NOTE — Progress Notes (Signed)
Subjective:  Patient ID: [name], female    DOB: 25-Nov-1946  Age: 69 y.o. MRN: [id number]  CC: Hospitalization Follow-up   HPI [name] presents for recheck of her kidney function after recent hospitalization. She was discharged from the hospital 3 days ago after taking a prednisone Dosepak that apparently caused her creatinine to climb from 1.4 to 2.7. Those hospital records were reviewed in detail. They are appended. The patient was also using high dosages of diuretic as well as potassium retaining agents such as lisinopril. She was also noted to have evidence for congestive heart failure on x-ray and was carefully hydrated to restore the kidneys. She was removed from all of her diuretic. She has started now to become somewhat short of breath although it's mild and she does use nasal cannula oxygen chronically. Today she also brings in her glucose logs showing readings ranging from 100-400. There is a reading of 75 at which time she noted weak shaky nausea/hypoglycemic symptoms earlier in the month of August. She states that she is not currently using the NovoLog but when she was she was just giving herself about 3 units when the sugar was very high. Her Levemir was recently adjusted to 50 units twice daily. She was maintained on a sliding scale in hospital. No sliding scale NovoLog was recommended at time of discharge. Her ACE inhibitor was decreased to 5 mg from 40 mg at diagnosis. She was instructed to resume 40 mg daily at discharge. At discharge her creatinine was near baseline. Her EGFR was 36. It had been 22 at admission. Pt. Feels much better with hospital treatment, but is concerned her CHF is beginning to flare. Patient in for follow-up of atrial fibrillation. Patient denies any recent bouts of chest pain or palpitations. Additionally, patient is taking anticoagulants. Patient denies any recent excessive bleeding episodes including epistaxis, bleeding from the gums,  genitalia, rectal bleeding or hematuria. Additionally there has been no excessive bruising.  History [name] has a past medical history of Anemia; Arthritis; Asthma; Cataract; CHF (congestive heart failure); Diabetes mellitus; Hyperlipidemia; Hypertension; CAD (coronary artery disease); Stroke; Oxygen dependent; Gait instability; GERD (gastroesophageal reflux disease); Obesity; Sleep apnea, obstructive (2008); Atrial fibrillation; and COPD (chronic obstructive pulmonary disease).   She has past surgical history that includes Cataract extraction w/ intraocular lens  implant, bilateral; Appendectomy; Cholecystectomy; Carpal tunnel release; Cervical spine surgery; Cesarean section; Umbilical hernia repair; Knee arthroscopy; Colonoscopy (11/22/2002); Upper gastrointestinal endoscopy (11/22/2002); Colonoscopy (11/26/2005); Femur IM nail (Left, 05/08/2013); Hip surgery (Left); and Eye surgery.   Her family history includes Alzheimer's disease in her father and mother; Breast cancer in her sister; Cancer in her brother, mother, and sister; Colon cancer in her mother; Diabetes in her daughter, mother, and another family member; Heart attack in her mother, sister, and another family member; Heart disease in her brother, mother, and sister; Kidney disease in her mother; Migraines in her sister.She reports that she has never smoked. She has never used smokeless tobacco. She reports that she does not drink alcohol or use illicit drugs.  Outpatient Prescriptions Prior to Visit  Medication Sig Dispense Refill  . acetaminophen (TYLENOL) 325 MG tablet Take 2 tablets (650 mg total) by mouth every 6 (six) hours as needed for pain or fever.    Marland Kitchen alendronate (FOSAMAX) 70 MG tablet Take 1 tablet (70 mg total) by mouth every 7 (seven) days. 4 tablet 2  . Cholecalciferol ([employer] VITAMIN D3) 1000 UNITS CHEW Chew 1 tablet (1,000 Units  total) by mouth daily. 30 tablet   . colchicine 0.6 MG tablet 1.2 mg then 0.6 mg one hour for pain  30 tablet 1  . CRESTOR 10 MG tablet TAKE 1 TABLET (10 MG TOTAL) BY MOUTH AT BEDTIME. 30 tablet 4  . diltiazem (CARDIZEM CD) 180 MG 24 hr capsule TAKE ONE CAPSULE BY MOUTH DAILY 30 capsule 4  . Elastic Bandages & Supports (JOBST RELIEF 30-40MMHG XL) MISC Wear daily for swelling in legs 2 each 11  . febuxostat (ULORIC) 40 MG tablet Take 1 tablet (40 mg total) by mouth daily. 90 tablet 1  . fexofenadine (ALLEGRA) 180 MG tablet Take 1 tablet (180 mg total) by mouth daily. 30 tablet 11  . fish oil-omega-3 fatty acids 1000 MG capsule Take 2 g by mouth daily.      Marland Kitchen gabapentin (NEURONTIN) 100 MG capsule Take 1 capsule (100 mg total) by mouth 3 (three) times daily. 90 capsule 0  . HEMATINIC/FOLIC ACID 712-1 MG TABS TAKE 1 TABLET DAILY 30 each 0  . Insulin Detemir (LEVEMIR FLEXTOUCH) 100 UNIT/ML Pen Inject 50 Units into the skin 2 (two) times daily. 30 mL 0  . Lancets MISC     . metFORMIN (GLUCOPHAGE) 850 MG tablet Take 1 tablet (850 mg total) by mouth 2 (two) times daily. 60 tablet 5  . metolazone (ZAROXOLYN) 2.5 MG tablet 1 po on Sat and wednesday 30 tablet 1  . torsemide (DEMADEX) 20 MG tablet TAKE 2 TABLETS (40 MG TOTAL) BY MOUTH 2 (TWO) TIMES DAILY. 120 tablet 2  . warfarin (COUMADIN) 5 MG tablet TAKE 1 TABLET BY MOUTH EVERY EVENING EXCEPT MONDAY. NO DOSE MONDAY 45 tablet 2  . Glucose Blood (BLOOD GLUCOSE TEST STRIPS) STRP Use to check BG up to tid.  Dx:  250.03 100 each 3  . glucose monitoring kit (FREESTYLE) monitoring kit Can dispense whichever glucometer is covered by patient's insurance.  Use to check BG up to tid.  Dx:  250.03 1 each 0  . insulin aspart (NOVOLOG) 100 UNIT/ML injection Take as follows: Check your blood sugar morning and evening. If below 150 do not take NovoLog.150-200 use 3 units. 201-250, 5; 251-300, 7; >300, 10 3 vial PRN  . Insulin Pen Needle (PEN NEEDLES) 31G X 6 MM MISC Use twice a day with insulin pen.  Dx:  E11.9 100 each 5  . KLOR-CON M20 20 MEQ tablet TAKE ONE TABLET  BY MOUTH EVERY DAY 30 tablet 4  . lisinopril (PRINIVIL,ZESTRIL) 5 MG tablet Take 1 tablet (5 mg total) by mouth daily. 90 tablet 3  . predniSONE (DELTASONE) 10 MG tablet Take 5 daily for 3 days followed by 4,3,2 and 1 for 3 days each. 45 tablet 0  . torsemide (DEMADEX) 100 MG tablet Take 1 tablet (100 mg total) by mouth daily. (Patient not taking: Reported on 08/15/2015) 30 tablet 5   No facility-administered medications prior to visit.    ROS Review of Systems  Constitutional: Positive for activity change (decreassed) and fatigue. Negative for fever, chills, diaphoresis, appetite change and unexpected weight change.  HENT: Negative for congestion, ear pain, hearing loss, postnasal drip, rhinorrhea, sneezing, sore throat and trouble swallowing.   Eyes: Negative for pain and visual disturbance.  Respiratory: Positive for shortness of breath. Negative for cough and chest tightness.   Cardiovascular: Positive for leg swelling. Negative for chest pain and palpitations.  Gastrointestinal: Negative for nausea, vomiting, abdominal pain, diarrhea and constipation.  Genitourinary: Negative for dysuria, frequency and menstrual  problem.  Musculoskeletal: Negative for joint swelling and arthralgias.  Skin: Negative for rash.  Neurological: Positive for weakness. Negative for dizziness, numbness and headaches.  Psychiatric/Behavioral: Negative for dysphoric mood and agitation.    Objective:  BP 120/63 mmHg  Pulse 66  Temp(Src) 98.4 F (36.9 C) (Oral)  Ht 5' 6"  (1.676 m)  Wt 284 lb 6.4 oz (129.003 kg)  BMI 45.93 kg/m2  SpO2 95%  BP Readings from Last 3 Encounters:  08/15/15 120/63  08/09/15 88/56  08/09/15 100/50    Wt Readings from Last 3 Encounters:  08/15/15 284 lb 6.4 oz (129.003 kg)  07/20/15 289 lb 3.2 oz (131.18 kg)  06/25/15 291 lb 3.2 oz (132.087 kg)     Physical Exam  Constitutional: She is oriented to person, place, and time. She appears well-developed and well-nourished.  No distress.  HENT:  Head: Normocephalic and atraumatic.  Right Ear: External ear normal.  Left Ear: External ear normal.  Nose: Nose normal.  Mouth/Throat: Oropharynx is clear and moist.  Eyes: Conjunctivae and EOM are normal. Pupils are equal, round, and reactive to light.  Neck: Normal range of motion. Neck supple. No JVD present. No thyromegaly present.  Cardiovascular: Normal rate, regular rhythm and normal heart sounds.   No murmur heard. Pulmonary/Chest: Effort normal. No stridor. No respiratory distress. She has wheezes (faint). She has no rales.  Wearing nasal cannula oxygen at time of exam  Abdominal: Soft. Bowel sounds are normal. She exhibits no distension. There is no tenderness.  Musculoskeletal:  Wheelchair-bound  Lymphadenopathy:    She has no cervical adenopathy.  Neurological: She is alert and oriented to person, place, and time. She has normal reflexes.  Skin: Skin is warm and dry.  Psychiatric: She has a normal mood and affect. Her behavior is normal. Judgment and thought content normal.    Lab Results  Component Value Date   HGBA1C 6.8 06/25/2015   HGBA1C 6.7 03/22/2015   HGBA1C 6.4 09/15/2014    Lab Results  Component Value Date   WBC 8.2 06/25/2015   HGB 9.2* 06/25/2015   HCT 29.4* 06/25/2015   PLT 193 10/19/2014   GLUCOSE 145* 08/09/2015   CHOL 98* 06/25/2015   TRIG 118 06/25/2015   HDL 42 06/25/2015   LDLCALC 32 06/25/2015   ALT 12 06/25/2015   AST 14 06/25/2015   NA 138 08/09/2015   K 5.9* 08/09/2015   CL 94* 08/09/2015   CREATININE 2.16* 08/09/2015   BUN 67* 08/09/2015   CO2 24 08/09/2015   TSH 2.960 03/22/2015   INR 2.3 08/09/2015   HGBA1C 6.8 06/25/2015    No results found.  Assessment & Plan:   Olanda was seen today for hospitalization follow-up.  Diagnoses and all orders for this visit:  Chronic atrial fibrillation -     CMP14+EGFR  Acute on chronic congestive heart failure, unspecified congestive heart failure type -      CMP14+EGFR  Chronic obstructive pulmonary disease, unspecified COPD, unspecified chronic bronchitis type -     CMP14+EGFR  Diabetic nephropathy associated with type 2 diabetes mellitus -     CMP14+EGFR  Chronic renal insufficiency, stage 3 (moderate) -     CMP14+EGFR  Long-term current use of insulin for diabetes mellitus -     CMP14+EGFR  Hypotension, unspecified hypotension type -     CMP14+EGFR -     lisinopril (PRINIVIL,ZESTRIL) 20 MG tablet; Take 1 tablet (20 mg total) by mouth daily.  Other orders -  insulin aspart (NOVOLOG) 100 UNIT/ML injection; Use as directed   I have discontinued Ms. Greenwood's glucose monitoring kit, BLOOD GLUCOSE TEST STRIPS, insulin aspart, KLOR-CON M20, Pen Needles, and predniSONE. I have also changed her lisinopril. Additionally, I am having her start on insulin aspart. Lastly, I am having her maintain her fish oil-omega-3 fatty acids, alendronate, acetaminophen, Lancets, gabapentin, metolazone, Cholecalciferol, diltiazem, HEMATINIC/FOLIC ACID, fexofenadine, CRESTOR, JOBST RELIEF 30-40MMHG XL, Insulin Detemir, metFORMIN, warfarin, torsemide, colchicine, and febuxostat.  Meds ordered this encounter  Medications  . lisinopril (PRINIVIL,ZESTRIL) 20 MG tablet    Sig: Take 1 tablet (20 mg total) by mouth daily.    Dispense:  30 tablet    Refill:  2  . insulin aspart (NOVOLOG) 100 UNIT/ML injection    Sig: Use as directed    Dispense:  3 vial    Refill:  PRN   Check glucose before each meal. Adjust the dose of novolog as follows.  Glucose  Novolog dose 0-100   o 101-150  3 151-200  5 201-250  7 251-300  10 301-350  13 Over 350  15  PLEASE NOTE: DO NOT CHANGE YOUR DOSE OF LEVEMIR BASED ON YOUR GLUCOSE READING. THAT IS ONLY DONE AT THE TIME OF AN OFFICE VISIT.  Follow-up: Return in about 1 week (around 08/22/2015) for diabetes, CHF.  Claretta Fraise, M.D.

## 2015-08-16 ENCOUNTER — Telehealth: Payer: Self-pay | Admitting: Family Medicine

## 2015-08-16 NOTE — Telephone Encounter (Signed)
Spoke with daughter to let her know Dr. Livia Snellen was off today and that I would send directly to him

## 2015-08-17 ENCOUNTER — Other Ambulatory Visit: Payer: Self-pay | Admitting: Family Medicine

## 2015-08-22 ENCOUNTER — Ambulatory Visit (INDEPENDENT_AMBULATORY_CARE_PROVIDER_SITE_OTHER): Payer: Medicare HMO

## 2015-08-22 ENCOUNTER — Encounter: Payer: Self-pay | Admitting: Family Medicine

## 2015-08-22 ENCOUNTER — Ambulatory Visit (INDEPENDENT_AMBULATORY_CARE_PROVIDER_SITE_OTHER): Payer: Medicare HMO | Admitting: Family Medicine

## 2015-08-22 VITALS — BP 134/59 | HR 71 | Temp 97.5°F | Ht 66.0 in | Wt 283.8 lb

## 2015-08-22 DIAGNOSIS — J441 Chronic obstructive pulmonary disease with (acute) exacerbation: Secondary | ICD-10-CM

## 2015-08-22 DIAGNOSIS — E1121 Type 2 diabetes mellitus with diabetic nephropathy: Secondary | ICD-10-CM

## 2015-08-22 DIAGNOSIS — I5040 Unspecified combined systolic (congestive) and diastolic (congestive) heart failure: Secondary | ICD-10-CM | POA: Diagnosis not present

## 2015-08-22 DIAGNOSIS — N184 Chronic kidney disease, stage 4 (severe): Secondary | ICD-10-CM | POA: Diagnosis not present

## 2015-08-22 DIAGNOSIS — S32000A Wedge compression fracture of unspecified lumbar vertebra, initial encounter for closed fracture: Secondary | ICD-10-CM

## 2015-08-22 NOTE — Patient Instructions (Signed)
DASH Eating Plan °DASH stands for "Dietary Approaches to Stop Hypertension." The DASH eating plan is a healthy eating plan that has been shown to reduce high blood pressure (hypertension). Additional health benefits may include reducing the risk of type 2 diabetes mellitus, heart disease, and stroke. The DASH eating plan may also help with weight loss. °WHAT DO I NEED TO KNOW ABOUT THE DASH EATING PLAN? °For the DASH eating plan, you will follow these general guidelines: °· Choose foods with a percent daily value for sodium of less than 5% (as listed on the food label). °· Use salt-free seasonings or herbs instead of table salt or sea salt. °· Check with your health care provider or pharmacist before using salt substitutes. °· Eat lower-sodium products, often labeled as "lower sodium" or "no salt added." °· Eat fresh foods. °· Eat more vegetables, fruits, and low-fat dairy products. °· Choose whole grains. Look for the word "whole" as the first word in the ingredient list. °· Choose fish and skinless chicken or turkey more often than red meat. Limit fish, poultry, and meat to 6 oz (170 g) each day. °· Limit sweets, desserts, sugars, and sugary drinks. °· Choose heart-healthy fats. °· Limit cheese to 1 oz (28 g) per day. °· Eat more home-cooked food and less restaurant, buffet, and fast food. °· Limit fried foods. °· Cook foods using methods other than frying. °· Limit canned vegetables. If you do use them, rinse them well to decrease the sodium. °· When eating at a restaurant, ask that your food be prepared with less salt, or no salt if possible. °WHAT FOODS CAN I EAT? °Seek help from a dietitian for individual calorie needs. °Grains °Whole grain or whole wheat bread. Brown rice. Whole grain or whole wheat pasta. Quinoa, bulgur, and whole grain cereals. Low-sodium cereals. Corn or whole wheat flour tortillas. Whole grain cornbread. Whole grain crackers. Low-sodium crackers. °Vegetables °Fresh or frozen vegetables  (raw, steamed, roasted, or grilled). Low-sodium or reduced-sodium tomato and vegetable juices. Low-sodium or reduced-sodium tomato sauce and paste. Low-sodium or reduced-sodium canned vegetables.  °Fruits °All fresh, canned (in natural juice), or frozen fruits. °Meat and Other Protein Products °Ground beef (85% or leaner), grass-fed beef, or beef trimmed of fat. Skinless chicken or turkey. Ground chicken or turkey. Pork trimmed of fat. All fish and seafood. Eggs. Dried beans, peas, or lentils. Unsalted nuts and seeds. Unsalted canned beans. °Dairy °Low-fat dairy products, such as skim or 1% milk, 2% or reduced-fat cheeses, low-fat ricotta or cottage cheese, or plain low-fat yogurt. Low-sodium or reduced-sodium cheeses. °Fats and Oils °Tub margarines without trans fats. Light or reduced-fat mayonnaise and salad dressings (reduced sodium). Avocado. Safflower, olive, or canola oils. Natural peanut or almond butter. °Other °Unsalted popcorn and pretzels. °The items listed above may not be a complete list of recommended foods or beverages. Contact your dietitian for more options. °WHAT FOODS ARE NOT RECOMMENDED? °Grains °White bread. White pasta. White rice. Refined cornbread. Bagels and croissants. Crackers that contain trans fat. °Vegetables °Creamed or fried vegetables. Vegetables in a cheese sauce. Regular canned vegetables. Regular canned tomato sauce and paste. Regular tomato and vegetable juices. °Fruits °Dried fruits. Canned fruit in light or heavy syrup. Fruit juice. °Meat and Other Protein Products °Fatty cuts of meat. Ribs, chicken wings, bacon, sausage, bologna, salami, chitterlings, fatback, hot dogs, bratwurst, and packaged luncheon meats. Salted nuts and seeds. Canned beans with salt. °Dairy °Whole or 2% milk, cream, half-and-half, and cream cheese. Whole-fat or sweetened yogurt. Full-fat   cheeses or blue cheese. Nondairy creamers and whipped toppings. Processed cheese, cheese spreads, or cheese  curds. °Condiments °Onion and garlic salt, seasoned salt, table salt, and sea salt. Canned and packaged gravies. Worcestershire sauce. Tartar sauce. Barbecue sauce. Teriyaki sauce. Soy sauce, including reduced sodium. Steak sauce. Fish sauce. Oyster sauce. Cocktail sauce. Horseradish. Ketchup and mustard. Meat flavorings and tenderizers. Bouillon cubes. Hot sauce. Tabasco sauce. Marinades. Taco seasonings. Relishes. °Fats and Oils °Butter, stick margarine, lard, shortening, ghee, and bacon fat. Coconut, palm kernel, or palm oils. Regular salad dressings. °Other °Pickles and olives. Salted popcorn and pretzels. °The items listed above may not be a complete list of foods and beverages to avoid. Contact your dietitian for more information. °WHERE CAN I FIND MORE INFORMATION? °[employer]: www.nhlbi.nih.gov/health/health-topics/topics/dash/ °Document Released: 11/06/2011 Document Revised: 04/03/2014 Document Reviewed: 09/21/2013 °[employer]® Patient Information ©2015 [employer]. This information is not intended to replace advice given to you by your health care provider. Make sure you discuss any questions you have with your health care provider. ° °

## 2015-08-22 NOTE — Progress Notes (Signed)
Subjective:  Patient ID: [name], female    DOB: Jul 26, 1946  Age: 69 y.o. MRN: [id number]  CC: Atrial Fibrillation and Congestive Heart Failure   HPI [name] presents for follow-up of atrial fibrillation. Patient denies any recent bouts of chest pain or palpitations. Additionally, patient is taking anticoagulants. Patient denies any recent excessive bleeding episodes including epistaxis, bleeding from the gums, genitalia, rectal bleeding or hematuria. Additionally there has been no excessive bruising.  She is also in stating that she is  continues to feel poorly and weak. She is able to stand briefly for ADLs but otherwise she is in a wheelchair. She did stand long enough to walk into a restaurant with one person supporting her earlier today for lunch. Today she is holding a three fourths empty bottle of Sprite. She says this is because she feels nauseous on the other hand she held down a full meal at a restaurant 2 hours ago. She did not bring blood sugar readings with her today but says they are doing okay. They are occasionally a little high.  Patient has not been coughing but she continues to be short of breath. There has been some increasing edema. His is primarily in the feet and ankles. The shortness of breath is moderate. It is specifically related to ambulation and similar activities. She is wearing her oxygen cannula at all times including in the office.    History Chaniqua has a past medical history of Anemia; Arthritis; Asthma; Cataract; CHF (congestive heart failure); Diabetes mellitus; Hyperlipidemia; Hypertension; CAD (coronary artery disease); Stroke; Oxygen dependent; Gait instability; GERD (gastroesophageal reflux disease); Obesity; Sleep apnea, obstructive (2008); Atrial fibrillation; and COPD (chronic obstructive pulmonary disease).   She has past surgical history that includes Cataract extraction w/ intraocular lens  implant, bilateral; Appendectomy;  Cholecystectomy; Carpal tunnel release; Cervical spine surgery; Cesarean section; Umbilical hernia repair; Knee arthroscopy; Colonoscopy (11/22/2002); Upper gastrointestinal endoscopy (11/22/2002); Colonoscopy (11/26/2005); Femur IM nail (Left, 05/08/2013); Hip surgery (Left); and Eye surgery.   Her family history includes Alzheimer's disease in her father and mother; Breast cancer in her sister; Cancer in her brother, mother, and sister; Colon cancer in her mother; Diabetes in her daughter, mother, and another family member; Heart attack in her mother, sister, and another family member; Heart disease in her brother, mother, and sister; Kidney disease in her mother; Migraines in her sister.She reports that she has never smoked. She has never used smokeless tobacco. She reports that she does not drink alcohol or use illicit drugs.  Outpatient Prescriptions Prior to Visit  Medication Sig Dispense Refill  . acetaminophen (TYLENOL) 325 MG tablet Take 2 tablets (650 mg total) by mouth every 6 (six) hours as needed for pain or fever.    Marland Kitchen alendronate (FOSAMAX) 70 MG tablet Take 1 tablet (70 mg total) by mouth every 7 (seven) days. 4 tablet 2  . Cholecalciferol ([employer] VITAMIN D3) 1000 UNITS CHEW Chew 1 tablet (1,000 Units total) by mouth daily. 30 tablet   . colchicine 0.6 MG tablet 1.2 mg then 0.6 mg one hour for pain 30 tablet 1  . CRESTOR 10 MG tablet TAKE 1 TABLET (10 MG TOTAL) BY MOUTH AT BEDTIME. 30 tablet 4  . diltiazem (CARDIZEM CD) 180 MG 24 hr capsule TAKE ONE CAPSULE BY MOUTH DAILY 30 capsule 2  . Elastic Bandages & Supports (JOBST RELIEF 30-40MMHG XL) MISC Wear daily for swelling in legs 2 each 11  . febuxostat (ULORIC) 40 MG tablet Take 1  tablet (40 mg total) by mouth daily. 90 tablet 1  . fexofenadine (ALLEGRA) 180 MG tablet Take 1 tablet (180 mg total) by mouth daily. 30 tablet 11  . fish oil-omega-3 fatty acids 1000 MG capsule Take 2 g by mouth daily.      Marland Kitchen gabapentin (NEURONTIN) 100 MG capsule  Take 1 capsule (100 mg total) by mouth 3 (three) times daily. 90 capsule 0  . HEMATINIC/FOLIC ACID 250-0 MG TABS TAKE 1 TABLET DAILY 30 each 0  . insulin aspart (NOVOLOG) 100 UNIT/ML injection Use as directed 3 vial PRN  . Insulin Detemir (LEVEMIR FLEXTOUCH) 100 UNIT/ML Pen Inject 50 Units into the skin 2 (two) times daily. 30 mL 0  . Lancets MISC     . lisinopril (PRINIVIL,ZESTRIL) 20 MG tablet Take 1 tablet (20 mg total) by mouth daily. 30 tablet 2  . metFORMIN (GLUCOPHAGE) 850 MG tablet Take 1 tablet (850 mg total) by mouth 2 (two) times daily. 60 tablet 5  . metolazone (ZAROXOLYN) 2.5 MG tablet 1 po on Sat and wednesday 30 tablet 1  . torsemide (DEMADEX) 20 MG tablet TAKE 2 TABLETS (40 MG TOTAL) BY MOUTH 2 (TWO) TIMES DAILY. 120 tablet 2  . warfarin (COUMADIN) 5 MG tablet TAKE 1 TABLET BY MOUTH EVERY EVENING EXCEPT MONDAY. NO DOSE MONDAY 45 tablet 2   No facility-administered medications prior to visit.    ROS Review of Systems  Constitutional: Negative for fever, chills, diaphoresis, appetite change, fatigue and unexpected weight change.  HENT: Negative for congestion, ear pain, hearing loss, postnasal drip, rhinorrhea, sneezing, sore throat and trouble swallowing.   Eyes: Negative for pain.  Respiratory: Positive for cough, shortness of breath and wheezing. Negative for chest tightness and stridor.   Cardiovascular: Negative for chest pain and palpitations.  Gastrointestinal: Positive for nausea. Negative for vomiting, abdominal pain, diarrhea and constipation.  Genitourinary: Negative for dysuria, frequency and menstrual problem.  Musculoskeletal: Positive for myalgias, back pain (severe. She states that her MRI from [facility] confirmed a compression fracture. Pain is 10/10) and arthralgias. Negative for joint swelling.  Skin: Negative for rash.  Neurological: Positive for weakness. Negative for dizziness, syncope, numbness and headaches.  Psychiatric/Behavioral: Positive for  decreased concentration. Negative for dysphoric mood and agitation. The patient is nervous/anxious.     Objective:  BP 134/59 mmHg  Pulse 71  Temp(Src) 97.5 F (36.4 C) (Oral)  Ht 5' 6"  (1.676 m)  Wt 283 lb 12.8 oz (128.731 kg)  BMI 45.83 kg/m2  BP Readings from Last 3 Encounters:  08/22/15 134/59  08/15/15 120/63  08/09/15 88/56    Wt Readings from Last 3 Encounters:  08/22/15 283 lb 12.8 oz (128.731 kg)  08/15/15 284 lb 6.4 oz (129.003 kg)  07/20/15 289 lb 3.2 oz (131.18 kg)     Physical Exam  Constitutional: She is oriented to person, place, and time. She appears well-developed and well-nourished. No distress.  HENT:  Head: Normocephalic and atraumatic.  Right Ear: External ear normal.  Left Ear: External ear normal.  Nose: Nose normal.  Mouth/Throat: Oropharynx is clear and moist.  Eyes: Conjunctivae and EOM are normal. Pupils are equal, round, and reactive to light.  Neck: Normal range of motion. Neck supple. No thyromegaly present.  Cardiovascular: Normal rate, regular rhythm and normal heart sounds.   No murmur heard. Pulmonary/Chest: No accessory muscle usage. Tachypnea noted. No apnea. No respiratory distress. She has wheezes in the right middle field, the right lower field and the left lower field.  She has rhonchi in the right middle field, the right lower field, the left middle field and the left lower field. She has no rales. She exhibits no tenderness.  Abdominal: Soft. Bowel sounds are normal. She exhibits no distension. There is no tenderness.  Lymphadenopathy:    She has no cervical adenopathy.  Neurological: She is alert and oriented to person, place, and time. She has normal reflexes.  Skin: Skin is warm and dry.  Psychiatric: She has a normal mood and affect. Her behavior is normal. Judgment and thought content normal.    Lab Results  Component Value Date   HGBA1C 6.8 06/25/2015   HGBA1C 6.7 03/22/2015   HGBA1C 6.4 09/15/2014    Lab Results    Component Value Date   WBC 8.2 06/25/2015   HGB 9.2* 06/25/2015   HCT 29.4* 06/25/2015   PLT 193 10/19/2014   GLUCOSE 145* 08/09/2015   CHOL 98* 06/25/2015   TRIG 118 06/25/2015   HDL 42 06/25/2015   LDLCALC 32 06/25/2015   ALT 12 06/25/2015   AST 14 06/25/2015   NA 138 08/09/2015   K 5.9* 08/09/2015   CL 94* 08/09/2015   CREATININE 2.16* 08/09/2015   BUN 67* 08/09/2015   CO2 24 08/09/2015   TSH 2.960 03/22/2015   INR 2.3 08/09/2015   HGBA1C 6.8 06/25/2015    No results found.  Assessment & Plan:   Adelyne was seen today for atrial fibrillation and congestive heart failure.  Diagnoses and all orders for this visit:  Combined systolic and diastolic congestive heart failure, unspecified congestive heart failure chronicity -     BMP8+EGFR -     Brain natriuretic peptide -     D-dimer, quantitative (not at [facility]) -     DG Chest 2 View; Future  Diabetic nephropathy associated with type 2 diabetes mellitus -     BMP8+EGFR  Chronic renal insufficiency, stage 4 (severe) -     BMP8+EGFR  COPD exacerbation -     BMP8+EGFR -     Brain natriuretic peptide -     D-dimer, quantitative (not at [facility]) -     DG Chest 2 View; Future -     Ambulatory referral to Pulmonology  Compression fx, lumbar spine, closed, initial encounter -     Ambulatory referral to Interventional Radiology   I am having Ms. Vue maintain her fish oil-omega-3 fatty acids, alendronate, acetaminophen, Lancets, gabapentin, metolazone, Cholecalciferol, HEMATINIC/FOLIC ACID, fexofenadine, CRESTOR, JOBST RELIEF 30-40MMHG XL, Insulin Detemir, metFORMIN, warfarin, torsemide, colchicine, febuxostat, lisinopril, insulin aspart, and diltiazem.  No orders of the defined types were placed in this encounter.     Follow-up: Return in about 1 week (around 08/29/2015).  Claretta Fraise, M.D.

## 2015-08-23 LAB — BMP8+EGFR
BUN/Creatinine Ratio: 20 (ref 11–26)
BUN: 26 mg/dL (ref 8–27)
CALCIUM: 9 mg/dL (ref 8.7–10.3)
CO2: 22 mmol/L (ref 18–29)
Chloride: 99 mmol/L (ref 97–108)
Creatinine, Ser: 1.33 mg/dL — ABNORMAL HIGH (ref 0.57–1.00)
GFR, EST AFRICAN AMERICAN: 46 mL/min/{1.73_m2} — AB (ref >59)
GFR, EST NON AFRICAN AMERICAN: 40 mL/min/{1.73_m2} — AB (ref >59)
Glucose: 213 mg/dL — ABNORMAL HIGH (ref 65–99)
Potassium: 5.1 mmol/L (ref 3.5–5.2)
Sodium: 140 mmol/L (ref 134–144)

## 2015-08-23 LAB — D-DIMER, QUANTITATIVE (NOT AT ARMC): D-DIMER: 1.22 mg{FEU}/L — AB (ref 0.00–0.49)

## 2015-08-23 LAB — BRAIN NATRIURETIC PEPTIDE: BNP: 227.8 pg/mL — ABNORMAL HIGH (ref 0.0–100.0)

## 2015-08-27 ENCOUNTER — Other Ambulatory Visit: Payer: Self-pay | Admitting: *Deleted

## 2015-08-27 ENCOUNTER — Ambulatory Visit: Payer: Medicare HMO | Admitting: Family Medicine

## 2015-08-27 DIAGNOSIS — R7989 Other specified abnormal findings of blood chemistry: Secondary | ICD-10-CM

## 2015-08-28 ENCOUNTER — Ambulatory Visit (HOSPITAL_COMMUNITY)
Admission: RE | Admit: 2015-08-28 | Discharge: 2015-08-28 | Disposition: A | Discharge status: Home / Self Care | Payer: Medicare HMO | Source: Ambulatory Visit | Attending: Family Medicine | Admitting: Family Medicine

## 2015-08-28 DIAGNOSIS — I2510 Atherosclerotic heart disease of native coronary artery without angina pectoris: Secondary | ICD-10-CM | POA: Diagnosis not present

## 2015-08-28 DIAGNOSIS — J90 Pleural effusion, not elsewhere classified: Secondary | ICD-10-CM | POA: Insufficient documentation

## 2015-08-28 DIAGNOSIS — R7989 Other specified abnormal findings of blood chemistry: Secondary | ICD-10-CM

## 2015-08-28 DIAGNOSIS — R791 Abnormal coagulation profile: Secondary | ICD-10-CM | POA: Diagnosis present

## 2015-08-28 MED ORDER — IOHEXOL 350 MG/ML SOLN
100.0000 mL | Freq: Once | INTRAVENOUS | Status: AC | PRN
  Administered 2015-08-28: 100 mL via INTRAVENOUS

## 2015-08-29 ENCOUNTER — Encounter: Payer: Self-pay | Admitting: Family Medicine

## 2015-08-29 ENCOUNTER — Ambulatory Visit (INDEPENDENT_AMBULATORY_CARE_PROVIDER_SITE_OTHER): Payer: Medicare HMO | Admitting: Family Medicine

## 2015-08-29 VITALS — BP 134/61 | HR 80 | Temp 97.6°F | Ht 66.0 in | Wt 285.6 lb

## 2015-08-29 DIAGNOSIS — Z794 Long term (current) use of insulin: Secondary | ICD-10-CM | POA: Diagnosis not present

## 2015-08-29 DIAGNOSIS — I482 Chronic atrial fibrillation, unspecified: Secondary | ICD-10-CM

## 2015-08-29 DIAGNOSIS — I5022 Chronic systolic (congestive) heart failure: Secondary | ICD-10-CM | POA: Diagnosis not present

## 2015-08-29 DIAGNOSIS — E119 Type 2 diabetes mellitus without complications: Secondary | ICD-10-CM

## 2015-08-29 DIAGNOSIS — I270 Primary pulmonary hypertension: Secondary | ICD-10-CM

## 2015-08-29 MED ORDER — TORSEMIDE 20 MG PO TABS: 60.0000 mg | ORAL_TABLET | Freq: Two times a day (BID) | ORAL | Status: DC

## 2015-08-29 NOTE — Progress Notes (Signed)
Subjective:  Patient ID: [name], female    DOB: 08-13-1946  Age: 69 y.o. MRN: [id number]  CC: No chief complaint on file.   HPI MARIUM PETRI presents for recheck of her pulmonary hypertension and COPD. She is breathing much better. She is using her oxygen at all times. She has the cannula attached today. She continues to have edema. Due to her congestive heart failure she does get dyspneic on exertion for walking across the house. (Mild elevation of her BNP noted on 9/21 at 228). The oxygen helps with this. However it does not resolve completely.   Patient in for follow-up of atrial fibrillation. Patient denies any recent bouts of chest pain or palpitations. Additionally, patient is taking anticoagulants. Patient denies any recent excessive bleeding episodes including epistaxis, bleeding from the gums, genitalia, rectal bleeding or hematuria. Additionally there has been no excessive bruising.  Blood glucose logs not returned for her diabetes today. She checks it periodically. She says it has been running okay. Of note is that glucose was 213 one week ago when checked. Patient admits to not always following her diabetic diet. However her appetite has not been good lately.    History Rama has a past medical history of Anemia; Arthritis; Asthma; Cataract; CHF (congestive heart failure); Diabetes mellitus; Hyperlipidemia; Hypertension; CAD (coronary artery disease); Stroke; Oxygen dependent; Gait instability; GERD (gastroesophageal reflux disease); Obesity; Sleep apnea, obstructive (2008); Atrial fibrillation; and COPD (chronic obstructive pulmonary disease).   She has past surgical history that includes Cataract extraction w/ intraocular lens  implant, bilateral; Appendectomy; Cholecystectomy; Carpal tunnel release; Cervical spine surgery; Cesarean section; Umbilical hernia repair; Knee arthroscopy; Colonoscopy (11/22/2002); Upper gastrointestinal endoscopy (11/22/2002);  Colonoscopy (11/26/2005); Femur IM nail (Left, 05/08/2013); Hip surgery (Left); and Eye surgery.   Her family history includes Alzheimer's disease in her father and mother; Breast cancer in her sister; Cancer in her brother, mother, and sister; Colon cancer in her mother; Diabetes in her daughter, mother, and another family member; Heart attack in her mother, sister, and another family member; Heart disease in her brother, mother, and sister; Kidney disease in her mother; Migraines in her sister.She reports that she has never smoked. She has never used smokeless tobacco. She reports that she does not drink alcohol or use illicit drugs.  Outpatient Prescriptions Prior to Visit  Medication Sig Dispense Refill  . acetaminophen (TYLENOL) 325 MG tablet Take 2 tablets (650 mg total) by mouth every 6 (six) hours as needed for pain or fever.    Marland Kitchen alendronate (FOSAMAX) 70 MG tablet Take 1 tablet (70 mg total) by mouth every 7 (seven) days. 4 tablet 2  . Cholecalciferol ([employer]S VITAMIN D3) 1000 UNITS CHEW Chew 1 tablet (1,000 Units total) by mouth daily. 30 tablet   . colchicine 0.6 MG tablet 1.2 mg then 0.6 mg one hour for pain 30 tablet 1  . CRESTOR 10 MG tablet TAKE 1 TABLET (10 MG TOTAL) BY MOUTH AT BEDTIME. 30 tablet 4  . diltiazem (CARDIZEM CD) 180 MG 24 hr capsule TAKE ONE CAPSULE BY MOUTH DAILY 30 capsule 2  . Elastic Bandages & Supports (JOBST RELIEF 30-40MMHG XL) MISC Wear daily for swelling in legs 2 each 11  . febuxostat (ULORIC) 40 MG tablet Take 1 tablet (40 mg total) by mouth daily. 90 tablet 1  . fexofenadine (ALLEGRA) 180 MG tablet Take 1 tablet (180 mg total) by mouth daily. 30 tablet 11  . fish oil-omega-3 fatty acids 1000 MG capsule Take 2  g by mouth daily.      Marland Kitchen gabapentin (NEURONTIN) 100 MG capsule Take 1 capsule (100 mg total) by mouth 3 (three) times daily. 90 capsule 0  . HEMATINIC/FOLIC ACID 99991111 MG TABS TAKE 1 TABLET DAILY 30 each 0  . insulin aspart (NOVOLOG) 100 UNIT/ML injection  Use as directed 3 vial PRN  . Insulin Detemir (LEVEMIR FLEXTOUCH) 100 UNIT/ML Pen Inject 50 Units into the skin 2 (two) times daily. 30 mL 0  . Lancets MISC     . lisinopril (PRINIVIL,ZESTRIL) 20 MG tablet Take 1 tablet (20 mg total) by mouth daily. 30 tablet 2  . metFORMIN (GLUCOPHAGE) 850 MG tablet Take 1 tablet (850 mg total) by mouth 2 (two) times daily. 60 tablet 5  . metolazone (ZAROXOLYN) 2.5 MG tablet 1 po on Sat and wednesday 30 tablet 1  . warfarin (COUMADIN) 5 MG tablet TAKE 1 TABLET BY MOUTH EVERY EVENING EXCEPT MONDAY. NO DOSE MONDAY 45 tablet 2  . torsemide (DEMADEX) 20 MG tablet TAKE 2 TABLETS (40 MG TOTAL) BY MOUTH 2 (TWO) TIMES DAILY. 120 tablet 2   No facility-administered medications prior to visit.    ROS Review of Systems  Objective:  BP 134/61 mmHg  Pulse 80  Temp(Src) 97.6 F (36.4 C) (Oral)  Ht 5\' 6"  (1.676 m)  Wt 285 lb 9.6 oz (129.547 kg)  BMI 46.12 kg/m2  BP Readings from Last 3 Encounters:  08/29/15 134/61  08/22/15 134/59  08/15/15 120/63    Wt Readings from Last 3 Encounters:  08/29/15 285 lb 9.6 oz (129.547 kg)  08/22/15 283 lb 12.8 oz (128.731 kg)  08/15/15 284 lb 6.4 oz (129.003 kg)     Physical Exam  Lab Results  Component Value Date   HGBA1C 6.8 06/25/2015   HGBA1C 6.7 03/22/2015   HGBA1C 6.4 09/15/2014    Lab Results  Component Value Date   WBC 8.2 06/25/2015   HGB 9.2* 06/25/2015   HCT 29.4* 06/25/2015   PLT 193 10/19/2014   GLUCOSE 213* 08/22/2015   CHOL 98* 06/25/2015   TRIG 118 06/25/2015   HDL 42 06/25/2015   LDLCALC 32 06/25/2015   ALT 12 06/25/2015   AST 14 06/25/2015   NA 140 08/22/2015   K 5.1 08/22/2015   CL 99 08/22/2015   CREATININE 1.33* 08/22/2015   BUN 26 08/22/2015   CO2 22 08/22/2015   TSH 2.960 03/22/2015   INR 2.3 08/09/2015   HGBA1C 6.8 06/25/2015    Ct Angio Chest W/cm &/or Wo Cm  08/28/2015   CLINICAL DATA:  ELEVATED D-DIMER, SHORTNESS OF BREATH, DIABETES  EXAM: CT ANGIOGRAPHY CHEST WITH  CONTRAST  TECHNIQUE: Multidetector CT imaging of the chest was performed using the standard protocol during bolus administration of intravenous contrast. Multiplanar CT image reconstructions and MIPs were obtained to evaluate the vascular anatomy.  CONTRAST:  162mL OMNIPAQUE IOHEXOL 350 MG/ML SOLN  COMPARISON:  02/23/2014  FINDINGS: left arm right contrast injection. svc patent. biatrial enlargement. rv is nondilated. Dilated central pulmonary arteries suggesting pulmonary hypertension. Satisfactory opacification of pulmonary arteries noted, and there is no evidence of pulmonary emboli. motion during the acquisition degrades some of the images. bilateral pulmonary veins patent. scattered coronary calcifications. Adequate contrast opacification of the thoracic aorta with no evidence of dissection, aneurysm, or stenosis. There is classic 3-vessel brachiocephalic arch anatomy without proximal stenosis.  subcentimeter prevascular and right paratracheal lymph nodes. 12 mm left hilar node, previously 11 mm. small right pleural effusion, decreased since previous  exam. Loculated fluid in the right major fissure. Cervical fixation hardware partially seen. Peripheral subsegmental atelectasis in the right middle and lower lobes near the lung base. Perihilar mild somewhat geographic ground-glass opacities around the left hilum. Bridging osteophytes across multiple levels in the mid and lower thoracic spine. Sternum intact. Partially calcified splenic artery or branch aneurysms near the splenic hilum. Remainder visualized upper abdomen unremarkable.  Review of the MIP images confirms the above findings.  IMPRESSION: 1. Negative for acute PE or thoracic aortic dissection. 2. Small right pleural effusion, decreased since previous exam. 3. Coronary calcifications.   Electronically Signed   By: Lucrezia Europe M.D.   On: 08/28/2015 14:54    Assessment & Plan:   Diagnoses and all orders for this visit:  PULMONARY  HYPERTENSION  Long-term current use of insulin for diabetes mellitus  Chronic atrial fibrillation  Chronic systolic congestive heart failure  Other orders -     torsemide (DEMADEX) 20 MG tablet; Take 3 tablets (60 mg total) by mouth 2 (two) times daily. With breakfast and again around 2 to 3 PM  I have changed Ms. Glazer's torsemide. I am also having her maintain her fish oil-omega-3 fatty acids, alendronate, acetaminophen, Lancets, gabapentin, metolazone, Cholecalciferol, HEMATINIC/FOLIC ACID, fexofenadine, CRESTOR, JOBST RELIEF 30-40MMHG XL, Insulin Detemir, metFORMIN, warfarin, colchicine, febuxostat, lisinopril, insulin aspart, diltiazem, and KLOR-CON M20.  Meds ordered this encounter  Medications  . KLOR-CON M20 20 MEQ tablet    Sig: Take 20 mEq by mouth daily.    Refill:  4  . torsemide (DEMADEX) 20 MG tablet    Sig: Take 3 tablets (60 mg total) by mouth 2 (two) times daily. With breakfast and again around 2 to 3 PM    Dispense:  120 tablet    Refill:  2     Follow-up: Return in about 3 weeks (around 09/19/2015) for COPD CHF, diabetes.  Claretta Fraise, M.D.

## 2015-09-04 ENCOUNTER — Other Ambulatory Visit: Payer: Self-pay | Admitting: Family Medicine

## 2015-09-05 DIAGNOSIS — G4733 Obstructive sleep apnea (adult) (pediatric): Secondary | ICD-10-CM | POA: Diagnosis not present

## 2015-09-05 NOTE — Telephone Encounter (Signed)
Multiple attempts to contact patient this encounter will now be closed

## 2015-09-10 DIAGNOSIS — J449 Chronic obstructive pulmonary disease, unspecified: Secondary | ICD-10-CM | POA: Diagnosis not present

## 2015-09-11 ENCOUNTER — Ambulatory Visit (INDEPENDENT_AMBULATORY_CARE_PROVIDER_SITE_OTHER): Payer: Medicare HMO | Admitting: Internal Medicine

## 2015-09-11 ENCOUNTER — Encounter: Payer: Self-pay | Admitting: Internal Medicine

## 2015-09-11 VITALS — BP 138/60 | HR 68 | Ht 66.0 in | Wt 287.0 lb

## 2015-09-11 DIAGNOSIS — Z23 Encounter for immunization: Secondary | ICD-10-CM | POA: Diagnosis not present

## 2015-09-11 DIAGNOSIS — J9611 Chronic respiratory failure with hypoxia: Secondary | ICD-10-CM

## 2015-09-11 NOTE — Progress Notes (Signed)
Subjective:    Patient ID: [name], female    DOB: June 14, 1946, 69 y.o.   MRN: [id number]  HPI  60 yowf never smoker with morbid obesity complicated by osa/PH previoulsy eval by Elsworth Soho with nl airflow by pfts in 2009 but still carries dx of copd and admit to [facility] p [facility]a with acute kidney failure and referred to pulmonary clinic 09/11/2015 for eval for unexplained spb   09/11/2015 1st [facility] office visit/ Haniyyah Sakuma   Chief Complaint  Patient presents with  . Pulmonary Consult    Referred by Dr. Livia Snellen. Pt states she was dxed with COPD approx 6 yrs ago. She c/o worsening SOB for since Summer 2016.  She states she gets SOB just walking approx 10 ft.   02 2lpm 24/7 since 2012   Slow pace anywhere she wanted but stopped with hot weather and hasn't been back since/ limited by back pain / sleeping on cpap ok with 2 pillows Did not have as much leg swelling before breathing deteriorated over the summer with dry cough while  on ACEi which was stopped when admitted with Acute kidney failure p CTa chest done to evaluate sob and cough.  Still on prednisone/ breathing and coughing much better, still some leg swelling  No obvious other patterns in day to day or daytime variabilty or assoc chronic cough or cp or chest tightness, subjective wheeze overt sinus or hb symptoms. No unusual exp hx or h/o childhood pna/ asthma or knowledge of premature birth.  Sleeping ok without nocturnal  or early am exacerbation  of respiratory  c/o's or need for noct saba. Also denies any obvious fluctuation of symptoms with weather or environmental changes or other aggravating or alleviating factors except as outlined above   Current Medications, Allergies, Complete Past Medical History, Past Surgical History, Family History, and Social History were reviewed in [employer] record.           Review of Systems  Constitutional: Negative for fever, chills and unexpected weight change.    HENT: Negative for congestion, dental problem, ear pain, nosebleeds, postnasal drip, rhinorrhea, sinus pressure, sneezing, sore throat, trouble swallowing and voice change.   Eyes: Negative for visual disturbance.  Respiratory: Positive for shortness of breath. Negative for cough and choking.   Cardiovascular: Positive for leg swelling. Negative for chest pain.  Gastrointestinal: Negative for vomiting, abdominal pain and diarrhea.  Genitourinary: Negative for difficulty urinating.  Musculoskeletal: Positive for arthralgias.  Skin: Negative for rash.  Neurological: Negative for tremors, syncope and headaches.  Hematological: Does not bruise/bleed easily.       Objective:   Physical Exam  amb obese wf nad in w/c  Wt Readings from Last 3 Encounters:  09/11/15 287 lb (130.182 kg)  08/29/15 285 lb 9.6 oz (129.547 kg)  08/22/15 283 lb 12.8 oz (128.731 kg)    Vital signs reviewed   HEENT: nl dentition, turbinates, and orophanx. Nl external ear canals without cough reflex   NECK :  without JVD/Nodes/TM/ nl carotid upstrokes bilaterally   LUNGS: no acc muscle use, clear to A and P bilaterally without cough on insp or exp maneuvers   CV:  RRR  no s3 or murmur or increase in P2,  2+ pitting bilateral lower ext  edema   ABD:  soft and nontender with nl excursion in the supine position. No bruits or organomegaly, bowel sounds nl  MS:  warm without deformities, calf tenderness, cyanosis or clubbing  SKIN: warm  and dry without lesions    NEURO:  alert, approp, no deficits    I personally reviewed images and agree with radiology impression as follows:  CTa chest :  08/28/15 1. Negative for acute PE or thoracic aortic dissection. 2. Small right pleural effusion, decreased since previous exam. 3. Coronary calcifications.            Assessment & Plan:

## 2015-09-11 NOTE — Patient Instructions (Addendum)
No change in your medications for now but you should definitely stop the lisinopril forever  Please see patient coordinator before you leave today  to schedule overnight oximetry on your cpap and 02 set up the way it is now   We need to set you up to follow up with Dr Elsworth Soho next available for sleep (since he has seen you before and he is a sleep specialist)

## 2015-09-12 ENCOUNTER — Encounter: Payer: Self-pay | Admitting: Internal Medicine

## 2015-09-12 NOTE — Assessment & Plan Note (Signed)
Body mass index is 46.35 kg/(m^2).  Lab Results  Component Value Date   TSH 2.960 03/22/2015     Contributing to gerd tendency/ doe/reviewed the need and the process to achieve and maintain neg calorie balance > defer f/u primary care including intermittently monitoring thyroid status

## 2015-09-12 NOTE — Assessment & Plan Note (Signed)
pfts 03/30/08  No airflow obst  09/11/2015   Walked 2lpm  x one lap @ 185 stopped due to  Sob slow pace, no desat   Her worsening symptoms over the summer of 2016 that necessitated the CTa are c/w ACEi effects or diastolic dysfunction but not copd or asthma by hx obtained today and I would be happy to see her back if breathing worsens off all resp rx x for 02 which she will need for the foreseeable future (until she loses wt )  I had an extended discussion with the patient reviewing all relevant studies completed to date and  lasting 34 m of 60 minute visit    Each maintenance medication was reviewed in detail including most importantly the difference between maintenance and prns and under what circumstances the prns are to be triggered using an action plan format that is not reflected in the computer generated alphabetically organized AVS.    Please see instructions for details which were reviewed in writing and the patient given a copy highlighting the part that I personally wrote and discussed at today's ov.

## 2015-09-17 ENCOUNTER — Ambulatory Visit: Payer: Self-pay | Admitting: Pharmacist

## 2015-09-17 ENCOUNTER — Encounter: Payer: Self-pay | Admitting: Family Medicine

## 2015-09-19 ENCOUNTER — Ambulatory Visit: Payer: Medicare HMO | Admitting: Family Medicine

## 2015-09-24 ENCOUNTER — Encounter: Payer: Self-pay | Admitting: Family Medicine

## 2015-09-24 ENCOUNTER — Ambulatory Visit (INDEPENDENT_AMBULATORY_CARE_PROVIDER_SITE_OTHER): Payer: Commercial Managed Care - HMO | Admitting: Family Medicine

## 2015-09-24 DIAGNOSIS — E1121 Type 2 diabetes mellitus with diabetic nephropathy: Secondary | ICD-10-CM | POA: Diagnosis not present

## 2015-09-24 DIAGNOSIS — Z794 Long term (current) use of insulin: Secondary | ICD-10-CM | POA: Diagnosis not present

## 2015-09-24 DIAGNOSIS — N184 Chronic kidney disease, stage 4 (severe): Secondary | ICD-10-CM

## 2015-09-24 DIAGNOSIS — E119 Type 2 diabetes mellitus without complications: Secondary | ICD-10-CM

## 2015-09-24 LAB — POCT GLYCOSYLATED HEMOGLOBIN (HGB A1C): Hemoglobin A1C: 6.6

## 2015-09-24 MED ORDER — FLUOCINONIDE-E 0.05 % EX CREA: TOPICAL_CREAM | CUTANEOUS | Status: DC

## 2015-09-24 MED ORDER — DILTIAZEM HCL ER COATED BEADS 360 MG PO CP24: 360.0000 mg | ORAL_CAPSULE | Freq: Every day | ORAL | Status: DC

## 2015-09-24 NOTE — Progress Notes (Addendum)
Subjective:  Patient ID: [name], female    DOB: [birth year]  Age: 69 y.o. MRN: [id number]  CC: Congestive Heart Failure; Diabetes; and COPD   HPI [name] presents for  Recheck of COPD/CHF/ edema & Dyspnea. The swelling hhas decreased and she is breathing comfortably. Can still get winded easily and is in a wheelchair frequently because of this. Als here for follow-up of hypertension. Patient has no history of headache chest pain or recent cough. Patient also denies symptoms of TIA such as numbness weakness lateralizing. Patient checks  blood pressure at home and has not had any elevated readings recently. Patient denies side effects from his medication. States taking it regularly.  Patient also  in for follow-up of elevated cholesterol. Doing well without complaints on current medication. Denies side effects of statin including myalgia and arthralgia and nausea. Also in today for liver function testing. Currently no chest pain, shortness of breath or other cardiovascular related symptoms noted.  Follow-up of diabetes. Patient does check blood sugar at home. Readings run between 120 and 150 Patient denies symptoms such as polyuria, polydipsia, excessive hunger, nausea No significant hypoglycemic spells noted. Renal insufficincy improved in that swelling has decreased. Medications as noted below. Taking them regularly without complication/adverse reaction being reported today.   Feet scale frequently. ITch at times. Uses lotion on them frequently.   History Lizmarie has a past medical history of Anemia; Arthritis; Asthma; Cataract; CHF (congestive heart failure) ([place]); Diabetes mellitus; Hyperlipidemia; Hypertension; CAD (coronary artery disease); Stroke [facility]); Oxygen dependent; Gait instability; GERD (gastroesophageal reflux disease); Obesity; Sleep apnea, obstructive (2008); Atrial fibrillation ([place]); and COPD (chronic obstructive pulmonary disease) ([place]).   She y  Her  family history includes Alzheimer's disease in her father and mother; Asthma in her daughter and grandchild; Breast cancer in her sister; Cancer in her brother, mother, and sister; Colon cancer in her mother; Diabetes in her daughter, mother, and another family member; Heart attack in her mother, sister, and another family member; Heart disease in her brother, mother, and sister; Kidney disease in her mother; Migraines in her sister.She reports that she has never smoked. She has never used smokeless tobacco. She reports that she does not drink alcohol or use illicit drugs.  Current Outpatient Prescriptions on File Prior to Visit  Medication Sig Dispense Refill  . acetaminophen (TYLENOL) 325 MG tablet Take 2 tablets (650 mg total) by mouth every 6 (six) hours as needed for pain or fever.    Marland Kitchen alendronate (FOSAMAX) 70 MG tablet Take 1 tablet (70 mg total) by mouth every 7 (seven) days. 4 tablet 2  . Cholecalciferol ([employer] VITAMIN D3) 1000 UNITS CHEW Chew 1 tablet (1,000 Units total) by mouth daily. 30 tablet   . colchicine 0.6 MG tablet 1.2 mg then 0.6 mg one hour for pain 30 tablet 1  . Elastic Bandages & Supports (JOBST RELIEF 30-40MMHG XL) MISC Wear daily for swelling in legs 2 each 11  . febuxostat (ULORIC) 40 MG tablet Take 1 tablet (40 mg total) by mouth daily. 90 tablet 1  . fexofenadine (ALLEGRA) 180 MG tablet Take 1 tablet (180 mg total) by mouth daily. 30 tablet 11  . fish oil-omega-3 fatty acids 1000 MG capsule Take 2 g by mouth daily.      Marland Kitchen gabapentin (NEURONTIN) 100 MG capsule Take 1 capsule (100 mg total) by mouth 3 (three) times daily. 90 capsule 0  . HEMATINIC/FOLIC ACID 324-4 MG TABS TAKE 1 TABLET DAILY 30 each  0  . insulin aspart (NOVOLOG) 100 UNIT/ML injection Use as directed 3 vial PRN  . Insulin Detemir (LEVEMIR FLEXTOUCH) 100 UNIT/ML Pen Inject 50 Units into the skin 2 (two) times daily. 30 mL 0  . KLOR-CON M20 20 MEQ tablet Take 20 mEq by mouth daily.  4  . Lancets MISC     .  metFORMIN (GLUCOPHAGE) 850 MG tablet Take 1 tablet (850 mg total) by mouth 2 (two) times daily. 60 tablet 5  . metolazone (ZAROXOLYN) 2.5 MG tablet 1 po on Sat and wednesday 30 tablet 1  . torsemide (DEMADEX) 20 MG tablet Take 3 tablets (60 mg total) by mouth 2 (two) times daily. With breakfast and again around 2 to 3 PM 120 tablet 2  . warfarin (COUMADIN) 5 MG tablet TAKE 1 TABLET BY MOUTH EVERY EVENING EXCEPT MONDAY. NO DOSE MONDAY 45 tablet 2   No current facility-administered medications on file prior to visit.    ROS Review of Systems  Constitutional: Negative for fever, chills, diaphoresis, appetite change, fatigue and unexpected weight change.  HENT: Negative for congestion, ear pain, hearing loss, postnasal drip, rhinorrhea, sneezing, sore throat and trouble swallowing.   Eyes: Negative for pain.  Respiratory: Positive for shortness of breath (currently at baseline). Negative for cough, chest tightness and wheezing.   Cardiovascular: Negative for chest pain and palpitations.  Gastrointestinal: Negative for nausea, vomiting, abdominal pain, diarrhea and constipation.  Genitourinary: Negative for dysuria, frequency and menstrual problem.  Musculoskeletal: Negative for joint swelling and arthralgias.  Skin: Negative for rash.  Neurological: Negative for dizziness, weakness, numbness and headaches.  Psychiatric/Behavioral: Negative for dysphoric mood and agitation.    Objective:  BP 151/70 mmHg  Pulse 81  Temp(Src) 97 F (36.1 C) (Oral)  Ht 5' 6"  (1.676 m)  Wt 282 lb 6.4 oz (128.096 kg)  BMI 45.60 kg/m2  BP Readings from Last 3 Encounters:  09/24/15 151/70  09/11/15 138/60  08/29/15 134/61    Wt Readings from Last 3 Encounters:  09/24/15 282 lb 6.4 oz (128.096 kg)  09/11/15 287 lb (130.182 kg)  08/29/15 285 lb 9.6 oz (129.547 kg)     Physical Exam  Constitutional: She is oriented to person, place, and time. She appears well-developed and well-nourished. No distress.    HENT:  Head: Normocephalic and atraumatic.  Right Ear: External ear normal.  Left Ear: External ear normal.  Mouth/Throat: Oropharynx is clear and moist.  Eyes: Conjunctivae and EOM are normal. Pupils are equal, round, and reactive to light.  Neck: Normal range of motion.  Cardiovascular: Normal rate and regular rhythm.   No murmur heard. Distal pulses faint, diminished at [facility] bilaterally  Pulmonary/Chest: Effort normal. No respiratory distress (Breath sounds distant, wearing oxygen).  Abdominal: Soft. She exhibits no distension. There is no tenderness.  Musculoskeletal: Normal range of motion. Edema: 1+ at ankles (weight down 5 lb.)  Neurological: She is alert and oriented to person, place, and time.  Skin: Skin is warm and dry.  Soles of feet peeling with moderate scaling and callus around heel margin deep creases indicate ulceration pending   Psychiatric: She has a normal mood and affect.    Lab Results  Component Value Date   HGBA1C 6.6 09/24/2015   HGBA1C 6.8 06/25/2015   HGBA1C 6.7 03/22/2015    Lab Results  Component Value Date   WBC 8.2 06/25/2015   HGB 9.2* 06/25/2015   HCT 29.4* 06/25/2015   PLT 193 10/19/2014   GLUCOSE 147*  09/24/2015   CHOL 116 09/24/2015   TRIG 188* 09/24/2015   HDL 42 09/24/2015   LDLCALC 32 06/25/2015   ALT 14 09/24/2015   AST 14 09/24/2015   NA 143 09/24/2015   K 4.5 09/24/2015   CL 100 09/24/2015   CREATININE 1.24* 09/24/2015   BUN 26 09/24/2015   CO2 23 09/24/2015   TSH 2.960 03/22/2015   INR 2.3 08/09/2015   HGBA1C 6.6 09/24/2015    Ct Angio Chest W/cm &/or Wo Cm  08/28/2015  CLINICAL DATA:  ELEVATED D-DIMER, SHORTNESS OF BREATH, DIABETES EXAM: CT ANGIOGRAPHY CHEST WITH CONTRAST TECHNIQUE: Multidetector CT imaging of the chest was performed using the standard protocol during bolus administration of intravenous contrast. Multiplanar CT image reconstructions and MIPs were obtained to evaluate the vascular anatomy. CONTRAST:   197m OMNIPAQUE IOHEXOL 350 MG/ML SOLN COMPARISON:  02/23/2014 FINDINGS: left arm right contrast injection. svc patent. biatrial enlargement. rv is nondilated. Dilated central pulmonary arteries suggesting pulmonary hypertension. Satisfactory opacification of pulmonary arteries noted, and there is no evidence of pulmonary emboli. motion during the acquisition degrades some of the images. bilateral pulmonary veins patent. scattered coronary calcifications. Adequate contrast opacification of the thoracic aorta with no evidence of dissection, aneurysm, or stenosis. There is classic 3-vessel brachiocephalic arch anatomy without proximal stenosis. subcentimeter prevascular and right paratracheal lymph nodes. 12 mm left hilar node, previously 11 mm. small right pleural effusion, decreased since previous exam. Loculated fluid in the right major fissure. Cervical fixation hardware partially seen. Peripheral subsegmental atelectasis in the right middle and lower lobes near the lung base. Perihilar mild somewhat geographic ground-glass opacities around the left hilum. Bridging osteophytes across multiple levels in the mid and lower thoracic spine. Sternum intact. Partially calcified splenic artery or branch aneurysms near the splenic hilum. Remainder visualized upper abdomen unremarkable. Review of the MIP images confirms the above findings. IMPRESSION: 1. Negative for acute PE or thoracic aortic dissection. 2. Small right pleural effusion, decreased since previous exam. 3. Coronary calcifications. Electronically Signed   By: DLucrezia EuropeM.D.   On: 08/28/2015 14:54    Assessment & Plan:   MThaowas seen today for congestive heart failure, diabetes and copd.  Diagnoses and all orders for this visit:  Morbid obesity, unspecified obesity type ([place] -     POCT glycosylated hemoglobin (Hb A1C) -     NMR, lipoprofile -     CMP14+EGFR -     Uric acid  Diabetic nephropathy associated with type 2 diabetes mellitus  (HCC) -     POCT glycosylated hemoglobin (Hb A1C) -     NMR, lipoprofile -     CMP14+EGFR -     Uric acid  Long-term current use of insulin for diabetes mellitus (HCC) -     POCT glycosylated hemoglobin (Hb A1C) -     NMR, lipoprofile -     CMP14+EGFR -     Uric acid  Chronic renal insufficiency, stage 4 (severe) (HCC) -     POCT glycosylated hemoglobin (Hb A1C) -     NMR, lipoprofile -     CMP14+EGFR -     Uric acid  Other orders -     fluocinonide-emollient (LIDEX-E) 0.05 % cream; Apply to feet after soaking each night -     diltiazem (CARDIZEM CD) 360 MG 24 hr capsule; Take 1 capsule (360 mg total) by mouth daily.   I have changed Ms. Schloesser's diltiazem. I am also having her start on fluocinonide-emollient. Additionally, I  am having her maintain her fish oil-omega-3 fatty acids, alendronate, acetaminophen, Lancets, gabapentin, metolazone, Cholecalciferol, HEMATINIC/FOLIC ACID, fexofenadine, JOBST RELIEF 30-40MMHG XL, Insulin Detemir, metFORMIN, warfarin, colchicine, febuxostat, insulin aspart, KLOR-CON M20, and torsemide.  Meds ordered this encounter  Medications  . fluocinonide-emollient (LIDEX-E) 0.05 % cream    Sig: Apply to feet after soaking each night    Dispense:  60 g    Refill:  5  . diltiazem (CARDIZEM CD) 360 MG 24 hr capsule    Sig: Take 1 capsule (360 mg total) by mouth daily.    Dispense:  30 capsule    Refill:  5   Diabetic foot care reviewed. Handout given  Follow-up: Return in about 1 month (around 10/25/2015) for diabetes, COPD, CHF.  Claretta Fraise, M.D.

## 2015-09-24 NOTE — Patient Instructions (Signed)
Diabetes and Foot Care Diabetes may cause you to have problems because of poor blood supply (circulation) to your feet and legs. This may cause the skin on your feet to become thinner, break easier, and heal more slowly. Your skin may become dry, and the skin may peel and crack. You may also have nerve damage in your legs and feet causing decreased feeling in them. You may not notice minor injuries to your feet that could lead to infections or more serious problems. Taking care of your feet is one of the most important things you can do for yourself.  HOME CARE INSTRUCTIONS  Wear shoes at all times, even in the house. Do not go barefoot. Bare feet are easily injured.  Check your feet daily for blisters, cuts, and redness. If you cannot see the bottom of your feet, use a mirror or ask someone for help.  Wash your feet with warm water (do not use hot water) and mild soap. Then pat your feet and the areas between your toes until they are completely dry. Do not soak your feet as this can dry your skin.  Apply a moisturizing lotion or petroleum jelly (that does not contain alcohol and is unscented) to the skin on your feet and to dry, brittle toenails. Do not apply lotion between your toes.  Trim your toenails straight across. Do not dig under them or around the cuticle. File the edges of your nails with an emery board or nail file.  Do not cut corns or calluses or try to remove them with medicine.  Wear clean socks or stockings every day. Make sure they are not too tight. Do not wear knee-high stockings since they may decrease blood flow to your legs.  Wear shoes that fit properly and have enough cushioning. To break in new shoes, wear them for just a few hours a day. This prevents you from injuring your feet. Always look in your shoes before you put them on to be sure there are no objects inside.  Do not cross your legs. This may decrease the blood flow to your feet.  If you find a minor scrape,  cut, or break in the skin on your feet, keep it and the skin around it clean and dry. These areas may be cleansed with mild soap and water. Do not cleanse the area with peroxide, alcohol, or iodine.  When you remove an adhesive bandage, be sure not to damage the skin around it.  If you have a wound, look at it several times a day to make sure it is healing.  Do not use heating pads or hot water bottles. They may burn your skin. If you have lost feeling in your feet or legs, you may not know it is happening until it is too late.  Make sure your health care provider performs a complete foot exam at least annually or more often if you have foot problems. Report any cuts, sores, or bruises to your health care provider immediately. SEEK MEDICAL CARE IF:   You have an injury that is not healing.  You have cuts or breaks in the skin.  You have an ingrown nail.  You notice redness on your legs or feet.  You feel burning or tingling in your legs or feet.  You have pain or cramps in your legs and feet.  Your legs or feet are numb.  Your feet always feel cold. SEEK IMMEDIATE MEDICAL CARE IF:   There is increasing redness,   swelling, or pain in or around a wound.  There is a red line that goes up your leg.  Pus is coming from a wound.  You develop a fever or as directed by your health care provider.  You notice a bad smell coming from an ulcer or wound.   This information is not intended to replace advice given to you by your health care provider. Make sure you discuss any questions you have with your health care provider.   Document Released: 11/14/2000 Document Revised: 07/20/2013 Document Reviewed: 04/26/2013 Elsevier Interactive Patient Education 2016 [employer]  

## 2015-09-25 ENCOUNTER — Other Ambulatory Visit (HOSPITAL_COMMUNITY): Payer: Self-pay | Admitting: Interventional Radiology

## 2015-09-25 DIAGNOSIS — N183 Chronic kidney disease, stage 3 (moderate): Secondary | ICD-10-CM | POA: Diagnosis not present

## 2015-09-25 DIAGNOSIS — Z79899 Other long term (current) drug therapy: Secondary | ICD-10-CM | POA: Diagnosis not present

## 2015-09-25 DIAGNOSIS — D509 Iron deficiency anemia, unspecified: Secondary | ICD-10-CM | POA: Diagnosis not present

## 2015-09-25 DIAGNOSIS — E559 Vitamin D deficiency, unspecified: Secondary | ICD-10-CM | POA: Diagnosis not present

## 2015-09-25 DIAGNOSIS — M545 Low back pain: Secondary | ICD-10-CM

## 2015-09-25 DIAGNOSIS — R809 Proteinuria, unspecified: Secondary | ICD-10-CM | POA: Diagnosis not present

## 2015-09-25 DIAGNOSIS — I129 Hypertensive chronic kidney disease with stage 1 through stage 4 chronic kidney disease, or unspecified chronic kidney disease: Secondary | ICD-10-CM | POA: Diagnosis not present

## 2015-09-25 LAB — CMP14+EGFR
ALBUMIN: 4 g/dL (ref 3.5–4.8)
ALT: 14 IU/L (ref 0–32)
AST: 14 IU/L (ref 0–40)
Albumin/Globulin Ratio: 1.7 (ref 1.1–2.5)
Alkaline Phosphatase: 135 IU/L — ABNORMAL HIGH (ref 39–117)
BUN / CREAT RATIO: 21 (ref 11–26)
BUN: 26 mg/dL (ref 8–27)
Bilirubin Total: 0.7 mg/dL (ref 0.0–1.2)
CALCIUM: 9.4 mg/dL (ref 8.7–10.3)
CO2: 23 mmol/L (ref 18–29)
CREATININE: 1.24 mg/dL — AB (ref 0.57–1.00)
Chloride: 100 mmol/L (ref 97–106)
GFR calc non Af Amer: 44 mL/min/{1.73_m2} — ABNORMAL LOW (ref >59)
GFR, EST AFRICAN AMERICAN: 50 mL/min/{1.73_m2} — AB (ref >59)
GLUCOSE: 147 mg/dL — AB (ref 65–99)
Globulin, Total: 2.4 g/dL (ref 1.5–4.5)
Potassium: 4.5 mmol/L (ref 3.5–5.2)
Sodium: 143 mmol/L (ref 136–144)
TOTAL PROTEIN: 6.4 g/dL (ref 6.0–8.5)

## 2015-09-25 LAB — NMR, LIPOPROFILE
Cholesterol: 116 mg/dL (ref 100–199)
HDL CHOLESTEROL BY NMR: 42 mg/dL (ref >39)
HDL PARTICLE NUMBER: 29.2 umol/L — AB (ref >=30.5)
LDL Particle Number: 585 nmol/L (ref <1000)
LDL Size: 20.1 nm (ref >20.5)
LDL-C: 36 mg/dL (ref 0–99)
LP-IR Score: 62 — ABNORMAL HIGH (ref <=45)
Small LDL Particle Number: 365 nmol/L (ref <=527)
Triglycerides by NMR: 188 mg/dL — ABNORMAL HIGH (ref 0–149)

## 2015-09-25 LAB — URIC ACID: Uric Acid: 3.3 mg/dL (ref 2.5–7.1)

## 2015-09-26 ENCOUNTER — Other Ambulatory Visit: Payer: Self-pay | Admitting: Family Medicine

## 2015-09-27 ENCOUNTER — Telehealth: Payer: Self-pay | Admitting: Internal Medicine

## 2015-09-27 NOTE — Telephone Encounter (Signed)
Per MW ONO on CPAP with 2lpm o2 normal  Spoke with pt and notified of results per Dr. Melvyn Novas. Pt verbalized understanding and denied any questions. (test done on 09/19/15 by Huey Romans)

## 2015-09-28 ENCOUNTER — Encounter (HOSPITAL_COMMUNITY): Payer: Self-pay

## 2015-09-28 ENCOUNTER — Ambulatory Visit (HOSPITAL_COMMUNITY): Payer: Self-pay

## 2015-10-03 ENCOUNTER — Ambulatory Visit (HOSPITAL_COMMUNITY): Payer: Commercial Managed Care - HMO

## 2015-10-03 ENCOUNTER — Encounter (HOSPITAL_COMMUNITY): Payer: Commercial Managed Care - HMO

## 2015-10-03 ENCOUNTER — Telehealth: Payer: Self-pay | Admitting: Family Medicine

## 2015-10-06 DIAGNOSIS — G4733 Obstructive sleep apnea (adult) (pediatric): Secondary | ICD-10-CM | POA: Diagnosis not present

## 2015-10-10 ENCOUNTER — Encounter (HOSPITAL_COMMUNITY): Payer: Commercial Managed Care - HMO

## 2015-10-10 ENCOUNTER — Ambulatory Visit (HOSPITAL_COMMUNITY)
Admission: RE | Admit: 2015-10-10 | Discharge: 2015-10-10 | Disposition: A | Discharge status: Home / Self Care | Payer: Commercial Managed Care - HMO | Source: Ambulatory Visit | Attending: Interventional Radiology | Admitting: Interventional Radiology

## 2015-10-10 DIAGNOSIS — M549 Dorsalgia, unspecified: Secondary | ICD-10-CM | POA: Diagnosis not present

## 2015-10-10 DIAGNOSIS — M545 Low back pain: Secondary | ICD-10-CM | POA: Diagnosis not present

## 2015-10-10 MED ORDER — TECHNETIUM TC 99M MEDRONATE IV KIT
25.7000 | PACK | Freq: Once | INTRAVENOUS | Status: AC | PRN
  Administered 2015-10-10: 25.7 via INTRAVENOUS

## 2015-10-11 DIAGNOSIS — J449 Chronic obstructive pulmonary disease, unspecified: Secondary | ICD-10-CM | POA: Diagnosis not present

## 2015-10-12 NOTE — Addendum Note (Signed)
Addended by: Claretta Fraise on: 10/12/2015 06:08 PM   Modules accepted: Miquel Dunn

## 2015-10-15 ENCOUNTER — Encounter: Payer: Self-pay | Admitting: Internal Medicine

## 2015-10-22 NOTE — Telephone Encounter (Signed)
Never received forms for shoes, pt aware. Told her she would need a foot exam and we could get them local.

## 2015-10-23 ENCOUNTER — Telehealth (HOSPITAL_COMMUNITY): Payer: Self-pay

## 2015-10-23 NOTE — Telephone Encounter (Signed)
Called to schedule MRI, no answer, no VM. AW

## 2015-10-26 ENCOUNTER — Ambulatory Visit: Payer: Commercial Managed Care - HMO | Admitting: Family Medicine

## 2015-10-31 ENCOUNTER — Ambulatory Visit: Payer: Commercial Managed Care - HMO | Admitting: Family Medicine

## 2015-11-05 DIAGNOSIS — G4733 Obstructive sleep apnea (adult) (pediatric): Secondary | ICD-10-CM | POA: Diagnosis not present

## 2015-11-08 ENCOUNTER — Telehealth (HOSPITAL_COMMUNITY): Payer: Self-pay

## 2015-11-08 DIAGNOSIS — G4733 Obstructive sleep apnea (adult) (pediatric): Secondary | ICD-10-CM | POA: Diagnosis not present

## 2015-11-08 NOTE — Telephone Encounter (Signed)
Called pt to schedule MRI, pt stated that she is feeling a lot better and will give [place] a call if things start to get worse. AW

## 2015-11-10 DIAGNOSIS — J449 Chronic obstructive pulmonary disease, unspecified: Secondary | ICD-10-CM | POA: Diagnosis not present

## 2015-11-12 ENCOUNTER — Encounter: Payer: Self-pay | Admitting: Family Medicine

## 2015-11-12 ENCOUNTER — Ambulatory Visit (INDEPENDENT_AMBULATORY_CARE_PROVIDER_SITE_OTHER): Payer: Commercial Managed Care - HMO | Admitting: Family Medicine

## 2015-11-12 VITALS — BP 121/52 | HR 74 | Temp 96.5°F | Ht 66.0 in | Wt 280.4 lb

## 2015-11-12 DIAGNOSIS — I5022 Chronic systolic (congestive) heart failure: Secondary | ICD-10-CM | POA: Diagnosis not present

## 2015-11-12 DIAGNOSIS — M1A3790 Chronic gout due to renal impairment, unspecified ankle and foot, without tophus (tophi): Secondary | ICD-10-CM | POA: Diagnosis not present

## 2015-11-12 DIAGNOSIS — I270 Primary pulmonary hypertension: Secondary | ICD-10-CM | POA: Diagnosis not present

## 2015-11-12 DIAGNOSIS — D696 Thrombocytopenia, unspecified: Secondary | ICD-10-CM

## 2015-11-12 DIAGNOSIS — E1121 Type 2 diabetes mellitus with diabetic nephropathy: Secondary | ICD-10-CM

## 2015-11-12 DIAGNOSIS — J9611 Chronic respiratory failure with hypoxia: Secondary | ICD-10-CM | POA: Diagnosis not present

## 2015-11-12 DIAGNOSIS — N184 Chronic kidney disease, stage 4 (severe): Secondary | ICD-10-CM

## 2015-11-12 DIAGNOSIS — Z23 Encounter for immunization: Secondary | ICD-10-CM

## 2015-11-12 DIAGNOSIS — E785 Hyperlipidemia, unspecified: Secondary | ICD-10-CM

## 2015-11-12 NOTE — Addendum Note (Signed)
Addended by: Marin Olp on: 11/12/2015 04:48 PM   Modules accepted: Orders, SmartSet

## 2015-11-12 NOTE — Progress Notes (Signed)
Subjective:  Patient ID: [name], female    DOB: [birth year]  Age: 69 y.o. MRN: [id number]  CC: Congestive Heart Failure; COPD; and Diabetes   HPI [name] presents for  follow-up of hypertension. Patient has no history of headache chest pain or shortness of breath or recent cough. Patient also denies symptoms of TIA such as numbness weakness lateralizing. Patient checks  blood pressure at home and has not had any elevated readings recently. Patient denies side effects from his medication. States taking it regularly.  Patient also  in for follow-up of elevated cholesterol. Doing well without complaints on current medication. Denies side effects of statin including myalgia and arthralgia and nausea. Also in today for liver function testing. Currently no chest pain, shortness of breath or other cardiovascular related symptoms noted.  Follow-up of diabetes. Patient does check blood sugar at home fasting, before lunch and at bedtime.. Readings run between 100 and 147 Patient denies symptoms such as polyuria, polydipsia, excessive hunger, nausea patient is trying to lose weight. She has been struggling with obesity. We discussed making sure she gets plenty of protein and limit carbs in order to prevent malnutrition. No significant hypoglycemic spells noted. Medications as noted below. Taking them regularly without complication/adverse reaction being reported today.    History [name] has a past medical history of Anemia; Arthritis; Asthma; Cataract; CHF (congestive heart failure) ([place]); Diabetes mellitus; Hyperlipidemia; Hypertension; CAD (coronary artery disease); Stroke [facility]); Oxygen dependent; Gait instability; GERD (gastroesophageal reflux disease); Obesity; Sleep apnea, obstructive (2008); Atrial fibrillation ([place]); and COPD (chronic obstructive pulmonary disease) ([place]).   She has past surgical history that includes Cataract extraction w/ intraocular lens  implant, bilateral;  Appendectomy; Cholecystectomy; Carpal tunnel release; Cervical spine surgery; Cesarean section; Umbilical hernia repair; Knee arthroscopy; Colonoscopy (11/22/2002); Upper gastrointestinal endoscopy (11/22/2002); Colonoscopy (11/26/2005); Femur IM nail (Left, 05/08/2013); Hip surgery (Left); and Eye surgery.   Her family history includes Alzheimer's disease in her father and mother; Asthma in her daughter and grandchild; Breast cancer in her sister; Cancer in her brother, mother, and sister; Colon cancer in her mother; Diabetes in her daughter, mother, and another family member; Heart attack in her mother, sister, and another family member; Heart disease in her brother, mother, and sister; Kidney disease in her mother; Migraines in her sister.She reports that she has never smoked. She has never used smokeless tobacco. She reports that she does not drink alcohol or use illicit drugs.  Current Outpatient Prescriptions on File Prior to Visit  Medication Sig Dispense Refill  . acetaminophen (TYLENOL) 325 MG tablet Take 2 tablets (650 mg total) by mouth every 6 (six) hours as needed for pain or fever.    Marland Kitchen alendronate (FOSAMAX) 70 MG tablet Take 1 tablet (70 mg total) by mouth every 7 (seven) days. 4 tablet 2  . Cholecalciferol ([facility] VITAMIN D3) 1000 UNITS CHEW Chew 1 tablet (1,000 Units total) by mouth daily. 30 tablet   . colchicine 0.6 MG tablet 1.2 mg then 0.6 mg one hour for pain 30 tablet 1  . diltiazem (CARDIZEM CD) 360 MG 24 hr capsule Take 1 capsule (360 mg total) by mouth daily. 30 capsule 5  . Elastic Bandages & Supports (JOBST RELIEF 30-40MMHG XL) MISC Wear daily for swelling in legs 2 each 11  . febuxostat (ULORIC) 40 MG tablet Take 1 tablet (40 mg total) by mouth daily. 90 tablet 1  . fexofenadine (ALLEGRA) 180 MG tablet Take 1 tablet (180 mg total) by mouth daily.  30 tablet 11  . fish oil-omega-3 fatty acids 1000 MG capsule Take 2 g by mouth daily.      . fluocinonide-emollient (LIDEX-E) 0.05 %  cream Apply to feet after soaking each night 60 g 5  . gabapentin (NEURONTIN) 100 MG capsule Take 1 capsule (100 mg total) by mouth 3 (three) times daily. 90 capsule 0  . HEMATINIC/FOLIC ACID 144-3 MG TABS TAKE 1 TABLET DAILY 30 each 0  . insulin aspart (NOVOLOG) 100 UNIT/ML injection Use as directed 3 vial PRN  . Insulin Detemir (LEVEMIR FLEXTOUCH) 100 UNIT/ML Pen Inject 50 Units into the skin 2 (two) times daily. 30 mL 0  . KLOR-CON M20 20 MEQ tablet Take 20 mEq by mouth daily.  4  . Lancets MISC     . metFORMIN (GLUCOPHAGE) 850 MG tablet Take 1 tablet (850 mg total) by mouth 2 (two) times daily. 60 tablet 5  . metolazone (ZAROXOLYN) 2.5 MG tablet 1 po on Sat and wednesday 30 tablet 1  . rosuvastatin (CRESTOR) 10 MG tablet TAKE 1 TABLET (10 MG TOTAL) BY MOUTH AT BEDTIME. 30 tablet 5  . torsemide (DEMADEX) 20 MG tablet Take 3 tablets (60 mg total) by mouth 2 (two) times daily. With breakfast and again around 2 to 3 PM 120 tablet 2  . warfarin (COUMADIN) 5 MG tablet TAKE 1 TABLET BY MOUTH EVERY EVENING EXCEPT MONDAY. NO DOSE MONDAY 45 tablet 2   No current facility-administered medications on file prior to visit.    ROS Review of Systems  Constitutional: Negative for fever, activity change and appetite change.  HENT: Negative for congestion, rhinorrhea and sore throat.   Eyes: Negative for visual disturbance.  Respiratory: Negative for cough and shortness of breath.   Cardiovascular: Negative for chest pain and palpitations.  Gastrointestinal: Negative for nausea, abdominal pain and diarrhea.  Genitourinary: Negative for dysuria.  Musculoskeletal: Positive for arthralgias (left foot psin from gout. Missed 2 doses of colchicine. Had severer pain. Relieved quickly after resuming med.). Negative for myalgias.  Psychiatric/Behavioral: Negative for suicidal ideas, self-injury and dysphoric mood. The patient is not nervous/anxious.        Denies feelings of depression    Objective:  BP  121/52 mmHg  Pulse 74  Temp(Src) 96.5 F (35.8 C) (Oral)  Ht 5' 6" (1.676 m)  Wt 280 lb 6.4 oz (127.189 kg)  BMI 45.28 kg/m2  SpO2 100%  BP Readings from Last 3 Encounters:  11/12/15 121/52  09/24/15 151/70  09/11/15 138/60    Wt Readings from Last 3 Encounters:  11/12/15 280 lb 6.4 oz (127.189 kg)  09/24/15 282 lb 6.4 oz (128.096 kg)  09/11/15 287 lb (130.182 kg)     Physical Exam  Constitutional: She is oriented to person, place, and time. She appears well-developed and well-nourished. No distress.  HENT:  Head: Normocephalic and atraumatic.  Right Ear: External ear normal.  Left Ear: External ear normal.  Mouth/Throat: Oropharynx is clear and moist.  Eyes: Conjunctivae and EOM are normal. Pupils are equal, round, and reactive to light.  Neck: Normal range of motion.  Cardiovascular: Normal rate and regular rhythm.   No murmur heard. Distal pulses faint, diminished at [facility] & PT bilaterally  Pulmonary/Chest: Effort normal. No respiratory distress (Breath sounds distant, wearing oxygen).  Abdominal: Soft. She exhibits no distension. There is no tenderness.  Musculoskeletal: Normal range of motion. Edema: 1+ at ankles (weight down 5 lb.)  Neurological: She is alert and oriented to person, place, and time.  Coordination (patient uses a walker due to poor strength. Has furniture arranged to help prevent falls.) abnormal.  Skin: Skin is warm and dry.  Soles of feet peeling with moderate scaling and callus around heel margin deep creases indicate ulceration pending   Psychiatric: She has a normal mood and affect. Her behavior is normal. Thought content normal.    Lab Results  Component Value Date   HGBA1C 6.6 09/24/2015   HGBA1C 6.8 06/25/2015   HGBA1C 6.7 03/22/2015    Lab Results  Component Value Date   WBC 8.2 06/25/2015   HGB 9.2* 06/25/2015   HCT 29.4* 06/25/2015   PLT 193 10/19/2014   GLUCOSE 147* 09/24/2015   CHOL 116 09/24/2015   TRIG 188* 09/24/2015   HDL  42 09/24/2015   LDLCALC 32 06/25/2015   ALT 14 09/24/2015   AST 14 09/24/2015   NA 143 09/24/2015   K 4.5 09/24/2015   CL 100 09/24/2015   CREATININE 1.24* 09/24/2015   BUN 26 09/24/2015   CO2 23 09/24/2015   TSH 2.960 03/22/2015   INR 2.3 08/09/2015   HGBA1C 6.6 09/24/2015    Nm Bone Scan Whole Body  10/10/2015  CLINICAL DATA:  Back pain.  Fall 2 years ago. EXAM: NUCLEAR MEDICINE WHOLE BODY BONE SCAN TECHNIQUE: Whole body anterior and posterior images were obtained approximately 3 hours after intravenous injection of radiopharmaceutical. RADIOPHARMACEUTICALS:  25.7 mCi Technetium-58mMDP IV COMPARISON:  CT chest 08/28/2015. Ultrasound kidneys 08/11/2015. Lumbar spine series 07/20/2015. FINDINGS: Right renal atrophy again noted. Bilateral renal function and excretion is present. No focal significant bony abnormalities identified. Minimal increase activity noted about the shoulders, both sternoclavicular joints, both knees, and about the ankle and feet consistent with degenerative change. IMPRESSION: Degenerative changes otherwise no significant abnormality. Electronically Signed   By: TMarcello Moores Register   On: 10/10/2015 13:04    Assessment & Plan:   MAlyssawas seen today for congestive heart failure, copd and diabetes.  Diagnoses and all orders for this visit:  Diabetic nephropathy associated with type 2 diabetes mellitus ([place] -     CBC with Differential/Platelet -     POCT glycosylated hemoglobin (Hb A1C) -     CMP14+EGFR -     Microalbumin / creatinine urine ratio -     POCT urinalysis dipstick  Chronic systolic congestive heart failure (HCC) -     CBC with Differential/Platelet -     CMP14+EGFR -     POCT urinalysis dipstick  Chronic respiratory failure with hypoxia (HCC) -     CBC with Differential/Platelet -     CMP14+EGFR -     POCT urinalysis dipstick  Chronic renal insufficiency, stage 4 (severe) (HCC) -     CBC with Differential/Platelet -     CMP14+EGFR -      Microalbumin / creatinine urine ratio -     POCT urinalysis dipstick  Morbid obesity, unspecified obesity type (HCC) -     CBC with Differential/Platelet -     CMP14+EGFR -     POCT urinalysis dipstick  PULMONARY HYPERTENSION -     CBC with Differential/Platelet -     CMP14+EGFR -     POCT urinalysis dipstick  Thrombocytopenia, unspecified (HCC) -     CBC with Differential/Platelet -     CMP14+EGFR -     POCT urinalysis dipstick  Hyperlipidemia -     Lipid panel -     POCT urinalysis dipstick  Chronic gout due to renal impairment  involving foot without tophus, unspecified laterality -     Uric acid   I am having Ms. [name] maintain her fish oil-omega-3 fatty acids, alendronate, acetaminophen, Lancets, gabapentin, metolazone, Cholecalciferol, HEMATINIC/FOLIC ACID, fexofenadine, JOBST RELIEF 30-40MMHG XL, Insulin Detemir, metFORMIN, warfarin, colchicine, febuxostat, insulin aspart, KLOR-CON M20, torsemide, fluocinonide-emollient, diltiazem, and rosuvastatin.  Ambulate in the homoe as much as possible. Regular diet with carb control, added protein   Follow-up: Return in about 1 month (around 12/13/2015) for coumadin check, # months, Diabetes check.  Claretta Fraise, M.D.

## 2015-11-14 ENCOUNTER — Other Ambulatory Visit: Payer: Self-pay | Admitting: Family Medicine

## 2015-11-15 ENCOUNTER — Other Ambulatory Visit: Payer: Self-pay | Admitting: Family

## 2015-11-19 ENCOUNTER — Encounter: Payer: Self-pay | Admitting: Pharmacist

## 2015-11-19 ENCOUNTER — Other Ambulatory Visit: Payer: Self-pay | Admitting: Pharmacist

## 2015-11-19 ENCOUNTER — Ambulatory Visit (INDEPENDENT_AMBULATORY_CARE_PROVIDER_SITE_OTHER): Payer: Commercial Managed Care - HMO | Admitting: Pharmacist

## 2015-11-19 VITALS — BP 138/60 | HR 64 | Ht 66.0 in | Wt 278.0 lb

## 2015-11-19 DIAGNOSIS — E119 Type 2 diabetes mellitus without complications: Secondary | ICD-10-CM

## 2015-11-19 DIAGNOSIS — Z1211 Encounter for screening for malignant neoplasm of colon: Secondary | ICD-10-CM

## 2015-11-19 DIAGNOSIS — Z0000 Encounter for general adult medical examination without abnormal findings: Secondary | ICD-10-CM

## 2015-11-19 DIAGNOSIS — M810 Age-related osteoporosis without current pathological fracture: Secondary | ICD-10-CM

## 2015-11-19 DIAGNOSIS — I4891 Unspecified atrial fibrillation: Secondary | ICD-10-CM | POA: Diagnosis not present

## 2015-11-19 DIAGNOSIS — Z794 Long term (current) use of insulin: Secondary | ICD-10-CM

## 2015-11-19 LAB — POCT INR: INR: 2.6

## 2015-11-19 MED ORDER — MELATONIN 3 MG PO TABS: 3.0000 mg | ORAL_TABLET | Freq: Every evening | ORAL | Status: DC | PRN

## 2015-11-19 NOTE — Progress Notes (Signed)
Patient ID: [name], female   DOB: 28-Jul-1946, 69 y.o.   MRN: [id number]    Subjective:   [name] is a 69 y.o. white female who presents for a subsequent Medicare Annual Wellness Visit and to recheck INR / protime.   Mrs. [name] lives by herself at [facility] apartments.  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 Patient is pleasant and cooperative.  Her only health concern today is that she does not sleep well.  She has never taken any medication for sleep.  Review of Systems  Review of Systems  Constitutional: Positive for weight loss (patient has been limiting fats and sweets) and diaphoresis (at night). Negative for fever, chills and malaise/fatigue.  HENT: Negative for congestion, ear discharge, ear pain, hearing loss, nosebleeds, sore throat and tinnitus.   Eyes: Negative.  Negative for blurred vision, double vision, photophobia, pain, discharge and redness.  Respiratory: Positive for shortness of breath (patient uses daily supplemental oxygen). Negative for cough, hemoptysis, sputum production, wheezing and stridor.   Cardiovascular: Positive for orthopnea, claudication and leg swelling. Negative for chest pain, palpitations and PND.  Gastrointestinal: Negative for heartburn, nausea, vomiting, abdominal pain, diarrhea, constipation, blood in stool and melena.  Genitourinary: Negative for dysuria, urgency, frequency, hematuria and flank pain.  Musculoskeletal: Positive for back pain and joint pain. Negative for myalgias, falls and neck pain.  Skin: Negative for itching and rash.  Neurological: Positive for tingling and weakness. Negative for dizziness, tremors, sensory change, speech change, focal weakness, seizures, loss of consciousness and headaches.  Endo/Heme/Allergies: Negative for environmental allergies and polydipsia. Bruises/bleeds easily.  Psychiatric/Behavioral: Negative for  depression, suicidal ideas, hallucinations, memory loss and substance abuse. The patient has insomnia. The patient is not nervous/anxious.      Current Medications (verified) Outpatient Encounter Prescriptions as of 11/19/2015  Medication Sig  . acetaminophen (TYLENOL) 325 MG tablet Take 2 tablets (650 mg total) by mouth every 6 (six) hours as needed for pain or fever.  Marland Kitchen alendronate (FOSAMAX) 70 MG tablet Take 1 tablet (70 mg total) by mouth every 7 (seven) days.  . Cholecalciferol ([facility]S VITAMIN D3) 1000 UNITS CHEW Chew 1 tablet (1,000 Units total) by mouth daily.  . colchicine 0.6 MG tablet 1.2 mg then 0.6 mg one hour for pain  . diltiazem (CARDIZEM CD) 360 MG 24 hr capsule Take 1 capsule (360 mg total) by mouth daily.  . Elastic Bandages & Supports (JOBST RELIEF 30-40MMHG XL) MISC Wear daily for swelling in legs  . febuxostat (ULORIC) 40 MG tablet Take 1 tablet (40 mg total) by mouth daily.  . fexofenadine (ALLEGRA) 180 MG tablet Take 1 tablet (180 mg total) by mouth daily.  . fish oil-omega-3 fatty acids 1000 MG capsule Take 2 g by mouth daily.    . fluocinonide-emollient (LIDEX-E) 0.05 % cream Apply to feet after soaking each night  . HEMATINIC/FOLIC ACID 99991111 MG TABS TAKE 1 TABLET DAILY  . KLOR-CON M20 20 MEQ tablet Take 20 mEq by mouth daily.  . Lancets MISC   . metFORMIN (GLUCOPHAGE) 850 MG tablet Take 1 tablet (850 mg total) by mouth 2 (two) times daily.  . rosuvastatin (CRESTOR) 10 MG tablet TAKE 1 TABLET (10 MG TOTAL) BY MOUTH AT BEDTIME.  Marland Kitchen torsemide (DEMADEX) 20 MG tablet Take 3 tablets (60 mg total) by mouth 2 (two) times daily. With breakfast and again around 2 to 3 PM  .  warfarin (COUMADIN) 5 MG tablet TAKE 1 TABLET BY MOUTH EVERY EVENING EXCEPT MONDAY. NO DOSE MONDAY  . gabapentin (NEURONTIN) 100 MG capsule Take 1 capsule (100 mg total) by mouth 3 (three) times daily. (Patient not taking: Reported on 11/19/2015)  . insulin aspart (NOVOLOG) 100 UNIT/ML injection Use as  directed (Patient not taking: Reported on 11/19/2015)  . Insulin Detemir (LEVEMIR FLEXTOUCH) 100 UNIT/ML Pen Inject 50 Units into the skin 2 (two) times daily. (Patient not taking: Reported on 11/19/2015)  . [DISCONTINUED] diltiazem (CARDIZEM CD) 180 MG 24 hr capsule TAKE ONE CAPSULE BY MOUTH DAILY (Patient not taking: Reported on 11/19/2015)  . [DISCONTINUED] lisinopril (PRINIVIL,ZESTRIL) 20 MG tablet TAKE 1 TABLET (20 MG TOTAL) BY MOUTH DAILY. (Patient not taking: Reported on 11/19/2015)  . [DISCONTINUED] metolazone (ZAROXOLYN) 2.5 MG tablet 1 po on Sat and wednesday (Patient not taking: Reported on 11/19/2015)   No facility-administered encounter medications on file as of 11/19/2015.   Allergies (verified) Codeine and Lisinopril   History: Past Medical History  Diagnosis Date  . Anemia   . Arthritis   . Asthma   . Cataract     had surgery  . CHF (congestive heart failure) ([place])   . Diabetes mellitus   . Hyperlipidemia   . Hypertension   . CAD (coronary artery disease)     LAD 60% proximal stenosis mid and distal 60% stenosis 2009.  . Stroke ([place])     2 mini  . Oxygen dependent     2 liter per nasal cannula  . Gait instability     uses cane  . GERD (gastroesophageal reflux disease)   . Obesity   . Sleep apnea, obstructive 2008  . Atrial fibrillation ([place])   . COPD (chronic obstructive pulmonary disease) [facility])    Past Surgical History  Procedure Laterality Date  . Cataract extraction w/ intraocular lens  implant, bilateral    . Appendectomy    . Cholecystectomy    . Carpal tunnel release      right hand  . Cervical spine surgery      fusion  . Cesarean section    . Umbilical hernia repair    . Knee arthroscopy      twice, left  . Colonoscopy  11/22/2002    normal - Dr.Rrourk  . Upper gastrointestinal endoscopy  11/22/2002    Normal - Dr. Gala Romney  . Colonoscopy  11/26/2005    incomplete with negative BE (Dr. Rowe Pavy, [place])  . Femur im nail Left 05/08/2013     Procedure: INTRAMEDULLARY (IM) NAIL FEMORAL--LONG(BIOMET SYSTEM) and Zimmer Cables;  Surgeon: Augustin Schooling, MD;  Location: [facility];  Service: Orthopedics;  Laterality: Left;  . Hip surgery Left   . Eye surgery     Family History  Problem Relation Age of Onset  . Colon cancer Mother   . Heart attack Mother   . Alzheimer's disease Mother     father  . Diabetes Mother   . Heart disease Mother   . Cancer Mother     colon  . Kidney disease Mother   . Diabetes    . Heart attack    . Cancer Sister   . Migraines Sister   . Alzheimer's disease Father   . Cancer Brother   . Diabetes Daughter   . Heart disease Sister   . Heart attack Sister   . Breast cancer Sister   . Heart disease Brother   . Asthma Daughter   . Asthma Grandchild  Social History   Occupational History  . Disabled    Social History Main Topics  . Smoking status: Never Smoker   . Smokeless tobacco: Never Used  . Alcohol Use: No  . Drug Use: No  . Sexual Activity: No    Do you feel safe at home?  Yes  Dietary issues and exercise activities: Current Exercise Habits:: Exercise is limited by, Limited by:: orthopedic condition(s);respiratory conditions(s)  Current Dietary habits:  Patient has decreased fat and sweets recently - she has lost about 11# since 07/2015   Objective:    Today's Vitals   11/19/15 1529  BP: 138/60  Pulse: 64  Height: 5\' 6"  (1.676 m)  Weight: 278 lb (126.1 kg)  PainSc: 0-No pain   Body mass index is 44.89 kg/(m^2).   INR was 2.6 today in office  Activities of Daily Living In your present state of health, do you have any difficulty performing the following activities: 11/19/2015  Hearing? N  Vision? N  Difficulty concentrating or making decisions? N  Walking or climbing stairs? Y  Dressing or bathing? Y  Doing errands, shopping? N  Preparing Food and eating ? N  Using the Toilet? N  In the past six months, have you accidently leaked urine? N  Do you have problems  with loss of bowel control? N  Managing your Medications? Y  Managing your Finances? Y  Housekeeping or managing your Housekeeping? N    Are there smokers in your home (other than you)? No   Cardiac Risk Factors include: advanced age (>96men, >54 women);diabetes mellitus;dyslipidemia;sedentary lifestyle;obesity (BMI >30kg/m2)  Depression Screen PHQ 2/9 Scores 11/19/2015 11/12/2015 08/15/2015 06/25/2015  PHQ - 2 Score 0 0 0 0  PHQ- 9 Score 2 - - -    Fall Risk Fall Risk  11/19/2015 11/12/2015 08/15/2015 06/25/2015 04/26/2015  Falls in the past year? No No No No No    Cognitive Function: MMSE - Mini Mental State Exam 11/19/2015  Orientation to time 5  Orientation to Place 5  Registration 3  Attention/ Calculation 4  Recall 2  Language- name 2 objects 2  Language- repeat 1  Language- follow 3 step command 3  Language- read & follow direction 1  Write a sentence 0  Copy design 1  Total score 27    Immunizations and Health Maintenance Immunization History  Administered Date(s) Administered  . Influenza,inj,Quad PF,36+ Mos 08/23/2013, 09/15/2014, 09/11/2015  . Pneumococcal Conjugate-13 09/15/2014  . Pneumococcal Polysaccharide-23 11/12/2015  . Tdap 03/09/2012  . Zoster 09/03/2012   There are no preventive care reminders to display for this patient.  Patient Care Team: Claretta Fraise, MD as PCP - General (Family Medicine) Santo Held, MD as Consulting Physician (Gynecology) Minus Breeding, MD as Consulting Physician (Cardiology)  Indicate any recent Medical Services you may have received from other than [facility] providers in the past year (date may be approximate).    Assessment:    Annual Wellness Visit  Therapeutic anticoagulation Insomnia Medication Management - medication interface noted that diltiazem 180 and lisinopril 20mg  [facility] although these medications has been discontinued - patient states that she picked them up but did not take  them.  Obesity - patient has made dietary changes that have resulted in weight loss Microalbuminuria - patient previously on lisinopril but 08/2015 serum creatine and potassium increased.  Returned to normal once lisinopril was D/C.  She has not taken lisinopril since 08/2015   Screening Tests Health Maintenance  Topic Date  Due  . Hepatitis C Screening  11/21/2015 (Originally [birth year])  . OPHTHALMOLOGY EXAM  02/10/2016 (Originally 02/08/1956)  . COLONOSCOPY  11/27/2015  . HEMOGLOBIN A1C  03/24/2016  . INFLUENZA VACCINE  07/01/2016  . FOOT EXAM  09/23/2016  . MAMMOGRAM  10/30/2016  . DEXA SCAN  02/20/2017  . TETANUS/TDAP  03/09/2022  . ZOSTAVAX  Completed  . PNA vac Low Risk Adult  Completed      Plan:   During the course of the visit Elexa was educated and counseled about the following appropriate screening and preventive services:   Vaccines to include Pneumoccal, Influenza, Td, Zostavax -  Patient is UTD on all vaccines  Colorectal cancer screening - patient declines to have another colonoscopy but FOBT was given today.   Cardiovascular disease screening - sees Dr Jenkins Rouge regularly.  EKG is UTD.  BP was at goal today.    Lipids - LFL at goal but Tg elevated at last check  Diabetes - last A1c was 6.6%.  Patient advised to continue to check BG up to bid. Reminded about BG goals - fasting 80-130 and postprandial less than 180.  She is to call office for restart of insulin is she gets values outside of these goals.  Continue metformin 850mg  BID.  Continue to monitor serum creatinine.   Bone Denisty / Osteoporosis Screening - continue alendronate - next DEXA 01/2017  Mammogram - UTD  Glaucoma screening / Diabetic Eye Exam - needed - patient agrees to referral to Dr Marin Comment   Nutrition counseling - continue to limit high fat and high CHO foods.  Encouraged increase in high fiber foods and lean proteins.   Advanced Directives - encouraged patient to complete paperwork that she  already has at home and return to office.   Discussed chair exercises and handout given.   [facility] and asked [facility] for Diltiazem 180mg  and lisinopril 20mg  as patient is no longer taking these medications.    Microalbuminuria - continue to keep BG and BP at goals.  Due to history of renal failure with ACE-I will not recommend restart at this time.    Try melatonin 3mg  prior to bedtime for sleep / insomnia.    Patient Instructions (the written plan) were given to the patient.   Cherre Robins, [place], CPP, CDE  11/19/2015

## 2015-11-19 NOTE — Patient Instructions (Addendum)
  [name] , Thank you for taking time to come for your Medicare Wellness Visit. I appreciate your ongoing commitment to your health goals. Please review the following plan we discussed and let me know if I can assist you in the future.   These are the goals we discussed:  Continue to limit high fat and high sugar / carbohydrate containing foods - potatoes, rice, pasta, cakes, cookies, ice cream, crackers and chips) Increase lean proteins such as fish, chicken, nuts and [place] and non starchy vegetables - such as carrots, green bean, squash, zucchini, tomatoes, onions, peppers, spinach and other green leafy vegetables, cabbage, lettuce, cucumbers, asparagus, okra (not fried), eggplant  Increase fresh fruit but limit serving sizes 1/2 cup or about the size of tennis or baseball Choose whole grains - such whole wheat bread, quinoa, whole grain rice (1/2 cup)    This is a list of the screening recommended for you and due dates:  Health Maintenance  Topic Date Due  .  Hepatitis C: One time screening is recommended by Center for Disease Control  (CDC) for  adults born from [birth year] through 1967.   11/21/2015*  . Eye exam for diabetics  02/10/2016*  . Colon Cancer Screening  11/27/2015  . Hemoglobin A1C  03/24/2016  . Flu Shot  07/01/2016  . Complete foot exam   09/23/2016  . Mammogram  10/30/2016  . DEXA scan (bone density measurement)  02/20/2017  . Tetanus Vaccine  03/09/2022  . Shingles Vaccine  Completed  . Pneumonia vaccines  Completed  *Topic was postponed. The date shown is not the original due date.    Anticoagulation Dose Instructions as of 11/19/2015      Dorene Grebe Tue Wed Thu Fri Sat   New Dose 2.5 mg 5 mg 5 mg 5 mg 2.5 mg 5 mg 5 mg    Description        Continue current warfarin dose of 5mg  1/2 sundays and thursdays and 1 tablet all other days.      INR was 2.6 today

## 2015-11-20 ENCOUNTER — Other Ambulatory Visit: Payer: Self-pay | Admitting: Family

## 2015-11-20 ENCOUNTER — Telehealth: Payer: Self-pay | Admitting: Family Medicine

## 2015-11-20 DIAGNOSIS — M109 Gout, unspecified: Secondary | ICD-10-CM

## 2015-11-20 DIAGNOSIS — E1121 Type 2 diabetes mellitus with diabetic nephropathy: Secondary | ICD-10-CM

## 2015-11-20 DIAGNOSIS — N184 Chronic kidney disease, stage 4 (severe): Secondary | ICD-10-CM

## 2015-11-20 MED ORDER — FEBUXOSTAT 40 MG PO TABS: 40.0000 mg | ORAL_TABLET | Freq: Every day | ORAL | Status: DC

## 2015-11-21 ENCOUNTER — Other Ambulatory Visit: Payer: Self-pay | Admitting: Family

## 2015-11-21 MED ORDER — ALLOPURINOL 100 MG PO TABS: 100.0000 mg | ORAL_TABLET | Freq: Every day | ORAL | Status: DC

## 2015-11-21 NOTE — Telephone Encounter (Signed)
Uloric d/c and allopurinol Prescription sent to pharmacy

## 2015-11-29 ENCOUNTER — Ambulatory Visit: Payer: Self-pay | Admitting: Pharmacist

## 2015-12-10 ENCOUNTER — Institutional Professional Consult (permissible substitution): Payer: Medicare HMO | Admitting: Pulmonary Disease

## 2015-12-14 ENCOUNTER — Ambulatory Visit: Payer: Commercial Managed Care - HMO | Admitting: Family Medicine

## 2015-12-21 ENCOUNTER — Ambulatory Visit (INDEPENDENT_AMBULATORY_CARE_PROVIDER_SITE_OTHER): Payer: Medicare Other | Admitting: Family Medicine

## 2015-12-21 ENCOUNTER — Encounter: Payer: Self-pay | Admitting: Family Medicine

## 2015-12-21 VITALS — BP 119/59 | HR 73 | Temp 97.1°F | Ht 66.0 in | Wt 282.0 lb

## 2015-12-21 DIAGNOSIS — Z794 Long term (current) use of insulin: Secondary | ICD-10-CM

## 2015-12-21 DIAGNOSIS — N183 Chronic kidney disease, stage 3 unspecified: Secondary | ICD-10-CM

## 2015-12-21 DIAGNOSIS — E119 Type 2 diabetes mellitus without complications: Secondary | ICD-10-CM | POA: Diagnosis not present

## 2015-12-21 DIAGNOSIS — J9611 Chronic respiratory failure with hypoxia: Secondary | ICD-10-CM | POA: Diagnosis not present

## 2015-12-21 DIAGNOSIS — I482 Chronic atrial fibrillation, unspecified: Secondary | ICD-10-CM

## 2015-12-21 LAB — POCT INR: INR: 2.8

## 2015-12-21 LAB — POCT GLYCOSYLATED HEMOGLOBIN (HGB A1C): Hemoglobin A1C: 7

## 2015-12-21 NOTE — Progress Notes (Signed)
Subjective:  Patient ID: [name], female    DOB: [birth year]  Age: 70 y.o. MRN: [id number]  CC: Diabetes and Atrial Fibrillation   HPI [name] presents for  Patient in for follow-up of atrial fibrillation. Patient denies any recent bouts of chest pain or palpitations. Additionally, patient is taking anticoagulants. Patient denies any recent excessive bleeding episodes including epistaxis, bleeding from the gums, genitalia, rectal bleeding or hematuria. Additionally there has been no excessive bruising.  Follow-up of diabetes. Patient does not check blood sugar at home Patient denies symptoms such as polyuria, polydipsia, excessive hunger, nausea No significant hypoglycemic spells noted. Medications as noted below. Taking them regularly without complication/adverse reaction being reported today.  Continues to wear oxygen regularly for chronic respiratory failure. Breathing is at her baseline. Overall feels that her breathing is stable and comfortable. History [name] has a past medical history of Anemia; Arthritis; Asthma; Cataract; CHF (congestive heart failure) ([place]); Diabetes mellitus; Hyperlipidemia; Hypertension; CAD (coronary artery disease); Stroke [facility]); Oxygen dependent; Gait instability; GERD (gastroesophageal reflux disease); Obesity; Sleep apnea, obstructive (2008); Atrial fibrillation ([place]); and COPD (chronic obstructive pulmonary disease) ([place]).   She has past surgical history that includes Cataract extraction w/ intraocular lens  implant, bilateral; Appendectomy; Cholecystectomy; Carpal tunnel release; Cervical spine surgery; Cesarean section; Umbilical hernia repair; Knee arthroscopy; Colonoscopy (11/22/2002); Upper gastrointestinal endoscopy (11/22/2002); Colonoscopy (11/26/2005); Femur IM nail (Left, 05/08/2013); Hip surgery (Left); and Eye surgery.   Her family history includes Alzheimer's disease in her father and mother; Asthma in her daughter and grandchild;  Breast cancer in her sister; Cancer in her brother, mother, and sister; Colon cancer in her mother; Diabetes in her daughter and mother; Heart attack in her mother and sister; Heart disease in her brother, mother, and sister; Kidney disease in her mother; Migraines in her sister.She reports that she has never smoked. She has never used smokeless tobacco. She reports that she does not drink alcohol or use illicit drugs.  Outpatient Prescriptions Prior to Visit  Medication Sig Dispense Refill  . acetaminophen (TYLENOL) 325 MG tablet Take 2 tablets (650 mg total) by mouth every 6 (six) hours as needed for pain or fever.    Marland Kitchen alendronate (FOSAMAX) 70 MG tablet Take 1 tablet (70 mg total) by mouth every 7 (seven) days. 4 tablet 2  . allopurinol (ZYLOPRIM) 100 MG tablet Take 1 tablet (100 mg total) by mouth daily. 90 tablet 1  . Cholecalciferol ([employer] VITAMIN D3) 1000 UNITS CHEW Chew 1 tablet (1,000 Units total) by mouth daily. 30 tablet   . colchicine 0.6 MG tablet 1.2 mg then 0.6 mg one hour for pain 30 tablet 1  . diltiazem (CARDIZEM CD) 360 MG 24 hr capsule Take 1 capsule (360 mg total) by mouth daily. 30 capsule 5  . Elastic Bandages & Supports (JOBST RELIEF 30-40MMHG XL) MISC Wear daily for swelling in legs 2 each 11  . fexofenadine (ALLEGRA) 180 MG tablet Take 1 tablet (180 mg total) by mouth daily. 30 tablet 11  . fish oil-omega-3 fatty acids 1000 MG capsule Take 2 g by mouth daily.      . fluocinonide-emollient (LIDEX-E) 0.05 % cream Apply to feet after soaking each night 60 g 5  . gabapentin (NEURONTIN) 100 MG capsule Take 1 capsule (100 mg total) by mouth 3 (three) times daily. 90 capsule 0  . HEMATINIC/FOLIC ACID 989-2 MG TABS TAKE 1 TABLET DAILY 30 each 0  . insulin aspart (NOVOLOG) 100 UNIT/ML injection Use as directed  3 vial PRN  . Insulin Detemir (LEVEMIR FLEXTOUCH) 100 UNIT/ML Pen Inject 50 Units into the skin 2 (two) times daily. 30 mL 0  . KLOR-CON M20 20 MEQ tablet Take 20 mEq by mouth  daily.  4  . Lancets MISC     . Melatonin 3 MG TABS Take 1 tablet (3 mg total) by mouth at bedtime as needed.  0  . metFORMIN (GLUCOPHAGE) 850 MG tablet Take 1 tablet (850 mg total) by mouth 2 (two) times daily. 60 tablet 5  . rosuvastatin (CRESTOR) 10 MG tablet TAKE 1 TABLET (10 MG TOTAL) BY MOUTH AT BEDTIME. 30 tablet 5  . torsemide (DEMADEX) 20 MG tablet Take 3 tablets (60 mg total) by mouth 2 (two) times daily. With breakfast and again around 2 to 3 PM 120 tablet 2  . warfarin (COUMADIN) 5 MG tablet TAKE 1 TABLET BY MOUTH EVERY EVENING EXCEPT MONDAY. NO DOSE MONDAY 45 tablet 2   No facility-administered medications prior to visit.    ROS Review of Systems  Constitutional: Negative for fever, activity change and appetite change.  HENT: Negative for congestion, rhinorrhea and sore throat.   Eyes: Negative for visual disturbance.  Respiratory: Positive for shortness of breath. Negative for cough.   Cardiovascular: Negative for chest pain and palpitations.  Gastrointestinal: Negative for nausea, abdominal pain and diarrhea.  Genitourinary: Negative for dysuria.  Musculoskeletal: Negative for myalgias and arthralgias.    Objective:  BP 119/59 mmHg  Pulse 73  Temp(Src) 97.1 F (36.2 C) (Oral)  Ht 5' 6"  (1.676 m)  Wt 282 lb (127.914 kg)  BMI 45.54 kg/m2  SpO2 97%  BP Readings from Last 3 Encounters:  12/21/15 119/59  11/19/15 138/60  11/12/15 121/52    Wt Readings from Last 3 Encounters:  12/21/15 282 lb (127.914 kg)  11/19/15 278 lb (126.1 kg)  11/12/15 280 lb 6.4 oz (127.189 kg)     Physical Exam  Constitutional: She is oriented to person, place, and time. She appears well-developed and well-nourished. No distress.  HENT:  Head: Normocephalic and atraumatic.  Right Ear: External ear normal.  Left Ear: External ear normal.  Nose: Nose normal.  Mouth/Throat: Oropharynx is clear and moist.  Eyes: Conjunctivae and EOM are normal. Pupils are equal, round, and reactive  to light.  Neck: Normal range of motion. Neck supple. No thyromegaly present.  Cardiovascular: Normal rate, regular rhythm and normal heart sounds.   No murmur heard. Pulmonary/Chest: Effort normal. No respiratory distress. She has wheezes. She has no rales.  Abdominal: Soft. Bowel sounds are normal. She exhibits no distension. There is no tenderness.  Lymphadenopathy:    She has no cervical adenopathy.  Neurological: She is alert and oriented to person, place, and time. She has normal reflexes.  Skin: Skin is warm and dry.  Psychiatric: She has a normal mood and affect. Her behavior is normal. Judgment and thought content normal.     Lab Results  Component Value Date   WBC 7.4 12/21/2015   HGB 9.2* 06/25/2015   HCT 29.5* 12/21/2015   PLT 199 12/21/2015   GLUCOSE 135* 12/21/2015   CHOL 101 12/21/2015   TRIG 113 12/21/2015   HDL 43 12/21/2015   LDLCALC 35 12/21/2015   ALT 10 12/21/2015   AST 18 12/21/2015   NA 144 12/21/2015   K 4.9 12/21/2015   CL 103 12/21/2015   CREATININE 1.16* 12/21/2015   BUN 17 12/21/2015   CO2 27 12/21/2015   TSH 2.960 03/22/2015  INR 2.8 12/21/2015   HGBA1C 7.0 12/21/2015    Nm Bone Scan Whole Body  10/10/2015  CLINICAL DATA:  Back pain.  Fall 2 years ago. EXAM: NUCLEAR MEDICINE WHOLE BODY BONE SCAN TECHNIQUE: Whole body anterior and posterior images were obtained approximately 3 hours after intravenous injection of radiopharmaceutical. RADIOPHARMACEUTICALS:  25.7 mCi Technetium-43mMDP IV COMPARISON:  CT chest 08/28/2015. Ultrasound kidneys 08/11/2015. Lumbar spine series 07/20/2015. FINDINGS: Right renal atrophy again noted. Bilateral renal function and excretion is present. No focal significant bony abnormalities identified. Minimal increase activity noted about the shoulders, both sternoclavicular joints, both knees, and about the ankle and feet consistent with degenerative change. IMPRESSION: Degenerative changes otherwise no significant  abnormality. Electronically Signed   By: TMarcello Moores Register   On: 10/10/2015 13:04    Assessment & Plan:   MMiyahwas seen today for diabetes and atrial fibrillation.  Diagnoses and all orders for this visit:  Chronic respiratory failure with hypoxia (HCC) -     POCT glycosylated hemoglobin (Hb A1C) -     CMP14+EGFR -     Lipid panel -     CBC with Differential/Platelet  Atrial fibrillation (HCC) -     POCT INR -     POCT glycosylated hemoglobin (Hb A1C) -     CMP14+EGFR -     Lipid panel -     CBC with Differential/Platelet  Long-term current use of insulin for diabetes mellitus (HCC) -     POCT glycosylated hemoglobin (Hb A1C) -     CMP14+EGFR -     Lipid panel -     CBC with Differential/Platelet  Chronic renal insufficiency, stage 3 (moderate) -     POCT glycosylated hemoglobin (Hb A1C) -     CMP14+EGFR -     Lipid panel -     CBC with Differential/Platelet   I am having Ms. Tieszen maintain her fish oil-omega-3 fatty acids, alendronate, acetaminophen, Lancets, gabapentin, Cholecalciferol, HEMATINIC/FOLIC ACID, fexofenadine, JOBST RELIEF 30-40MMHG XL, Insulin Detemir, metFORMIN, colchicine, insulin aspart, KLOR-CON M20, torsemide, fluocinonide-emollient, diltiazem, rosuvastatin, warfarin, Melatonin, and allopurinol.  No orders of the defined types were placed in this encounter.     Follow-up: No Follow-up on file.  WClaretta Fraise M.D.

## 2015-12-22 LAB — LIPID PANEL
CHOL/HDL RATIO: 2.3 ratio (ref 0.0–4.4)
Cholesterol, Total: 101 mg/dL (ref 100–199)
HDL: 43 mg/dL (ref >39)
LDL CALC: 35 mg/dL (ref 0–99)
TRIGLYCERIDES: 113 mg/dL (ref 0–149)
VLDL CHOLESTEROL CAL: 23 mg/dL (ref 5–40)

## 2015-12-22 LAB — CBC WITH DIFFERENTIAL/PLATELET
BASOS ABS: 0 10*3/uL (ref 0.0–0.2)
Basos: 0 %
EOS (ABSOLUTE): 0.2 10*3/uL (ref 0.0–0.4)
Eos: 2 %
Hematocrit: 29.5 % — ABNORMAL LOW (ref 34.0–46.6)
Hemoglobin: 9.4 g/dL — ABNORMAL LOW (ref 11.1–15.9)
IMMATURE GRANS (ABS): 0 10*3/uL (ref 0.0–0.1)
Immature Granulocytes: 0 %
LYMPHS ABS: 1.3 10*3/uL (ref 0.7–3.1)
Lymphs: 17 %
MCH: 28.3 pg (ref 26.6–33.0)
MCHC: 31.9 g/dL (ref 31.5–35.7)
MCV: 89 fL (ref 79–97)
MONOCYTES: 7 %
MONOS ABS: 0.5 10*3/uL (ref 0.1–0.9)
NEUTROS ABS: 5.5 10*3/uL (ref 1.4–7.0)
Neutrophils: 74 %
PLATELETS: 199 10*3/uL (ref 150–379)
RBC: 3.32 x10E6/uL — ABNORMAL LOW (ref 3.77–5.28)
RDW: 16.3 % — ABNORMAL HIGH (ref 12.3–15.4)
WBC: 7.4 10*3/uL (ref 3.4–10.8)

## 2015-12-22 LAB — CMP14+EGFR
ALT: 10 IU/L (ref 0–32)
AST: 18 IU/L (ref 0–40)
Albumin/Globulin Ratio: 1.7 (ref 1.1–2.5)
Albumin: 4 g/dL (ref 3.5–4.8)
Alkaline Phosphatase: 113 IU/L (ref 39–117)
BUN/Creatinine Ratio: 15 (ref 11–26)
BUN: 17 mg/dL (ref 8–27)
Bilirubin Total: 0.4 mg/dL (ref 0.0–1.2)
CO2: 27 mmol/L (ref 18–29)
CREATININE: 1.16 mg/dL — AB (ref 0.57–1.00)
Calcium: 9.2 mg/dL (ref 8.7–10.3)
Chloride: 103 mmol/L (ref 96–106)
GFR, EST AFRICAN AMERICAN: 55 mL/min/{1.73_m2} — AB (ref >59)
GFR, EST NON AFRICAN AMERICAN: 47 mL/min/{1.73_m2} — AB (ref >59)
Globulin, Total: 2.4 g/dL (ref 1.5–4.5)
Glucose: 135 mg/dL — ABNORMAL HIGH (ref 65–99)
Potassium: 4.9 mmol/L (ref 3.5–5.2)
Sodium: 144 mmol/L (ref 134–144)
TOTAL PROTEIN: 6.4 g/dL (ref 6.0–8.5)

## 2016-01-02 ENCOUNTER — Other Ambulatory Visit: Payer: Self-pay | Admitting: *Deleted

## 2016-01-02 DIAGNOSIS — R799 Abnormal finding of blood chemistry, unspecified: Secondary | ICD-10-CM

## 2016-01-03 ENCOUNTER — Other Ambulatory Visit: Payer: Commercial Managed Care - HMO

## 2016-01-03 DIAGNOSIS — R799 Abnormal finding of blood chemistry, unspecified: Secondary | ICD-10-CM

## 2016-01-04 LAB — RETICULOCYTES: RETIC CT PCT: 2.3 % (ref 0.6–2.6)

## 2016-01-04 LAB — ERYTHROPOIETIN: Erythropoietin: 52.5 m[IU]/mL — ABNORMAL HIGH (ref 2.6–18.5)

## 2016-01-11 DIAGNOSIS — J449 Chronic obstructive pulmonary disease, unspecified: Secondary | ICD-10-CM | POA: Diagnosis not present

## 2016-01-13 ENCOUNTER — Other Ambulatory Visit: Payer: Self-pay | Admitting: Family Medicine

## 2016-01-18 ENCOUNTER — Other Ambulatory Visit: Payer: Self-pay | Admitting: *Deleted

## 2016-01-18 DIAGNOSIS — Z1211 Encounter for screening for malignant neoplasm of colon: Secondary | ICD-10-CM

## 2016-01-21 ENCOUNTER — Ambulatory Visit (INDEPENDENT_AMBULATORY_CARE_PROVIDER_SITE_OTHER): Payer: Commercial Managed Care - HMO | Admitting: Pharmacist

## 2016-01-21 DIAGNOSIS — I482 Chronic atrial fibrillation, unspecified: Secondary | ICD-10-CM

## 2016-01-21 DIAGNOSIS — I4891 Unspecified atrial fibrillation: Secondary | ICD-10-CM

## 2016-01-21 LAB — POCT INR: INR: 2.2

## 2016-01-21 MED ORDER — DILTIAZEM HCL ER COATED BEADS 180 MG PO CP24: 180.0000 mg | ORAL_CAPSULE | Freq: Every day | ORAL | Status: DC

## 2016-01-21 NOTE — Patient Instructions (Signed)
Anticoagulation Dose Instructions as of 01/21/2016      Dorene Grebe Tue Wed Thu Fri Sat   New Dose 2.5 mg 5 mg 5 mg 5 mg 2.5 mg 5 mg 5 mg    Description        Continue current warfarin dose of 5mg  1/2 sundays and thursdays and 1 tablet all other days.      INR was 2.2 today

## 2016-01-25 ENCOUNTER — Telehealth: Payer: Self-pay | Admitting: Family Medicine

## 2016-02-08 DIAGNOSIS — J449 Chronic obstructive pulmonary disease, unspecified: Secondary | ICD-10-CM | POA: Diagnosis not present

## 2016-02-12 ENCOUNTER — Telehealth: Payer: Self-pay | Admitting: Family Medicine

## 2016-02-12 NOTE — Telephone Encounter (Signed)
done

## 2016-02-13 ENCOUNTER — Telehealth: Payer: Self-pay | Admitting: Family Medicine

## 2016-02-13 NOTE — Telephone Encounter (Signed)
I have completed it and turned it over to staff. Perhaps Florentina Jenny knows wher it is.

## 2016-02-13 NOTE — Telephone Encounter (Signed)
Patients dsughter is calling to check on form for [employer] and she needs to turn the form in today.

## 2016-02-16 ENCOUNTER — Other Ambulatory Visit: Payer: Self-pay | Admitting: Family Medicine

## 2016-02-20 ENCOUNTER — Ambulatory Visit (INDEPENDENT_AMBULATORY_CARE_PROVIDER_SITE_OTHER): Payer: Commercial Managed Care - HMO | Admitting: Family Medicine

## 2016-02-20 ENCOUNTER — Encounter: Payer: Self-pay | Admitting: Pharmacist

## 2016-02-20 ENCOUNTER — Ambulatory Visit (INDEPENDENT_AMBULATORY_CARE_PROVIDER_SITE_OTHER): Payer: Commercial Managed Care - HMO | Admitting: Pharmacist

## 2016-02-20 ENCOUNTER — Encounter: Payer: Self-pay | Admitting: Family Medicine

## 2016-02-20 VITALS — BP 122/70 | HR 76 | Temp 97.5°F | Ht 66.0 in | Wt 274.4 lb

## 2016-02-20 DIAGNOSIS — I482 Chronic atrial fibrillation, unspecified: Secondary | ICD-10-CM

## 2016-02-20 DIAGNOSIS — H66002 Acute suppurative otitis media without spontaneous rupture of ear drum, left ear: Secondary | ICD-10-CM | POA: Diagnosis not present

## 2016-02-20 LAB — COAGUCHEK XS/INR WAIVED
INR: 2.4 — AB (ref 0.9–1.1)
PROTHROMBIN TIME: 29.3 s

## 2016-02-20 MED ORDER — FLUTICASONE PROPIONATE 50 MCG/ACT NA SUSP: 1.0000 | Freq: Two times a day (BID) | NASAL | Status: DC | PRN

## 2016-02-20 MED ORDER — DOXYCYCLINE MONOHYDRATE 100 MG PO TABS: 100.0000 mg | ORAL_TABLET | Freq: Two times a day (BID) | ORAL | Status: DC

## 2016-02-20 NOTE — Progress Notes (Signed)
No charge - patient to RTC tonight for ear pain and fullness - possible ear infection

## 2016-02-20 NOTE — Patient Instructions (Signed)
Anticoagulation Dose Instructions as of 02/20/2016      Dorene Grebe Tue Wed Thu Fri Sat   New Dose 2.5 mg 5 mg 5 mg 5 mg 2.5 mg 5 mg 5 mg    Description        Continue current warfarin dose of 5mg  1/2 sundays and thursdays and 1 tablet all other days.      INR was 2.4 today

## 2016-02-20 NOTE — Progress Notes (Signed)
BP 122/70 mmHg  Pulse 76  Temp(Src) 97.5 F (36.4 C) (Oral)  Ht 5\' 6"  (1.676 m)  Wt 274 lb 6.4 oz (124.467 kg)  BMI 44.31 kg/m2   Subjective:    Patient ID: [name], female    DOB: 1946/05/14, 70 y.o.   MRN: [id number]  HPI: [name] is a 70 y.o. female presenting on 02/20/2016 for Left ear pain   HPI Left ear pain Patient presents today with left ear pain and congestion that has been going on for the last 5 days. She denies any fevers or chills. She is having some nasal congestion and sinus congestion as well. She denies any shortness of breath or wheezing. She denies any sore throat or cough as well. She says it is mostly just in her left ear. She feels like something has to pop because of the pressure that in there. She is not using any home medications for this yet.  Relevant past medical, surgical, family and social history reviewed and updated as indicated. Interim medical history since our last visit reviewed. Allergies and medications reviewed and updated.  Review of Systems  Constitutional: Negative for fever and chills.  HENT: Positive for ear pain and sinus pressure. Negative for congestion, ear discharge, postnasal drip, rhinorrhea, sneezing and sore throat.   Eyes: Negative for pain, redness and visual disturbance.  Respiratory: Negative for cough, chest tightness and shortness of breath.   Cardiovascular: Negative for chest pain and leg swelling.  Genitourinary: Negative for dysuria and difficulty urinating.  Musculoskeletal: Negative for back pain and gait problem.  Skin: Negative for rash.  Neurological: Negative for light-headedness and headaches.  Psychiatric/Behavioral: Negative for behavioral problems and agitation.  All other systems reviewed and are negative.   Per HPI unless specifically indicated above     Medication List       This list is accurate as of: 02/20/16  7:16 PM.  Always use your most recent med list.             acetaminophen 325 MG tablet  Commonly known as:  TYLENOL  Take 2 tablets (650 mg total) by mouth every 6 (six) hours as needed for pain or fever.     alendronate 70 MG tablet  Commonly known as:  FOSAMAX  Take 1 tablet (70 mg total) by mouth every 7 (seven) days.     allopurinol 100 MG tablet  Commonly known as:  ZYLOPRIM  Take 1 tablet (100 mg total) by mouth daily.     Cholecalciferol 1000 units Chew  Commonly known as:  [employer]S VITAMIN D3  Chew 1 tablet (1,000 Units total) by mouth daily.     colchicine 0.6 MG tablet  1.2 mg then 0.6 mg one hour for pain     diltiazem 180 MG 24 hr capsule  Commonly known as:  CARDIZEM CD  Take 1 capsule (180 mg total) by mouth daily.     doxycycline 100 MG tablet  Commonly known as:  ADOXA  Take 1 tablet (100 mg total) by mouth 2 (two) times daily.     fexofenadine 180 MG tablet  Commonly known as:  ALLEGRA  Take 1 tablet (180 mg total) by mouth daily.     fish oil-omega-3 fatty acids 1000 MG capsule  Take 2 g by mouth daily.     fluocinonide-emollient 0.05 % cream  Commonly known as:  LIDEX-E  Apply to feet after soaking each night     gabapentin 100 MG  capsule  Commonly known as:  NEURONTIN  Take 1 capsule (100 mg total) by mouth 3 (three) times daily.     HEMATINIC/FOLIC ACID 99991111 MG Tabs  Generic drug:  Ferrous Fumarate-Folic Acid  TAKE 1 TABLET DAILY     insulin aspart 100 UNIT/ML injection  Commonly known as:  novoLOG  Use as directed     Insulin Detemir 100 UNIT/ML Pen  Commonly known as:  LEVEMIR FLEXTOUCH  Inject 50 Units into the skin 2 (two) times daily.     JOBST RELIEF 30-40MMHG XL Misc  Wear daily for swelling in legs     KLOR-CON M20 20 MEQ tablet  Generic drug:  potassium chloride SA  Take 20 mEq by mouth daily. Reported on 02/20/2016     Lancets Misc     Melatonin 3 MG Tabs  Take 1 tablet (3 mg total) by mouth at bedtime as needed.     metFORMIN 850 MG tablet  Commonly known as:  GLUCOPHAGE  TAKE  1 TABLET (850 MG TOTAL) BY MOUTH 2 (TWO) TIMES DAILY.     rosuvastatin 10 MG tablet  Commonly known as:  CRESTOR  TAKE 1 TABLET (10 MG TOTAL) BY MOUTH AT BEDTIME.     torsemide 20 MG tablet  Commonly known as:  DEMADEX  TAKE 3 TABLETS (60 MG TOTAL) BY MOUTH 2 (TWO) TIMES DAILY. WITH BREAKFAST AND AGAIN AROUND 2 TO 3 PM     ULORIC 40 MG tablet  Generic drug:  febuxostat  Take 1 tablet by mouth daily.     warfarin 5 MG tablet  Commonly known as:  COUMADIN  Take 1/2 to 1 tab po qd.           Objective:    BP 122/70 mmHg  Pulse 76  Temp(Src) 97.5 F (36.4 C) (Oral)  Ht 5\' 6"  (1.676 m)  Wt 274 lb 6.4 oz (124.467 kg)  BMI 44.31 kg/m2  Wt Readings from Last 3 Encounters:  02/20/16 274 lb 6.4 oz (124.467 kg)  12/21/15 282 lb (127.914 kg)  11/19/15 278 lb (126.1 kg)    Physical Exam  Constitutional: She is oriented to person, place, and time. She appears well-developed and well-nourished. No distress.  HENT:  Right Ear: Tympanic membrane, external ear and ear canal normal.  Left Ear: External ear and ear canal normal. No mastoid tenderness. Tympanic membrane is injected, erythematous and retracted. Tympanic membrane is not scarred, not perforated and not bulging. A middle ear effusion is present.  Nose: Mucosal edema and rhinorrhea present. No epistaxis. Right sinus exhibits no maxillary sinus tenderness and no frontal sinus tenderness. Left sinus exhibits no maxillary sinus tenderness and no frontal sinus tenderness.  Mouth/Throat: Uvula is midline and mucous membranes are normal. Posterior oropharyngeal edema and posterior oropharyngeal erythema present. No oropharyngeal exudate or tonsillar abscesses.  Eyes: Conjunctivae and EOM are normal.  Neck: Neck supple. No thyromegaly present.  Cardiovascular: Normal rate, regular rhythm, normal heart sounds and intact distal pulses.   No murmur heard. Pulmonary/Chest: Effort normal and breath sounds normal. No respiratory distress.  She has no wheezes.  Musculoskeletal: Normal range of motion. She exhibits no edema or tenderness.  Lymphadenopathy:    She has no cervical adenopathy.  Neurological: She is alert and oriented to person, place, and time. Coordination normal.  Skin: Skin is warm and dry. No rash noted. She is not diaphoretic.  Psychiatric: She has a normal mood and affect. Her behavior is normal.  Vitals reviewed.  Results for orders placed or performed in visit on 02/20/16  CoaguChek XS/INR Waived  Result Value Ref Range   INR 2.4 (H) 0.9 - 1.1   Prothrombin Time 29.3 sec      Assessment & Plan:   Problem List Items Addressed This Visit    None    Visit Diagnoses    Acute suppurative otitis media of left ear without spontaneous rupture of tympanic membrane, recurrence not specified    -  Primary    Relevant Medications    doxycycline (ADOXA) 100 MG tablet        Follow up plan: Return if symptoms worsen or fail to improve, for Coumadin and recheck in 1 week with Tammy.  Counseling provided for all of the vaccine components No orders of the defined types were placed in this encounter.    [name], MD [facility] 02/20/2016, 7:16 PM

## 2016-03-07 NOTE — Telephone Encounter (Signed)
She changed her mind

## 2016-03-10 DIAGNOSIS — J449 Chronic obstructive pulmonary disease, unspecified: Secondary | ICD-10-CM | POA: Diagnosis not present

## 2016-03-12 NOTE — Telephone Encounter (Signed)
I spoke with Dr.Stacks and after I advised him that the address on the form wasn't the patients home address, he said he would only sign the forms for the pt if he receives a notorized document stating the patient lives at stated address over half of the time. Pt's daughter was advised of this and she said to just throw the form away and not to worry about it and then hung the phone up. Forms were placed in the shredder at the nurses station.

## 2016-03-17 ENCOUNTER — Other Ambulatory Visit: Payer: Self-pay | Admitting: Family Medicine

## 2016-03-20 ENCOUNTER — Ambulatory Visit (INDEPENDENT_AMBULATORY_CARE_PROVIDER_SITE_OTHER): Payer: Commercial Managed Care - HMO | Admitting: Family Medicine

## 2016-03-20 ENCOUNTER — Encounter: Payer: Self-pay | Admitting: Family Medicine

## 2016-03-20 VITALS — BP 129/60 | HR 66 | Temp 97.5°F | Ht 66.0 in | Wt 273.8 lb

## 2016-03-20 DIAGNOSIS — I482 Chronic atrial fibrillation, unspecified: Secondary | ICD-10-CM

## 2016-03-20 DIAGNOSIS — N183 Chronic kidney disease, stage 3 unspecified: Secondary | ICD-10-CM

## 2016-03-20 DIAGNOSIS — J9611 Chronic respiratory failure with hypoxia: Secondary | ICD-10-CM

## 2016-03-20 DIAGNOSIS — I5022 Chronic systolic (congestive) heart failure: Secondary | ICD-10-CM

## 2016-03-20 DIAGNOSIS — E1121 Type 2 diabetes mellitus with diabetic nephropathy: Secondary | ICD-10-CM

## 2016-03-20 DIAGNOSIS — Z1211 Encounter for screening for malignant neoplasm of colon: Secondary | ICD-10-CM | POA: Diagnosis not present

## 2016-03-20 LAB — COAGUCHEK XS/INR WAIVED
INR: 3.2 — AB (ref 0.9–1.1)
Prothrombin Time: 38.2 s

## 2016-03-20 MED ORDER — INSULIN DETEMIR 100 UNIT/ML FLEXPEN: 90.0000 [IU] | PEN_INJECTOR | Freq: Every day | SUBCUTANEOUS | Status: DC

## 2016-03-20 NOTE — Addendum Note (Signed)
Addended by: Earlene Plater on: 03/20/2016 04:30 PM   Modules accepted: Orders

## 2016-03-20 NOTE — Progress Notes (Signed)
Subjective:  Patient ID: [name], female    DOB: [birth year]  Age: 70 y.o. MRN: [id number]  CC: Follow-up   HPI REGGIE WELGE presents for  follow-up of hypertension. Patient has no history of headache chest pain or shortness of breath or recent cough. Patient also denies symptoms of TIA such as numbness weakness lateralizing. Patient checks  blood pressure at home and has not had any elevated readings recently. Patient denies side effects from his medication. States taking it regularly.  Patient also  in for follow-up of elevated cholesterol. Doing well without complaints on current medication. Denies side effects of statin including myalgia and arthralgia and nausea. Also in today for liver function testing. Currently no chest pain, shortness of breath or other cardiovascular related symptoms noted.  Follow-up of diabetes. Patient does check blood sugar at home. Readings run between 125 and 131 fasting, but gets up to 200 at hs to avoid overnight lows. Patient denies symptoms such as polyuria, polydipsia, excessive hunger, nausea Several overnight significant hypoglycemic spells noted. Gets sweaty and nauseated Medications as noted below. Taking them regularly without complication/adverse reaction being reported today.    Patient in for follow-up of atrial fibrillation. Patient denies any recent bouts of chest pain or palpitations. Additionally, patient is taking anticoagulants. Patient denies any recent excessive bleeding episodes including epistaxis, bleeding from the gums, genitalia, rectal bleeding or hematuria. Additionally there has been no excessive bruising.   History Rudine has a past medical history of Anemia; Arthritis; Asthma; Cataract; CHF (congestive heart failure) ([place]); Diabetes mellitus; Hyperlipidemia; Hypertension; CAD (coronary artery disease); Stroke [facility]); Oxygen dependent; Gait instability; GERD (gastroesophageal reflux disease); Obesity; Sleep apnea,  obstructive (2008); Atrial fibrillation ([place]); and COPD (chronic obstructive pulmonary disease) ([place]).   She has past surgical history that includes Cataract extraction w/ intraocular lens  implant, bilateral; Appendectomy; Cholecystectomy; Carpal tunnel release; Cervical spine surgery; Cesarean section; Umbilical hernia repair; Knee arthroscopy; Colonoscopy (11/22/2002); Upper gastrointestinal endoscopy (11/22/2002); Colonoscopy (11/26/2005); Femur IM nail (Left, 05/08/2013); Hip surgery (Left); and Eye surgery.   Her family history includes Alzheimer's disease in her father and mother; Asthma in her daughter and grandchild; Breast cancer in her sister; Cancer in her brother, mother, and sister; Colon cancer in her mother; Diabetes in her daughter and mother; Heart attack in her mother and sister; Heart disease in her brother, mother, and sister; Kidney disease in her mother; Migraines in her sister.She reports that she has never smoked. She has never used smokeless tobacco. She reports that she does not drink alcohol or use illicit drugs.  Current Outpatient Prescriptions on File Prior to Visit  Medication Sig Dispense Refill  . acetaminophen (TYLENOL) 325 MG tablet Take 2 tablets (650 mg total) by mouth every 6 (six) hours as needed for pain or fever.    Marland Kitchen alendronate (FOSAMAX) 70 MG tablet Take 1 tablet (70 mg total) by mouth every 7 (seven) days. 4 tablet 2  . allopurinol (ZYLOPRIM) 100 MG tablet Take 1 tablet (100 mg total) by mouth daily. 90 tablet 1  . Cholecalciferol (CVS VITAMIN D3) 1000 UNITS CHEW Chew 1 tablet (1,000 Units total) by mouth daily. 30 tablet   . colchicine 0.6 MG tablet 1.2 mg then 0.6 mg one hour for pain 30 tablet 1  . diltiazem (CARDIZEM CD) 180 MG 24 hr capsule Take 1 capsule (180 mg total) by mouth daily. 90 capsule 0  . doxycycline (ADOXA) 100 MG tablet Take 1 tablet (100 mg total) by mouth 2 (  two) times daily. 14 tablet 0  . Elastic Bandages & Supports (JOBST RELIEF  30-40MMHG XL) MISC Wear daily for swelling in legs 2 each 11  . fexofenadine (ALLEGRA) 180 MG tablet Take 1 tablet (180 mg total) by mouth daily. 30 tablet 11  . fish oil-omega-3 fatty acids 1000 MG capsule Take 2 g by mouth daily.      . fluocinonide-emollient (LIDEX-E) 0.05 % cream Apply to feet after soaking each night 60 g 5  . fluticasone (FLONASE) 50 MCG/ACT nasal spray Place 1 spray into both nostrils 2 (two) times daily as needed for allergies or rhinitis. 16 g 6  . gabapentin (NEURONTIN) 100 MG capsule Take 1 capsule (100 mg total) by mouth 3 (three) times daily. 90 capsule 0  . HEMATINIC/FOLIC ACID 196-2 MG TABS TAKE 1 TABLET DAILY 30 each 0  . insulin aspart (NOVOLOG) 100 UNIT/ML injection Use as directed 3 vial PRN  . KLOR-CON M20 20 MEQ tablet Take 20 mEq by mouth daily. Reported on 02/20/2016  4  . KLOR-CON M20 20 MEQ tablet TAKE ONE TABLET BY MOUTH EVERY DAY 30 tablet 1  . Lancets MISC     . Melatonin 3 MG TABS Take 1 tablet (3 mg total) by mouth at bedtime as needed.  0  . metFORMIN (GLUCOPHAGE) 850 MG tablet TAKE 1 TABLET (850 MG TOTAL) BY MOUTH 2 (TWO) TIMES DAILY. 60 tablet 4  . rosuvastatin (CRESTOR) 10 MG tablet TAKE 1 TABLET (10 MG TOTAL) BY MOUTH AT BEDTIME. 30 tablet 1  . torsemide (DEMADEX) 20 MG tablet TAKE 3 TABLETS (60 MG TOTAL) BY MOUTH 2 (TWO) TIMES DAILY. WITH BREAKFAST AND AGAIN AROUND 2 TO 3 PM 180 tablet 2  . ULORIC 40 MG tablet Take 1 tablet by mouth daily.    Marland Kitchen warfarin (COUMADIN) 5 MG tablet Take 1/2 to 1 tab po qd. 30 tablet 2   No current facility-administered medications on file prior to visit.    ROS Review of Systems  Constitutional: Negative for fever, activity change and appetite change.  HENT: Negative for congestion, rhinorrhea and sore throat.   Eyes: Negative for visual disturbance.  Respiratory: Negative for cough and shortness of breath.   Cardiovascular: Negative for chest pain and palpitations.  Gastrointestinal: Negative for nausea,  abdominal pain and diarrhea.  Genitourinary: Negative for dysuria.  Musculoskeletal: Negative for myalgias and arthralgias.    Objective:  BP 129/60 mmHg  Pulse 66  Temp(Src) 97.5 F (36.4 C) (Oral)  Ht 5' 6"  (1.676 m)  Wt 273 lb 12.8 oz (124.195 kg)  BMI 44.21 kg/m2  SpO2 95%  BP Readings from Last 3 Encounters:  03/20/16 129/60  02/20/16 122/70  12/21/15 119/59    Wt Readings from Last 3 Encounters:  03/20/16 273 lb 12.8 oz (124.195 kg)  02/20/16 274 lb 6.4 oz (124.467 kg)  12/21/15 282 lb (127.914 kg)     Physical Exam  Constitutional: She is oriented to person, place, and time. She appears well-developed and well-nourished. No distress.  HENT:  Head: Normocephalic and atraumatic.  Right Ear: External ear normal.  Left Ear: External ear normal.  Nose: Nose normal.  Mouth/Throat: Oropharynx is clear and moist.  Eyes: Conjunctivae and EOM are normal. Pupils are equal, round, and reactive to light.  Neck: Normal range of motion. Neck supple. No thyromegaly present.  Cardiovascular: Normal rate, regular rhythm and normal heart sounds.   No murmur heard. Pulmonary/Chest: Effort normal and breath sounds normal. No respiratory distress. She  has no wheezes. She has no rales.  Abdominal: Soft. Bowel sounds are normal. She exhibits no distension. There is no tenderness.  Lymphadenopathy:    She has no cervical adenopathy.  Neurological: She is alert and oriented to person, place, and time. She has normal reflexes.  Skin: Skin is warm and dry.  Psychiatric: She has a normal mood and affect. Her behavior is normal. Judgment and thought content normal.    Lab Results  Component Value Date   HGBA1C 7.0 12/21/2015   HGBA1C 6.6 09/24/2015   HGBA1C 6.8 06/25/2015    Lab Results  Component Value Date   WBC 7.4 12/21/2015   HGB 9.2* 06/25/2015   HCT 29.5* 12/21/2015   PLT 199 12/21/2015   GLUCOSE 135* 12/21/2015   CHOL 101 12/21/2015   TRIG 113 12/21/2015   HDL 43  12/21/2015   LDLCALC 35 12/21/2015   ALT 10 12/21/2015   AST 18 12/21/2015   NA 144 12/21/2015   K 4.9 12/21/2015   CL 103 12/21/2015   CREATININE 1.16* 12/21/2015   BUN 17 12/21/2015   CO2 27 12/21/2015   TSH 2.960 03/22/2015   INR 2.4* 02/20/2016   HGBA1C 7.0 12/21/2015    Nm Bone Scan Whole Body  10/10/2015  CLINICAL DATA:  Back pain.  Fall 2 years ago. EXAM: NUCLEAR MEDICINE WHOLE BODY BONE SCAN TECHNIQUE: Whole body anterior and posterior images were obtained approximately 3 hours after intravenous injection of radiopharmaceutical. RADIOPHARMACEUTICALS:  25.7 mCi Technetium-26mMDP IV COMPARISON:  CT chest 08/28/2015. Ultrasound kidneys 08/11/2015. Lumbar spine series 07/20/2015. FINDINGS: Right renal atrophy again noted. Bilateral renal function and excretion is present. No focal significant bony abnormalities identified. Minimal increase activity noted about the shoulders, both sternoclavicular joints, both knees, and about the ankle and feet consistent with degenerative change. IMPRESSION: Degenerative changes otherwise no significant abnormality. Electronically Signed   By: TMarcello Moores Register   On: 10/10/2015 13:04    Assessment & Plan:   MJuliannwas seen today for follow-up.  Diagnoses and all orders for this visit:  Chronic atrial fibrillation (HCC) -     CoaguChek XS/INR Waived  Chronic systolic congestive heart failure (HCC)  Chronic respiratory failure with hypoxia (HCC)  Chronic renal insufficiency, stage 3 (moderate) -     CBC with Differential/Platelet -     CMP14+EGFR -     TSH + free T4 -     CoaguChek XS/INR Waived -     POCT glycosylated hemoglobin (Hb A1C) -     Microalbumin / creatinine urine ratio -     Urinalysis  Diabetic nephropathy associated with type 2 diabetes mellitus (HCC) -     CBC with Differential/Platelet -     CMP14+EGFR -     TSH + free T4 -     CoaguChek XS/INR Waived -     POCT glycosylated hemoglobin (Hb A1C) -     Microalbumin /  creatinine urine ratio -     Urinalysis  Morbid obesity, unspecified obesity type (HCC)  Other orders -     Insulin Detemir (LEVEMIR FLEXTOUCH) 100 UNIT/ML Pen; Inject 90 Units into the skin daily with breakfast.  I have changed Ms. Lauderback's Insulin Detemir. I am also having her maintain her fish oil-omega-3 fatty acids, alendronate, acetaminophen, Lancets, gabapentin, Cholecalciferol, HEMATINIC/FOLIC ACID, fexofenadine, JOBST RELIEF 30-40MMHG XL, colchicine, insulin aspart, KLOR-CON M20, fluocinonide-emollient, Melatonin, allopurinol, torsemide, metFORMIN, diltiazem, warfarin, ULORIC, doxycycline, fluticasone, rosuvastatin, and KLOR-CON M20.  Meds ordered this encounter  Medications  . Insulin Detemir (LEVEMIR FLEXTOUCH) 100 UNIT/ML Pen    Sig: Inject 90 Units into the skin daily with breakfast.    Dispense:  30 mL    Refill:  5     Follow-up: Return in about 3 months (around 06/19/2016).  Claretta Fraise, M.D.

## 2016-03-21 LAB — FECAL OCCULT BLOOD, IMMUNOCHEMICAL: Fecal Occult Bld: POSITIVE — AB

## 2016-03-31 ENCOUNTER — Telehealth: Payer: Self-pay | Admitting: Pharmacist

## 2016-04-02 NOTE — Telephone Encounter (Signed)
Patient notified of positive FOBT.  She is to come in tomorrow to pick up repeat FOBT and have labs drawn that were not drawn when she saw Dr Livia Snellen 03/20/2016

## 2016-04-03 ENCOUNTER — Other Ambulatory Visit: Payer: Commercial Managed Care - HMO

## 2016-04-03 DIAGNOSIS — E1121 Type 2 diabetes mellitus with diabetic nephropathy: Secondary | ICD-10-CM | POA: Diagnosis not present

## 2016-04-03 DIAGNOSIS — N183 Chronic kidney disease, stage 3 (moderate): Secondary | ICD-10-CM | POA: Diagnosis not present

## 2016-04-03 LAB — URINALYSIS
BILIRUBIN UA: NEGATIVE
Glucose, UA: NEGATIVE
KETONES UA: NEGATIVE
Nitrite, UA: NEGATIVE
PH UA: 5 (ref 5.0–7.5)
PROTEIN UA: NEGATIVE
RBC UA: NEGATIVE
SPEC GRAV UA: 1.01 (ref 1.005–1.030)
Urobilinogen, Ur: 0.2 mg/dL (ref 0.2–1.0)

## 2016-04-04 LAB — CBC WITH DIFFERENTIAL/PLATELET
BASOS ABS: 0 10*3/uL (ref 0.0–0.2)
Basos: 0 %
EOS (ABSOLUTE): 0.1 10*3/uL (ref 0.0–0.4)
Eos: 2 %
Hematocrit: 29.6 % — ABNORMAL LOW (ref 34.0–46.6)
Hemoglobin: 9.2 g/dL — ABNORMAL LOW (ref 11.1–15.9)
IMMATURE GRANS (ABS): 0 10*3/uL (ref 0.0–0.1)
IMMATURE GRANULOCYTES: 0 %
LYMPHS: 19 %
Lymphocytes Absolute: 1.3 10*3/uL (ref 0.7–3.1)
MCH: 27.1 pg (ref 26.6–33.0)
MCHC: 31.1 g/dL — ABNORMAL LOW (ref 31.5–35.7)
MCV: 87 fL (ref 79–97)
Monocytes Absolute: 0.5 10*3/uL (ref 0.1–0.9)
Monocytes: 7 %
Neutrophils Absolute: 5 10*3/uL (ref 1.4–7.0)
Neutrophils: 72 %
PLATELETS: 176 10*3/uL (ref 150–379)
RBC: 3.4 x10E6/uL — AB (ref 3.77–5.28)
RDW: 17.4 % — ABNORMAL HIGH (ref 12.3–15.4)
WBC: 6.9 10*3/uL (ref 3.4–10.8)

## 2016-04-04 LAB — CMP14+EGFR
ALT: 11 IU/L (ref 0–32)
AST: 13 IU/L (ref 0–40)
Albumin/Globulin Ratio: 1.8 (ref 1.2–2.2)
Albumin: 4 g/dL (ref 3.5–4.8)
Alkaline Phosphatase: 116 IU/L (ref 39–117)
BILIRUBIN TOTAL: 0.3 mg/dL (ref 0.0–1.2)
BUN/Creatinine Ratio: 26 (ref 12–28)
BUN: 37 mg/dL — AB (ref 8–27)
CALCIUM: 9.2 mg/dL (ref 8.7–10.3)
CHLORIDE: 100 mmol/L (ref 96–106)
CO2: 23 mmol/L (ref 18–29)
Creatinine, Ser: 1.41 mg/dL — ABNORMAL HIGH (ref 0.57–1.00)
GFR, EST AFRICAN AMERICAN: 43 mL/min/{1.73_m2} — AB (ref >59)
GFR, EST NON AFRICAN AMERICAN: 37 mL/min/{1.73_m2} — AB (ref >59)
GLUCOSE: 210 mg/dL — AB (ref 65–99)
Globulin, Total: 2.2 g/dL (ref 1.5–4.5)
Potassium: 4.8 mmol/L (ref 3.5–5.2)
Sodium: 140 mmol/L (ref 134–144)
TOTAL PROTEIN: 6.2 g/dL (ref 6.0–8.5)

## 2016-04-04 LAB — TSH+FREE T4
Free T4: 1.13 ng/dL (ref 0.82–1.77)
TSH: 4.56 u[IU]/mL — ABNORMAL HIGH (ref 0.450–4.500)

## 2016-04-04 LAB — MICROALBUMIN / CREATININE URINE RATIO
Creatinine, Urine: 55.2 mg/dL
MICROALB/CREAT RATIO: 6.7 mg/g creat (ref 0.0–30.0)
Microalbumin, Urine: 3.7 ug/mL

## 2016-04-09 DIAGNOSIS — J449 Chronic obstructive pulmonary disease, unspecified: Secondary | ICD-10-CM | POA: Diagnosis not present

## 2016-04-15 DIAGNOSIS — Z029 Encounter for administrative examinations, unspecified: Secondary | ICD-10-CM

## 2016-04-17 ENCOUNTER — Other Ambulatory Visit: Payer: Self-pay | Admitting: Family

## 2016-04-19 ENCOUNTER — Other Ambulatory Visit: Payer: Self-pay | Admitting: Pharmacist

## 2016-04-21 ENCOUNTER — Ambulatory Visit (INDEPENDENT_AMBULATORY_CARE_PROVIDER_SITE_OTHER): Payer: Commercial Managed Care - HMO | Admitting: Pharmacist

## 2016-04-21 DIAGNOSIS — I482 Chronic atrial fibrillation, unspecified: Secondary | ICD-10-CM

## 2016-04-21 LAB — COAGUCHEK XS/INR WAIVED
INR: 2.2 — ABNORMAL HIGH (ref 0.9–1.1)
Prothrombin Time: 26.7 s

## 2016-04-21 NOTE — Patient Instructions (Signed)
Anticoagulation Dose Instructions as of 04/21/2016      [name] Tue Wed Thu Fri Sat   New Dose 2.5 mg 5 mg 5 mg 5 mg 2.5 mg 5 mg 5 mg    Description        Continue current warfarin dose of 5mg  1/2 sundays and thursdays and 1 tablet all other days.      INR was 2.2 today

## 2016-05-06 ENCOUNTER — Other Ambulatory Visit: Payer: Self-pay | Admitting: Pharmacist

## 2016-05-06 ENCOUNTER — Other Ambulatory Visit: Payer: Commercial Managed Care - HMO

## 2016-05-06 DIAGNOSIS — Z1211 Encounter for screening for malignant neoplasm of colon: Secondary | ICD-10-CM

## 2016-05-07 LAB — FECAL OCCULT BLOOD, IMMUNOCHEMICAL: FECAL OCCULT BLD: NEGATIVE

## 2016-05-10 DIAGNOSIS — J449 Chronic obstructive pulmonary disease, unspecified: Secondary | ICD-10-CM | POA: Diagnosis not present

## 2016-05-15 ENCOUNTER — Other Ambulatory Visit: Payer: Self-pay | Admitting: Family Medicine

## 2016-05-20 ENCOUNTER — Encounter: Payer: Self-pay | Admitting: Pharmacist

## 2016-06-09 DIAGNOSIS — J449 Chronic obstructive pulmonary disease, unspecified: Secondary | ICD-10-CM | POA: Diagnosis not present

## 2016-06-17 ENCOUNTER — Other Ambulatory Visit: Payer: Self-pay | Admitting: Family Medicine

## 2016-06-17 NOTE — Telephone Encounter (Signed)
Patient's last A1C was in January 2017, please advise

## 2016-06-17 NOTE — Telephone Encounter (Signed)
Defer to pcp.   Laroy Apple, MD [facility] 06/17/2016, 8:27 AM

## 2016-06-18 ENCOUNTER — Other Ambulatory Visit: Payer: Self-pay | Admitting: Family Medicine

## 2016-06-19 ENCOUNTER — Other Ambulatory Visit: Payer: Self-pay | Admitting: Family Medicine

## 2016-06-23 ENCOUNTER — Encounter: Payer: Self-pay | Admitting: Family Medicine

## 2016-06-23 ENCOUNTER — Ambulatory Visit (INDEPENDENT_AMBULATORY_CARE_PROVIDER_SITE_OTHER): Payer: Commercial Managed Care - HMO | Admitting: Family Medicine

## 2016-06-23 VITALS — BP 129/57 | HR 69 | Temp 97.6°F | Ht 66.0 in | Wt 278.4 lb

## 2016-06-23 DIAGNOSIS — I270 Primary pulmonary hypertension: Secondary | ICD-10-CM

## 2016-06-23 DIAGNOSIS — I5022 Chronic systolic (congestive) heart failure: Secondary | ICD-10-CM | POA: Diagnosis not present

## 2016-06-23 DIAGNOSIS — Z794 Long term (current) use of insulin: Secondary | ICD-10-CM

## 2016-06-23 DIAGNOSIS — L84 Corns and callosities: Secondary | ICD-10-CM

## 2016-06-23 DIAGNOSIS — E1121 Type 2 diabetes mellitus with diabetic nephropathy: Secondary | ICD-10-CM

## 2016-06-23 DIAGNOSIS — I482 Chronic atrial fibrillation, unspecified: Secondary | ICD-10-CM

## 2016-06-23 LAB — BAYER DCA HB A1C WAIVED: HB A1C (BAYER DCA - WAIVED): 6.7 % (ref <7.0)

## 2016-06-23 MED ORDER — INSULIN DETEMIR 100 UNIT/ML FLEXPEN: 70.0000 [IU] | PEN_INJECTOR | Freq: Every day | SUBCUTANEOUS | 5 refills | Status: DC

## 2016-06-23 MED ORDER — DULAGLUTIDE 0.75 MG/0.5ML ~~LOC~~ SOAJ: 0.7500 mg | SUBCUTANEOUS | 5 refills | Status: DC

## 2016-06-23 NOTE — Progress Notes (Signed)
. HPI Wendy RUPPERT presents forFollow-up of diabetes. Patient checks blood sugar at home.  140 fasting and 150 postprandial Patient denies symptoms such as polyuria, polydipsia, excessive hunger, nausea Every other day significant hypoglycemic spells noted for 2 weeks. Readings 104-108 Medications as noted below. Taking them regularly without complication/adverse reaction being reported today. Checking feet daily. Last eye appt was 3 years ago.  History [name] has a past medical history of Anemia; Arthritis; Asthma; Atrial fibrillation ([place]); CAD (coronary artery disease); Cataract; CHF (congestive heart failure) ([place]); COPD (chronic obstructive pulmonary disease) ([place]); Diabetes mellitus; Gait instability; GERD (gastroesophageal reflux disease); Hyperlipidemia; Hypertension; Obesity; Oxygen dependent; Sleep apnea, obstructive (2008); and Stroke [facility]).   She has a past surgical history that includes Cataract extraction w/ intraocular lens  implant, bilateral; Appendectomy; Cholecystectomy; Carpal tunnel release; Cervical spine surgery; Cesarean section; Umbilical hernia repair; Knee arthroscopy; Colonoscopy (11/22/2002); Upper gastrointestinal endoscopy (11/22/2002); Colonoscopy (11/26/2005); Femur IM nail (Left, 05/08/2013); Hip surgery (Left); and Eye surgery.   Her family history includes Alzheimer's disease in her father and mother; Asthma in her daughter and grandchild; Breast cancer in her sister; Cancer in her brother, mother, and sister; Colon cancer in her mother; Diabetes in her daughter and mother; Heart attack in her mother and sister; Heart disease in her brother, mother, and sister; Kidney disease in her mother; Migraines in her sister.She reports that she has never smoked. She has never used smokeless tobacco. She reports that she does not drink alcohol or use drugs.  Current Outpatient Prescriptions on File Prior to Visit  Medication Sig Dispense Refill  . acetaminophen (TYLENOL)  325 MG tablet Take 2 tablets (650 mg total) by mouth every 6 (six) hours as needed for pain or fever.    Marland Kitchen alendronate (FOSAMAX) 70 MG tablet Take 1 tablet (70 mg total) by mouth every 7 (seven) days. 4 tablet 2  . allopurinol (ZYLOPRIM) 100 MG tablet Take 1 tablet (100 mg total) by mouth daily. 90 tablet 1  . B-D ULTRAFINE III SHORT PEN 31G X 8 MM MISC     . Cholecalciferol (CVS VITAMIN D3) 1000 UNITS CHEW Chew 1 tablet (1,000 Units total) by mouth daily. 30 tablet   . colchicine 0.6 MG tablet 1.2 mg then 0.6 mg one hour for pain 30 tablet 1  . diltiazem (CARDIZEM CD) 180 MG 24 hr capsule TAKE 1 CAPSULE (180 MG TOTAL) BY MOUTH DAILY. 90 capsule 0  . Elastic Bandages & Supports (JOBST RELIEF 30-40MMHG XL) MISC Wear daily for swelling in legs 2 each 11  . fexofenadine (ALLEGRA) 180 MG tablet Take 1 tablet (180 mg total) by mouth daily. 30 tablet 11  . fish oil-omega-3 fatty acids 1000 MG capsule Take 2 g by mouth daily.      . fluocinonide-emollient (LIDEX-E) 0.05 % cream Apply to feet after soaking each night 60 g 5  . fluticasone (FLONASE) 50 MCG/ACT nasal spray Place 1 spray into both nostrils 2 (two) times daily as needed for allergies or rhinitis. 16 g 6  . gabapentin (NEURONTIN) 100 MG capsule Take 1 capsule (100 mg total) by mouth 3 (three) times daily. 90 capsule 0  . HEMATINIC/FOLIC ACID 702-6 MG TABS TAKE 1 TABLET DAILY 30 each 0  . KLOR-CON M20 20 MEQ tablet TAKE ONE TABLET BY MOUTH EVERY DAY 30 tablet 2  . Lancets MISC     . lisinopril (PRINIVIL,ZESTRIL) 5 MG tablet Take 5 mg by mouth daily.    . Melatonin 3 MG TABS  Take 1 tablet (3 mg total) by mouth at bedtime as needed.  0  . metFORMIN (GLUCOPHAGE) 850 MG tablet TAKE 1 TABLET (850 MG TOTAL) BY MOUTH 2 (TWO) TIMES DAILY. 60 tablet 4  . rosuvastatin (CRESTOR) 10 MG tablet TAKE 1 TABLET (10 MG TOTAL) BY MOUTH AT BEDTIME. 30 tablet 1  . torsemide (DEMADEX) 20 MG tablet TAKE 3 TABLETS (60 MG TOTAL) BY MOUTH 2 (TWO) TIMES DAILY. WITH  BREAKFAST AND AGAIN AROUND 2 TO 3 PM 180 tablet 2  . ULORIC 40 MG tablet TAKE 1 TABLET (40 MG TOTAL) BY MOUTH DAILY. 90 tablet 1  . warfarin (COUMADIN) 5 MG tablet Take 1/2 to 1 tab po qd. 30 tablet 2   No current facility-administered medications on file prior to visit.     ROS Review of Systems  Constitutional: Negative for activity change, appetite change and fever.  HENT: Negative for congestion, rhinorrhea and sore throat.   Eyes: Positive for visual disturbance (doesn't see as well lately).  Respiratory: Negative for cough and shortness of breath.   Cardiovascular: Positive for leg swelling (stable). Negative for chest pain and palpitations.  Gastrointestinal: Negative for abdominal pain, diarrhea and nausea.  Genitourinary: Negative for dysuria.  Musculoskeletal: Negative for arthralgias and myalgias.       Pain on botom of right foot at heel, moderately severe for 1-2  weeks    Objective:  BP (!) 129/57 (BP Location: Right Wrist, Patient Position: Sitting, Cuff Size: Large)   Pulse 69   Temp 97.6 F (36.4 C) (Oral)   Ht 5' 6"  (1.676 m)   Wt 278 lb 6.4 oz (126.3 kg)   SpO2 97%   BMI 44.93 kg/m   BP Readings from Last 3 Encounters:  06/23/16 (!) 129/57  03/20/16 129/60  02/20/16 122/70    Wt Readings from Last 3 Encounters:  06/23/16 278 lb 6.4 oz (126.3 kg)  03/20/16 273 lb 12.8 oz (124.2 kg)  02/20/16 274 lb 6.4 oz (124.5 kg)     Physical Exam  Constitutional: She is oriented to person, place, and time. She appears well-developed and well-nourished. No distress.  HENT:  Head: Normocephalic and atraumatic.  Right Ear: External ear normal.  Left Ear: External ear normal.  Nose: Nose normal.  Mouth/Throat: Oropharynx is clear and moist.  Eyes: Conjunctivae and EOM are normal. Pupils are equal, round, and reactive to light.  Neck: Normal range of motion. Neck supple. No thyromegaly present.  Cardiovascular: Normal rate and regular rhythm.   Murmur  heard. Pulmonary/Chest: Effort normal and breath sounds normal. No respiratory distress. She has no wheezes. She has no rales.  Abdominal: Soft. Bowel sounds are normal. She exhibits no distension. There is no tenderness.  Musculoskeletal: She exhibits edema and tenderness (tender callus at right heel. Exfolliative scale covers foot bottom).  Lymphadenopathy:    She has no cervical adenopathy.  Neurological: She is alert and oriented to person, place, and time. She has normal reflexes.  Skin: Skin is warm and dry.  Psychiatric: She has a normal mood and affect. Her behavior is normal. Judgment and thought content normal.    Lab Results  Component Value Date   HGBA1C 7.0 12/21/2015   HGBA1C 6.6 09/24/2015   HGBA1C 6.8 06/25/2015    Lab Results  Component Value Date   WBC 6.9 04/03/2016   HGB 9.2 (A) 06/25/2015   HCT 29.6 (L) 04/03/2016   PLT 176 04/03/2016   GLUCOSE 210 (H) 04/03/2016   CHOL 101  12/21/2015   TRIG 113 12/21/2015   HDL 43 12/21/2015   LDLCALC 35 12/21/2015   ALT 11 04/03/2016   AST 13 04/03/2016   NA 140 04/03/2016   K 4.8 04/03/2016   CL 100 04/03/2016   CREATININE 1.41 (H) 04/03/2016   BUN 37 (H) 04/03/2016   CO2 23 04/03/2016   TSH 4.560 (H) 04/03/2016   INR 2.2 (H) 04/21/2016   HGBA1C 7.0 12/21/2015     Assessment & Plan:   Yenifer was seen today for diabetes, hyperlipidemia, depression, congestive heart failure and callouses.  Diagnoses and all orders for this visit:  Diabetic nephropathy associated with type 2 diabetes mellitus ([place]) -     Ambulatory referral to Ophthalmology  Encounter for long-term (current) use of insulin ([place]) -     Bayer DCA Hb A1c Waived -     CMP14+EGFR  Chronic atrial fibrillation (HCC)  Chronic systolic congestive heart failure (HCC)  PULMONARY HYPERTENSION  Morbid obesity, unspecified obesity type ([place])  Callus of heel -     Ambulatory referral to Podiatry  Other orders -     Insulin Detemir (LEVEMIR  FLEXTOUCH) 100 UNIT/ML Pen; Inject 70 Units into the skin daily with breakfast. -     Dulaglutide (TRULICITY) 5.36 UY/4.0HK SOPN; Inject 0.75 mg into the skin once a week.      I have discontinued Ms. Jennette's insulin aspart. I have also changed her Insulin Detemir. Additionally, I am having her start on Dulaglutide. Lastly, I am having her maintain her fish oil-omega-3 fatty acids, alendronate, acetaminophen, Lancets, gabapentin, Cholecalciferol, HEMATINIC/FOLIC ACID, fexofenadine, JOBST RELIEF 30-40MMHG XL, colchicine, fluocinonide-emollient, Melatonin, allopurinol, torsemide, warfarin, fluticasone, ULORIC, diltiazem, B-D ULTRAFINE III SHORT PEN, lisinopril, KLOR-CON M20, metFORMIN, and rosuvastatin.  Meds ordered this encounter  Medications  . Insulin Detemir (LEVEMIR FLEXTOUCH) 100 UNIT/ML Pen    Sig: Inject 70 Units into the skin daily with breakfast.    Dispense:  30 mL    Refill:  5  . Dulaglutide (TRULICITY) 7.42 VZ/5.6LO SOPN    Sig: Inject 0.75 mg into the skin once a week.    Dispense:  4 pen    Refill:  5     Follow-up: Return in about 3 weeks (around 07/14/2016) for diabetes.  Claretta Fraise, M.D.

## 2016-06-24 LAB — CMP14+EGFR
A/G RATIO: 1.8 (ref 1.2–2.2)
ALBUMIN: 4.1 g/dL (ref 3.5–4.8)
ALT: 11 IU/L (ref 0–32)
AST: 12 IU/L (ref 0–40)
Alkaline Phosphatase: 123 IU/L — ABNORMAL HIGH (ref 39–117)
BUN / CREAT RATIO: 21 (ref 12–28)
BUN: 30 mg/dL — ABNORMAL HIGH (ref 8–27)
Bilirubin Total: 0.4 mg/dL (ref 0.0–1.2)
CALCIUM: 8.8 mg/dL (ref 8.7–10.3)
CO2: 25 mmol/L (ref 18–29)
CREATININE: 1.43 mg/dL — AB (ref 0.57–1.00)
Chloride: 105 mmol/L (ref 96–106)
GFR, EST AFRICAN AMERICAN: 42 mL/min/{1.73_m2} — AB (ref >59)
GFR, EST NON AFRICAN AMERICAN: 37 mL/min/{1.73_m2} — AB (ref >59)
GLOBULIN, TOTAL: 2.3 g/dL (ref 1.5–4.5)
Glucose: 104 mg/dL — ABNORMAL HIGH (ref 65–99)
POTASSIUM: 4.4 mmol/L (ref 3.5–5.2)
SODIUM: 144 mmol/L (ref 134–144)
TOTAL PROTEIN: 6.4 g/dL (ref 6.0–8.5)

## 2016-07-10 DIAGNOSIS — J449 Chronic obstructive pulmonary disease, unspecified: Secondary | ICD-10-CM | POA: Diagnosis not present

## 2016-07-14 ENCOUNTER — Other Ambulatory Visit: Payer: Self-pay | Admitting: Family

## 2016-07-14 ENCOUNTER — Other Ambulatory Visit: Payer: Self-pay | Admitting: *Deleted

## 2016-07-14 DIAGNOSIS — I959 Hypotension, unspecified: Secondary | ICD-10-CM

## 2016-07-14 MED ORDER — LISINOPRIL 5 MG PO TABS: 5.0000 mg | ORAL_TABLET | Freq: Every day | ORAL | 1 refills | Status: DC

## 2016-07-14 MED ORDER — DILTIAZEM HCL ER COATED BEADS 180 MG PO CP24: ORAL_CAPSULE | ORAL | 0 refills | Status: DC

## 2016-07-14 MED ORDER — POTASSIUM CHLORIDE CRYS ER 20 MEQ PO TBCR: 20.0000 meq | EXTENDED_RELEASE_TABLET | Freq: Every day | ORAL | 2 refills | Status: DC

## 2016-07-14 MED ORDER — ROSUVASTATIN CALCIUM 10 MG PO TABS: ORAL_TABLET | ORAL | 0 refills | Status: DC

## 2016-07-15 ENCOUNTER — Encounter: Payer: Self-pay | Admitting: Family Medicine

## 2016-07-15 ENCOUNTER — Ambulatory Visit (INDEPENDENT_AMBULATORY_CARE_PROVIDER_SITE_OTHER): Payer: Commercial Managed Care - HMO | Admitting: Family Medicine

## 2016-07-15 VITALS — BP 111/50 | HR 78 | Temp 97.4°F | Ht 66.0 in | Wt 276.0 lb

## 2016-07-15 DIAGNOSIS — I482 Chronic atrial fibrillation, unspecified: Secondary | ICD-10-CM

## 2016-07-15 DIAGNOSIS — E11649 Type 2 diabetes mellitus with hypoglycemia without coma: Secondary | ICD-10-CM

## 2016-07-15 DIAGNOSIS — E1121 Type 2 diabetes mellitus with diabetic nephropathy: Secondary | ICD-10-CM | POA: Diagnosis not present

## 2016-07-15 DIAGNOSIS — N183 Chronic kidney disease, stage 3 unspecified: Secondary | ICD-10-CM

## 2016-07-15 LAB — COAGUCHEK XS/INR WAIVED
INR: 1.8 — AB (ref 0.9–1.1)
PROTHROMBIN TIME: 21.2 s

## 2016-07-15 MED ORDER — INSULIN DETEMIR 100 UNIT/ML FLEXPEN: 65.0000 [IU] | PEN_INJECTOR | Freq: Every day | SUBCUTANEOUS | 5 refills | Status: DC

## 2016-07-15 NOTE — Progress Notes (Signed)
Subjective:  Patient ID: [name], female    DOB: [birth year]  Age: 70 y.o. MRN: [id number]  CC: Diabetes (3 wk rck)   HPI [name] presents forFollow-up of diabetes. Patient checks blood sugar at home.   77-134 fasting and 89-184  postprandial she states Trulicity is working out well. It was affordable at the pharmacy. It's having no side effects. She feels less hungry. And she's not having the nighttime sweats like she did. Patient denies symptoms such as polyuria, polydipsia, excessive hunger, nausea No significant hypoglycemic spells noted. Discussed having during the night or waking her have resolved. However she states that on the mornings that her sugar has been in the 70-90 range she has had some sensation of nausea and hunger. Medications as noted below.    History [name] has a past medical history of Anemia; Arthritis; Asthma; Atrial fibrillation ([place]); CAD (coronary artery disease); Cataract; CHF (congestive heart failure) ([place]); COPD (chronic obstructive pulmonary disease) ([place]); Diabetes mellitus; Gait instability; GERD (gastroesophageal reflux disease); Hyperlipidemia; Hypertension; Obesity; Oxygen dependent; Sleep apnea, obstructive (2008); and Stroke [facility]).   She has a past surgical history that includes Cataract extraction w/ intraocular lens  implant, bilateral; Appendectomy; Cholecystectomy; Carpal tunnel release; Cervical spine surgery; Cesarean section; Umbilical hernia repair; Knee arthroscopy; Colonoscopy (11/22/2002); Upper gastrointestinal endoscopy (11/22/2002); Colonoscopy (11/26/2005); Femur IM nail (Left, 05/08/2013); Hip surgery (Left); and Eye surgery.   Her family history includes Alzheimer's disease in her father and mother; Asthma in her daughter and grandchild; Breast cancer in her sister; Cancer in her brother, mother, and sister; Colon cancer in her mother; Diabetes in her daughter and mother; Heart attack in her mother and sister; Heart  disease in her brother, mother, and sister; Kidney disease in her mother; Migraines in her sister.She reports that she has never smoked. She has never used smokeless tobacco. She reports that she does not drink alcohol or use drugs.  Current Outpatient Prescriptions on File Prior to Visit  Medication Sig Dispense Refill  . acetaminophen (TYLENOL) 325 MG tablet Take 2 tablets (650 mg total) by mouth every 6 (six) hours as needed for pain or fever.    Marland Kitchen alendronate (FOSAMAX) 70 MG tablet Take 1 tablet (70 mg total) by mouth every 7 (seven) days. 4 tablet 2  . allopurinol (ZYLOPRIM) 100 MG tablet Take 1 tablet (100 mg total) by mouth daily. 90 tablet 1  . B-D ULTRAFINE III SHORT PEN 31G X 8 MM MISC     . Cholecalciferol (CVS VITAMIN D3) 1000 UNITS CHEW Chew 1 tablet (1,000 Units total) by mouth daily. 30 tablet   . colchicine 0.6 MG tablet 1.2 mg then 0.6 mg one hour for pain 30 tablet 1  . diltiazem (CARDIZEM CD) 180 MG 24 hr capsule TAKE 1 CAPSULE (180 MG TOTAL) BY MOUTH DAILY. 90 capsule 0  . Dulaglutide (TRULICITY) [id number] [id number] SOPN Inject 0.75 mg into the skin once a week. 4 pen 5  . Elastic Bandages & Supports (JOBST RELIEF 30-40MMHG XL) MISC Wear daily for swelling in legs 2 each 11  . fexofenadine (ALLEGRA) 180 MG tablet Take 1 tablet (180 mg total) by mouth daily. 30 tablet 11  . fish oil-omega-3 fatty acids 1000 MG capsule Take 2 g by mouth daily.      . fluocinonide-emollient (LIDEX-E) 0.05 % cream Apply to feet after soaking each night 60 g 5  . fluticasone (FLONASE) 50 MCG/ACT nasal spray Place 1 spray into both nostrils 2 (two) times  daily as needed for allergies or rhinitis. 16 g 6  . gabapentin (NEURONTIN) 100 MG capsule Take 1 capsule (100 mg total) by mouth 3 (three) times daily. 90 capsule 0  . HEMATINIC/FOLIC ACID 99991111 MG TABS TAKE 1 TABLET DAILY 30 each 0  . Lancets MISC     . lisinopril (PRINIVIL,ZESTRIL) 5 MG tablet Take 1 tablet (5 mg total) by mouth daily. 90 tablet 1  .  Melatonin 3 MG TABS Take 1 tablet (3 mg total) by mouth at bedtime as needed.  0  . metFORMIN (GLUCOPHAGE) 850 MG tablet TAKE 1 TABLET (850 MG TOTAL) BY MOUTH 2 (TWO) TIMES DAILY. 60 tablet 4  . potassium chloride SA (KLOR-CON M20) 20 MEQ tablet Take 1 tablet (20 mEq total) by mouth daily. 90 tablet 2  . rosuvastatin (CRESTOR) 10 MG tablet TAKE 1 TABLET (10 MG TOTAL) BY MOUTH AT BEDTIME. 90 tablet 0  . torsemide (DEMADEX) 20 MG tablet TAKE 3 TABLETS (60 MG TOTAL) BY MOUTH 2 (TWO) TIMES DAILY. WITH BREAKFAST AND AGAIN AROUND 2 TO 3 PM 180 tablet 2  . ULORIC 40 MG tablet TAKE 1 TABLET (40 MG TOTAL) BY MOUTH DAILY. 90 tablet 1  . warfarin (COUMADIN) 5 MG tablet Take 1/2 to 1 tab po qd. 30 tablet 2   No current facility-administered medications on file prior to visit.     ROS Review of Systems  Constitutional: Negative for activity change, appetite change and fever.  HENT: Negative for congestion, rhinorrhea and sore throat.   Eyes: Negative for visual disturbance.  Respiratory: Negative for cough and shortness of breath.   Cardiovascular: Negative for chest pain and palpitations.  Gastrointestinal: Negative for nausea.  Genitourinary: Negative for dysuria.    Objective:  BP (!) 111/50 (BP Location: Right Wrist, Patient Position: Sitting, Cuff Size: Large)   Pulse 78   Temp 97.4 F (36.3 C) (Oral)   Ht 5\' 6"  (1.676 m)   Wt 276 lb (125.2 kg)   SpO2 97%   BMI 44.55 kg/m   BP Readings from Last 3 Encounters:  07/15/16 (!) 111/50  06/23/16 (!) 129/57  03/20/16 129/60    Wt Readings from Last 3 Encounters:  07/15/16 276 lb (125.2 kg)  06/23/16 278 lb 6.4 oz (126.3 kg)  03/20/16 273 lb 12.8 oz (124.2 kg)     Physical Exam  Constitutional: She is oriented to person, place, and time. She appears well-developed and well-nourished. No distress.  Cardiovascular: Normal rate and regular rhythm.   Pulmonary/Chest: Breath sounds normal.  Neurological: She is alert and oriented to  person, place, and time.  Skin: Skin is warm and dry.  Psychiatric: She has a normal mood and affect.    Lab Results  Component Value Date   HGBA1C 7.0 12/21/2015   HGBA1C 6.6 09/24/2015   HGBA1C 6.8 06/25/2015    Lab Results  Component Value Date   WBC 6.9 04/03/2016   HGB 9.2 (A) 06/25/2015   HCT 29.6 (L) 04/03/2016   PLT 176 04/03/2016   GLUCOSE 104 (H) 06/23/2016   CHOL 101 12/21/2015   TRIG 113 12/21/2015   HDL 43 12/21/2015   LDLCALC 35 12/21/2015   ALT 11 06/23/2016   AST 12 06/23/2016   NA 144 06/23/2016   K 4.4 06/23/2016   CL 105 06/23/2016   CREATININE 1.43 (H) 06/23/2016   BUN 30 (H) 06/23/2016   CO2 25 06/23/2016   TSH 4.560 (H) 04/03/2016   INR 2.2 (H) 04/21/2016  HGBA1C 7.0 12/21/2015     Assessment & Plan:   [name] was seen today for diabetes.  Diagnoses and all orders for this visit:  Chronic atrial fibrillation ([place]) -     CoaguChek XS/INR Waived  Diabetic nephropathy associated with type 2 diabetes mellitus (HCC)  Chronic renal insufficiency, stage 3 (moderate)  Diabetic hypoglycemia (HCC)  Other orders -     Insulin Detemir (LEVEMIR FLEXTOUCH) 100 UNIT/ML Pen; Inject 65 Units into the skin daily with breakfast.      I have changed [name]'s Insulin Detemir. I am also having her maintain her fish oil-omega-3 fatty acids, alendronate, acetaminophen, Lancets, gabapentin, Cholecalciferol, HEMATINIC/FOLIC ACID, fexofenadine, JOBST RELIEF 30-40MMHG XL, colchicine, fluocinonide-emollient, Melatonin, allopurinol, torsemide, warfarin, fluticasone, ULORIC, B-D ULTRAFINE III SHORT PEN, metFORMIN, Dulaglutide, lisinopril, potassium chloride SA, diltiazem, and rosuvastatin.  Meds ordered this encounter  Medications  . Insulin Detemir (LEVEMIR FLEXTOUCH) 100 UNIT/ML Pen    Sig: Inject 65 Units into the skin daily with breakfast.    Dispense:  30 mL    Refill:  5     Follow-up: Return in about 3 months (around 10/15/2016) for diabetes  and monthly in coumadin clinic.  Claretta Fraise, M.D.

## 2016-07-22 DIAGNOSIS — E109 Type 1 diabetes mellitus without complications: Secondary | ICD-10-CM | POA: Diagnosis not present

## 2016-07-22 DIAGNOSIS — H5200 Hypermetropia, unspecified eye: Secondary | ICD-10-CM | POA: Diagnosis not present

## 2016-07-22 DIAGNOSIS — I10 Essential (primary) hypertension: Secondary | ICD-10-CM | POA: Diagnosis not present

## 2016-07-22 DIAGNOSIS — H5210 Myopia, unspecified eye: Secondary | ICD-10-CM | POA: Diagnosis not present

## 2016-07-22 LAB — HM DIABETES EYE EXAM

## 2016-07-24 DIAGNOSIS — B351 Tinea unguium: Secondary | ICD-10-CM | POA: Diagnosis not present

## 2016-07-24 DIAGNOSIS — E1151 Type 2 diabetes mellitus with diabetic peripheral angiopathy without gangrene: Secondary | ICD-10-CM | POA: Diagnosis not present

## 2016-07-24 DIAGNOSIS — L84 Corns and callosities: Secondary | ICD-10-CM | POA: Diagnosis not present

## 2016-08-05 DIAGNOSIS — Z0100 Encounter for examination of eyes and vision without abnormal findings: Secondary | ICD-10-CM | POA: Diagnosis not present

## 2016-08-10 DIAGNOSIS — J449 Chronic obstructive pulmonary disease, unspecified: Secondary | ICD-10-CM | POA: Diagnosis not present

## 2016-08-19 ENCOUNTER — Encounter: Payer: Self-pay | Admitting: Pharmacist

## 2016-09-02 ENCOUNTER — Ambulatory Visit (INDEPENDENT_AMBULATORY_CARE_PROVIDER_SITE_OTHER): Payer: Commercial Managed Care - HMO | Admitting: Pharmacist

## 2016-09-02 DIAGNOSIS — I482 Chronic atrial fibrillation, unspecified: Secondary | ICD-10-CM

## 2016-09-02 DIAGNOSIS — Z23 Encounter for immunization: Secondary | ICD-10-CM

## 2016-09-02 LAB — COAGUCHEK XS/INR WAIVED
INR: 2 — AB (ref 0.9–1.1)
PROTHROMBIN TIME: 23.8 s

## 2016-09-09 DIAGNOSIS — J449 Chronic obstructive pulmonary disease, unspecified: Secondary | ICD-10-CM | POA: Diagnosis not present

## 2016-10-01 ENCOUNTER — Other Ambulatory Visit: Payer: Self-pay | Admitting: Family

## 2016-10-08 ENCOUNTER — Encounter: Payer: Self-pay | Admitting: Pharmacist

## 2016-10-10 ENCOUNTER — Other Ambulatory Visit: Payer: Self-pay | Admitting: Family Medicine

## 2016-10-10 ENCOUNTER — Other Ambulatory Visit: Payer: Self-pay | Admitting: Family

## 2016-10-10 DIAGNOSIS — J449 Chronic obstructive pulmonary disease, unspecified: Secondary | ICD-10-CM | POA: Diagnosis not present

## 2016-10-15 ENCOUNTER — Other Ambulatory Visit: Payer: Self-pay | Admitting: *Deleted

## 2016-10-15 ENCOUNTER — Ambulatory Visit: Payer: Self-pay | Admitting: Family Medicine

## 2016-10-15 MED ORDER — WARFARIN SODIUM 5 MG PO TABS: ORAL_TABLET | ORAL | 1 refills | Status: DC

## 2016-10-20 ENCOUNTER — Other Ambulatory Visit: Payer: Self-pay | Admitting: Family Medicine

## 2016-10-21 ENCOUNTER — Ambulatory Visit: Payer: Self-pay | Admitting: Family Medicine

## 2016-10-26 ENCOUNTER — Other Ambulatory Visit: Payer: Self-pay | Admitting: Family Medicine

## 2016-10-28 ENCOUNTER — Ambulatory Visit (INDEPENDENT_AMBULATORY_CARE_PROVIDER_SITE_OTHER): Payer: Commercial Managed Care - HMO | Admitting: Family Medicine

## 2016-10-28 ENCOUNTER — Encounter: Payer: Self-pay | Admitting: Family Medicine

## 2016-10-28 VITALS — BP 119/74 | HR 82 | Temp 97.2°F | Ht 66.0 in | Wt 290.5 lb

## 2016-10-28 DIAGNOSIS — E039 Hypothyroidism, unspecified: Secondary | ICD-10-CM | POA: Diagnosis not present

## 2016-10-28 DIAGNOSIS — N183 Chronic kidney disease, stage 3 unspecified: Secondary | ICD-10-CM

## 2016-10-28 DIAGNOSIS — I270 Primary pulmonary hypertension: Secondary | ICD-10-CM

## 2016-10-28 DIAGNOSIS — E1121 Type 2 diabetes mellitus with diabetic nephropathy: Secondary | ICD-10-CM | POA: Diagnosis not present

## 2016-10-28 DIAGNOSIS — Z794 Long term (current) use of insulin: Secondary | ICD-10-CM

## 2016-10-28 DIAGNOSIS — I5022 Chronic systolic (congestive) heart failure: Secondary | ICD-10-CM

## 2016-10-28 DIAGNOSIS — E119 Type 2 diabetes mellitus without complications: Secondary | ICD-10-CM

## 2016-10-28 DIAGNOSIS — I482 Chronic atrial fibrillation, unspecified: Secondary | ICD-10-CM

## 2016-10-28 LAB — COAGUCHEK XS/INR WAIVED
INR: 2.7 — ABNORMAL HIGH (ref 0.9–1.1)
Prothrombin Time: 32.8 s

## 2016-10-28 LAB — BAYER DCA HB A1C WAIVED: HB A1C (BAYER DCA - WAIVED): 5.6 % (ref <7.0)

## 2016-10-28 NOTE — Progress Notes (Signed)
Subjective:  Patient ID: [name], female    DOB: 1946/11/11  Age: 70 y.o. MRN: [id number]  CC: Diabetes (followup)   HPI ADYLINE HUBERTY presents for  follow-up of hypertension. Patient has no history of headache chest pain or shortness of breath or recent cough. Patient also denies symptoms of TIA such as numbness weakness lateralizing. Patient checks  blood pressure at home and has not had any elevated readings recently. Patient denies side effects from his medication. States taking it regularly.  Patient also  in for follow-up of elevated cholesterol. Doing well without complaints on current medication. Denies side effects of statin including myalgia and arthralgia and nausea. Also in today for liver function testing. Currently no chest pain, shortness of breath or other cardiovascular related symptoms noted.  Follow-up of diabetes. Patient does check blood sugar at home. Logs reviewed. Readings run between 90-130 fasting and 140-180 PP with occasional 200.  Patient denies symptoms such as polyuria, polydipsia, excessive hunger, nausea Glucose dropped to 54 yesterday. Came back up when she ate peanut butter. No other hypoglycemic spells noted. Medications as noted below. Taking them regularly without complication/adverse reaction being reported today.    History Myesha has a past medical history of Anemia; Arthritis; Asthma; Atrial fibrillation ([place]); CAD (coronary artery disease); Cataract; CHF (congestive heart failure) ([place]); COPD (chronic obstructive pulmonary disease) ([place]); Diabetes mellitus; Gait instability; GERD (gastroesophageal reflux disease); Hyperlipidemia; Hypertension; Obesity; Oxygen dependent; Sleep apnea, obstructive (2008); and Stroke [facility]).   She has a past surgical history that includes Cataract extraction w/ intraocular lens  implant, bilateral; Appendectomy; Cholecystectomy; Carpal tunnel release; Cervical spine surgery; Cesarean section; Umbilical hernia  repair; Knee arthroscopy; Colonoscopy (11/22/2002); Upper gastrointestinal endoscopy (11/22/2002); Colonoscopy (11/26/2005); Femur IM nail (Left, 05/08/2013); Hip surgery (Left); and Eye surgery.   Her family history includes Alzheimer's disease in her father and mother; Asthma in her daughter and grandchild; Breast cancer in her sister; Cancer in her brother, mother, and sister; Colon cancer in her mother; Diabetes in her daughter and mother; Heart attack in her mother and sister; Heart disease in her brother, mother, and sister; Kidney disease in her mother; Migraines in her sister.She reports that she has never smoked. She has never used smokeless tobacco. She reports that she does not drink alcohol or use drugs.  Current Outpatient Prescriptions on File Prior to Visit  Medication Sig Dispense Refill  . acetaminophen (TYLENOL) 325 MG tablet Take 2 tablets (650 mg total) by mouth every 6 (six) hours as needed for pain or fever.    Marland Kitchen alendronate (FOSAMAX) 70 MG tablet Take 1 tablet (70 mg total) by mouth every 7 (seven) days. 4 tablet 2  . allopurinol (ZYLOPRIM) 100 MG tablet Take 1 tablet (100 mg total) by mouth daily. 90 tablet 1  . B-D ULTRAFINE III SHORT PEN 31G X 8 MM MISC     . Cholecalciferol (CVS VITAMIN D3) 1000 UNITS CHEW Chew 1 tablet (1,000 Units total) by mouth daily. 30 tablet   . colchicine 0.6 MG tablet 1.2 mg then 0.6 mg one hour for pain 30 tablet 1  . diltiazem (CARDIZEM CD) 180 MG 24 hr capsule TAKE 1 CAPSULE (180 MG TOTAL) BY MOUTH DAILY. 90 capsule 0  . Dulaglutide (TRULICITY) 8.61 UO/3.7GB SOPN Inject 0.75 mg into the skin once a week. 4 pen 5  . Elastic Bandages & Supports (JOBST RELIEF 30-40MMHG XL) MISC Wear daily for swelling in legs 2 each 11  . fexofenadine (ALLEGRA) 180 MG  tablet Take 1 tablet (180 mg total) by mouth daily. 30 tablet 11  . fish oil-omega-3 fatty acids 1000 MG capsule Take 2 g by mouth daily.      . fluocinonide-emollient (LIDEX-E) 0.05 % cream Apply to  feet after soaking each night 60 g 5  . fluticasone (FLONASE) 50 MCG/ACT nasal spray Place 1 spray into both nostrils 2 (two) times daily as needed for allergies or rhinitis. 16 g 6  . gabapentin (NEURONTIN) 100 MG capsule Take 1 capsule (100 mg total) by mouth 3 (three) times daily. 90 capsule 0  . HEMATINIC/FOLIC ACID 034-7 MG TABS TAKE 1 TABLET DAILY 30 each 0  . Insulin Detemir (LEVEMIR FLEXTOUCH) 100 UNIT/ML Pen Inject 65 Units into the skin daily with breakfast. 30 mL 5  . Lancets MISC     . lisinopril (PRINIVIL,ZESTRIL) 5 MG tablet Take 1 tablet (5 mg total) by mouth daily. 90 tablet 1  . Melatonin 3 MG TABS Take 1 tablet (3 mg total) by mouth at bedtime as needed.  0  . metFORMIN (GLUCOPHAGE) 850 MG tablet TAKE 1 TABLET (850 MG TOTAL) BY MOUTH 2 (TWO) TIMES DAILY. 60 tablet 1  . potassium chloride SA (KLOR-CON M20) 20 MEQ tablet Take 1 tablet (20 mEq total) by mouth daily. 90 tablet 2  . rosuvastatin (CRESTOR) 10 MG tablet TAKE 1 TABLET (10 MG TOTAL) BY MOUTH AT BEDTIME. 90 tablet 0  . torsemide (DEMADEX) 20 MG tablet TAKE 3 TABLETS (60 MG TOTAL) BY MOUTH 2 (TWO) TIMES DAILY. WITH BREAKFAST AND AGAIN AROUND 2 TO 3 PM 180 tablet 2  . ULORIC 40 MG tablet TAKE 1 TABLET (40 MG TOTAL) BY MOUTH DAILY. 90 tablet 0  . warfarin (COUMADIN) 5 MG tablet TAKE 1 TABLET BY MOUTH EVERY EVENING EXCEPT MONDAY. NO DOSE MONDAY 90 tablet 1   No current facility-administered medications on file prior to visit.     ROS Review of Systems  Constitutional: Negative for activity change, appetite change and fever.  HENT: Negative for congestion, rhinorrhea and sore throat.   Eyes: Negative for visual disturbance.  Respiratory: Negative for cough and shortness of breath.   Cardiovascular: Negative for chest pain and palpitations.  Gastrointestinal: Negative for abdominal pain, diarrhea and nausea.  Genitourinary: Negative for dysuria.  Musculoskeletal: Positive for arthralgias (knees), back pain and joint  swelling (left ankle). Negative for myalgias.    Objective:  BP 119/74   Pulse 82   Temp 97.2 F (36.2 C) (Oral)   Ht 5' 6"  (1.676 m)   Wt 290 lb 8 oz (131.8 kg)   BMI 46.89 kg/m   BP Readings from Last 3 Encounters:  10/28/16 119/74  07/15/16 (!) 111/50  06/23/16 (!) 129/57    Wt Readings from Last 3 Encounters:  10/28/16 290 lb 8 oz (131.8 kg)  07/15/16 276 lb (125.2 kg)  06/23/16 278 lb 6.4 oz (126.3 kg)     Physical Exam  Constitutional: She is oriented to person, place, and time. She appears well-developed and well-nourished. No distress.  Obese   HENT:  Head: Normocephalic and atraumatic.  Right Ear: External ear normal.  Left Ear: External ear normal.  Nose: Nose normal.  Mouth/Throat: Oropharynx is clear and moist.  Eyes: Conjunctivae and EOM are normal. Pupils are equal, round, and reactive to light.  Neck: Normal range of motion. Neck supple. No thyromegaly present.  Cardiovascular: Normal rate, regular rhythm and normal heart sounds.   No murmur heard. Pulmonary/Chest: Effort normal and  breath sounds normal. No respiratory distress. She has no wheezes. She has no rales.  Abdominal: Soft. Bowel sounds are normal. She exhibits no distension. There is no tenderness.  Musculoskeletal: She exhibits tenderness (lumbar midline. Left knee).  Antalgic gait    Lymphadenopathy:    She has no cervical adenopathy.  Neurological: She is alert and oriented to person, place, and time. She has normal reflexes.  Skin: Skin is warm and dry.  Psychiatric: She has a normal mood and affect. Her behavior is normal. Judgment and thought content normal.    Lab Results  Component Value Date   HGBA1C 7.0 12/21/2015   HGBA1C 6.6 09/24/2015   HGBA1C 6.8 06/25/2015    Lab Results  Component Value Date   WBC 6.9 04/03/2016   HGB 9.2 (A) 06/25/2015   HCT 29.6 (L) 04/03/2016   PLT 176 04/03/2016   GLUCOSE 104 (H) 06/23/2016   CHOL 101 12/21/2015   TRIG 113 12/21/2015    HDL 43 12/21/2015   LDLCALC 35 12/21/2015   ALT 11 06/23/2016   AST 12 06/23/2016   NA 144 06/23/2016   K 4.4 06/23/2016   CL 105 06/23/2016   CREATININE 1.43 (H) 06/23/2016   BUN 30 (H) 06/23/2016   CO2 25 06/23/2016   TSH 4.560 (H) 04/03/2016   INR 2.7 (H) 10/28/2016   HGBA1C 7.0 12/21/2015   MICROALBUR 20 03/22/2015    Nm Bone Scan Whole Body  Result Date: 10/10/2015 CLINICAL DATA:  Back pain.  Fall 2 years ago. EXAM: NUCLEAR MEDICINE WHOLE BODY BONE SCAN TECHNIQUE: Whole body anterior and posterior images were obtained approximately 3 hours after intravenous injection of radiopharmaceutical. RADIOPHARMACEUTICALS:  25.7 mCi Technetium-53mMDP IV COMPARISON:  CT chest 08/28/2015. Ultrasound kidneys 08/11/2015. Lumbar spine series 07/20/2015. FINDINGS: Right renal atrophy again noted. Bilateral renal function and excretion is present. No focal significant bony abnormalities identified. Minimal increase activity noted about the shoulders, both sternoclavicular joints, both knees, and about the ankle and feet consistent with degenerative change. IMPRESSION: Degenerative changes otherwise no significant abnormality. Electronically Signed   By: TMarcello Moores Register   On: 10/10/2015 13:04    Assessment & Plan:   MLaianawas seen today for diabetes.  Diagnoses and all orders for this visit:  Diabetic nephropathy associated with type 2 diabetes mellitus [place] -     Microalbumin / creatinine urine ratio -     Urinalysis -     Lipid panel -     CMP14+EGFR -     Bayer DCA Hb A1c Waived  PULMONARY HYPERTENSION  Severe obesity (BMI >= 40) (HCC)  Long-term current use of insulin for diabetes mellitus (HCC)  Chronic renal impairment, stage 3 (moderate)  Chronic systolic congestive heart failure (HCC)  Chronic atrial fibrillation ([place] -     CoaguChek XS/INR Waived     Medication List       Accurate as of 10/28/16  6:52 PM. Always use your most recent med list.            acetaminophen 325 MG tablet Commonly known as:  TYLENOL Take 2 tablets (650 mg total) by mouth every 6 (six) hours as needed for pain or fever.   alendronate 70 MG tablet Commonly known as:  FOSAMAX Take 1 tablet (70 mg total) by mouth every 7 (seven) days.   allopurinol 100 MG tablet Commonly known as:  ZYLOPRIM Take 1 tablet (100 mg total) by mouth daily.   B-D ULTRAFINE III SHORT PEN 31G X  8 MM Misc Generic drug:  Insulin Pen Needle   Cholecalciferol 1000 units Chew Commonly known as:  CVS VITAMIN D3 Chew 1 tablet (1,000 Units total) by mouth daily.   colchicine 0.6 MG tablet 1.2 mg then 0.6 mg one hour for pain   diltiazem 180 MG 24 hr capsule Commonly known as:  CARDIZEM CD TAKE 1 CAPSULE (180 MG TOTAL) BY MOUTH DAILY.   Dulaglutide 0.75 MG/0.5ML Sopn Commonly known as:  TRULICITY Inject 1.44 mg into the skin once a week.   fexofenadine 180 MG tablet Commonly known as:  ALLEGRA Take 1 tablet (180 mg total) by mouth daily.   fish oil-omega-3 fatty acids 1000 MG capsule Take 2 g by mouth daily.   fluocinonide-emollient 0.05 % cream Commonly known as:  LIDEX-E Apply to feet after soaking each night   fluticasone 50 MCG/ACT nasal spray Commonly known as:  FLONASE Place 1 spray into both nostrils 2 (two) times daily as needed for allergies or rhinitis.   gabapentin 100 MG capsule Commonly known as:  NEURONTIN Take 1 capsule (100 mg total) by mouth 3 (three) times daily.   HEMATINIC/FOLIC ACID 315-4 MG Tabs Generic drug:  Ferrous Fumarate-Folic Acid TAKE 1 TABLET DAILY   Insulin Detemir 100 UNIT/ML Pen Commonly known as:  LEVEMIR FLEXTOUCH Inject 65 Units into the skin daily with breakfast.   JOBST RELIEF 30-40MMHG XL Misc Wear daily for swelling in legs   Lancets Misc   lisinopril 5 MG tablet Commonly known as:  PRINIVIL,ZESTRIL Take 1 tablet (5 mg total) by mouth daily.   Melatonin 3 MG Tabs Take 1 tablet (3 mg total) by mouth at bedtime as  needed.   metFORMIN 850 MG tablet Commonly known as:  GLUCOPHAGE TAKE 1 TABLET (850 MG TOTAL) BY MOUTH 2 (TWO) TIMES DAILY.   potassium chloride SA 20 MEQ tablet Commonly known as:  KLOR-CON M20 Take 1 tablet (20 mEq total) by mouth daily.   rosuvastatin 10 MG tablet Commonly known as:  CRESTOR TAKE 1 TABLET (10 MG TOTAL) BY MOUTH AT BEDTIME.   torsemide 20 MG tablet Commonly known as:  DEMADEX TAKE 3 TABLETS (60 MG TOTAL) BY MOUTH 2 (TWO) TIMES DAILY. WITH BREAKFAST AND AGAIN AROUND 2 TO 3 PM   ULORIC 40 MG tablet Generic drug:  febuxostat TAKE 1 TABLET (40 MG TOTAL) BY MOUTH DAILY.   warfarin 5 MG tablet Commonly known as:  COUMADIN TAKE 1 TABLET BY MOUTH EVERY EVENING EXCEPT MONDAY. NO DOSE MONDAY        Follow-up: No Follow-up on file.  Claretta Fraise, M.D.

## 2016-10-29 LAB — CMP14+EGFR
ALK PHOS: 133 IU/L — AB (ref 39–117)
ALT: 13 IU/L (ref 0–32)
AST: 19 IU/L (ref 0–40)
Albumin/Globulin Ratio: 1.5 (ref 1.2–2.2)
Albumin: 3.8 g/dL (ref 3.5–4.8)
BILIRUBIN TOTAL: 0.4 mg/dL (ref 0.0–1.2)
BUN/Creatinine Ratio: 17 (ref 12–28)
BUN: 17 mg/dL (ref 8–27)
CHLORIDE: 109 mmol/L — AB (ref 96–106)
CO2: 22 mmol/L (ref 18–29)
Calcium: 8.7 mg/dL (ref 8.7–10.3)
Creatinine, Ser: 0.98 mg/dL (ref 0.57–1.00)
GFR calc Af Amer: 67 mL/min/{1.73_m2} (ref >59)
GFR calc non Af Amer: 58 mL/min/{1.73_m2} — ABNORMAL LOW (ref >59)
GLUCOSE: 47 mg/dL — AB (ref 65–99)
Globulin, Total: 2.5 g/dL (ref 1.5–4.5)
Potassium: 4.4 mmol/L (ref 3.5–5.2)
Sodium: 147 mmol/L — ABNORMAL HIGH (ref 134–144)
Total Protein: 6.3 g/dL (ref 6.0–8.5)

## 2016-10-29 LAB — LIPID PANEL
CHOL/HDL RATIO: 2.2 ratio (ref 0.0–4.4)
Cholesterol, Total: 98 mg/dL — ABNORMAL LOW (ref 100–199)
HDL: 44 mg/dL (ref >39)
LDL Calculated: 36 mg/dL (ref 0–99)
Triglycerides: 89 mg/dL (ref 0–149)
VLDL CHOLESTEROL CAL: 18 mg/dL (ref 5–40)

## 2016-10-30 ENCOUNTER — Other Ambulatory Visit: Payer: Self-pay | Admitting: Family Medicine

## 2016-10-30 LAB — SPECIMEN STATUS REPORT

## 2016-10-30 LAB — TSH: TSH: 4.65 u[IU]/mL — AB (ref 0.450–4.500)

## 2016-10-30 LAB — T4, FREE: FREE T4: 1.09 ng/dL (ref 0.82–1.77)

## 2016-11-03 ENCOUNTER — Other Ambulatory Visit: Payer: Self-pay | Admitting: Family Medicine

## 2016-11-03 MED ORDER — LEVOTHYROXINE SODIUM 50 MCG PO TABS: 50.0000 ug | ORAL_TABLET | Freq: Every day | ORAL | 3 refills | Status: DC

## 2016-11-04 ENCOUNTER — Telehealth: Payer: Self-pay | Admitting: Family Medicine

## 2016-11-04 NOTE — Telephone Encounter (Signed)
Looks like Rx for #[id number] was sent to [facility]09[facility]16 by Dr Livia Snellen.  Called [facility] and confirmed with Abe People that they have Rx on hold and will fill it today.  Patient notified

## 2016-11-09 DIAGNOSIS — J449 Chronic obstructive pulmonary disease, unspecified: Secondary | ICD-10-CM | POA: Diagnosis not present

## 2016-11-13 ENCOUNTER — Telehealth: Payer: Self-pay | Admitting: *Deleted

## 2016-11-13 NOTE — Telephone Encounter (Signed)
I called patient to see if she is wanting to switch pharmacies to [place]. Patient states that she did not want to switch pharmacies at this time.

## 2016-11-14 ENCOUNTER — Other Ambulatory Visit: Payer: Self-pay | Admitting: Family Medicine

## 2016-11-14 DIAGNOSIS — R7989 Other specified abnormal findings of blood chemistry: Secondary | ICD-10-CM

## 2016-11-18 ENCOUNTER — Telehealth: Payer: Self-pay | Admitting: Family Medicine

## 2016-11-20 ENCOUNTER — Other Ambulatory Visit: Payer: Self-pay | Admitting: Family Medicine

## 2016-11-20 ENCOUNTER — Encounter: Payer: Self-pay | Admitting: Pharmacist

## 2016-11-20 ENCOUNTER — Ambulatory Visit (INDEPENDENT_AMBULATORY_CARE_PROVIDER_SITE_OTHER): Payer: Commercial Managed Care - HMO | Admitting: Pharmacist

## 2016-11-20 VITALS — BP 110/60 | HR 66 | Ht 65.5 in | Wt 271.5 lb

## 2016-11-20 DIAGNOSIS — Z0000 Encounter for general adult medical examination without abnormal findings: Secondary | ICD-10-CM | POA: Diagnosis not present

## 2016-11-20 DIAGNOSIS — R791 Abnormal coagulation profile: Secondary | ICD-10-CM

## 2016-11-20 DIAGNOSIS — I482 Chronic atrial fibrillation, unspecified: Secondary | ICD-10-CM

## 2016-11-20 DIAGNOSIS — Z1159 Encounter for screening for other viral diseases: Secondary | ICD-10-CM

## 2016-11-20 LAB — COAGUCHEK XS/INR WAIVED
INR: 6.4 (ref 0.9–1.1)
INR: 6.4 (ref 0.9–1.1)
PROTHROMBIN TIME: 76.4 s
Prothrombin Time: 76.2 s

## 2016-11-20 LAB — FINGERSTICK HEMOGLOBIN: HEMOGLOBIN: 9.7 g/dL — AB (ref 11.1–15.9)

## 2016-11-20 NOTE — Patient Instructions (Signed)
Anticoagulation Dose Instructions as of 11/20/2016      [name] Tue Wed Thu Fri Sat   11/20/2016 thru 11/22/2016     Hold Hold 2.5 mg   11/23/2016 thru 11/29/2016 2.5 mg 5 mg 2.5 mg 5 mg 2.5 mg 5 mg 2.5 mg    Description   No warfarin today or tomorrow.  Then decrease warfarin 5mg  dose to 1 tablet on Mondays, Wednesdays and Friday.  Take 1/2 tablet all other days.      [name] , Thank you for taking time to come for your Medicare Wellness Visit. I appreciate your ongoing commitment to your health goals. Please review the following plan we discussed and let me know if I can assist you in the future.   These are the goals we discussed:  You have lost about 7 lbs since starting Trulicity - continue to take weekly.   Try to increase physical activity - walking or doing chair exercises.  Continue to be active with [address]   Look over Advanced Directives packet - call me if you have questions.   Increase non-starchy vegetables - carrots, green bean, squash, zucchini, tomatoes, onions, peppers, spinach and other green leafy vegetables, cabbage, lettuce, cucumbers, asparagus, okra (not fried), eggplant Limit sugar and processed foods (cakes, cookies, ice cream, crackers and chips) Increase fresh fruit but limit serving sizes 1/2 cup or about the size of tennis or baseball Limit red meat to no more than 1-2 times per week (serving size about the size of your palm) Choose whole grains / lean proteins - whole wheat bread, quinoa, whole grain rice (1/2 cup), fish, chicken, [place] Avoid sugar and calorie containing beverages - soda, sweet tea and juice.  Choose water or unsweetened tea instead.   This is a list of the screening recommended for you and due dates:  Health Maintenance  Topic Date Due  .  Hepatitis C: One time screening is recommended by Center for Disease Control  (CDC) for  adults born from [birth year] through 1967.   01/30/1946  . Mammogram  10/30/2016 - appointment made for  11/26/16  . Colon Cancer Screening  Stool test done 05/2016  . DEXA scan (bone density measurement)  02/20/2017  . Hemoglobin A1C  04/27/2017  . Eye exam for diabetics  07/22/2017  . Complete foot exam   10/28/2017  . Tetanus Vaccine  03/09/2022  . Flu Shot  Completed  . Shingles Vaccine  Completed  . Pneumonia vaccines  Completed  *Topic was postponed. The date shown is not the original due date.   Fall Prevention in the Home Falls can cause injuries and can affect people from all age groups. There are many simple things that you can do to make your home safe and to help prevent falls. What can I do on the outside of my home?  Regularly repair the edges of walkways and driveways and fix any cracks.  Remove high doorway thresholds.  Trim any shrubbery on the main path into your home.  Use bright outdoor lighting.  Clear walkways of debris and clutter, including tools and rocks.  Regularly check that handrails are securely fastened and in good repair. Both sides of any steps should have handrails.  Install guardrails along the edges of any raised decks or porches.  Have leaves, snow, and ice cleared regularly.  Use sand or salt on walkways during winter months.  In the garage, clean up any spills right away, including grease or oil spills. What  can I do in the bathroom?  Use night lights.  Install grab bars by the toilet and in the tub and shower. Do not use towel bars as grab bars.  Use non-skid mats or decals on the floor of the tub or shower.  If you need to sit down while you are in the shower, use a plastic, non-slip stool.  Keep the floor dry. Immediately clean up any water that spills on the floor.  Remove soap buildup in the tub or shower on a regular basis.  Attach bath mats securely with double-sided non-slip rug tape.  Remove throw rugs and other tripping hazards from the floor. What can I do in the bedroom?  Use night lights.  Make sure that a  bedside light is easy to reach.  Do not use oversized bedding that drapes onto the floor.  Have a firm chair that has side arms to use for getting dressed.  Remove throw rugs and other tripping hazards from the floor. What can I do in the kitchen?  Clean up any spills right away.  Avoid walking on wet floors.  Place frequently used items in easy-to-reach places.  If you need to reach for something above you, use a sturdy step stool that has a grab bar.  Keep electrical cables out of the way.  Do not use floor polish or wax that makes floors slippery. If you have to use wax, make sure that it is non-skid floor wax.  Remove throw rugs and other tripping hazards from the floor. What can I do in the stairways?  Do not leave any items on the stairs.  Make sure that there are handrails on both sides of the stairs. Fix handrails that are broken or loose. Make sure that handrails are as long as the stairways.  Check any carpeting to make sure that it is firmly attached to the stairs. Fix any carpet that is loose or worn.  Avoid having throw rugs at the top or bottom of stairways, or secure the rugs with carpet tape to prevent them from moving.  Make sure that you have a light switch at the top of the stairs and the bottom of the stairs. If you do not have them, have them installed. What are some other fall prevention tips?  Wear closed-toe shoes that fit well and support your feet. Wear shoes that have rubber soles or low heels.  When you use a stepladder, make sure that it is completely opened and that the sides are firmly locked. Have someone hold the ladder while you are using it. Do not climb a closed stepladder.  Add color or contrast paint or tape to grab bars and handrails in your home. Place contrasting color strips on the first and last steps.  Use mobility aids as needed, such as canes, walkers, scooters, and crutches.  Turn on lights if it is dark. Replace any light  bulbs that burn out.  Set up furniture so that there are clear paths. Keep the furniture in the same spot.  Fix any uneven floor surfaces.  Choose a carpet design that does not hide the edge of steps of a stairway.  Be aware of any and all pets.  Review your medicines with your healthcare provider. Some medicines can cause dizziness or changes in blood pressure, which increase your risk of falling. Talk with your health care provider about other ways that you can decrease your risk of falls. This may include working with a physical  therapist or trainer to improve your strength, balance, and endurance. This information is not intended to replace advice given to you by your health care provider. Make sure you discuss any questions you have with your health care provider. Document Released: 11/07/2002 Document Revised: 04/15/2016 Document Reviewed: 12/22/2014 Elsevier Interactive Patient Education  2017 [employer].

## 2016-11-20 NOTE — Progress Notes (Signed)
Patient ID: [name], female   DOB: June 29, 1946, 70 y.o.   MRN: [id number]     Subjective:   [name] is a 70 y.o. female who presents for a subseequent Medicare Annual Wellness Visit and to have INR rechecked.   [name] is widowed and lives alone though from time to time her granddaughter will stay in her home for extended periods of time.  He is retired and last worked at [employer].    She is taking warfarin 5mg  tablets - 1/2 tablet on sundays and thursdays and 1 tablet all other days. She denies missing any doses or s/s of bleeding  She also mentions that she continues to have problems sleeping.  She is taking melatonin 3mg  qhs.   Current Medications (verified) Outpatient Encounter Prescriptions as of 11/20/2016  Medication Sig  . acetaminophen (TYLENOL) 325 MG tablet Take 2 tablets (650 mg total) by mouth every 6 (six) hours as needed for pain or fever.  Marland Kitchen alendronate (FOSAMAX) 70 MG tablet Take 1 tablet (70 mg total) by mouth every 7 (seven) days.  Marland Kitchen allopurinol (ZYLOPRIM) 100 MG tablet Take 1 tablet (100 mg total) by mouth daily.  . B-D ULTRAFINE III SHORT PEN 31G X 8 MM MISC   . Cholecalciferol ([employer] VITAMIN D3) 1000 UNITS CHEW Chew 1 tablet (1,000 Units total) by mouth daily.  . colchicine 0.6 MG tablet 1.2 mg then 0.6 mg one hour for pain  . diltiazem (CARDIZEM CD) 180 MG 24 hr capsule TAKE 1 CAPSULE (180 MG TOTAL) BY MOUTH DAILY.  . Dulaglutide (TRULICITY) 1.06 YI/9.4WN SOPN Inject 0.75 mg into the skin once a week.  . Elastic Bandages & Supports (JOBST RELIEF 30-40MMHG XL) MISC Wear daily for swelling in legs  . fexofenadine (ALLEGRA) 180 MG tablet Take 1 tablet (180 mg total) by mouth daily.  . fish oil-omega-3 fatty acids 1000 MG capsule Take 2 g by mouth daily.    . fluocinonide-emollient (LIDEX-E) 0.05 % cream Apply to feet after soaking each night  . fluticasone (FLONASE) 50 MCG/ACT nasal spray Place 1 spray into both nostrils 2 (two) times  daily as needed for allergies or rhinitis.  Marland Kitchen gabapentin (NEURONTIN) 100 MG capsule Take 1 capsule (100 mg total) by mouth 3 (three) times daily.  Marland Kitchen HEMATINIC/FOLIC ACID 462-7 MG TABS TAKE 1 TABLET DAILY  . Insulin Detemir (LEVEMIR FLEXTOUCH) 100 UNIT/ML Pen Inject 65 Units into the skin daily with breakfast.  . Lancets MISC   . LEVEMIR FLEXTOUCH 100 UNIT/ML Pen INJECT 70 UNITS INTO THE SKIN DAILY WITH BREAKFAST.  Marland Kitchen levothyroxine (SYNTHROID, LEVOTHROID) 50 MCG tablet Take 1 tablet (50 mcg total) by mouth daily.  Marland Kitchen lisinopril (PRINIVIL,ZESTRIL) 5 MG tablet Take 1 tablet (5 mg total) by mouth daily.  . Melatonin 3 MG TABS Take 1 tablet (3 mg total) by mouth at bedtime as needed.  . metFORMIN (GLUCOPHAGE) 850 MG tablet TAKE 1 TABLET (850 MG TOTAL) BY MOUTH 2 (TWO) TIMES DAILY.  Marland Kitchen potassium chloride SA (KLOR-CON M20) 20 MEQ tablet Take 1 tablet (20 mEq total) by mouth daily.  . rosuvastatin (CRESTOR) 10 MG tablet TAKE 1 TABLET (10 MG TOTAL) BY MOUTH AT BEDTIME.  Marland Kitchen torsemide (DEMADEX) 20 MG tablet TAKE 3 TABLETS (60 MG TOTAL) BY MOUTH 2 (TWO) TIMES DAILY. WITH BREAKFAST AND AGAIN AROUND 2 TO 3 PM  . ULORIC 40 MG tablet TAKE 1 TABLET (40 MG TOTAL) BY MOUTH DAILY.  Marland Kitchen warfarin (COUMADIN) 5 MG tablet TAKE  1 TABLET BY MOUTH EVERY EVENING EXCEPT MONDAY. NO DOSE MONDAY   No facility-administered encounter medications on file as of 11/20/2016.     Allergies (verified) Lisinopril and Codeine   History: Past Medical History:  Diagnosis Date  . Anemia   . Arthritis   . Asthma   . Atrial fibrillation ([place])   . CAD (coronary artery disease)    LAD 60% proximal stenosis mid and distal 60% stenosis 2009.  . Cataract    had surgery  . CHF (congestive heart failure) ([place])   . COPD (chronic obstructive pulmonary disease) ([place])   . Diabetes mellitus   . Gait instability    uses cane  . GERD (gastroesophageal reflux disease)   . Hyperlipidemia   . Hypertension   . Obesity   . Oxygen dependent    2  liter per nasal cannula  . Sleep apnea, obstructive 2008  . Stroke [facility])    2 mini   Past Surgical History:  Procedure Laterality Date  . APPENDECTOMY    . CARPAL TUNNEL RELEASE     right hand  . CATARACT EXTRACTION W/ INTRAOCULAR LENS  IMPLANT, BILATERAL    . CERVICAL SPINE SURGERY     fusion  . CESAREAN SECTION    . CHOLECYSTECTOMY    . COLONOSCOPY  11/22/2002   normal - Dr.Rrourk  . COLONOSCOPY  11/26/2005   incomplete with negative BE (Dr. Rowe Pavy, [place])  . EYE SURGERY    . FEMUR IM NAIL Left 05/08/2013   Procedure: INTRAMEDULLARY (IM) NAIL FEMORAL--LONG(BIOMET SYSTEM) and Zimmer Cables;  Surgeon: Augustin Schooling, MD;  Location: [facility];  Service: Orthopedics;  Laterality: Left;  . HIP SURGERY Left   . KNEE ARTHROSCOPY     twice, left  . UMBILICAL HERNIA REPAIR    . UPPER GASTROINTESTINAL ENDOSCOPY  11/22/2002   Normal - Dr. Gala Romney   Family History  Problem Relation Age of Onset  . Colon cancer Mother   . Heart attack Mother   . Alzheimer's disease Mother     father  . Diabetes Mother   . Heart disease Mother   . Cancer Mother     colon  . Kidney disease Mother   . Cancer Sister     brain and spine  . Migraines Sister   . Alzheimer's disease Father   . Cancer Brother     stomach  . Diabetes Sister   . Cancer Sister     breast  . Heart disease Sister   . Heart attack Sister   . Breast cancer Sister   . Heart disease Brother   . Diabetes    . Heart attack    . Diabetes Daughter   . Asthma Daughter   . Asthma Grandchild    Social History   Occupational History  . Disabled    Social History Main Topics  . Smoking status: Never Smoker  . Smokeless tobacco: Never Used  . Alcohol use No  . Drug use: No  . Sexual activity: No    Do you feel safe at home?  Yes Are there smokers in your home (other than you)? No  Dietary issues and exercise activities: Current Exercise Habits: The patient does not participate in regular exercise at present, Exercise  limited by: orthopedic condition(s);respiratory conditions(s)  Current Dietary habits:  Trying to limit portion sizes which has been easier since starting Trulicity about 6 months ago.  She is also limting CHO and sugar intake.    Objective:  Today's Vitals   11/20/16 0942  BP: 110/60  Pulse: 66  Weight: 271 lb 8 oz (123.2 kg)  Height: 5' 5.5" (1.664 m)  PainSc: 5   PainLoc: Back   Body mass index is 44.49 kg/m.   INR in house was 6.4 today  HBG was 9.7 today (this is actually increased from last HBG which was 9.2 on 04/03/16)  Activities of Daily Living In your present state of health, do you have any difficulty performing the following activities: 11/20/2016 07/15/2016  Hearing? N N  Vision? N N  Difficulty concentrating or making decisions? N N  Walking or climbing stairs? N N  Dressing or bathing? N N  Doing errands, shopping? N N  Preparing Food and eating ? N -  Using the Toilet? N -  In the past six months, have you accidently leaked urine? N -  Do you have problems with loss of bowel control? N -  Managing your Medications? N -  Managing your Finances? N -  Housekeeping or managing your Housekeeping? Y -  Some recent data might be hidden     Cardiac Risk Factors include: advanced age (>11men, >33 women);diabetes mellitus;dyslipidemia;obesity (BMI >30kg/m2);sedentary lifestyle  Depression Screen PHQ 2/9 Scores 11/20/2016 10/28/2016 07/15/2016 06/23/2016  PHQ - 2 Score 0 0 0 4  PHQ- 9 Score 1 - - 14     Fall Risk Fall Risk  11/20/2016 10/28/2016 07/15/2016 03/20/2016 02/20/2016  Falls in the past year? No No No No No  Risk for fall due to : History of fall(s);Impaired mobility;Impaired balance/gait - - Impaired balance/gait;Impaired mobility -    Cognitive Function: MMSE - Mini Mental State Exam 11/20/2016 11/19/2015  Orientation to time 5 5  Orientation to Place 5 5  Registration 3 3  Attention/ Calculation 3 4  Recall 2 2  Language- name 2 objects 2 2    Language- repeat 1 1  Language- follow 3 step command 2 3  Language- read & follow direction 1 1  Write a sentence 1 0  Copy design 1 1  Total score 26 27    Immunizations and Health Maintenance Immunization History  Administered Date(s) Administered  . Influenza,inj,Quad PF,36+ Mos 08/23/2013, 09/15/2014, 09/11/2015, 09/02/2016  . Pneumococcal Conjugate-13 09/15/2014  . Pneumococcal Polysaccharide-23 11/12/2015  . Tdap 03/09/2012  . Zoster 09/03/2012   Health Maintenance Due  Topic Date Due  . Hepatitis C Screening  1946-11-25  . MAMMOGRAM  10/30/2016    Patient Care Team: Claretta Fraise, MD as PCP - General (Family Medicine) Santo Held, MD as Consulting Physician (Gynecology) Minus Breeding, MD as Consulting Physician (Cardiology)  Indicate any recent Medical Services you may have received from other than [facility] providers in the past year (date may be approximate).    Assessment:    Annual Wellness Visit  Supratherapeutic anticoagulation Low HBG - stable  Screening Tests Health Maintenance  Topic Date Due  . Hepatitis C Screening  11-04-1946  . MAMMOGRAM  10/30/2016  . COLONOSCOPY  01/02/2017 (Originally 11/27/2015)  . DEXA SCAN  02/20/2017  . HEMOGLOBIN A1C  04/27/2017  . OPHTHALMOLOGY EXAM  07/22/2017  . FOOT EXAM  10/28/2017  . TETANUS/TDAP  03/09/2022  . INFLUENZA VACCINE  Completed  . ZOSTAVAX  Completed  . PNA vac Low Risk Adult  Completed        Plan:   During the course of the visit Beyza was educated and counseled about the following appropriate screening and preventive services:   Vaccines to  include Pneumoccal, Influenza, Td, Zostavax - all vaccines are UTD  Colorectal cancer screening - declined colonoscopy; FOBT was last done 05/2016  Cardiovascular disease screening - UTD  BP is at goal today and patient is taking statin with lipids at goal  Diabetes - last A1c was 5.6%;   Bone Denisty / Osteoporosis Screening - UTD  Mammogram  - appt made today for 11/27/16  PAP - no longer required  Glaucoma screening / Diabetic Eye Exam - UTD  Nutrition counseling - patient has lost 7# since July.  Continue to limit CHO and caloric intake   Advanced Directives - informaiton packet provided  Physical Activity - Try to increase as able - chair exercises or walking  Fall prevention discussed - continue to use 3 wheeled scooter for support  Increase melatonin to 6mg  qhs.  Discussed potential of increase falls with many other medications that are used for sleep.  Patient willing to try increasing melatonin.  Will follow up with PCP is continues to have problems with sleep.  RTC in 1 week to check INR and HBG  Anticoagulation Dose Instructions as of 11/20/2016      Dorene Grebe Tue Wed Thu Fri Sat   New Dose 2.5 mg 5 mg 2.5 mg 5 mg Hold Hold 2.5 mg   Alt Week 2.5 mg 5 mg 2.5 mg 5 mg 2.5 mg 5 mg 2.5 mg    Description   No warfarin today or tomorrow.  Then decrease warfarin 5mg  dose to 1 tablet on Mondays, Wednesdays and Friday.  Take 1/2 tablet all other days.         Orders Placed This Encounter  Procedures  . Fingerstick Hemoglobin  . Protime-INR  . CBC with Differential/Platelet       Goals    None       Patient Instructions (the written plan) were given to the patient.   Cherre Robins, PharmD   11/21/2016

## 2016-11-21 LAB — CBC WITH DIFFERENTIAL/PLATELET
BASOS: 0 %
Basophils Absolute: 0 10*3/uL (ref 0.0–0.2)
EOS (ABSOLUTE): 0.1 10*3/uL (ref 0.0–0.4)
EOS: 2 %
HEMATOCRIT: 33.1 % — AB (ref 34.0–46.6)
HEMOGLOBIN: 10 g/dL — AB (ref 11.1–15.9)
IMMATURE GRANS (ABS): 0 10*3/uL (ref 0.0–0.1)
IMMATURE GRANULOCYTES: 0 %
Lymphocytes Absolute: 0.9 10*3/uL (ref 0.7–3.1)
Lymphs: 13 %
MCH: 26.3 pg — ABNORMAL LOW (ref 26.6–33.0)
MCHC: 30.2 g/dL — ABNORMAL LOW (ref 31.5–35.7)
MCV: 87 fL (ref 79–97)
MONOCYTES: 8 %
Monocytes Absolute: 0.6 10*3/uL (ref 0.1–0.9)
NEUTROS PCT: 77 %
Neutrophils Absolute: 5.1 10*3/uL (ref 1.4–7.0)
Platelets: 211 10*3/uL (ref 150–379)
RBC: 3.8 x10E6/uL (ref 3.77–5.28)
RDW: 16.7 % — AB (ref 12.3–15.4)
WBC: 6.7 10*3/uL (ref 3.4–10.8)

## 2016-11-21 LAB — PROTIME-INR
INR: 5.6 — ABNORMAL HIGH (ref 0.8–1.2)
Prothrombin Time: 52.5 s — ABNORMAL HIGH (ref 9.1–12.0)

## 2016-11-21 MED ORDER — MELATONIN 3 MG PO TABS: 6.0000 mg | ORAL_TABLET | Freq: Every evening | ORAL | 0 refills | Status: DC | PRN

## 2016-11-25 ENCOUNTER — Other Ambulatory Visit: Payer: Self-pay

## 2016-11-25 DIAGNOSIS — I959 Hypotension, unspecified: Secondary | ICD-10-CM

## 2016-11-25 MED ORDER — POTASSIUM CHLORIDE CRYS ER 20 MEQ PO TBCR: 20.0000 meq | EXTENDED_RELEASE_TABLET | Freq: Every day | ORAL | 0 refills | Status: DC

## 2016-11-25 MED ORDER — WARFARIN SODIUM 5 MG PO TABS: ORAL_TABLET | ORAL | 0 refills | Status: DC

## 2016-11-25 MED ORDER — ROSUVASTATIN CALCIUM 10 MG PO TABS: ORAL_TABLET | ORAL | 0 refills | Status: DC

## 2016-11-25 MED ORDER — LISINOPRIL 5 MG PO TABS: 5.0000 mg | ORAL_TABLET | Freq: Every day | ORAL | 0 refills | Status: DC

## 2016-11-25 MED ORDER — METFORMIN HCL 850 MG PO TABS: ORAL_TABLET | ORAL | 0 refills | Status: DC

## 2016-11-25 MED ORDER — INSULIN DETEMIR 100 UNIT/ML FLEXPEN: 65.0000 [IU] | PEN_INJECTOR | Freq: Every day | SUBCUTANEOUS | 0 refills | Status: DC

## 2016-11-25 MED ORDER — DULAGLUTIDE 0.75 MG/0.5ML ~~LOC~~ SOAJ: 0.7500 mg | SUBCUTANEOUS | 0 refills | Status: DC

## 2016-11-25 MED ORDER — FEBUXOSTAT 40 MG PO TABS: ORAL_TABLET | ORAL | 0 refills | Status: DC

## 2016-11-25 MED ORDER — DILTIAZEM HCL ER COATED BEADS 180 MG PO CP24: ORAL_CAPSULE | ORAL | 0 refills | Status: DC

## 2016-11-25 MED ORDER — INSULIN DETEMIR 100 UNIT/ML FLEXPEN: 70.0000 [IU] | PEN_INJECTOR | Freq: Every day | SUBCUTANEOUS | 1 refills | Status: DC

## 2016-11-26 ENCOUNTER — Ambulatory Visit (INDEPENDENT_AMBULATORY_CARE_PROVIDER_SITE_OTHER): Payer: Commercial Managed Care - HMO | Admitting: Pharmacist

## 2016-11-26 ENCOUNTER — Encounter: Payer: Self-pay | Admitting: *Deleted

## 2016-11-26 DIAGNOSIS — D649 Anemia, unspecified: Secondary | ICD-10-CM | POA: Diagnosis not present

## 2016-11-26 DIAGNOSIS — I482 Chronic atrial fibrillation, unspecified: Secondary | ICD-10-CM

## 2016-11-26 DIAGNOSIS — Z1231 Encounter for screening mammogram for malignant neoplasm of breast: Secondary | ICD-10-CM | POA: Diagnosis not present

## 2016-11-26 LAB — COAGUCHEK XS/INR WAIVED
INR: 2.9 — AB (ref 0.9–1.1)
PROTHROMBIN TIME: 34.9 s

## 2016-11-26 LAB — FINGERSTICK HEMOGLOBIN: Hemoglobin: 9.9 g/dL — ABNORMAL LOW (ref 11.1–15.9)

## 2016-12-03 ENCOUNTER — Other Ambulatory Visit: Payer: Self-pay | Admitting: Pharmacist

## 2016-12-10 DIAGNOSIS — J449 Chronic obstructive pulmonary disease, unspecified: Secondary | ICD-10-CM | POA: Diagnosis not present

## 2016-12-11 ENCOUNTER — Encounter: Payer: Self-pay | Admitting: Pharmacist

## 2016-12-11 ENCOUNTER — Ambulatory Visit (INDEPENDENT_AMBULATORY_CARE_PROVIDER_SITE_OTHER): Payer: Commercial Managed Care - HMO | Admitting: Pharmacist

## 2016-12-11 DIAGNOSIS — I482 Chronic atrial fibrillation, unspecified: Secondary | ICD-10-CM

## 2016-12-11 DIAGNOSIS — J449 Chronic obstructive pulmonary disease, unspecified: Secondary | ICD-10-CM | POA: Diagnosis not present

## 2016-12-11 LAB — COAGUCHEK XS/INR WAIVED
INR: 3.9 — AB (ref 0.9–1.1)
Prothrombin Time: 46.8 s

## 2016-12-25 ENCOUNTER — Ambulatory Visit (INDEPENDENT_AMBULATORY_CARE_PROVIDER_SITE_OTHER): Payer: Medicare HMO | Admitting: Pharmacist

## 2016-12-25 DIAGNOSIS — I482 Chronic atrial fibrillation, unspecified: Secondary | ICD-10-CM

## 2016-12-25 LAB — COAGUCHEK XS/INR WAIVED
INR: 2.3 — ABNORMAL HIGH (ref 0.9–1.1)
Prothrombin Time: 27.8 s

## 2016-12-29 ENCOUNTER — Other Ambulatory Visit: Payer: Self-pay | Admitting: Family Medicine

## 2016-12-31 MED ORDER — GLUCOSE BLOOD VI STRP: ORAL_STRIP | 12 refills | Status: DC

## 2016-12-31 NOTE — Telephone Encounter (Signed)
Test strips sent to pharmacy.

## 2017-01-10 DIAGNOSIS — J449 Chronic obstructive pulmonary disease, unspecified: Secondary | ICD-10-CM | POA: Diagnosis not present

## 2017-01-11 DIAGNOSIS — J449 Chronic obstructive pulmonary disease, unspecified: Secondary | ICD-10-CM | POA: Diagnosis not present

## 2017-01-19 ENCOUNTER — Other Ambulatory Visit: Payer: Self-pay | Admitting: Family Medicine

## 2017-01-26 ENCOUNTER — Other Ambulatory Visit: Payer: Self-pay | Admitting: Family Medicine

## 2017-01-28 ENCOUNTER — Ambulatory Visit: Payer: Self-pay | Admitting: Family Medicine

## 2017-02-04 ENCOUNTER — Encounter (INDEPENDENT_AMBULATORY_CARE_PROVIDER_SITE_OTHER): Payer: Self-pay

## 2017-02-04 ENCOUNTER — Encounter: Payer: Self-pay | Admitting: Family Medicine

## 2017-02-04 ENCOUNTER — Ambulatory Visit (INDEPENDENT_AMBULATORY_CARE_PROVIDER_SITE_OTHER): Payer: Medicare HMO | Admitting: Family Medicine

## 2017-02-04 ENCOUNTER — Ambulatory Visit (INDEPENDENT_AMBULATORY_CARE_PROVIDER_SITE_OTHER): Payer: Medicare HMO

## 2017-02-04 VITALS — BP 137/61 | HR 87 | Temp 97.2°F | Ht 65.5 in | Wt 267.0 lb

## 2017-02-04 DIAGNOSIS — J329 Chronic sinusitis, unspecified: Secondary | ICD-10-CM | POA: Diagnosis not present

## 2017-02-04 DIAGNOSIS — E1121 Type 2 diabetes mellitus with diabetic nephropathy: Secondary | ICD-10-CM

## 2017-02-04 DIAGNOSIS — E119 Type 2 diabetes mellitus without complications: Secondary | ICD-10-CM | POA: Diagnosis not present

## 2017-02-04 DIAGNOSIS — E782 Mixed hyperlipidemia: Secondary | ICD-10-CM | POA: Diagnosis not present

## 2017-02-04 DIAGNOSIS — Z794 Long term (current) use of insulin: Secondary | ICD-10-CM

## 2017-02-04 DIAGNOSIS — N183 Chronic kidney disease, stage 3 unspecified: Secondary | ICD-10-CM

## 2017-02-04 DIAGNOSIS — R0602 Shortness of breath: Secondary | ICD-10-CM

## 2017-02-04 DIAGNOSIS — J40 Bronchitis, not specified as acute or chronic: Secondary | ICD-10-CM

## 2017-02-04 DIAGNOSIS — E039 Hypothyroidism, unspecified: Secondary | ICD-10-CM | POA: Diagnosis not present

## 2017-02-04 DIAGNOSIS — I482 Chronic atrial fibrillation, unspecified: Secondary | ICD-10-CM

## 2017-02-04 DIAGNOSIS — I270 Primary pulmonary hypertension: Secondary | ICD-10-CM

## 2017-02-04 LAB — GLUCOSE HEMOCUE WAIVED: GLU HEMOCUE WAIVED: 145 mg/dL — AB (ref 65–99)

## 2017-02-04 LAB — COAGUCHEK XS/INR WAIVED
INR: 1.3 — AB (ref 0.9–1.1)
PROTHROMBIN TIME: 15.5 s

## 2017-02-04 LAB — VERITOR FLU A/B WAIVED
Influenza A: NEGATIVE
Influenza B: NEGATIVE

## 2017-02-04 LAB — BAYER DCA HB A1C WAIVED: HB A1C: 6.3 % (ref <7.0)

## 2017-02-04 MED ORDER — BETAMETHASONE SOD PHOS & ACET 6 (3-3) MG/ML IJ SUSP: 6.0000 mg | Freq: Once | INTRAMUSCULAR | Status: DC

## 2017-02-04 MED ORDER — LEVALBUTEROL HCL 1.25 MG/0.5ML IN NEBU
1.2500 mg | INHALATION_SOLUTION | Freq: Once | RESPIRATORY_TRACT | Status: AC
  Administered 2017-02-04: 1.25 mg via RESPIRATORY_TRACT

## 2017-02-04 MED ORDER — LEVOFLOXACIN 500 MG PO TABS: 500.0000 mg | ORAL_TABLET | Freq: Every day | ORAL | 0 refills | Status: DC

## 2017-02-04 MED ORDER — ALBUTEROL SULFATE HFA 108 (90 BASE) MCG/ACT IN AERS: 2.0000 | INHALATION_SPRAY | Freq: Four times a day (QID) | RESPIRATORY_TRACT | 0 refills | Status: DC | PRN

## 2017-02-04 NOTE — Progress Notes (Signed)
Subjective:  Patient ID: [name], female    DOB: [birth year]  Age: 71 y.o. MRN: [id number]  CC: Cough (pt here today c/o cough and wheezing x 2 days)   HPI [name] presents for 3 days of increasing  coughing and wheezing as noted above.Severe left ear pain. Congestion & rhinorrhea. Scant yellow sputum. Dyspneic walking 10 feet. Patient with history of pulmonary hypertension.   Patient also  in for follow-up of elevated cholesterol. Doing well without complaints on current medication. Denies side effects of statin including myalgia and arthralgia and nausea. Also in today for liver function testing. Currently no chest pain, shortness of breath or other cardiovascular related symptoms noted.  Follow-up of diabetes. Patient does check blood sugar at home. Readings run around 130 fasting and 110-130 Patient denies symptoms such as polyuria, polydipsia, excessive hunger, nausea No significant hypoglycemic spells noted. Medications reviewed. Pt reports taking them regularly. Pt. denies complication/adverse reaction today.  Patient presents for follow-up on  thyroid. The patient has a history of hypothyroidism for many years. It has been under active recently. This required a change of dose of levothyroxin 3 months ago. Pt. denies any change in  voice, loss of hair, heat or cold intolerance. Energy level has been adequate to good. Patient denies constipation and diarrhea. No myxedema. Medication is as noted below. Verified that pt is taking it daily on an empty stomach. Well tolerated.    Patient in for follow-up of atrial fibrillation. Patient denies any recent bouts of chest pain or palpitations. Additionally, patient is taking anticoagulants. Patient denies any recent excessive bleeding episodes including epistaxis, bleeding from the gums, genitalia, rectal bleeding or hematuria. Additionally there has been no excessive bruising.   History Denecia has a past medical history of  Anemia; Arthritis; Asthma; Atrial fibrillation ([place]); CAD (coronary artery disease); Cataract; CHF (congestive heart failure) ([place]); COPD (chronic obstructive pulmonary disease) ([place]); Diabetes mellitus; Gait instability; GERD (gastroesophageal reflux disease); Hyperlipidemia; Hypertension; Obesity; Oxygen dependent; Sleep apnea, obstructive (2008); and Stroke [facility]).   She has a past surgical history that includes Cataract extraction w/ intraocular lens  implant, bilateral; Appendectomy; Cholecystectomy; Carpal tunnel release; Cervical spine surgery; Cesarean section; Umbilical hernia repair; Knee arthroscopy; Colonoscopy (11/22/2002); Upper gastrointestinal endoscopy (11/22/2002); Colonoscopy (11/26/2005); Femur IM nail (Left, 05/08/2013); Hip surgery (Left); and Eye surgery.   Her family history includes Alzheimer's disease in her father and mother; Asthma in her daughter and grandchild; Breast cancer in her sister; Cancer in her brother, mother, sister, and sister; Colon cancer in her mother; Diabetes in her daughter, mother, and sister; Heart attack in her mother and sister; Heart disease in her brother, mother, and sister; Kidney disease in her mother; Migraines in her sister.She reports that she has never smoked. She has never used smokeless tobacco. She reports that she does not drink alcohol or use drugs.  Current Outpatient Prescriptions on File Prior to Visit  Medication Sig Dispense Refill  . acetaminophen (TYLENOL) 325 MG tablet Take 2 tablets (650 mg total) by mouth every 6 (six) hours as needed for pain or fever.    Marland Kitchen alendronate (FOSAMAX) 70 MG tablet Take 1 tablet (70 mg total) by mouth every 7 (seven) days. 4 tablet 2  . allopurinol (ZYLOPRIM) 100 MG tablet Take 1 tablet (100 mg total) by mouth daily. 90 tablet 1  . B-D ULTRAFINE III SHORT PEN 31G X 8 MM MISC     . Cholecalciferol ([employer]S VITAMIN D3) 1000 UNITS CHEW Chew 1  tablet (1,000 Units total) by mouth daily. 30 tablet   . colchicine  0.6 MG tablet 1.2 mg then 0.6 mg one hour for pain 30 tablet 1  . diltiazem (CARDIZEM CD) 180 MG 24 hr capsule TAKE 1 CAPSULE (180 MG TOTAL) BY MOUTH DAILY. 90 capsule 0  . Dulaglutide (TRULICITY) 6.62 HU/7.6LY SOPN Inject 0.75 mg into the skin once a week. 4 pen 0  . Elastic Bandages & Supports (JOBST RELIEF 30-40MMHG XL) MISC Wear daily for swelling in legs 2 each 11  . febuxostat (ULORIC) 40 MG tablet TAKE 1 TABLET (40 MG TOTAL) BY MOUTH DAILY. 90 tablet 0  . fexofenadine (ALLEGRA) 180 MG tablet Take 1 tablet (180 mg total) by mouth daily. 30 tablet 11  . fish oil-omega-3 fatty acids 1000 MG capsule Take 2 g by mouth daily.      . fluocinonide-emollient (LIDEX-E) 0.05 % cream Apply to feet after soaking each night 60 g 5  . fluticasone (FLONASE) 50 MCG/ACT nasal spray Place 1 spray into both nostrils 2 (two) times daily as needed for allergies or rhinitis. 16 g 6  . gabapentin (NEURONTIN) 100 MG capsule Take 1 capsule (100 mg total) by mouth 3 (three) times daily. 90 capsule 0  . glucose blood test strip test sugars twice daily 100 each 12  . Insulin Detemir (LEVEMIR FLEXTOUCH) 100 UNIT/ML Pen Inject 70 Units into the skin daily with breakfast. 30 mL 1  . Lancets MISC     . levothyroxine (SYNTHROID, LEVOTHROID) 50 MCG tablet Take 1 tablet (50 mcg total) by mouth daily. 90 tablet 3  . lisinopril (PRINIVIL,ZESTRIL) 5 MG tablet Take 1 tablet (5 mg total) by mouth daily. 90 tablet 0  . Melatonin 3 MG TABS Take 2 tablets (6 mg total) by mouth at bedtime as needed.  0  . metFORMIN (GLUCOPHAGE) 850 MG tablet TAKE 1 TABLET (850 MG TOTAL) BY MOUTH 2 (TWO) TIMES DAILY. 180 tablet 0  . potassium chloride SA (KLOR-CON M20) 20 MEQ tablet Take 1 tablet (20 mEq total) by mouth daily. 90 tablet 0  . rosuvastatin (CRESTOR) 10 MG tablet TAKE 1 TABLET (10 MG TOTAL) BY MOUTH AT BEDTIME. 90 tablet 0  . torsemide (DEMADEX) 20 MG tablet TAKE 3 TABLETS (60 MG TOTAL) BY MOUTH 2 (TWO) TIMES DAILY. WITH BREAKFAST AND  AGAIN AROUND 2 TO 3 PM 180 tablet 2  . warfarin (COUMADIN) 5 MG tablet TAKE 1 TABLET BY MOUTH EVERY EVENING EXCEPT MONDAY. NO DOSE MONDAY 90 tablet 0   No current facility-administered medications on file prior to visit.     ROS Review of Systems  Constitutional: Positive for activity change and fatigue. Negative for appetite change, chills, diaphoresis and fever.  HENT: Positive for ear pain, postnasal drip and rhinorrhea. Negative for congestion and sore throat.   Eyes: Negative for visual disturbance.  Respiratory: Positive for cough and shortness of breath.   Cardiovascular: Positive for chest pain and palpitations.  Gastrointestinal: Negative for abdominal pain, diarrhea and nausea.  Genitourinary: Negative for dysuria.  Musculoskeletal: Negative for arthralgias and myalgias.    Objective:  BP 137/61   Pulse 87   Temp 97.2 F (36.2 C) (Oral)   Ht 5' 5.5" (1.664 m)   Wt 267 lb (121.1 kg)   BMI 43.76 kg/m   BP Readings from Last 3 Encounters:  02/04/17 137/61  11/20/16 110/60  10/28/16 119/74    Wt Readings from Last 3 Encounters:  02/04/17 267 lb (121.1 kg)  11/20/16  271 lb 8 oz (123.2 kg)  10/28/16 290 lb 8 oz (131.8 kg)     Physical Exam  Constitutional: She is oriented to person, place, and time. She appears well-developed and well-nourished. She appears distressed.  HENT:  Head: Normocephalic and atraumatic.  Right Ear: External ear normal.  Left Ear: External ear normal.  Nose: Nose normal.  Mouth/Throat: Oropharynx is clear and moist.  Eyes: Conjunctivae and EOM are normal. Pupils are equal, round, and reactive to light.  Neck: Normal range of motion. Neck supple. No thyromegaly present.  Cardiovascular: Normal rate, regular rhythm and normal heart sounds.   No murmur heard. Pulmonary/Chest: She is in respiratory distress. She has wheezes. She has no rales.  Abdominal: Soft. Bowel sounds are normal. She exhibits no distension. There is no tenderness.    Lymphadenopathy:    She has no cervical adenopathy.  Neurological: She is alert and oriented to person, place, and time. She has normal reflexes.  Skin: Skin is warm and dry.  Psychiatric: She has a normal mood and affect. Her behavior is normal. Judgment and thought content normal.       Assessment & Plan:   Shaunna was seen today for cough.  Diagnoses and all orders for this visit:  Sinobronchitis -     CMP14+EGFR -     CBC with Differential/Platelet -     DG Chest 2 View; Future -     levalbuterol (XOPENEX) nebulizer solution 1.25 mg; Take 1.25 mg by nebulization once. -     betamethasone acetate-betamethasone sodium phosphate (CELESTONE) injection 6 mg; Inject 1 mL (6 mg total) into the muscle once. -     Veritor Flu A/B Waived  Shortness of breath -     CMP14+EGFR -     CBC with Differential/Platelet -     DG Chest 2 View; Future -     levalbuterol (XOPENEX) nebulizer solution 1.25 mg; Take 1.25 mg by nebulization once. -     betamethasone acetate-betamethasone sodium phosphate (CELESTONE) injection 6 mg; Inject 1 mL (6 mg total) into the muscle once. -     Veritor Flu A/B Waived  PULMONARY HYPERTENSION -     CMP14+EGFR -     CBC with Differential/Platelet  Chronic atrial fibrillation (HCC) -     CMP14+EGFR -     CBC with Differential/Platelet -     CoaguChek XS/INR Waived  Diabetic nephropathy associated with type 2 diabetes mellitus (HCC) -     Bayer DCA Hb A1c Waived -     CMP14+EGFR -     CBC with Differential/Platelet -     Glucose Hemocue Waived  Chronic renal impairment, stage 3 (moderate) -     CMP14+EGFR -     CBC with Differential/Platelet  Mixed hyperlipidemia -     CMP14+EGFR -     CBC with Differential/Platelet  Long-term current use of insulin for diabetes mellitus (HCC) -     CMP14+EGFR -     CBC with Differential/Platelet  Hypothyroidism, unspecified type -     TSH + free T4  Other orders -     levofloxacin (LEVAQUIN) 500 MG tablet;  Take 1 tablet (500 mg total) by mouth daily. For 10 days -     albuterol (PROVENTIL HFA;VENTOLIN HFA) 108 (90 Base) MCG/ACT inhaler; Inhale 2 puffs into the lungs every 6 (six) hours as needed for wheezing or shortness of breath.   I have discontinued Ms. Edgell's HEMATINIC/FOLIC ACID. I am also having her  start on levofloxacin and albuterol. Additionally, I am having her maintain her fish oil-omega-3 fatty acids, alendronate, acetaminophen, Lancets, gabapentin, Cholecalciferol, fexofenadine, JOBST RELIEF 30-40MMHG XL, colchicine, fluocinonide-emollient, allopurinol, torsemide, fluticasone, B-D ULTRAFINE III SHORT PEN, levothyroxine, Melatonin, diltiazem, potassium chloride SA, Insulin Detemir, lisinopril, metFORMIN, rosuvastatin, Dulaglutide, febuxostat, warfarin, and glucose blood. We will continue to administer levalbuterol and betamethasone acetate-betamethasone sodium phosphate.  Meds ordered this encounter  Medications  . levalbuterol (XOPENEX) nebulizer solution 1.25 mg  . betamethasone acetate-betamethasone sodium phosphate (CELESTONE) injection 6 mg  . levofloxacin (LEVAQUIN) 500 MG tablet    Sig: Take 1 tablet (500 mg total) by mouth daily. For 10 days    Dispense:  10 tablet    Refill:  0  . albuterol (PROVENTIL HFA;VENTOLIN HFA) 108 (90 Base) MCG/ACT inhaler    Sig: Inhale 2 puffs into the lungs every 6 (six) hours as needed for wheezing or shortness of breath.    Dispense:  1 Inhaler    Refill:  0   Patient was administered Celestone 1 mL IM. She was also given a Xopenex nebulizer treatment. Subsequently she was walked around the office and found to have a pulse ox remaining stable at 92-97. However her wheezing did not break area a second nebulizer treatment was given. This seemed to start to break up the wheezing somewhat. However, she remained somewhat short of breath with diffuse rhonchi. She was discharged with orders to return for change in her status including increasing  shortness of breath cough or fever.  Follow-up: Return in about 3 months (around 05/07/2017), or if symptoms worsen or fail to improve.  Claretta Fraise, M.D.

## 2017-02-05 LAB — CMP14+EGFR
A/G RATIO: 1.6 (ref 1.2–2.2)
ALK PHOS: 118 IU/L — AB (ref 39–117)
ALT: 8 IU/L (ref 0–32)
AST: 16 IU/L (ref 0–40)
Albumin: 4.1 g/dL (ref 3.5–4.8)
BUN/Creatinine Ratio: 16 (ref 12–28)
BUN: 19 mg/dL (ref 8–27)
Bilirubin Total: 0.6 mg/dL (ref 0.0–1.2)
CALCIUM: 9 mg/dL (ref 8.7–10.3)
CO2: 24 mmol/L (ref 18–29)
Chloride: 99 mmol/L (ref 96–106)
Creatinine, Ser: 1.2 mg/dL — ABNORMAL HIGH (ref 0.57–1.00)
GFR calc Af Amer: 52 mL/min/{1.73_m2} — ABNORMAL LOW (ref >59)
GFR, EST NON AFRICAN AMERICAN: 45 mL/min/{1.73_m2} — AB (ref >59)
GLOBULIN, TOTAL: 2.6 g/dL (ref 1.5–4.5)
Glucose: 120 mg/dL — ABNORMAL HIGH (ref 65–99)
POTASSIUM: 4.3 mmol/L (ref 3.5–5.2)
SODIUM: 141 mmol/L (ref 134–144)
Total Protein: 6.7 g/dL (ref 6.0–8.5)

## 2017-02-05 LAB — CBC WITH DIFFERENTIAL/PLATELET
Basophils Absolute: 0 10*3/uL (ref 0.0–0.2)
Basos: 1 %
EOS (ABSOLUTE): 0 10*3/uL (ref 0.0–0.4)
Eos: 1 %
Hematocrit: 32.4 % — ABNORMAL LOW (ref 34.0–46.6)
Hemoglobin: 10.3 g/dL — ABNORMAL LOW (ref 11.1–15.9)
IMMATURE GRANS (ABS): 0 10*3/uL (ref 0.0–0.1)
IMMATURE GRANULOCYTES: 0 %
LYMPHS: 12 %
Lymphocytes Absolute: 0.6 10*3/uL — ABNORMAL LOW (ref 0.7–3.1)
MCH: 27.8 pg (ref 26.6–33.0)
MCHC: 31.8 g/dL (ref 31.5–35.7)
MCV: 88 fL (ref 79–97)
MONOS ABS: 0.6 10*3/uL (ref 0.1–0.9)
Monocytes: 12 %
NEUTROS PCT: 74 %
Neutrophils Absolute: 3.7 10*3/uL (ref 1.4–7.0)
PLATELETS: 166 10*3/uL (ref 150–379)
RBC: 3.7 x10E6/uL — AB (ref 3.77–5.28)
RDW: 17.4 % — AB (ref 12.3–15.4)
WBC: 4.9 10*3/uL (ref 3.4–10.8)

## 2017-02-06 LAB — TSH+FREE T4
Free T4: 1.5 ng/dL (ref 0.82–1.77)
TSH: 1.48 u[IU]/mL (ref 0.450–4.500)

## 2017-02-06 LAB — SPECIMEN STATUS REPORT

## 2017-02-07 DIAGNOSIS — J449 Chronic obstructive pulmonary disease, unspecified: Secondary | ICD-10-CM | POA: Diagnosis not present

## 2017-02-09 ENCOUNTER — Other Ambulatory Visit: Payer: Self-pay | Admitting: Family Medicine

## 2017-02-09 MED ORDER — TRUE METRIX GO GLUCOSE METER W/DEVICE KIT: 1.0000 | PACK | Freq: Two times a day (BID) | 5 refills | Status: DC

## 2017-02-10 DIAGNOSIS — J440 Chronic obstructive pulmonary disease with acute lower respiratory infection: Secondary | ICD-10-CM | POA: Diagnosis not present

## 2017-02-10 DIAGNOSIS — J441 Chronic obstructive pulmonary disease with (acute) exacerbation: Secondary | ICD-10-CM | POA: Diagnosis not present

## 2017-02-10 DIAGNOSIS — I4891 Unspecified atrial fibrillation: Secondary | ICD-10-CM | POA: Diagnosis not present

## 2017-02-10 DIAGNOSIS — I509 Heart failure, unspecified: Secondary | ICD-10-CM | POA: Diagnosis not present

## 2017-02-10 DIAGNOSIS — I110 Hypertensive heart disease with heart failure: Secondary | ICD-10-CM | POA: Diagnosis not present

## 2017-02-10 DIAGNOSIS — E119 Type 2 diabetes mellitus without complications: Secondary | ICD-10-CM | POA: Diagnosis not present

## 2017-02-10 DIAGNOSIS — R197 Diarrhea, unspecified: Secondary | ICD-10-CM | POA: Diagnosis not present

## 2017-02-10 DIAGNOSIS — R1111 Vomiting without nausea: Secondary | ICD-10-CM | POA: Diagnosis not present

## 2017-02-10 DIAGNOSIS — R0682 Tachypnea, not elsewhere classified: Secondary | ICD-10-CM | POA: Diagnosis not present

## 2017-02-10 DIAGNOSIS — Z7901 Long term (current) use of anticoagulants: Secondary | ICD-10-CM | POA: Diagnosis not present

## 2017-02-10 DIAGNOSIS — J209 Acute bronchitis, unspecified: Secondary | ICD-10-CM | POA: Diagnosis not present

## 2017-02-10 DIAGNOSIS — I10 Essential (primary) hypertension: Secondary | ICD-10-CM | POA: Diagnosis not present

## 2017-02-12 ENCOUNTER — Ambulatory Visit (INDEPENDENT_AMBULATORY_CARE_PROVIDER_SITE_OTHER): Payer: Medicare HMO | Admitting: Family

## 2017-02-12 ENCOUNTER — Encounter: Payer: Self-pay | Admitting: Family

## 2017-02-12 VITALS — BP 124/78 | HR 90 | Temp 97.0°F

## 2017-02-12 DIAGNOSIS — A084 Viral intestinal infection, unspecified: Secondary | ICD-10-CM

## 2017-02-12 MED ORDER — ONDANSETRON 4 MG PO TBDP: 4.0000 mg | ORAL_TABLET | Freq: Three times a day (TID) | ORAL | 0 refills | Status: DC | PRN

## 2017-02-12 NOTE — Progress Notes (Signed)
   Subjective:    Patient ID: [name], female    DOB: 1946-06-03, 71 y.o.   MRN: [id number]   Pt presents to the office today with nausea , vomiting, and diarrhea that started on 02/08/17. PT states she went to the ED on 02/10/17 and was told she was have a reaction to Levaquin and to take imodium. PT reports that she has two more days of levaquin left. Denies any fever at this time, but states she did have fever the first day she was throwing up.  Emesis   The problem occurs 2 to 4 times per day. The emesis has an appearance of stomach contents. There has been no fever. Associated symptoms include chills and sweats. Pertinent negatives include no abdominal pain.      Review of Systems  Constitutional: Positive for chills.  Gastrointestinal: Positive for vomiting. Negative for abdominal pain.  All other systems reviewed and are negative.      Objective:   Physical Exam  Constitutional: She is oriented to person, place, and time. She appears well-developed and well-nourished. No distress.  Morbid obese   HENT:  Head: Normocephalic and atraumatic.  Mouth/Throat: Oropharynx is clear and moist.  Eyes: Pupils are equal, round, and reactive to light.  Neck: Normal range of motion. Neck supple. No thyromegaly present.  Cardiovascular: Normal rate, regular rhythm, normal heart sounds and intact distal pulses.   No murmur heard. Pulmonary/Chest: Effort normal and breath sounds normal. No respiratory distress. She has no wheezes.  Abdominal: Soft. Bowel sounds are normal. She exhibits no distension. There is no tenderness.  Neurological: She is alert and oriented to person, place, and time.  Skin: Skin is warm and dry.  Psychiatric: She has a normal mood and affect. Her behavior is normal. Judgment and thought content normal.  Vitals reviewed.     BP 124/78   Pulse 90   Temp 97 F (36.1 C) (Oral)      Assessment & Plan:  1. Viral gastroenteritis -Force fluids -Bland  diet -Good hand hygiene discussed RTO prn of is symptoms do not improve - ondansetron (ZOFRAN ODT) 4 MG disintegrating tablet; Take 1 tablet (4 mg total) by mouth every 8 (eight) hours as needed for nausea or vomiting.  Dispense: 20 tablet; Refill: 0 -CBC pending  Evelina Dun, FNP

## 2017-02-12 NOTE — Patient Instructions (Signed)

## 2017-02-13 ENCOUNTER — Encounter: Payer: Self-pay | Admitting: Pharmacist Clinician (PhC)/ Clinical Pharmacy Specialist

## 2017-02-13 LAB — CBC WITH DIFFERENTIAL/PLATELET
BASOS ABS: 0 10*3/uL (ref 0.0–0.2)
BASOS: 0 %
EOS (ABSOLUTE): 0.2 10*3/uL (ref 0.0–0.4)
Eos: 2 %
Hematocrit: 33.8 % — ABNORMAL LOW (ref 34.0–46.6)
Hemoglobin: 11 g/dL — ABNORMAL LOW (ref 11.1–15.9)
IMMATURE GRANULOCYTES: 1 %
Immature Grans (Abs): 0 10*3/uL (ref 0.0–0.1)
Lymphocytes Absolute: 1.1 10*3/uL (ref 0.7–3.1)
Lymphs: 12 %
MCH: 27.8 pg (ref 26.6–33.0)
MCHC: 32.5 g/dL (ref 31.5–35.7)
MCV: 85 fL (ref 79–97)
MONOS ABS: 0.8 10*3/uL (ref 0.1–0.9)
Monocytes: 9 %
NEUTROS PCT: 76 %
Neutrophils Absolute: 6.8 10*3/uL (ref 1.4–7.0)
PLATELETS: 184 10*3/uL (ref 150–379)
RBC: 3.96 x10E6/uL (ref 3.77–5.28)
RDW: 17.3 % — AB (ref 12.3–15.4)
WBC: 8.9 10*3/uL (ref 3.4–10.8)

## 2017-02-19 ENCOUNTER — Encounter: Payer: Self-pay | Admitting: *Deleted

## 2017-03-02 ENCOUNTER — Encounter: Payer: Self-pay | Admitting: Pharmacist

## 2017-03-03 ENCOUNTER — Encounter: Payer: Self-pay | Admitting: Family Medicine

## 2017-03-09 ENCOUNTER — Ambulatory Visit (INDEPENDENT_AMBULATORY_CARE_PROVIDER_SITE_OTHER): Payer: Medicare HMO | Admitting: Pharmacist

## 2017-03-09 DIAGNOSIS — I482 Chronic atrial fibrillation, unspecified: Secondary | ICD-10-CM

## 2017-03-09 LAB — COAGUCHEK XS/INR WAIVED
INR: 1.7 — ABNORMAL HIGH (ref 0.9–1.1)
Prothrombin Time: 19.9 s

## 2017-03-10 DIAGNOSIS — J449 Chronic obstructive pulmonary disease, unspecified: Secondary | ICD-10-CM | POA: Diagnosis not present

## 2017-03-25 ENCOUNTER — Other Ambulatory Visit: Payer: Self-pay | Admitting: *Deleted

## 2017-03-25 MED ORDER — METFORMIN HCL 850 MG PO TABS: ORAL_TABLET | ORAL | 0 refills | Status: DC

## 2017-03-31 ENCOUNTER — Ambulatory Visit (INDEPENDENT_AMBULATORY_CARE_PROVIDER_SITE_OTHER): Payer: Medicare HMO | Admitting: Pharmacist

## 2017-03-31 DIAGNOSIS — I482 Chronic atrial fibrillation, unspecified: Secondary | ICD-10-CM

## 2017-03-31 LAB — COAGUCHEK XS/INR WAIVED
INR: 1.7 — ABNORMAL HIGH (ref 0.9–1.1)
PROTHROMBIN TIME: 20.5 s

## 2017-04-09 DIAGNOSIS — J449 Chronic obstructive pulmonary disease, unspecified: Secondary | ICD-10-CM | POA: Diagnosis not present

## 2017-04-15 ENCOUNTER — Telehealth: Payer: Self-pay | Admitting: *Deleted

## 2017-04-15 NOTE — Telephone Encounter (Signed)
Received message from another pt while in clinic that [name] wanted for me to call to schedule a f/u appt for her.  She is overdue (6/17) and needs a [employer].  Attempted to contact pt by phone # listed in chart.  NA and no VM.  Will attempt again later.

## 2017-04-22 NOTE — Telephone Encounter (Signed)
Contacted pt and scheduled her for f/u appt with Dr Percival Spanish in [facility] 5/30.  Pt is aware of date and time.  She will c/b before then if any concerns.

## 2017-04-27 NOTE — Progress Notes (Signed)
HPI The patient presents for evaluation of nonobstructive coronary disease and diastolic heart failure and atrial fibrillation.    She has chronic oxygen use at night.   She has no acute cardiac complaints.  She does not notice her heart beat.  She is very limited in her activities by back and joint pain.  She ambulates with a walker.  The patient denies any new symptoms such as chest discomfort, neck or arm discomfort. There has been no new PND or orthopnea. There have been no reported palpitations, presyncope or syncope.    Allergies  Allergen Reactions  . Lisinopril     R/t to kidney  . Codeine Nausea And Vomiting    Current Outpatient Prescriptions  Medication Sig Dispense Refill  . acetaminophen (TYLENOL) 325 MG tablet Take 2 tablets (650 mg total) by mouth every 6 (six) hours as needed for pain or fever.    Marland Kitchen albuterol (PROVENTIL HFA;VENTOLIN HFA) 108 (90 Base) MCG/ACT inhaler Inhale 2 puffs into the lungs every 6 (six) hours as needed for wheezing or shortness of breath. 1 Inhaler 0  . alendronate (FOSAMAX) 70 MG tablet Take 1 tablet (70 mg total) by mouth every 7 (seven) days. 4 tablet 2  . diltiazem (CARDIZEM CD) 180 MG 24 hr capsule TAKE 1 CAPSULE (180 MG TOTAL) BY MOUTH DAILY. 90 capsule 3  . Dulaglutide (TRULICITY) 8.76 OT/1.5BW SOPN Inject 0.75 mg into the skin once a week. 4 pen 0  . febuxostat (ULORIC) 40 MG tablet TAKE 1 TABLET (40 MG TOTAL) BY MOUTH DAILY. 90 tablet 0  . fish oil-omega-3 fatty acids 1000 MG capsule Take 2 g by mouth daily.      . fluocinonide-emollient (LIDEX-E) 0.05 % cream Apply to feet after soaking each night 60 g 5  . fluticasone (FLONASE) 50 MCG/ACT nasal spray Place 1 spray into both nostrils 2 (two) times daily as needed for allergies or rhinitis. 16 g 6  . Insulin Detemir (LEVEMIR FLEXTOUCH) 100 UNIT/ML Pen Inject 70 Units into the skin daily with breakfast. 30 mL 1  . levothyroxine (SYNTHROID, LEVOTHROID) 50 MCG tablet Take 1 tablet by mouth  daily.    Marland Kitchen lisinopril (PRINIVIL,ZESTRIL) 5 MG tablet Take 1 tablet (5 mg total) by mouth daily. 90 tablet 3  . metFORMIN (GLUCOPHAGE) 850 MG tablet TAKE 1 TABLET (850 MG TOTAL) BY MOUTH 2 (TWO) TIMES DAILY. 180 tablet 0  . potassium chloride SA (KLOR-CON M20) 20 MEQ tablet Take 1 tablet (20 mEq total) by mouth daily. 90 tablet 3  . torsemide (DEMADEX) 20 MG tablet Take 1 tablet (20 mg total) by mouth daily. 90 tablet 3  . warfarin (COUMADIN) 5 MG tablet TAKE 1 TABLET BY MOUTH EVERY EVENING EXCEPT MONDAY. NO DOSE MONDAY 90 tablet 0  . B-D ULTRAFINE III SHORT PEN 31G X 8 MM MISC     . Blood Glucose Monitoring Suppl (TRUE METRIX GO GLUCOSE METER) w/Device KIT 1 strip by Does not apply route 2 (two) times daily. 1 kit 5  . Cholecalciferol (CVS VITAMIN D3) 1000 UNITS CHEW Chew 1 tablet (1,000 Units total) by mouth daily. (Patient not taking: Reported on 04/29/2017) 30 tablet   . Elastic Bandages & Supports (JOBST RELIEF 30-40MMHG XL) MISC Wear daily for swelling in legs 2 each 11  . glucose blood test strip test sugars twice daily 100 each 12  . Lancets MISC     . rosuvastatin (CRESTOR) 10 MG tablet TAKE 1 TABLET (10 MG TOTAL) BY MOUTH AT  BEDTIME. (Patient not taking: Reported on 04/29/2017) 90 tablet 0   Current Facility-Administered Medications  Medication Dose Route Frequency Provider Last Rate Last Dose  . betamethasone acetate-betamethasone sodium phosphate (CELESTONE) injection 6 mg  6 mg Intramuscular Once Claretta Fraise, MD        Past Medical History:  Diagnosis Date  . Anemia   . Arthritis   . Asthma   . Atrial fibrillation ([place])   . CAD (coronary artery disease)    LAD 60% proximal stenosis mid and distal 60% stenosis 2009.  . Cataract    had surgery  . CHF (congestive heart failure) ([place])   . COPD (chronic obstructive pulmonary disease) ([place])   . Diabetes mellitus   . Gait instability    uses cane  . GERD (gastroesophageal reflux disease)   . Hyperlipidemia   . Hypertension    . Obesity   . Oxygen dependent    2 liter per nasal cannula  . Sleep apnea, obstructive 2008  . Stroke [facility])    2 mini    Past Surgical History:  Procedure Laterality Date  . APPENDECTOMY    . CARPAL TUNNEL RELEASE     right hand  . CATARACT EXTRACTION W/ INTRAOCULAR LENS  IMPLANT, BILATERAL    . CERVICAL SPINE SURGERY     fusion  . CESAREAN SECTION    . CHOLECYSTECTOMY    . COLONOSCOPY  11/22/2002   normal - Dr.Rrourk  . COLONOSCOPY  11/26/2005   incomplete with negative BE (Dr. Rowe Pavy, [place])  . EYE SURGERY    . FEMUR IM NAIL Left 05/08/2013   Procedure: INTRAMEDULLARY (IM) NAIL FEMORAL--LONG(BIOMET SYSTEM) and Zimmer Cables;  Surgeon: Augustin Schooling, MD;  Location: [place];  Service: Orthopedics;  Laterality: Left;  . HIP SURGERY Left   . KNEE ARTHROSCOPY     twice, left  . UMBILICAL HERNIA REPAIR    . UPPER GASTROINTESTINAL ENDOSCOPY  11/22/2002   Normal - Dr. Gala Romney    ROS:   As stated in the HPI and negative for all other systems.  PHYSICAL EXAM BP (!) 150/60   Pulse 86   Ht 5' 5"  (1.651 m)   Wt 269 lb (122 kg)   BMI 44.76 kg/m  GENERAL:  Well appearing NECK:  No jugular venous distention, waveform within normal limits, carotid upstroke brisk and symmetric, no bruits, no thyromegaly LUNGS:  Clear to auscultation bilaterally BACK:  No CVA tenderness CHEST:  Unremarkable HEART:  PMI not displaced or sustained,S1 and S2 within normal limits, no S3, no clicks, no rubs, no murmurs, irregular ABD:  Flat, positive bowel sounds normal in frequency in pitch, no bruits, no rebound, no guarding, no midline pulsatile mass, no hepatomegaly, no splenomegaly EXT:  2 plus pulses throughout, non pitting edema, no cyanosis no clubbing   EKG:  Atrial fibrillation, rate 86, axis within normal limits, intervals within normal limits, no acute ST-T wave changes.  04/29/2017   ASSESSMENT AND PLAN   ATRIAL FIBRILLATION:  She tolerates anticoagulation.  Ms. SAAVI MCEACHRON has a  CHA2DS2 - VASc score of 7.   No change in therapy is plannned.   HTN:  The blood pressure is increased today but this is unusual.  She will keep any eye on her BP at home.  I will make based on future readings.   Of note she has been out of a couple of meds which likely explains the increased pressures.   CAD:    She has had  no new symptoms since her last stress test in 2013.  No change in therapy is planned.   DIASTOLIC HF:  She seems to be relatively euvolemic.  No change in therapy is planned.   EDEMA:    This is chronic and for the most part non pitting.  No change in therapy is planned.

## 2017-04-28 ENCOUNTER — Encounter: Payer: Self-pay | Admitting: Cardiology

## 2017-04-29 ENCOUNTER — Encounter: Payer: Self-pay | Admitting: Cardiology

## 2017-04-29 ENCOUNTER — Ambulatory Visit (INDEPENDENT_AMBULATORY_CARE_PROVIDER_SITE_OTHER): Payer: Commercial Managed Care - HMO | Admitting: Cardiology

## 2017-04-29 VITALS — BP 150/60 | HR 86 | Ht 65.0 in | Wt 269.0 lb

## 2017-04-29 DIAGNOSIS — I959 Hypotension, unspecified: Secondary | ICD-10-CM

## 2017-04-29 DIAGNOSIS — I482 Chronic atrial fibrillation, unspecified: Secondary | ICD-10-CM

## 2017-04-29 DIAGNOSIS — I5032 Chronic diastolic (congestive) heart failure: Secondary | ICD-10-CM

## 2017-04-29 MED ORDER — LISINOPRIL 5 MG PO TABS: 5.0000 mg | ORAL_TABLET | Freq: Every day | ORAL | 3 refills | Status: DC

## 2017-04-29 MED ORDER — DILTIAZEM HCL ER COATED BEADS 180 MG PO CP24: ORAL_CAPSULE | ORAL | 3 refills | Status: DC

## 2017-04-29 MED ORDER — TORSEMIDE 20 MG PO TABS: 20.0000 mg | ORAL_TABLET | Freq: Every day | ORAL | 3 refills | Status: DC

## 2017-04-29 MED ORDER — POTASSIUM CHLORIDE CRYS ER 20 MEQ PO TBCR: 20.0000 meq | EXTENDED_RELEASE_TABLET | Freq: Every day | ORAL | 3 refills | Status: DC

## 2017-04-29 NOTE — Patient Instructions (Signed)
Medication Instructions:  The current medical regimen is effective;  continue present plan and medications.  Follow-Up: Follow up in 2 years with Dr. Percival Spanish.  You will receive a letter in the mail 2 months before you are due.  Please call us when you receive this letter to schedule your follow up appointment.  If you need a refill on your cardiac medications before your next appointment, please call your pharmacy.  Thank you for choosing [facility]!!

## 2017-05-08 ENCOUNTER — Ambulatory Visit (INDEPENDENT_AMBULATORY_CARE_PROVIDER_SITE_OTHER): Payer: Medicare HMO | Admitting: Family Medicine

## 2017-05-08 ENCOUNTER — Telehealth: Payer: Self-pay | Admitting: Pharmacist

## 2017-05-08 ENCOUNTER — Encounter: Payer: Self-pay | Admitting: Family Medicine

## 2017-05-08 VITALS — BP 134/60 | HR 72 | Temp 97.6°F | Ht 65.0 in | Wt 271.0 lb

## 2017-05-08 DIAGNOSIS — E1121 Type 2 diabetes mellitus with diabetic nephropathy: Secondary | ICD-10-CM | POA: Diagnosis not present

## 2017-05-08 DIAGNOSIS — Z794 Long term (current) use of insulin: Secondary | ICD-10-CM

## 2017-05-08 DIAGNOSIS — D500 Iron deficiency anemia secondary to blood loss (chronic): Secondary | ICD-10-CM

## 2017-05-08 DIAGNOSIS — I482 Chronic atrial fibrillation, unspecified: Secondary | ICD-10-CM

## 2017-05-08 DIAGNOSIS — I5022 Chronic systolic (congestive) heart failure: Secondary | ICD-10-CM

## 2017-05-08 DIAGNOSIS — N183 Chronic kidney disease, stage 3 unspecified: Secondary | ICD-10-CM

## 2017-05-08 DIAGNOSIS — E039 Hypothyroidism, unspecified: Secondary | ICD-10-CM

## 2017-05-08 DIAGNOSIS — E119 Type 2 diabetes mellitus without complications: Secondary | ICD-10-CM | POA: Diagnosis not present

## 2017-05-08 DIAGNOSIS — D696 Thrombocytopenia, unspecified: Secondary | ICD-10-CM

## 2017-05-08 LAB — COAGUCHEK XS/INR WAIVED
INR: 1.8 — ABNORMAL HIGH (ref 0.9–1.1)
Prothrombin Time: 21.6 s

## 2017-05-08 LAB — BAYER DCA HB A1C WAIVED: HB A1C (BAYER DCA - WAIVED): 5.4 % (ref <7.0)

## 2017-05-08 NOTE — Progress Notes (Signed)
Subjective:  Patient ID: [name], female    DOB: [birth year]  Age: 71 y.o. MRN: [id number]  CC: Diabetes (pt here today for routine follow up of her chronic medical conditions )   HPI [name] presents forFollow-up of diabetes. Patient checks blood sugar at home.   100-110 fasting and  postprandial Patient denies symptoms such as polyuria, polydipsia, excessive hunger, nausea No significant hypoglycemic spells noted. Medications reviewed. Pt reports taking them regularly without complication/adverse reaction being reported today.  Checking feet daily.  History [name] has a past medical history of Anemia; Arthritis; Asthma; Atrial fibrillation ([place]); CAD (coronary artery disease); Cataract; CHF (congestive heart failure) ([place]); COPD (chronic obstructive pulmonary disease) ([place]); Diabetes mellitus; Gait instability; GERD (gastroesophageal reflux disease); Hyperlipidemia; Hypertension; Obesity; Oxygen dependent; Sleep apnea, obstructive (2008); and Stroke [facility]).   She has a past surgical history that includes Cataract extraction w/ intraocular lens  implant, bilateral; Appendectomy; Cholecystectomy; Carpal tunnel release; Cervical spine surgery; Cesarean section; Umbilical hernia repair; Knee arthroscopy; Colonoscopy (11/22/2002); Upper gastrointestinal endoscopy (11/22/2002); Colonoscopy (11/26/2005); Femur IM nail (Left, 05/08/2013); Hip surgery (Left); and Eye surgery.   Her family history includes Alzheimer's disease in her father and mother; Asthma in her daughter and grandchild; Breast cancer in her sister; Cancer in her brother, mother, sister, and sister; Colon cancer in her mother; Diabetes in her daughter, mother, and sister; Heart attack in her mother and sister; Heart disease in her brother, mother, and sister; Kidney disease in her mother; Migraines in her sister.She reports that she has never smoked. She has never used smokeless tobacco. She reports that she does not  drink alcohol or use drugs.  Current Outpatient Prescriptions on File Prior to Visit  Medication Sig Dispense Refill  . acetaminophen (TYLENOL) 325 MG tablet Take 2 tablets (650 mg total) by mouth every 6 (six) hours as needed for pain or fever.    Marland Kitchen albuterol (PROVENTIL HFA;VENTOLIN HFA) 108 (90 Base) MCG/ACT inhaler Inhale 2 puffs into the lungs every 6 (six) hours as needed for wheezing or shortness of breath. 1 Inhaler 0  . alendronate (FOSAMAX) 70 MG tablet Take 1 tablet (70 mg total) by mouth every 7 (seven) days. 4 tablet 2  . B-D ULTRAFINE III SHORT PEN 31G X 8 MM MISC     . Blood Glucose Monitoring Suppl (TRUE METRIX GO GLUCOSE METER) w/Device KIT 1 strip by Does not apply route 2 (two) times daily. 1 kit 5  . Cholecalciferol ([employer] VITAMIN D3) 1000 UNITS CHEW Chew 1 tablet (1,000 Units total) by mouth daily. 30 tablet   . diltiazem (CARDIZEM CD) 180 MG 24 hr capsule TAKE 1 CAPSULE (180 MG TOTAL) BY MOUTH DAILY. 90 capsule 3  . Dulaglutide (TRULICITY) 3.38 SN/0.5LZ SOPN Inject 0.75 mg into the skin once a week. 4 pen 0  . Elastic Bandages & Supports (JOBST RELIEF 30-40MMHG XL) MISC Wear daily for swelling in legs 2 each 11  . febuxostat (ULORIC) 40 MG tablet TAKE 1 TABLET (40 MG TOTAL) BY MOUTH DAILY. 90 tablet 0  . fish oil-omega-3 fatty acids 1000 MG capsule Take 2 g by mouth daily.      . fluocinonide-emollient (LIDEX-E) 0.05 % cream Apply to feet after soaking each night 60 g 5  . fluticasone (FLONASE) 50 MCG/ACT nasal spray Place 1 spray into both nostrils 2 (two) times daily as needed for allergies or rhinitis. 16 g 6  . glucose blood test strip test sugars twice daily 100 each 12  .  Insulin Detemir (LEVEMIR FLEXTOUCH) 100 UNIT/ML Pen Inject 70 Units into the skin daily with breakfast. 30 mL 1  . Lancets MISC     . levothyroxine (SYNTHROID, LEVOTHROID) 50 MCG tablet Take 1 tablet by mouth daily.    Marland Kitchen lisinopril (PRINIVIL,ZESTRIL) 5 MG tablet Take 1 tablet (5 mg total) by mouth  daily. 90 tablet 3  . metFORMIN (GLUCOPHAGE) 850 MG tablet TAKE 1 TABLET (850 MG TOTAL) BY MOUTH 2 (TWO) TIMES DAILY. 180 tablet 0  . potassium chloride SA (KLOR-CON M20) 20 MEQ tablet Take 1 tablet (20 mEq total) by mouth daily. 90 tablet 3  . rosuvastatin (CRESTOR) 10 MG tablet TAKE 1 TABLET (10 MG TOTAL) BY MOUTH AT BEDTIME. 90 tablet 0  . torsemide (DEMADEX) 20 MG tablet Take 1 tablet (20 mg total) by mouth daily. 90 tablet 3  . warfarin (COUMADIN) 5 MG tablet TAKE 1 TABLET BY MOUTH EVERY EVENING EXCEPT MONDAY. NO DOSE MONDAY 90 tablet 0   Current Facility-Administered Medications on File Prior to Visit  Medication Dose Route Frequency Provider Last Rate Last Dose  . betamethasone acetate-betamethasone sodium phosphate (CELESTONE) injection 6 mg  6 mg Intramuscular Once Claretta Fraise, MD        ROS Review of Systems  Constitutional: Negative for activity change, appetite change and fever.  HENT: Negative for congestion, rhinorrhea and sore throat.   Eyes: Negative for visual disturbance.  Respiratory: Positive for shortness of breath (when out in the heat). Negative for cough.   Cardiovascular: Negative for chest pain and palpitations.  Gastrointestinal: Negative for abdominal pain, diarrhea and nausea.  Genitourinary: Negative for dysuria.  Musculoskeletal: Negative for arthralgias and myalgias.    Objective:  BP 134/60   Pulse 72   Temp 97.6 F (36.4 C) (Oral)   Ht _0  (1.651 m)   Wt 271 lb (122.9 kg)   SpO2 97%   BMI 45.10 kg/m   BP Readings from Last 3 Encounters:  05/08/17 134/60  04/29/17 (!) 150/60  02/12/17 124/78    Wt Readings from Last 3 Encounters:  05/08/17 271 lb (122.9 kg)  04/29/17 269 lb (122 kg)  02/04/17 267 lb (121.1 kg)     Physical Exam  Constitutional: She is oriented to person, place, and time. She appears well-developed and well-nourished. No distress.  HENT:  Head: Normocephalic and atraumatic.  Right Ear: External ear normal.    Left Ear: External ear normal.  Nose: Nose normal.  Mouth/Throat: Oropharynx is clear and moist.  Eyes: Conjunctivae and EOM are normal. Pupils are equal, round, and reactive to light.  Neck: Normal range of motion. Neck supple. No thyromegaly present.  Cardiovascular: Normal rate, regular rhythm and normal heart sounds.   No murmur heard. Pulmonary/Chest: Effort normal and breath sounds normal. No respiratory distress. She has no wheezes. She has no rales.  Abdominal: Soft. Bowel sounds are normal. She exhibits no distension. There is no tenderness.  Lymphadenopathy:    She has no cervical adenopathy.  Neurological: She is alert and oriented to person, place, and time. She has normal reflexes.  Skin: Skin is warm and dry.  Psychiatric: She has a normal mood and affect. Her behavior is normal. Judgment and thought content normal.    No components found for: BAYER     Assessment & Plan:   [name] was seen today for diabetes.  Diagnoses and all orders for this visit:  Chronic atrial fibrillation ([employer]) -     Bayer DCA Hb A1c Waived -  CBC with Differential/Platelet -     CMP14+EGFR -     DME Other see comment -     CoaguChek XS/INR Waived  Chronic systolic congestive heart failure (HCC) -     Bayer DCA Hb A1c Waived -     CBC with Differential/Platelet -     CMP14+EGFR -     DME Other see comment  Normocytic anemia due to blood loss -     CBC with Differential/Platelet -     DME Other see comment  Thrombocytopenia, unspecified (HCC) -     CBC with Differential/Platelet -     DME Other see comment  Chronic renal impairment, stage 3 (moderate) -     Bayer DCA Hb A1c Waived -     CBC with Differential/Platelet -     CMP14+EGFR -     DME Other see comment  Diabetic nephropathy associated with type 2 diabetes mellitus (HCC) -     Bayer DCA Hb A1c Waived -     CBC with Differential/Platelet -     CMP14+EGFR -     DME Other see comment  Hypothyroidism, unspecified  type -     Bayer DCA Hb A1c Waived -     CBC with Differential/Platelet -     CMP14+EGFR -     TSH + free T4 -     DME Other see comment  Long-term current use of insulin for diabetes mellitus ([place]) -     Bayer DCA Hb A1c Waived -     CBC with Differential/Platelet -     CMP14+EGFR -     DME Other see comment      I am having Ms. Odonoghue maintain her fish oil-omega-3 fatty acids, alendronate, acetaminophen, Lancets, Cholecalciferol, JOBST RELIEF 30-40MMHG XL, fluocinonide-emollient, fluticasone, B-D ULTRAFINE III SHORT PEN, Insulin Detemir, rosuvastatin, Dulaglutide, febuxostat, warfarin, glucose blood, albuterol, TRUE METRIX GO GLUCOSE METER, levothyroxine, metFORMIN, diltiazem, lisinopril, torsemide, and potassium chloride SA. We will continue to administer betamethasone acetate-betamethasone sodium phosphate.  No orders of the defined types were placed in this encounter.    Follow-up: Return in about 3 months (around 08/08/2017).  Claretta Fraise, M.D.

## 2017-05-08 NOTE — Telephone Encounter (Signed)
Instructed pt to increase Saturday dose from 1/2 tab to 1 full tab. Also told pt next apt is scheduled for 06/08/17.

## 2017-05-09 LAB — CBC WITH DIFFERENTIAL/PLATELET
BASOS ABS: 0 10*3/uL (ref 0.0–0.2)
BASOS: 0 %
EOS (ABSOLUTE): 0.1 10*3/uL (ref 0.0–0.4)
EOS: 2 %
HEMATOCRIT: 30.5 % — AB (ref 34.0–46.6)
Hemoglobin: 9.5 g/dL — ABNORMAL LOW (ref 11.1–15.9)
Immature Grans (Abs): 0 10*3/uL (ref 0.0–0.1)
Immature Granulocytes: 0 %
LYMPHS: 18 %
Lymphocytes Absolute: 1 10*3/uL (ref 0.7–3.1)
MCH: 28.5 pg (ref 26.6–33.0)
MCHC: 31.1 g/dL — AB (ref 31.5–35.7)
MCV: 92 fL (ref 79–97)
MONOCYTES: 10 %
Monocytes Absolute: 0.6 10*3/uL (ref 0.1–0.9)
NEUTROS ABS: 4 10*3/uL (ref 1.4–7.0)
NEUTROS PCT: 70 %
Platelets: 197 10*3/uL (ref 150–379)
RBC: 3.33 x10E6/uL — AB (ref 3.77–5.28)
RDW: 16.2 % — AB (ref 12.3–15.4)
WBC: 5.8 10*3/uL (ref 3.4–10.8)

## 2017-05-09 LAB — CMP14+EGFR
ALK PHOS: 128 IU/L — AB (ref 39–117)
ALT: 13 IU/L (ref 0–32)
AST: 18 IU/L (ref 0–40)
Albumin/Globulin Ratio: 1.5 (ref 1.2–2.2)
Albumin: 3.8 g/dL (ref 3.5–4.8)
BILIRUBIN TOTAL: 0.4 mg/dL (ref 0.0–1.2)
BUN/Creatinine Ratio: 19 (ref 12–28)
BUN: 20 mg/dL (ref 8–27)
CHLORIDE: 108 mmol/L — AB (ref 96–106)
CO2: 21 mmol/L (ref 18–29)
CREATININE: 1.08 mg/dL — AB (ref 0.57–1.00)
Calcium: 8.7 mg/dL (ref 8.7–10.3)
GFR calc Af Amer: 59 mL/min/{1.73_m2} — ABNORMAL LOW (ref >59)
GFR calc non Af Amer: 51 mL/min/{1.73_m2} — ABNORMAL LOW (ref >59)
GLOBULIN, TOTAL: 2.5 g/dL (ref 1.5–4.5)
GLUCOSE: 133 mg/dL — AB (ref 65–99)
Potassium: 4.5 mmol/L (ref 3.5–5.2)
SODIUM: 145 mmol/L — AB (ref 134–144)
Total Protein: 6.3 g/dL (ref 6.0–8.5)

## 2017-05-09 LAB — TSH+FREE T4
FREE T4: 1.39 ng/dL (ref 0.82–1.77)
TSH: 2.1 u[IU]/mL (ref 0.450–4.500)

## 2017-05-10 DIAGNOSIS — J449 Chronic obstructive pulmonary disease, unspecified: Secondary | ICD-10-CM | POA: Diagnosis not present

## 2017-05-12 ENCOUNTER — Other Ambulatory Visit: Payer: Self-pay

## 2017-05-12 DIAGNOSIS — D696 Thrombocytopenia, unspecified: Secondary | ICD-10-CM

## 2017-05-12 DIAGNOSIS — D500 Iron deficiency anemia secondary to blood loss (chronic): Secondary | ICD-10-CM

## 2017-05-13 LAB — IRON AND TIBC
IRON: 32 ug/dL (ref 27–139)
Iron Saturation: 11 % — ABNORMAL LOW (ref 15–55)
TIBC: 297 ug/dL (ref 250–450)
UIBC: 265 ug/dL (ref 118–369)

## 2017-05-13 LAB — FERRITIN: Ferritin: 72 ng/mL (ref 15–150)

## 2017-05-13 LAB — SPECIMEN STATUS REPORT

## 2017-06-08 ENCOUNTER — Ambulatory Visit (INDEPENDENT_AMBULATORY_CARE_PROVIDER_SITE_OTHER): Payer: Medicare HMO | Admitting: Pediatrics

## 2017-06-08 ENCOUNTER — Encounter: Payer: Self-pay | Admitting: Pediatrics

## 2017-06-08 ENCOUNTER — Ambulatory Visit (INDEPENDENT_AMBULATORY_CARE_PROVIDER_SITE_OTHER): Payer: Medicare HMO | Admitting: Pharmacist

## 2017-06-08 VITALS — BP 142/64 | HR 87 | Temp 97.5°F | Ht 65.0 in | Wt 275.0 lb

## 2017-06-08 DIAGNOSIS — I482 Chronic atrial fibrillation, unspecified: Secondary | ICD-10-CM

## 2017-06-08 DIAGNOSIS — R399 Unspecified symptoms and signs involving the genitourinary system: Secondary | ICD-10-CM | POA: Diagnosis not present

## 2017-06-08 DIAGNOSIS — N3090 Cystitis, unspecified without hematuria: Secondary | ICD-10-CM | POA: Diagnosis not present

## 2017-06-08 LAB — MICROSCOPIC EXAMINATION
Renal Epithel, UA: NONE SEEN /hpf
WBC, UA: >30 /hpf — AB (ref 0–?)

## 2017-06-08 LAB — URINALYSIS, COMPLETE
Bilirubin, UA: NEGATIVE
Glucose, UA: NEGATIVE
KETONES UA: NEGATIVE
NITRITE UA: NEGATIVE
PH UA: 5 (ref 5.0–7.5)
RBC, UA: NEGATIVE
SPEC GRAV UA: 1.025 (ref 1.005–1.030)
Urobilinogen, Ur: 0.2 mg/dL (ref 0.2–1.0)

## 2017-06-08 LAB — COAGUCHEK XS/INR WAIVED
INR: 1.8 — ABNORMAL HIGH (ref 0.9–1.1)
Prothrombin Time: 21.2 s

## 2017-06-08 MED ORDER — CEFTRIAXONE SODIUM 1 G IJ SOLR
1.0000 g | Freq: Once | INTRAMUSCULAR | Status: AC
  Administered 2017-06-08: 1 g via INTRAMUSCULAR

## 2017-06-08 MED ORDER — CEFDINIR 300 MG PO CAPS: 300.0000 mg | ORAL_CAPSULE | Freq: Two times a day (BID) | ORAL | 0 refills | Status: AC

## 2017-06-08 NOTE — Progress Notes (Signed)
  Subjective:   Patient ID: [name], female    DOB: 1946-04-18, 71 y.o.   MRN: [id number] CC: Back Pain and Burning with Urination  HPI: [name] is a 71 y.o. female presenting for Back Pain and Burning with Urination  First uti symptoms this year Back pain bothering her the most Dysuria started about a week ago No fevers No abd pain Appetite been fine  No change in swelling in legs  Relevant past medical, surgical, family and social history reviewed. Allergies and medications reviewed and updated. History  Smoking Status  . Never Smoker  Smokeless Tobacco  . Never Used   ROS: Per HPI   Objective:    BP (!) 142/64   Pulse 87   Temp (!) 97.5 F (36.4 C) (Oral)   Ht 5\' 5"  (1.651 m)   Wt 275 lb (124.7 kg)   BMI 45.76 kg/m   Wt Readings from Last 3 Encounters:  06/08/17 275 lb (124.7 kg)  05/08/17 271 lb (122.9 kg)  04/29/17 269 lb (122 kg)    Gen: NAD, alert, cooperative with exam, NCAT EYES: EOMI, no conjunctival injection, or no icterus ENT:  TMs pearly gray b/l, OP without erythema LYMPH: no cervical LAD CV: NRRR, normal S1/S2, no murmur, distal pulses 2+ b/l Resp: CTABL, no wheezes, normal WOB Abd: +BS, soft, NTND. no guarding or organomegaly, +L CVA tenderness EXT: b/l 2+ woody edema  Neuro: Alert and oriented, strength equal b/l UE and LE, coordination grossly normal MSK: normal muscle bulk  Assessment & Plan:  [name] was seen today for back pain and burning with urination.  Diagnoses and all orders for this visit:  Cystitis IM CTX with CVA tenderness for first dose, then start cefdinir Will f/u urine culture No fevers -     Urine Culture -     cefdinir (OMNICEF) 300 MG capsule; Take 1 capsule (300 mg total) by mouth 2 (two) times daily. -     cefTRIAXone (ROCEPHIN) injection 1 g; Inject 1 g into the muscle once.  UTI symptoms -     Urinalysis, Complete  Other orders -     Microscopic Examination   Follow up plan: As  needed Assunta Found, MD [place]

## 2017-06-09 DIAGNOSIS — J449 Chronic obstructive pulmonary disease, unspecified: Secondary | ICD-10-CM | POA: Diagnosis not present

## 2017-06-10 LAB — URINE CULTURE: Organism ID, Bacteria: NO GROWTH

## 2017-06-30 ENCOUNTER — Other Ambulatory Visit: Payer: Self-pay | Admitting: Family Medicine

## 2017-07-06 ENCOUNTER — Ambulatory Visit (INDEPENDENT_AMBULATORY_CARE_PROVIDER_SITE_OTHER): Payer: Medicare HMO | Admitting: Pharmacist

## 2017-07-06 DIAGNOSIS — I482 Chronic atrial fibrillation, unspecified: Secondary | ICD-10-CM

## 2017-07-06 LAB — COAGUCHEK XS/INR WAIVED
INR: 3.1 — AB (ref 0.9–1.1)
PROTHROMBIN TIME: 37.7 s

## 2017-07-06 NOTE — Patient Instructions (Signed)
Anticoagulation Warfarin Dose Instructions as of 07/06/2017      [name] Tue Wed Thu Fri Sat   New Dose 2.5 mg 5 mg 5 mg 5 mg 2.5 mg 5 mg 5 mg    Description   No warfarin today - Monday, August 6th.  Then continue current warfarin 5mg  dose - take 1/2 tablet on Sundays and Wednesdays only.  Take 1 tablet all other days.  INR was 3.1 today (goal 2.0 to 3.0)

## 2017-07-10 DIAGNOSIS — J449 Chronic obstructive pulmonary disease, unspecified: Secondary | ICD-10-CM | POA: Diagnosis not present

## 2017-07-14 ENCOUNTER — Other Ambulatory Visit: Payer: Self-pay | Admitting: Family Medicine

## 2017-07-14 MED ORDER — WARFARIN SODIUM 5 MG PO TABS: ORAL_TABLET | ORAL | 0 refills | Status: DC

## 2017-07-31 ENCOUNTER — Encounter: Payer: Self-pay | Admitting: Pharmacist Clinician (PhC)/ Clinical Pharmacy Specialist

## 2017-08-04 ENCOUNTER — Ambulatory Visit (INDEPENDENT_AMBULATORY_CARE_PROVIDER_SITE_OTHER): Payer: Medicare HMO | Admitting: *Deleted

## 2017-08-04 DIAGNOSIS — I482 Chronic atrial fibrillation, unspecified: Secondary | ICD-10-CM

## 2017-08-04 LAB — COAGUCHEK XS/INR WAIVED
INR: 4.9 — AB (ref 0.9–1.1)
PROTHROMBIN TIME: 59.3 s

## 2017-08-04 NOTE — Patient Instructions (Signed)
Anticoagulation Warfarin Dose Instructions as of 08/04/2017      [name] Tue Wed Thu Fri Sat   New Dose 2.5 mg 5 mg 5 mg 5 mg 2.5 mg 5 mg 5 mg    Description   Hold coumadin today and tomorrow. Return on Thursday for a recheck.   INR was 4.9 today (goal 2.0 to 3.0)

## 2017-08-04 NOTE — Progress Notes (Signed)
Subjective:     Indication: atrial fibrillation Bleeding signs/symptoms: None Thromboembolic signs/symptoms: None  Missed Coumadin doses: Over one week ago -  Instructed to skip 1 dose at last appt 4 weeks ago Medication changes: no Dietary changes: yes - Moved in with her daughter one week ago and is eating less greans than usual. Bacterial/viral infection: no Other concerns: no    Objective:    INR Today: 4.9 Current dose: 5 mg daily except for 2.5 mgs on Sunday and Thursday    Assessment:    Supratherapeutic INR for goal of 2-3   Plan:  Per Claretta Fraise, MD   1. New dose: Hold coumadin today and tomorrow   2. Next INR: 2 days     Chong Sicilian, RN

## 2017-08-06 ENCOUNTER — Ambulatory Visit (INDEPENDENT_AMBULATORY_CARE_PROVIDER_SITE_OTHER): Payer: Medicare HMO | Admitting: *Deleted

## 2017-08-06 DIAGNOSIS — I482 Chronic atrial fibrillation, unspecified: Secondary | ICD-10-CM

## 2017-08-06 LAB — COAGUCHEK XS/INR WAIVED
INR: 4.8 — ABNORMAL HIGH (ref 0.9–1.1)
PROTHROMBIN TIME: 57.1 s

## 2017-08-06 NOTE — Progress Notes (Signed)
Subjective:     Indication: atrial fibrillation Bleeding signs/symptoms: None Thromboembolic signs/symptoms: None  Missed Coumadin doses: Coumadin was held for 2 days as instructed due to elevated INR Medication changes: no Dietary changes: no Bacterial/viral infection: no Other concerns: no   Objective:    INR Today: 4.8 Current dose: 5 mg daily except 2.5 mg on Sun and Thurs. Held 2 doses this week as instructed.     Assessment:    Supratherapeutic INR for goal of 2-3   Plan:    1. New dose: Hold coumadin today, tomorrow, Sat, and Sunday. 2. Next INR: Monday   08/10/17  Discussed with Claretta Fraise, MD and he made the adjustment.   Patient advised of changes and verbalized understanding. Handout with bleeding risk and warning signs given.   Chong Sicilian, RN

## 2017-08-06 NOTE — Patient Instructions (Signed)
Anticoagulation Warfarin Dose Instructions as of 08/06/2017      [name] Tue Wed Thu Fri Sat   New Dose 2.5 mg 5 mg 5 mg 5 mg 2.5 mg 5 mg 5 mg    Description   Hold coumadin until Monday 08/10/17.  Your INR was 4.8 today which is too thin.  Your goal is 2.0 - 3.0  Return on Monday 08/10/17      Bleeding Precautions When on Anticoagulant Therapy WHAT IS ANTICOAGULANT THERAPY? Anticoagulant therapy is taking medicine to prevent or reduce blood clots. It is also called blood thinner therapy. Blood clots that form in your blood vessels can be dangerous. They can break loose and travel to your heart, lungs, or brain. This increases your risk of a heart attack or stroke. Anticoagulant therapy causes blood to clot more slowly. You may need anticoagulant therapy if you have:  A medical condition that increases the likelihood that blood clots will form.  A heart defect or a problem with heart rhythm. It is also a common treatment after heart surgery, such as valve replacement. WHAT ARE COMMON TYPES OF ANTICOAGULANT THERAPY? Anticoagulant medicine can be injected or taken by mouth.If you need anticoagulant therapy quickly at the hospital, the medicine may be injected under your skin or given through an IV tube. Heparin is a common example of an anticoagulant that you may get at the hospital. Most anticoagulant therapy is in the form of pills that you take at home every day. These may include:  Aspirin. This common blood thinner works by preventing blood cells (platelets) from sticking together to form a clot. Aspirin is not as strong as anticoagulants that slow down the time that it takes for your body to form a clot.  Clopidogrel. This is a newer type of drug that affects platelets. It is stronger than aspirin.  Warfarin. This is the most common anticoagulant. It changes the way your body uses vitamin K, a vitamin that helps your blood to clot. The risk of bleeding is higher with warfarin than  with aspirin. You will need frequent blood tests to make sure you are taking the safest amount.  New anticoagulants. Several new drugs have been approved. They are all taken by mouth. Studies show that these drugs work as well as warfarin. They do not require blood testing. They may cause less bleeding risk than warfarin. WHAT DO I NEED TO REMEMBER WHEN TAKING ANTICOAGULANT THERAPY? Anticoagulant therapy decreases your risk of forming a blood clot, but it increases your risk of bleeding. Work closely with your health care provider to make sure you are taking your medicine safely. These tips can help:  Learn ways to reduce your risk of bleeding.  If you are taking warfarin: ? Have blood tests as ordered by your health care provider. ? Do not make any sudden changes to your diet. Vitamin K in your diet can make warfarin less effective. ? Do not get pregnant. This medicine may cause birth defects.  Take your medicine at the same time every day. If you forget to take your medicine, take it as soon as you remember. If you miss a whole day, do not double your dose of medicine. Take your normal dose and call your health care provider to check in.  Do not stop taking your medicine on your own.  Tell your health care provider before you start taking any new medicine, vitamin, or herbal product. Some of these could interfere with your therapy.  Tell  all of your health care providers that you are on anticoagulant therapy.  Do not have surgery, medical procedures, or dental work until you tell your health care provider that you are on anticoagulant therapy. WHAT CAN AFFECT HOW ANTICOAGULANTS WORK? Certain foods, vitamins, medicines, supplements, and herbal medicines change the way that anticoagulant therapy works. They may increase or decrease the effects of your anticoagulant therapy. Either result can be dangerous for you.  Many over-the-counter medicines for pain, colds, or stomach problems interfere  with anticoagulant therapy. Take these only as told by your health care provider.  Do not drink alcohol. It can interfere with your medicine and increase your risk of an injury that causes bleeding.  If you are taking warfarin, do not begin eating more foods that contain vitamin K. These include leafy green vegetables. Ask your health care provider if you should avoid any foods. WHAT ARE SOME WAYS TO PREVENT BLEEDING? You can prevent bleeding by taking certain precautions:  Be extra careful when you use knives, scissors, or other sharp objects.  Use an electric razor instead of a blade.  Do not use toothpicks.  Use a soft toothbrush.  Wear shoes that have nonskid soles.  Use bath mats and handrails in your bathroom.  Wear gloves while you do yard work.  Wear a helmet when you ride a bike.  Wear your seat belt.  Prevent falls by removing loose rugs and extension cords from areas where you walk.  Do not play contact sports or participate in other activities that have a high risk of injury. [place] PROVIDER? Call your health care provider if:  You miss a dose of medicine: ? And you are not sure what to do. ? For more than one day.  You have: ? Menstrual bleeding that is heavier than normal. ? Blood in your urine. ? A bloody nose or bleeding gums. ? Easy bruising. ? Blood in your stool (feces) or have black and tarry stool. ? Side effects from your medicine.  You feel weak or dizzy.  You become pregnant. Seek immediate medical care if:  You have bleeding that will not stop.  You have sudden and severe headache or belly pain.  You vomit or you cough up bright red blood.  You have a severe blow to your head. WHAT ARE SOME QUESTIONS TO ASK MY HEALTH CARE PROVIDER?  What is the best anticoagulant therapy for my condition?  What side effects should I watch for?  When should I take my medicine? What should I do if I forget to take  it?  Will I need to have regular blood tests?  Do I need to change my diet? Are there foods or drinks that I should avoid?  What activities are safe for me?  What should I do if I want to get pregnant? This information is not intended to replace advice given to you by your health care provider. Make sure you discuss any questions you have with your health care provider. Document Released: 10/29/2015 Document Reviewed: 10/29/2015 Elsevier Interactive Patient Education  2017 [employer].

## 2017-08-10 ENCOUNTER — Ambulatory Visit (INDEPENDENT_AMBULATORY_CARE_PROVIDER_SITE_OTHER): Payer: Medicare HMO | Admitting: *Deleted

## 2017-08-10 DIAGNOSIS — I482 Chronic atrial fibrillation, unspecified: Secondary | ICD-10-CM

## 2017-08-10 DIAGNOSIS — J449 Chronic obstructive pulmonary disease, unspecified: Secondary | ICD-10-CM | POA: Diagnosis not present

## 2017-08-10 LAB — COAGUCHEK XS/INR WAIVED
INR: 1.5 — AB (ref 0.9–1.1)
PROTHROMBIN TIME: 17.7 s

## 2017-08-10 MED ORDER — WARFARIN SODIUM 1 MG PO TABS: 1.0000 mg | ORAL_TABLET | Freq: Every day | ORAL | 0 refills | Status: DC

## 2017-08-10 NOTE — Patient Instructions (Signed)
Anticoagulation Warfarin Dose Instructions as of 08/10/2017      Dorene Grebe Tue Wed Thu Fri Sat   New Dose 3.5 mg 3.5 mg 3.5 mg 3.5 mg 3.5 mg 3.5 mg 3.5 mg    Description   Take 3.5 mg daily.  Take 1/2 of a 5mg  tablet and 1 whole 1mg  tablet daily.  Your INR was 1.5 today which is too thick.  Your goal is 2.0 - 3.0  Return on Friday 08/14/17    Keep the wrap on your leg until Friday if possible. Keep leg elevated when sitting.

## 2017-08-11 ENCOUNTER — Ambulatory Visit (INDEPENDENT_AMBULATORY_CARE_PROVIDER_SITE_OTHER): Payer: Medicare HMO | Admitting: Family Medicine

## 2017-08-11 VITALS — BP 133/67 | HR 87 | Temp 97.0°F | Ht 65.0 in | Wt 275.0 lb

## 2017-08-11 DIAGNOSIS — I5022 Chronic systolic (congestive) heart failure: Secondary | ICD-10-CM

## 2017-08-11 DIAGNOSIS — E039 Hypothyroidism, unspecified: Secondary | ICD-10-CM

## 2017-08-11 DIAGNOSIS — I482 Chronic atrial fibrillation, unspecified: Secondary | ICD-10-CM

## 2017-08-11 DIAGNOSIS — N183 Chronic kidney disease, stage 3 unspecified: Secondary | ICD-10-CM

## 2017-08-11 DIAGNOSIS — E1121 Type 2 diabetes mellitus with diabetic nephropathy: Secondary | ICD-10-CM

## 2017-08-11 LAB — BAYER DCA HB A1C WAIVED: HB A1C (BAYER DCA - WAIVED): 6.3 % (ref <7.0)

## 2017-08-11 MED ORDER — TORSEMIDE 20 MG PO TABS: ORAL_TABLET | ORAL | 3 refills | Status: DC

## 2017-08-11 MED ORDER — TORSEMIDE 20 MG PO TABS: 40.0000 mg | ORAL_TABLET | Freq: Every day | ORAL | 3 refills | Status: DC

## 2017-08-11 MED ORDER — ECONAZOLE NITRATE 1 % EX CREA: TOPICAL_CREAM | Freq: Every day | CUTANEOUS | 0 refills | Status: DC

## 2017-08-11 MED ORDER — ALBUTEROL SULFATE HFA 108 (90 BASE) MCG/ACT IN AERS: 2.0000 | INHALATION_SPRAY | Freq: Four times a day (QID) | RESPIRATORY_TRACT | 0 refills | Status: DC | PRN

## 2017-08-11 MED ORDER — ALENDRONATE SODIUM 70 MG PO TABS: 70.0000 mg | ORAL_TABLET | ORAL | 2 refills | Status: DC

## 2017-08-11 MED ORDER — LEVOTHYROXINE SODIUM 50 MCG PO TABS: 50.0000 ug | ORAL_TABLET | Freq: Every day | ORAL | 1 refills | Status: DC

## 2017-08-11 MED ORDER — INSULIN DETEMIR 100 UNIT/ML FLEXPEN: 70.0000 [IU] | PEN_INJECTOR | Freq: Every day | SUBCUTANEOUS | 1 refills | Status: DC

## 2017-08-11 MED ORDER — METFORMIN HCL 850 MG PO TABS: ORAL_TABLET | ORAL | 0 refills | Status: DC

## 2017-08-11 NOTE — Progress Notes (Signed)
Subjective:  Patient ID: [name],  female    DOB: 06/25/1946  Age: 71 y.o.    CC: Diabetes (pt here today for routine follow up of her chronic medical conditions and also has a rash under both breasts as well as her right lower leg is weeping)   HPI [name] presents for  follow-up of hypertension. Patient has no history of headache chest pain or shortness of breath or recent cough. Patient also denies symptoms of TIA such as numbness weakness lateralizing.  Patient denies side effects from medication. States taking it regularly.  Patient also  in for follow-up of elevated cholesterol. Doing well without complaints on current medication. Denies side effects of statin including myalgia and arthralgia and nausea. Also in today for liver function testing. Currently no chest pain, shortness of breath or other cardiovascular related symptoms noted.  Follow-up of diabetes. Patient does check blood sugar at home. However she ran out of strips recently hasn't been checked in several days fasting runs in the low 100s usually. No Log sheet returned. Patient denies symptoms such as polyuria, polydipsia, excessive hunger, nausea No significant hypoglycemic spells noted. Medications reviewed. Pt reports taking them regularly. Pt. denies complication/adverse reaction today.  Feeling weak and swollen all over.Patient presents for follow-up on  thyroid. The patient has a history of hypothyroidism for many years. It has been stable recently. Pt. denies any change in  voice, loss of hair, heat or cold intolerance. Energy level has been fair only. Patient denies constipation and diarrhea. No myxedema. Medication is as noted below. Verified that pt is taking it daily on an empty stomach. Well tolerated.  History [name] has a past medical history of Anemia; Arthritis; Asthma; Atrial fibrillation ([place]); CAD (coronary artery disease); Cataract; CHF (congestive heart failure) ([place]); COPD (chronic  obstructive pulmonary disease) ([place]); Diabetes mellitus; Gait instability; GERD (gastroesophageal reflux disease); Hyperlipidemia; Hypertension; Obesity; Oxygen dependent; Sleep apnea, obstructive (2008); and Stroke [facility]).   She has a past surgical history that includes Cataract extraction w/ intraocular lens  implant, bilateral; Appendectomy; Cholecystectomy; Carpal tunnel release; Cervical spine surgery; Cesarean section; Umbilical hernia repair; Knee arthroscopy; Colonoscopy (11/22/2002); Upper gastrointestinal endoscopy (11/22/2002); Colonoscopy (11/26/2005); Femur IM nail (Left, 05/08/2013); Hip surgery (Left); and Eye surgery.   Her family history includes Alzheimer's disease in her father and mother; Asthma in her daughter and grandchild; Breast cancer in her sister; Cancer in her brother, mother, sister, and sister; Colon cancer in her mother; Diabetes in her daughter, mother, sister, and unknown relative; Heart attack in her mother, sister, and unknown relative; Heart disease in her brother, mother, and sister; Kidney disease in her mother; Migraines in her sister.She reports that she has never smoked. She has never used smokeless tobacco. She reports that she does not drink alcohol or use drugs.  Current Outpatient Prescriptions on File Prior to Visit  Medication Sig Dispense Refill  . acetaminophen (TYLENOL) 500 MG tablet Take by mouth daily.    . B-D ULTRAFINE III SHORT PEN 31G X 8 MM MISC     . Blood Glucose Monitoring Suppl (TRUE METRIX GO GLUCOSE METER) w/Device KIT 1 strip by Does not apply route 2 (two) times daily. 1 kit 5  . diltiazem (CARDIZEM CD) 180 MG 24 hr capsule TAKE 1 CAPSULE (180 MG TOTAL) BY MOUTH DAILY. 90 capsule 3  . diphenhydrAMINE (SOMINEX) 25 MG tablet Take 50 mg by mouth at bedtime as needed for sleep.    Regino Schultze Bandages &  Supports (JOBST RELIEF 30-40MMHG XL) MISC Wear daily for swelling in legs 2 each 11  . ferrous sulfate 325 (65 FE) MG tablet Take 325 mg by mouth  daily with breakfast.    . fish oil-omega-3 fatty acids 1000 MG capsule Take 2 g by mouth daily.      . fluocinonide-emollient (LIDEX-E) 0.05 % cream Apply to feet after soaking each night 60 g 5  . fluticasone (FLONASE) 50 MCG/ACT nasal spray Place 1 spray into both nostrils 2 (two) times daily as needed for allergies or rhinitis. 16 g 6  . glucose blood test strip test sugars twice daily 100 each 12  . Lancets MISC     . lisinopril (PRINIVIL,ZESTRIL) 5 MG tablet Take 1 tablet (5 mg total) by mouth daily. 90 tablet 3  . Multiple Vitamins-Minerals (HAIR SKIN AND NAILS FORMULA PO) Take 1 tablet by mouth daily.    . potassium chloride SA (KLOR-CON M20) 20 MEQ tablet Take 1 tablet (20 mEq total) by mouth daily. 90 tablet 3  . TRULICITY 3.71 IR/6.7EL SOPN INJECT 0.75 MG INTO THE SKIN ONCE A WEEK. 4 mL 3  . vitamin B-12 (CYANOCOBALAMIN) 50 MCG tablet Take 50 mcg by mouth daily.    Marland Kitchen warfarin (COUMADIN) 1 MG tablet Take 1 tablet (1 mg total) by mouth daily. 90 tablet 0  . warfarin (COUMADIN) 5 MG tablet TAKE 1 TABLET BY MOUTH EVERY EVENING EXCEPT MONDAY. NO DOSE MONDAY 90 tablet 0   No current facility-administered medications on file prior to visit.     ROS Review of Systems  Constitutional: Positive for fatigue. Negative for activity change, appetite change and fever.  HENT: Negative for congestion, rhinorrhea and sore throat.   Eyes: Negative for visual disturbance.  Respiratory: Negative for cough and shortness of breath.   Cardiovascular: Negative for chest pain and palpitations.  Gastrointestinal: Negative for abdominal pain, diarrhea and nausea.  Genitourinary: Negative for dysuria.  Musculoskeletal: Negative for arthralgias and myalgias.  Neurological: Positive for weakness (nonfocal).  Psychiatric/Behavioral: Positive for dysphoric mood (very upset that 5 days ago a bear killed and ate her chihuahua).    Objective:  BP 133/67   Pulse 87   Temp (!) 97 F (36.1 C) (Oral)   Ht 5'  5" (1.651 m)   Wt 275 lb (124.7 kg)   BMI 45.76 kg/m   BP Readings from Last 3 Encounters:  08/11/17 133/67  06/08/17 (!) 142/64  05/08/17 134/60       Physical Exam  Constitutional: She is oriented to person, place, and time. She appears well-developed and well-nourished. No distress.  HENT:  Head: Normocephalic and atraumatic.  Right Ear: External ear normal.  Left Ear: External ear normal.  Nose: Nose normal.  Mouth/Throat: Oropharynx is clear and moist.  Eyes: Pupils are equal, round, and reactive to light. Conjunctivae and EOM are normal.  Neck: Normal range of motion. Neck supple. No thyromegaly present.  Cardiovascular: Normal rate, regular rhythm and normal heart sounds.   No murmur heard. Pulmonary/Chest: Effort normal and breath sounds normal. No respiratory distress. She has no wheezes. She has no rales.  Abdominal: Soft. Bowel sounds are normal. She exhibits no distension. There is no tenderness.  Musculoskeletal: Normal range of motion. She exhibits edema (4+ BLE to knees - weeping on right - Nurse applied unna boot yesterday.).  Lymphadenopathy:    She has no cervical adenopathy.  Neurological: She is alert and oriented to person, place, and time. She has normal reflexes.  Skin: Skin is warm and dry.  Psychiatric: She has a normal mood and affect. Her behavior is normal. Judgment and thought content normal.    Diabetic Foot Exam - Simple   No data filed        Assessment & Plan:   [name] was seen today for diabetes.  Diagnoses and all orders for this visit:  Chronic atrial fibrillation (HCC) -     Lipid panel  Chronic systolic congestive heart failure (HCC) -     Lipid panel  Chronic renal impairment, stage 3 (moderate) -     CBC with Differential/Platelet -     CMP14+EGFR  Diabetic nephropathy associated with type 2 diabetes mellitus (HCC) -     Bayer DCA Hb A1c Waived  Hypothyroidism, unspecified type -     TSH -     T4, Free  Other  orders -     albuterol (PROVENTIL HFA;VENTOLIN HFA) 108 (90 Base) MCG/ACT inhaler; Inhale 2 puffs into the lungs every 6 (six) hours as needed for wheezing or shortness of breath. -     alendronate (FOSAMAX) 70 MG tablet; Take 1 tablet (70 mg total) by mouth every 7 (seven) days. -     Insulin Detemir (LEVEMIR FLEXTOUCH) 100 UNIT/ML Pen; Inject 70 Units into the skin daily with breakfast. -     levothyroxine (SYNTHROID, LEVOTHROID) 50 MCG tablet; Take 1 tablet (50 mcg total) by mouth daily. -     metFORMIN (GLUCOPHAGE) 850 MG tablet; TAKE 1 TABLET (850 MG TOTAL) BY MOUTH 2 (TWO) TIMES DAILY. -     Discontinue: torsemide (DEMADEX) 20 MG tablet; Take 2 tablets (40 mg total) by mouth daily. -     torsemide (DEMADEX) 20 MG tablet; Take 2 tablets every morning and 1 tablet at noon. -     econazole nitrate 1 % cream; Apply topically daily.   I have discontinued Ms. Bevill's torsemide. I have also changed her levothyroxine and torsemide. Additionally, I am having her start on econazole nitrate. Lastly, I am having her maintain her fish oil-omega-3 fatty acids, Lancets, JOBST RELIEF 30-40MMHG XL, fluocinonide-emollient, fluticasone, B-D ULTRAFINE III SHORT PEN, glucose blood, TRUE METRIX GO GLUCOSE METER, diltiazem, lisinopril, potassium chloride SA, TRULICITY, warfarin, acetaminophen, ferrous sulfate, vitamin B-12, Multiple Vitamins-Minerals (HAIR SKIN AND NAILS FORMULA PO), diphenhydrAMINE, warfarin, albuterol, alendronate, Insulin Detemir, and metFORMIN.  Meds ordered this encounter  Medications  . albuterol (PROVENTIL HFA;VENTOLIN HFA) 108 (90 Base) MCG/ACT inhaler    Sig: Inhale 2 puffs into the lungs every 6 (six) hours as needed for wheezing or shortness of breath.    Dispense:  1 Inhaler    Refill:  0  . alendronate (FOSAMAX) 70 MG tablet    Sig: Take 1 tablet (70 mg total) by mouth every 7 (seven) days.    Dispense:  4 tablet    Refill:  2  . Insulin Detemir (LEVEMIR FLEXTOUCH) 100 UNIT/ML  Pen    Sig: Inject 70 Units into the skin daily with breakfast.    Dispense:  30 mL    Refill:  1  . levothyroxine (SYNTHROID, LEVOTHROID) 50 MCG tablet    Sig: Take 1 tablet (50 mcg total) by mouth daily.    Dispense:  90 tablet    Refill:  1  . metFORMIN (GLUCOPHAGE) 850 MG tablet    Sig: TAKE 1 TABLET (850 MG TOTAL) BY MOUTH 2 (TWO) TIMES DAILY.    Dispense:  180 tablet    Refill:  0  .  DISCONTD: torsemide (DEMADEX) 20 MG tablet    Sig: Take 2 tablets (40 mg total) by mouth daily.    Dispense:  90 tablet    Refill:  3  . torsemide (DEMADEX) 20 MG tablet    Sig: Take 2 tablets every morning and 1 tablet at noon.    Dispense:  270 tablet    Refill:  3  . econazole nitrate 1 % cream    Sig: Apply topically daily.    Dispense:  15 g    Refill:  0     Follow-up: Return in about 2 weeks (around 08/25/2017).  Claretta Fraise, M.D.

## 2017-08-12 LAB — CBC WITH DIFFERENTIAL/PLATELET
BASOS ABS: 0 10*3/uL (ref 0.0–0.2)
Basos: 0 %
EOS (ABSOLUTE): 0.2 10*3/uL (ref 0.0–0.4)
EOS: 3 %
HEMATOCRIT: 30 % — AB (ref 34.0–46.6)
HEMOGLOBIN: 9.2 g/dL — AB (ref 11.1–15.9)
IMMATURE GRANS (ABS): 0 10*3/uL (ref 0.0–0.1)
Immature Granulocytes: 0 %
LYMPHS ABS: 1 10*3/uL (ref 0.7–3.1)
LYMPHS: 15 %
MCH: 27.3 pg (ref 26.6–33.0)
MCHC: 30.7 g/dL — AB (ref 31.5–35.7)
MCV: 89 fL (ref 79–97)
MONOCYTES: 8 %
Monocytes Absolute: 0.6 10*3/uL (ref 0.1–0.9)
NEUTROS ABS: 5.3 10*3/uL (ref 1.4–7.0)
Neutrophils: 74 %
Platelets: 192 10*3/uL (ref 150–379)
RBC: 3.37 x10E6/uL — AB (ref 3.77–5.28)
RDW: 17.2 % — ABNORMAL HIGH (ref 12.3–15.4)
WBC: 7.2 10*3/uL (ref 3.4–10.8)

## 2017-08-12 LAB — LIPID PANEL
CHOLESTEROL TOTAL: 157 mg/dL (ref 100–199)
Chol/HDL Ratio: 3.4 ratio (ref 0.0–4.4)
HDL: 46 mg/dL (ref >39)
LDL Calculated: 80 mg/dL (ref 0–99)
TRIGLYCERIDES: 153 mg/dL — AB (ref 0–149)
VLDL Cholesterol Cal: 31 mg/dL (ref 5–40)

## 2017-08-12 LAB — CMP14+EGFR
ALBUMIN: 3.9 g/dL (ref 3.5–4.8)
ALK PHOS: 140 IU/L — AB (ref 39–117)
ALT: 12 IU/L (ref 0–32)
AST: 16 IU/L (ref 0–40)
Albumin/Globulin Ratio: 1.7 (ref 1.2–2.2)
BUN / CREAT RATIO: 14 (ref 12–28)
BUN: 19 mg/dL (ref 8–27)
Bilirubin Total: 0.4 mg/dL (ref 0.0–1.2)
CO2: 18 mmol/L — AB (ref 20–29)
CREATININE: 1.37 mg/dL — AB (ref 0.57–1.00)
Calcium: 9.2 mg/dL (ref 8.7–10.3)
Chloride: 110 mmol/L — ABNORMAL HIGH (ref 96–106)
GFR, EST AFRICAN AMERICAN: 44 mL/min/{1.73_m2} — AB (ref >59)
GFR, EST NON AFRICAN AMERICAN: 38 mL/min/{1.73_m2} — AB (ref >59)
GLOBULIN, TOTAL: 2.3 g/dL (ref 1.5–4.5)
GLUCOSE: 157 mg/dL — AB (ref 65–99)
Potassium: 5 mmol/L (ref 3.5–5.2)
SODIUM: 145 mmol/L — AB (ref 134–144)
TOTAL PROTEIN: 6.2 g/dL (ref 6.0–8.5)

## 2017-08-12 LAB — TSH: TSH: 1.63 u[IU]/mL (ref 0.450–4.500)

## 2017-08-12 LAB — T4, FREE: FREE T4: 1.52 ng/dL (ref 0.82–1.77)

## 2017-08-13 NOTE — Progress Notes (Signed)
Subjective:     Indication: atrial fibrillation Bleeding signs/symptoms: None Thromboembolic signs/symptoms: None  issed Coumadin doses: Coumadin was held from last check as instructed Medication changes: no Dietary changes: no Bacterial/viral infection: no Other concerns: no   Objective:    INR Today: 1.5 Current dose:  Has not had coumadin since 08/06/17  Assessment:    Subtherapeutic INR for goal of 2-3   Plan:    1. New dose: 3.5 mg daily   2. Next INR: 08/14/17

## 2017-08-14 ENCOUNTER — Ambulatory Visit (INDEPENDENT_AMBULATORY_CARE_PROVIDER_SITE_OTHER): Payer: Medicare HMO | Admitting: *Deleted

## 2017-08-14 DIAGNOSIS — I482 Chronic atrial fibrillation, unspecified: Secondary | ICD-10-CM

## 2017-08-14 DIAGNOSIS — I959 Hypotension, unspecified: Secondary | ICD-10-CM | POA: Diagnosis not present

## 2017-08-14 LAB — COAGUCHEK XS/INR WAIVED
INR: 1.5 — ABNORMAL HIGH (ref 0.9–1.1)
Prothrombin Time: 18 s

## 2017-08-14 MED ORDER — DILTIAZEM HCL ER COATED BEADS 180 MG PO CP24: ORAL_CAPSULE | ORAL | 1 refills | Status: DC

## 2017-08-14 MED ORDER — LISINOPRIL 5 MG PO TABS: 5.0000 mg | ORAL_TABLET | Freq: Every day | ORAL | 1 refills | Status: DC

## 2017-08-14 NOTE — Progress Notes (Signed)
Subjective:     Indication: atrial fibrillation Bleeding signs/symptoms: None Thromboembolic signs/symptoms: None  Missed Coumadin doses: None Medication changes: no Dietary changes: no Bacterial/viral infection: no Other concerns: yes - Venous stasis ulceration right anterior lower leg   Objective:    INR Today: 1.5 Current dose: 3.5mg  daily  UNNA boot removed. No blistering, open wounds, or drainage today. Some darker discoloration. No redness or warmth.   Assessment:    Subtherapeutic INR for goal of 2-3   Plan:    1. New dose: no change   2. Next INR: 08/17/17     Will recheck leg at next visit  Claretta Fraise, MD

## 2017-08-14 NOTE — Patient Instructions (Signed)
Anticoagulation Warfarin Dose Instructions as of 08/14/2017      [name] Tue Wed Thu Fri Sat   New Dose 3.5 mg 3.5 mg 3.5 mg 3.5 mg 3.5 mg 3.5 mg 3.5 mg    Description   Take 3.5 mg daily.  Take 1/2 of a 5mg  tablet and 1 whole 1mg  tablet daily.  Your INR was 1.5 today which is too thick.  Your goal is 2.0 - 3.0  Return on Monday 08/17/17

## 2017-08-17 ENCOUNTER — Encounter: Payer: Medicare HMO | Admitting: *Deleted

## 2017-08-18 ENCOUNTER — Ambulatory Visit (INDEPENDENT_AMBULATORY_CARE_PROVIDER_SITE_OTHER): Payer: Medicare HMO | Admitting: *Deleted

## 2017-08-18 DIAGNOSIS — I482 Chronic atrial fibrillation, unspecified: Secondary | ICD-10-CM

## 2017-08-18 LAB — COAGUCHEK XS/INR WAIVED
INR: 2.1 — ABNORMAL HIGH (ref 0.9–1.1)
PROTHROMBIN TIME: 25.7 s

## 2017-08-18 NOTE — Progress Notes (Addendum)
Subjective:     Indication: atrial fibrillation Bleeding signs/symptoms: None Thromboembolic signs/symptoms: None  Missed Coumadin doses: None Medication changes: no Dietary changes: no Bacterial/viral infection: no Other concerns: no  Objective:    INR Today: 2.1 Current dose: 3.5mg  daily- 1/2 of a 5mg  tablet and 1 whole 1 mg tablet daily    Assessment:    Therapeutic INR for goal of 2-3   Plan:    1. New dose: no change   2. Next INR: 1 month    Chong Sicilian, RN  Claretta Fraise, MD

## 2017-08-18 NOTE — Patient Instructions (Signed)
Anticoagulation Warfarin Dose Instructions as of 08/18/2017      [name] Tue Wed Thu Fri Sat   New Dose 3.5 mg 3.5 mg 3.5 mg 3.5 mg 3.5 mg 3.5 mg 3.5 mg    Description   Take 3.5 mg daily.  Take 1/2 of a 5mg  tablet and 1 whole 1mg  tablet daily.  Your INR was 2.1 today which is normal. Your goal is 2.0 - 3.0

## 2017-08-26 ENCOUNTER — Encounter: Payer: Self-pay | Admitting: Family Medicine

## 2017-08-26 ENCOUNTER — Ambulatory Visit (INDEPENDENT_AMBULATORY_CARE_PROVIDER_SITE_OTHER): Payer: Medicare HMO | Admitting: Family Medicine

## 2017-08-26 VITALS — BP 118/63 | HR 74 | Temp 97.0°F | Ht 65.0 in | Wt 262.0 lb

## 2017-08-26 DIAGNOSIS — Z23 Encounter for immunization: Secondary | ICD-10-CM

## 2017-08-26 DIAGNOSIS — Z7901 Long term (current) use of anticoagulants: Secondary | ICD-10-CM | POA: Diagnosis not present

## 2017-08-26 DIAGNOSIS — I482 Chronic atrial fibrillation, unspecified: Secondary | ICD-10-CM

## 2017-08-26 DIAGNOSIS — N183 Chronic kidney disease, stage 3 unspecified: Secondary | ICD-10-CM

## 2017-08-26 DIAGNOSIS — I5022 Chronic systolic (congestive) heart failure: Secondary | ICD-10-CM | POA: Diagnosis not present

## 2017-08-26 MED ORDER — POTASSIUM CHLORIDE CRYS ER 20 MEQ PO TBCR: 20.0000 meq | EXTENDED_RELEASE_TABLET | Freq: Every day | ORAL | 3 refills | Status: DC

## 2017-08-26 NOTE — Progress Notes (Signed)
Subjective:  Patient ID: [name], female    DOB: [birth year]  Age: 71 y.o. MRN: [id number]  CC: Follow-up (pt here today for 2 week recheck and pt states she is feeling good, no new problems)   HPI [name] presents for recheck of BLE pretibial edema. States she couldn't tolerate the third torsemide tablet daily. It made her kidneys hurt (points to flank areas). She dropped back to two a day and over the last two weeks has still had a lot of urination, decreased swelling and weight loss. The RLE leg ulcer has healed and her INR is therapeutic so she is feeling much better overall. She denies dyspnea. No excessive bruising or bleeding.   Depression screen [facility] 2/9 08/26/2017 08/11/2017 06/08/2017  Decreased Interest 0 0 0  Down, Depressed, Hopeless 0 0 0  PHQ - 2 Score 0 0 0  Altered sleeping - - -  Tired, decreased energy - - -  Change in appetite - - -  Feeling bad or failure about yourself  - - -  Trouble concentrating - - -  Moving slowly or fidgety/restless - - -  Suicidal thoughts - - -  PHQ-9 Score - - -  Difficult doing work/chores - - -    History [name] has a past medical history of Anemia; Arthritis; Asthma; Atrial fibrillation ([place]); CAD (coronary artery disease); Cataract; CHF (congestive heart failure) ([place]); COPD (chronic obstructive pulmonary disease) ([place]); Diabetes mellitus; Gait instability; GERD (gastroesophageal reflux disease); Hyperlipidemia; Hypertension; Obesity; Oxygen dependent; Sleep apnea, obstructive (2008); and Stroke [facility]).   She has a past surgical history that includes Cataract extraction w/ intraocular lens  implant, bilateral; Appendectomy; Cholecystectomy; Carpal tunnel release; Cervical spine surgery; Cesarean section; Umbilical hernia repair; Knee arthroscopy; Colonoscopy (11/22/2002); Upper gastrointestinal endoscopy (11/22/2002); Colonoscopy (11/26/2005); Femur IM nail (Left, 05/08/2013); Hip surgery (Left); and Eye surgery.   Her  family history includes Alzheimer's disease in her father and mother; Asthma in her daughter and grandchild; Breast cancer in her sister; Cancer in her brother, mother, sister, and sister; Colon cancer in her mother; Diabetes in her daughter, mother, sister, and unknown relative; Heart attack in her mother, sister, and unknown relative; Heart disease in her brother, mother, and sister; Kidney disease in her mother; Migraines in her sister.She reports that she has never smoked. She has never used smokeless tobacco. She reports that she does not drink alcohol or use drugs.    ROS Review of Systems  Constitutional: Negative for activity change, appetite change and fever.  HENT: Negative for congestion, rhinorrhea and sore throat.   Eyes: Negative for visual disturbance.  Respiratory: Negative for cough and shortness of breath.   Cardiovascular: Positive for leg swelling. Negative for chest pain and palpitations.  Gastrointestinal: Negative for abdominal pain, diarrhea and nausea.  Genitourinary: Negative for dysuria.  Musculoskeletal: Negative for arthralgias and myalgias.    Objective:  BP 118/63   Pulse 74   Temp (!) 97 F (36.1 C) (Oral)   Ht '5\' 5"'$  (1.651 m)   Wt 262 lb (118.8 kg)   BMI 43.60 kg/m   BP Readings from Last 3 Encounters:  08/26/17 118/63  08/11/17 133/67  06/08/17 (!) 142/64    Wt Readings from Last 3 Encounters:  08/26/17 262 lb (118.8 kg)  08/11/17 275 lb (124.7 kg)  06/08/17 275 lb (124.7 kg)     Physical Exam  Constitutional: She is oriented to person, place, and time. She appears well-developed and well-nourished. No distress.  HENT:  Head: Normocephalic and atraumatic.  Right Ear: External ear normal.  Left Ear: External ear normal.  Nose: Nose normal.  Mouth/Throat: Oropharynx is clear and moist.  Eyes: Pupils are equal, round, and reactive to light. Conjunctivae and EOM are normal.  Neck: Normal range of motion. Neck supple. No thyromegaly  present.  Cardiovascular: Normal rate, regular rhythm and normal heart sounds.   No murmur heard. Pulmonary/Chest: Effort normal and breath sounds normal. No respiratory distress. She has no wheezes. She has no rales.  Abdominal: Soft. Bowel sounds are normal. She exhibits no distension. There is no tenderness.  Musculoskeletal: She exhibits edema (1+ remains in legs, ankles.). She exhibits no tenderness.  Lymphadenopathy:    She has no cervical adenopathy.  Neurological: She is alert and oriented to person, place, and time. She has normal reflexes.  Skin: Skin is warm and dry.  Leg ulcer has closed completely. Slight redness. At site.  Psychiatric: She has a normal mood and affect. Her behavior is normal. Judgment and thought content normal.      Assessment & Plan:   Aalijah was seen today for follow-up.  Diagnoses and all orders for this visit:  Chronic atrial fibrillation ([place]) -     CMP14+EGFR  Need for immunization against influenza -     Flu Vaccine QUAD 36+ mos IM  Chronic renal impairment, stage 3 (moderate)  Chronic systolic congestive heart failure (HCC)  Current use of long term anticoagulation  Other orders -     potassium chloride SA (KLOR-CON M20) 20 MEQ tablet; Take 1 tablet (20 mEq total) by mouth daily.       I am having [name] maintain her fish oil-omega-3 fatty acids, Lancets, JOBST RELIEF 30-40MMHG XL, fluocinonide-emollient, fluticasone, B-D ULTRAFINE III SHORT PEN, glucose blood, TRUE METRIX GO GLUCOSE METER, TRULICITY, warfarin, acetaminophen, ferrous sulfate, vitamin B-12, Multiple Vitamins-Minerals (HAIR SKIN AND NAILS FORMULA PO), diphenhydrAMINE, warfarin, albuterol, alendronate, Insulin Detemir, levothyroxine, metFORMIN, torsemide, econazole nitrate, diltiazem, lisinopril, and potassium chloride SA.  Allergies as of 08/26/2017      Reactions   Lisinopril    R/t to kidney   Codeine Nausea And Vomiting      Medication List         Accurate as of 08/26/17  1:02 PM. Always use your most recent med list.          acetaminophen 500 MG tablet Commonly known as:  TYLENOL Take by mouth daily.   albuterol 108 (90 Base) MCG/ACT inhaler Commonly known as:  PROVENTIL HFA;VENTOLIN HFA Inhale 2 puffs into the lungs every 6 (six) hours as needed for wheezing or shortness of breath.   alendronate 70 MG tablet Commonly known as:  FOSAMAX Take 1 tablet (70 mg total) by mouth every 7 (seven) days.   B-D ULTRAFINE III SHORT PEN 31G X 8 MM Misc Generic drug:  Insulin Pen Needle   diltiazem 180 MG 24 hr capsule Commonly known as:  CARDIZEM CD TAKE 1 CAPSULE (180 MG TOTAL) BY MOUTH DAILY.   diphenhydrAMINE 25 MG tablet Commonly known as:  SOMINEX Take 50 mg by mouth at bedtime as needed for sleep.   econazole nitrate 1 % cream Apply topically daily.   ferrous sulfate 325 (65 FE) MG tablet Take 325 mg by mouth daily with breakfast.   fish oil-omega-3 fatty acids 1000 MG capsule Take 2 g by mouth daily.   fluocinonide-emollient 0.05 % cream Commonly known as:  LIDEX-E Apply to feet after soaking each  night   fluticasone 50 MCG/ACT nasal spray Commonly known as:  FLONASE Place 1 spray into both nostrils 2 (two) times daily as needed for allergies or rhinitis.   glucose blood test strip test sugars twice daily   HAIR SKIN AND NAILS FORMULA PO Take 1 tablet by mouth daily.   Insulin Detemir 100 UNIT/ML Pen Commonly known as:  LEVEMIR FLEXTOUCH Inject 70 Units into the skin daily with breakfast.   JOBST RELIEF 30-40MMHG XL Misc Wear daily for swelling in legs   Lancets Misc   levothyroxine 50 MCG tablet Commonly known as:  SYNTHROID, LEVOTHROID Take 1 tablet (50 mcg total) by mouth daily.   lisinopril 5 MG tablet Commonly known as:  PRINIVIL,ZESTRIL Take 1 tablet (5 mg total) by mouth daily.   metFORMIN 850 MG tablet Commonly known as:  GLUCOPHAGE TAKE 1 TABLET (850 MG TOTAL) BY MOUTH 2 (TWO) TIMES  DAILY.   potassium chloride SA 20 MEQ tablet Commonly known as:  KLOR-CON M20 Take 1 tablet (20 mEq total) by mouth daily.   torsemide 20 MG tablet Commonly known as:  DEMADEX Take 2 tablets every morning and 1 tablet at noon.   TRUE METRIX GO GLUCOSE METER w/Device Kit 1 strip by Does not apply route 2 (two) times daily.   TRULICITY 5.58 OY/3.3UB Sopn Generic drug:  Dulaglutide INJECT 0.75 MG INTO THE SKIN ONCE A WEEK.   vitamin B-12 50 MCG tablet Commonly known as:  CYANOCOBALAMIN Take 50 mcg by mouth daily.   warfarin 5 MG tablet Commonly known as:  COUMADIN TAKE 1 TABLET BY MOUTH EVERY EVENING EXCEPT MONDAY. NO DOSE MONDAY   warfarin 1 MG tablet Commonly known as:  COUMADIN Take 1 tablet (1 mg total) by mouth daily.            Discharge Care Instructions        Start     Ordered   08/26/17 0000  CMP14+EGFR     08/26/17 1112   08/26/17 0000  potassium chloride SA (KLOR-CON M20) 20 MEQ tablet  Daily     08/26/17 1116   08/26/17 0000  Flu Vaccine QUAD 36+ mos IM     08/26/17 1118       Follow-up: No Follow-up on file.  Claretta Fraise, M.D.

## 2017-08-27 LAB — CMP14+EGFR
ALBUMIN: 4 g/dL (ref 3.5–4.8)
ALT: 6 IU/L (ref 0–32)
AST: 16 IU/L (ref 0–40)
Albumin/Globulin Ratio: 1.7 (ref 1.2–2.2)
Alkaline Phosphatase: 134 IU/L — ABNORMAL HIGH (ref 39–117)
BUN / CREAT RATIO: 18 (ref 12–28)
BUN: 27 mg/dL (ref 8–27)
Bilirubin Total: 0.4 mg/dL (ref 0.0–1.2)
CALCIUM: 9 mg/dL (ref 8.7–10.3)
CO2: 21 mmol/L (ref 20–29)
CREATININE: 1.52 mg/dL — AB (ref 0.57–1.00)
Chloride: 106 mmol/L (ref 96–106)
GFR calc Af Amer: 39 mL/min/{1.73_m2} — ABNORMAL LOW (ref >59)
GFR, EST NON AFRICAN AMERICAN: 34 mL/min/{1.73_m2} — AB (ref >59)
GLOBULIN, TOTAL: 2.3 g/dL (ref 1.5–4.5)
Glucose: 101 mg/dL — ABNORMAL HIGH (ref 65–99)
Potassium: 4.7 mmol/L (ref 3.5–5.2)
SODIUM: 143 mmol/L (ref 134–144)
Total Protein: 6.3 g/dL (ref 6.0–8.5)

## 2017-09-09 DIAGNOSIS — J449 Chronic obstructive pulmonary disease, unspecified: Secondary | ICD-10-CM | POA: Diagnosis not present

## 2017-09-17 ENCOUNTER — Ambulatory Visit (INDEPENDENT_AMBULATORY_CARE_PROVIDER_SITE_OTHER): Payer: Medicare HMO | Admitting: *Deleted

## 2017-09-17 DIAGNOSIS — I482 Chronic atrial fibrillation, unspecified: Secondary | ICD-10-CM

## 2017-09-17 LAB — COAGUCHEK XS/INR WAIVED
INR: 4.9 — AB (ref 0.9–1.1)
PROTHROMBIN TIME: 58.3 s

## 2017-09-17 MED ORDER — WARFARIN SODIUM 1 MG PO TABS: 3.0000 mg | ORAL_TABLET | Freq: Every day | ORAL | 0 refills | Status: DC

## 2017-09-17 NOTE — Patient Instructions (Signed)
Anticoagulation Warfarin Dose Instructions as of 09/17/2017      [name] Tue Wed Thu Fri Sat   New Dose 3 mg 3 mg 3 mg 3 mg 3 mg 3 mg 3 mg    Description   Take 3mg  daily by taking 3 of your 1mg  tablets.   Your INR was 4.9 today which is too thin.  Your goal is 2.0 - 3.0

## 2017-09-17 NOTE — Progress Notes (Signed)
Subjective:     Indication: atrial fibrillation Bleeding signs/symptoms: None Thromboembolic signs/symptoms: None  Missed Coumadin doses: None Medication changes: no Dietary changes: no Bacterial/viral infection: no Other concerns: no  The following portions of the patient's history were reviewed and updated as appropriate: allergies and current medications.  Review of Systems Pertinent items are noted in HPI.   Objective:    INR Today: 4.9 Current dose: 3.5mg  daily    Assessment:    Supratherapeutic INR for goal of 2-3   Plan:    1. New dose: 3mg  daily   2. Next INR: 1 week    Discussed with Dr Evette Doffing  Chong Sicilian, RN

## 2017-09-24 ENCOUNTER — Ambulatory Visit (INDEPENDENT_AMBULATORY_CARE_PROVIDER_SITE_OTHER): Payer: Medicare HMO | Admitting: *Deleted

## 2017-09-24 DIAGNOSIS — I482 Chronic atrial fibrillation, unspecified: Secondary | ICD-10-CM

## 2017-09-24 LAB — COAGUCHEK XS/INR WAIVED
INR: 2.7 — ABNORMAL HIGH (ref 0.9–1.1)
Prothrombin Time: 32.2 s

## 2017-09-24 NOTE — Patient Instructions (Signed)
Anticoagulation Warfarin Dose Instructions as of 09/24/2017      Dorene Grebe Tue Wed Thu Fri Sat   New Dose 3 mg 3 mg 3 mg 3 mg 3 mg 3 mg 3 mg    Description   Continue to take 3mg  daily by taking 3 of your 1mg  tablets.   Your INR was 2.7 today.  Your goal was 2.0-3.0  Return on 10/14/17 at 1:30

## 2017-09-28 NOTE — Progress Notes (Signed)
Subjective:     Indication: atrial fibrillation Bleeding signs/symptoms: None Thromboembolic signs/symptoms: None  Missed Coumadin doses: None Medication changes: no Dietary changes: no Bacterial/viral infection: no Other concerns: no  The following portions of the patient's history were reviewed and updated as appropriate: allergies and current medications.  Review of Systems Pertinent items are noted in HPI.   Objective:    INR Today: 2.7 Current dose: 3mg  daily    Assessment:    Therapeutic INR for goal of 2-3   Plan:    1. New dose: no change   2. Next INR: 2 weeks  Chong Sicilian, RN

## 2017-10-04 ENCOUNTER — Other Ambulatory Visit: Payer: Self-pay | Admitting: Family Medicine

## 2017-10-10 DIAGNOSIS — J449 Chronic obstructive pulmonary disease, unspecified: Secondary | ICD-10-CM | POA: Diagnosis not present

## 2017-10-14 ENCOUNTER — Encounter: Payer: Medicare HMO | Admitting: *Deleted

## 2017-10-19 ENCOUNTER — Telehealth: Payer: Self-pay | Admitting: *Deleted

## 2017-10-19 ENCOUNTER — Ambulatory Visit: Payer: Medicare HMO | Admitting: *Deleted

## 2017-10-19 DIAGNOSIS — I482 Chronic atrial fibrillation, unspecified: Secondary | ICD-10-CM

## 2017-10-19 LAB — COAGUCHEK XS/INR WAIVED
INR: 1.6 — ABNORMAL HIGH (ref 0.9–1.1)
Prothrombin Time: 19.7 s

## 2017-10-19 MED ORDER — METFORMIN HCL 500 MG PO TABS: 500.0000 mg | ORAL_TABLET | Freq: Two times a day (BID) | ORAL | 2 refills | Status: DC

## 2017-10-19 NOTE — Telephone Encounter (Signed)
Please contact the patient unfortunately that does happen with metformin. Go back to 500 mg BID. (Send scrip please)  Let me know if glucose starts climbing. Thanks, WS

## 2017-10-19 NOTE — Telephone Encounter (Signed)
Patient aware of medication change. 

## 2017-10-19 NOTE — Telephone Encounter (Signed)
[name] came in for her protime today during lunch. She complains that since increasing her metformin dosage from 500mg  bid to 850mg  bid she has a lot of problems with loose stools and some fecal incontinence. She wants to know if it is possible to change back to 500mg  bid and see if this improves.

## 2017-10-19 NOTE — Progress Notes (Addendum)
Subjective:     Indication: atrial fibrillation Bleeding signs/symptoms: None Thromboembolic signs/symptoms: None  Missed Coumadin doses: None Medication changes: no Dietary changes: yes - Has had a little more greens this past week than usual Bacterial/viral infection: no Other concerns: no  The following portions of the patient's history were reviewed and updated as appropriate: allergies and current medications.  Review of Systems Pertinent items are noted in HPI.   Objective:    INR Today: 1.6 Current dose: 3mg  daily    Assessment:    Subtherapeutic INR for goal of 2-3   Plan:    1. New dose:no change we will just rck at a closer interval 2. Next INR:  rck in 1 week  Discussed with Dr Livia Snellen  Chong Sicilian, RN

## 2017-10-26 ENCOUNTER — Ambulatory Visit (INDEPENDENT_AMBULATORY_CARE_PROVIDER_SITE_OTHER): Payer: Medicare HMO | Admitting: *Deleted

## 2017-10-26 DIAGNOSIS — I482 Chronic atrial fibrillation, unspecified: Secondary | ICD-10-CM

## 2017-10-26 DIAGNOSIS — R791 Abnormal coagulation profile: Secondary | ICD-10-CM

## 2017-10-26 LAB — COAGUCHEK XS/INR WAIVED
INR: >8 (ref 0.9–1.1)
Prothrombin Time: >96 s

## 2017-10-26 LAB — HEMOGLOBIN, FINGERSTICK: Hemoglobin: 8.8 g/dL — ABNORMAL LOW (ref 11.1–15.9)

## 2017-10-27 LAB — PROTIME-INR
INR: 8.8 (ref 0.8–1.2)
Prothrombin Time: 81.2 s — ABNORMAL HIGH (ref 9.1–12.0)

## 2017-10-27 NOTE — Progress Notes (Signed)
Subjective:     Indication: atrial fibrillation Bleeding signs/symptoms: None Thromboembolic signs/symptoms: None  Missed Coumadin doses: None Medication changes: no Dietary changes: no Bacterial/viral infection: no Other concerns: yes - INR has been very irregular. Patient seems knowledgeable and competent and there doesn't seem to be an explanation for the irregularity.   The following portions of the patient's history were reviewed and updated as appropriate: allergies and current medications.  Review of Systems Pertinent items are noted in HPI.   Objective:    INR Today: 8.8 Current dose: 3 mg daily   Hemoglobin: 8.8  No visible signs of bleeding or bruising.  Assessment:    Supratherapeutic INR for goal of 2-3   Low hemoglobin but stable  Plan:    1. New dose: Hold today and tomorrow's dose   2. Next INR: 2 days (Wednesday 11/28)  Blood was drawn for send out INR.   Discussed with Dr Evette Doffing. Considered switching to Xarelto if insurance coverage is adequate but patient's kidney function isn't optimal for Xarelto use.   Chong Sicilian, RN   Hemoglobin stable Hold warfarin, recheck in two days prior to restarting. May need to wait longer. Assunta Found, MD

## 2017-10-28 ENCOUNTER — Encounter: Payer: Self-pay | Admitting: *Deleted

## 2017-10-28 ENCOUNTER — Ambulatory Visit (INDEPENDENT_AMBULATORY_CARE_PROVIDER_SITE_OTHER): Payer: Medicare HMO | Admitting: *Deleted

## 2017-10-28 VITALS — BP 143/71 | HR 67

## 2017-10-28 DIAGNOSIS — I482 Chronic atrial fibrillation, unspecified: Secondary | ICD-10-CM

## 2017-10-28 DIAGNOSIS — R791 Abnormal coagulation profile: Secondary | ICD-10-CM

## 2017-10-28 LAB — COAGUCHEK XS/INR WAIVED
INR: 6 (ref 0.9–1.1)
Prothrombin Time: 72.3 s

## 2017-10-28 LAB — HEMOGLOBIN, FINGERSTICK: Hemoglobin: 8.7 g/dL — ABNORMAL LOW (ref 11.1–15.9)

## 2017-10-28 NOTE — Progress Notes (Signed)
Subjective:     Indication: atrial fibrillation Bleeding signs/symptoms: Mild - Mild nosebleed this morning. Patient was able to stop it easily.  Thromboembolic signs/symptoms: None  Missed Coumadin doses: This week - Held doses as instructed Medication changes: yes - decreased metformin to 500mg  BID Dietary changes: no Bacterial/viral infection: no Other concerns: yes - Concerned about the wild fluctuation in her INR. Patient also complains of increased shortness of breath with exertion. Dyspnea resolves with rest.   The following portions of the patient's history were reviewed and updated as appropriate: allergies and current medications.  Review of Systems Pertinent items are noted in HPI.    Objective:    INR Today: 6.0 Current dose: No coumadin since 10/25/17. Prior to that she was taking 3mg  daily.      Hemoglobin: 8.7  BP 143/71 P 67 O2% 99%  Assessment:    Supratherapeutic INR for goal of 2-3   Plan:    1. New dose: No coumadin today or tomorrow. 2mg  on Friday, Sat, and Sun   2. Next INR: Monday, 11/02/17 with Dr Livia Snellen. He would like for her to bring all of her medications to that appointment.  3.Seek care for any bleeding that is not easily stopped, if shortness of breath worsens, or if you develop any new symptoms.   Discussed with Dr Livia Snellen  Chong Sicilian, RN

## 2017-10-29 LAB — PROTIME-INR
INR: 5.1 — AB (ref 0.8–1.2)
PROTHROMBIN TIME: 48.7 s — AB (ref 9.1–12.0)

## 2017-10-31 ENCOUNTER — Other Ambulatory Visit: Payer: Self-pay | Admitting: Family Medicine

## 2017-11-02 ENCOUNTER — Other Ambulatory Visit: Payer: Self-pay | Admitting: Pediatrics

## 2017-11-03 ENCOUNTER — Ambulatory Visit (INDEPENDENT_AMBULATORY_CARE_PROVIDER_SITE_OTHER): Payer: Medicare HMO | Admitting: Family Medicine

## 2017-11-03 ENCOUNTER — Encounter: Payer: Self-pay | Admitting: Family Medicine

## 2017-11-03 VITALS — BP 121/75 | HR 81 | Temp 97.0°F | Ht 65.0 in | Wt 283.0 lb

## 2017-11-03 DIAGNOSIS — I482 Chronic atrial fibrillation, unspecified: Secondary | ICD-10-CM

## 2017-11-03 DIAGNOSIS — R791 Abnormal coagulation profile: Secondary | ICD-10-CM

## 2017-11-03 DIAGNOSIS — Z7901 Long term (current) use of anticoagulants: Secondary | ICD-10-CM

## 2017-11-03 LAB — COAGUCHEK XS/INR WAIVED
INR: 4.6 — ABNORMAL HIGH (ref 0.9–1.1)
PROTHROMBIN TIME: 54.8 s

## 2017-11-03 MED ORDER — WARFARIN SODIUM 5 MG PO TABS: 2.5000 mg | ORAL_TABLET | Freq: Every day | ORAL | 0 refills | Status: DC

## 2017-11-03 NOTE — Progress Notes (Signed)
Subjective:  Patient ID: [name], female    DOB: 1946/05/14  Age: 71 y.o. MRN: [id number]  CC: Follow-up (pt here today for Protime )   HPI CAMBRI PLOURDE presents for multiple supratherapeutic protimes recently.  The patient denies any bleeding other than 2 nosebleeds that were self-limited.  Direct pressure relieved them within a few minutes.  The most recent was yesterday morning in the home.  She says she has cut down on the number of green vegetables she eats but other than that diet has not changed.  She has not changed her dose of Coumadin.  It has directed by Coumadin clinic.  Her INR today has dropped to 4.5 from the elevated levels noted last week.  She has been advised to stop her Coumadin completely until today.  However she did take half of a tablet last night.  She does have some bruising on the right forearm from where she was scratched by her cat.  Depression screen [facility] 2/9 11/03/2017 08/26/2017 08/11/2017  Decreased Interest 0 0 0  Down, Depressed, Hopeless 0 0 0  PHQ - 2 Score 0 0 0  Altered sleeping - - -  Tired, decreased energy - - -  Change in appetite - - -  Feeling bad or failure about yourself  - - -  Trouble concentrating - - -  Moving slowly or fidgety/restless - - -  Suicidal thoughts - - -  PHQ-9 Score - - -  Difficult doing work/chores - - -  Some recent data might be hidden    History Ayani has a past medical history of Anemia, Arthritis, Asthma, Atrial fibrillation ([place]), CAD (coronary artery disease), Cataract, CHF (congestive heart failure) ([place]), COPD (chronic obstructive pulmonary disease) ([place]), Diabetes mellitus, Gait instability, GERD (gastroesophageal reflux disease), Hyperlipidemia, Hypertension, Obesity, Oxygen dependent, Sleep apnea, obstructive (2008), and Stroke ([place]).   She has a past surgical history that includes Cataract extraction w/ intraocular lens  implant, bilateral; Appendectomy; Cholecystectomy; Carpal tunnel release;  Cervical spine surgery; Cesarean section; Umbilical hernia repair; Knee arthroscopy; Colonoscopy (11/22/2002); Upper gastrointestinal endoscopy (11/22/2002); Colonoscopy (11/26/2005); Femur IM nail (Left, 05/08/2013); Hip surgery (Left); and Eye surgery.   Her family history includes Alzheimer's disease in her father and mother; Asthma in her daughter and grandchild; Breast cancer in her sister; Cancer in her brother, mother, sister, and sister; Colon cancer in her mother; Diabetes in her daughter, mother, sister, and unknown relative; Heart attack in her mother, sister, and unknown relative; Heart disease in her brother, mother, and sister; Kidney disease in her mother; Migraines in her sister.She reports that  has never smoked. she has never used smokeless tobacco. She reports that she does not drink alcohol or use drugs.    ROS Review of Systems  Constitutional: Negative for activity change, appetite change and fever.  HENT: Negative for congestion, rhinorrhea and sore throat.   Eyes: Negative for visual disturbance.  Respiratory: Negative for cough and shortness of breath.   Cardiovascular: Negative for chest pain and palpitations.  Gastrointestinal: Negative for abdominal pain, diarrhea and nausea.  Genitourinary: Negative for dysuria.  Musculoskeletal: Negative for arthralgias and myalgias.  Hematological: Negative for adenopathy. Does not bruise/bleed easily.  Psychiatric/Behavioral: Negative for agitation, confusion and dysphoric mood.    Objective:  BP 121/75   Pulse 81   Temp (!) 97 F (36.1 C) (Oral)   Ht 5' 5"  (1.651 m)   Wt 283 lb (128.4 kg)   BMI 47.09 kg/m  BP Readings from Last 3 Encounters:  11/03/17 121/75  10/28/17 (!) 143/71  08/26/17 118/63    Wt Readings from Last 3 Encounters:  11/03/17 283 lb (128.4 kg)  08/26/17 262 lb (118.8 kg)  08/11/17 275 lb (124.7 kg)     Physical Exam  Constitutional: She is oriented to person, place, and time. She appears  well-developed and well-nourished. No distress.  HENT:  Head: Normocephalic and atraumatic.  Eyes: Conjunctivae are normal. Pupils are equal, round, and reactive to light.  Neck: Normal range of motion. Neck supple. No thyromegaly present.  Cardiovascular: Normal rate, regular rhythm and normal heart sounds.  No murmur heard. Pulmonary/Chest: Effort normal and breath sounds normal. No respiratory distress. She has no wheezes. She has no rales.  Abdominal: Soft. Bowel sounds are normal. She exhibits no distension. There is no tenderness.  Musculoskeletal: Normal range of motion.  Lymphadenopathy:    She has no cervical adenopathy.  Neurological: She is alert and oriented to person, place, and time.  Skin: Skin is warm and dry.  Psychiatric: She has a normal mood and affect. Her behavior is normal. Judgment and thought content normal.      Assessment & Plan:   Raymona was seen today for follow-up.  Diagnoses and all orders for this visit:  Current use of long term anticoagulation -     CoaguChek XS/INR Waived  Chronic atrial fibrillation (HCC)  Elevated INR       I have discontinued Carely L. Colin's fluticasone. I am also having her maintain her fish oil-omega-3 fatty acids, Lancets, JOBST RELIEF 30-40MMHG XL, fluocinonide-emollient, B-D ULTRAFINE III SHORT PEN, glucose blood, TRUE METRIX GO GLUCOSE METER, TRULICITY, warfarin, acetaminophen, ferrous sulfate, vitamin B-12, Multiple Vitamins-Minerals (HAIR SKIN AND NAILS FORMULA PO), diphenhydrAMINE, albuterol, alendronate, levothyroxine, torsemide, econazole nitrate, diltiazem, lisinopril, potassium chloride SA, warfarin, metFORMIN, LEVEMIR FLEXTOUCH, and warfarin.  Allergies as of 11/03/2017      Reactions   Lisinopril    R/t to kidney   Codeine Nausea And Vomiting      Medication List        Accurate as of 11/03/17 10:56 PM. Always use your most recent med list.          acetaminophen 500 MG tablet Commonly  known as:  TYLENOL Take by mouth daily.   albuterol 108 (90 Base) MCG/ACT inhaler Commonly known as:  PROVENTIL HFA;VENTOLIN HFA Inhale 2 puffs into the lungs every 6 (six) hours as needed for wheezing or shortness of breath.   alendronate 70 MG tablet Commonly known as:  FOSAMAX Take 1 tablet (70 mg total) by mouth every 7 (seven) days.   B-D ULTRAFINE III SHORT PEN 31G X 8 MM Misc Generic drug:  Insulin Pen Needle   diltiazem 180 MG 24 hr capsule Commonly known as:  CARDIZEM CD TAKE 1 CAPSULE (180 MG TOTAL) BY MOUTH DAILY.   diphenhydrAMINE 25 MG tablet Commonly known as:  SOMINEX Take 50 mg by mouth at bedtime as needed for sleep.   econazole nitrate 1 % cream Apply topically daily.   ferrous sulfate 325 (65 FE) MG tablet Take 325 mg by mouth daily with breakfast.   fish oil-omega-3 fatty acids 1000 MG capsule Take 2 g by mouth daily.   fluocinonide-emollient 0.05 % cream Commonly known as:  LIDEX-E Apply to feet after soaking each night   glucose blood test strip test sugars twice daily   HAIR SKIN AND NAILS FORMULA PO Take 1 tablet by mouth daily.  JOBST RELIEF 30-40MMHG XL Misc Wear daily for swelling in legs   Lancets Misc   LEVEMIR FLEXTOUCH 100 UNIT/ML Pen Generic drug:  Insulin Detemir INJECT 70 UNITS INTO THE SKIN DAILY WITH BREAKFAST.   levothyroxine 50 MCG tablet Commonly known as:  SYNTHROID, LEVOTHROID Take 1 tablet (50 mcg total) by mouth daily.   lisinopril 5 MG tablet Commonly known as:  PRINIVIL,ZESTRIL Take 1 tablet (5 mg total) by mouth daily.   metFORMIN 500 MG tablet Commonly known as:  GLUCOPHAGE Take 1 tablet (500 mg total) 2 (two) times daily with a meal by mouth.   potassium chloride SA 20 MEQ tablet Commonly known as:  KLOR-CON M20 Take 1 tablet (20 mEq total) by mouth daily.   torsemide 20 MG tablet Commonly known as:  DEMADEX Take 2 tablets every morning and 1 tablet at noon.   TRUE METRIX GO GLUCOSE METER w/Device  Kit 1 strip by Does not apply route 2 (two) times daily.   TRULICITY 1.61 WR/6.0AV Sopn Generic drug:  Dulaglutide INJECT 0.75 MG INTO THE SKIN ONCE A WEEK.   vitamin B-12 50 MCG tablet Commonly known as:  CYANOCOBALAMIN Take 50 mcg by mouth daily.   warfarin 5 MG tablet Commonly known as:  COUMADIN Take as directed by the anticoagulation clinic. If you are unsure how to take this medication, talk to your nurse or doctor. Original instructions:  TAKE 1 TABLET BY MOUTH EVERY EVENING EXCEPT MONDAY. NO DOSE MONDAY   warfarin 1 MG tablet Commonly known as:  COUMADIN Take as directed by the anticoagulation clinic. If you are unsure how to take this medication, talk to your nurse or doctor. Original instructions:  TAKE 1 TABLET BY MOUTH EVERY DAY   warfarin 1 MG tablet Commonly known as:  COUMADIN Take as directed by the anticoagulation clinic. If you are unsure how to take this medication, talk to your nurse or doctor. Original instructions:  TAKE 3 TABLETS (3 MG TOTAL) BY MOUTH DAILY.      Coumadin 2.5 mg daily   Follow-up: Return in about 3 days (around 11/06/2017) for Anticoagulation.  Claretta Fraise, M.D.

## 2017-11-03 NOTE — Patient Instructions (Signed)
Take 2.5mg  of your Warfarin every evening.

## 2017-11-06 ENCOUNTER — Encounter: Payer: Self-pay | Admitting: Family Medicine

## 2017-11-06 ENCOUNTER — Ambulatory Visit (INDEPENDENT_AMBULATORY_CARE_PROVIDER_SITE_OTHER): Payer: Medicare HMO | Admitting: Family Medicine

## 2017-11-06 VITALS — BP 127/63 | HR 82 | Ht 65.0 in

## 2017-11-06 DIAGNOSIS — E039 Hypothyroidism, unspecified: Secondary | ICD-10-CM

## 2017-11-06 DIAGNOSIS — I482 Chronic atrial fibrillation, unspecified: Secondary | ICD-10-CM

## 2017-11-06 DIAGNOSIS — E1140 Type 2 diabetes mellitus with diabetic neuropathy, unspecified: Secondary | ICD-10-CM | POA: Diagnosis not present

## 2017-11-06 DIAGNOSIS — Z7901 Long term (current) use of anticoagulants: Secondary | ICD-10-CM

## 2017-11-06 LAB — COAGUCHEK XS/INR WAIVED
INR: 3.8 — ABNORMAL HIGH (ref 0.9–1.1)
Prothrombin Time: 45.8 s

## 2017-11-06 LAB — BAYER DCA HB A1C WAIVED: HB A1C (BAYER DCA - WAIVED): 6.4 % (ref <7.0)

## 2017-11-06 NOTE — Progress Notes (Signed)
Subjective:  Patient ID: [name], female    DOB: [birth year]  Age: 71 y.o. MRN: [id number]  CC: Follow-up (pt here today for Protime )   HPI [name] presents for Patient in for follow-up of atrial fibrillation. Patient denies any recent bouts of chest pain or palpitations. Additionally, patient is taking anticoagulants. Patient denies any recent excessive bleeding episodes including epistaxis, bleeding from the gums, genitalia, rectal bleeding or hematuria. Additionally there has been no excessive bruising.  Patient has had multiple elevated readings recently.  Her dose was cut back to 2.5 mg of Coumadin daily.  Depression screen [facility] 2/9 11/06/2017 11/03/2017 08/26/2017  Decreased Interest 0 0 0  Down, Depressed, Hopeless 0 0 0  PHQ - 2 Score 0 0 0  Altered sleeping - - -  Tired, decreased energy - - -  Change in appetite - - -  Feeling bad or failure about yourself  - - -  Trouble concentrating - - -  Moving slowly or fidgety/restless - - -  Suicidal thoughts - - -  PHQ-9 Score - - -  Difficult doing work/chores - - -  Some recent data might be hidden    History [name] has a past medical history of Anemia, Arthritis, Asthma, Atrial fibrillation ([place]), CAD (coronary artery disease), Cataract, CHF (congestive heart failure) ([place]), COPD (chronic obstructive pulmonary disease) ([place]), Diabetes mellitus, Gait instability, GERD (gastroesophageal reflux disease), Hyperlipidemia, Hypertension, Obesity, Oxygen dependent, Sleep apnea, obstructive (2008), and Stroke ([place]).   She has a past surgical history that includes Cataract extraction w/ intraocular lens  implant, bilateral; Appendectomy; Cholecystectomy; Carpal tunnel release; Cervical spine surgery; Cesarean section; Umbilical hernia repair; Knee arthroscopy; Colonoscopy (11/22/2002); Upper gastrointestinal endoscopy (11/22/2002); Colonoscopy (11/26/2005); Femur IM nail (Left, 05/08/2013); Hip surgery (Left); and Eye surgery.    Her family history includes Alzheimer's disease in her father and mother; Asthma in her daughter and grandchild; Breast cancer in her sister; Cancer in her brother, mother, sister, and sister; Colon cancer in her mother; Diabetes in her daughter, mother, sister, and unknown relative; Heart attack in her mother, sister, and unknown relative; Heart disease in her brother, mother, and sister; Kidney disease in her mother; Migraines in her sister.She reports that  has never smoked. she has never used smokeless tobacco. She reports that she does not drink alcohol or use drugs.    ROS Review of Systems  Constitutional: Negative for activity change, appetite change and fever.  HENT: Negative for congestion, rhinorrhea and sore throat.   Eyes: Negative for visual disturbance.  Respiratory: Negative for cough and shortness of breath.   Cardiovascular: Negative for chest pain and palpitations.  Gastrointestinal: Negative for abdominal pain, diarrhea and nausea.  Genitourinary: Negative for dysuria.  Musculoskeletal: Negative for arthralgias and myalgias.    Objective:  BP 127/63   Pulse 82   Ht _0  (1.651 m)   BMI 47.09 kg/m   BP Readings from Last 3 Encounters:  11/06/17 127/63  11/03/17 121/75  10/28/17 (!) 143/71    Wt Readings from Last 3 Encounters:  11/03/17 283 lb (128.4 kg)  08/26/17 262 lb (118.8 kg)  08/11/17 275 lb (124.7 kg)     Physical Exam  Constitutional: She is oriented to person, place, and time. She appears well-developed and well-nourished. No distress.  HENT:  Head: Normocephalic and atraumatic.  Eyes: Conjunctivae are normal. Pupils are equal, round, and reactive to light.  Neck: Normal range of motion. Neck supple. No thyromegaly present.  Cardiovascular: Normal rate, regular rhythm and normal heart sounds.  No murmur heard. Pulmonary/Chest: Effort normal and breath sounds normal. No respiratory distress. She has no wheezes. She has no rales.    Abdominal: Soft. Bowel sounds are normal. She exhibits no distension. There is no tenderness.  Musculoskeletal: Normal range of motion.  Lymphadenopathy:    She has no cervical adenopathy.  Neurological: She is alert and oriented to person, place, and time.  Skin: Skin is warm and dry.  Psychiatric: She has a normal mood and affect. Her behavior is normal. Judgment and thought content normal.      Assessment & Plan:   [name] was seen today for follow-up.  Diagnoses and all orders for this visit:  Current use of long term anticoagulation -     CoaguChek XS/INR Waived  Hypothyroidism, unspecified type -     TSH -     T4, Free  Type 2 diabetes mellitus with diabetic neuropathy, unspecified whether long term insulin use (HCC) -     CBC with Differential/Platelet -     CMP14+EGFR -     Microalbumin / creatinine urine ratio -     Urinalysis -     Bayer DCA Hb A1c Waived  Chronic atrial fibrillation ([place])       I am having [name] maintain her fish oil-omega-3 fatty acids, Lancets, JOBST RELIEF 30-40MMHG XL, fluocinonide-emollient, B-D ULTRAFINE III SHORT PEN, glucose blood, TRUE METRIX GO GLUCOSE METER, TRULICITY, acetaminophen, ferrous sulfate, vitamin B-12, Multiple Vitamins-Minerals (HAIR SKIN AND NAILS FORMULA PO), diphenhydrAMINE, albuterol, alendronate, levothyroxine, torsemide, econazole nitrate, diltiazem, lisinopril, potassium chloride SA, metFORMIN, LEVEMIR FLEXTOUCH, and warfarin.  Allergies as of 11/06/2017      Reactions   Lisinopril    R/t to kidney   Codeine Nausea And Vomiting      Medication List        Accurate as of 11/06/17 11:59 PM. Always use your most recent med list.          acetaminophen 500 MG tablet Commonly known as:  TYLENOL Take by mouth daily.   albuterol 108 (90 Base) MCG/ACT inhaler Commonly known as:  PROVENTIL HFA;VENTOLIN HFA Inhale 2 puffs into the lungs every 6 (six) hours as needed for wheezing or shortness of  breath.   alendronate 70 MG tablet Commonly known as:  FOSAMAX Take 1 tablet (70 mg total) by mouth every 7 (seven) days.   B-D ULTRAFINE III SHORT PEN 31G X 8 MM Misc Generic drug:  Insulin Pen Needle   diltiazem 180 MG 24 hr capsule Commonly known as:  CARDIZEM CD TAKE 1 CAPSULE (180 MG TOTAL) BY MOUTH DAILY.   diphenhydrAMINE 25 MG tablet Commonly known as:  SOMINEX Take 50 mg by mouth at bedtime as needed for sleep.   econazole nitrate 1 % cream Apply topically daily.   ferrous sulfate 325 (65 FE) MG tablet Take 325 mg by mouth daily with breakfast.   fish oil-omega-3 fatty acids 1000 MG capsule Take 2 g by mouth daily.   fluocinonide-emollient 0.05 % cream Commonly known as:  LIDEX-E Apply to feet after soaking each night   glucose blood test strip test sugars twice daily   HAIR SKIN AND NAILS FORMULA PO Take 1 tablet by mouth daily.   JOBST RELIEF 30-40MMHG XL Misc Wear daily for swelling in legs   Lancets Misc   LEVEMIR FLEXTOUCH 100 UNIT/ML Pen Generic drug:  Insulin Detemir INJECT 70 UNITS INTO THE SKIN DAILY WITH  BREAKFAST.   levothyroxine 50 MCG tablet Commonly known as:  SYNTHROID, LEVOTHROID Take 1 tablet (50 mcg total) by mouth daily.   lisinopril 5 MG tablet Commonly known as:  PRINIVIL,ZESTRIL Take 1 tablet (5 mg total) by mouth daily.   metFORMIN 500 MG tablet Commonly known as:  GLUCOPHAGE Take 1 tablet (500 mg total) 2 (two) times daily with a meal by mouth.   potassium chloride SA 20 MEQ tablet Commonly known as:  KLOR-CON M20 Take 1 tablet (20 mEq total) by mouth daily.   torsemide 20 MG tablet Commonly known as:  DEMADEX Take 2 tablets every morning and 1 tablet at noon.   TRUE METRIX GO GLUCOSE METER w/Device Kit 1 strip by Does not apply route 2 (two) times daily.   TRULICITY 5.49 IY/6.4BR Sopn Generic drug:  Dulaglutide INJECT 0.75 MG INTO THE SKIN ONCE A WEEK.   vitamin B-12 50 MCG tablet Commonly known as:   CYANOCOBALAMIN Take 50 mcg by mouth daily.   warfarin 5 MG tablet Commonly known as:  COUMADIN Take as directed by the anticoagulation clinic. If you are unsure how to take this medication, talk to your nurse or doctor. Original instructions:  Take 0.5 tablets (2.5 mg total) by mouth daily at 6 PM for 3 days. TAKE 1 TABLET BY MOUTH EVERY EVENING EXCEPT MONDAY. NO DOSE MONDAY        Follow-up: Return in about 1 week (around 11/13/2017) for diabetes.  Claretta Fraise, M.D.

## 2017-11-07 ENCOUNTER — Other Ambulatory Visit: Payer: Self-pay | Admitting: Family

## 2017-11-08 ENCOUNTER — Encounter: Payer: Self-pay | Admitting: Family Medicine

## 2017-11-09 DIAGNOSIS — J449 Chronic obstructive pulmonary disease, unspecified: Secondary | ICD-10-CM | POA: Diagnosis not present

## 2017-11-09 LAB — CBC WITH DIFFERENTIAL/PLATELET
BASOS: 0 %
Basophils Absolute: 0 10*3/uL (ref 0.0–0.2)
EOS (ABSOLUTE): 0.1 10*3/uL (ref 0.0–0.4)
EOS: 2 %
HEMATOCRIT: 27.2 % — AB (ref 34.0–46.6)
Hemoglobin: 8.8 g/dL — CL (ref 11.1–15.9)
Immature Grans (Abs): 0 10*3/uL (ref 0.0–0.1)
Immature Granulocytes: 0 %
LYMPHS ABS: 0.6 10*3/uL — AB (ref 0.7–3.1)
Lymphs: 11 %
MCH: 28.5 pg (ref 26.6–33.0)
MCHC: 32.4 g/dL (ref 31.5–35.7)
MCV: 88 fL (ref 79–97)
MONOS ABS: 0.4 10*3/uL (ref 0.1–0.9)
Monocytes: 7 %
NEUTROS ABS: 4.5 10*3/uL (ref 1.4–7.0)
Neutrophils: 80 %
Platelets: 179 10*3/uL (ref 150–379)
RBC: 3.09 x10E6/uL — AB (ref 3.77–5.28)
RDW: 16.8 % — AB (ref 12.3–15.4)
WBC: 5.7 10*3/uL (ref 3.4–10.8)

## 2017-11-09 LAB — CMP14+EGFR
A/G RATIO: 1.5 (ref 1.2–2.2)
ALBUMIN: 3.7 g/dL (ref 3.5–4.8)
ALK PHOS: 153 IU/L — AB (ref 39–117)
ALT: 11 IU/L (ref 0–32)
AST: 12 IU/L (ref 0–40)
BILIRUBIN TOTAL: 0.6 mg/dL (ref 0.0–1.2)
BUN / CREAT RATIO: 13 (ref 12–28)
BUN: 16 mg/dL (ref 8–27)
CO2: 22 mmol/L (ref 20–29)
Calcium: 9 mg/dL (ref 8.7–10.3)
Chloride: 111 mmol/L — ABNORMAL HIGH (ref 96–106)
Creatinine, Ser: 1.27 mg/dL — ABNORMAL HIGH (ref 0.57–1.00)
GFR calc Af Amer: 48 mL/min/{1.73_m2} — ABNORMAL LOW (ref >59)
GFR calc non Af Amer: 42 mL/min/{1.73_m2} — ABNORMAL LOW (ref >59)
GLOBULIN, TOTAL: 2.5 g/dL (ref 1.5–4.5)
Glucose: 120 mg/dL — ABNORMAL HIGH (ref 65–99)
Potassium: 5.3 mmol/L — ABNORMAL HIGH (ref 3.5–5.2)
SODIUM: 145 mmol/L — AB (ref 134–144)
Total Protein: 6.2 g/dL (ref 6.0–8.5)

## 2017-11-09 LAB — T4, FREE: FREE T4: 1.52 ng/dL (ref 0.82–1.77)

## 2017-11-09 LAB — TSH: TSH: 2.54 u[IU]/mL (ref 0.450–4.500)

## 2017-11-10 ENCOUNTER — Encounter: Payer: Medicare HMO | Admitting: *Deleted

## 2017-11-11 ENCOUNTER — Encounter: Payer: Medicare HMO | Admitting: *Deleted

## 2017-11-16 ENCOUNTER — Ambulatory Visit: Payer: Medicare HMO | Admitting: *Deleted

## 2017-11-16 DIAGNOSIS — I482 Chronic atrial fibrillation, unspecified: Secondary | ICD-10-CM

## 2017-11-16 DIAGNOSIS — R300 Dysuria: Secondary | ICD-10-CM

## 2017-11-16 LAB — MICROSCOPIC EXAMINATION
RBC MICROSCOPIC, UA: NONE SEEN /HPF (ref 0–?)
Renal Epithel, UA: NONE SEEN /hpf

## 2017-11-16 LAB — URINALYSIS, ROUTINE W REFLEX MICROSCOPIC
BILIRUBIN UA: NEGATIVE
GLUCOSE, UA: NEGATIVE
KETONES UA: NEGATIVE
NITRITE UA: NEGATIVE
RBC UA: NEGATIVE
UUROB: 0.2 mg/dL (ref 0.2–1.0)
pH, UA: 5 (ref 5.0–7.5)

## 2017-11-16 LAB — COAGUCHEK XS/INR WAIVED
INR: 2 — ABNORMAL HIGH (ref 0.9–1.1)
PROTHROMBIN TIME: 23.5 s

## 2017-11-16 NOTE — Progress Notes (Signed)
Subjective:     Indication: atrial fibrillation Bleeding signs/symptoms: None Thromboembolic signs/symptoms: None  Missed Coumadin doses: None Medication changes: no Dietary changes: no Bacterial/viral infection: no Other concerns: yes - Patient has seen Dr Livia Snellen for her last 2 INRs because she is so irregular. She has 1 mg tablets and 5 mg tablets at home. She was supposed to have been on 3 of the 1mg  tablets daily and I asked her to put the 5mg  tablets away. She brought her medications in at her last visit and Dr Verlin Grills nurse Florentina Jenny noticed that the 1 mg and 5 mg tablets were similar in color, shape and size. I reviewed the patient's last note. Dr Livia Snellen removed the 1mg  tablets from her med list and has her taking 1/2 of the 5mg  tablets. When I talked to the patient about this she said that she is taking 1/2 of the 1mg  tablet and not 1/2 of the 5mg  tablet. I'm unsure of what she is doing at this time.  The following portions of the patient's history were reviewed and updated as appropriate: allergies and current medications.  Review of Systems Pertinent items are noted in HPI.   Objective:    INR Today: 2.0 Current dose: Patient reports .5mg  but her last office note says that she should be on 2.5mg     Assessment:    Therapeutic INR for goal of 2-3   Plan:    1. New dose: no change   2. Next INR: 1 week    I asked her to continue taking her medication as she has the past 2 weeks because she is within normal range. I requested that she bring both bottles to her appointment next week so that I can compare them. I am inclined to sending a new prescription to the pharmacy and discarding her current pills so that we can start fresh with a known dose. [facility] uses [facility] and I do not think they provide pill packaging. I will talk with her at the next visit and go from there.

## 2017-11-25 ENCOUNTER — Ambulatory Visit: Payer: Medicare HMO | Admitting: *Deleted

## 2017-11-25 DIAGNOSIS — I482 Chronic atrial fibrillation, unspecified: Secondary | ICD-10-CM

## 2017-11-25 LAB — COAGUCHEK XS/INR WAIVED
INR: 1.6 — AB (ref 0.9–1.1)
Prothrombin Time: 19.1 s

## 2017-11-25 NOTE — Progress Notes (Signed)
Subjective:     Indication: atrial fibrillation Bleeding signs/symptoms: None Thromboembolic signs/symptoms: None  Missed Coumadin doses: None Medication changes: no Dietary changes: no Bacterial/viral infection: no Other concerns: yes - Patient has both coumadin 1mg  and 5mg  tablets. She is currently prescribed 1/2 of the 5mg  tablets. At her last visit she was unsure of her dosage and thought she was taking 1/2 of the 1 mg tablets. Her INR has been very irregular for months. I asked her to bring in both medication bottles today. Both bottles were labelled 5mg . In one bottle there were all 5mg  tablets. In the other bother there was a mixture of 5mg  and 1mg  tablets. I separated them so that there is now a bottle of 5mg  and a bottle of 1mg  tablets. I taped a note to the 1mg  bottle that says "Do Not Take" and wrote today's date. I taped a note to the 5mg  bottle that says "take 1/2 tablet" and put today's date.   The following portions of the patient's history were reviewed and updated as appropriate: allergies and current medications.  Review of Systems Pertinent items are noted in HPI.   Objective:    INR Today: 1.6 Current dose: ? (see above note)    Assessment:    Subtherapeutic INR for goal of 2-3   Plan:    1. New dose: no change  Since we are uncertain of what she is taking I left the dose of 2.5mg  daily the same. Will send to Dr Livia Snellen for review. If he feels differently we will contact the patient at home with his instructions.  2. Next INR: 1 week at visit with Dr Livia Snellen  Chong Sicilian, RN

## 2017-11-26 ENCOUNTER — Ambulatory Visit (INDEPENDENT_AMBULATORY_CARE_PROVIDER_SITE_OTHER): Payer: Medicare HMO

## 2017-11-26 ENCOUNTER — Ambulatory Visit: Payer: Medicare HMO | Admitting: Family Medicine

## 2017-11-26 ENCOUNTER — Ambulatory Visit: Payer: Medicare HMO | Admitting: Family

## 2017-11-26 ENCOUNTER — Encounter: Payer: Self-pay | Admitting: Family

## 2017-11-26 VITALS — BP 190/95 | Temp 97.1°F | Ht 65.0 in

## 2017-11-26 DIAGNOSIS — J90 Pleural effusion, not elsewhere classified: Secondary | ICD-10-CM

## 2017-11-26 DIAGNOSIS — S8992XA Unspecified injury of left lower leg, initial encounter: Secondary | ICD-10-CM | POA: Diagnosis not present

## 2017-11-26 DIAGNOSIS — S20212A Contusion of left front wall of thorax, initial encounter: Secondary | ICD-10-CM | POA: Diagnosis not present

## 2017-11-26 DIAGNOSIS — S8002XA Contusion of left knee, initial encounter: Secondary | ICD-10-CM

## 2017-11-26 DIAGNOSIS — W19XXXA Unspecified fall, initial encounter: Secondary | ICD-10-CM

## 2017-11-26 DIAGNOSIS — M25562 Pain in left knee: Secondary | ICD-10-CM | POA: Diagnosis not present

## 2017-11-26 NOTE — Patient Instructions (Signed)
Fall Prevention in the Home Falls can cause injuries and can affect people from all age groups. There are many simple things that you can do to make your home safe and to help prevent falls. What can I do on the outside of my home?  Regularly repair the edges of walkways and driveways and fix any cracks.  Remove high doorway thresholds.  Trim any shrubbery on the main path into your home.  Use bright outdoor lighting.  Clear walkways of debris and clutter, including tools and rocks.  Regularly check that handrails are securely fastened and in good repair. Both sides of any steps should have handrails.  Install guardrails along the edges of any raised decks or porches.  Have leaves, snow, and ice cleared regularly.  Use sand or salt on walkways during winter months.  In the garage, clean up any spills right away, including grease or oil spills. What can I do in the bathroom?  Use night lights.  Install grab bars by the toilet and in the tub and shower. Do not use towel bars as grab bars.  Use non-skid mats or decals on the floor of the tub or shower.  If you need to sit down while you are in the shower, use a plastic, non-slip stool.  Keep the floor dry. Immediately clean up any water that spills on the floor.  Remove soap buildup in the tub or shower on a regular basis.  Attach bath mats securely with double-sided non-slip rug tape.  Remove throw rugs and other tripping hazards from the floor. What can I do in the bedroom?  Use night lights.  Make sure that a bedside light is easy to reach.  Do not use oversized bedding that drapes onto the floor.  Have a firm chair that has side arms to use for getting dressed.  Remove throw rugs and other tripping hazards from the floor. What can I do in the kitchen?  Clean up any spills right away.  Avoid walking on wet floors.  Place frequently used items in easy-to-reach places.  If you need to reach for something above  you, use a sturdy step stool that has a grab bar.  Keep electrical cables out of the way.  Do not use floor polish or wax that makes floors slippery. If you have to use wax, make sure that it is non-skid floor wax.  Remove throw rugs and other tripping hazards from the floor. What can I do in the stairways?  Do not leave any items on the stairs.  Make sure that there are handrails on both sides of the stairs. Fix handrails that are broken or loose. Make sure that handrails are as long as the stairways.  Check any carpeting to make sure that it is firmly attached to the stairs. Fix any carpet that is loose or worn.  Avoid having throw rugs at the top or bottom of stairways, or secure the rugs with carpet tape to prevent them from moving.  Make sure that you have a light switch at the top of the stairs and the bottom of the stairs. If you do not have them, have them installed. What are some other fall prevention tips?  Wear closed-toe shoes that fit well and support your feet. Wear shoes that have rubber soles or low heels.  When you use a stepladder, make sure that it is completely opened and that the sides are firmly locked. Have someone hold the ladder while you are using   it. Do not climb a closed stepladder.  Add color or contrast paint or tape to grab bars and handrails in your home. Place contrasting color strips on the first and last steps.  Use mobility aids as needed, such as canes, walkers, scooters, and crutches.  Turn on lights if it is dark. Replace any light bulbs that burn out.  Set up furniture so that there are clear paths. Keep the furniture in the same spot.  Fix any uneven floor surfaces.  Choose a carpet design that does not hide the edge of steps of a stairway.  Be aware of any and all pets.  Review your medicines with your healthcare provider. Some medicines can cause dizziness or changes in blood pressure, which increase your risk of falling. Talk with  your health care provider about other ways that you can decrease your risk of falls. This may include working with a physical therapist or trainer to improve your strength, balance, and endurance. This information is not intended to replace advice given to you by your health care provider. Make sure you discuss any questions you have with your health care provider. Document Released: 11/07/2002 Document Revised: 04/15/2016 Document Reviewed: 12/22/2014 Elsevier Interactive Patient Education  2018 [employer]  

## 2017-11-26 NOTE — Progress Notes (Signed)
   Subjective:    Patient ID: [name], female    DOB: 06/23/1946, 71 y.o.   MRN: [id number]  Fall  The accident occurred 5 to 7 days ago. The fall occurred while standing. She landed on hard floor. There was no blood loss. The point of impact was the left knee (left rib and left knee). The pain is present in the left knee (left rib). The pain is at a severity of 10/10. The pain is moderate. Pertinent negatives include no hematuria, loss of consciousness, numbness, tingling or visual change. She has tried rest for the symptoms. The treatment provided mild relief.      Review of Systems  Genitourinary: Negative for hematuria.  Neurological: Negative for tingling, loss of consciousness and numbness.  All other systems reviewed and are negative.      Objective:   Physical Exam  Constitutional: She is oriented to person, place, and time. She appears well-developed and well-nourished. No distress.  Morbid obese   HENT:  Head: Normocephalic.  Eyes: Pupils are equal, round, and reactive to light.  Neck: Normal range of motion. Neck supple. No thyromegaly present.  Cardiovascular: Normal rate, regular rhythm, normal heart sounds and intact distal pulses.  No murmur heard. Pulmonary/Chest: Effort normal and breath sounds normal. No respiratory distress. She has no wheezes.  Musculoskeletal: She exhibits edema (2+ BLE) and tenderness (left lower rib and left knee with palpation and movement).  Neurological: She is alert and oriented to person, place, and time.  Skin: Skin is warm and dry. Ecchymosis (left knee) noted.  Psychiatric: She has a normal mood and affect. Her behavior is normal. Judgment and thought content normal.  Vitals reviewed.  Left knee- Negative, Preliminary reading by Evelina Dun, FNP [facility]   Chest- Pleural effusion noted Preliminary reading by Evelina Dun, FNP [facility]   BP (!) 190/95   Temp (!) 97.1 F (36.2 C) (Oral)   Ht 5\' 5"  (1.651 m)   BMI 47.09  kg/m      Assessment & Plan:  1. Fall, initial encounter - DG Knee 1-2 Views Left; Future - DG Ribs Unilateral W/Chest Left; Future  2. Contusion of rib on left side, initial encounter  3. Contusion of left knee, initial encounter  4. Pleural effusion Will continue to monitor, keep follow up with PCP  Rest Ice Fall preventions discussed Tylenol prn for pain RTO prn and keep follow up with PCP    Evelina Dun, FNP

## 2017-12-04 ENCOUNTER — Encounter: Payer: Self-pay | Admitting: Family Medicine

## 2017-12-04 ENCOUNTER — Ambulatory Visit (INDEPENDENT_AMBULATORY_CARE_PROVIDER_SITE_OTHER): Payer: Medicare Other | Admitting: Family Medicine

## 2017-12-04 VITALS — BP 139/64 | HR 95 | Temp 97.0°F | Ht 65.0 in | Wt 283.0 lb

## 2017-12-04 DIAGNOSIS — Z794 Long term (current) use of insulin: Secondary | ICD-10-CM | POA: Diagnosis not present

## 2017-12-04 DIAGNOSIS — I482 Chronic atrial fibrillation, unspecified: Secondary | ICD-10-CM

## 2017-12-04 DIAGNOSIS — I270 Primary pulmonary hypertension: Secondary | ICD-10-CM

## 2017-12-04 DIAGNOSIS — I5022 Chronic systolic (congestive) heart failure: Secondary | ICD-10-CM

## 2017-12-04 DIAGNOSIS — E119 Type 2 diabetes mellitus without complications: Secondary | ICD-10-CM

## 2017-12-04 DIAGNOSIS — Z7901 Long term (current) use of anticoagulants: Secondary | ICD-10-CM | POA: Diagnosis not present

## 2017-12-04 DIAGNOSIS — E1121 Type 2 diabetes mellitus with diabetic nephropathy: Secondary | ICD-10-CM | POA: Diagnosis not present

## 2017-12-04 LAB — COAGUCHEK XS/INR WAIVED
INR: 1.3 — ABNORMAL HIGH (ref 0.9–1.1)
Prothrombin Time: 15.2 s

## 2017-12-04 MED ORDER — WARFARIN SODIUM 1 MG PO TABS: 3.0000 mg | ORAL_TABLET | Freq: Every day | ORAL | 0 refills | Status: DC

## 2017-12-04 MED ORDER — METFORMIN HCL ER 750 MG PO TB24: 750.0000 mg | ORAL_TABLET | Freq: Every day | ORAL | 1 refills | Status: DC

## 2017-12-04 NOTE — Progress Notes (Signed)
Subjective:  Patient ID: [name], female    DOB: Sep 17, 1946  Age: 72 y.o. MRN: [id number]  CC: Follow-up (pt here today for routine follow up of her chronic medical conditions and PROTIME)   HPI [name] presents for daily diarrhea, incontinent of stool every time she takes meformin. Long term use of metformin was decreased. Glucose remained stable, but the incontinence has continued.  Also recheck protime/INR - dropping recently since peak several weeks ago at 6.1. No cause could be identified. Denies bleeding, chest pain. No TIA sx. Left knee is sore still, as are ribs. [place] on Dec 21. Didn't come in until last week - [name] note was reviewed.  Depression screen [facility] 2/9 12/04/2017 11/26/2017 11/06/2017  Decreased Interest 0 0 0  Down, Depressed, Hopeless 0 0 0  PHQ - 2 Score 0 0 0  Altered sleeping - - -  Tired, decreased energy - - -  Change in appetite - - -  Feeling bad or failure about yourself  - - -  Trouble concentrating - - -  Moving slowly or fidgety/restless - - -  Suicidal thoughts - - -  PHQ-9 Score - - -  Difficult doing work/chores - - -  Some recent data might be hidden    History [name] has a past medical history of Anemia, Arthritis, Asthma, Atrial fibrillation ([place]), CAD (coronary artery disease), Cataract, CHF (congestive heart failure) ([place]), COPD (chronic obstructive pulmonary disease) ([place]), Diabetes mellitus, Gait instability, GERD (gastroesophageal reflux disease), Hyperlipidemia, Hypertension, Obesity, Oxygen dependent, Sleep apnea, obstructive (2008), and Stroke ([place]).   She has a past surgical history that includes Cataract extraction w/ intraocular lens  implant, bilateral; Appendectomy; Cholecystectomy; Carpal tunnel release; Cervical spine surgery; Cesarean section; Umbilical hernia repair; Knee arthroscopy; Colonoscopy (11/22/2002); Upper gastrointestinal endoscopy (11/22/2002); Colonoscopy (11/26/2005); Femur IM nail (Left,  05/08/2013); Hip surgery (Left); and Eye surgery.   Her family history includes Alzheimer's disease in her father and mother; Asthma in her daughter and grandchild; Breast cancer in her sister; Cancer in her brother, mother, sister, and sister; Colon cancer in her mother; Diabetes in her daughter, mother, sister, and unknown relative; Heart attack in her mother, sister, and unknown relative; Heart disease in her brother, mother, and sister; Kidney disease in her mother; Migraines in her sister.She reports that  has never smoked. she has never used smokeless tobacco. She reports that she does not drink alcohol or use drugs.    ROS Review of Systems  Constitutional: Negative for activity change, appetite change and fever.  HENT: Negative for congestion, rhinorrhea and sore throat.   Eyes: Negative for visual disturbance.  Respiratory: Negative for cough and shortness of breath.   Cardiovascular: Positive for leg swelling (stable). Negative for chest pain and palpitations.  Gastrointestinal: Negative for abdominal pain, diarrhea and nausea.  Genitourinary: Negative for dysuria.  Musculoskeletal: Negative for arthralgias and myalgias.  Neurological: Positive for weakness (generalized).  Hematological: Does not bruise/bleed easily.  Psychiatric/Behavioral: Negative for decreased concentration.    Objective:  BP 139/64   Pulse 95   Temp (!) 97 F (36.1 C) (Oral)   Ht 5' 5"  (1.651 m)   Wt 283 lb (128.4 kg)   BMI 47.09 kg/m   BP Readings from Last 3 Encounters:  12/04/17 139/64  11/26/17 (!) 190/95  11/06/17 127/63    Wt Readings from Last 3 Encounters:  12/04/17 283 lb (128.4 kg)  11/03/17 283 lb (128.4 kg)  08/26/17 262 lb (118.8 kg)  Physical Exam  Constitutional: She is oriented to person, place, and time. She appears well-developed and well-nourished. No distress.  HENT:  Head: Normocephalic and atraumatic.  Right Ear: External ear normal.  Left Ear: External ear  normal.  Nose: Nose normal.  Mouth/Throat: Oropharynx is clear and moist.  Eyes: Conjunctivae and EOM are normal. Pupils are equal, round, and reactive to light.  Neck: Normal range of motion. Neck supple. No thyromegaly present.  Cardiovascular: Normal rate, regular rhythm and normal heart sounds.  No murmur heard. Pulmonary/Chest: Effort normal and breath sounds normal. No respiratory distress. She has no wheezes.  Abdominal: Soft. She exhibits no distension. There is no tenderness.  Lymphadenopathy:    She has no cervical adenopathy.  Neurological: She is alert and oriented to person, place, and time. She has normal reflexes.  Skin: Skin is warm and dry.  Psychiatric: She has a normal mood and affect. Her behavior is normal. Thought content normal.      Assessment & Plan:   [name] was seen today for follow-up.  Diagnoses and all orders for this visit:  Current use of long term anticoagulation -     CoaguChek XS/INR Waived  Chronic atrial fibrillation (HCC)  Long-term current use of insulin for diabetes mellitus (HCC)  PULMONARY HYPERTENSION  Chronic systolic congestive heart failure (HCC)  Diabetic nephropathy associated with type 2 diabetes mellitus ([place])  Other orders -     metFORMIN (GLUCOPHAGE-XR) 750 MG 24 hr tablet; Take 1 tablet (750 mg total) by mouth daily with breakfast. -     warfarin (COUMADIN) 1 MG tablet; Take 3 tablets (3 mg total) by mouth daily.       I have discontinued [name]'s metFORMIN. I am also having her start on metFORMIN and warfarin. Additionally, I am having her maintain her fish oil-omega-3 fatty acids, Lancets, JOBST RELIEF 30-40MMHG XL, fluocinonide-emollient, B-D ULTRAFINE III SHORT PEN, glucose blood, TRUE METRIX GO GLUCOSE METER, TRULICITY, acetaminophen, ferrous sulfate, vitamin B-12, Multiple Vitamins-Minerals (HAIR SKIN AND NAILS FORMULA PO), diphenhydrAMINE, albuterol, alendronate, levothyroxine, torsemide, econazole  nitrate, diltiazem, lisinopril, potassium chloride SA, LEVEMIR FLEXTOUCH, and warfarin.  Allergies as of 12/04/2017      Reactions   Lisinopril    R/t to kidney   Codeine Nausea And Vomiting      Medication List        Accurate as of 12/04/17  3:54 PM. Always use your most recent med list.          acetaminophen 500 MG tablet Commonly known as:  TYLENOL Take by mouth daily.   albuterol 108 (90 Base) MCG/ACT inhaler Commonly known as:  PROVENTIL HFA;VENTOLIN HFA Inhale 2 puffs into the lungs every 6 (six) hours as needed for wheezing or shortness of breath.   alendronate 70 MG tablet Commonly known as:  FOSAMAX Take 1 tablet (70 mg total) by mouth every 7 (seven) days.   B-D ULTRAFINE III SHORT PEN 31G X 8 MM Misc Generic drug:  Insulin Pen Needle   diltiazem 180 MG 24 hr capsule Commonly known as:  CARDIZEM CD TAKE 1 CAPSULE (180 MG TOTAL) BY MOUTH DAILY.   diphenhydrAMINE 25 MG tablet Commonly known as:  SOMINEX Take 50 mg by mouth at bedtime as needed for sleep.   econazole nitrate 1 % cream Apply topically daily.   ferrous sulfate 325 (65 FE) MG tablet Take 325 mg by mouth daily with breakfast.   fish oil-omega-3 fatty acids 1000 MG capsule Take 2 g  by mouth daily.   fluocinonide-emollient 0.05 % cream Commonly known as:  LIDEX-E Apply to feet after soaking each night   glucose blood test strip test sugars twice daily   HAIR SKIN AND NAILS FORMULA PO Take 1 tablet by mouth daily.   JOBST RELIEF 30-40MMHG XL Misc Wear daily for swelling in legs   Lancets Misc   LEVEMIR FLEXTOUCH 100 UNIT/ML Pen Generic drug:  Insulin Detemir INJECT 70 UNITS INTO THE SKIN DAILY WITH BREAKFAST.   levothyroxine 50 MCG tablet Commonly known as:  SYNTHROID, LEVOTHROID Take 1 tablet (50 mcg total) by mouth daily.   lisinopril 5 MG tablet Commonly known as:  PRINIVIL,ZESTRIL Take 1 tablet (5 mg total) by mouth daily.   metFORMIN 750 MG 24 hr tablet Commonly known  as:  GLUCOPHAGE-XR Take 1 tablet (750 mg total) by mouth daily with breakfast.   potassium chloride SA 20 MEQ tablet Commonly known as:  KLOR-CON M20 Take 1 tablet (20 mEq total) by mouth daily.   torsemide 20 MG tablet Commonly known as:  DEMADEX Take 2 tablets every morning and 1 tablet at noon.   TRUE METRIX GO GLUCOSE METER w/Device Kit 1 strip by Does not apply route 2 (two) times daily.   TRULICITY 3.40 GE/4.0TV Sopn Generic drug:  Dulaglutide INJECT 0.75 MG INTO THE SKIN ONCE A WEEK.   vitamin B-12 50 MCG tablet Commonly known as:  CYANOCOBALAMIN Take 50 mcg by mouth daily.   warfarin 5 MG tablet Commonly known as:  COUMADIN Take as directed by the anticoagulation clinic. If you are unsure how to take this medication, talk to your nurse or doctor. Original instructions:  Take 0.5 tablets (2.5 mg total) by mouth daily at 6 PM for 3 days. TAKE 1 TABLET BY MOUTH EVERY EVENING EXCEPT MONDAY. NO DOSE MONDAY   warfarin 1 MG tablet Commonly known as:  COUMADIN Take as directed by the anticoagulation clinic. If you are unsure how to take this medication, talk to your nurse or doctor. Original instructions:  Take 3 tablets (3 mg total) by mouth daily.        Follow-up: Return in about 1 week (around 12/11/2017).  Claretta Fraise, M.D.

## 2017-12-07 ENCOUNTER — Ambulatory Visit: Payer: Medicare HMO | Admitting: Family Medicine

## 2017-12-09 ENCOUNTER — Other Ambulatory Visit: Payer: Self-pay | Admitting: Family Medicine

## 2017-12-14 ENCOUNTER — Ambulatory Visit: Payer: Medicare HMO | Admitting: Family Medicine

## 2018-01-06 ENCOUNTER — Encounter: Payer: Self-pay | Admitting: Family Medicine

## 2018-01-06 ENCOUNTER — Ambulatory Visit (INDEPENDENT_AMBULATORY_CARE_PROVIDER_SITE_OTHER): Payer: Medicare HMO | Admitting: Family Medicine

## 2018-01-06 VITALS — BP 125/74 | HR 77 | Temp 96.9°F | Ht 65.0 in | Wt 283.2 lb

## 2018-01-06 DIAGNOSIS — Z7901 Long term (current) use of anticoagulants: Secondary | ICD-10-CM | POA: Diagnosis not present

## 2018-01-06 DIAGNOSIS — I482 Chronic atrial fibrillation, unspecified: Secondary | ICD-10-CM

## 2018-01-06 DIAGNOSIS — M79673 Pain in unspecified foot: Secondary | ICD-10-CM

## 2018-01-06 LAB — COAGUCHEK XS/INR WAIVED
INR: 1.3 — ABNORMAL HIGH (ref 0.9–1.1)
Prothrombin Time: 15.7 s

## 2018-01-06 LAB — POCT INR: INR: 1.3

## 2018-01-06 MED ORDER — WARFARIN SODIUM 4 MG PO TABS: 4.0000 mg | ORAL_TABLET | Freq: Every day | ORAL | 0 refills | Status: DC

## 2018-01-06 NOTE — Progress Notes (Signed)
Subjective:  Patient ID: [name], female    DOB: Sep 04, 1946  Age: 72 y.o. MRN: [id number]  CC: Coagulation Disorder (pt here today to check protime)   HPI [name] presents for Patient in for follow-up of atrial fibrillation. Patient denies any recent bouts of chest pain or palpitations. Additionally, patient is taking anticoagulants. Patient denies any recent excessive bleeding episodes including epistaxis, bleeding from the gums, genitalia, rectal bleeding or hematuria. Additionally there has been no excessive bruising.  Depression screen [facility] 2/9 01/06/2018 12/04/2017 11/26/2017  Decreased Interest 0 0 0  Down, Depressed, Hopeless 0 0 0  PHQ - 2 Score 0 0 0  Altered sleeping - - -  Tired, decreased energy - - -  Change in appetite - - -  Feeling bad or failure about yourself  - - -  Trouble concentrating - - -  Moving slowly or fidgety/restless - - -  Suicidal thoughts - - -  PHQ-9 Score - - -  Difficult doing work/chores - - -  Some recent data might be hidden    History [name] has a past medical history of Anemia, Arthritis, Asthma, Atrial fibrillation ([place]), CAD (coronary artery disease), Cataract, CHF (congestive heart failure) ([place]), COPD (chronic obstructive pulmonary disease) ([place]), Diabetes mellitus, Gait instability, GERD (gastroesophageal reflux disease), Hyperlipidemia, Hypertension, Obesity, Oxygen dependent, Sleep apnea, obstructive (2008), and Stroke ([place]).   She has a past surgical history that includes Cataract extraction w/ intraocular lens  implant, bilateral; Appendectomy; Cholecystectomy; Carpal tunnel release; Cervical spine surgery; Cesarean section; Umbilical hernia repair; Knee arthroscopy; Colonoscopy (11/22/2002); Upper gastrointestinal endoscopy (11/22/2002); Colonoscopy (11/26/2005); Femur IM nail (Left, 05/08/2013); Hip surgery (Left); and Eye surgery.   Her family history includes Alzheimer's disease in her father and mother; Asthma in her  daughter and grandchild; Breast cancer in her sister; Cancer in her brother, mother, sister, and sister; Colon cancer in her mother; Diabetes in her daughter, mother, sister, and unknown relative; Heart attack in her mother, sister, and unknown relative; Heart disease in her brother, mother, and sister; Kidney disease in her mother; Migraines in her sister.She reports that  has never smoked. she has never used smokeless tobacco. She reports that she does not drink alcohol or use drugs.    ROS Review of Systems  Constitutional: Negative for activity change, appetite change and fever.  HENT: Negative for congestion, rhinorrhea and sore throat.   Eyes: Negative for visual disturbance.  Respiratory: Negative for cough and shortness of breath.   Cardiovascular: Negative for chest pain and palpitations.  Gastrointestinal: Negative for abdominal pain, diarrhea and nausea.  Genitourinary: Negative for dysuria.  Musculoskeletal: Negative for arthralgias and myalgias.    Objective:  BP 125/74   Pulse 77   Temp (!) 96.9 F (36.1 C) (Oral)   Ht _0  (1.651 m)   Wt 283 lb 4 oz (128.5 kg)   BMI 47.14 kg/m   BP Readings from Last 3 Encounters:  01/06/18 125/74  12/04/17 139/64  11/26/17 (!) 190/95    Wt Readings from Last 3 Encounters:  01/06/18 283 lb 4 oz (128.5 kg)  12/04/17 283 lb (128.4 kg)  11/03/17 283 lb (128.4 kg)     Physical Exam  Constitutional: She is oriented to person, place, and time. She appears well-developed and well-nourished. No distress.  HENT:  Head: Normocephalic and atraumatic.  Right Ear: External ear normal.  Left Ear: External ear normal.  Nose: Nose normal.  Mouth/Throat: Oropharynx is clear and moist.  Eyes: Conjunctivae and EOM are normal. Pupils are equal, round, and reactive to light.  Neck: Normal range of motion. Neck supple. No thyromegaly present.  Cardiovascular: Normal rate, regular rhythm and normal heart sounds.  No murmur  heard. Pulmonary/Chest: Effort normal and breath sounds normal. No respiratory distress. She has no wheezes. She has no rales.  Abdominal: Soft. Bowel sounds are normal. She exhibits no distension. There is no tenderness.  Lymphadenopathy:    She has no cervical adenopathy.  Neurological: She is alert and oriented to person, place, and time. She has normal reflexes.  Skin: Skin is warm and dry.  Psychiatric: She has a normal mood and affect. Her behavior is normal. Judgment and thought content normal.   INR today is subtherapeutic at 1.3.   Assessment & Plan:   [name] was seen today for coagulation disorder.  Diagnoses and all orders for this visit:  Current use of long term anticoagulation -     CoaguChek XS/INR Waived  Chronic atrial fibrillation (HCC)  Pain of foot, unspecified laterality -     Ambulatory referral to Podiatry  Other orders -     warfarin (COUMADIN) 4 MG tablet; Take 1 tablet (4 mg total) by mouth daily. -     POCT INR     Results for orders placed or performed in visit on 01/06/18  CoaguChek XS/INR Waived  Result Value Ref Range   INR 1.3 (H) 0.9 - 1.1   Prothrombin Time 15.7 sec  POCT INR  Result Value Ref Range   INR 1.3      I am having [name] start on warfarin. I am also having her maintain her fish oil-omega-3 fatty acids, Lancets, JOBST RELIEF 30-40MMHG XL, fluocinonide-emollient, B-D ULTRAFINE III SHORT PEN, glucose blood, TRUE METRIX GO GLUCOSE METER, TRULICITY, acetaminophen, ferrous sulfate, vitamin B-12, Multiple Vitamins-Minerals (HAIR SKIN AND NAILS FORMULA PO), diphenhydrAMINE, albuterol, alendronate, levothyroxine, torsemide, econazole nitrate, diltiazem, lisinopril, potassium chloride SA, LEVEMIR FLEXTOUCH, warfarin, metFORMIN, warfarin, and levothyroxine.  Allergies as of 01/06/2018      Reactions   Lisinopril    R/t to kidney   Codeine Nausea And Vomiting      Medication List        Accurate as of 01/06/18  3:37 PM.  Always use your most recent med list.          acetaminophen 500 MG tablet Commonly known as:  TYLENOL Take by mouth daily.   albuterol 108 (90 Base) MCG/ACT inhaler Commonly known as:  PROVENTIL HFA;VENTOLIN HFA Inhale 2 puffs into the lungs every 6 (six) hours as needed for wheezing or shortness of breath.   alendronate 70 MG tablet Commonly known as:  FOSAMAX Take 1 tablet (70 mg total) by mouth every 7 (seven) days.   B-D ULTRAFINE III SHORT PEN 31G X 8 MM Misc Generic drug:  Insulin Pen Needle   diltiazem 180 MG 24 hr capsule Commonly known as:  CARDIZEM CD TAKE 1 CAPSULE (180 MG TOTAL) BY MOUTH DAILY.   diphenhydrAMINE 25 MG tablet Commonly known as:  SOMINEX Take 50 mg by mouth at bedtime as needed for sleep.   econazole nitrate 1 % cream Apply topically daily.   ferrous sulfate 325 (65 FE) MG tablet Take 325 mg by mouth daily with breakfast.   fish oil-omega-3 fatty acids 1000 MG capsule Take 2 g by mouth daily.   fluocinonide-emollient 0.05 % cream Commonly known as:  LIDEX-E Apply to feet after soaking each night  glucose blood test strip test sugars twice daily   HAIR SKIN AND NAILS FORMULA PO Take 1 tablet by mouth daily.   JOBST RELIEF 30-40MMHG XL Misc Wear daily for swelling in legs   Lancets Misc   LEVEMIR FLEXTOUCH 100 UNIT/ML Pen Generic drug:  Insulin Detemir INJECT 70 UNITS INTO THE SKIN DAILY WITH BREAKFAST.   levothyroxine 50 MCG tablet Commonly known as:  SYNTHROID, LEVOTHROID Take 1 tablet (50 mcg total) by mouth daily.   levothyroxine 50 MCG tablet Commonly known as:  SYNTHROID, LEVOTHROID TAKE 1 TABLET (50 MCG TOTAL) BY MOUTH DAILY.   lisinopril 5 MG tablet Commonly known as:  PRINIVIL,ZESTRIL Take 1 tablet (5 mg total) by mouth daily.   metFORMIN 750 MG 24 hr tablet Commonly known as:  GLUCOPHAGE-XR Take 1 tablet (750 mg total) by mouth daily with breakfast.   potassium chloride SA 20 MEQ tablet Commonly known as:   KLOR-CON M20 Take 1 tablet (20 mEq total) by mouth daily.   torsemide 20 MG tablet Commonly known as:  DEMADEX Take 2 tablets every morning and 1 tablet at noon.   TRUE METRIX GO GLUCOSE METER w/Device Kit 1 strip by Does not apply route 2 (two) times daily.   TRULICITY 7.01 XB/9.3JQ Sopn Generic drug:  Dulaglutide INJECT 0.75 MG INTO THE SKIN ONCE A WEEK.   vitamin B-12 50 MCG tablet Commonly known as:  CYANOCOBALAMIN Take 50 mcg by mouth daily.   warfarin 5 MG tablet Commonly known as:  COUMADIN Take as directed by the anticoagulation clinic. If you are unsure how to take this medication, talk to your nurse or doctor. Original instructions:  Take 0.5 tablets (2.5 mg total) by mouth daily at 6 PM for 3 days. TAKE 1 TABLET BY MOUTH EVERY EVENING EXCEPT MONDAY. NO DOSE MONDAY   warfarin 1 MG tablet Commonly known as:  COUMADIN Take as directed by the anticoagulation clinic. If you are unsure how to take this medication, talk to your nurse or doctor. Original instructions:  Take 3 tablets (3 mg total) by mouth daily.   warfarin 4 MG tablet Commonly known as:  COUMADIN Take as directed by the anticoagulation clinic. If you are unsure how to take this medication, talk to your nurse or doctor. Original instructions:  Take 1 tablet (4 mg total) by mouth daily.      Anticoagulation Summary  As of 01/06/2018   INR goal:   2.0-3.0  TTR:   18.4 % (1 y)  INR used for dosing:     Warfarin maintenance plan:   4 mg (4 mg x 1) every day  Weekly warfarin total:   28 mg  Plan last modified:   Claretta Fraise, MD (01/06/2018)  Next INR check:   01/20/2018  Target end date:      Indications   Atrial fibrillation ([place]) [I48.91]        Anticoagulation Episode Summary    INR check location:   Clinic Lab   Preferred lab:      Send INR reminders to:   [facility]   Comments:       Anticoagulation Care Providers    Provider Role Specialty Phone number   Claretta Fraise, MD Referring  Family Medicine [phone]       Follow-up: No Follow-up on file.  Claretta Fraise, M.D.

## 2018-01-11 ENCOUNTER — Other Ambulatory Visit: Payer: Self-pay | Admitting: Family Medicine

## 2018-01-20 ENCOUNTER — Ambulatory Visit: Payer: Medicare HMO | Admitting: Family Medicine

## 2018-01-21 ENCOUNTER — Other Ambulatory Visit: Payer: Self-pay | Admitting: Family Medicine

## 2018-01-21 ENCOUNTER — Encounter: Payer: Self-pay | Admitting: Family Medicine

## 2018-01-21 ENCOUNTER — Ambulatory Visit (INDEPENDENT_AMBULATORY_CARE_PROVIDER_SITE_OTHER): Payer: Medicare HMO

## 2018-01-21 ENCOUNTER — Ambulatory Visit (INDEPENDENT_AMBULATORY_CARE_PROVIDER_SITE_OTHER): Payer: Medicare HMO | Admitting: Family Medicine

## 2018-01-21 VITALS — BP 126/61 | HR 75 | Temp 96.9°F | Ht 65.0 in | Wt 286.0 lb

## 2018-01-21 DIAGNOSIS — Z780 Asymptomatic menopausal state: Secondary | ICD-10-CM

## 2018-01-21 DIAGNOSIS — Z7901 Long term (current) use of anticoagulants: Secondary | ICD-10-CM | POA: Diagnosis not present

## 2018-01-21 DIAGNOSIS — I5022 Chronic systolic (congestive) heart failure: Secondary | ICD-10-CM

## 2018-01-21 DIAGNOSIS — I482 Chronic atrial fibrillation, unspecified: Secondary | ICD-10-CM

## 2018-01-21 LAB — POCT INR: INR: 2.3

## 2018-01-21 LAB — COAGUCHEK XS/INR WAIVED
INR: 2.3 — ABNORMAL HIGH (ref 0.9–1.1)
Prothrombin Time: 27.7 s

## 2018-01-21 MED ORDER — TORSEMIDE 100 MG PO TABS: 100.0000 mg | ORAL_TABLET | Freq: Every day | ORAL | 1 refills | Status: DC

## 2018-01-21 NOTE — Progress Notes (Signed)
Subjective:  Patient ID: [name], female    DOB: 05-10-1946  Age: 72 y.o. MRN: [id number]  CC: Hospitalization Follow-up (pt here today following up after going to ED for trouble breathing and heart racing and was told she had CHF and also that she should see a Pulmonolgist)   HPI [name] presents for  Recheck of COPD. Some edema of ankles noted. Breathing better. Using O2 at home.    Patient in for follow-up of atrial fibrillation. Patient denies any recent bouts of chest pain or palpitations. Additionally, patient is taking anticoagulants. Patient denies any recent excessive bleeding episodes including epistaxis, bleeding from the gums, genitalia, rectal bleeding or hematuria. Additionally there has been no excessive bruising.    Depression screen [facility] 2/9 01/21/2018 01/06/2018 12/04/2017  Decreased Interest 0 0 0  Down, Depressed, Hopeless 0 0 0  PHQ - 2 Score 0 0 0  Altered sleeping - - -  Tired, decreased energy - - -  Change in appetite - - -  Feeling bad or failure about yourself  - - -  Trouble concentrating - - -  Moving slowly or fidgety/restless - - -  Suicidal thoughts - - -  PHQ-9 Score - - -  Difficult doing work/chores - - -  Some recent data might be hidden    History [name] has a past medical history of Anemia, Arthritis, Asthma, Atrial fibrillation ([place]), CAD (coronary artery disease), Cataract, CHF (congestive heart failure) ([place]), COPD (chronic obstructive pulmonary disease) ([place]), Diabetes mellitus, Gait instability, GERD (gastroesophageal reflux disease), Hyperlipidemia, Hypertension, Obesity, Oxygen dependent, Sleep apnea, obstructive (2008), and Stroke ([place]).   She has a past surgical history that includes Cataract extraction w/ intraocular lens  implant, bilateral; Appendectomy; Cholecystectomy; Carpal tunnel release; Cervical spine surgery; Cesarean section; Umbilical hernia repair; Knee arthroscopy; Colonoscopy (11/22/2002); Upper  gastrointestinal endoscopy (11/22/2002); Colonoscopy (11/26/2005); Femur IM nail (Left, 05/08/2013); Hip surgery (Left); and Eye surgery.   Her family history includes Alzheimer's disease in her father and mother; Asthma in her daughter and grandchild; Breast cancer in her sister; Cancer in her brother, mother, sister, and sister; Colon cancer in her mother; Diabetes in her daughter, mother, sister, and unknown relative; Heart attack in her mother, sister, and unknown relative; Heart disease in her brother, mother, and sister; Kidney disease in her mother; Migraines in her sister.She reports that  has never smoked. she has never used smokeless tobacco. She reports that she does not drink alcohol or use drugs.    ROS Review of Systems  Constitutional: Negative for activity change, appetite change and fever.  HENT: Negative for congestion, rhinorrhea and sore throat.   Eyes: Negative for visual disturbance.  Respiratory: Positive for shortness of breath. Negative for cough.   Cardiovascular: Negative for chest pain and palpitations.  Gastrointestinal: Negative for abdominal pain, diarrhea and nausea.  Genitourinary: Negative for dysuria.  Musculoskeletal: Positive for arthralgias. Negative for myalgias.  All other systems reviewed and are negative.   Objective:  BP 126/61   Pulse 75   Temp (!) 96.9 F (36.1 C) (Oral)   Ht _0  (1.651 m)   Wt 286 lb (129.7 kg)   SpO2 98%   BMI 47.59 kg/m   BP Readings from Last 3 Encounters:  01/21/18 126/61  01/06/18 125/74  12/04/17 139/64    Wt Readings from Last 3 Encounters:  01/21/18 286 lb (129.7 kg)  01/06/18 283 lb 4 oz (128.5 kg)  12/04/17 283 lb (128.4 kg)  Physical Exam  Constitutional: She is oriented to person, place, and time. She appears well-developed and well-nourished. No distress.  HENT:  Head: Normocephalic and atraumatic.  Eyes: Conjunctivae are normal. Pupils are equal, round, and reactive to light.  Neck: Normal  range of motion. Neck supple. No thyromegaly present.  Cardiovascular: Normal rate, regular rhythm and normal heart sounds.  No murmur heard. Pulmonary/Chest: Effort normal and breath sounds normal. No respiratory distress. She has no wheezes. She has no rales.  Abdominal: Soft. Bowel sounds are normal. She exhibits no distension. There is no tenderness.  Musculoskeletal: Normal range of motion. She exhibits edema (1-2+ at ankles).  Lymphadenopathy:    She has no cervical adenopathy.  Neurological: She is alert and oriented to person, place, and time.  Skin: Skin is warm and dry.  Psychiatric: She has a normal mood and affect. Her behavior is normal. Judgment and thought content normal.      Assessment & Plan:   [name] was seen today for hospitalization follow-up.  Diagnoses and all orders for this visit:  Current use of long term anticoagulation -     CoaguChek XS/INR Waived  Chronic systolic congestive heart failure (HCC) -     Brain natriuretic peptide -     CMP14+EGFR -     DG Chest 2 View; Future  Chronic atrial fibrillation (HCC)  Severe obesity (BMI >= 40) (HCC)  Other orders -     torsemide (DEMADEX) 100 MG tablet; Take 1 tablet (100 mg total) by mouth daily. Take 2 tablets every morning and 1 tablet at noon. -     POCT INR       I have changed Ishika L. Szeliga's torsemide. I am also having her maintain her fish oil-omega-3 fatty acids, Lancets, JOBST RELIEF 30-40MMHG XL, fluocinonide-emollient, B-D ULTRAFINE III SHORT PEN, glucose blood, TRUE METRIX GO GLUCOSE METER, TRULICITY, acetaminophen, ferrous sulfate, vitamin B-12, Multiple Vitamins-Minerals (HAIR SKIN AND NAILS FORMULA PO), diphenhydrAMINE, albuterol, alendronate, levothyroxine, econazole nitrate, diltiazem, lisinopril, potassium chloride SA, LEVEMIR FLEXTOUCH, metFORMIN, warfarin, and ipratropium-albuterol.  Allergies as of 01/21/2018      Reactions   Lisinopril    R/t to kidney   Codeine Nausea And  Vomiting      Medication List        Accurate as of 01/21/18 11:59 PM. Always use your most recent med list.          acetaminophen 500 MG tablet Commonly known as:  TYLENOL Take by mouth daily.   albuterol 108 (90 Base) MCG/ACT inhaler Commonly known as:  PROVENTIL HFA;VENTOLIN HFA Inhale 2 puffs into the lungs every 6 (six) hours as needed for wheezing or shortness of breath.   alendronate 70 MG tablet Commonly known as:  FOSAMAX Take 1 tablet (70 mg total) by mouth every 7 (seven) days.   B-D ULTRAFINE III SHORT PEN 31G X 8 MM Misc Generic drug:  Insulin Pen Needle   diltiazem 180 MG 24 hr capsule Commonly known as:  CARDIZEM CD TAKE 1 CAPSULE (180 MG TOTAL) BY MOUTH DAILY.   diphenhydrAMINE 25 MG tablet Commonly known as:  SOMINEX Take 50 mg by mouth at bedtime as needed for sleep.   econazole nitrate 1 % cream Apply topically daily.   ferrous sulfate 325 (65 FE) MG tablet Take 325 mg by mouth daily with breakfast.   fish oil-omega-3 fatty acids 1000 MG capsule Take 2 g by mouth daily.   fluocinonide-emollient 0.05 % cream Commonly known as:  LIDEX-E  Apply to feet after soaking each night   glucose blood test strip test sugars twice daily   HAIR SKIN AND NAILS FORMULA PO Take 1 tablet by mouth daily.   ipratropium-albuterol 0.5-2.5 (3) MG/3ML Soln Commonly known as:  DUONEB INHALE 1 VIAL (3ML) EVERY 4 HOURS BY NEBULIZATION   JOBST RELIEF 30-40MMHG XL Misc Wear daily for swelling in legs   Lancets Misc   LEVEMIR FLEXTOUCH 100 UNIT/ML Pen Generic drug:  Insulin Detemir INJECT 70 UNITS INTO THE SKIN DAILY WITH BREAKFAST.   levothyroxine 50 MCG tablet Commonly known as:  SYNTHROID, LEVOTHROID Take 1 tablet (50 mcg total) by mouth daily.   lisinopril 5 MG tablet Commonly known as:  PRINIVIL,ZESTRIL Take 1 tablet (5 mg total) by mouth daily.   metFORMIN 750 MG 24 hr tablet Commonly known as:  GLUCOPHAGE-XR Take 1 tablet (750 mg total) by mouth  daily with breakfast.   potassium chloride SA 20 MEQ tablet Commonly known as:  KLOR-CON M20 Take 1 tablet (20 mEq total) by mouth daily.   torsemide 100 MG tablet Commonly known as:  DEMADEX Take 1 tablet (100 mg total) by mouth daily. Take 2 tablets every morning and 1 tablet at noon.   TRUE METRIX GO GLUCOSE METER w/Device Kit 1 strip by Does not apply route 2 (two) times daily.   TRULICITY 1.10 YT/1.1NB Sopn Generic drug:  Dulaglutide INJECT 0.75 MG INTO THE SKIN ONCE A WEEK.   vitamin B-12 50 MCG tablet Commonly known as:  CYANOCOBALAMIN Take 50 mcg by mouth daily.   warfarin 4 MG tablet Commonly known as:  COUMADIN Take as directed by the anticoagulation clinic. If you are unsure how to take this medication, talk to your nurse or doctor. Original instructions:  Take 1 tablet (4 mg total) by mouth daily.        Follow-up: Return in about 1 month (around 02/18/2018).  Claretta Fraise, M.D.

## 2018-01-22 LAB — CMP14+EGFR
A/G RATIO: 1.4 (ref 1.2–2.2)
ALT: 8 IU/L (ref 0–32)
AST: 13 IU/L (ref 0–40)
Albumin: 4 g/dL (ref 3.5–4.8)
Alkaline Phosphatase: 181 IU/L — ABNORMAL HIGH (ref 39–117)
BUN/Creatinine Ratio: 17 (ref 12–28)
BUN: 28 mg/dL — AB (ref 8–27)
Bilirubin Total: 0.6 mg/dL (ref 0.0–1.2)
CALCIUM: 8.9 mg/dL (ref 8.7–10.3)
CO2: 20 mmol/L (ref 20–29)
Chloride: 106 mmol/L (ref 96–106)
Creatinine, Ser: 1.61 mg/dL — ABNORMAL HIGH (ref 0.57–1.00)
GFR calc Af Amer: 36 mL/min/{1.73_m2} — ABNORMAL LOW (ref >59)
GFR, EST NON AFRICAN AMERICAN: 32 mL/min/{1.73_m2} — AB (ref >59)
GLUCOSE: 164 mg/dL — AB (ref 65–99)
Globulin, Total: 2.8 g/dL (ref 1.5–4.5)
POTASSIUM: 5.2 mmol/L (ref 3.5–5.2)
Sodium: 142 mmol/L (ref 134–144)
TOTAL PROTEIN: 6.8 g/dL (ref 6.0–8.5)

## 2018-01-22 LAB — BRAIN NATRIURETIC PEPTIDE: BNP: 165.6 pg/mL — ABNORMAL HIGH (ref 0.0–100.0)

## 2018-01-24 ENCOUNTER — Other Ambulatory Visit: Payer: Self-pay | Admitting: Family Medicine

## 2018-01-24 ENCOUNTER — Encounter: Payer: Self-pay | Admitting: Family Medicine

## 2018-01-29 ENCOUNTER — Telehealth: Payer: Self-pay

## 2018-01-29 NOTE — Telephone Encounter (Signed)
Butch Penny from [employer] is a Tourist information centre manager that follows up with patient. She states that after speaking to the patient today that the patient is not sleeping well and tired all the time. Patient states that she had a CPAP at one time and used it for about 5 years. She states that the company that provided the CPAP came one day and picked it up and she is not sure why. Case manager thinks she may benefit from another CPAP and the patient is interested in getting another one as well. She has a follow up appt on March 8. Can a sleep study be ordered then to get things going? Please review and discuss with patient at upcoming appt if you agree.

## 2018-02-01 ENCOUNTER — Other Ambulatory Visit: Payer: Self-pay | Admitting: Family Medicine

## 2018-02-02 ENCOUNTER — Other Ambulatory Visit: Payer: Self-pay | Admitting: Family Medicine

## 2018-02-02 DIAGNOSIS — I482 Chronic atrial fibrillation, unspecified: Secondary | ICD-10-CM

## 2018-02-05 ENCOUNTER — Ambulatory Visit: Payer: Medicare HMO | Admitting: Family Medicine

## 2018-02-10 ENCOUNTER — Encounter: Payer: Self-pay | Admitting: Family Medicine

## 2018-02-10 ENCOUNTER — Ambulatory Visit (INDEPENDENT_AMBULATORY_CARE_PROVIDER_SITE_OTHER): Payer: Medicare HMO | Admitting: Family Medicine

## 2018-02-10 VITALS — BP 154/81 | HR 81 | Temp 96.7°F | Ht 65.0 in | Wt 286.5 lb

## 2018-02-10 DIAGNOSIS — I482 Chronic atrial fibrillation, unspecified: Secondary | ICD-10-CM

## 2018-02-10 DIAGNOSIS — Z7901 Long term (current) use of anticoagulants: Secondary | ICD-10-CM

## 2018-02-10 LAB — COAGUCHEK XS/INR WAIVED
INR: 1.7 — ABNORMAL HIGH (ref 0.9–1.1)
Prothrombin Time: 20.2 s

## 2018-02-10 MED ORDER — WARFARIN SODIUM 5 MG PO TABS: 5.0000 mg | ORAL_TABLET | Freq: Every day | ORAL | 3 refills | Status: DC

## 2018-02-10 MED ORDER — BD PEN NEEDLE SHORT U/F 31G X 8 MM MISC: 3 refills | Status: DC

## 2018-02-10 NOTE — Progress Notes (Signed)
   HPI  Patient presents today for Patient in for follow-up of atrial fibrillation. Patient denies any recent bouts of chest pain or palpitations. Additionally, patient is taking anticoagulants. Patient denies any recent excessive bleeding episodes including epistaxis, bleeding from the gums, genitalia, rectal bleeding or hematuria. Additionally there has been no excessive bruising.  PMH: Smoking status noted ROS: Per HPI  Objective: BP (!) 154/81   Pulse 81   Temp (!) 96.7 F (35.9 C) (Oral)   Ht 5\' 5"  (1.651 m)   Wt 286 lb 8 oz (130 kg)   BMI 47.68 kg/m  Gen: NAD, alert, cooperative with exam HEENT: NCAT, EOMI, PERRL CV: RRR, good S1/S2, no murmur Resp: CTABL, no wheezes, non-labored Abd: SNTND, BS present, no guarding or organomegaly Ext: No edema, warm Neuro: Alert and oriented, No gross deficits Results for orders placed or performed in visit on 02/10/18  CoaguChek XS/INR Waived  Result Value Ref Range   INR 1.7 (H) 0.9 - 1.1   Prothrombin Time 20.2 sec    Assessment and plan:  1. Current use of long term anticoagulation   2. Chronic atrial fibrillation (HCC)     Meds ordered this encounter  Medications  . B-D ULTRAFINE III SHORT PEN 31G X 8 MM MISC    Sig: Use to inject insulin twice daily as directed    Dispense:  200 each    Refill:  3  . warfarin (COUMADIN) 5 MG tablet    Sig: Take 1 tablet (5 mg total) by mouth daily.    Dispense:  30 tablet    Refill:  3    Orders Placed This Encounter  Procedures  . CoaguChek XS/INR Waived       Claretta Fraise, MD

## 2018-02-22 ENCOUNTER — Encounter: Payer: Self-pay | Admitting: Family Medicine

## 2018-02-23 ENCOUNTER — Encounter: Payer: Self-pay | Admitting: Family Medicine

## 2018-02-23 ENCOUNTER — Ambulatory Visit (INDEPENDENT_AMBULATORY_CARE_PROVIDER_SITE_OTHER): Payer: Medicare HMO | Admitting: Family Medicine

## 2018-02-23 VITALS — BP 136/80 | HR 81 | Temp 97.0°F | Ht 65.0 in | Wt 283.2 lb

## 2018-02-23 DIAGNOSIS — Z7901 Long term (current) use of anticoagulants: Secondary | ICD-10-CM | POA: Diagnosis not present

## 2018-02-23 DIAGNOSIS — E119 Type 2 diabetes mellitus without complications: Secondary | ICD-10-CM

## 2018-02-23 DIAGNOSIS — Z794 Long term (current) use of insulin: Secondary | ICD-10-CM | POA: Diagnosis not present

## 2018-02-23 DIAGNOSIS — I482 Chronic atrial fibrillation, unspecified: Secondary | ICD-10-CM

## 2018-02-23 DIAGNOSIS — E785 Hyperlipidemia, unspecified: Secondary | ICD-10-CM | POA: Insufficient documentation

## 2018-02-23 DIAGNOSIS — E1121 Type 2 diabetes mellitus with diabetic nephropathy: Secondary | ICD-10-CM

## 2018-02-23 DIAGNOSIS — E782 Mixed hyperlipidemia: Secondary | ICD-10-CM

## 2018-02-23 LAB — POCT INR: INR: 1.2

## 2018-02-23 LAB — COAGUCHEK XS/INR WAIVED
INR: 1.2 — ABNORMAL HIGH (ref 0.9–1.1)
Prothrombin Time: 14.3 s

## 2018-02-23 LAB — BAYER DCA HB A1C WAIVED: HB A1C (BAYER DCA - WAIVED): 6.9 % (ref <7.0)

## 2018-02-23 MED ORDER — WARFARIN SODIUM 5 MG PO TABS: 5.0000 mg | ORAL_TABLET | Freq: Every day | ORAL | 0 refills | Status: DC

## 2018-02-23 MED ORDER — WARFARIN SODIUM 1 MG PO TABS: 2.0000 mg | ORAL_TABLET | Freq: Every day | ORAL | 0 refills | Status: DC

## 2018-02-23 NOTE — Progress Notes (Signed)
Subjective:  Patient ID: [name], female    DOB: 17-Mar-1946  Age: 72 y.o. MRN: [id number]  CC: Follow-up (pt here today for [place])   HPI [name] presents for Patient in for follow-up of atrial fibrillation. Patient denies any recent bouts of chest pain or palpitations. Additionally, patient is taking anticoagulants. Patient denies any recent excessive bleeding episodes including epistaxis, bleeding from the gums, genitalia, rectal bleeding or hematuria. Additionally there has been no excessive bruising.  PT/INR noted to be quite low.  Patient denies any change in her diet.  She is eating a significant amount of greens but no more or less than usual. Follow-up of diabetes. Patient does not check blood sugar at home very often.  Reading yesterday morning was 131 Patient denies symptoms such as polyuria, polydipsia, excessive hunger, nausea No significant hypoglycemic spells noted. Medications as noted below. Taking them regularly without complication/adverse reaction being reported today.  Depression screen [facility] 2/9 02/23/2018 02/10/2018 01/21/2018  Decreased Interest 0 0 0  Down, Depressed, Hopeless 0 0 0  PHQ - 2 Score 0 0 0  Altered sleeping - - -  Tired, decreased energy - - -  Change in appetite - - -  Feeling bad or failure about yourself  - - -  Trouble concentrating - - -  Moving slowly or fidgety/restless - - -  Suicidal thoughts - - -  PHQ-9 Score - - -  Difficult doing work/chores - - -  Some recent data might be hidden    History [name] has a past medical history of Anemia, Arthritis, Asthma, Atrial fibrillation ([place]), CAD (coronary artery disease), Cataract, CHF (congestive heart failure) ([place]), COPD (chronic obstructive pulmonary disease) ([place]), Diabetes mellitus, Gait instability, GERD (gastroesophageal reflux disease), Hyperlipidemia, Hypertension, Obesity, Oxygen dependent, Sleep apnea, obstructive (2008), and Stroke ([place]).   She has a past surgical  history that includes Cataract extraction w/ intraocular lens  implant, bilateral; Appendectomy; Cholecystectomy; Carpal tunnel release; Cervical spine surgery; Cesarean section; Umbilical hernia repair; Knee arthroscopy; Colonoscopy (11/22/2002); Upper gastrointestinal endoscopy (11/22/2002); Colonoscopy (11/26/2005); Femur IM nail (Left, 05/08/2013); Hip surgery (Left); and Eye surgery.   Her family history includes Alzheimer's disease in her father and mother; Asthma in her daughter and grandchild; Breast cancer in her sister; Cancer in her brother, mother, sister, and sister; Colon cancer in her mother; Diabetes in her daughter, mother, sister, and unknown relative; Heart attack in her mother, sister, and unknown relative; Heart disease in her brother, mother, and sister; Kidney disease in her mother; Migraines in her sister.She reports that she has never smoked. She has never used smokeless tobacco. She reports that she does not drink alcohol or use drugs.    ROS Review of Systems  Constitutional: Negative for activity change, appetite change and fever.  HENT: Negative for congestion, rhinorrhea and sore throat.   Eyes: Negative for visual disturbance.  Respiratory: Negative for cough and shortness of breath.   Cardiovascular: Negative for chest pain and palpitations.  Gastrointestinal: Negative for abdominal pain, diarrhea and nausea.  Genitourinary: Negative for dysuria.  Musculoskeletal: Negative for arthralgias and myalgias.    Objective:  BP 136/80   Pulse 81   Temp (!) 97 F (36.1 C) (Oral)   Ht 5' 5"  (1.651 m)   Wt 283 lb 4 oz (128.5 kg)   BMI 47.14 kg/m   BP Readings from Last 3 Encounters:  02/23/18 136/80  02/10/18 (!) 154/81  01/21/18 126/61    Wt Readings from Last  3 Encounters:  02/23/18 283 lb 4 oz (128.5 kg)  02/10/18 286 lb 8 oz (130 kg)  01/21/18 286 lb (129.7 kg)     Physical Exam  Constitutional: She is oriented to person, place, and time. She appears  well-developed and well-nourished. No distress.  HENT:  Head: Normocephalic and atraumatic.  Eyes: Pupils are equal, round, and reactive to light. Conjunctivae are normal.  Neck: Normal range of motion. Neck supple. No thyromegaly present.  Cardiovascular: Normal rate, regular rhythm and normal heart sounds.  No murmur heard. Pulmonary/Chest: Effort normal and breath sounds normal. No respiratory distress. She has no wheezes. She has no rales.  Abdominal: Soft.  Musculoskeletal: Normal range of motion.  Lymphadenopathy:    She has no cervical adenopathy.  Neurological: She is alert and oriented to person, place, and time.  Skin: Skin is warm and dry.  Psychiatric: She has a normal mood and affect. Her behavior is normal. Judgment and thought content normal.   Results for orders placed or performed in visit on 02/23/18  CoaguChek XS/INR Waived  Result Value Ref Range   INR 1.2 (H) 0.9 - 1.1   Prothrombin Time 14.3 sec  Bayer DCA Hb A1c Waived  Result Value Ref Range   Bayer DCA Hb A1c Waived 6.9 <7.0 %  POCT INR  Result Value Ref Range   INR 1.2      Diabetic Foot Form - Detailed   Diabetic Foot Exam - detailed Diabetic Foot exam was performed with the following findings:  Yes 02/23/2018 10:55 AM  Visual Foot Exam completed.:  Yes  Can the patient see the bottom of their feet?:  No Are the shoes appropriate in style and fit?:  No Is there swelling or and abnormal foot shape?:  Yes Is there a claw toe deformity?:  Yes Is there elevated skin temparature?:  Yes Is there foot or ankle muscle weakness?:  Yes Normal Range of Motion:  No Pulse Foot Exam completed.:  Yes  Right posterior Tibialias:  Absent Left posterior Tibialias:  Absent  Right Dorsalis Pedis:  Diminished Left Dorsalis Pedis:  Diminished  Sensory Foot Exam Completed.:  Yes Semmes-Weinstein Monofilament Test R Site 1-Great Toe:  Neg L Site 1-Great Toe:  Pos        Assessment & Plan:   [name] was seen today  for follow-up.  Diagnoses and all orders for this visit:  Current use of long term anticoagulation -     CoaguChek XS/INR Waived  Chronic atrial fibrillation (HCC)  Long-term current use of insulin for diabetes mellitus ([place]) -     Bayer DCA Hb A1c Waived  Mixed hyperlipidemia -     Lipid panel  Diabetic nephropathy associated with type 2 diabetes mellitus ([place])  Other orders -     POCT INR -     warfarin (COUMADIN) 5 MG tablet; Take 1 tablet (5 mg total) by mouth daily. -     warfarin (COUMADIN) 1 MG tablet; Take 2 tablets (2 mg total) by mouth daily.       I am having [name] start on warfarin. I am also having her maintain her fish oil-omega-3 fatty acids, Lancets, JOBST RELIEF 30-40MMHG XL, fluocinonide-emollient, glucose blood, TRUE METRIX GO GLUCOSE METER, TRULICITY, acetaminophen, ferrous sulfate, vitamin B-12, Multiple Vitamins-Minerals (HAIR SKIN AND NAILS FORMULA PO), diphenhydrAMINE, albuterol, alendronate, levothyroxine, econazole nitrate, diltiazem, lisinopril, potassium chloride SA, LEVEMIR FLEXTOUCH, ipratropium-albuterol, torsemide, metFORMIN, B-D ULTRAFINE III SHORT PEN, and warfarin.  Allergies as of  02/23/2018      Reactions   Lisinopril    R/t to kidney   Codeine Nausea And Vomiting      Medication List        Accurate as of 02/23/18  5:23 PM. Always use your most recent med list.          acetaminophen 500 MG tablet Commonly known as:  TYLENOL Take by mouth daily.   albuterol 108 (90 Base) MCG/ACT inhaler Commonly known as:  PROVENTIL HFA;VENTOLIN HFA Inhale 2 puffs into the lungs every 6 (six) hours as needed for wheezing or shortness of breath.   alendronate 70 MG tablet Commonly known as:  FOSAMAX Take 1 tablet (70 mg total) by mouth every 7 (seven) days.   B-D ULTRAFINE III SHORT PEN 31G X 8 MM Misc Generic drug:  Insulin Pen Needle Use to inject insulin twice daily as directed   diltiazem 180 MG 24 hr capsule Commonly  known as:  CARDIZEM CD TAKE 1 CAPSULE (180 MG TOTAL) BY MOUTH DAILY.   diphenhydrAMINE 25 MG tablet Commonly known as:  SOMINEX Take 50 mg by mouth at bedtime as needed for sleep.   econazole nitrate 1 % cream Apply topically daily.   ferrous sulfate 325 (65 FE) MG tablet Take 325 mg by mouth daily with breakfast.   fish oil-omega-3 fatty acids 1000 MG capsule Take 2 g by mouth daily.   fluocinonide-emollient 0.05 % cream Commonly known as:  LIDEX-E Apply to feet after soaking each night   glucose blood test strip test sugars twice daily   HAIR SKIN AND NAILS FORMULA PO Take 1 tablet by mouth daily.   ipratropium-albuterol 0.5-2.5 (3) MG/3ML Soln Commonly known as:  DUONEB INHALE 1 VIAL (3ML) EVERY 4 HOURS BY NEBULIZATION   JOBST RELIEF 30-40MMHG XL Misc Wear daily for swelling in legs   Lancets Misc   LEVEMIR FLEXTOUCH 100 UNIT/ML Pen Generic drug:  Insulin Detemir INJECT 70 UNITS INTO THE SKIN DAILY WITH BREAKFAST.   levothyroxine 50 MCG tablet Commonly known as:  SYNTHROID, LEVOTHROID Take 1 tablet (50 mcg total) by mouth daily.   lisinopril 5 MG tablet Commonly known as:  PRINIVIL,ZESTRIL Take 1 tablet (5 mg total) by mouth daily.   metFORMIN 750 MG 24 hr tablet Commonly known as:  GLUCOPHAGE-XR TAKE 1 TABLET (750 MG TOTAL) BY MOUTH DAILY WITH BREAKFAST.   potassium chloride SA 20 MEQ tablet Commonly known as:  KLOR-CON M20 Take 1 tablet (20 mEq total) by mouth daily.   torsemide 100 MG tablet Commonly known as:  DEMADEX Take 1 tablet (100 mg total) by mouth daily. Take 2 tablets every morning and 1 tablet at noon.   TRUE METRIX GO GLUCOSE METER w/Device Kit 1 strip by Does not apply route 2 (two) times daily.   TRULICITY 5.18 AC/1.6SA Sopn Generic drug:  Dulaglutide INJECT 0.75 MG INTO THE SKIN ONCE A WEEK.   vitamin B-12 50 MCG tablet Commonly known as:  CYANOCOBALAMIN Take 50 mcg by mouth daily.   warfarin 5 MG tablet Commonly known as:   COUMADIN Take as directed by the anticoagulation clinic. If you are unsure how to take this medication, talk to your nurse or doctor. Original instructions:  Take 1 tablet (5 mg total) by mouth daily.   warfarin 1 MG tablet Commonly known as:  COUMADIN Take as directed by the anticoagulation clinic. If you are unsure how to take this medication, talk to your nurse or doctor. Original instructions:  Take 2 tablets (2 mg total) by mouth daily.      In addition to increasing her warfarin she was advised to keep her other medicines as is and follow-up in 2 weeks.  Follow-up: Return in about 2 weeks (around 03/09/2018).  Claretta Fraise, M.D.

## 2018-02-24 LAB — LIPID PANEL
CHOL/HDL RATIO: 3 ratio (ref 0.0–4.4)
Cholesterol, Total: 152 mg/dL (ref 100–199)
HDL: 50 mg/dL (ref >39)
LDL CALC: 78 mg/dL (ref 0–99)
TRIGLYCERIDES: 119 mg/dL (ref 0–149)
VLDL Cholesterol Cal: 24 mg/dL (ref 5–40)

## 2018-02-25 ENCOUNTER — Other Ambulatory Visit: Payer: Self-pay | Admitting: Family Medicine

## 2018-03-01 ENCOUNTER — Ambulatory Visit: Payer: Medicare HMO | Admitting: *Deleted

## 2018-03-02 ENCOUNTER — Ambulatory Visit (INDEPENDENT_AMBULATORY_CARE_PROVIDER_SITE_OTHER): Payer: Medicare HMO | Admitting: Family Medicine

## 2018-03-02 ENCOUNTER — Encounter: Payer: Self-pay | Admitting: Family Medicine

## 2018-03-02 VITALS — BP 142/69 | HR 84 | Ht 65.0 in | Wt 285.0 lb

## 2018-03-02 DIAGNOSIS — Z7901 Long term (current) use of anticoagulants: Secondary | ICD-10-CM

## 2018-03-02 DIAGNOSIS — I482 Chronic atrial fibrillation, unspecified: Secondary | ICD-10-CM

## 2018-03-02 LAB — POCT INR: INR: 3.5

## 2018-03-02 LAB — COAGUCHEK XS/INR WAIVED
INR: 3.5 — ABNORMAL HIGH (ref 0.9–1.1)
Prothrombin Time: 42 s

## 2018-03-02 MED ORDER — APIXABAN 5 MG PO TABS: 5.0000 mg | ORAL_TABLET | Freq: Two times a day (BID) | ORAL | 2 refills | Status: DC

## 2018-03-02 NOTE — Progress Notes (Signed)
Subjective:  Patient ID: [name], female    DOB: Jul 23, 1946  Age: 72 y.o. MRN: [id number]  CC: Follow-up (pt here today for INR currently taking 51m warfarin daily)   HPI MSYA NESTLERpresents for Patient in for follow-up of atrial fibrillation. Patient denies any recent bouts of chest pain or palpitations. Additionally, patient is taking anticoagulants. Patient denies any recent excessive bleeding episodes including epistaxis, bleeding from the gums, genitalia, rectal bleeding or hematuria. Additionally there has been no excessive bruising.  Patient states that she is eating greens and rather steady constant amounts.  She is aware that although nutritious they can have fairly significant amounts of vitamin K.  If she cuts back or increases then it will affect her INR.  [facility]/02/2018 02/23/2018 02/10/2018  Decreased Interest 0 0 0  Down, Depressed, Hopeless 0 0 0  PHQ - 2 Score 0 0 0  Altered sleeping - - -  Tired, decreased energy - - -  Change in appetite - - -  Feeling bad or failure about yourself  - - -  Trouble concentrating - - -  Moving slowly or fidgety/restless - - -  Suicidal thoughts - - -  PHQ-9 Score - - -  Difficult doing work/chores - - -  Some recent data might be hidden    History Raianna has a past medical history of Anemia, Arthritis, Asthma, Atrial fibrillation [place], CAD (coronary artery disease), Cataract, CHF (congestive heart failure) ([place], COPD (chronic obstructive pulmonary disease) ([place], Diabetes mellitus, Gait instability, GERD (gastroesophageal reflux disease), Hyperlipidemia, Hypertension, Obesity, Oxygen dependent, Sleep apnea, obstructive (2008), and Stroke ([place].   She has a past surgical history that includes Cataract extraction w/ intraocular lens  implant, bilateral; Appendectomy; Cholecystectomy; Carpal tunnel release; Cervical spine surgery; Cesarean section; Umbilical hernia repair; Knee arthroscopy; Colonoscopy  (11/22/2002); Upper gastrointestinal endoscopy (11/22/2002); Colonoscopy (11/26/2005); Femur IM nail (Left, 05/08/2013); Hip surgery (Left); and Eye surgery.   Her family history includes Alzheimer's disease in her father and mother; Asthma in her daughter and grandchild; Breast cancer in her sister; Cancer in her brother, mother, sister, and sister; Colon cancer in her mother; Diabetes in her daughter, mother, sister, and unknown relative; Heart attack in her mother, sister, and unknown relative; Heart disease in her brother, mother, and sister; Kidney disease in her mother; Migraines in her sister.She reports that she has never smoked. She has never used smokeless tobacco. She reports that she does not drink alcohol or use drugs.    ROS Review of Systems  Constitutional: Negative for activity change and appetite change.  HENT: Negative.   Hematological: Does not bruise/bleed easily.    Objective:  BP (!) 142/69   Pulse 84   Ht 5' 5"  (1.651 m)   Wt 285 lb (129.3 kg)   BMI 47.43 kg/m   BP Readings from Last 3 Encounters:  03/02/18 (!) 142/69  02/23/18 136/80  02/10/18 (!) 154/81    Wt Readings from Last 3 Encounters:  03/02/18 285 lb (129.3 kg)  02/23/18 283 lb 4 oz (128.5 kg)  02/10/18 286 lb 8 oz (130 kg)     Physical Exam  Constitutional: She is oriented to person, place, and time. She appears well-developed and well-nourished. No distress.  Cardiovascular: Normal rate and regular rhythm.  Pulmonary/Chest: Breath sounds normal.  Neurological: She is alert and oriented to person, place, and time.  Skin: Skin is warm and dry.  Psychiatric: She has a normal mood and  affect.      Assessment & Plan:   R[place]hell was seen today for follow-up.  Diagnoses and all orders for this visit:  Current use of long term anticoagulation -     CoaguChek XS/INR Waived  Chronic atrial fibrillation ([place])  Other orders -     POCT INR -     apixaban (ELIQUIS) 5 MG TABS tablet; Take 1  tablet (5 mg total) by mouth 2 (two) times daily.   Anticoagulation Summary  As of 03/02/2018   INR goal:   2.0-3.0  TTR:   20.2 % (1.2 y)  INR used for dosing:   3.5! (03/02/2018)  Warfarin maintenance plan:   6 mg (1 mg x 2 and 4 mg x 1) every day  Weekly warfarin total:   42 mg  Plan last modified:   Claretta Fraise, MD (03/02/2018)  Next INR check:   03/09/2018  Priority:   Routine  Target end date:      Indications   Atrial fibrillation ([place]) [I48.91]        Anticoagulation Episode Summary    INR check location:   Clinic Lab   Preferred lab:      Send INR reminders to:   ANT[facility]   Comments:       Anticoagulation Care Providers    Provider Role Specialty Phone number   Claretta Fraise, MD Referring Family Medicine [phone]         I am having Cecylia L. Misch start on apixaban. I am also having her maintain her fish oil-omega-3 fatty acids, Lancets, JOBST RELIEF 30-40MMHG XL, fluocinonide-emollient, glucose blood, TRUE METRIX GO GLUCOSE METER, TRULICITY, acetaminophen, ferrous sulfate, vitamin B-12, Multiple Vitamins-Minerals (HAIR SKIN AND NAILS FORMULA PO), diphenhydrAMINE, albuterol, alendronate, levothyroxine, econazole nitrate, diltiazem, lisinopril, potassium chloride SA, LEVEMIR FLEXTOUCH, ipratropium-albuterol, torsemide, metFORMIN, B-D ULTRAFINE III SHORT PEN, warfarin, warfarin, and torsemide.  Allergies as of 03/02/2018      Reactions   Lisinopril    R/t to kidney   Codeine Nausea And Vomiting      Medication List        Accurate as of 03/02/18 11:59 PM. Always use your most recent med list.          acetaminophen 500 MG tablet Commonly known as:  TYLENOL Take by mouth daily.   albuterol 108 (90 Base) MCG/ACT inhaler Commonly known as:  PROVENTIL HFA;VENTOLIN HFA Inhale 2 puffs into the lungs every 6 (six) hours as needed for wheezing or shortness of breath.   alendronate 70 MG tablet Commonly known as:  FOSAMAX Take 1 tablet (70 mg total) by  mouth every 7 (seven) days.   apixaban 5 MG Tabs tablet Commonly known as:  ELIQUIS Take 1 tablet (5 mg total) by mouth 2 (two) times daily.   B-D ULTRAFINE III SHORT PEN 31G X 8 MM Misc Generic drug:  Insulin Pen Needle Use to inject insulin twice daily as directed   diltiazem 180 MG 24 hr capsule Commonly known as:  CARDIZEM CD TAKE 1 CAPSULE (180 MG TOTAL) BY MOUTH DAILY.   diphenhydrAMINE 25 MG tablet Commonly known as:  SOMINEX Take 50 mg by mouth at bedtime as needed for sleep.   econazole nitrate 1 % cream Apply topically daily.   ferrous sulfate 325 (65 FE) MG tablet Take 325 mg by mouth daily with breakfast.   fish oil-omega-3 fatty acids 1000 MG capsule Take 2 g by mouth daily.   fluocinonide-emollient 0.05 % cream Commonly known as:  LIDEX-E Apply to feet after soaking each night   glucose blood test strip test sugars twice daily   HAIR SKIN AND NAILS FORMULA PO Take 1 tablet by mouth daily.   ipratropium-albuterol 0.5-2.5 (3) MG/3ML Soln Commonly known as:  DUONEB INHALE 1 VIAL (3ML) EVERY 4 HOURS BY NEBULIZATION   JOBST RELIEF 30-40MMHG XL Misc Wear daily for swelling in legs   Lancets Misc   LEVEMIR FLEXTOUCH 100 UNIT/ML Pen Generic drug:  Insulin Detemir INJECT 70 UNITS INTO THE SKIN DAILY WITH BREAKFAST.   levothyroxine 50 MCG tablet Commonly known as:  SYNTHROID, LEVOTHROID Take 1 tablet (50 mcg total) by mouth daily.   lisinopril 5 MG tablet Commonly known as:  PRINIVIL,ZESTRIL Take 1 tablet (5 mg total) by mouth daily.   metFORMIN 750 MG 24 hr tablet Commonly known as:  GLUCOPHAGE-XR TAKE 1 TABLET (750 MG TOTAL) BY MOUTH DAILY WITH BREAKFAST.   potassium chloride SA 20 MEQ tablet Commonly known as:  KLOR-CON M20 Take 1 tablet (20 mEq total) by mouth daily.   torsemide 100 MG tablet Commonly known as:  DEMADEX Take 1 tablet (100 mg total) by mouth daily. Take 2 tablets every morning and 1 tablet at noon.   torsemide 20 MG  tablet Commonly known as:  DEMADEX TAKE 2 TABLETS BY MOUTH EVERY DAY   TRUE METRIX GO GLUCOSE METER w/Device Kit 1 strip by Does not apply route 2 (two) times daily.   TRULICITY 1.61 WR/6.0AV Sopn Generic drug:  Dulaglutide INJECT 0.75 MG INTO THE SKIN ONCE A WEEK.   vitamin B-12 50 MCG tablet Commonly known as:  CYANOCOBALAMIN Take 50 mcg by mouth daily.   warfarin 5 MG tablet Commonly known as:  COUMADIN Take as directed by the anticoagulation clinic. If you are unsure how to take this medication, talk to your nurse or doctor. Original instructions:  Take 1 tablet (5 mg total) by mouth daily.   warfarin 1 MG tablet Commonly known as:  COUMADIN Take as directed by the anticoagulation clinic. If you are unsure how to take this medication, talk to your nurse or doctor. Original instructions:  Take 2 tablets (2 mg total) by mouth daily.       Anticoagulation Summary  As of 03/02/2018   INR goal:   2.0-3.0  TTR:   20.2 % (1.2 y)  INR used for dosing:   3.5! (03/02/2018)  Warfarin maintenance plan:   6 mg (1 mg x 2 and 4 mg x 1) every day  Weekly warfarin total:   42 mg  Plan last modified:   Claretta Fraise, MD (03/02/2018)  Next INR check:   03/09/2018  Priority:   Routine  Target end date:      Indications   Atrial fibrillation ([place]) [I48.91]        Anticoagulation Episode Summary    INR check location:   Clinic Lab   Preferred lab:      Send INR reminders to:   [facility]   Comments:       Anticoagulation Care Providers    Provider Role Specialty Phone number   Claretta Fraise, MD Referring Family Medicine [phone]      Follow-up: Return in about 1 week (around 03/09/2018).  Claretta Fraise, M.D.

## 2018-03-07 ENCOUNTER — Encounter: Payer: Self-pay | Admitting: Family Medicine

## 2018-03-08 ENCOUNTER — Ambulatory Visit (INDEPENDENT_AMBULATORY_CARE_PROVIDER_SITE_OTHER): Payer: Medicare HMO | Admitting: *Deleted

## 2018-03-08 ENCOUNTER — Encounter: Payer: Self-pay | Admitting: *Deleted

## 2018-03-08 VITALS — BP 135/65 | HR 75 | Ht 65.0 in | Wt 276.0 lb

## 2018-03-08 DIAGNOSIS — Z0000 Encounter for general adult medical examination without abnormal findings: Secondary | ICD-10-CM | POA: Diagnosis not present

## 2018-03-08 DIAGNOSIS — Z1211 Encounter for screening for malignant neoplasm of colon: Secondary | ICD-10-CM

## 2018-03-08 DIAGNOSIS — Z1212 Encounter for screening for malignant neoplasm of rectum: Secondary | ICD-10-CM

## 2018-03-08 NOTE — Progress Notes (Signed)
   Subjective:   [name] is a 72 y.o. female who presents for an Initial Medicare Annual Wellness Visit. Ms Weissinger lives at home with her daughter and son-in-law. She has small dogs but says that they do not pose a tripping hazard. She uses a rolling walker with a seat due to gait instability.   Review of Systems    States that her overall health is worse than last year.   Cardiac Risk Factors include: advanced age (>55men, >65 women);dyslipidemia;hypertension;sedentary lifestyle;obesity (BMI >30kg/m2);diabetes mellitus  Patient has chronic A-fib and has had a difficult time with INR control. Recently switched form Coumadin to Eliquis due to increased risks with out of range INRs.     Objective:    Today's Vitals   03/08/18 1129  BP: 135/65  Pulse: 75  Weight: 276 lb (125.2 kg)  Height: 5' 5" (1.651 m)   Body mass index is 45.93 kg/m.  Advanced Directives 03/08/2018 11/20/2016 11/19/2015 05/06/2013  Does Patient Have a Medical Advance Directive? No No No Patient does not have advance directive;Patient would like information  Would patient like information on creating a medical advance directive? Yes (MAU/Ambulatory/Procedural Areas - Information given) Yes (MAU/Ambulatory/Procedural Areas - Information given) No - patient declined information Advance directive packet given  Pre-existing out of facility DNR order (yellow form or pink MOST form) - - - No    Current Medications (verified) Outpatient Encounter Medications as of 03/08/2018  Medication Sig  . acetaminophen (TYLENOL) 500 MG tablet Take by mouth daily.  . albuterol (PROVENTIL HFA;VENTOLIN HFA) 108 (90 Base) MCG/ACT inhaler Inhale 2 puffs into the lungs every 6 (six) hours as needed for wheezing or shortness of breath.  . alendronate (FOSAMAX) 70 MG tablet Take 1 tablet (70 mg total) by mouth every 7 (seven) days.  . apixaban (ELIQUIS) 5 MG TABS tablet Take 1 tablet (5 mg total) by mouth 2 (two) times daily.  .  B-D ULTRAFINE III SHORT PEN 31G X 8 MM MISC Use to inject insulin twice daily as directed  . Blood Glucose Monitoring Suppl (TRUE METRIX GO GLUCOSE METER) w/Device KIT 1 strip by Does not apply route 2 (two) times daily.  . diltiazem (CARDIZEM CD) 180 MG 24 hr capsule TAKE 1 CAPSULE (180 MG TOTAL) BY MOUTH DAILY.  . diphenhydrAMINE (SOMINEX) 25 MG tablet Take 50 mg by mouth at bedtime as needed for sleep.  . econazole nitrate 1 % cream Apply topically daily.  . Elastic Bandages & Supports (JOBST RELIEF 30-40MMHG XL) MISC Wear daily for swelling in legs  . ferrous sulfate 325 (65 FE) MG tablet Take 325 mg by mouth daily with breakfast.  . fish oil-omega-3 fatty acids 1000 MG capsule Take 2 g by mouth daily.    . fluocinonide-emollient (LIDEX-E) 0.05 % cream Apply to feet after soaking each night  . glucose blood test strip test sugars twice daily  . ipratropium-albuterol (DUONEB) 0.5-2.5 (3) MG/3ML SOLN INHALE 1 VIAL (3ML) EVERY 4 HOURS BY NEBULIZATION  . Lancets MISC   . LEVEMIR FLEXTOUCH 100 UNIT/ML Pen INJECT 70 UNITS INTO THE SKIN DAILY WITH BREAKFAST.  . levothyroxine (SYNTHROID, LEVOTHROID) 50 MCG tablet Take 1 tablet (50 mcg total) by mouth daily.  . lisinopril (PRINIVIL,ZESTRIL) 5 MG tablet Take 1 tablet (5 mg total) by mouth daily.  . metFORMIN (GLUCOPHAGE-XR) 750 MG 24 hr tablet TAKE 1 TABLET (750 MG TOTAL) BY MOUTH DAILY WITH BREAKFAST.  . Multiple Vitamins-Minerals (HAIR SKIN AND NAILS FORMULA PO) Take   1 tablet by mouth daily.  . potassium chloride SA (KLOR-CON M20) 20 MEQ tablet Take 1 tablet (20 mEq total) by mouth daily.  Marland Kitchen torsemide (DEMADEX) 100 MG tablet Take 1 tablet (100 mg total) by mouth daily. Take 2 tablets every morning and 1 tablet at noon.  . torsemide (DEMADEX) 20 MG tablet TAKE 2 TABLETS BY MOUTH EVERY DAY  . TRULICITY 0.10 UV/2.5DG SOPN INJECT 0.75 MG INTO THE SKIN ONCE A WEEK.  . vitamin B-12 (CYANOCOBALAMIN) 50 MCG tablet Take 50 mcg by mouth daily.  .  [DISCONTINUED] warfarin (COUMADIN) 1 MG tablet Take 2 tablets (2 mg total) by mouth daily.  . [DISCONTINUED] warfarin (COUMADIN) 5 MG tablet Take 1 tablet (5 mg total) by mouth daily.   No facility-administered encounter medications on file as of 03/08/2018.     Allergies (verified) Lisinopril and Codeine   History: Past Medical History:  Diagnosis Date  . Anemia   . Arthritis   . Asthma   . Atrial fibrillation ([place])   . CAD (coronary artery disease)    LAD 60% proximal stenosis mid and distal 60% stenosis 2009.  . Cataract    had surgery  . CHF (congestive heart failure) ([place])   . COPD (chronic obstructive pulmonary disease) ([place])   . Diabetes mellitus   . Gait instability    uses cane  . GERD (gastroesophageal reflux disease)   . Hyperlipidemia   . Hypertension   . Obesity   . Oxygen dependent    2 liter per nasal cannula  . Sleep apnea, obstructive 2008  . Stroke [facility])    2 mini   Past Surgical History:  Procedure Laterality Date  . APPENDECTOMY    . CARPAL TUNNEL RELEASE     right hand  . CATARACT EXTRACTION W/ INTRAOCULAR LENS  IMPLANT, BILATERAL    . CERVICAL SPINE SURGERY     fusion  . CESAREAN SECTION    . CHOLECYSTECTOMY    . COLONOSCOPY  11/22/2002   normal - Dr.Rrourk  . COLONOSCOPY  11/26/2005   incomplete with negative BE (Dr. Rowe Pavy, [place])  . EYE SURGERY    . FEMUR IM NAIL Left 05/08/2013   Procedure: INTRAMEDULLARY (IM) NAIL FEMORAL--LONG(BIOMET SYSTEM) and Zimmer Cables;  Surgeon: Augustin Schooling, MD;  Location: [place];  Service: Orthopedics;  Laterality: Left;  . HIP SURGERY Left   . KNEE ARTHROSCOPY     twice, left  . UMBILICAL HERNIA REPAIR    . UPPER GASTROINTESTINAL ENDOSCOPY  11/22/2002   Normal - Dr. Gala Romney   Family History  Problem Relation Age of Onset  . Colon cancer Mother   . Heart attack Mother   . Alzheimer's disease Mother        father  . Diabetes Mother   . Heart disease Mother   . Cancer Mother        colon  . Kidney  disease Mother   . Cancer Sister        brain and spine  . Migraines Sister   . Alzheimer's disease Father   . Cancer Brother        stomach  . Diabetes Sister   . Cancer Sister        breast  . Heart disease Sister   . Heart attack Sister   . Breast cancer Sister   . Heart disease Brother   . Heart murmur Brother   . Diabetes Unknown   . Heart attack Unknown   . Diabetes  Daughter   . Asthma Daughter   . Asthma Grandchild    Social History   Socioeconomic History  . Marital status: Widowed    Spouse name: Not on file  . Number of children: 1  . Years of education: 7th  . Highest education level: 7th grade  Occupational History  . Occupation: Disabled  . Occupation: Retired    Comment: [employer]  Social Needs  . Financial resource strain: Not very hard  . Food insecurity:    Worry: Never true    Inability: Never true  . Transportation needs:    Medical: No    Non-medical: No  Tobacco Use  . Smoking status: Never Smoker  . Smokeless tobacco: Never Used  Substance and Sexual Activity  . Alcohol use: No  . Drug use: No  . Sexual activity: Not Currently  Lifestyle  . Physical activity:    Days per week: 0 days    Minutes per session: 0 min  . Stress: Not at all  Relationships  . Social connections:    Talks on phone: More than three times a week    Gets together: More than three times a week    Attends religious service: 1 to 4 times per year    Active member of club or organization: Yes    Attends meetings of clubs or organizations: More than 4 times per year    Relationship status: Widowed  Other Topics Concern  . Not on file  Social History Narrative  . Not on file    Tobacco Counseling Counseling given: Not Answered   Clinical Intake:     Pain : No/denies pain     Diabetes: Yes CBG done?: No Did pt. bring in CBG monitor from home?: No  How often do you need to have someone help you when you read instructions, pamphlets,  or other written materials from your doctor or pharmacy?: 4 - Often What is the last grade level you completed in school?: 7th  Interpreter Needed?: No  Information entered by ::  , RN   Activities of Daily Living In your present state of health, do you have any difficulty performing the following activities: 03/08/2018  Hearing? N  Vision? N  Comment last eye exam was 2 years. Last was at [facility]   Difficulty concentrating or making decisions? Y  Walking or climbing stairs? Y  Comment uses a rolling walker with seat  Dressing or bathing? N  Doing errands, shopping? N  Preparing Food and eating ? N  Using the Toilet? N  In the past six months, have you accidently leaked urine? N  Do you have problems with loss of bowel control? N  Managing your Medications? N  Comment recommend a pill box  Managing your Finances? N  Housekeeping or managing your Housekeeping? N  Some recent data might be hidden     Immunizations and Health Maintenance Immunization History  Administered Date(s) Administered  . Influenza,inj,Quad PF,6+ Mos 08/23/2013, 09/15/2014, 09/11/2015, 09/02/2016, 08/26/2017  . Pneumococcal Conjugate-13 09/15/2014  . Pneumococcal Polysaccharide-23 11/12/2015  . Tdap 03/09/2012  . Zoster 09/03/2012   Health Maintenance Due  Topic Date Due  . Hepatitis C Screening  06/30/1946  . OPHTHALMOLOGY EXAM  07/22/2017    Patient Care Team: Stacks, Warren, MD as PCP - General (Family Medicine) McLeod, William, MD as Consulting Physician (Gynecology) Hochrein, James, MD as Consulting Physician (Cardiology)  Hospitalized at the end of 2018. No other surgeries, ER   visits, or hospitalizations.    Assessment:   This is a routine wellness examination for Estelene.  Hearing/Vision screen No deficits noted during visit.  Dietary issues and exercise activities discussed: Current Exercise Habits: The patient does not participate in regular exercise at present,  Exercise limited by: orthopedic condition(s);cardiac condition(s)  Goals    . Exercise 150 min/wk Moderate Activity      Depression Screen PHQ 2/9 Scores 03/08/2018 03/02/2018 02/23/2018 02/10/2018 01/21/2018 01/06/2018 12/04/2017  PHQ - 2 Score 0 0 0 0 0 0 0  PHQ- 9 Score - - - - - - -    Fall Risk Fall Risk  03/08/2018 03/02/2018 02/23/2018 02/10/2018 01/21/2018  Falls in the past year? _0   Comment - - - - -  Number falls in past yr: _1 -  Injury with Fall? _2   Comment - - - - -  Risk Factor Category  High Fall Risk - - - -  Risk for fall due to : History of fall(s);Impaired balance/gait - - - -  Follow up Falls prevention discussed - - - -   Cognitive Function: MMSE - Mini Mental State Exam 03/08/2018 11/20/2016 11/19/2015  Not completed: Unable to complete - -  Orientation to time _3 Orientation to Place _4 Registration _5 Attention/ Calculation 0 3 4  Attention/Calculation-comments did not attempt due to literacy level - -  Recall _6 Language- name 2 objects _7 Language- repeat _8 Language- follow 3 step command _9 Language- read & follow direction _10 Write a sentence 0 1 0  Copy design _11 Total score _12 Unable to fully assess due to literacy level but possibly abnormal      Screening Tests Health Maintenance  Topic Date Due  . Hepatitis C Screening  1946-02-05  . OPHTHALMOLOGY EXAM  07/22/2017  . INFLUENZA VACCINE  07/01/2018  . HEMOGLOBIN A1C  08/26/2018  . MAMMOGRAM  11/26/2018  . FOOT EXAM  02/24/2019  . DEXA SCAN  01/22/2020  . TETANUS/TDAP  03/09/2022  . PNA vac Low Risk Adult  Completed  . COLONOSCOPY  Discontinued     Plan:  Advanced directives given Move carefully to avoid falls. Keep phone with you at all times Hep C screening at next visit Cologuard ordered Chair exercises daily. Handout given. Increase activity level slowly.  Buy a pill pox to organize your daily medications.    Schedule eye exam   I have personally reviewed and noted the following in the patient's chart:   . Medical and social history . Use of alcohol, tobacco or illicit drugs  . Current medications and supplements . Functional ability and status . Nutritional status . Physical activity . Advanced directives . List of other physicians . Hospitalizations, surgeries, and ER visits in previous 12 months . Vitals . Screenings to include cognitive, depression, and falls . Referrals and appointments  In addition, I have reviewed and discussed with patient certain preventive protocols, quality metrics, and best practice recommendations. A written personalized care plan for preventive services as well as general preventive health recommendations were provided to patient.     Chong Sicilian, RN   03/08/2018

## 2018-03-08 NOTE — Patient Instructions (Addendum)
  [name] , Thank you for taking time to come for your Medicare Wellness Visit. I appreciate your ongoing commitment to your health goals. Please review the following plan we discussed and let me know if I can assist you in the future.   These are the goals we discussed: Goals    . Exercise 150 min/wk Moderate Activity     Chair exercises daily. Handout given. Increase activity level slowly.  Move carefully to avoid falls.  Keep phone with you while moving around the house.  Buy a pill pox to organize your daily medications.  Schedule eye exam  I have ordered cologuard today and it will be shipped to your house.   This is a list of the screening recommended for you and due dates:  Health Maintenance  Topic Date Due  .  Hepatitis C: One time screening is recommended by Center for Disease Control  (CDC) for  adults born from [birth year] through 1967.   July 18, 1946  . Eye exam for diabetics  07/22/2017  . Flu Shot  07/01/2018  . Hemoglobin A1C  08/26/2018  . Mammogram  11/26/2018  . Complete foot exam   02/24/2019  . DEXA scan (bone density measurement)  01/22/2020  . Tetanus Vaccine  03/09/2022  . Pneumonia vaccines  Completed  . Colon Cancer Screening  Discontinued     Your doctor has prescribed Cologuard, an easy-to-use, noninvasive test for colon cancer screening, based on the latest advances in stool DNA science.   Here's what will happen next:  1. You may receive a call or email from [facility] to confirm your mailing address and insurance information 2. Your kit will be shipped directly to you 3. You collet your stool sample in the privacy of your own home 4. You return the kit via [facility] or pick-up, in the same box it arrived in 5. You doctor will contact you with the results once they are available  Screening for colon cancer is very important to your good health, so if you have any questions at all, please call [employer]'s Customer Support  Specialists at [phone]. They are available 24 hours a day, 6 days a week.

## 2018-03-09 ENCOUNTER — Ambulatory Visit (INDEPENDENT_AMBULATORY_CARE_PROVIDER_SITE_OTHER): Payer: Medicare HMO | Admitting: Family Medicine

## 2018-03-09 ENCOUNTER — Encounter: Payer: Self-pay | Admitting: Family Medicine

## 2018-03-09 VITALS — BP 137/73 | HR 80 | Ht 65.0 in | Wt 274.0 lb

## 2018-03-09 DIAGNOSIS — R5383 Other fatigue: Secondary | ICD-10-CM | POA: Diagnosis not present

## 2018-03-09 DIAGNOSIS — Z7901 Long term (current) use of anticoagulants: Secondary | ICD-10-CM

## 2018-03-09 LAB — COAGUCHEK XS/INR WAIVED
INR: 6.1 (ref 0.9–1.1)
PROTHROMBIN TIME: 73 s

## 2018-03-09 LAB — HEMOGLOBIN, FINGERSTICK: HEMOGLOBIN: 9.4 g/dL — AB (ref 11.1–15.9)

## 2018-03-09 NOTE — Progress Notes (Signed)
Chief Complaint  Patient presents with  . Follow-up    pt here today for PROTIME    HPI  Patient presents today for Patient in for follow-up of atrial fibrillation. Patient denies any recent bouts of chest pain or palpitations. Additionally, patient is taking anticoagulants. Patient denies any recent excessive bleeding episodes including epistaxis, bleeding from the gums, genitalia, rectal bleeding or hematuria. Additionally there has been no excessive bruising.  Patient notes that she discontinued the Coumadin the night before last and started Eliquis yesterday.  PMH: Smoking status noted ROS: Per HPI  Objective: BP 137/73   Pulse 80   Ht 5\' 5"  (1.651 m)   Wt 274 lb (124.3 kg)   BMI 45.60 kg/m  Gen: NAD, alert, cooperative with exam HEENT: NCAT, EOMI, PERRL CV: RRR, good S1/S2, no murmur Resp: CTABL, no wheezes, non-labored Abd: SNTND, BS present, no guarding or organomegaly Ext: No edema, warm Neuro: Alert and oriented, No gross deficits  Assessment and plan:  1. Current use of long term anticoagulation   2. Other fatigue    I am concerned that the combination of the 2 meds may lead to excessive bleeding due to her elevated INR she was cautioned about signs of bleeding and to get immediate help.  She has discontinued her Coumadin but it may take several days for her INR to drop back into the normal range. No orders of the defined types were placed in this encounter.   Orders Placed This Encounter  Procedures  . CoaguChek XS/INR Waived  . Hemoglobin, fingerstick  . Protime-INR   Follow-up in 1 month  Claretta Fraise, MD

## 2018-03-10 LAB — PROTIME-INR
INR: 3.6 — ABNORMAL HIGH (ref 0.8–1.2)
Prothrombin Time: 35 s — ABNORMAL HIGH (ref 9.1–12.0)

## 2018-03-17 ENCOUNTER — Ambulatory Visit (INDEPENDENT_AMBULATORY_CARE_PROVIDER_SITE_OTHER): Payer: Medicare HMO | Admitting: Family Medicine

## 2018-03-17 ENCOUNTER — Encounter: Payer: Self-pay | Admitting: Family Medicine

## 2018-03-17 DIAGNOSIS — I482 Chronic atrial fibrillation, unspecified: Secondary | ICD-10-CM

## 2018-03-17 DIAGNOSIS — Z7901 Long term (current) use of anticoagulants: Secondary | ICD-10-CM | POA: Diagnosis not present

## 2018-03-17 MED ORDER — APIXABAN 5 MG PO TABS: 5.0000 mg | ORAL_TABLET | Freq: Two times a day (BID) | ORAL | 5 refills | Status: DC

## 2018-03-17 MED ORDER — METFORMIN HCL ER 750 MG PO TB24: 750.0000 mg | ORAL_TABLET | Freq: Every day | ORAL | 1 refills | Status: DC

## 2018-03-17 MED ORDER — LEVOTHYROXINE SODIUM 50 MCG PO TABS: 50.0000 ug | ORAL_TABLET | Freq: Every day | ORAL | 1 refills | Status: DC

## 2018-03-17 MED ORDER — DULAGLUTIDE 0.75 MG/0.5ML ~~LOC~~ SOAJ: 0.7500 mg | SUBCUTANEOUS | 3 refills | Status: DC

## 2018-03-17 MED ORDER — INSULIN DETEMIR 100 UNIT/ML FLEXPEN: PEN_INJECTOR | SUBCUTANEOUS | 3 refills | Status: DC

## 2018-03-17 MED ORDER — ALENDRONATE SODIUM 70 MG PO TABS: 70.0000 mg | ORAL_TABLET | ORAL | 2 refills | Status: DC

## 2018-03-17 MED ORDER — DILTIAZEM HCL ER COATED BEADS 180 MG PO CP24: ORAL_CAPSULE | ORAL | 1 refills | Status: DC

## 2018-03-18 ENCOUNTER — Other Ambulatory Visit: Payer: Self-pay | Admitting: Family Medicine

## 2018-03-18 DIAGNOSIS — I482 Chronic atrial fibrillation, unspecified: Secondary | ICD-10-CM

## 2018-03-18 DIAGNOSIS — Z7901 Long term (current) use of anticoagulants: Secondary | ICD-10-CM

## 2018-03-21 ENCOUNTER — Encounter: Payer: Self-pay | Admitting: Family Medicine

## 2018-03-21 NOTE — Progress Notes (Signed)
Subjective:  Patient ID: [name], female    DOB: 07/23/1946  Age: 72 y.o. MRN: [id number]  CC: Follow-up (pt here today for 1 week follow up)   HPI [name] presents for a Patient in for follow-up of atrial fibrillation. Patient denies any recent bouts of chest pain or palpitations. Additionally, patient is taking anticoagulants. Patient denies any recent excessive bleeding episodes including epistaxis, bleeding from the gums, genitalia, rectal bleeding or hematuria. Additionally there has been no excessive bruising.  She has been off of coumadin and transitioned over to eliquis for a week.    Depression screen [facility] 2/9 03/17/2018 03/08/2018 03/02/2018  Decreased Interest 0 0 0  Down, Depressed, Hopeless 0 0 0  PHQ - 2 Score 0 0 0  Altered sleeping - - -  Tired, decreased energy - - -  Change in appetite - - -  Feeling bad or failure about yourself  - - -  Trouble concentrating - - -  Moving slowly or fidgety/restless - - -  Suicidal thoughts - - -  PHQ-9 Score - - -  Difficult doing work/chores - - -  Some recent data might be hidden    History [name] has a past medical history of Anemia, Arthritis, Asthma, Atrial fibrillation ([place]), CAD (coronary artery disease), Cataract, CHF (congestive heart failure) ([place]), COPD (chronic obstructive pulmonary disease) ([place]), Diabetes mellitus, Gait instability, GERD (gastroesophageal reflux disease), Hyperlipidemia, Hypertension, Obesity, Oxygen dependent, Sleep apnea, obstructive (2008), and Stroke ([place]).   She has a past surgical history that includes Cataract extraction w/ intraocular lens  implant, bilateral; Appendectomy; Cholecystectomy; Carpal tunnel release; Cervical spine surgery; Cesarean section; Umbilical hernia repair; Knee arthroscopy; Colonoscopy (11/22/2002); Upper gastrointestinal endoscopy (11/22/2002); Colonoscopy (11/26/2005); Femur IM nail (Left, 05/08/2013); Hip surgery (Left); and Eye surgery.   Her family  history includes Alzheimer's disease in her father and mother; Asthma in her daughter and grandchild; Breast cancer in her sister; Cancer in her brother, mother, sister, and sister; Colon cancer in her mother; Diabetes in her daughter, mother, sister, and unknown relative; Heart attack in her mother, sister, and unknown relative; Heart disease in her brother, mother, and sister; Heart murmur in her brother; Kidney disease in her mother; Migraines in her sister.She reports that she has never smoked. She has never used smokeless tobacco. She reports that she does not drink alcohol or use drugs.    ROS Review of Systems  Constitutional: Negative.   HENT: Negative.   Eyes: Negative for visual disturbance.  Respiratory: Negative for shortness of breath.   Cardiovascular: Negative for chest pain.  Gastrointestinal: Negative for abdominal pain.  Musculoskeletal: Negative for arthralgias.    Objective:  BP 118/61   Pulse 72   Temp 98.4 F (36.9 C) (Oral)   Ht 5' 5"  (1.651 m)   Wt 279 lb 8 oz (126.8 kg)   BMI 46.51 kg/m   BP Readings from Last 3 Encounters:  03/17/18 118/61  03/09/18 137/73  03/08/18 135/65    Wt Readings from Last 3 Encounters:  03/17/18 279 lb 8 oz (126.8 kg)  03/09/18 274 lb (124.3 kg)  03/08/18 276 lb (125.2 kg)     Physical Exam  Constitutional: She is oriented to person, place, and time. She appears well-developed and well-nourished. No distress.  Cardiovascular: Normal rate and regular rhythm.  Pulmonary/Chest: Breath sounds normal.  Neurological: She is alert and oriented to person, place, and time.  Skin: Skin is warm and dry.  Psychiatric: She has  a normal mood and affect.      Assessment & Plan:   [name] was seen today for follow-up.  Diagnoses and all orders for this visit:  Current use of long term anticoagulation -     apixaban (ELIQUIS) 5 MG TABS tablet; Take 1 tablet (5 mg total) by mouth 2 (two) times daily.  Chronic atrial  fibrillation (HCC) -     apixaban (ELIQUIS) 5 MG TABS tablet; Take 1 tablet (5 mg total) by mouth 2 (two) times daily.  Other orders -     alendronate (FOSAMAX) 70 MG tablet; Take 1 tablet (70 mg total) by mouth every 7 (seven) days. -     diltiazem (CARDIZEM CD) 180 MG 24 hr capsule; TAKE 1 CAPSULE (180 MG TOTAL) BY MOUTH DAILY. -     Insulin Detemir (LEVEMIR FLEXTOUCH) 100 UNIT/ML Pen; INJECT 70 UNITS INTO THE SKIN DAILY WITH BREAKFAST. -     levothyroxine (SYNTHROID, LEVOTHROID) 50 MCG tablet; Take 1 tablet (50 mcg total) by mouth daily. -     metFORMIN (GLUCOPHAGE-XR) 750 MG 24 hr tablet; Take 1 tablet (750 mg total) by mouth daily with breakfast. -     Dulaglutide (TRULICITY) 8.88 BV/6.9IH SOPN; Inject 0.75 mg into the skin once a week.       I have changed [name]'s LEVEMIR FLEXTOUCH to Insulin Detemir and TRULICITY to Dulaglutide. I am also having her maintain her fish oil-omega-3 fatty acids, Lancets, JOBST RELIEF 30-40MMHG XL, fluocinonide-emollient, glucose blood, TRUE METRIX GO GLUCOSE METER, acetaminophen, ferrous sulfate, vitamin B-12, Multiple Vitamins-Minerals (HAIR SKIN AND NAILS FORMULA PO), diphenhydrAMINE, albuterol, econazole nitrate, lisinopril, potassium chloride SA, ipratropium-albuterol, torsemide, B-D ULTRAFINE III SHORT PEN, torsemide, apixaban, alendronate, diltiazem, levothyroxine, and metFORMIN.  Allergies as of 03/17/2018      Reactions   Lisinopril    R/t to kidney   Codeine Nausea And Vomiting      Medication List        Accurate as of 03/17/18 11:59 PM. Always use your most recent med list.          acetaminophen 500 MG tablet Commonly known as:  TYLENOL Take by mouth daily.   albuterol 108 (90 Base) MCG/ACT inhaler Commonly known as:  PROVENTIL HFA;VENTOLIN HFA Inhale 2 puffs into the lungs every 6 (six) hours as needed for wheezing or shortness of breath.   alendronate 70 MG tablet Commonly known as:  FOSAMAX Take 1 tablet (70 mg  total) by mouth every 7 (seven) days.   apixaban 5 MG Tabs tablet Commonly known as:  ELIQUIS Take 1 tablet (5 mg total) by mouth 2 (two) times daily.   B-D ULTRAFINE III SHORT PEN 31G X 8 MM Misc Generic drug:  Insulin Pen Needle Use to inject insulin twice daily as directed   diltiazem 180 MG 24 hr capsule Commonly known as:  CARDIZEM CD TAKE 1 CAPSULE (180 MG TOTAL) BY MOUTH DAILY.   diphenhydrAMINE 25 MG tablet Commonly known as:  SOMINEX Take 50 mg by mouth at bedtime as needed for sleep.   Dulaglutide 0.75 MG/0.5ML Sopn Commonly known as:  TRULICITY Inject 0.38 mg into the skin once a week.   econazole nitrate 1 % cream Apply topically daily.   ferrous sulfate 325 (65 FE) MG tablet Take 325 mg by mouth daily with breakfast.   fish oil-omega-3 fatty acids 1000 MG capsule Take 2 g by mouth daily.   fluocinonide-emollient 0.05 % cream Commonly known as:  LIDEX-E Apply to  feet after soaking each night   glucose blood test strip test sugars twice daily   HAIR SKIN AND NAILS FORMULA PO Take 1 tablet by mouth daily.   Insulin Detemir 100 UNIT/ML Pen Commonly known as:  LEVEMIR FLEXTOUCH INJECT 70 UNITS INTO THE SKIN DAILY WITH BREAKFAST.   ipratropium-albuterol 0.5-2.5 (3) MG/3ML Soln Commonly known as:  DUONEB INHALE 1 VIAL (3ML) EVERY 4 HOURS BY NEBULIZATION   JOBST RELIEF 30-40MMHG XL Misc Wear daily for swelling in legs   Lancets Misc   levothyroxine 50 MCG tablet Commonly known as:  SYNTHROID, LEVOTHROID Take 1 tablet (50 mcg total) by mouth daily.   lisinopril 5 MG tablet Commonly known as:  PRINIVIL,ZESTRIL Take 1 tablet (5 mg total) by mouth daily.   metFORMIN 750 MG 24 hr tablet Commonly known as:  GLUCOPHAGE-XR Take 1 tablet (750 mg total) by mouth daily with breakfast.   potassium chloride SA 20 MEQ tablet Commonly known as:  KLOR-CON M20 Take 1 tablet (20 mEq total) by mouth daily.   torsemide 100 MG tablet Commonly known as:   DEMADEX Take 1 tablet (100 mg total) by mouth daily. Take 2 tablets every morning and 1 tablet at noon.   torsemide 20 MG tablet Commonly known as:  DEMADEX TAKE 2 TABLETS BY MOUTH EVERY DAY   TRUE METRIX GO GLUCOSE METER w/Device Kit 1 strip by Does not apply route 2 (two) times daily.   vitamin B-12 50 MCG tablet Commonly known as:  CYANOCOBALAMIN Take 50 mcg by mouth daily.        Follow-up: Return in about 3 months (around 06/16/2018).  Claretta Fraise, M.D.

## 2018-03-30 ENCOUNTER — Other Ambulatory Visit: Payer: Self-pay | Admitting: Family Medicine

## 2018-03-31 ENCOUNTER — Ambulatory Visit: Payer: Medicare HMO | Admitting: Family Medicine

## 2018-04-27 ENCOUNTER — Other Ambulatory Visit: Payer: Self-pay | Admitting: Family Medicine

## 2018-04-27 DIAGNOSIS — I959 Hypotension, unspecified: Secondary | ICD-10-CM

## 2018-05-18 ENCOUNTER — Encounter: Payer: Self-pay | Admitting: Family Medicine

## 2018-05-18 ENCOUNTER — Ambulatory Visit (INDEPENDENT_AMBULATORY_CARE_PROVIDER_SITE_OTHER): Payer: Medicare HMO | Admitting: Family Medicine

## 2018-05-18 VITALS — BP 158/73 | HR 79 | Temp 97.3°F | Ht 65.0 in | Wt 272.6 lb

## 2018-05-18 DIAGNOSIS — S51012A Laceration without foreign body of left elbow, initial encounter: Secondary | ICD-10-CM

## 2018-05-18 MED ORDER — MUPIROCIN 2 % EX OINT: 1.0000 "application " | TOPICAL_OINTMENT | Freq: Two times a day (BID) | CUTANEOUS | 0 refills | Status: DC

## 2018-05-18 NOTE — Progress Notes (Signed)
   HPI  Patient presents today with a skin tear.  Patient explains that Friday, 4 days ago, her dog jumped in her lap and scratched her left arm causing to wounds.  Saturday she removed the Band-Aid and caused a skin tear on the left distal forearm.  Patient states that she thinks that she is healing well, she has been applying Neosporin ointment and keeping it bandaged.  She denies any fever, chills, sweats.   PMH: Smoking status noted ROS: Per HPI  Objective: BP (!) 158/73   Pulse 79   Temp (!) 97.3 F (36.3 C) (Oral)   Ht 5\' 5"  (1.651 m)   Wt 272 lb 9.6 oz (123.7 kg)   BMI 45.36 kg/m  Gen: NAD, alert, cooperative with exam HEENT: NCAT CV: RRR, good S1/S2, no murmur Resp: CTABL, no wheezes, non-labored Ext: No edema, warm Neuro: Alert and oriented, No gross deficits Skin L distal forearm with 2.7 cm X 0.4 cm shallow skin tear Proximal forearm with heme crusted 1.2 X 0.7 cm lesion   Assessment and plan:  #Skin tear After a dog scratch, today in clinic there is no signs of infection We have applied mupirocin ointment and discussed routine wound care emphasizing avoiding adhesives. Return to clinic with any concerns  mupirocin ointment 1-2 times daily.    Meds ordered this encounter  Medications  . mupirocin ointment (BACTROBAN) 2 %    Sig: Place 1 application into the nose 2 (two) times daily.    Dispense:  22 g    Refill:  [place], MD [facility] 05/18/2018, 2:05 PM

## 2018-05-28 ENCOUNTER — Encounter: Payer: Self-pay | Admitting: Family Medicine

## 2018-05-28 ENCOUNTER — Ambulatory Visit (INDEPENDENT_AMBULATORY_CARE_PROVIDER_SITE_OTHER): Payer: Medicare HMO | Admitting: Family Medicine

## 2018-05-28 VITALS — BP 139/72 | HR 74 | Temp 97.9°F | Ht 65.0 in | Wt 272.1 lb

## 2018-05-28 DIAGNOSIS — M810 Age-related osteoporosis without current pathological fracture: Secondary | ICD-10-CM | POA: Diagnosis not present

## 2018-05-28 DIAGNOSIS — Z794 Long term (current) use of insulin: Secondary | ICD-10-CM | POA: Diagnosis not present

## 2018-05-28 DIAGNOSIS — E119 Type 2 diabetes mellitus without complications: Secondary | ICD-10-CM | POA: Diagnosis not present

## 2018-05-28 LAB — URINALYSIS
BILIRUBIN UA: NEGATIVE
Glucose, UA: NEGATIVE
Ketones, UA: NEGATIVE
LEUKOCYTES UA: NEGATIVE
NITRITE UA: POSITIVE — AB
PH UA: 5 (ref 5.0–7.5)
Urobilinogen, Ur: 0.2 mg/dL (ref 0.2–1.0)

## 2018-05-28 LAB — BAYER DCA HB A1C WAIVED: HB A1C: 6.6 % (ref <7.0)

## 2018-05-28 NOTE — Progress Notes (Signed)
Subjective:  Patient ID: [name],  female    DOB: [birth year]  Age: 72 y.o.    CC: Medical Management of Chronic Issues   HPI [name] presents for  follow-up of hypertension. Patient has no history of headache chest pain or shortness of breath or recent cough. Patient also denies symptoms of TIA such as numbness weakness lateralizing. Patient denies side effects from medication. States taking it regularly.  Patient also  in for follow-up of elevated cholesterol. Doing well without complaints on current medication. Denies side effects  including myalgia and arthralgia and nausea. Also in today for liver function testing. Currently no chest pain, shortness of breath or other cardiovascular related symptoms noted.  Follow-up of diabetes. Patient does check blood sugar at home. Readings run between 80 and 180 Patient denies symptoms such as excessive hunger or urinary frequency, excessive hunger, nausea No significant hypoglycemic spells noted. Medications reviewed. Pt reports taking them regularly. Pt. denies complication/adverse reaction today.    Patient in for follow-up of atrial fibrillation. Patient denies any recent bouts of chest pain or palpitations. Additionally, patient is taking anticoagulant. Patient denies any recent excessive bleeding episodes including epistaxis, bleeding from the gums, genitalia, rectal bleeding or hematuria. Additionally there has been no excessive bruising. History [name] has a past medical history of Anemia, Arthritis, Asthma, Atrial fibrillation ([place]), CAD (coronary artery disease), Cataract, CHF (congestive heart failure) ([place]), COPD (chronic obstructive pulmonary disease) ([place]), Diabetes mellitus, Gait instability, GERD (gastroesophageal reflux disease), Hyperlipidemia, Hypertension, Obesity, Oxygen dependent, Sleep apnea, obstructive (2008), and Stroke [facility]).   She has a past surgical history that includes Cataract extraction w/  intraocular lens  implant, bilateral; Appendectomy; Cholecystectomy; Carpal tunnel release; Cervical spine surgery; Cesarean section; Umbilical hernia repair; Knee arthroscopy; Colonoscopy (11/22/2002); Upper gastrointestinal endoscopy (11/22/2002); Colonoscopy (11/26/2005); Femur IM nail (Left, 05/08/2013); Hip surgery (Left); and Eye surgery.   Her family history includes Alzheimer's disease in her father and mother; Asthma in her daughter and grandchild; Breast cancer in her sister; Cancer in her brother, mother, sister, and sister; Colon cancer in her mother; Diabetes in her daughter, mother, sister, and unknown relative; Heart attack in her mother, sister, and unknown relative; Heart disease in her brother, mother, and sister; Heart murmur in her brother; Kidney disease in her mother; Migraines in her sister.She reports that she has never smoked. She has never used smokeless tobacco. She reports that she does not drink alcohol or use drugs.  Current Outpatient Medications on File Prior to Visit  Medication Sig Dispense Refill  . acetaminophen (TYLENOL) 500 MG tablet Take by mouth daily.    Marland Kitchen albuterol (PROVENTIL HFA;VENTOLIN HFA) 108 (90 Base) MCG/ACT inhaler Inhale 2 puffs into the lungs every 6 (six) hours as needed for wheezing or shortness of breath. 1 Inhaler 0  . alendronate (FOSAMAX) 70 MG tablet Take 1 tablet (70 mg total) by mouth every 7 (seven) days. 4 tablet 2  . apixaban (ELIQUIS) 5 MG TABS tablet Take 1 tablet (5 mg total) by mouth 2 (two) times daily. 60 tablet 5  . B-D ULTRAFINE III SHORT PEN 31G X 8 MM MISC Use to inject insulin twice daily as directed 200 each 3  . Blood Glucose Monitoring Suppl (TRUE METRIX GO GLUCOSE METER) w/Device KIT 1 strip by Does not apply route 2 (two) times daily. 1 kit 5  . diltiazem (CARDIZEM CD) 180 MG 24 hr capsule TAKE 1 CAPSULE (180 MG TOTAL) BY MOUTH DAILY. 90 capsule 1  .  diphenhydrAMINE (SOMINEX) 25 MG tablet Take 50 mg by mouth at bedtime as  needed for sleep.    . Dulaglutide (TRULICITY) 6.59 DJ/5.7SV SOPN Inject 0.75 mg into the skin once a week. 4 mL 3  . econazole nitrate 1 % cream Apply topically daily. 15 g 0  . Elastic Bandages & Supports (JOBST RELIEF 30-40MMHG XL) MISC Wear daily for swelling in legs 2 each 11  . ferrous sulfate 325 (65 FE) MG tablet Take 325 mg by mouth daily with breakfast.    . fish oil-omega-3 fatty acids 1000 MG capsule Take 2 g by mouth daily.      . fluocinonide-emollient (LIDEX-E) 0.05 % cream Apply to feet after soaking each night 60 g 5  . glucose blood test strip test sugars twice daily 100 each 12  . Insulin Detemir (LEVEMIR FLEXTOUCH) 100 UNIT/ML Pen INJECT 70 UNITS INTO THE SKIN DAILY WITH BREAKFAST. 30 pen 3  . ipratropium-albuterol (DUONEB) 0.5-2.5 (3) MG/3ML SOLN INHALE 1 VIAL (3ML) EVERY 4 HOURS BY NEBULIZATION  0  . Lancets MISC     . levothyroxine (SYNTHROID, LEVOTHROID) 50 MCG tablet Take 1 tablet (50 mcg total) by mouth daily. 90 tablet 1  . lisinopril (PRINIVIL,ZESTRIL) 5 MG tablet TAKE 1 TABLET BY MOUTH EVERY DAY 90 tablet 1  . metFORMIN (GLUCOPHAGE-XR) 750 MG 24 hr tablet Take 1 tablet (750 mg total) by mouth daily with breakfast. 90 tablet 1  . Multiple Vitamins-Minerals (HAIR SKIN AND NAILS FORMULA PO) Take 1 tablet by mouth daily.    . mupirocin ointment (BACTROBAN) 2 % Place 1 application into the nose 2 (two) times daily. 22 g 0  . potassium chloride SA (KLOR-CON M20) 20 MEQ tablet Take 1 tablet (20 mEq total) by mouth daily. 90 tablet 3  . torsemide (DEMADEX) 20 MG tablet TAKE 2 TABLETS BY MOUTH EVERY DAY 60 tablet 5  . vitamin B-12 (CYANOCOBALAMIN) 50 MCG tablet Take 50 mcg by mouth daily.     No current facility-administered medications on file prior to visit.     ROS Review of Systems  Constitutional: Negative.   HENT: Negative for congestion.   Eyes: Negative for visual disturbance.  Respiratory: Negative for shortness of breath.   Cardiovascular: Negative for chest  pain.  Gastrointestinal: Negative for abdominal pain, constipation, diarrhea, nausea and vomiting.  Genitourinary: Negative for difficulty urinating.  Musculoskeletal: Negative for arthralgias and myalgias.  Neurological: Negative for headaches.  Psychiatric/Behavioral: Negative for sleep disturbance.    Objective:  BP 139/72   Pulse 74   Temp 97.9 F (36.6 C) (Oral)   Ht 5' 5"  (1.651 m)   Wt 272 lb 2 oz (123.4 kg)   BMI 45.28 kg/m   BP Readings from Last 3 Encounters:  05/28/18 139/72  05/18/18 (!) 158/73  03/17/18 118/61    Wt Readings from Last 3 Encounters:  05/28/18 272 lb 2 oz (123.4 kg)  05/18/18 272 lb 9.6 oz (123.7 kg)  03/17/18 279 lb 8 oz (126.8 kg)     Physical Exam  Constitutional: She is oriented to person, place, and time. She appears well-developed and well-nourished. No distress.  HENT:  Head: Normocephalic and atraumatic.  Right Ear: External ear normal.  Left Ear: External ear normal.  Nose: Nose normal.  Mouth/Throat: Oropharynx is clear and moist.  Eyes: Pupils are equal, round, and reactive to light. Conjunctivae and EOM are normal.  Neck: Normal range of motion. Neck supple. No thyromegaly present.  Cardiovascular: Normal rate, regular rhythm  and normal heart sounds.  No murmur heard. Pulmonary/Chest: Effort normal and breath sounds normal. No respiratory distress. She has no wheezes. She has no rales.  Abdominal: Soft. Bowel sounds are normal. She exhibits no distension. There is no tenderness.  Lymphadenopathy:    She has no cervical adenopathy.  Neurological: She is alert and oriented to person, place, and time. She has normal reflexes.  Skin: Skin is warm and dry.  Psychiatric: She has a normal mood and affect. Her behavior is normal. Judgment and thought content normal.    Results for orders placed or performed in visit on 05/28/18  Urinalysis  Result Value Ref Range   Specific Gravity, UA >1.030 (H) 1.005 - 1.030   pH, UA 5.0 5.0 -  7.5   Color, UA Yellow Yellow   Appearance Ur Clear Clear   Leukocytes, UA Negative Negative   Protein, UA 2+ (A) Negative/Trace   Glucose, UA Negative Negative   Ketones, UA Negative Negative   RBC, UA Trace (A) Negative   Bilirubin, UA Negative Negative   Urobilinogen, Ur 0.2 0.2 - 1.0 mg/dL   Nitrite, UA Positive (A) Negative  Bayer DCA Hb A1c Waived  Result Value Ref Range   HB A1C (BAYER DCA - WAIVED) 6.6 <7.0 %      Assessment & Plan:   Easter was seen today for medical management of chronic issues.  Diagnoses and all orders for this visit:  Long-term current use of insulin for diabetes mellitus ([place]) -     CMP14+EGFR -     Microalbumin / creatinine urine ratio -     Urinalysis -     Bayer DCA Hb A1c Waived  Osteoporosis, unspecified osteoporosis type, unspecified pathological fracture presence -     VITAMIN D 25 Hydroxy (Vit-D Deficiency, Fractures)   I am having Samah L. Andrada maintain her fish oil-omega-3 fatty acids, Lancets, JOBST RELIEF 30-40MMHG XL, fluocinonide-emollient, glucose blood, TRUE METRIX GO GLUCOSE METER, acetaminophen, ferrous sulfate, vitamin B-12, Multiple Vitamins-Minerals (HAIR SKIN AND NAILS FORMULA PO), diphenhydrAMINE, albuterol, econazole nitrate, potassium chloride SA, ipratropium-albuterol, B-D ULTRAFINE III SHORT PEN, apixaban, alendronate, diltiazem, Insulin Detemir, levothyroxine, metFORMIN, Dulaglutide, torsemide, lisinopril, and mupirocin ointment.  No orders of the defined types were placed in this encounter.    Follow-up: No follow-ups on file.  Claretta Fraise, M.D.

## 2018-05-28 NOTE — Patient Instructions (Signed)

## 2018-05-29 LAB — CMP14+EGFR
A/G RATIO: 1.5 (ref 1.2–2.2)
ALT: 14 IU/L (ref 0–32)
AST: 17 IU/L (ref 0–40)
Albumin: 4 g/dL (ref 3.5–4.8)
Alkaline Phosphatase: 184 IU/L — ABNORMAL HIGH (ref 39–117)
BILIRUBIN TOTAL: 0.7 mg/dL (ref 0.0–1.2)
BUN/Creatinine Ratio: 17 (ref 12–28)
BUN: 18 mg/dL (ref 8–27)
CO2: 21 mmol/L (ref 20–29)
Calcium: 9.1 mg/dL (ref 8.7–10.3)
Chloride: 107 mmol/L — ABNORMAL HIGH (ref 96–106)
Creatinine, Ser: 1.05 mg/dL — ABNORMAL HIGH (ref 0.57–1.00)
GFR calc Af Amer: 60 mL/min/{1.73_m2} (ref >59)
GFR calc non Af Amer: 52 mL/min/{1.73_m2} — ABNORMAL LOW (ref >59)
Globulin, Total: 2.7 g/dL (ref 1.5–4.5)
Glucose: 111 mg/dL — ABNORMAL HIGH (ref 65–99)
POTASSIUM: 4.3 mmol/L (ref 3.5–5.2)
Sodium: 142 mmol/L (ref 134–144)
Total Protein: 6.7 g/dL (ref 6.0–8.5)

## 2018-05-29 LAB — MICROALBUMIN / CREATININE URINE RATIO
Creatinine, Urine: 160 mg/dL
MICROALB/CREAT RATIO: 140.1 mg/g{creat} — AB (ref 0.0–30.0)
MICROALBUM., U, RANDOM: 224.1 ug/mL

## 2018-05-29 LAB — VITAMIN D 25 HYDROXY (VIT D DEFICIENCY, FRACTURES): Vit D, 25-Hydroxy: 29.7 ng/mL — ABNORMAL LOW (ref 30.0–100.0)

## 2018-05-31 ENCOUNTER — Other Ambulatory Visit: Payer: Self-pay | Admitting: Family Medicine

## 2018-05-31 ENCOUNTER — Other Ambulatory Visit: Payer: Self-pay | Admitting: *Deleted

## 2018-05-31 DIAGNOSIS — I959 Hypotension, unspecified: Secondary | ICD-10-CM

## 2018-05-31 DIAGNOSIS — R399 Unspecified symptoms and signs involving the genitourinary system: Secondary | ICD-10-CM

## 2018-05-31 DIAGNOSIS — N3090 Cystitis, unspecified without hematuria: Secondary | ICD-10-CM

## 2018-05-31 MED ORDER — LISINOPRIL 10 MG PO TABS: 10.0000 mg | ORAL_TABLET | Freq: Every day | ORAL | 1 refills | Status: DC

## 2018-06-04 ENCOUNTER — Other Ambulatory Visit: Payer: Self-pay | Admitting: Family Medicine

## 2018-06-04 DIAGNOSIS — Z7901 Long term (current) use of anticoagulants: Secondary | ICD-10-CM

## 2018-06-04 DIAGNOSIS — I482 Chronic atrial fibrillation, unspecified: Secondary | ICD-10-CM

## 2018-07-05 ENCOUNTER — Encounter: Payer: Self-pay | Admitting: Family Medicine

## 2018-07-05 ENCOUNTER — Ambulatory Visit (INDEPENDENT_AMBULATORY_CARE_PROVIDER_SITE_OTHER): Payer: Medicare HMO | Admitting: Family Medicine

## 2018-07-05 VITALS — BP 138/76 | HR 78 | Temp 97.0°F | Ht 65.0 in | Wt 269.2 lb

## 2018-07-05 DIAGNOSIS — I959 Hypotension, unspecified: Secondary | ICD-10-CM

## 2018-07-05 DIAGNOSIS — R801 Persistent proteinuria, unspecified: Secondary | ICD-10-CM | POA: Diagnosis not present

## 2018-07-05 DIAGNOSIS — E1121 Type 2 diabetes mellitus with diabetic nephropathy: Secondary | ICD-10-CM | POA: Diagnosis not present

## 2018-07-05 MED ORDER — LOSARTAN POTASSIUM 50 MG PO TABS: 50.0000 mg | ORAL_TABLET | Freq: Every day | ORAL | 3 refills | Status: DC

## 2018-07-05 NOTE — Progress Notes (Signed)
Subjective:  Patient ID: [name], female    DOB: 06/23/1946  Age: 72 y.o. MRN: [id number]  CC: Results (pt here today to discuss test results and have repeat labs drawn)   HPI [name] presents for recent lab test result.  She could not be reached by phone and a letter was sent asking her to make contact with the office.  She has taken lisinopril in the past for blood pressure and apparently had a reaction to that which was listed as a kidney problem.  Unfortunately at this time she has developed proteinuria and elevated microalbumin.  Because of this I would like to give her another trial on an ARB  this time.  She should drop by for a repeat of her BMP after she is been on it for a week or 2.  Depression screen [facility] 2/9 07/05/2018 05/28/2018 05/18/2018  Decreased Interest 0 0 0  Down, Depressed, Hopeless 0 0 0  PHQ - 2 Score 0 0 0  Altered sleeping - - -  Tired, decreased energy - - -  Change in appetite - - -  Feeling bad or failure about yourself  - - -  Trouble concentrating - - -  Moving slowly or fidgety/restless - - -  Suicidal thoughts - - -  PHQ-9 Score - - -  Difficult doing work/chores - - -  Some recent data might be hidden    History [name] has a past medical history of Anemia, Arthritis, Asthma, Atrial fibrillation ([place]), CAD (coronary artery disease), Cataract, CHF (congestive heart failure) ([place]), COPD (chronic obstructive pulmonary disease) ([place]), Diabetes mellitus, Gait instability, GERD (gastroesophageal reflux disease), Hyperlipidemia, Hypertension, Obesity, Oxygen dependent, Sleep apnea, obstructive (2008), and Stroke ([place]).   She has a past surgical history that includes Cataract extraction w/ intraocular lens  implant, bilateral; Appendectomy; Cholecystectomy; Carpal tunnel release; Cervical spine surgery; Cesarean section; Umbilical hernia repair; Knee arthroscopy; Colonoscopy (11/22/2002); Upper gastrointestinal endoscopy (11/22/2002); Colonoscopy  (11/26/2005); Femur IM nail (Left, 05/08/2013); Hip surgery (Left); and Eye surgery.   Her family history includes Alzheimer's disease in her father and mother; Asthma in her daughter and grandchild; Breast cancer in her sister; Cancer in her brother, mother, sister, and sister; Colon cancer in her mother; Diabetes in her daughter, mother, sister, and unknown relative; Heart attack in her mother, sister, and unknown relative; Heart disease in her brother, mother, and sister; Heart murmur in her brother; Kidney disease in her mother; Migraines in her sister.She reports that she has never smoked. She has never used smokeless tobacco. She reports that she does not drink alcohol or use drugs.    ROS Review of Systems Noncontributory Objective:  BP 138/76   Pulse 78   Temp (!) 97 F (36.1 C) (Oral)   Ht 5' 5"  (1.651 m)   Wt 269 lb 4 oz (122.1 kg)   BMI 44.81 kg/m   BP Readings from Last 3 Encounters:  07/05/18 138/76  05/28/18 139/72  05/18/18 (!) 158/73    Wt Readings from Last 3 Encounters:  07/05/18 269 lb 4 oz (122.1 kg)  05/28/18 272 lb 2 oz (123.4 kg)  05/18/18 272 lb 9.6 oz (123.7 kg)     Physical Exam  Constitutional: She is oriented to person, place, and time. She appears well-developed and well-nourished. No distress.  HENT:  Head: Normocephalic.  Eyes: EOM are normal.  Neck: Normal range of motion.  Pulmonary/Chest: Effort normal.  Neurological: She is alert and oriented to person,  place, and time.  Skin: Skin is warm and dry. No pallor.  Psychiatric: She has a normal mood and affect.  Vitals reviewed.     Assessment & Plan:   [name] was seen today for results.  Diagnoses and all orders for this visit:  Persistent proteinuria  Hypotension, unspecified hypotension type  Diabetic nephropathy associated with type 2 diabetes mellitus ([place])  Other orders -     losartan (COZAAR) 50 MG tablet; Take 1 tablet (50 mg total) by mouth daily.       I have  discontinued [name]'s lisinopril. I am also having her start on losartan. Additionally, I am having her maintain her fish oil-omega-3 fatty acids, Lancets, JOBST RELIEF 30-40MMHG XL, fluocinonide-emollient, glucose blood, TRUE METRIX GO GLUCOSE METER, acetaminophen, ferrous sulfate, vitamin B-12, Multiple Vitamins-Minerals (HAIR SKIN AND NAILS FORMULA PO), diphenhydrAMINE, albuterol, econazole nitrate, potassium chloride SA, ipratropium-albuterol, B-D ULTRAFINE III SHORT PEN, alendronate, diltiazem, Insulin Detemir, levothyroxine, metFORMIN, Dulaglutide, torsemide, mupirocin ointment, and ELIQUIS.  Allergies as of 07/05/2018      Reactions   Lisinopril    R/t to kidney   Codeine Nausea And Vomiting      Medication List        Accurate as of 07/05/18  8:16 PM. Always use your most recent med list.          acetaminophen 500 MG tablet Commonly known as:  TYLENOL Take by mouth daily.   albuterol 108 (90 Base) MCG/ACT inhaler Commonly known as:  PROVENTIL HFA;VENTOLIN HFA Inhale 2 puffs into the lungs every 6 (six) hours as needed for wheezing or shortness of breath.   alendronate 70 MG tablet Commonly known as:  FOSAMAX Take 1 tablet (70 mg total) by mouth every 7 (seven) days.   B-D ULTRAFINE III SHORT PEN 31G X 8 MM Misc Generic drug:  Insulin Pen Needle Use to inject insulin twice daily as directed   diltiazem 180 MG 24 hr capsule Commonly known as:  CARDIZEM CD TAKE 1 CAPSULE (180 MG TOTAL) BY MOUTH DAILY.   diphenhydrAMINE 25 MG tablet Commonly known as:  SOMINEX Take 50 mg by mouth at bedtime as needed for sleep.   Dulaglutide 0.75 MG/0.5ML Sopn Commonly known as:  TRULICITY Inject 0.35 mg into the skin once a week.   econazole nitrate 1 % cream Apply topically daily.   ELIQUIS 5 MG Tabs tablet Generic drug:  apixaban TAKE 1 TABLET BY MOUTH TWICE A DAY   ferrous sulfate 325 (65 FE) MG tablet Take 325 mg by mouth daily with breakfast.   fish  oil-omega-3 fatty acids 1000 MG capsule Take 2 g by mouth daily.   fluocinonide-emollient 0.05 % cream Commonly known as:  LIDEX-E Apply to feet after soaking each night   glucose blood test strip test sugars twice daily   HAIR SKIN AND NAILS FORMULA PO Take 1 tablet by mouth daily.   Insulin Detemir 100 UNIT/ML Pen Commonly known as:  LEVEMIR FLEXTOUCH INJECT 70 UNITS INTO THE SKIN DAILY WITH BREAKFAST.   ipratropium-albuterol 0.5-2.5 (3) MG/3ML Soln Commonly known as:  DUONEB INHALE 1 VIAL (3ML) EVERY 4 HOURS BY NEBULIZATION   JOBST RELIEF 30-40MMHG XL Misc Wear daily for swelling in legs   Lancets Misc   levothyroxine 50 MCG tablet Commonly known as:  SYNTHROID, LEVOTHROID Take 1 tablet (50 mcg total) by mouth daily.   losartan 50 MG tablet Commonly known as:  COZAAR Take 1 tablet (50 mg total) by mouth daily.  metFORMIN 750 MG 24 hr tablet Commonly known as:  GLUCOPHAGE-XR Take 1 tablet (750 mg total) by mouth daily with breakfast.   mupirocin ointment 2 % Commonly known as:  BACTROBAN Place 1 application into the nose 2 (two) times daily.   potassium chloride SA 20 MEQ tablet Commonly known as:  KLOR-CON M20 Take 1 tablet (20 mEq total) by mouth daily.   torsemide 20 MG tablet Commonly known as:  DEMADEX TAKE 2 TABLETS BY MOUTH EVERY DAY   TRUE METRIX GO GLUCOSE METER w/Device Kit 1 strip by Does not apply route 2 (two) times daily.   vitamin B-12 50 MCG tablet Commonly known as:  CYANOCOBALAMIN Take 50 mcg by mouth daily.        Follow-up: No follow-ups on file.  Claretta Fraise, M.D.

## 2018-07-29 ENCOUNTER — Other Ambulatory Visit: Payer: Medicare HMO

## 2018-07-29 ENCOUNTER — Other Ambulatory Visit: Payer: Self-pay | Admitting: *Deleted

## 2018-07-29 ENCOUNTER — Other Ambulatory Visit: Payer: Self-pay | Admitting: Family Medicine

## 2018-07-29 DIAGNOSIS — E1140 Type 2 diabetes mellitus with diabetic neuropathy, unspecified: Secondary | ICD-10-CM

## 2018-07-30 ENCOUNTER — Other Ambulatory Visit: Payer: Self-pay | Admitting: Family Medicine

## 2018-07-30 LAB — BMP8+EGFR
BUN/Creatinine Ratio: 18 (ref 12–28)
BUN: 22 mg/dL (ref 8–27)
CALCIUM: 9 mg/dL (ref 8.7–10.3)
CO2: 21 mmol/L (ref 20–29)
CREATININE: 1.23 mg/dL — AB (ref 0.57–1.00)
Chloride: 107 mmol/L — ABNORMAL HIGH (ref 96–106)
GFR calc Af Amer: 50 mL/min/{1.73_m2} — ABNORMAL LOW (ref >59)
GFR, EST NON AFRICAN AMERICAN: 43 mL/min/{1.73_m2} — AB (ref >59)
Glucose: 176 mg/dL — ABNORMAL HIGH (ref 65–99)
POTASSIUM: 4.6 mmol/L (ref 3.5–5.2)
Sodium: 141 mmol/L (ref 134–144)

## 2018-08-18 ENCOUNTER — Other Ambulatory Visit: Payer: Self-pay | Admitting: Family Medicine

## 2018-08-24 ENCOUNTER — Encounter: Payer: Self-pay | Admitting: Family Medicine

## 2018-08-24 ENCOUNTER — Ambulatory Visit (INDEPENDENT_AMBULATORY_CARE_PROVIDER_SITE_OTHER): Payer: Medicare HMO | Admitting: Family Medicine

## 2018-08-24 VITALS — BP 152/72 | HR 79 | Temp 97.0°F | Ht 65.0 in | Wt 283.5 lb

## 2018-08-24 DIAGNOSIS — N938 Other specified abnormal uterine and vaginal bleeding: Secondary | ICD-10-CM

## 2018-08-24 NOTE — Progress Notes (Signed)
Subjective:  Patient ID: [name], female    DOB: 1946/03/20  Age: 72 y.o. MRN: [id number]  CC: Vaginal Bleeding (pt here today c/o vaginal bleeding since yesterday )   HPI [name] presents for sudden onset of vaginal bleeding yesterday. Felt need to go to bathroom, passed multiple clots, bowl was "full." No pain noted. Pt. Had similar sx with uterine polyp in past that had to be removed. Still actively bleeding. Used multiple pads overnight. Denies pain. No recent vaginal DC. [name] is not sexally active. [name] does take eliquis to thin [name] blood due to dx of atrial fibrillation.  Depression screen [facility] 2/9 07/05/2018 05/28/2018 05/18/2018  Decreased Interest 0 0 0  Down, Depressed, Hopeless 0 0 0  PHQ - 2 Score 0 0 0  Altered sleeping - - -  Tired, decreased energy - - -  Change in appetite - - -  Feeling bad or failure about yourself  - - -  Trouble concentrating - - -  Moving slowly or fidgety/restless - - -  Suicidal thoughts - - -  PHQ-9 Score - - -  Difficult doing work/chores - - -  Some recent data might be hidden    History [name] has a past medical history of Anemia, Arthritis, Asthma, Atrial fibrillation ([place]), CAD (coronary artery disease), Cataract, CHF (congestive heart failure) ([place]), COPD (chronic obstructive pulmonary disease) ([place]), Diabetes mellitus, Gait instability, GERD (gastroesophageal reflux disease), Hyperlipidemia, Hypertension, Obesity, Oxygen dependent, Sleep apnea, obstructive (2008), and Stroke ([place]).   [name] has a past surgical history that includes Cataract extraction w/ intraocular lens  implant, bilateral; Appendectomy; Cholecystectomy; Carpal tunnel release; Cervical spine surgery; Cesarean section; Umbilical hernia repair; Knee arthroscopy; Colonoscopy (11/22/2002); Upper gastrointestinal endoscopy (11/22/2002); Colonoscopy (11/26/2005); Femur IM nail (Left, 05/08/2013); Hip surgery (Left); and Eye surgery.   [name] family history includes  Alzheimer's disease in [name] father and mother; Asthma in [name] daughter and grandchild; Breast cancer in [name] sister; Cancer in [name] brother, mother, sister, and sister; Colon cancer in [name] mother; Diabetes in [name] daughter, mother, sister, and unknown relative; Heart attack in [name] mother, sister, and unknown relative; Heart disease in [name] brother, mother, and sister; Heart murmur in [name] brother; Kidney disease in [name] mother; Migraines in [name] sister.[name] reports that [name] has never smoked. [name] has never used smokeless tobacco. [name] reports that [name] does not drink alcohol or use drugs.    ROS Review of Systems  Constitutional: Negative.   HENT: Negative.   Eyes: Negative for visual disturbance.  Respiratory: Negative for shortness of breath.   Cardiovascular: Negative for chest pain.  Gastrointestinal: Negative for abdominal pain.  Genitourinary: Positive for menstrual problem and vaginal bleeding. Negative for difficulty urinating, dysuria, frequency, pelvic pain, vaginal discharge and vaginal pain.  Musculoskeletal: Negative for arthralgias.    Objective:  BP (!) 152/72   Pulse 79   Temp (!) 97 F (36.1 C) (Oral)   Ht 5' 5"  (1.651 m)   Wt 283 lb 8 oz (128.6 kg)   BMI 47.18 kg/m   BP Readings from Last 3 Encounters:  08/24/18 (!) 152/72  07/05/18 138/76  05/28/18 139/72    Wt Readings from Last 3 Encounters:  08/24/18 283 lb 8 oz (128.6 kg)  07/05/18 269 lb 4 oz (122.1 kg)  05/28/18 272 lb 2 oz (123.4 kg)     Physical Exam  Constitutional: [name] is oriented to person, place, and time. [name] appears well-developed and well-nourished. No distress.  HENT:  Head: Normocephalic and atraumatic.  Eyes: Pupils are equal, round, and reactive to light. Conjunctivae are normal.  Neck: Normal range of motion. Neck supple. No thyromegaly present.  Cardiovascular: Normal rate, regular rhythm and normal heart sounds.  No murmur heard. Pulmonary/Chest: Effort normal and breath sounds normal. No  respiratory distress. [name] has no wheezes. [name] has no rales.  Abdominal: Soft. Bowel sounds are normal. [name] exhibits no distension. There is no tenderness.  Genitourinary: Pelvic exam was performed with patient supine. There is no rash or lesion on the right labia. There is no rash or lesion on the left labia. Cervix exhibits discharge (bloody). Right adnexum displays no mass and no tenderness. Left adnexum displays no mass and no tenderness. There is bleeding in the vagina.  Musculoskeletal: Normal range of motion.  Lymphadenopathy:    [name] has no cervical adenopathy.  Neurological: [name] is alert and oriented to person, place, and time.  Skin: Skin is warm and dry.  Psychiatric: [name] has a normal mood and affect. [name] behavior is normal. Judgment and thought content normal.      Assessment & Plan:   [name] was seen today for vaginal bleeding.  Diagnoses and all orders for this visit:  DUB (dysfunctional uterine bleeding) -     CBC with Differential/Platelet -     Ambulatory referral to Obstetrics / Gynecology  Other orders -     Cancel: Urine Culture -     Cancel: Urinalysis       I am having [name] maintain [name] fish oil-omega-3 fatty acids, Lancets, JOBST RELIEF 30-40MMHG XL, fluocinonide-emollient, glucose blood, TRUE METRIX GO GLUCOSE METER, acetaminophen, ferrous sulfate, vitamin B-12, Multiple Vitamins-Minerals (HAIR SKIN AND NAILS FORMULA PO), diphenhydrAMINE, albuterol, econazole nitrate, ipratropium-albuterol, B-D ULTRAFINE III SHORT PEN, alendronate, diltiazem, Insulin Detemir, levothyroxine, metFORMIN, mupirocin ointment, ELIQUIS, losartan, torsemide, TRULICITY, and KLOR-CON M20.  Allergies as of 08/24/2018      Reactions   Lisinopril    R/t to kidney   Codeine Nausea And Vomiting      Medication List        Accurate as of 08/24/18  9:19 PM. Always use your most recent med list.          acetaminophen 500 MG tablet Commonly known as:  TYLENOL Take by  mouth daily.   albuterol 108 (90 Base) MCG/ACT inhaler Commonly known as:  PROVENTIL HFA;VENTOLIN HFA Inhale 2 puffs into the lungs every 6 (six) hours as needed for wheezing or shortness of breath.   alendronate 70 MG tablet Commonly known as:  FOSAMAX Take 1 tablet (70 mg total) by mouth every 7 (seven) days.   B-D ULTRAFINE III SHORT PEN 31G X 8 MM Misc Generic drug:  Insulin Pen Needle Use to inject insulin twice daily as directed   diltiazem 180 MG 24 hr capsule Commonly known as:  CARDIZEM CD TAKE 1 CAPSULE (180 MG TOTAL) BY MOUTH DAILY.   diphenhydrAMINE 25 MG tablet Commonly known as:  SOMINEX Take 50 mg by mouth at bedtime as needed for sleep.   econazole nitrate 1 % cream Apply topically daily.   ELIQUIS 5 MG Tabs tablet Generic drug:  apixaban TAKE 1 TABLET BY MOUTH TWICE A DAY   ferrous sulfate 325 (65 FE) MG tablet Take 325 mg by mouth daily with breakfast.   fish oil-omega-3 fatty acids 1000 MG capsule Take 2 g by mouth daily.   fluocinonide-emollient 0.05 % cream Commonly known as:  LIDEX-E Apply to feet after  soaking each night   glucose blood test strip test sugars twice daily   HAIR SKIN AND NAILS FORMULA PO Take 1 tablet by mouth daily.   Insulin Detemir 100 UNIT/ML Pen Commonly known as:  LEVEMIR INJECT 70 UNITS INTO THE SKIN DAILY WITH BREAKFAST.   ipratropium-albuterol 0.5-2.5 (3) MG/3ML Soln Commonly known as:  DUONEB INHALE 1 VIAL (3ML) EVERY 4 HOURS BY NEBULIZATION   JOBST RELIEF 30-40MMHG XL Misc Wear daily for swelling in legs   KLOR-CON M20 20 MEQ tablet Generic drug:  potassium chloride SA TAKE 1 TABLET BY MOUTH EVERY DAY   Lancets Misc   levothyroxine 50 MCG tablet Commonly known as:  SYNTHROID, LEVOTHROID Take 1 tablet (50 mcg total) by mouth daily.   losartan 50 MG tablet Commonly known as:  COZAAR Take 1 tablet (50 mg total) by mouth daily.   metFORMIN 750 MG 24 hr tablet Commonly known as:  GLUCOPHAGE-XR Take 1  tablet (750 mg total) by mouth daily with breakfast.   mupirocin ointment 2 % Commonly known as:  BACTROBAN Place 1 application into the nose 2 (two) times daily.   torsemide 20 MG tablet Commonly known as:  DEMADEX TAKE 2 TABLETS BY MOUTH EVERY DAY   TRUE METRIX GO GLUCOSE METER w/Device Kit 1 strip by Does not apply route 2 (two) times daily.   TRULICITY 1.43 OO/8.7NZ Sopn Generic drug:  Dulaglutide INJECT 0.75 MG INTO THE SKIN ONCE A WEEK.   vitamin B-12 50 MCG tablet Commonly known as:  CYANOCOBALAMIN Take 50 mcg by mouth daily.        Follow-up: Return if symptoms worsen or fail to improve.  Claretta Fraise, M.D.

## 2018-08-25 LAB — CBC WITH DIFFERENTIAL/PLATELET
BASOS: 0 %
Basophils Absolute: 0 10*3/uL (ref 0.0–0.2)
EOS (ABSOLUTE): 0.1 10*3/uL (ref 0.0–0.4)
Eos: 2 %
Hematocrit: 30.9 % — ABNORMAL LOW (ref 34.0–46.6)
Hemoglobin: 10 g/dL — ABNORMAL LOW (ref 11.1–15.9)
IMMATURE GRANULOCYTES: 1 %
Immature Grans (Abs): 0 10*3/uL (ref 0.0–0.1)
Lymphocytes Absolute: 0.9 10*3/uL (ref 0.7–3.1)
Lymphs: 13 %
MCH: 31.3 pg (ref 26.6–33.0)
MCHC: 32.4 g/dL (ref 31.5–35.7)
MCV: 97 fL (ref 79–97)
Monocytes Absolute: 0.4 10*3/uL (ref 0.1–0.9)
Monocytes: 6 %
NEUTROS PCT: 78 %
Neutrophils Absolute: 5.1 10*3/uL (ref 1.4–7.0)
PLATELETS: 137 10*3/uL — AB (ref 150–450)
RBC: 3.2 x10E6/uL — ABNORMAL LOW (ref 3.77–5.28)
RDW: 15.9 % — AB (ref 12.3–15.4)
WBC: 6.5 10*3/uL (ref 3.4–10.8)

## 2018-08-30 ENCOUNTER — Ambulatory Visit: Payer: Medicare HMO | Admitting: Family Medicine

## 2018-09-08 ENCOUNTER — Other Ambulatory Visit: Payer: Self-pay | Admitting: Family Medicine

## 2018-09-08 ENCOUNTER — Ambulatory Visit (INDEPENDENT_AMBULATORY_CARE_PROVIDER_SITE_OTHER): Payer: Medicare HMO | Admitting: Family Medicine

## 2018-09-08 ENCOUNTER — Encounter: Payer: Self-pay | Admitting: Family Medicine

## 2018-09-08 VITALS — BP 137/74 | HR 75 | Temp 97.2°F | Ht 65.0 in

## 2018-09-08 DIAGNOSIS — I959 Hypotension, unspecified: Secondary | ICD-10-CM | POA: Diagnosis not present

## 2018-09-08 DIAGNOSIS — I482 Chronic atrial fibrillation, unspecified: Secondary | ICD-10-CM

## 2018-09-08 DIAGNOSIS — Z23 Encounter for immunization: Secondary | ICD-10-CM

## 2018-09-08 DIAGNOSIS — E1140 Type 2 diabetes mellitus with diabetic neuropathy, unspecified: Secondary | ICD-10-CM | POA: Diagnosis not present

## 2018-09-08 DIAGNOSIS — E782 Mixed hyperlipidemia: Secondary | ICD-10-CM

## 2018-09-08 DIAGNOSIS — Z7901 Long term (current) use of anticoagulants: Secondary | ICD-10-CM

## 2018-09-08 LAB — BAYER DCA HB A1C WAIVED: HB A1C (BAYER DCA - WAIVED): 6.9 % (ref <7.0)

## 2018-09-08 MED ORDER — METFORMIN HCL ER 750 MG PO TB24: 750.0000 mg | ORAL_TABLET | Freq: Every day | ORAL | 1 refills | Status: DC

## 2018-09-08 MED ORDER — INSULIN DETEMIR 100 UNIT/ML FLEXPEN: PEN_INJECTOR | SUBCUTANEOUS | 3 refills | Status: DC

## 2018-09-08 MED ORDER — DILTIAZEM HCL ER COATED BEADS 180 MG PO CP24: ORAL_CAPSULE | ORAL | 1 refills | Status: DC

## 2018-09-08 MED ORDER — APIXABAN 5 MG PO TABS: 5.0000 mg | ORAL_TABLET | Freq: Two times a day (BID) | ORAL | 0 refills | Status: DC

## 2018-09-08 MED ORDER — DULAGLUTIDE 0.75 MG/0.5ML ~~LOC~~ SOAJ: SUBCUTANEOUS | 2 refills | Status: DC

## 2018-09-08 MED ORDER — LEVOTHYROXINE SODIUM 50 MCG PO TABS: 50.0000 ug | ORAL_TABLET | Freq: Every day | ORAL | 1 refills | Status: DC

## 2018-09-08 NOTE — Addendum Note (Signed)
Addended by: Shelbie Ammons on: 09/08/2018 05:09 PM   Modules accepted: Orders

## 2018-09-08 NOTE — Progress Notes (Signed)
Subjective:  Patient ID: [name],  female    DOB: 1946/05/22  Age: 72 y.o.    CC: Medical Management of Chronic Issues and Diabetes (three month recheck)   HPI [name] presents for  follow-up of hypertension. Patient has no history of headache chest pain or shortness of breath or recent cough. Patient also denies symptoms of TIA such as numbness weakness lateralizing. Patient denies side effects from medication. States taking it regularly.  Patient also  in for follow-up of elevated cholesterol. Doing well without complaints on current medication. Denies side effects  including myalgia and arthralgia and nausea. Also in today for liver function testing. Currently no chest pain, shortness of breath or other cardiovascular related symptoms noted.  Follow-up of diabetes. Patient does check blood sugar at home. Readings run between 100 and 150 Patient denies symptoms such as excessive hunger or urinary frequency, excessive hunger, nausea No significant hypoglycemic spells noted. Medications reviewed. Pt reports taking them regularly. Pt. denies complication/adverse reaction today.    History [name] has a past medical history of Anemia, Arthritis, Asthma, Atrial fibrillation ([place]), CAD (coronary artery disease), Cataract, CHF (congestive heart failure) ([place]), COPD (chronic obstructive pulmonary disease) ([place]), Diabetes mellitus, Gait instability, GERD (gastroesophageal reflux disease), Hyperlipidemia, Hypertension, Obesity, Oxygen dependent, Sleep apnea, obstructive (2008), and Stroke [facility]).   [name] has a past surgical history that includes Cataract extraction w/ intraocular lens  implant, bilateral; Appendectomy; Cholecystectomy; Carpal tunnel release; Cervical spine surgery; Cesarean section; Umbilical hernia repair; Knee arthroscopy; Colonoscopy (11/22/2002); Upper gastrointestinal endoscopy (11/22/2002); Colonoscopy (11/26/2005); Femur IM nail (Left, 05/08/2013); Hip surgery  (Left); and Eye surgery.   Her family history includes Alzheimer's disease in her father and mother; Asthma in her daughter and grandchild; Breast cancer in her sister; Cancer in her brother, mother, sister, and sister; Colon cancer in her mother; Diabetes in her daughter, mother, sister, and unknown relative; Heart attack in her mother, sister, and unknown relative; Heart disease in her brother, mother, and sister; Heart murmur in her brother; Kidney disease in her mother; Migraines in her sister.[name] reports that [name] has never smoked. [name] has never used smokeless tobacco. [name] reports that [name] does not drink alcohol or use drugs.  Current Outpatient Medications on File Prior to Visit  Medication Sig Dispense Refill  . acetaminophen (TYLENOL) 500 MG tablet Take by mouth daily.    Marland Kitchen albuterol (PROVENTIL HFA;VENTOLIN HFA) 108 (90 Base) MCG/ACT inhaler Inhale 2 puffs into the lungs every 6 (six) hours as needed for wheezing or shortness of breath. 1 Inhaler 0  . alendronate (FOSAMAX) 70 MG tablet Take 1 tablet (70 mg total) by mouth every 7 (seven) days. 4 tablet 2  . B-D ULTRAFINE III SHORT PEN 31G X 8 MM MISC Use to inject insulin twice daily as directed 200 each 3  . Blood Glucose Monitoring Suppl (TRUE METRIX GO GLUCOSE METER) w/Device KIT 1 strip by Does not apply route 2 (two) times daily. 1 kit 5  . diltiazem (CARDIZEM CD) 180 MG 24 hr capsule TAKE 1 CAPSULE (180 MG TOTAL) BY MOUTH DAILY. 90 capsule 1  . diphenhydrAMINE (SOMINEX) 25 MG tablet Take 50 mg by mouth at bedtime as needed for sleep.    Marland Kitchen econazole nitrate 1 % cream Apply topically daily. 15 g 0  . Elastic Bandages & Supports (JOBST RELIEF 30-40MMHG XL) MISC Wear daily for swelling in legs 2 each 11  . ELIQUIS 5 MG TABS tablet TAKE 1 TABLET BY MOUTH TWICE  A DAY 180 tablet 0  . ferrous sulfate 325 (65 FE) MG tablet Take 325 mg by mouth daily with breakfast.    . fish oil-omega-3 fatty acids 1000 MG capsule Take 2 g by mouth daily.        . fluocinonide-emollient (LIDEX-E) 0.05 % cream Apply to feet after soaking each night 60 g 5  . glucose blood test strip test sugars twice daily 100 each 12  . Insulin Detemir (LEVEMIR FLEXTOUCH) 100 UNIT/ML Pen INJECT 70 UNITS INTO THE SKIN DAILY WITH BREAKFAST. 30 pen 3  . ipratropium-albuterol (DUONEB) 0.5-2.5 (3) MG/3ML SOLN INHALE 1 VIAL (3ML) EVERY 4 HOURS BY NEBULIZATION  0  . KLOR-CON M20 20 MEQ tablet TAKE 1 TABLET BY MOUTH EVERY DAY 90 tablet 0  . Lancets MISC     . levothyroxine (SYNTHROID, LEVOTHROID) 50 MCG tablet Take 1 tablet (50 mcg total) by mouth daily. 90 tablet 1  . losartan (COZAAR) 50 MG tablet Take 1 tablet (50 mg total) by mouth daily. 90 tablet 3  . metFORMIN (GLUCOPHAGE-XR) 750 MG 24 hr tablet Take 1 tablet (750 mg total) by mouth daily with breakfast. 90 tablet 1  . Multiple Vitamins-Minerals (HAIR SKIN AND NAILS FORMULA PO) Take 1 tablet by mouth daily.    . mupirocin ointment (BACTROBAN) 2 % Place 1 application into the nose 2 (two) times daily. 22 g 0  . torsemide (DEMADEX) 20 MG tablet TAKE 2 TABLETS BY MOUTH EVERY DAY 180 tablet 1  . TRULICITY 4.65 KP/5.4SF SOPN INJECT 0.75 MG INTO THE SKIN ONCE A WEEK. 2 pen 2  . vitamin B-12 (CYANOCOBALAMIN) 50 MCG tablet Take 50 mcg by mouth daily.     No current facility-administered medications on file prior to visit.     ROS Review of Systems  Constitutional: Negative.   HENT: Negative for congestion.   Eyes: Negative for visual disturbance.  Respiratory: Positive for shortness of breath ([place]).   Cardiovascular: Positive for leg swelling. Negative for chest pain.  Gastrointestinal: Negative for abdominal pain, constipation, diarrhea, nausea and vomiting.  Genitourinary: Negative for difficulty urinating.  Musculoskeletal: Negative for arthralgias and myalgias.  Neurological: Negative for headaches.  Psychiatric/Behavioral: Negative for sleep disturbance.    Objective:  BP 137/74   Pulse 75   Temp (!) 97.2 F  (36.2 C) (Oral)   BP Readings from Last 3 Encounters:  09/08/18 137/74  08/24/18 (!) 152/72  07/05/18 138/76    Wt Readings from Last 3 Encounters:  08/24/18 283 lb 8 oz (128.6 kg)  07/05/18 269 lb 4 oz (122.1 kg)  05/28/18 272 lb 2 oz (123.4 kg)     Physical Exam  Constitutional: [name] is oriented to person, place, and time. [name] appears well-developed and well-nourished. No distress.  HENT:  Head: Normocephalic and atraumatic.  Right Ear: External ear normal.  Left Ear: External ear normal.  Nose: Nose normal.  Mouth/Throat: Oropharynx is clear and moist.  Eyes: Pupils are equal, round, and reactive to light. Conjunctivae and EOM are normal.  Neck: Normal range of motion. Neck supple. No thyromegaly present.  Cardiovascular: Normal rate, regular rhythm and normal heart sounds.  No murmur heard. Pulmonary/Chest: Effort normal and breath sounds normal. No respiratory distress. [name] has no wheezes. [name] has no rales.  Abdominal: Soft. Bowel sounds are normal. [name] exhibits no distension. There is no tenderness.  Musculoskeletal: [name] exhibits edema (2+ BLE at distal leg).  Lymphadenopathy:    [name] has no cervical adenopathy.  Neurological: [name]  is alert and oriented to person, place, and time. [name] has normal reflexes.  Skin: Skin is warm and dry.  Psychiatric: [name] has a normal mood and affect. Her behavior is normal. Judgment and thought content normal.    Diabetic Foot Exam - Simple   No data filed        Assessment & Plan:   [name] was seen today for medical management of chronic issues and diabetes.  Diagnoses and all orders for this visit:  Type 2 diabetes mellitus with diabetic neuropathy, unspecified whether long term insulin use ([place]) -     CBC with Differential/Platelet -     CMP14+EGFR -     Bayer DCA Hb A1c Waived  Hypotension, unspecified hypotension type -     CBC with Differential/Platelet -     CMP14+EGFR  Mixed hyperlipidemia -     Lipid panel   I  am having [name] maintain her fish oil-omega-3 fatty acids, Lancets, JOBST RELIEF 30-40MMHG XL, fluocinonide-emollient, glucose blood, TRUE METRIX GO GLUCOSE METER, acetaminophen, ferrous sulfate, vitamin B-12, Multiple Vitamins-Minerals (HAIR SKIN AND NAILS FORMULA PO), diphenhydrAMINE, albuterol, econazole nitrate, ipratropium-albuterol, B-D ULTRAFINE III SHORT PEN, alendronate, diltiazem, Insulin Detemir, levothyroxine, metFORMIN, mupirocin ointment, ELIQUIS, losartan, torsemide, TRULICITY, and KLOR-CON M20.  No orders of the defined types were placed in this encounter.    Follow-up: Return in about 3 months (around 12/09/2018).  Claretta Fraise, M.D.

## 2018-09-09 LAB — SPECIMEN STATUS REPORT

## 2018-09-10 LAB — CMP14+EGFR
A/G RATIO: 1.7 (ref 1.2–2.2)
ALK PHOS: 200 IU/L — AB (ref 39–117)
ALT: 15 IU/L (ref 0–32)
AST: 16 IU/L (ref 0–40)
Albumin: 4 g/dL (ref 3.5–4.8)
BUN / CREAT RATIO: 19 (ref 12–28)
BUN: 22 mg/dL (ref 8–27)
Bilirubin Total: 0.7 mg/dL (ref 0.0–1.2)
CALCIUM: 9 mg/dL (ref 8.7–10.3)
CHLORIDE: 109 mmol/L — AB (ref 96–106)
CO2: 19 mmol/L — ABNORMAL LOW (ref 20–29)
CREATININE: 1.14 mg/dL — AB (ref 0.57–1.00)
GFR calc Af Amer: 55 mL/min/{1.73_m2} — ABNORMAL LOW (ref >59)
GFR calc non Af Amer: 47 mL/min/{1.73_m2} — ABNORMAL LOW (ref >59)
GLOBULIN, TOTAL: 2.4 g/dL (ref 1.5–4.5)
Glucose: 193 mg/dL — ABNORMAL HIGH (ref 65–99)
Potassium: 4.3 mmol/L (ref 3.5–5.2)
SODIUM: 146 mmol/L — AB (ref 134–144)
Total Protein: 6.4 g/dL (ref 6.0–8.5)

## 2018-09-10 LAB — CBC WITH DIFFERENTIAL/PLATELET

## 2018-09-10 LAB — LIPID PANEL
Chol/HDL Ratio: 3 ratio (ref 0.0–4.4)
Cholesterol, Total: 157 mg/dL (ref 100–199)
HDL: 53 mg/dL (ref >39)
LDL Calculated: 88 mg/dL (ref 0–99)
TRIGLYCERIDES: 79 mg/dL (ref 0–149)
VLDL CHOLESTEROL CAL: 16 mg/dL (ref 5–40)

## 2018-09-13 ENCOUNTER — Encounter: Payer: Medicare HMO | Admitting: Obstetrics and Gynecology

## 2018-10-19 ENCOUNTER — Encounter: Payer: Self-pay | Admitting: *Deleted

## 2018-10-19 NOTE — Progress Notes (Signed)
Removed from anticoagulation track. Coumadin d/c and started on Eliquis.

## 2018-11-18 ENCOUNTER — Other Ambulatory Visit: Payer: Self-pay | Admitting: Family Medicine

## 2018-12-10 ENCOUNTER — Ambulatory Visit: Payer: Medicare HMO | Admitting: Family Medicine

## 2018-12-20 ENCOUNTER — Encounter: Payer: Self-pay | Admitting: Family Medicine

## 2018-12-20 ENCOUNTER — Ambulatory Visit (INDEPENDENT_AMBULATORY_CARE_PROVIDER_SITE_OTHER): Payer: Medicare HMO | Admitting: Family Medicine

## 2018-12-20 VITALS — BP 131/69 | HR 78 | Temp 97.9°F | Ht 65.0 in | Wt 296.1 lb

## 2018-12-20 DIAGNOSIS — Z7901 Long term (current) use of anticoagulants: Secondary | ICD-10-CM

## 2018-12-20 DIAGNOSIS — I482 Chronic atrial fibrillation, unspecified: Secondary | ICD-10-CM | POA: Diagnosis not present

## 2018-12-20 DIAGNOSIS — E782 Mixed hyperlipidemia: Secondary | ICD-10-CM | POA: Diagnosis not present

## 2018-12-20 DIAGNOSIS — E1140 Type 2 diabetes mellitus with diabetic neuropathy, unspecified: Secondary | ICD-10-CM

## 2018-12-20 DIAGNOSIS — E039 Hypothyroidism, unspecified: Secondary | ICD-10-CM

## 2018-12-20 LAB — BAYER DCA HB A1C WAIVED: HB A1C (BAYER DCA - WAIVED): 6.8 % (ref <7.0)

## 2018-12-20 MED ORDER — METFORMIN HCL ER 750 MG PO TB24: 750.0000 mg | ORAL_TABLET | Freq: Every day | ORAL | 1 refills | Status: DC

## 2018-12-20 MED ORDER — ALENDRONATE SODIUM 70 MG PO TABS: 70.0000 mg | ORAL_TABLET | ORAL | 5 refills | Status: DC

## 2018-12-20 MED ORDER — APIXABAN 5 MG PO TABS: 5.0000 mg | ORAL_TABLET | Freq: Two times a day (BID) | ORAL | 1 refills | Status: DC

## 2018-12-20 MED ORDER — LEVOTHYROXINE SODIUM 50 MCG PO TABS: 50.0000 ug | ORAL_TABLET | Freq: Every day | ORAL | 1 refills | Status: DC

## 2018-12-20 MED ORDER — GLUCOSE BLOOD VI STRP: ORAL_STRIP | 12 refills | Status: DC

## 2018-12-20 MED ORDER — DULAGLUTIDE 0.75 MG/0.5ML ~~LOC~~ SOAJ: SUBCUTANEOUS | 5 refills | Status: DC

## 2018-12-20 MED ORDER — DILTIAZEM HCL ER COATED BEADS 180 MG PO CP24: ORAL_CAPSULE | ORAL | 1 refills | Status: DC

## 2018-12-20 MED ORDER — LOSARTAN POTASSIUM 50 MG PO TABS: 50.0000 mg | ORAL_TABLET | Freq: Every day | ORAL | 3 refills | Status: DC

## 2018-12-20 MED ORDER — ALBUTEROL SULFATE HFA 108 (90 BASE) MCG/ACT IN AERS: 2.0000 | INHALATION_SPRAY | Freq: Four times a day (QID) | RESPIRATORY_TRACT | 5 refills | Status: DC | PRN

## 2018-12-20 MED ORDER — TRAZODONE HCL 150 MG PO TABS: ORAL_TABLET | ORAL | 5 refills | Status: DC

## 2018-12-20 MED ORDER — POTASSIUM CHLORIDE CRYS ER 20 MEQ PO TBCR: 20.0000 meq | EXTENDED_RELEASE_TABLET | Freq: Every day | ORAL | 1 refills | Status: DC

## 2018-12-20 MED ORDER — TORSEMIDE 20 MG PO TABS: 40.0000 mg | ORAL_TABLET | Freq: Every day | ORAL | 1 refills | Status: DC

## 2018-12-20 MED ORDER — INSULIN DETEMIR 100 UNIT/ML FLEXPEN: PEN_INJECTOR | SUBCUTANEOUS | 3 refills | Status: DC

## 2018-12-20 NOTE — Addendum Note (Signed)
Addended by: Claretta Fraise on: 12/20/2018 05:01 PM   Modules accepted: Orders

## 2018-12-20 NOTE — Progress Notes (Addendum)
Subjective:  Patient ID: [name], female    DOB: 1945-12-25  Age: 73 y.o. MRN: [id number]  CC: Medical Management of Chronic Issues   HPI [name] presents forFollow-up of diabetes. Patient checks blood sugar at home.   110 fasting and not checking postprandial Patient denies symptoms such as polyuria, polydipsia, excessive hunger, nausea No significant hypoglycemic spells noted. Medications reviewed. Pt reports taking them regularly without complication/adverse reaction being reported today.  Checking feet daily.Very swollen. Out of torsemide.  Patient presents for follow-up on  thyroid. The patient has a history of hypothyroidism for many years. It has been stable recently. Pt. denies any change in  voice, loss of hair, heat or cold intolerance. Energy level has been adequate to good. Patient denies constipation and diarrhea. No myxedema. Medication is as noted below. Verified that pt is taking iton an empty stomach. Well tolerated.  Patient tells me she had not had any in a few weeks.  She could not afford it because she took her dog to the vet and it was over $400.  Therefore she could not afford her medication.  Now she is very short of breath and swollen.  Patient in for follow-up of atrial fibrillation. Patient denies any recent bouts of chest pain or palpitations. Additionally, patient is taking anticoagulants. Patient denies any recent excessive bleeding episodes including epistaxis, bleeding from the gums, genitalia, rectal bleeding or hematuria. Additionally there has been no excessive bruising. History Ariatna has a past medical history of Anemia, Arthritis, Asthma, Atrial fibrillation ([place]), CAD (coronary artery disease), Cataract, CHF (congestive heart failure) ([place]), COPD (chronic obstructive pulmonary disease) ([place]), Diabetes mellitus, Gait instability, GERD (gastroesophageal reflux disease), Hyperlipidemia, Hypertension, Obesity, Oxygen dependent, Sleep apnea,  obstructive (2008), and Stroke [facility]).   She has a past surgical history that includes Cataract extraction w/ intraocular lens  implant, bilateral; Appendectomy; Cholecystectomy; Carpal tunnel release; Cervical spine surgery; Cesarean section; Umbilical hernia repair; Knee arthroscopy; Colonoscopy (11/22/2002); Upper gastrointestinal endoscopy (11/22/2002); Colonoscopy (11/26/2005); Femur IM nail (Left, 05/08/2013); Hip surgery (Left); and Eye surgery.   Her family history includes Alzheimer's disease in her father and mother; Asthma in her daughter and grandchild; Breast cancer in her sister; Cancer in her brother, mother, sister, and sister; Colon cancer in her mother; Diabetes in her daughter, mother, sister, and unknown relative; Heart attack in her mother, sister, and unknown relative; Heart disease in her brother, mother, and sister; Heart murmur in her brother; Kidney disease in her mother; Migraines in her sister.She reports that she has never smoked. She has never used smokeless tobacco. She reports that she does not drink alcohol or use drugs.  Current Outpatient Medications on File Prior to Visit  Medication Sig Dispense Refill  . acetaminophen (TYLENOL) 500 MG tablet Take by mouth daily.    . B-D ULTRAFINE III SHORT PEN 31G X 8 MM MISC Use to inject insulin twice daily as directed 200 each 3  . Blood Glucose Monitoring Suppl (TRUE METRIX GO GLUCOSE METER) w/Device KIT 1 strip by Does not apply route 2 (two) times daily. 1 kit 5  . diphenhydrAMINE (SOMINEX) 25 MG tablet Take 50 mg by mouth at bedtime as needed for sleep.    Marland Kitchen econazole nitrate 1 % cream Apply topically daily. 15 g 0  . Elastic Bandages & Supports (JOBST RELIEF 30-40MMHG XL) MISC Wear daily for swelling in legs 2 each 11  . fish oil-omega-3 fatty acids 1000 MG capsule Take 2 g by mouth daily.      Marland Kitchen  fluocinonide-emollient (LIDEX-E) 0.05 % cream Apply to feet after soaking each night 60 g 5  . ipratropium-albuterol (DUONEB)  0.5-2.5 (3) MG/3ML SOLN INHALE 1 VIAL (3ML) EVERY 4 HOURS BY NEBULIZATION  0  . Lancets MISC     . Multiple Vitamins-Minerals (HAIR SKIN AND NAILS FORMULA PO) Take 1 tablet by mouth daily.    . mupirocin ointment (BACTROBAN) 2 % Place 1 application into the nose 2 (two) times daily. 22 g 0  . vitamin B-12 (CYANOCOBALAMIN) 50 MCG tablet Take 50 mcg by mouth daily.    . ferrous sulfate 325 (65 FE) MG tablet Take 325 mg by mouth daily with breakfast.     No current facility-administered medications on file prior to visit.     ROS Review of Systems  Constitutional: Positive for fatigue.  HENT: Negative for congestion.   Eyes: Negative for visual disturbance.  Respiratory: Positive for shortness of breath.   Cardiovascular: Positive for leg swelling. Negative for chest pain.  Gastrointestinal: Negative for abdominal pain, constipation, diarrhea, nausea and vomiting.  Genitourinary: Negative for difficulty urinating.  Musculoskeletal: Negative for arthralgias and myalgias.  Neurological: Negative for headaches.  Psychiatric/Behavioral: Positive for sleep disturbance.    Objective:  BP 131/69   Pulse 78   Temp 97.9 F (36.6 C) (Oral)   Ht 5' 5"  (1.651 m)   Wt 296 lb 2 oz (134.3 kg)   BMI 49.28 kg/m   BP Readings from Last 3 Encounters:  12/20/18 131/69  09/08/18 137/74  08/24/18 (!) 152/72    Wt Readings from Last 3 Encounters:  12/20/18 296 lb 2 oz (134.3 kg)  08/24/18 283 lb 8 oz (128.6 kg)  07/05/18 269 lb 4 oz (122.1 kg)     Physical Exam Constitutional:      General: She is not in acute distress.    Appearance: She is well-developed.  HENT:     Head: Normocephalic and atraumatic.     Right Ear: External ear normal.     Left Ear: External ear normal.     Nose: Nose normal.  Eyes:     Conjunctiva/sclera: Conjunctivae normal.     Pupils: Pupils are equal, round, and reactive to light.  Neck:     Musculoskeletal: Normal range of motion and neck supple.      Thyroid: No thyromegaly.  Cardiovascular:     Rate and Rhythm: Normal rate. Rhythm irregularly irregular.     Heart sounds: Normal heart sounds. No murmur.  Pulmonary:     Effort: Pulmonary effort is normal. No respiratory distress.     Breath sounds: Normal breath sounds. No wheezing or rales.  Abdominal:     General: Bowel sounds are normal. There is no distension.     Palpations: Abdomen is soft.     Tenderness: There is no abdominal tenderness.  Musculoskeletal:     Right lower leg: 3+ Edema (3+) present.     Left lower leg: 3+ Edema (3+) present.  Lymphadenopathy:     Cervical: No cervical adenopathy.  Skin:    General: Skin is warm and dry.  Neurological:     Mental Status: She is alert and oriented to person, place, and time.     Deep Tendon Reflexes: Reflexes are normal and symmetric.  Psychiatric:        Behavior: Behavior normal.        Thought Content: Thought content normal.        Judgment: Judgment normal.  Assessment & Plan:   [name] was seen today for medical management of chronic issues.  Diagnoses and all orders for this visit:  Mixed hyperlipidemia -     CBC with Differential/Platelet -     CMP14+EGFR  Current use of long term anticoagulation -     apixaban (ELIQUIS) 5 MG TABS tablet; Take 1 tablet (5 mg total) by mouth 2 (two) times daily.  Chronic atrial fibrillation -     apixaban (ELIQUIS) 5 MG TABS tablet; Take 1 tablet (5 mg total) by mouth 2 (two) times daily. -     Brain natriuretic peptide  Type 2 diabetes mellitus with diabetic neuropathy, unspecified whether long term insulin use (HCC) -     Bayer DCA Hb A1c Waived  Hypothyroidism, unspecified type -     TSH -     T4, Free  Other orders -     albuterol (PROVENTIL HFA;VENTOLIN HFA) 108 (90 Base) MCG/ACT inhaler; Inhale 2 puffs into the lungs every 6 (six) hours as needed for wheezing or shortness of breath. -     alendronate (FOSAMAX) 70 MG tablet; Take 1 tablet (70 mg total)  by mouth every 7 (seven) days. -     diltiazem (CARDIZEM CD) 180 MG 24 hr capsule; TAKE 1 CAPSULE (180 MG TOTAL) BY MOUTH DAILY. -     Dulaglutide (TRULICITY) 4.09 WJ/1.9JY SOPN; INJECT 0.75 MG INTO THE SKIN ONCE A WEEK. -     glucose blood test strip; test sugars twice daily -     Insulin Detemir (LEVEMIR FLEXTOUCH) 100 UNIT/ML Pen; INJECT 70 UNITS INTO THE SKIN DAILY WITH BREAKFAST. -     potassium chloride SA (KLOR-CON M20) 20 MEQ tablet; Take 1 tablet (20 mEq total) by mouth daily. -     levothyroxine (SYNTHROID, LEVOTHROID) 50 MCG tablet; Take 1 tablet (50 mcg total) by mouth daily. -     losartan (COZAAR) 50 MG tablet; Take 1 tablet (50 mg total) by mouth daily. -     metFORMIN (GLUCOPHAGE-XR) 750 MG 24 hr tablet; Take 1 tablet (750 mg total) by mouth daily with breakfast. -     torsemide (DEMADEX) 20 MG tablet; Take 2 tablets (40 mg total) by mouth daily. -     traZODone (DESYREL) 150 MG tablet; Use from 1/3 to 1 tablet nightly as needed for sleep.      I have changed [name]'s KLOR-CON M20 to potassium chloride SA. I have also changed her torsemide. I am also having her start on traZODone. Additionally, I am having her maintain her fish oil-omega-3 fatty acids, Lancets, JOBST RELIEF 30-40MMHG XL, fluocinonide-emollient, TRUE METRIX GO GLUCOSE METER, acetaminophen, ferrous sulfate, vitamin B-12, Multiple Vitamins-Minerals (HAIR SKIN AND NAILS FORMULA PO), diphenhydrAMINE, econazole nitrate, ipratropium-albuterol, B-D ULTRAFINE III SHORT PEN, mupirocin ointment, apixaban, albuterol, alendronate, diltiazem, Dulaglutide, glucose blood, Insulin Detemir, levothyroxine, losartan, and metFORMIN.  Meds ordered this encounter  Medications  . apixaban (ELIQUIS) 5 MG TABS tablet    Sig: Take 1 tablet (5 mg total) by mouth 2 (two) times daily.    Dispense:  180 tablet    Refill:  1  . albuterol (PROVENTIL HFA;VENTOLIN HFA) 108 (90 Base) MCG/ACT inhaler    Sig: Inhale 2 puffs into the  lungs every 6 (six) hours as needed for wheezing or shortness of breath.    Dispense:  1 Inhaler    Refill:  5  . alendronate (FOSAMAX) 70 MG tablet    Sig: Take 1 tablet (70  mg total) by mouth every 7 (seven) days.    Dispense:  4 tablet    Refill:  5  . diltiazem (CARDIZEM CD) 180 MG 24 hr capsule    Sig: TAKE 1 CAPSULE (180 MG TOTAL) BY MOUTH DAILY.    Dispense:  90 capsule    Refill:  1  . Dulaglutide (TRULICITY) 6.88 TL/7.3CA SOPN    Sig: INJECT 0.75 MG INTO THE SKIN ONCE A WEEK.    Dispense:  4 pen    Refill:  5  . glucose blood test strip    Sig: test sugars twice daily    Dispense:  100 each    Refill:  12    Prodigy meter Dx E11.21  . Insulin Detemir (LEVEMIR FLEXTOUCH) 100 UNIT/ML Pen    Sig: INJECT 70 UNITS INTO THE SKIN DAILY WITH BREAKFAST.    Dispense:  30 pen    Refill:  3  . potassium chloride SA (KLOR-CON M20) 20 MEQ tablet    Sig: Take 1 tablet (20 mEq total) by mouth daily.    Dispense:  90 tablet    Refill:  1  . levothyroxine (SYNTHROID, LEVOTHROID) 50 MCG tablet    Sig: Take 1 tablet (50 mcg total) by mouth daily.    Dispense:  90 tablet    Refill:  1  . losartan (COZAAR) 50 MG tablet    Sig: Take 1 tablet (50 mg total) by mouth daily.    Dispense:  90 tablet    Refill:  3    Replaces lisinopril. Please DC scrip for lisinopril  . metFORMIN (GLUCOPHAGE-XR) 750 MG 24 hr tablet    Sig: Take 1 tablet (750 mg total) by mouth daily with breakfast.    Dispense:  90 tablet    Refill:  1  . torsemide (DEMADEX) 20 MG tablet    Sig: Take 2 tablets (40 mg total) by mouth daily.    Dispense:  180 tablet    Refill:  1  . traZODone (DESYREL) 150 MG tablet    Sig: Use from 1/3 to 1 tablet nightly as needed for sleep.    Dispense:  30 tablet    Refill:  5   Patient showing signs of worsening of her CHF.  This is almost certainly due to her going off of her medications including thyroid torsemide and apparently several others.  She is taking her insulin and her  Trulicity though.  Follow-up: Return in about 2 weeks (around 01/03/2019).  Claretta Fraise, M.D.

## 2018-12-21 ENCOUNTER — Other Ambulatory Visit: Payer: Self-pay | Admitting: *Deleted

## 2018-12-21 ENCOUNTER — Telehealth: Payer: Self-pay

## 2018-12-21 ENCOUNTER — Telehealth: Payer: Self-pay | Admitting: Family Medicine

## 2018-12-21 DIAGNOSIS — E039 Hypothyroidism, unspecified: Secondary | ICD-10-CM

## 2018-12-21 LAB — CMP14+EGFR
ALT: 11 IU/L (ref 0–32)
AST: 14 IU/L (ref 0–40)
Albumin/Globulin Ratio: 1.8 (ref 1.2–2.2)
Albumin: 4.2 g/dL (ref 3.7–4.7)
Alkaline Phosphatase: 202 IU/L — ABNORMAL HIGH (ref 39–117)
BUN/Creatinine Ratio: 13 (ref 12–28)
BUN: 15 mg/dL (ref 8–27)
Bilirubin Total: 0.7 mg/dL (ref 0.0–1.2)
CALCIUM: 9.2 mg/dL (ref 8.7–10.3)
CO2: 22 mmol/L (ref 20–29)
Chloride: 109 mmol/L — ABNORMAL HIGH (ref 96–106)
Creatinine, Ser: 1.2 mg/dL — ABNORMAL HIGH (ref 0.57–1.00)
GFR calc non Af Amer: 45 mL/min/{1.73_m2} — ABNORMAL LOW (ref >59)
GFR, EST AFRICAN AMERICAN: 51 mL/min/{1.73_m2} — AB (ref >59)
Globulin, Total: 2.3 g/dL (ref 1.5–4.5)
Glucose: 131 mg/dL — ABNORMAL HIGH (ref 65–99)
Potassium: 4.5 mmol/L (ref 3.5–5.2)
Sodium: 145 mmol/L — ABNORMAL HIGH (ref 134–144)
Total Protein: 6.5 g/dL (ref 6.0–8.5)

## 2018-12-21 LAB — CBC WITH DIFFERENTIAL/PLATELET
BASOS ABS: 0 10*3/uL (ref 0.0–0.2)
Basos: 1 %
EOS (ABSOLUTE): 0.1 10*3/uL (ref 0.0–0.4)
EOS: 2 %
Hematocrit: 31.9 % — ABNORMAL LOW (ref 34.0–46.6)
Hemoglobin: 10.3 g/dL — ABNORMAL LOW (ref 11.1–15.9)
IMMATURE GRANULOCYTES: 1 %
Immature Grans (Abs): 0 10*3/uL (ref 0.0–0.1)
Lymphocytes Absolute: 0.9 10*3/uL (ref 0.7–3.1)
Lymphs: 14 %
MCH: 30.6 pg (ref 26.6–33.0)
MCHC: 32.3 g/dL (ref 31.5–35.7)
MCV: 95 fL (ref 79–97)
MONOCYTES: 10 %
MONOS ABS: 0.6 10*3/uL (ref 0.1–0.9)
Neutrophils Absolute: 4.6 10*3/uL (ref 1.4–7.0)
Neutrophils: 72 %
Platelets: 133 10*3/uL — ABNORMAL LOW (ref 150–450)
RBC: 3.37 x10E6/uL — ABNORMAL LOW (ref 3.77–5.28)
RDW: 14.4 % (ref 11.7–15.4)
WBC: 6.4 10*3/uL (ref 3.4–10.8)

## 2018-12-21 LAB — TSH: TSH: 5.93 u[IU]/mL — ABNORMAL HIGH (ref 0.450–4.500)

## 2018-12-21 LAB — T4, FREE: Free T4: 1.26 ng/dL (ref 0.82–1.77)

## 2018-12-21 LAB — BRAIN NATRIURETIC PEPTIDE: BNP: 212.5 pg/mL — ABNORMAL HIGH (ref 0.0–100.0)

## 2018-12-21 MED ORDER — GLUCOSE BLOOD VI STRP: ORAL_STRIP | 12 refills | Status: DC

## 2018-12-21 MED ORDER — ACCU-CHEK GUIDE W/DEVICE KIT: 1.0000 | PACK | Freq: Two times a day (BID) | 0 refills | Status: DC

## 2018-12-21 NOTE — Telephone Encounter (Signed)
Please contact the patient find out what she wants to do. Get a new monitor or pay for the strips? Please do a scrip for the way she wants to go - monitor 7/or strips. Thanks, WS

## 2018-12-21 NOTE — Telephone Encounter (Signed)
Rx sent for glucometer and strips.

## 2018-12-21 NOTE — Telephone Encounter (Signed)
See previous call. New rx for new meter and strips sent into pharmacy.

## 2018-12-21 NOTE — Telephone Encounter (Signed)
Insurance denied Prodigy test strips  Preferred are accu chek aviva Plus  accu check guide  accu chek guide me or [facility] Truemetrix, True track

## 2019-01-03 ENCOUNTER — Encounter: Payer: Self-pay | Admitting: Family Medicine

## 2019-01-03 ENCOUNTER — Ambulatory Visit (INDEPENDENT_AMBULATORY_CARE_PROVIDER_SITE_OTHER): Payer: Medicare HMO | Admitting: Family Medicine

## 2019-01-03 VITALS — BP 147/77 | HR 76 | Temp 96.8°F | Ht 65.0 in | Wt 294.0 lb

## 2019-01-03 DIAGNOSIS — E1121 Type 2 diabetes mellitus with diabetic nephropathy: Secondary | ICD-10-CM | POA: Diagnosis not present

## 2019-01-03 DIAGNOSIS — E1140 Type 2 diabetes mellitus with diabetic neuropathy, unspecified: Secondary | ICD-10-CM | POA: Diagnosis not present

## 2019-01-03 DIAGNOSIS — I890 Lymphedema, not elsewhere classified: Secondary | ICD-10-CM | POA: Diagnosis not present

## 2019-01-03 DIAGNOSIS — I5022 Chronic systolic (congestive) heart failure: Secondary | ICD-10-CM

## 2019-01-03 NOTE — Progress Notes (Signed)
Subjective:  Patient ID: [name], female    DOB: [birth year]  Age: 73 y.o. MRN: [id number]  CC: Congestive Heart Failure (2 week follow up ) and Edema   HPI [name] presents for recheck of her swelling and shortness of breath.  She was able to get all of her medicines.  It took her a week working with the pharmacy to get them all.  However at this time she is breathing much better.  The swelling is down but she is still having trouble getting shoes on her feet.  She would like to have diabetic shoes.  She has significant stasis dermatitis and some numbness and tingling in the feet she has trouble getting her compression stockings on due to the degree of swelling.  She denies any chest pain.  No abdominal discomfort.  Depression screen [facility] 2/9 01/03/2019 09/08/2018 07/05/2018  Decreased Interest 0 0 0  Down, Depressed, Hopeless 0 0 0  PHQ - 2 Score 0 0 0  Altered sleeping - - -  Tired, decreased energy - - -  Change in appetite - - -  Feeling bad or failure about yourself  - - -  Trouble concentrating - - -  Moving slowly or fidgety/restless - - -  Suicidal thoughts - - -  PHQ-9 Score - - -  Difficult doing work/chores - - -  Some recent data might be hidden    History Cydney has a past medical history of Anemia, Arthritis, Asthma, Atrial fibrillation ([place]), CAD (coronary artery disease), Cataract, CHF (congestive heart failure) ([place]), COPD (chronic obstructive pulmonary disease) ([place]), Diabetes mellitus, Gait instability, GERD (gastroesophageal reflux disease), Hyperlipidemia, Hypertension, Obesity, Oxygen dependent, Sleep apnea, obstructive (2008), and Stroke ([place]).   She has a past surgical history that includes Cataract extraction w/ intraocular lens  implant, bilateral; Appendectomy; Cholecystectomy; Carpal tunnel release; Cervical spine surgery; Cesarean section; Umbilical hernia repair; Knee arthroscopy; Colonoscopy (11/22/2002); Upper gastrointestinal endoscopy  (11/22/2002); Colonoscopy (11/26/2005); Femur IM nail (Left, 05/08/2013); Hip surgery (Left); and Eye surgery.   Her family history includes Alzheimer's disease in her father and mother; Asthma in her daughter and grandchild; Breast cancer in her sister; Cancer in her brother, mother, sister, and sister; Colon cancer in her mother; Diabetes in her daughter, mother, sister, and another family member; Heart attack in her mother, sister, and another family member; Heart disease in her brother, mother, and sister; Heart murmur in her brother; Kidney disease in her mother; Migraines in her sister.She reports that she has never smoked. She has never used smokeless tobacco. She reports that she does not drink alcohol or use drugs.    ROS Review of Systems  Constitutional: Negative.   HENT: Negative.   Eyes: Negative for visual disturbance.  Respiratory: Negative for shortness of breath.   Cardiovascular: Positive for leg swelling. Negative for chest pain.  Gastrointestinal: Negative for abdominal pain.  Musculoskeletal: Negative for arthralgias.    Objective:  BP (!) 147/77   Pulse 76   Temp (!) 96.8 F (36 C) (Oral)   Ht 5' 5"  (1.651 m)   Wt 294 lb (133.4 kg)   BMI 48.92 kg/m   BP Readings from Last 3 Encounters:  01/03/19 (!) 147/77  12/20/18 131/69  09/08/18 137/74    Wt Readings from Last 3 Encounters:  01/03/19 294 lb (133.4 kg)  12/20/18 296 lb 2 oz (134.3 kg)  08/24/18 283 lb 8 oz (128.6 kg)     Physical Exam Constitutional:  General: She is not in acute distress.    Appearance: She is well-developed.  Cardiovascular:     Rate and Rhythm: Normal rate and regular rhythm.  Pulmonary:     Breath sounds: Normal breath sounds.  Musculoskeletal:     Right lower leg: Edema present.     Left lower leg: Edema present.  Skin:    General: Skin is warm and dry.  Neurological:     Mental Status: She is alert and oriented to person, place, and time.      3+ lymphedema  with stasis dermatitis  Diabetic Foot Form - Detailed   Diabetic Foot Exam - detailed Can the patient see the bottom of their feet?:  No Are the shoes appropriate in style and fit?:  No Is there swelling or and abnormal foot shape?:  Yes Is there a claw toe deformity?:  Yes Is there elevated skin temparature?:  Yes Is there foot or ankle muscle weakness?:  Yes Normal Range of Motion:  No Pulse Foot Exam completed.:  Yes  Right posterior Tibialias:  Absent Left posterior Tibialias:  Absent  Right Dorsalis Pedis:  Diminished Left Dorsalis Pedis:  Absent  Sensory Foot Exam Completed.:  Yes Semmes-Weinstein Monofilament Test R Site 1-Great Toe:  Neg L Site 1-Great Toe:  Neg        Assessment & Plan:   [name] was seen today for congestive heart failure and edema.  Diagnoses and all orders for this visit:  Type 2 diabetes mellitus with diabetic neuropathy, unspecified whether long term insulin use ([place]) -     DME Other see comment  Diabetic nephropathy associated with type 2 diabetes mellitus ([place]) -     DME Other see comment  Lymphedema of both lower extremities -     Ambulatory referral to Vascular Surgery  Chronic systolic congestive heart failure ([place])       I am having [name] maintain her fish oil-omega-3 fatty acids, Lancets, JOBST RELIEF 30-40MMHG XL, fluocinonide-emollient, acetaminophen, ferrous sulfate, vitamin B-12, Multiple Vitamins-Minerals (HAIR SKIN AND NAILS FORMULA PO), diphenhydrAMINE, econazole nitrate, ipratropium-albuterol, B-D ULTRAFINE III SHORT PEN, mupirocin ointment, apixaban, albuterol, alendronate, diltiazem, Dulaglutide, Insulin Detemir, potassium chloride SA, levothyroxine, losartan, metFORMIN, torsemide, traZODone, ACCU-CHEK GUIDE, and glucose blood.  Allergies as of 01/03/2019      Reactions   Lisinopril    R/t to kidney   Codeine Nausea And Vomiting      Medication List       Accurate as of January 03, 2019  5:00 PM. Always  use your most recent med list.        ACCU-CHEK GUIDE w/Device Kit 1 each by Does not apply route 2 (two) times daily. Dx E11.9   acetaminophen 500 MG tablet Commonly known as:  TYLENOL Take by mouth daily.   albuterol 108 (90 Base) MCG/ACT inhaler Commonly known as:  PROVENTIL HFA;VENTOLIN HFA Inhale 2 puffs into the lungs every 6 (six) hours as needed for wheezing or shortness of breath.   alendronate 70 MG tablet Commonly known as:  FOSAMAX Take 1 tablet (70 mg total) by mouth every 7 (seven) days.   apixaban 5 MG Tabs tablet Commonly known as:  ELIQUIS Take 1 tablet (5 mg total) by mouth 2 (two) times daily.   B-D ULTRAFINE III SHORT PEN 31G X 8 MM Misc Generic drug:  Insulin Pen Needle Use to inject insulin twice daily as directed   diltiazem 180 MG 24 hr capsule Commonly known as:  CARDIZEM CD TAKE 1 CAPSULE (180 MG TOTAL) BY MOUTH DAILY.   diphenhydrAMINE 25 MG tablet Commonly known as:  SOMINEX Take 50 mg by mouth at bedtime as needed for sleep.   Dulaglutide 0.75 MG/0.5ML Sopn Commonly known as:  TRULICITY INJECT 6.23 MG INTO THE SKIN ONCE A WEEK.   econazole nitrate 1 % cream Apply topically daily.   ferrous sulfate 325 (65 FE) MG tablet Take 325 mg by mouth daily with breakfast.   fish oil-omega-3 fatty acids 1000 MG capsule Take 2 g by mouth daily.   fluocinonide-emollient 0.05 % cream Commonly known as:  LIDEX-E Apply to feet after soaking each night   glucose blood test strip Commonly known as:  COOL BLOOD GLUCOSE TEST STRIPS Use to check BS twice daily. Dx E11.9   HAIR SKIN AND NAILS FORMULA PO Take 1 tablet by mouth daily.   Insulin Detemir 100 UNIT/ML Pen Commonly known as:  LEVEMIR FLEXTOUCH INJECT 70 UNITS INTO THE SKIN DAILY WITH BREAKFAST.   ipratropium-albuterol 0.5-2.5 (3) MG/3ML Soln Commonly known as:  DUONEB INHALE 1 VIAL (3ML) EVERY 4 HOURS BY NEBULIZATION   JOBST RELIEF 30-40MMHG XL Misc Wear daily for swelling in legs     Lancets Misc   levothyroxine 50 MCG tablet Commonly known as:  SYNTHROID, LEVOTHROID Take 1 tablet (50 mcg total) by mouth daily.   losartan 50 MG tablet Commonly known as:  COZAAR Take 1 tablet (50 mg total) by mouth daily.   metFORMIN 750 MG 24 hr tablet Commonly known as:  GLUCOPHAGE-XR Take 1 tablet (750 mg total) by mouth daily with breakfast.   mupirocin ointment 2 % Commonly known as:  BACTROBAN Place 1 application into the nose 2 (two) times daily.   potassium chloride SA 20 MEQ tablet Commonly known as:  KLOR-CON M20 Take 1 tablet (20 mEq total) by mouth daily.   torsemide 20 MG tablet Commonly known as:  DEMADEX Take 2 tablets (40 mg total) by mouth daily.   traZODone 150 MG tablet Commonly known as:  DESYREL Use from 1/3 to 1 tablet nightly as needed for sleep.   vitamin B-12 50 MCG tablet Commonly known as:  CYANOCOBALAMIN Take 50 mcg by mouth daily.            Durable Medical Equipment  (From admission, onward)         Start     Ordered   01/03/19 0000  DME Other see comment    Comments:  Diabetic Shoes, #1 pair DX dm II and diabetic neuropathy.   01/03/19 1605           Follow-up: Return in about 1 month (around 02/01/2019).  Claretta Fraise, M.D.

## 2019-01-12 ENCOUNTER — Other Ambulatory Visit: Payer: Self-pay | Admitting: Family Medicine

## 2019-03-21 ENCOUNTER — Encounter: Payer: Self-pay | Admitting: Family Medicine

## 2019-03-21 ENCOUNTER — Ambulatory Visit (INDEPENDENT_AMBULATORY_CARE_PROVIDER_SITE_OTHER): Payer: Medicare HMO | Admitting: Family Medicine

## 2019-03-21 ENCOUNTER — Other Ambulatory Visit: Payer: Self-pay

## 2019-03-21 DIAGNOSIS — I482 Chronic atrial fibrillation, unspecified: Secondary | ICD-10-CM

## 2019-03-21 DIAGNOSIS — Z7901 Long term (current) use of anticoagulants: Secondary | ICD-10-CM | POA: Diagnosis not present

## 2019-03-21 DIAGNOSIS — E1121 Type 2 diabetes mellitus with diabetic nephropathy: Secondary | ICD-10-CM

## 2019-03-21 DIAGNOSIS — E039 Hypothyroidism, unspecified: Secondary | ICD-10-CM

## 2019-03-21 MED ORDER — FLUOCINONIDE-E 0.05 % EX CREA: 1.0000 "application " | TOPICAL_CREAM | Freq: Two times a day (BID) | CUTANEOUS | 5 refills | Status: DC

## 2019-03-21 MED ORDER — APIXABAN 2.5 MG PO TABS: 2.5000 mg | ORAL_TABLET | Freq: Two times a day (BID) | ORAL | 5 refills | Status: DC

## 2019-03-21 NOTE — Progress Notes (Signed)
Subjective:    Patient ID: [name], female    DOB: 1946-07-19, 73 y.o.   MRN: [id number]  CC: No chief complaint on file.   HPI: [name] is a 73 y.o. female presenting for No chief complaint on file.   Follow-up of diabetes. Patient does  check blood sugar at home Patient denies symptoms such as polyuria, polydipsia, excessive hunger, nausea No significant hypoglycemic spells noted. Medications as noted below. Taking them regularly without complication/adverse reaction being reported today.  Two lows in the morning last month. 73, 89. Made her feel weak and shaky, sweating.  One time it got to 202 but came down to 122 overnight.   AM range 122-137 this month  Wearing O2 2liters [place]. If it gets bad she gets relief by going to 2.5 liters. Afraid of tripping over a long O2 cord. Wants portable concentrator. Can't afford.   Depression screen [facility] 2/9 01/03/2019 09/08/2018 07/05/2018 05/28/2018 05/18/2018  Decreased Interest 0 0 0 0 0  Down, Depressed, Hopeless 0 0 0 0 0  PHQ - 2 Score 0 0 0 0 0  Altered sleeping - - - - -  Tired, decreased energy - - - - -  Change in appetite - - - - -  Feeling bad or failure about yourself  - - - - -  Trouble concentrating - - - - -  Moving slowly or fidgety/restless - - - - -  Suicidal thoughts - - - - -  PHQ-9 Score - - - - -  Difficult doing work/chores - - - - -  Some recent data might be hidden     Relevant past medical, surgical, family and social history reviewed and updated as indicated.  Interim medical history since our last visit reviewed. Allergies and medications reviewed and updated.  ROS:  Review of Systems  Constitutional: Negative.   HENT: Negative for congestion.   Eyes: Negative for visual disturbance.  Respiratory: Negative for shortness of breath.   Cardiovascular: Negative for chest pain.  Gastrointestinal: Negative for abdominal pain, constipation, diarrhea, nausea and vomiting.  Genitourinary:  Negative for difficulty urinating.  Musculoskeletal: Negative for arthralgias (feet hurt day and night.) and myalgias.  Neurological: Negative for headaches.  Psychiatric/Behavioral: Negative for sleep disturbance.     Social History   Tobacco Use  Smoking Status Never Smoker  Smokeless Tobacco Never Used       Objective:     Wt Readings from Last 3 Encounters:  01/03/19 294 lb (133.4 kg)  12/20/18 296 lb 2 oz (134.3 kg)  08/24/18 283 lb 8 oz (128.6 kg)     Exam deferred. Pt. Harboring due to COVID 19. Phone visit performed.   Assessment & Plan:    Diagnoses and all orders for this visit:  Diabetic nephropathy associated with type 2 diabetes mellitus ([place])  Hypothyroidism, unspecified type   Virtual Visit via telephone Note  I discussed the limitations, risks, security and privacy concerns of performing an evaluation and management service by telephone and the availability of in person appointments. The patient expressed understanding and agreed to proceed. Pt. Is at home. Dr. Livia Snellen is in his office.  Follow Up Instructions:   I discussed the assessment and treatment plan with the patient. The patient was provided an opportunity to ask questions and all were answered. The patient agreed with the plan and demonstrated an understanding of the instructions.   The patient was advised to call back or seek an  in-person evaluation if the symptoms worsen or if the condition fails to improve as anticipated.  Visit started: 4:40 Call ended:  5:02 Total minutes including chart review and phone contact time: 28   Follow up plan: No follow-ups on file.  Claretta Fraise, MD [facility] 03/21/2019, 4:45 PM

## 2019-04-07 ENCOUNTER — Telehealth: Payer: Self-pay | Admitting: Cardiology

## 2019-04-07 NOTE — Telephone Encounter (Signed)
Virtual Visit Pre-Appointment Phone Call  "(Name), I am calling you today to discuss your upcoming appointment. We are currently trying to limit exposure to the virus that causes COVID-19 by seeing patients at home rather than in the office."  1. "What is the BEST phone number to call the day of the visit?" - include this in appointment notes  2. Do you have or have access to (through a family member/friend) a smartphone with video capability that we can use for your visit?" a. If yes - list this number in appt notes as cell (if different from BEST phone #) and list the appointment type as a VIDEO visit in appointment notes b. If no - list the appointment type as a PHONE visit in appointment notes  3. Confirm consent - "In the setting of the current Covid19 crisis, you are scheduled for a (phone or video) visit with your provider on (date) at (time).  Just as we do with many in-office visits, in order for you to participate in this visit, we must obtain consent.  If you'd like, I can send this to your mychart (if signed up) or email for you to review.  Otherwise, I can obtain your verbal consent now.  All virtual visits are billed to your insurance company just like a normal visit would be.  By agreeing to a virtual visit, we'd like you to understand that the technology does not allow for your provider to perform an examination, and thus may limit your provider's ability to fully assess your condition. If your provider identifies any concerns that need to be evaluated in person, we will make arrangements to do so.  Finally, though the technology is pretty good, we cannot assure that it will always work on either your or our end, and in the setting of a video visit, we may have to convert it to a phone-only visit.  In either situation, we cannot ensure that we have a secure connection.  Are you willing to proceed?" STAFF: Did the patient verbally acknowledge consent to telehealth visit? Document  YES/NO here: Yes  4. Advise patient to be prepared - "Two hours prior to your appointment, go ahead and check your blood pressure, pulse, oxygen saturation, and your weight (if you have the equipment to check those) and write them all down. When your visit starts, your provider will ask you for this information. If you have an Apple Watch or Kardia device, please plan to have heart rate information ready on the day of your appointment. Please have a pen and paper handy nearby the day of the visit as well."  5. Give patient instructions for MyChart download to smartphone OR [email or web address] as below if video visit (depending on what platform provider is using)  6. Inform patient they will receive a phone call 15 minutes prior to their appointment time (may be from unknown caller ID) so they should be prepared to answer    [name] has been deemed a candidate for a follow-up tele-health visit to limit community exposure during the Covid-19 pandemic. I spoke with the patient via phone to ensure availability of phone/video source, confirm preferred email & phone number, and discuss instructions and expectations.  I reminded [name] to be prepared with any vital sign and/or heart rhythm information that could potentially be obtained via home monitoring, at the time of her visit. I reminded [name] to expect a phone call prior to  her visit.  [name] 04/07/2019 1:16 PM

## 2019-04-11 NOTE — Progress Notes (Signed)
Virtual Visit via Telephone Note   This visit type was conducted due to national recommendations for restrictions regarding the COVID-19 Pandemic (e.g. social distancing) in an effort to limit this patient's exposure and mitigate transmission in our community.  Due to her co-morbid illnesses, this patient is at least at moderate risk for complications without adequate follow up.  This format is felt to be most appropriate for this patient at this time.  The patient did not have access to video technology/had technical difficulties with video requiring transitioning to audio format only (telephone).  All issues noted in this document were discussed and addressed.  No physical exam could be performed with this format.  Please refer to the patient's chart for her  consent to telehealth for [facility].   Date:  04/12/2019   ID:  [name], DOB 06/04/1946, MRN [id number]  Patient Location: Home Provider Location: Home  PCP:  Claretta Fraise, MD  Cardiologist:  Minus Breeding, MD  Electrophysiologist:  None   Evaluation Performed:  Follow-Up Visit  Chief Complaint:  Atrial fib  History of Present Illness:    [name] is a 73 y.o. female who presents for follow up of  nonobstructive coronary disease and diastolic heart failure and atrial fibrillation.    She has chronic oxygen use.   Since I last saw her she sounds like she has done well.  She uses her oxygen all the time instead of just at night which she was doing before.  I do see in Feb when she was seen by Dr. Livia Snellen she had significant lower extremity edema and there is even a picture.  She says this is about chronic.  Her BNP was slightly elevated at that point.  She was to be referred to vascular surgery.  This has not taken place yet.  She is not describing any new problems.  She does get short of breath if she has to come off of her oxygen to ambulate through her house.  She is not describing otherwise new shortness of  breath and no PND or orthopnea.  Her weight is been stable this year though it is up from last year.  It does not sound like she watches her diet necessarily strictly as I would like.  She is not having any cough fevers or chills.  She stays in the home although her daughter who works at a nursing home comes back and forth to bring her groceries.  She denies any chest pressure, neck or arm discomfort.  She does not notice palpitations unless she is particularly short of breath without her oxygen and goes away quickly.  The patient does not have symptoms concerning for COVID-19 infection (fever, chills, cough, or new shortness of breath).    Past Medical History:  Diagnosis Date  . Anemia   . Arthritis   . Asthma   . Atrial fibrillation ([place])   . CAD (coronary artery disease)    LAD 60% proximal stenosis mid and distal 60% stenosis 2009.  . Cataract    had surgery  . CHF (congestive heart failure) ([place])   . COPD (chronic obstructive pulmonary disease) ([place])   . Diabetes mellitus   . Gait instability    uses cane  . GERD (gastroesophageal reflux disease)   . Hyperlipidemia   . Hypertension   . Obesity   . Oxygen dependent    2 liter per nasal cannula  . Sleep apnea, obstructive 2008  . Stroke [facility])  2 mini   Past Surgical History:  Procedure Laterality Date  . APPENDECTOMY    . CARPAL TUNNEL RELEASE     right hand  . CATARACT EXTRACTION W/ INTRAOCULAR LENS  IMPLANT, BILATERAL    . CERVICAL SPINE SURGERY     fusion  . CESAREAN SECTION    . CHOLECYSTECTOMY    . COLONOSCOPY  11/22/2002   normal - Dr.Rrourk  . COLONOSCOPY  11/26/2005   incomplete with negative BE (Dr. Rowe Pavy, [place])  . EYE SURGERY    . FEMUR IM NAIL Left 05/08/2013   Procedure: INTRAMEDULLARY (IM) NAIL FEMORAL--LONG(BIOMET SYSTEM) and Zimmer Cables;  Surgeon: Augustin Schooling, MD;  Location: [facility];  Service: Orthopedics;  Laterality: Left;  . HIP SURGERY Left   . KNEE ARTHROSCOPY     twice, left  . UMBILICAL  HERNIA REPAIR    . UPPER GASTROINTESTINAL ENDOSCOPY  11/22/2002   Normal - Dr. Gala Romney     Current Meds  Medication Sig  . acetaminophen (TYLENOL) 500 MG tablet Take 500 mg by mouth every 4 (four) hours as needed.   Marland Kitchen albuterol (PROVENTIL HFA;VENTOLIN HFA) 108 (90 Base) MCG/ACT inhaler Inhale 2 puffs into the lungs every 6 (six) hours as needed for wheezing or shortness of breath.  Marland Kitchen alendronate (FOSAMAX) 70 MG tablet Take 1 tablet (70 mg total) by mouth every 7 (seven) days.  Marland Kitchen apixaban (ELIQUIS) 2.5 MG TABS tablet Take 1 tablet (2.5 mg total) by mouth 2 (two) times daily. (Patient taking differently: Take 2.5 mg by mouth 2 (two) times daily. Bottle at home is 5 mg BID)  . B-D ULTRAFINE III SHORT PEN 31G X 8 MM MISC Use to inject insulin twice daily as directed  . Blood Glucose Monitoring Suppl (ACCU-CHEK GUIDE) w/Device KIT 1 each by Does not apply route 2 (two) times daily. Dx E11.9  . diltiazem (CARDIZEM CD) 180 MG 24 hr capsule TAKE 1 CAPSULE (180 MG TOTAL) BY MOUTH DAILY.  . Dulaglutide (TRULICITY) 9.52 WU/1.3KG SOPN INJECT 0.75 MG INTO THE SKIN ONCE A WEEK.  Marland Kitchen econazole nitrate 1 % cream Apply topically daily.  . Elastic Bandages & Supports (JOBST RELIEF 30-40MMHG XL) MISC Wear daily for swelling in legs  . ferrous sulfate 325 (65 FE) MG tablet Take 325 mg by mouth daily with breakfast.  . fish oil-omega-3 fatty acids 1000 MG capsule Take 2 g by mouth daily.    . fluocinonide-emollient (LIDEX-E) 0.05 % cream Apply 1 application topically 2 (two) times daily. To affected areas  . glucose blood (COOL BLOOD GLUCOSE TEST STRIPS) test strip Use to check BS twice daily. Dx E11.9  . Insulin Detemir (LEVEMIR FLEXTOUCH) 100 UNIT/ML Pen INJECT 70 UNITS INTO THE SKIN DAILY WITH BREAKFAST.  Marland Kitchen ipratropium-albuterol (DUONEB) 0.5-2.5 (3) MG/3ML SOLN INHALE 1 VIAL (3ML) EVERY 4 HOURS BY NEBULIZATION  . Lancets MISC   . levothyroxine (SYNTHROID, LEVOTHROID) 50 MCG tablet Take 1 tablet (50 mcg total) by  mouth daily.  Marland Kitchen losartan (COZAAR) 50 MG tablet Take 1 tablet (50 mg total) by mouth daily.  . metFORMIN (GLUCOPHAGE-XR) 750 MG 24 hr tablet Take 1 tablet (750 mg total) by mouth daily with breakfast.  . mupirocin ointment (BACTROBAN) 2 % Place 1 application into the nose 2 (two) times daily.  . potassium chloride SA (KLOR-CON M20) 20 MEQ tablet Take 1 tablet (20 mEq total) by mouth daily.  Marland Kitchen torsemide (DEMADEX) 20 MG tablet Take 2 tablets (40 mg total) by mouth daily.  Marland Kitchen  traZODone (DESYREL) 150 MG tablet USE FROM 1/3 TO 1 TABLET NIGHTLY AS NEEDED FOR SLEEP     Allergies:   Lisinopril and Codeine   Social History   Tobacco Use  . Smoking status: Never Smoker  . Smokeless tobacco: Never Used  Substance Use Topics  . Alcohol use: No  . Drug use: No     Family Hx: The patient's family history includes Alzheimer's disease in her father and mother; Asthma in her daughter and grandchild; Breast cancer in her sister; Cancer in her brother, mother, sister, and sister; Colon cancer in her mother; Diabetes in her daughter, mother, sister, and another family member; Heart attack in her mother, sister, and another family member; Heart disease in her brother, mother, and sister; Heart murmur in her brother; Kidney disease in her mother; Migraines in her sister.  ROS:   Please see the history of present illness.    As stated in the HPI and negative for all other systems.   Prior CV studies:   The following studies were reviewed today:    Labs/Other Tests and Data Reviewed:    EKG:  Rate 86, 04/29/17  Atrial fib   Recent Labs: 12/20/2018: ALT 11; BNP 212.5; BUN 15; Creatinine, Ser 1.20; Hemoglobin 10.3; Platelets 133; Potassium 4.5; Sodium 145; TSH 5.930   Recent Lipid Panel Lab Results  Component Value Date/Time   CHOL 157 09/08/2018 04:43 PM   TRIG 79 09/08/2018 04:43 PM   TRIG 188 (H) 09/24/2015 11:30 AM   HDL 53 09/08/2018 04:43 PM   HDL 42 09/24/2015 11:30 AM   CHOLHDL 3.0  09/08/2018 04:43 PM   CHOLHDL 4.0 08/12/2008 03:30 AM   LDLCALC 88 09/08/2018 04:43 PM   LDLCALC 33 09/22/2013 12:07 PM    Wt Readings from Last 3 Encounters:  04/12/19 296 lb 6.4 oz (134.4 kg)  01/03/19 294 lb (133.4 kg)  12/20/18 296 lb 2 oz (134.3 kg)     Objective:    Vital Signs:  BP (!) 147/72   Pulse 68   Ht 5' 5"  (1.651 m)   Wt 296 lb 6.4 oz (134.4 kg)   BMI 49.32 kg/m    VITAL SIGNS:  reviewed  ASSESSMENT & PLAN:    ATRIAL FIBRILLATION:  She tolerates anticoagulation.  Ms. KADEY MIHALIC has a CHA2DS2 - VASc score of 7.    She tolerates her anticoagulation.  She had no anemia on CBC earlier this year.  No change in therapy.  HTN:  The blood pressure is slightly elevated but this is somewhat unusual.  She should continue the meds as listed and salt and fluid restriction.  No change in therapy.  CAD:     She is had no symptoms since stress testing in 2013.  No change in therapy.  DIASTOLIC HF:      We talked about salt and fluid restriction.  She is not having any new shortness of breath.  The BNP was very mildly slightly elevated.  I think for now she can continue the diuretics as listed   EDEMA:    This seems to be the most significant problem but chronic.  No change in therapy.  DM:  A1C 6.8.  Per Dr. Livia Snellen   COVID-19 Education: The signs and symptoms of COVID-19 were discussed with the patient and how to seek care for testing (follow up with PCP or arrange E-visit).  The importance of social distancing was discussed today.  Time:   Today, I have spent  21 minutes with the patient with telehealth technology discussing the above problems.     Medication Adjustments/Labs and Tests Ordered: Current medicines are reviewed at length with the patient today.  Concerns regarding medicines are outlined above.   Tests Ordered: No orders of the defined types were placed in this encounter.   Medication Changes: No orders of the defined types were placed in this  encounter.   Disposition:  Follow up with me in two years  Signed, Minus Breeding, MD  04/12/2019 11:20 AM    [place]

## 2019-04-12 ENCOUNTER — Encounter: Payer: Self-pay | Admitting: Cardiology

## 2019-04-12 ENCOUNTER — Telehealth (INDEPENDENT_AMBULATORY_CARE_PROVIDER_SITE_OTHER): Payer: Self-pay | Admitting: Cardiology

## 2019-04-12 VITALS — BP 147/72 | HR 68 | Ht 65.0 in | Wt 296.4 lb

## 2019-04-12 DIAGNOSIS — I2510 Atherosclerotic heart disease of native coronary artery without angina pectoris: Secondary | ICD-10-CM | POA: Insufficient documentation

## 2019-04-12 DIAGNOSIS — M7989 Other specified soft tissue disorders: Secondary | ICD-10-CM | POA: Insufficient documentation

## 2019-04-12 DIAGNOSIS — I482 Chronic atrial fibrillation, unspecified: Secondary | ICD-10-CM

## 2019-04-12 DIAGNOSIS — Z7189 Other specified counseling: Secondary | ICD-10-CM

## 2019-04-12 DIAGNOSIS — I5032 Chronic diastolic (congestive) heart failure: Secondary | ICD-10-CM

## 2019-04-12 NOTE — Patient Instructions (Addendum)
Medication Instructions:   Your physician recommends that you continue on your current medications as directed. Please refer to the Current Medication list given to you today.  Labwork:  NONE  Testing/Procedures:  NONE  Follow-Up:  Your physician recommends that you schedule a follow-up appointment in: 2 years. You will receive a reminder letter in the mail in about 20 months reminding you to call and schedule your appointment. If you don't receive this letter, please contact our office.  Any Other Special Instructions Will Be Listed Below (If Applicable).  If you need a refill on your cardiac medications before your next appointment, please call your pharmacy.

## 2019-04-27 ENCOUNTER — Telehealth: Payer: Commercial Managed Care - HMO | Admitting: Cardiology

## 2019-06-13 ENCOUNTER — Other Ambulatory Visit: Payer: Self-pay | Admitting: Family Medicine

## 2019-06-15 ENCOUNTER — Ambulatory Visit (INDEPENDENT_AMBULATORY_CARE_PROVIDER_SITE_OTHER): Payer: Medicare HMO | Admitting: Family Medicine

## 2019-06-15 ENCOUNTER — Encounter: Payer: Self-pay | Admitting: Family Medicine

## 2019-06-15 DIAGNOSIS — Z7901 Long term (current) use of anticoagulants: Secondary | ICD-10-CM

## 2019-06-15 DIAGNOSIS — I482 Chronic atrial fibrillation, unspecified: Secondary | ICD-10-CM

## 2019-06-15 DIAGNOSIS — E1121 Type 2 diabetes mellitus with diabetic nephropathy: Secondary | ICD-10-CM | POA: Diagnosis not present

## 2019-06-15 DIAGNOSIS — E1140 Type 2 diabetes mellitus with diabetic neuropathy, unspecified: Secondary | ICD-10-CM | POA: Diagnosis not present

## 2019-06-15 MED ORDER — LEVEMIR FLEXTOUCH 100 UNIT/ML ~~LOC~~ SOPN: 40.0000 [IU] | PEN_INJECTOR | Freq: Every day | SUBCUTANEOUS | 3 refills | Status: DC

## 2019-06-15 MED ORDER — POTASSIUM CHLORIDE CRYS ER 20 MEQ PO TBCR: 20.0000 meq | EXTENDED_RELEASE_TABLET | Freq: Every day | ORAL | 1 refills | Status: DC

## 2019-06-15 MED ORDER — FERROUS SULFATE 325 (65 FE) MG PO TABS: 325.0000 mg | ORAL_TABLET | Freq: Every day | ORAL | 0 refills | Status: DC

## 2019-06-15 MED ORDER — TORSEMIDE 20 MG PO TABS: 40.0000 mg | ORAL_TABLET | Freq: Every day | ORAL | 1 refills | Status: DC

## 2019-06-15 MED ORDER — GABAPENTIN 300 MG PO CAPS: ORAL_CAPSULE | ORAL | 0 refills | Status: DC

## 2019-06-15 MED ORDER — ALENDRONATE SODIUM 70 MG PO TABS: 70.0000 mg | ORAL_TABLET | ORAL | 5 refills | Status: DC

## 2019-06-15 MED ORDER — TRAZODONE HCL 300 MG PO TABS: 300.0000 mg | ORAL_TABLET | Freq: Every day | ORAL | 1 refills | Status: DC

## 2019-06-15 MED ORDER — APIXABAN 2.5 MG PO TABS: 2.5000 mg | ORAL_TABLET | Freq: Two times a day (BID) | ORAL | 5 refills | Status: DC

## 2019-06-15 MED ORDER — LEVOTHYROXINE SODIUM 50 MCG PO TABS: 50.0000 ug | ORAL_TABLET | Freq: Every day | ORAL | 1 refills | Status: DC

## 2019-06-15 MED ORDER — LOSARTAN POTASSIUM 50 MG PO TABS: 50.0000 mg | ORAL_TABLET | Freq: Every day | ORAL | 3 refills | Status: DC

## 2019-06-15 MED ORDER — METFORMIN HCL ER 750 MG PO TB24: ORAL_TABLET | ORAL | 1 refills | Status: DC

## 2019-06-15 MED ORDER — DILTIAZEM HCL ER COATED BEADS 180 MG PO CP24: ORAL_CAPSULE | ORAL | 1 refills | Status: DC

## 2019-06-15 MED ORDER — TRULICITY 0.75 MG/0.5ML ~~LOC~~ SOAJ: SUBCUTANEOUS | 5 refills | Status: DC

## 2019-06-15 NOTE — Progress Notes (Signed)
Subjective:    Patient ID: [name], female    DOB: [birth year], 73 y.o.   MRN: [id number]   HPI: [name] is a 73 y.o. female presenting for presents forFollow-up of diabetes. Patient checks blood sugar at home.   100-110 fasting and 120-140 postprandial Patient denies symptoms such as polyuria, polydipsia, excessive hunger, nausea No significant hypoglycemic spells noted. Medications reviewed. Pt reports taking them regularly without complication/adverse reaction being reported today.  Checking feet daily. Feet and legs are killing her. Getting worse. Feels like a toothache with throbbing. Sometimes it is 10/10 Last eye appt was over 1 year ago. Promises to make an appointment today.    Depression screen [facility] 2/9 01/03/2019 09/08/2018 07/05/2018 05/28/2018 05/18/2018  Decreased Interest 0 0 0 0 0  Down, Depressed, Hopeless 0 0 0 0 0  PHQ - 2 Score 0 0 0 0 0  Altered sleeping - - - - -  Tired, decreased energy - - - - -  Change in appetite - - - - -  Feeling bad or failure about yourself  - - - - -  Trouble concentrating - - - - -  Moving slowly or fidgety/restless - - - - -  Suicidal thoughts - - - - -  PHQ-9 Score - - - - -  Difficult doing work/chores - - - - -  Some recent data might be hidden     Relevant past medical, surgical, family and social history reviewed and updated as indicated.  Interim medical history since our last visit reviewed. Allergies and medications reviewed and updated.  ROS:  Review of Systems  Constitutional: Negative.   HENT: Negative for congestion.   Eyes: Negative for visual disturbance.  Respiratory: Negative for shortness of breath.   Cardiovascular: Negative for chest pain.  Gastrointestinal: Negative for abdominal pain, constipation, diarrhea, nausea and vomiting.  Genitourinary: Negative for difficulty urinating.  Musculoskeletal: Negative for arthralgias and myalgias.  Neurological: Negative for headaches.   Psychiatric/Behavioral: Negative for sleep disturbance.     Social History   Tobacco Use  Smoking Status Never Smoker  Smokeless Tobacco Never Used       Objective:     Wt Readings from Last 3 Encounters:  04/12/19 296 lb 6.4 oz (134.4 kg)  01/03/19 294 lb (133.4 kg)  12/20/18 296 lb 2 oz (134.3 kg)     Exam deferred. Pt. [place] due to COVID 19. Phone visit performed.   Assessment & Plan:   1. Diabetic nephropathy associated with type 2 diabetes mellitus ([place])   2. Type 2 diabetes mellitus with diabetic neuropathy, unspecified whether long term insulin use ([facility])   3. Current use of long term anticoagulation   4. Chronic atrial fibrillation     Meds ordered this encounter  Medications  . Insulin Detemir (LEVEMIR FLEXTOUCH) 100 UNIT/ML Pen    Sig: Inject 40 Units into the skin daily with breakfast. INJECT 70 UNITS INTO THE SKIN DAILY WITH BREAKFAST.    Dispense:  30 pen    Refill:  3  . traZODone (DESYREL) 300 MG tablet    Sig: Take 1 tablet (300 mg total) by mouth at bedtime.    Dispense:  90 tablet    Refill:  1  . DISCONTD: gabapentin (NEURONTIN) 300 MG capsule    Sig: 1 at bedtime for1 week then 2  The next  week then 3 the next week then 4 daily.    Dispense:  120 capsule  Refill:  0  . alendronate (FOSAMAX) 70 MG tablet    Sig: Take 1 tablet (70 mg total) by mouth every 7 (seven) days.    Dispense:  4 tablet    Refill:  5  . losartan (COZAAR) 50 MG tablet    Sig: Take 1 tablet (50 mg total) by mouth daily.    Dispense:  90 tablet    Refill:  3    Replaces lisinopril. Please DC scrip for lisinopril  . levothyroxine (SYNTHROID) 50 MCG tablet    Sig: Take 1 tablet (50 mcg total) by mouth daily.    Dispense:  90 tablet    Refill:  1  . torsemide (DEMADEX) 20 MG tablet    Sig: Take 2 tablets (40 mg total) by mouth daily.    Dispense:  180 tablet    Refill:  1  . ferrous sulfate 325 (65 FE) MG tablet    Sig: Take 1 tablet (325 mg total) by mouth daily  with breakfast.    Dispense:  90 tablet    Refill:  0  . potassium chloride SA (KLOR-CON M20) 20 MEQ tablet    Sig: Take 1 tablet (20 mEq total) by mouth daily.    Dispense:  90 tablet    Refill:  1  . apixaban (ELIQUIS) 2.5 MG TABS tablet    Sig: Take 1 tablet (2.5 mg total) by mouth 2 (two) times daily.    Dispense:  60 tablet    Refill:  5  . metFORMIN (GLUCOPHAGE-XR) 750 MG 24 hr tablet    Sig: TAKE 1 TABLET BY MOUTH EVERY DAY WITH BREAKFAST    Dispense:  90 tablet    Refill:  1  . diltiazem (CARDIZEM CD) 180 MG 24 hr capsule    Sig: TAKE 1 CAPSULE (180 MG TOTAL) BY MOUTH DAILY.    Dispense:  90 capsule    Refill:  1  . Dulaglutide (TRULICITY) 1.44 RX/5.4MG SOPN    Sig: INJECT 0.75 MG INTO THE SKIN ONCE A WEEK.    Dispense:  4 pen    Refill:  5  . gabapentin (NEURONTIN) 300 MG capsule    Sig: 1 at bedtime for1 week then 2  The next  week then 3 the next week then 4 daily.    Dispense:  120 capsule    Refill:  0    No orders of the defined types were placed in this encounter.     Diagnoses and all orders for this visit:  Diabetic nephropathy associated with type 2 diabetes mellitus ([place])  Type 2 diabetes mellitus with diabetic neuropathy, unspecified whether long term insulin use ([place])  Current use of long term anticoagulation -     apixaban (ELIQUIS) 2.5 MG TABS tablet; Take 1 tablet (2.5 mg total) by mouth 2 (two) times daily.  Chronic atrial fibrillation -     apixaban (ELIQUIS) 2.5 MG TABS tablet; Take 1 tablet (2.5 mg total) by mouth 2 (two) times daily.  Other orders -     Insulin Detemir (LEVEMIR FLEXTOUCH) 100 UNIT/ML Pen; Inject 40 Units into the skin daily with breakfast. INJECT 70 UNITS INTO THE SKIN DAILY WITH BREAKFAST. -     traZODone (DESYREL) 300 MG tablet; Take 1 tablet (300 mg total) by mouth at bedtime. -     Discontinue: gabapentin (NEURONTIN) 300 MG capsule; 1 at bedtime for1 week then 2  The next  week then 3 the next week then 4 daily. -  alendronate (FOSAMAX) 70 MG tablet; Take 1 tablet (70 mg total) by mouth every 7 (seven) days. -     losartan (COZAAR) 50 MG tablet; Take 1 tablet (50 mg total) by mouth daily. -     levothyroxine (SYNTHROID) 50 MCG tablet; Take 1 tablet (50 mcg total) by mouth daily. -     torsemide (DEMADEX) 20 MG tablet; Take 2 tablets (40 mg total) by mouth daily. -     ferrous sulfate 325 (65 FE) MG tablet; Take 1 tablet (325 mg total) by mouth daily with breakfast. -     potassium chloride SA (KLOR-CON M20) 20 MEQ tablet; Take 1 tablet (20 mEq total) by mouth daily. -     metFORMIN (GLUCOPHAGE-XR) 750 MG 24 hr tablet; TAKE 1 TABLET BY MOUTH EVERY DAY WITH BREAKFAST -     diltiazem (CARDIZEM CD) 180 MG 24 hr capsule; TAKE 1 CAPSULE (180 MG TOTAL) BY MOUTH DAILY. -     Dulaglutide (TRULICITY) 1.97 JO/8.3GP SOPN; INJECT 0.75 MG INTO THE SKIN ONCE A WEEK. -     gabapentin (NEURONTIN) 300 MG capsule; 1 at bedtime for1 week then 2  The next  week then 3 the next week then 4 daily.    Virtual Visit via telephone Note  I discussed the limitations, risks, security and privacy concerns of performing an evaluation and management service by telephone and the availability of in person appointments. The patient was identified with two identifiers. Pt.expressed understanding and agreed to proceed. Pt. Is at home. Dr. Livia Snellen is in his office.  Follow Up Instructions:   I discussed the assessment and treatment plan with the patient. The patient was provided an opportunity to ask questions and all were answered. The patient agreed with the plan and demonstrated an understanding of the instructions.   The patient was advised to call back or seek an in-person evaluation if the symptoms worsen or if the condition fails to improve as anticipated.   Total minutes including chart review and phone contact time: 27   Follow up plan: Return in about 3 months (around 09/15/2019).  Claretta Fraise, MD [place]

## 2019-06-18 DIAGNOSIS — S41109A Unspecified open wound of unspecified upper arm, initial encounter: Secondary | ICD-10-CM | POA: Diagnosis not present

## 2019-06-22 ENCOUNTER — Encounter (HOSPITAL_COMMUNITY): Payer: Self-pay | Admitting: Emergency Medicine

## 2019-06-22 ENCOUNTER — Inpatient Hospital Stay (HOSPITAL_COMMUNITY)
Admission: EM | Admit: 2019-06-22 | Discharge: 2019-06-24 | DRG: 069 | Disposition: A | Discharge status: Home Health | Payer: Medicare HMO | Attending: Internal Medicine | Admitting: Internal Medicine

## 2019-06-22 ENCOUNTER — Emergency Department (HOSPITAL_COMMUNITY): Payer: Medicare HMO

## 2019-06-22 ENCOUNTER — Other Ambulatory Visit: Payer: Self-pay

## 2019-06-22 DIAGNOSIS — Z7901 Long term (current) use of anticoagulants: Secondary | ICD-10-CM

## 2019-06-22 DIAGNOSIS — I509 Heart failure, unspecified: Secondary | ICD-10-CM

## 2019-06-22 DIAGNOSIS — G4733 Obstructive sleep apnea (adult) (pediatric): Secondary | ICD-10-CM | POA: Diagnosis present

## 2019-06-22 DIAGNOSIS — J9610 Chronic respiratory failure, unspecified whether with hypoxia or hypercapnia: Secondary | ICD-10-CM | POA: Diagnosis present

## 2019-06-22 DIAGNOSIS — E785 Hyperlipidemia, unspecified: Secondary | ICD-10-CM | POA: Diagnosis present

## 2019-06-22 DIAGNOSIS — I130 Hypertensive heart and chronic kidney disease with heart failure and stage 1 through stage 4 chronic kidney disease, or unspecified chronic kidney disease: Secondary | ICD-10-CM | POA: Diagnosis present

## 2019-06-22 DIAGNOSIS — I110 Hypertensive heart disease with heart failure: Secondary | ICD-10-CM | POA: Diagnosis not present

## 2019-06-22 DIAGNOSIS — N183 Chronic kidney disease, stage 3 unspecified: Secondary | ICD-10-CM

## 2019-06-22 DIAGNOSIS — I270 Primary pulmonary hypertension: Secondary | ICD-10-CM | POA: Diagnosis not present

## 2019-06-22 DIAGNOSIS — I371 Nonrheumatic pulmonary valve insufficiency: Secondary | ICD-10-CM | POA: Diagnosis not present

## 2019-06-22 DIAGNOSIS — I4821 Permanent atrial fibrillation: Secondary | ICD-10-CM | POA: Diagnosis not present

## 2019-06-22 DIAGNOSIS — E11649 Type 2 diabetes mellitus with hypoglycemia without coma: Secondary | ICD-10-CM | POA: Diagnosis present

## 2019-06-22 DIAGNOSIS — J9600 Acute respiratory failure, unspecified whether with hypoxia or hypercapnia: Secondary | ICD-10-CM | POA: Diagnosis not present

## 2019-06-22 DIAGNOSIS — G459 Transient cerebral ischemic attack, unspecified: Secondary | ICD-10-CM | POA: Diagnosis not present

## 2019-06-22 DIAGNOSIS — R069 Unspecified abnormalities of breathing: Secondary | ICD-10-CM | POA: Diagnosis not present

## 2019-06-22 DIAGNOSIS — E875 Hyperkalemia: Secondary | ICD-10-CM | POA: Diagnosis present

## 2019-06-22 DIAGNOSIS — Z9981 Dependence on supplemental oxygen: Secondary | ICD-10-CM

## 2019-06-22 DIAGNOSIS — J449 Chronic obstructive pulmonary disease, unspecified: Secondary | ICD-10-CM | POA: Diagnosis present

## 2019-06-22 DIAGNOSIS — I5033 Acute on chronic diastolic (congestive) heart failure: Secondary | ICD-10-CM | POA: Diagnosis not present

## 2019-06-22 DIAGNOSIS — J4551 Severe persistent asthma with (acute) exacerbation: Secondary | ICD-10-CM | POA: Diagnosis not present

## 2019-06-22 DIAGNOSIS — R0600 Dyspnea, unspecified: Secondary | ICD-10-CM | POA: Diagnosis present

## 2019-06-22 DIAGNOSIS — Z79891 Long term (current) use of opiate analgesic: Secondary | ICD-10-CM

## 2019-06-22 DIAGNOSIS — I6523 Occlusion and stenosis of bilateral carotid arteries: Secondary | ICD-10-CM | POA: Diagnosis not present

## 2019-06-22 DIAGNOSIS — D631 Anemia in chronic kidney disease: Secondary | ICD-10-CM | POA: Diagnosis present

## 2019-06-22 DIAGNOSIS — J9811 Atelectasis: Secondary | ICD-10-CM | POA: Diagnosis not present

## 2019-06-22 DIAGNOSIS — Z79899 Other long term (current) drug therapy: Secondary | ICD-10-CM

## 2019-06-22 DIAGNOSIS — I361 Nonrheumatic tricuspid (valve) insufficiency: Secondary | ICD-10-CM | POA: Diagnosis not present

## 2019-06-22 DIAGNOSIS — E1122 Type 2 diabetes mellitus with diabetic chronic kidney disease: Secondary | ICD-10-CM | POA: Diagnosis present

## 2019-06-22 DIAGNOSIS — Z8673 Personal history of transient ischemic attack (TIA), and cerebral infarction without residual deficits: Secondary | ICD-10-CM

## 2019-06-22 DIAGNOSIS — Z833 Family history of diabetes mellitus: Secondary | ICD-10-CM

## 2019-06-22 DIAGNOSIS — R29810 Facial weakness: Secondary | ICD-10-CM | POA: Diagnosis present

## 2019-06-22 DIAGNOSIS — I2510 Atherosclerotic heart disease of native coronary artery without angina pectoris: Secondary | ICD-10-CM | POA: Diagnosis present

## 2019-06-22 DIAGNOSIS — E039 Hypothyroidism, unspecified: Secondary | ICD-10-CM | POA: Diagnosis present

## 2019-06-22 DIAGNOSIS — I272 Pulmonary hypertension, unspecified: Secondary | ICD-10-CM | POA: Diagnosis not present

## 2019-06-22 DIAGNOSIS — R29818 Other symptoms and signs involving the nervous system: Secondary | ICD-10-CM | POA: Diagnosis not present

## 2019-06-22 DIAGNOSIS — G8191 Hemiplegia, unspecified affecting right dominant side: Secondary | ICD-10-CM | POA: Diagnosis present

## 2019-06-22 DIAGNOSIS — K219 Gastro-esophageal reflux disease without esophagitis: Secondary | ICD-10-CM | POA: Diagnosis present

## 2019-06-22 DIAGNOSIS — N179 Acute kidney failure, unspecified: Secondary | ICD-10-CM | POA: Diagnosis not present

## 2019-06-22 DIAGNOSIS — H538 Other visual disturbances: Secondary | ICD-10-CM | POA: Diagnosis not present

## 2019-06-22 DIAGNOSIS — Z209 Contact with and (suspected) exposure to unspecified communicable disease: Secondary | ICD-10-CM | POA: Diagnosis not present

## 2019-06-22 DIAGNOSIS — R42 Dizziness and giddiness: Secondary | ICD-10-CM | POA: Diagnosis not present

## 2019-06-22 DIAGNOSIS — M810 Age-related osteoporosis without current pathological fracture: Secondary | ICD-10-CM | POA: Diagnosis present

## 2019-06-22 DIAGNOSIS — Z6841 Body Mass Index (BMI) 40.0 and over, adult: Secondary | ICD-10-CM | POA: Diagnosis not present

## 2019-06-22 DIAGNOSIS — I491 Atrial premature depolarization: Secondary | ICD-10-CM | POA: Diagnosis not present

## 2019-06-22 DIAGNOSIS — Z841 Family history of disorders of kidney and ureter: Secondary | ICD-10-CM

## 2019-06-22 DIAGNOSIS — Z9049 Acquired absence of other specified parts of digestive tract: Secondary | ICD-10-CM

## 2019-06-22 DIAGNOSIS — Z794 Long term (current) use of insulin: Secondary | ICD-10-CM

## 2019-06-22 DIAGNOSIS — Z20828 Contact with and (suspected) exposure to other viral communicable diseases: Secondary | ICD-10-CM | POA: Diagnosis present

## 2019-06-22 DIAGNOSIS — Z79890 Hormone replacement therapy (postmenopausal): Secondary | ICD-10-CM

## 2019-06-22 DIAGNOSIS — Z981 Arthrodesis status: Secondary | ICD-10-CM

## 2019-06-22 DIAGNOSIS — J90 Pleural effusion, not elsewhere classified: Secondary | ICD-10-CM | POA: Diagnosis not present

## 2019-06-22 DIAGNOSIS — R0602 Shortness of breath: Secondary | ICD-10-CM | POA: Diagnosis not present

## 2019-06-22 DIAGNOSIS — E1121 Type 2 diabetes mellitus with diabetic nephropathy: Secondary | ICD-10-CM | POA: Diagnosis not present

## 2019-06-22 DIAGNOSIS — Z8249 Family history of ischemic heart disease and other diseases of the circulatory system: Secondary | ICD-10-CM

## 2019-06-22 LAB — CBC WITH DIFFERENTIAL/PLATELET
Abs Immature Granulocytes: 0.02 10*3/uL (ref 0.00–0.07)
Basophils Absolute: 0 10*3/uL (ref 0.0–0.1)
Basophils Relative: 0 %
Eosinophils Absolute: 0.1 10*3/uL (ref 0.0–0.5)
Eosinophils Relative: 1 %
HCT: 33 % — ABNORMAL LOW (ref 36.0–46.0)
Hemoglobin: 9.7 g/dL — ABNORMAL LOW (ref 12.0–15.0)
Immature Granulocytes: 0 %
Lymphocytes Relative: 12 %
Lymphs Abs: 0.7 10*3/uL (ref 0.7–4.0)
MCH: 30.6 pg (ref 26.0–34.0)
MCHC: 29.4 g/dL — ABNORMAL LOW (ref 30.0–36.0)
MCV: 104.1 fL — ABNORMAL HIGH (ref 80.0–100.0)
Monocytes Absolute: 0.5 10*3/uL (ref 0.1–1.0)
Monocytes Relative: 8 %
Neutro Abs: 4.5 10*3/uL (ref 1.7–7.7)
Neutrophils Relative %: 79 %
Platelets: 107 10*3/uL — ABNORMAL LOW (ref 150–400)
RBC: 3.17 MIL/uL — ABNORMAL LOW (ref 3.87–5.11)
RDW: 14.6 % (ref 11.5–15.5)
WBC: 5.8 10*3/uL (ref 4.0–10.5)
nRBC: 0 % (ref 0.0–0.2)

## 2019-06-22 LAB — COMPREHENSIVE METABOLIC PANEL
ALT: 18 U/L (ref 0–44)
AST: 16 U/L (ref 15–41)
Albumin: 3.6 g/dL (ref 3.5–5.0)
Alkaline Phosphatase: 170 U/L — ABNORMAL HIGH (ref 38–126)
Anion gap: 7 (ref 5–15)
BUN: 32 mg/dL — ABNORMAL HIGH (ref 8–23)
CO2: 25 mmol/L (ref 22–32)
Calcium: 8.7 mg/dL — ABNORMAL LOW (ref 8.9–10.3)
Chloride: 106 mmol/L (ref 98–111)
Creatinine, Ser: 1.75 mg/dL — ABNORMAL HIGH (ref 0.44–1.00)
GFR calc Af Amer: 32 mL/min — ABNORMAL LOW (ref >60)
GFR calc non Af Amer: 28 mL/min — ABNORMAL LOW (ref >60)
Glucose, Bld: 168 mg/dL — ABNORMAL HIGH (ref 70–99)
Potassium: 5.2 mmol/L — ABNORMAL HIGH (ref 3.5–5.1)
Sodium: 138 mmol/L (ref 135–145)
Total Bilirubin: 1 mg/dL (ref 0.3–1.2)
Total Protein: 6.8 g/dL (ref 6.5–8.1)

## 2019-06-22 LAB — BRAIN NATRIURETIC PEPTIDE: B Natriuretic Peptide: 187 pg/mL — ABNORMAL HIGH (ref 0.0–100.0)

## 2019-06-22 LAB — GLUCOSE, CAPILLARY: Glucose-Capillary: 133 mg/dL — ABNORMAL HIGH (ref 70–99)

## 2019-06-22 LAB — TROPONIN I (HIGH SENSITIVITY)
Troponin I (High Sensitivity): 6 ng/L (ref <18)
Troponin I (High Sensitivity): 6 ng/L (ref <18)

## 2019-06-22 LAB — URINALYSIS, ROUTINE W REFLEX MICROSCOPIC
Bilirubin Urine: NEGATIVE
Glucose, UA: NEGATIVE mg/dL
Hgb urine dipstick: NEGATIVE
Ketones, ur: NEGATIVE mg/dL
Leukocytes,Ua: NEGATIVE
Nitrite: NEGATIVE
Protein, ur: NEGATIVE mg/dL
Specific Gravity, Urine: 1.008 (ref 1.005–1.030)
pH: 5 (ref 5.0–8.0)

## 2019-06-22 LAB — SARS CORONAVIRUS 2 BY RT PCR (HOSPITAL ORDER, PERFORMED IN ~~LOC~~ HOSPITAL LAB): SARS Coronavirus 2: NEGATIVE

## 2019-06-22 MED ORDER — INSULIN DETEMIR 100 UNIT/ML ~~LOC~~ SOLN
20.0000 [IU] | Freq: Two times a day (BID) | SUBCUTANEOUS | Status: DC
  Administered 2019-06-22 – 2019-06-24 (×3): 20 [IU] via SUBCUTANEOUS
  Filled 2019-06-22 (×11): qty 0.2

## 2019-06-22 MED ORDER — IPRATROPIUM-ALBUTEROL 0.5-2.5 (3) MG/3ML IN SOLN: 3.0000 mL | Freq: Four times a day (QID) | RESPIRATORY_TRACT | Status: DC | PRN

## 2019-06-22 MED ORDER — APIXABAN 2.5 MG PO TABS
2.5000 mg | ORAL_TABLET | Freq: Two times a day (BID) | ORAL | Status: DC
  Administered 2019-06-22 – 2019-06-24 (×4): 2.5 mg via ORAL
  Filled 2019-06-22 (×4): qty 1

## 2019-06-22 MED ORDER — FUROSEMIDE 10 MG/ML IJ SOLN
60.0000 mg | Freq: Once | INTRAMUSCULAR | Status: AC
  Administered 2019-06-22: 20:00:00 60 mg via INTRAVENOUS
  Filled 2019-06-22: qty 6

## 2019-06-22 MED ORDER — ONDANSETRON HCL 4 MG/2ML IJ SOLN: 4.0000 mg | Freq: Four times a day (QID) | INTRAMUSCULAR | Status: DC | PRN

## 2019-06-22 MED ORDER — GABAPENTIN 300 MG PO CAPS
300.0000 mg | ORAL_CAPSULE | Freq: Four times a day (QID) | ORAL | Status: DC
  Administered 2019-06-22 – 2019-06-24 (×7): 300 mg via ORAL
  Filled 2019-06-22 (×7): qty 1

## 2019-06-22 MED ORDER — TRAZODONE HCL 50 MG PO TABS
150.0000 mg | ORAL_TABLET | Freq: Every day | ORAL | Status: DC
  Administered 2019-06-22 – 2019-06-23 (×2): 150 mg via ORAL
  Filled 2019-06-22 (×2): qty 3

## 2019-06-22 MED ORDER — ACETAMINOPHEN 325 MG PO TABS: 650.0000 mg | ORAL_TABLET | Freq: Four times a day (QID) | ORAL | Status: DC | PRN

## 2019-06-22 MED ORDER — ONDANSETRON HCL 4 MG PO TABS
4.0000 mg | ORAL_TABLET | Freq: Four times a day (QID) | ORAL | Status: DC | PRN
  Administered 2019-06-24: 4 mg via ORAL
  Filled 2019-06-22: qty 1

## 2019-06-22 MED ORDER — LEVOTHYROXINE SODIUM 50 MCG PO TABS
50.0000 ug | ORAL_TABLET | Freq: Every day | ORAL | Status: DC
  Administered 2019-06-23 – 2019-06-24 (×2): 50 ug via ORAL
  Filled 2019-06-22 (×2): qty 1

## 2019-06-22 MED ORDER — ACETAMINOPHEN 650 MG RE SUPP: 650.0000 mg | Freq: Four times a day (QID) | RECTAL | Status: DC | PRN

## 2019-06-22 MED ORDER — FUROSEMIDE 10 MG/ML IJ SOLN
40.0000 mg | Freq: Two times a day (BID) | INTRAMUSCULAR | Status: DC
  Administered 2019-06-23 – 2019-06-24 (×3): 40 mg via INTRAVENOUS
  Filled 2019-06-22 (×3): qty 4

## 2019-06-22 MED ORDER — INSULIN ASPART 100 UNIT/ML ~~LOC~~ SOLN
0.0000 [IU] | Freq: Three times a day (TID) | SUBCUTANEOUS | Status: DC
  Administered 2019-06-23: 17:00:00 2 [IU] via SUBCUTANEOUS
  Administered 2019-06-23: 11:00:00 3 [IU] via SUBCUTANEOUS
  Administered 2019-06-23: 08:00:00 2 [IU] via SUBCUTANEOUS
  Administered 2019-06-24: 13:00:00 3 [IU] via SUBCUTANEOUS
  Administered 2019-06-24: 2 [IU] via SUBCUTANEOUS

## 2019-06-22 MED ORDER — POLYETHYLENE GLYCOL 3350 17 G PO PACK: 17.0000 g | PACK | Freq: Every day | ORAL | Status: DC | PRN

## 2019-06-22 MED ORDER — DILTIAZEM HCL ER COATED BEADS 180 MG PO CP24
180.0000 mg | ORAL_CAPSULE | Freq: Every morning | ORAL | Status: DC
  Administered 2019-06-23: 11:00:00 180 mg via ORAL
  Filled 2019-06-22 (×2): qty 1

## 2019-06-22 NOTE — Progress Notes (Signed)
Reported to k schorr via [employer] system pt had 2.19 second pause on telemetry.

## 2019-06-22 NOTE — H&P (Addendum)
History and Physical    [name]:[id number] DOB: 1946/04/06 DOA: 06/22/2019  PCP: Claretta Fraise, MD   Patient coming from: Home  I have personally briefly reviewed patient's old medical records in [place]  Chief Complaint: Dyspnea on exertion, facial droop with blurred vision, chest pain  HPI: [name] is a 73 y.o. female with medical history significant for pulmonary hypertension, CHF, diabetes mellitus, atrial fibrillation, OSA and asthma, CVA, coronary artery disease, obesity BMI 46, patient presented to the ED with multiple complaints. Patient tells me last night and today her daughter noticed that the right side of her face was droopy.  This lasted about 10 minutes.  She reports that the same pain that she had left upper and lower extremity weakness, at this time she feels that her left upper extremity still weak.  She also reports blurred vision over the past 2 days, intermittent bouts currently she has had blurred vision involving her 2 eyes for the past 24 hours.  She denies slurred speech. Patient also reports chest pain with exertion, that improves with rest.  Chest pain radiates down her left arm.  She describes the pain as an numbness. She denies difficulty breathing at rest or with exertion.  She has chronic lower extremity swelling that is unchanged.  She does not weigh herself daily but she tells me her regular weight is 127 kg.  She reports compliance with her torsemide, low-salt diet and fluid restriction.  Denies vomiting or loose stools, and has maintained good p.o. intake.  No fever or chills.  She is on 2 L of O2 mostly at night.  ED Course: Heart rate 50s to 60s, blood pressure systolic 094B to 096G, O2 sats 96% on nasal cannula.  Creatinine elevated 1.75, baseline 1.1-1.2.  Potassium elevated 5.2.  Chronic anemia hemoglobin 9.7.  Mildly low platelets 107.  BNP elevated 187. T waves appear peaked but last EKG was from 2018.  Head CT shows mild to  moderate chronic ischemic changes.  There is new mild interstitial pulmonary edema with slightly increased small right pleural effusion.  New trace left pleural effusion. Hospitalist to admit for decompensated CHF and TIA/stroke work-up.  Review of Systems: As per HPI all other systems reviewed and negative.  Past Medical History:  Diagnosis Date  . Anemia   . Arthritis   . Asthma   . Atrial fibrillation ([place])   . CAD (coronary artery disease)    LAD 60% proximal stenosis mid and distal 60% stenosis 2009.  . Cataract    had surgery  . CHF (congestive heart failure) ([place])   . COPD (chronic obstructive pulmonary disease) ([place])   . Diabetes mellitus   . Gait instability    uses cane  . GERD (gastroesophageal reflux disease)   . Hyperlipidemia   . Hypertension   . Obesity   . Oxygen dependent    2 liter per nasal cannula  . Sleep apnea, obstructive 2008  . Stroke [facility])    2 mini    Past Surgical History:  Procedure Laterality Date  . APPENDECTOMY    . CARPAL TUNNEL RELEASE     right hand  . CATARACT EXTRACTION W/ INTRAOCULAR LENS  IMPLANT, BILATERAL    . CERVICAL SPINE SURGERY     fusion  . CESAREAN SECTION    . CHOLECYSTECTOMY    . COLONOSCOPY  11/22/2002   normal - Dr.Rrourk  . COLONOSCOPY  11/26/2005   incomplete with negative BE (Dr. Rowe Pavy,  [place])  . EYE SURGERY    . FEMUR IM NAIL Left 05/08/2013   Procedure: INTRAMEDULLARY (IM) NAIL FEMORAL--LONG(BIOMET SYSTEM) and Zimmer Cables;  Surgeon: Augustin Schooling, MD;  Location: [facility];  Service: Orthopedics;  Laterality: Left;  . HIP SURGERY Left   . KNEE ARTHROSCOPY     twice, left  . UMBILICAL HERNIA REPAIR    . UPPER GASTROINTESTINAL ENDOSCOPY  11/22/2002   Normal - Dr. Gala Romney     reports that she has never smoked. She has never used smokeless tobacco. She reports that she does not drink alcohol or use drugs.  Allergies  Allergen Reactions  . Lisinopril     R/t to kidney  . Codeine Nausea And Vomiting     Family History  Problem Relation Age of Onset  . Colon cancer Mother   . Heart attack Mother   . Alzheimer's disease Mother        father  . Diabetes Mother   . Heart disease Mother   . Cancer Mother        colon  . Kidney disease Mother   . Cancer Sister        brain and spine  . Migraines Sister   . Alzheimer's disease Father   . Cancer Brother        stomach  . Diabetes Sister   . Cancer Sister        breast  . Heart disease Sister   . Heart attack Sister   . Breast cancer Sister   . Heart disease Brother   . Heart murmur Brother   . Diabetes Other   . Heart attack Other   . Diabetes Daughter   . Asthma Daughter   . Asthma Grandchild     Prior to Admission medications   Medication Sig Start Date End Date Taking? Authorizing Provider  acetaminophen (TYLENOL) 500 MG tablet Take 500 mg by mouth every 4 (four) hours as needed.    Yes [provider]  albuterol (PROVENTIL HFA;VENTOLIN HFA) 108 (90 Base) MCG/ACT inhaler Inhale 2 puffs into the lungs every 6 (six) hours as needed for wheezing or shortness of breath. 12/20/18  Yes Claretta Fraise, MD  alendronate (FOSAMAX) 70 MG tablet Take 1 tablet (70 mg total) by mouth every 7 (seven) days. Patient taking differently: Take 70 mg by mouth every Friday.  06/15/19  Yes Stacks, Cletus Gash, MD  apixaban (ELIQUIS) 2.5 MG TABS tablet Take 1 tablet (2.5 mg total) by mouth 2 (two) times daily. 06/15/19  Yes Stacks, Cletus Gash, MD  diltiazem (CARDIZEM CD) 180 MG 24 hr capsule TAKE 1 CAPSULE (180 MG TOTAL) BY MOUTH DAILY. Patient taking differently: Take 180 mg by mouth every morning.  06/15/19  Yes Claretta Fraise, MD  Dulaglutide (TRULICITY) 4.40 HK/7.4QV SOPN INJECT 0.75 MG INTO THE SKIN ONCE A WEEK. Patient taking differently: Inject 0.75 mg into the skin every Monday.  06/15/19  Yes Claretta Fraise, MD  ferrous sulfate 325 (65 FE) MG tablet Take 1 tablet (325 mg total) by mouth daily with breakfast. 06/15/19  Yes Stacks, Cletus Gash, MD  fish  oil-omega-3 fatty acids 1000 MG capsule Take 2 g by mouth daily.     Yes [provider]  fluocinonide-emollient (LIDEX-E) 0.05 % cream Apply 1 application topically 2 (two) times daily. To affected areas Patient taking differently: Apply 1 application topically 2 (two) times daily. To affected areas ON FEET 03/21/19  Yes Stacks, Cletus Gash, MD  gabapentin (NEURONTIN) 300 MG capsule 1 at  bedtime for1 week then 2  The next  week then 3 the next week then 4 daily. Patient taking differently: Take 300 mg by mouth 4 (four) times daily.  06/15/19  Yes Stacks, Cletus Gash, MD  Insulin Detemir (LEVEMIR FLEXTOUCH) 100 UNIT/ML Pen Inject 40 Units into the skin daily with breakfast. INJECT 70 UNITS INTO THE SKIN DAILY WITH BREAKFAST. Patient taking differently: Inject 30 Units into the skin 2 (two) times a day.  06/15/19  Yes Stacks, Cletus Gash, MD  ipratropium-albuterol (DUONEB) 0.5-2.5 (3) MG/3ML SOLN Inhale 3 mLs into the lungs every 6 (six) hours as needed (FOR SHORTNESS OF BREATH).  01/14/18  Yes [provider]  levothyroxine (SYNTHROID) 50 MCG tablet Take 1 tablet (50 mcg total) by mouth daily. 06/15/19  Yes Stacks, Cletus Gash, MD  losartan (COZAAR) 50 MG tablet Take 1 tablet (50 mg total) by mouth daily. Patient taking differently: Take 50 mg by mouth every evening.  06/15/19  Yes Stacks, Cletus Gash, MD  metFORMIN (GLUCOPHAGE-XR) 750 MG 24 hr tablet TAKE 1 TABLET BY MOUTH EVERY DAY WITH BREAKFAST Patient taking differently: Take 750 mg by mouth every morning.  06/15/19  Yes StacksCletus Gash, MD  OXYGEN Inhale 2 L into the lungs continuous.   Yes [provider]  potassium chloride SA (KLOR-CON M20) 20 MEQ tablet Take 1 tablet (20 mEq total) by mouth daily. 06/15/19  Yes Claretta Fraise, MD  torsemide (DEMADEX) 20 MG tablet Take 2 tablets (40 mg total) by mouth daily. Patient taking differently: Take 20 mg by mouth 2 (two) times daily.  06/15/19  Yes Claretta Fraise, MD  traZODone (DESYREL) 300 MG tablet Take  1 tablet (300 mg total) by mouth at bedtime. 06/15/19  Yes Claretta Fraise, MD    Physical Exam: Vitals:   06/22/19 1600 06/22/19 1615 06/22/19 1630 06/22/19 1645  BP: 131/82  (!) 133/47   Pulse: 65 (!) 56 63 61  Resp: 17 17 12 12   Temp:      TempSrc:      SpO2: 100% 100% 100% 99%  Weight:      Height:        Constitutional: NAD, calm, comfortable Vitals:   06/22/19 1600 06/22/19 1615 06/22/19 1630 06/22/19 1645  BP: 131/82  (!) 133/47   Pulse: 65 (!) 56 63 61  Resp: 17 17 12 12   Temp:      TempSrc:      SpO2: 100% 100% 100% 99%  Weight:      Height:       Eyes: PERRL, lids and conjunctivae normal ENMT: Mucous membranes are moist. Posterior pharynx clear of any exudate or lesions.  Neck: normal, supple, no masses, no thyromegaly Respiratory: clear to auscultation bilaterally, no wheezing, no crackles. Normal respiratory effort. No accessory muscle use.  Cardiovascular: Regular rate and rhythm, no murmurs / rubs / gallops.  Bilateral chronic non pitting pedal edema, with woody induration and chronic stasis changes.  2+ pedal pulses.  Abdomen: no tenderness, no masses palpated. No hepatosplenomegaly. Bowel sounds positive.  Musculoskeletal: no clubbing / cyanosis. No joint deformity upper and lower extremities. Good ROM, no contractures. Normal muscle tone.  Skin: no rashes, lesions, ulcers. No induration Neurologic: CN 2-12 grossly intact. Sensation intact, DTR normal. Strength 5/5 in all 4.  In both right and left eyes, she is able to see me clearly, count my fingers correctly. Psychiatric: Normal judgment and insight. Alert and oriented x 3. Normal mood.   Labs on Admission: I have personally reviewed following  labs and imaging studies  CBC: Recent Labs  Lab 06/22/19 1452  WBC 5.8  NEUTROABS 4.5  HGB 9.7*  HCT 33.0*  MCV 104.1*  PLT 326*   Basic Metabolic Panel: Recent Labs  Lab 06/22/19 1452  NA 138  K 5.2*  CL 106  CO2 25  GLUCOSE 168*  BUN 32*   CREATININE 1.75*  CALCIUM 8.7*   Liver Function Tests: Recent Labs  Lab 06/22/19 1452  AST 16  ALT 18  ALKPHOS 170*  BILITOT 1.0  PROT 6.8  ALBUMIN 3.6   Urine analysis:    Component Value Date/Time   COLORURINE STRAW (A) 06/22/2019 1414   APPEARANCEUR CLEAR 06/22/2019 1414   APPEARANCEUR Clear 05/28/2018 1455   LABSPEC 1.008 06/22/2019 1414   PHURINE 5.0 06/22/2019 1414   GLUCOSEU NEGATIVE 06/22/2019 1414   HGBUR NEGATIVE 06/22/2019 1414   BILIRUBINUR NEGATIVE 06/22/2019 1414   BILIRUBINUR Negative 05/28/2018 1455   KETONESUR NEGATIVE 06/22/2019 1414   PROTEINUR NEGATIVE 06/22/2019 1414   UROBILINOGEN negative 12/23/2013 1057   UROBILINOGEN 0.2 05/06/2013 2213   NITRITE NEGATIVE 06/22/2019 1414   LEUKOCYTESUR NEGATIVE 06/22/2019 1414    Radiological Exams on Admission: Dg Chest 2 View  Result Date: 06/22/2019 CLINICAL DATA:  Shortness of breath with exertion. EXAM: CHEST - 2 VIEW COMPARISON:  Chest x-ray dated January 21, 2018. FINDINGS: Stable cardiomegaly. Chronically coarsened interstitial markings with new superimposed mid to lower lobe predominant interstitial thickening. Slightly increased small right pleural effusion with worsening right basilar atelectasis. New trace left pleural effusion. No pneumothorax. No acute osseous abnormality. IMPRESSION: 1. New mild interstitial pulmonary edema with slightly increased small right pleural effusion. New trace left pleural effusion. Electronically Signed   By: Titus Dubin M.D.   On: 06/22/2019 15:05   Ct Head Wo Contrast  Result Date: 06/22/2019 CLINICAL DATA:  Facial droop last night. Blurry vision and dizziness. EXAM: CT HEAD WITHOUT CONTRAST TECHNIQUE: Contiguous axial images were obtained from the base of the skull through the vertex without intravenous contrast. COMPARISON:  CT head dated November 20, 2008. FINDINGS: Brain: No evidence of acute infarction, hemorrhage, hydrocephalus, extra-axial collection or mass  lesion/mass effect. Slightly progressed mild to moderate chronic microvascular ischemic changes. Vascular: Calcified atherosclerosis at the skullbase. No hyperdense vessel. Skull: Negative for fracture or focal lesion. Sinuses/Orbits: No acute finding. Other: None. IMPRESSION: 1.  No acute intracranial abnormality. 2. Slightly progressed mild to moderate chronic microvascular ischemic changes. Electronically Signed   By: Titus Dubin M.D.   On: 06/22/2019 15:09    EKG: Independently reviewed.  Atrial fibrillation, rate 88, QTc of 370.  Tall- appearing peaked T waves in leads II, III, aVF.  But last EKG to compare is from 2018.  Assessment/Plan Active Problems:   Dyspnea    Decompensated diastolic CHF-appears mild, chest x-ray shows mild interstitial pulmonary edema (on my view chest x-ray appears quite congested compared to prior) increased right pleural effusion and new trace left pleural effusion.  Reported dyspnea on exertion, but denies to me.  On exam its hard to tell if she is volume overloaded as she has chronic lower extremity swelling with woody induration.  Weights per chart are stable.  BNP mildly elevated 187, prior elevations to 227 3 years ago, patient is obese. Appears last echo on file was 2020, but I am unable to view the results, no results in care everywhere.. Reports compliance with Home medications torsemide 40 mg daily, fluid and salt restriction. -Strict input output -Daily weights -  IV Lasix 40 every 12 hourly -Updated echocardiogram -IV Lasix 60 mg x 1, then 40 IV twice daily  Facial droop/blurred vision/left extremity weakness- exam without objective focal deficits, though she reports ongoing blurry vision bilaterally and upper extremity weakness. Head CT shows slightly progressed mild to moderate chronic microvascular ischemic changes.   - MRI already ordered and done in the ED-negative for acute infarct.  Moderate chronic microvascular ischemic changes in the white  matter. -Echocardiogram -Carotid Dopplers -PT evaluation -Stroke swallow evaluation -Lipid panel, HGbA1c a.m  Acute kidney injury-creatinine 1.75, baseline 1-1.1.  Likely cardiorenal. -BMP a.m. creatinine continues to increase then will need to DC and scale back on diuretics. -Diuresis IV Lasix , hold home losartan for now -Hold home losartan  Chest pain with history of nonobstructive CAD-follows with Dr. Percival Spanish.  Per last Tele-visit, patient had stress test in 2013-unable to view results.  Per notes patient has hx of nonobstructive coronary artery disease.  High Sensitive to troponin x2 unremarkable.  EKG with tall T waves, otherwise no significant change from prior. -Continue home Xarelto -Follow-up with cardiology as outpatient.  Atrial fibrillation-rate controlled 60s to 70s and anticoagulated. -Continue home Eliquis, Cardizem  Diabetes mellitus-random glucose 168. 12/2018- HGBA1c 6.8.  On Levemir and once weekly (Monday) dulaglutide injection. - SSI-m -Continue home Levemir at reduced dose 20 units bid -Hold home metformin  Pulmonary hypertension, obesity- BMI 46.  She is on chronic O2, 2L -Diuresis  Chronically elevated ALP-170 today.  Abdomen benign.  No recent abdominal imaging. -Follow-up as outpatient.  Hyperkalemia-5.2, likely due to potassium supplementation.  EKG shows peaked T waves, both prior EKGs from 2018.  Doubt Potassium of 5.2 is likely to cause such EKG changes. -Hold home K supplementation -Hold home losartan   DVT prophylaxis: Eliquis Code Status: Full  Family Communication: None at bedside Disposition Plan: Per rounding team Consults called: None Admission status: Observation, telemetry I certify that at the point of admission it is my clinical judgment that the patient will require inpatient hospital care spanning beyond 2 midnights from the point of admission due to high intensity of service, high risk for further deterioration and high frequency  of surveillance required. The following factors support the patient status of inpatient:    Bethena Roys MD [facility]  06/22/2019, 5:28 PM

## 2019-06-22 NOTE — ED Provider Notes (Signed)
[facility] EMERGENCY DEPARTMENT Provider Note   CSN: [id number] Arrival date & time: 06/22/19  1235    History   Chief Complaint Chief Complaint  Patient presents with  . Shortness of Breath  . Dizziness    HPI [name] is a 73 y.o. female with history of CAD, CHF, COPD, diabetes, atrial fibrillation anticoagulated on Eliquis, GERD, hypertension, hyperlipidemia who presents with several day history of shortness of breath on exertion as well as associated chest pain.  Patient reports some intermittent dizziness as well as some blurred vision that has been intermittent for the past few days.  Patient reports she is feeling shaky in her hands bilaterally.  She denies any cough, fever, abdominal pain, nausea, vomiting, urinary symptoms.  Patient describes her chest pain is worse with walking and getting up from a sitting position.  It is more of a heaviness sensation.     HPI  Past Medical History:  Diagnosis Date  . Anemia   . Arthritis   . Asthma   . Atrial fibrillation ([place])   . CAD (coronary artery disease)    LAD 60% proximal stenosis mid and distal 60% stenosis 2009.  . Cataract    had surgery  . CHF (congestive heart failure) ([place])   . COPD (chronic obstructive pulmonary disease) ([place])   . Diabetes mellitus   . Gait instability    uses cane  . GERD (gastroesophageal reflux disease)   . Hyperlipidemia   . Hypertension   . Obesity   . Oxygen dependent    2 liter per nasal cannula  . Sleep apnea, obstructive 2008  . Stroke [facility])    2 mini    Patient Active Problem List   Diagnosis Date Noted  . Educated About Covid-19 Virus Infection 04/12/2019  . Leg swelling 04/12/2019  . Coronary artery disease involving native coronary artery of native heart without angina pectoris 04/12/2019  . Hyperlipidemia 02/23/2018  . Current use of long term anticoagulation 08/26/2017  . Hypothyroidism 02/04/2017  . Long-term current use of insulin for diabetes mellitus  ([place]) 08/15/2015  . Chronic renal insufficiency 08/15/2015  . Diabetic nephropathy associated with type 2 diabetes mellitus ([place]) 08/15/2015  . Osteoporosis 02/21/2015  . Normocytic anemia due to blood loss 05/09/2013  . Thrombocytopenia, unspecified ([place]) 05/09/2013  . Severe obesity (BMI >= 40) ([place]) 02/28/2008  . PULMONARY HYPERTENSION 02/28/2008  . Congestive heart failure ([place]) 02/28/2008  . PULMONARY NODULE, LEFT UPPER LOBE 02/28/2008  . OBSTRUCTIVE SLEEP APNEA 02/28/2008    Past Surgical History:  Procedure Laterality Date  . APPENDECTOMY    . CARPAL TUNNEL RELEASE     right hand  . CATARACT EXTRACTION W/ INTRAOCULAR LENS  IMPLANT, BILATERAL    . CERVICAL SPINE SURGERY     fusion  . CESAREAN SECTION    . CHOLECYSTECTOMY    . COLONOSCOPY  11/22/2002   normal - Dr.Rrourk  . COLONOSCOPY  11/26/2005   incomplete with negative BE (Dr. Rowe Pavy, [place])  . EYE SURGERY    . FEMUR IM NAIL Left 05/08/2013   Procedure: INTRAMEDULLARY (IM) NAIL FEMORAL--LONG(BIOMET SYSTEM) and Zimmer Cables;  Surgeon: Augustin Schooling, MD;  Location: [place];  Service: Orthopedics;  Laterality: Left;  . HIP SURGERY Left   . KNEE ARTHROSCOPY     twice, left  . UMBILICAL HERNIA REPAIR    . UPPER GASTROINTESTINAL ENDOSCOPY  11/22/2002   Normal - Dr. Gala Romney     OB History   No obstetric history  on file.      Home Medications    Prior to Admission medications   Medication Sig Start Date End Date Taking? Authorizing Provider  acetaminophen (TYLENOL) 500 MG tablet Take 500 mg by mouth every 4 (four) hours as needed.    Yes [provider]  albuterol (PROVENTIL HFA;VENTOLIN HFA) 108 (90 Base) MCG/ACT inhaler Inhale 2 puffs into the lungs every 6 (six) hours as needed for wheezing or shortness of breath. 12/20/18  Yes Claretta Fraise, MD  alendronate (FOSAMAX) 70 MG tablet Take 1 tablet (70 mg total) by mouth every 7 (seven) days. Patient taking differently: Take 70 mg by mouth every Friday.   06/15/19  Yes Stacks, Cletus Gash, MD  apixaban (ELIQUIS) 2.5 MG TABS tablet Take 1 tablet (2.5 mg total) by mouth 2 (two) times daily. 06/15/19  Yes Stacks, Cletus Gash, MD  diltiazem (CARDIZEM CD) 180 MG 24 hr capsule TAKE 1 CAPSULE (180 MG TOTAL) BY MOUTH DAILY. Patient taking differently: Take 180 mg by mouth every morning.  06/15/19  Yes Claretta Fraise, MD  Dulaglutide (TRULICITY) 5.18 AC/1.6SA SOPN INJECT 0.75 MG INTO THE SKIN ONCE A WEEK. Patient taking differently: Inject 0.75 mg into the skin every Monday.  06/15/19  Yes Claretta Fraise, MD  ferrous sulfate 325 (65 FE) MG tablet Take 1 tablet (325 mg total) by mouth daily with breakfast. 06/15/19  Yes Stacks, Cletus Gash, MD  fish oil-omega-3 fatty acids 1000 MG capsule Take 2 g by mouth daily.     Yes [provider]  fluocinonide-emollient (LIDEX-E) 0.05 % cream Apply 1 application topically 2 (two) times daily. To affected areas Patient taking differently: Apply 1 application topically 2 (two) times daily. To affected areas ON FEET 03/21/19  Yes Stacks, Cletus Gash, MD  gabapentin (NEURONTIN) 300 MG capsule 1 at bedtime for1 week then 2  The next  week then 3 the next week then 4 daily. Patient taking differently: Take 300 mg by mouth 4 (four) times daily.  06/15/19  Yes Stacks, Cletus Gash, MD  Insulin Detemir (LEVEMIR FLEXTOUCH) 100 UNIT/ML Pen Inject 40 Units into the skin daily with breakfast. INJECT 70 UNITS INTO THE SKIN DAILY WITH BREAKFAST. Patient taking differently: Inject 30 Units into the skin 2 (two) times a day.  06/15/19  Yes Stacks, Cletus Gash, MD  ipratropium-albuterol (DUONEB) 0.5-2.5 (3) MG/3ML SOLN Inhale 3 mLs into the lungs every 6 (six) hours as needed (FOR SHORTNESS OF BREATH).  01/14/18  Yes [provider]  levothyroxine (SYNTHROID) 50 MCG tablet Take 1 tablet (50 mcg total) by mouth daily. 06/15/19  Yes Stacks, Cletus Gash, MD  losartan (COZAAR) 50 MG tablet Take 1 tablet (50 mg total) by mouth daily. Patient taking differently: Take 50  mg by mouth every evening.  06/15/19  Yes Stacks, Cletus Gash, MD  metFORMIN (GLUCOPHAGE-XR) 750 MG 24 hr tablet TAKE 1 TABLET BY MOUTH EVERY DAY WITH BREAKFAST Patient taking differently: Take 750 mg by mouth every morning.  06/15/19  Yes StacksCletus Gash, MD  OXYGEN Inhale 2 L into the lungs continuous.   Yes [provider]  potassium chloride SA (KLOR-CON M20) 20 MEQ tablet Take 1 tablet (20 mEq total) by mouth daily. 06/15/19  Yes Claretta Fraise, MD  torsemide (DEMADEX) 20 MG tablet Take 2 tablets (40 mg total) by mouth daily. Patient taking differently: Take 20 mg by mouth 2 (two) times daily.  06/15/19  Yes Claretta Fraise, MD  traZODone (DESYREL) 300 MG tablet Take 1 tablet (300 mg total) by mouth at bedtime. 06/15/19  Yes Claretta Fraise, MD    Family History Family History  Problem Relation Age of Onset  . Colon cancer Mother   . Heart attack Mother   . Alzheimer's disease Mother        father  . Diabetes Mother   . Heart disease Mother   . Cancer Mother        colon  . Kidney disease Mother   . Cancer Sister        brain and spine  . Migraines Sister   . Alzheimer's disease Father   . Cancer Brother        stomach  . Diabetes Sister   . Cancer Sister        breast  . Heart disease Sister   . Heart attack Sister   . Breast cancer Sister   . Heart disease Brother   . Heart murmur Brother   . Diabetes Other   . Heart attack Other   . Diabetes Daughter   . Asthma Daughter   . Asthma Grandchild     Social History Social History   Tobacco Use  . Smoking status: Never Smoker  . Smokeless tobacco: Never Used  Substance Use Topics  . Alcohol use: No  . Drug use: No     Allergies   Lisinopril and Codeine   Review of Systems Review of Systems  Constitutional: Negative for chills and fever.  HENT: Negative for facial swelling and sore throat.   Eyes: Positive for visual disturbance (blurred bilaterally).  Respiratory: Positive for shortness of breath.  Negative for cough.   Cardiovascular: Positive for chest pain.  Gastrointestinal: Negative for abdominal pain, nausea and vomiting.  Genitourinary: Negative for dysuria.  Musculoskeletal: Negative for back pain.  Skin: Negative for rash and wound.  Neurological: Positive for dizziness. Negative for headaches.  Psychiatric/Behavioral: The patient is not nervous/anxious.      Physical Exam Updated Vital Signs BP (!) 133/47   Pulse 61   Temp (!) 97.4 F (36.3 C) (Oral)   Resp 12   Ht 5\' 5"  (1.651 m)   Wt 127 kg   SpO2 99%   BMI 46.59 kg/m   Physical Exam Vitals signs and nursing note reviewed.  Constitutional:      General: She is not in acute distress.    Appearance: She is well-developed. She is not diaphoretic.  HENT:     Head: Normocephalic and atraumatic.     Mouth/Throat:     Pharynx: No oropharyngeal exudate.  Eyes:     General: No scleral icterus.       Right eye: No discharge.        Left eye: No discharge.     Conjunctiva/sclera: Conjunctivae normal.     Pupils: Pupils are equal, round, and reactive to light.  Neck:     Musculoskeletal: Normal range of motion and neck supple.     Thyroid: No thyromegaly.  Cardiovascular:     Rate and Rhythm: Normal rate and regular rhythm.     Heart sounds: Normal heart sounds. No murmur. No friction rub. No gallop.   Pulmonary:     Effort: Pulmonary effort is normal. Tachypnea present. No respiratory distress.     Breath sounds: No stridor. Decreased breath sounds (bilaterally) present. No wheezing or rales.     Comments: 2L O2 Abdominal:     General: Bowel sounds are normal. There is no distension.     Palpations: Abdomen is soft.     Tenderness:  There is no abdominal tenderness. There is no guarding or rebound.  Lymphadenopathy:     Cervical: No cervical adenopathy.  Skin:    General: Skin is warm and dry.     Coloration: Skin is not pale.     Findings: No rash.  Neurological:     Mental Status: She is alert.      Coordination: Coordination normal.     Comments: CN 3-12 intact; normal sensation throughout; 5/5 strength in all 4 extremities; equal bilateral grip strength       ED Treatments / Results  Labs (all labs ordered are listed, but only abnormal results are displayed) Labs Reviewed  COMPREHENSIVE METABOLIC PANEL - Abnormal; Notable for the following components:      Result Value   Potassium 5.2 (*)    Glucose, Bld 168 (*)    BUN 32 (*)    Creatinine, Ser 1.75 (*)    Calcium 8.7 (*)    Alkaline Phosphatase 170 (*)    GFR calc non Af Amer 28 (*)    GFR calc Af Amer 32 (*)    All other components within normal limits  CBC WITH DIFFERENTIAL/PLATELET - Abnormal; Notable for the following components:   RBC 3.17 (*)    Hemoglobin 9.7 (*)    HCT 33.0 (*)    MCV 104.1 (*)    MCHC 29.4 (*)    Platelets 107 (*)    All other components within normal limits  BRAIN NATRIURETIC PEPTIDE - Abnormal; Notable for the following components:   B Natriuretic Peptide 187.0 (*)    All other components within normal limits  URINALYSIS, ROUTINE W REFLEX MICROSCOPIC - Abnormal; Notable for the following components:   Color, Urine STRAW (*)    All other components within normal limits  SARS CORONAVIRUS 2 (HOSPITAL ORDER, [place] [facility])  TROPONIN I (HIGH SENSITIVITY)  TROPONIN I (HIGH SENSITIVITY)    EKG None  Radiology Dg Chest 2 View  Result Date: 06/22/2019 CLINICAL DATA:  Shortness of breath with exertion. EXAM: CHEST - 2 VIEW COMPARISON:  Chest x-ray dated January 21, 2018. FINDINGS: Stable cardiomegaly. Chronically coarsened interstitial markings with new superimposed mid to lower lobe predominant interstitial thickening. Slightly increased small right pleural effusion with worsening right basilar atelectasis. New trace left pleural effusion. No pneumothorax. No acute osseous abnormality. IMPRESSION: 1. New mild interstitial pulmonary edema with slightly increased small  right pleural effusion. New trace left pleural effusion. Electronically Signed   By: Titus Dubin M.D.   On: 06/22/2019 15:05   Ct Head Wo Contrast  Result Date: 06/22/2019 CLINICAL DATA:  Facial droop last night. Blurry vision and dizziness. EXAM: CT HEAD WITHOUT CONTRAST TECHNIQUE: Contiguous axial images were obtained from the base of the skull through the vertex without intravenous contrast. COMPARISON:  CT head dated November 20, 2008. FINDINGS: Brain: No evidence of acute infarction, hemorrhage, hydrocephalus, extra-axial collection or mass lesion/mass effect. Slightly progressed mild to moderate chronic microvascular ischemic changes. Vascular: Calcified atherosclerosis at the skullbase. No hyperdense vessel. Skull: Negative for fracture or focal lesion. Sinuses/Orbits: No acute finding. Other: None. IMPRESSION: 1.  No acute intracranial abnormality. 2. Slightly progressed mild to moderate chronic microvascular ischemic changes. Electronically Signed   By: Titus Dubin M.D.   On: 06/22/2019 15:09    Procedures Procedures (including critical care time)  Medications Ordered in ED Medications - No data to display   Initial Impression / Assessment and Plan / ED Course  I  have reviewed the triage vital signs and the nursing notes.  Pertinent labs & imaging results that were available during my care of the patient were reviewed by me and considered in my medical decision making (see chart for details).        Patient presenting with several complaints, most prominently shortness of breath, dizziness, and blurred vision.  Shortness of breath is mostly on exertion.  Dizziness and blurred vision has been intermittent for the past few days.  Normal neuro exam without focal deficits.  No indication for code stroke.  Suspect more TIA symptoms.  CT head is negative for acute findings, MRI is pending.  CHF exacerbation suspected as patient has new interstitial pulmonary edema with increased  small right pleural effusion and new trace left pleural effusion.  She has lower extremity edema as well.  Patient also has mild AKI.  I discussed patient case with Dr. Denton Brick with [facility] accepts patient for admission.  I appreciate her assistance with the patient.  I have updated the patient's daughter and POA, Lawerance Bach.  Patient also evaluated by my attending, Dr. Laverta Baltimore, who guided the patient's management and agrees with plan.  Final Clinical Impressions(s) / ED Diagnoses   Final diagnoses:  Acute on chronic diastolic congestive heart failure [facility])    ED Discharge Orders    None       Frederica Kuster, Hershal Coria 06/22/19 1701    Margette Fast, MD 06/24/19 2003

## 2019-06-22 NOTE — ED Triage Notes (Signed)
Pt started having sob on exertion last night. She had some facial droop last night per family. She is having blurred vision and dizziness today.

## 2019-06-22 NOTE — ED Notes (Signed)
[name] ([facility] [place]) [phone].

## 2019-06-23 ENCOUNTER — Observation Stay (HOSPITAL_COMMUNITY): Payer: Medicare HMO

## 2019-06-23 ENCOUNTER — Inpatient Hospital Stay (HOSPITAL_COMMUNITY): Payer: Medicare HMO

## 2019-06-23 ENCOUNTER — Observation Stay (HOSPITAL_BASED_OUTPATIENT_CLINIC_OR_DEPARTMENT_OTHER): Payer: Medicare HMO

## 2019-06-23 ENCOUNTER — Ambulatory Visit: Payer: Medicare HMO | Admitting: Family Medicine

## 2019-06-23 DIAGNOSIS — I2510 Atherosclerotic heart disease of native coronary artery without angina pectoris: Secondary | ICD-10-CM | POA: Diagnosis present

## 2019-06-23 DIAGNOSIS — J9600 Acute respiratory failure, unspecified whether with hypoxia or hypercapnia: Secondary | ICD-10-CM | POA: Diagnosis not present

## 2019-06-23 DIAGNOSIS — E11649 Type 2 diabetes mellitus with hypoglycemia without coma: Secondary | ICD-10-CM | POA: Diagnosis present

## 2019-06-23 DIAGNOSIS — G459 Transient cerebral ischemic attack, unspecified: Secondary | ICD-10-CM | POA: Diagnosis present

## 2019-06-23 DIAGNOSIS — E1121 Type 2 diabetes mellitus with diabetic nephropathy: Secondary | ICD-10-CM

## 2019-06-23 DIAGNOSIS — G8191 Hemiplegia, unspecified affecting right dominant side: Secondary | ICD-10-CM | POA: Diagnosis present

## 2019-06-23 DIAGNOSIS — I361 Nonrheumatic tricuspid (valve) insufficiency: Secondary | ICD-10-CM

## 2019-06-23 DIAGNOSIS — E039 Hypothyroidism, unspecified: Secondary | ICD-10-CM | POA: Diagnosis present

## 2019-06-23 DIAGNOSIS — N179 Acute kidney failure, unspecified: Secondary | ICD-10-CM | POA: Diagnosis present

## 2019-06-23 DIAGNOSIS — I4821 Permanent atrial fibrillation: Secondary | ICD-10-CM | POA: Diagnosis present

## 2019-06-23 DIAGNOSIS — J9811 Atelectasis: Secondary | ICD-10-CM | POA: Diagnosis present

## 2019-06-23 DIAGNOSIS — I6523 Occlusion and stenosis of bilateral carotid arteries: Secondary | ICD-10-CM | POA: Diagnosis not present

## 2019-06-23 DIAGNOSIS — I130 Hypertensive heart and chronic kidney disease with heart failure and stage 1 through stage 4 chronic kidney disease, or unspecified chronic kidney disease: Secondary | ICD-10-CM | POA: Diagnosis present

## 2019-06-23 DIAGNOSIS — I5033 Acute on chronic diastolic (congestive) heart failure: Secondary | ICD-10-CM | POA: Diagnosis present

## 2019-06-23 DIAGNOSIS — I371 Nonrheumatic pulmonary valve insufficiency: Secondary | ICD-10-CM

## 2019-06-23 DIAGNOSIS — E1122 Type 2 diabetes mellitus with diabetic chronic kidney disease: Secondary | ICD-10-CM | POA: Diagnosis present

## 2019-06-23 DIAGNOSIS — N183 Chronic kidney disease, stage 3 (moderate): Secondary | ICD-10-CM

## 2019-06-23 DIAGNOSIS — Z20828 Contact with and (suspected) exposure to other viral communicable diseases: Secondary | ICD-10-CM | POA: Diagnosis present

## 2019-06-23 DIAGNOSIS — M810 Age-related osteoporosis without current pathological fracture: Secondary | ICD-10-CM | POA: Diagnosis present

## 2019-06-23 DIAGNOSIS — J9610 Chronic respiratory failure, unspecified whether with hypoxia or hypercapnia: Secondary | ICD-10-CM | POA: Diagnosis present

## 2019-06-23 DIAGNOSIS — I270 Primary pulmonary hypertension: Secondary | ICD-10-CM | POA: Diagnosis not present

## 2019-06-23 DIAGNOSIS — G4733 Obstructive sleep apnea (adult) (pediatric): Secondary | ICD-10-CM | POA: Diagnosis present

## 2019-06-23 DIAGNOSIS — R29810 Facial weakness: Secondary | ICD-10-CM | POA: Diagnosis present

## 2019-06-23 DIAGNOSIS — Z7901 Long term (current) use of anticoagulants: Secondary | ICD-10-CM | POA: Diagnosis not present

## 2019-06-23 DIAGNOSIS — I272 Pulmonary hypertension, unspecified: Secondary | ICD-10-CM | POA: Diagnosis present

## 2019-06-23 DIAGNOSIS — Z6841 Body Mass Index (BMI) 40.0 and over, adult: Secondary | ICD-10-CM | POA: Diagnosis not present

## 2019-06-23 DIAGNOSIS — E875 Hyperkalemia: Secondary | ICD-10-CM | POA: Diagnosis present

## 2019-06-23 DIAGNOSIS — J449 Chronic obstructive pulmonary disease, unspecified: Secondary | ICD-10-CM | POA: Diagnosis present

## 2019-06-23 DIAGNOSIS — R0600 Dyspnea, unspecified: Secondary | ICD-10-CM | POA: Diagnosis present

## 2019-06-23 DIAGNOSIS — D631 Anemia in chronic kidney disease: Secondary | ICD-10-CM | POA: Diagnosis present

## 2019-06-23 DIAGNOSIS — E785 Hyperlipidemia, unspecified: Secondary | ICD-10-CM | POA: Diagnosis present

## 2019-06-23 LAB — GLUCOSE, CAPILLARY
Glucose-Capillary: 147 mg/dL — ABNORMAL HIGH (ref 70–99)
Glucose-Capillary: 173 mg/dL — ABNORMAL HIGH (ref 70–99)
Glucose-Capillary: 130 mg/dL — ABNORMAL HIGH (ref 70–99)
Glucose-Capillary: 146 mg/dL — ABNORMAL HIGH (ref 70–99)

## 2019-06-23 LAB — BASIC METABOLIC PANEL
Anion gap: 7 (ref 5–15)
BUN: 32 mg/dL — ABNORMAL HIGH (ref 8–23)
CO2: 28 mmol/L (ref 22–32)
Calcium: 9 mg/dL (ref 8.9–10.3)
Chloride: 105 mmol/L (ref 98–111)
Creatinine, Ser: 1.63 mg/dL — ABNORMAL HIGH (ref 0.44–1.00)
GFR calc Af Amer: 35 mL/min — ABNORMAL LOW (ref >60)
GFR calc non Af Amer: 31 mL/min — ABNORMAL LOW (ref >60)
Glucose, Bld: 174 mg/dL — ABNORMAL HIGH (ref 70–99)
Potassium: 5.5 mmol/L — ABNORMAL HIGH (ref 3.5–5.1)
Sodium: 140 mmol/L (ref 135–145)

## 2019-06-23 LAB — LIPID PANEL
Cholesterol: 149 mg/dL (ref 0–200)
HDL: 55 mg/dL (ref >40)
LDL Cholesterol: 82 mg/dL (ref 0–99)
Total CHOL/HDL Ratio: 2.7 RATIO
Triglycerides: 60 mg/dL (ref <150)
VLDL: 12 mg/dL (ref 0–40)

## 2019-06-23 LAB — ECHOCARDIOGRAM COMPLETE
Height: 65 in
Weight: 4790.15 oz

## 2019-06-23 LAB — HEMOGLOBIN A1C
Hgb A1c MFr Bld: 7.2 % — ABNORMAL HIGH (ref 4.8–5.6)
Mean Plasma Glucose: 159.94 mg/dL

## 2019-06-23 MED ORDER — ASPIRIN 81 MG PO CHEW
81.0000 mg | CHEWABLE_TABLET | Freq: Every day | ORAL | Status: DC
  Administered 2019-06-23 – 2019-06-24 (×2): 81 mg via ORAL
  Filled 2019-06-23 (×2): qty 1

## 2019-06-23 NOTE — Progress Notes (Signed)
PROGRESS NOTE    [name]  KNL:[id number] DOB: October 08, 1946 DOA: 06/22/2019 PCP: Claretta Fraise, MD    Brief Narrative:  73 y.o. female with medical history significant for pulmonary hypertension, CHF, diabetes mellitus, atrial fibrillation, OSA and asthma, CVA, coronary artery disease, obesity BMI 46, patient presented to the ED with multiple complaints. Patient tells me last night and today her daughter noticed that the right side of her face was droopy.  This lasted about 10 minutes.  She reports that the same pain that she had left upper and lower extremity weakness, at this time she feels that her left upper extremity still weak.  She also reports blurred vision over the past 2 days, intermittent bouts currently she has had blurred vision involving her 2 eyes for the past 24 hours.  She denies slurred speech. Patient also reports chest pain with exertion, that improves with rest.  Chest pain radiates down her left arm.  She describes the pain as an numbness. She denies difficulty breathing at rest or with exertion.  She has chronic lower extremity swelling that is unchanged.  She does not weigh herself daily but she tells me her regular weight is 127 kg.  She reports compliance with her torsemide, low-salt diet and fluid restriction.  Denies vomiting or loose stools, and has maintained good p.o. intake.  No fever or chills.  She is on 2 L of O2 mostly at night.  ED Course: Heart rate 50s to 60s, blood pressure systolic 790W to 409B, O2 sats 96% on nasal cannula.  Creatinine elevated 1.75, baseline 1.1-1.2.  Potassium elevated 5.2.  Chronic anemia hemoglobin 9.7.  Mildly low platelets 107.  BNP elevated 187. T waves appear peaked but last EKG was from 2018.  Head CT shows mild to moderate chronic ischemic changes.  There is new mild interstitial pulmonary edema with slightly increased small right pleural effusion.  New trace left pleural effusion. Hospitalist to admit for decompensated CHF  and TIA/stroke work-up.    Assessment & Plan: 1-right-sided weakness, blurred vision and facial droop: In the setting of TIA -Work-up demonstrating abnormal left karate artery patency -MRA has been ordered to further characterize appreciated defects -Continue Eliquis for secondary prevention -A1c 7.2; LDL 82 -Continue risk factor modifications. -Will follow any further recommendations by neurology.  2-acute on chronic diastolic heart failure -Continue daily weights and strict I's and O's -Continue low-sodium diet -Continue IV Lasix -Patient reporting good urine output and expressed stabilization of her breathing.  3-acute on chronic renal failure: Stage III at baseline -Creatinine improving with diuresis -Continue to follow renal function trend -Continue IV Lasix.  4-Severe obesity (BMI >= 40) (HCC) -Body mass index is 49.82 kg/m. -Low calorie diet, portion control and increase physical activity discussed with patient.  5-type 2 diabetes mellitus with nephropathy -Continue sliding scale insulin and Lantus -A1c 7.2. -Continue modify carbohydrates.  6-hyperkalemia -Continue to follow electrolytes trend -No abnormalities appreciated on telemetry currently -Will continue holding potassium supplementation and losartan -Repeat basic metabolic panel in a.m.  7-atrial fibrillation -Continue Cardizem -Continue Eliquis. -Continue monitoring on telemetry.   DVT prophylaxis: Chronically on Eliquis. Code Status: Full code Family Communication: No family at bedside Disposition Plan: Patient reports resolution of neurologic symptoms on admission; denies chest pain, breathing at baseline.  She is having improvement in her urine output.  Will complete stroke work-up and follow neurology recommendations.  Consultants:   Neurology  Procedures:   See below for x-ray report.  Antimicrobials:  Anti-infectives (From  admission, onward)   None       Subjective: Afebrile, no  chest pain, no nausea, no vomiting.  Reports significant improvement/resolution of weakness on her right side.  No blood patient reported. Negative pronation drift.  Objective: Vitals:   06/23/19 0745 06/23/19 1145 06/23/19 1352 06/23/19 1545  BP: 117/61 (!) 137/53 (!) 114/41 121/63  Pulse: (!) 54 87 (!) 51 65  Resp: 17 19 18 17   Temp:    98.4 F (36.9 C)  TempSrc: Oral   Oral  SpO2: 97% 98% 100%   Weight:      Height:        Intake/Output Summary (Last 24 hours) at 06/23/2019 1641 Last data filed at 06/23/2019 1300 Gross per 24 hour  Intake 480 ml  Output 1700 ml  Net -1220 ml   Filed Weights   06/22/19 1244 06/23/19 0500  Weight: 127 kg 135.8 kg    Examination: General exam: Alert, awake, oriented x 3; patient reports no weakness at this moment, expressed breathing is a stable and reports increased urine output.  No chest pain.  Respiratory system: Decreased breath sounds at the bases, no wheezing, normal respiratory effort.  Good oxygen saturation on chronic oxygen supplementation. Cardiovascular system:Rate controlled, no rubs, no gallops, no murmurs.  Gastrointestinal system: Abdomen is nondistended, soft and nontender. No organomegaly or masses felt. Normal bowel sounds heard. Central nervous system: Alert and oriented. No focal neurological deficits. Extremities: Chronic lymphedema (++ II swelling); no open wounds. Psychiatry: Judgement and insight appear normal. Mood & affect appropriate.     Data Reviewed: I have personally reviewed following labs and imaging studies  CBC: Recent Labs  Lab 06/22/19 1452  WBC 5.8  NEUTROABS 4.5  HGB 9.7*  HCT 33.0*  MCV 104.1*  PLT 403*   Basic Metabolic Panel: Recent Labs  Lab 06/22/19 1452 06/23/19 0556  NA 138 140  K 5.2* 5.5*  CL 106 105  CO2 25 28  GLUCOSE 168* 174*  BUN 32* 32*  CREATININE 1.75* 1.63*  CALCIUM 8.7* 9.0   GFR: Estimated Creatinine Clearance: 41.7 mL/min (A) (by C-G formula based on SCr of  1.63 mg/dL (H)).   Liver Function Tests: Recent Labs  Lab 06/22/19 1452  AST 16  ALT 18  ALKPHOS 170*  BILITOT 1.0  PROT 6.8  ALBUMIN 3.6   HbA1C: Recent Labs    06/23/19 0556  HGBA1C 7.2*   CBG: Recent Labs  Lab 06/22/19 2047 06/23/19 0712 06/23/19 1101 06/23/19 1614  GLUCAP 133* 147* 173* 130*   Lipid Profile: Recent Labs    06/23/19 0556  CHOL 149  HDL 55  LDLCALC 82  TRIG 60  CHOLHDL 2.7   Urine analysis:    Component Value Date/Time   COLORURINE STRAW (A) 06/22/2019 1414   APPEARANCEUR CLEAR 06/22/2019 1414   APPEARANCEUR Clear 05/28/2018 1455   LABSPEC 1.008 06/22/2019 1414   PHURINE 5.0 06/22/2019 1414   GLUCOSEU NEGATIVE 06/22/2019 1414   HGBUR NEGATIVE 06/22/2019 1414   BILIRUBINUR NEGATIVE 06/22/2019 1414   BILIRUBINUR Negative 05/28/2018 1455   KETONESUR NEGATIVE 06/22/2019 1414   PROTEINUR NEGATIVE 06/22/2019 1414   UROBILINOGEN negative 12/23/2013 1057   UROBILINOGEN 0.2 05/06/2013 2213   NITRITE NEGATIVE 06/22/2019 1414   LEUKOCYTESUR NEGATIVE 06/22/2019 1414    Recent Results (from the past 240 hour(s))  SARS Coronavirus 2 (CEPHEID - Performed in [place] hospital lab), Hosp Order     Status: None   Collection Time: 06/22/19  2:30 PM  Specimen: Nasopharyngeal Swab  Result Value Ref Range Status   SARS Coronavirus 2 NEGATIVE NEGATIVE Final    Comment: (NOTE) If result is NEGATIVE SARS-CoV-2 target nucleic acids are NOT DETECTED. The SARS-CoV-2 RNA is generally detectable in upper and lower  respiratory specimens during the acute phase of infection. The lowest  concentration of SARS-CoV-2 viral copies this assay can detect is 250  copies / mL. A negative result does not preclude SARS-CoV-2 infection  and should not be used as the sole basis for treatment or other  patient management decisions.  A negative result may occur with  improper specimen collection / handling, submission of specimen other  than nasopharyngeal swab,  presence of viral mutation(s) within the  areas targeted by this assay, and inadequate number of viral copies  (<250 copies / mL). A negative result must be combined with clinical  observations, patient history, and epidemiological information. If result is POSITIVE SARS-CoV-2 target nucleic acids are DETECTED. The SARS-CoV-2 RNA is generally detectable in upper and lower  respiratory specimens dur ing the acute phase of infection.  Positive  results are indicative of active infection with SARS-CoV-2.  Clinical  correlation with patient history and other diagnostic information is  necessary to determine patient infection status.  Positive results do  not rule out bacterial infection or co-infection with other viruses. If result is PRESUMPTIVE POSTIVE SARS-CoV-2 nucleic acids MAY BE PRESENT.   A presumptive positive result was obtained on the submitted specimen  and confirmed on repeat testing.  While 2019 novel coronavirus  (SARS-CoV-2) nucleic acids may be present in the submitted sample  additional confirmatory testing may be necessary for epidemiological  and / or clinical management purposes  to differentiate between  SARS-CoV-2 and other Sarbecovirus currently known to infect humans.  If clinically indicated additional testing with an alternate test  methodology [phone]) is advised. The SARS-CoV-2 RNA is generally  detectable in upper and lower respiratory sp ecimens during the acute  phase of infection. The expected result is Negative. Fact Sheet for Patients:  StrictlyIdeas.no Fact Sheet for Healthcare Providers: BankingDealers.co.za This test is not yet approved or cleared by the [place] FDA and has been authorized for detection and/or diagnosis of SARS-CoV-2 by FDA under an Emergency Use Authorization (EUA).  This EUA will remain in effect (meaning this test can be used) for the duration of the COVID-19 declaration under  Section 564(b)(1) of the Act, 21 U.S.C. section 360bbb-3(b)(1), unless the authorization is terminated or revoked sooner. Performed at [facility], [address] [place], [place] [address]      Radiology Studies: Dg Chest 2 View  Result Date: 06/22/2019 CLINICAL DATA:  Shortness of breath with exertion. EXAM: CHEST - 2 VIEW COMPARISON:  Chest x-ray dated January 21, 2018. FINDINGS: Stable cardiomegaly. Chronically coarsened interstitial markings with new superimposed mid to lower lobe predominant interstitial thickening. Slightly increased small right pleural effusion with worsening right basilar atelectasis. New trace left pleural effusion. No pneumothorax. No acute osseous abnormality. IMPRESSION: 1. New mild interstitial pulmonary edema with slightly increased small right pleural effusion. New trace left pleural effusion. Electronically Signed   By: Titus Dubin M.D.   On: 06/22/2019 15:05   Ct Head Wo Contrast  Result Date: 06/22/2019 CLINICAL DATA:  Facial droop last night. Blurry vision and dizziness. EXAM: CT HEAD WITHOUT CONTRAST TECHNIQUE: Contiguous axial images were obtained from the base of the skull through the vertex without intravenous contrast. COMPARISON:  CT head dated November 20, 2008.  FINDINGS: Brain: No evidence of acute infarction, hemorrhage, hydrocephalus, extra-axial collection or mass lesion/mass effect. Slightly progressed mild to moderate chronic microvascular ischemic changes. Vascular: Calcified atherosclerosis at the skullbase. No hyperdense vessel. Skull: Negative for fracture or focal lesion. Sinuses/Orbits: No acute finding. Other: None. IMPRESSION: 1.  No acute intracranial abnormality. 2. Slightly progressed mild to moderate chronic microvascular ischemic changes. Electronically Signed   By: Titus Dubin M.D.   On: 06/22/2019 15:09   Mr Brain Wo Contrast  Result Date: 06/22/2019 CLINICAL DATA:  Neuro deficit. Dizziness blurred vision facial droop EXAM:  MRI HEAD WITHOUT CONTRAST TECHNIQUE: Multiplanar, multiecho pulse sequences of the brain and surrounding structures were obtained without intravenous contrast. COMPARISON:  CT head 06/22/2019 FINDINGS: Brain: Negative for acute infarct. Moderate chronic microvascular ischemic changes in the white matter. Ventricle size normal. Negative for hemorrhage mass or fluid collection. No midline shift. Vascular: Normal arterial flow void Skull and upper cervical spine: Negative Sinuses/Orbits: Paranasal sinuses clear.  Bilateral cataract surgery Other: None IMPRESSION: Negative for acute infarct. Moderate chronic microvascular ischemic changes in the white matter. Electronically Signed   By: Franchot Gallo M.D.   On: 06/22/2019 18:27   US Carotid Bilateral  Result Date: 06/23/2019 CLINICAL DATA:  Right sided weakness. Blurred vision. History of CAD, atrial fibrillation, hypertension, hyperlipidemia and diabetes. EXAM: BILATERAL CAROTID DUPLEX ULTRASOUND TECHNIQUE: Pearline Cables scale imaging, color Doppler and duplex ultrasound were performed of bilateral carotid and vertebral arteries in the neck. COMPARISON:  None. FINDINGS: Criteria: Quantification of carotid stenosis is based on velocity parameters that correlate the residual internal carotid diameter with NASCET-based stenosis levels, using the diameter of the distal internal carotid lumen as the denominator for stenosis measurement. The following velocity measurements were obtained: RIGHT ICA: 113/22 cm/sec CCA: 355/73 cm/sec SYSTOLIC ICA/CCA RATIO:  1.0 ECA: 109 cm/sec LEFT ICA: 132/29 cm/sec CCA: 220/25 cm/sec SYSTOLIC ICA/CCA RATIO:  1.3 ECA: 116 cm/sec RIGHT CAROTID ARTERY: There is a moderate amount of eccentric echogenic plaque within the right carotid bulb (image 17), extending to involve the origin and proximal aspects of the right internal carotid artery (image 24), not resulting in elevated peak systolic velocities within the interrogated course of the right  internal carotid artery to suggest a hemodynamically significant stenosis. RIGHT VERTEBRAL ARTERY:  Antegrade Flow LEFT CAROTID ARTERY: There is a large amount of eccentric echogenic plaque within the left carotid bulb (images 48 and 50), extending to involve the origin and proximal aspects of the left internal carotid artery (image 58) which results in borderline elevated peak systolic velocities within the distal aspect the left internal carotid artery. Greatest acquired peak systolic velocity within distal left ICA measures 132 centimeters/second (image 66). LEFT VERTEBRAL ARTERY:  Antegrade Flow Note is made of a cardiac arrhythmia compatible provided history of atrial fibrillation. IMPRESSION: 1. Large amount of left-sided atherosclerotic plaque results in elevated peak systolic velocities within the left internal carotid artery compatible with the 50-69% luminal narrowing range. Further evaluation with CTA could be performed as clinically indicated. 2. Moderate amount of right-sided atherosclerotic plaque, not resulting in a hemodynamically significant stenosis. Electronically Signed   By: Sandi Mariscal M.D.   On: 06/23/2019 10:53    Scheduled Meds:  apixaban  2.5 mg Oral BID   diltiazem  180 mg Oral q morning - 10a   furosemide  40 mg Intravenous Q12H   gabapentin  300 mg Oral QID   insulin aspart  0-15 Units Subcutaneous TID WC   insulin detemir  20  Units Subcutaneous BID   levothyroxine  50 mcg Oral Q0600   trazodone  150 mg Oral QHS   Continuous Infusions:   LOS: 0 days   Time spent: 35 minutes. Greater than 50% of this time was spent in direct contact with the patient, coordinating care and discussing relevant ongoing clinical issues, including TIA, abnormal left carotid findings, AKI on chronic renal failure and acute on chronic diastolic HF.   Barton Dubois, MD [facility] Pager [phone]   06/23/2019, 4:41 PM

## 2019-06-23 NOTE — Plan of Care (Signed)
  Problem: Acute Rehab PT Goals(only PT should resolve) Goal: Pt Will Go Supine/Side To Sit Outcome: Progressing Flowsheets (Taken 06/23/2019 1228) Pt will go Supine/Side to Sit: with modified independence Goal: Patient Will Transfer Sit To/From Stand Outcome: Progressing Flowsheets (Taken 06/23/2019 1228) Patient will transfer sit to/from stand: with modified independence Goal: Pt Will Transfer Bed To Chair/Chair To Bed Outcome: Progressing Flowsheets (Taken 06/23/2019 1228) Pt will Transfer Bed to Chair/Chair to Bed: with supervision Goal: Pt Will Ambulate Outcome: Progressing Flowsheets (Taken 06/23/2019 1228) Pt will Ambulate:  50 feet  with supervision  with rolling walker  12:31 PM, 06/23/19 Lonell Grandchild, MPT Physical Therapist with [facility] [phone] office [phone] mobile phone

## 2019-06-23 NOTE — Progress Notes (Signed)
Notified dr Darrick Meigs via [facility] page system that pt had 2.32 second pause.

## 2019-06-23 NOTE — Progress Notes (Signed)
*  PRELIMINARY RESULTS* Echocardiogram 2D Echocardiogram has been performed.  Samuel Germany 06/23/2019, 1:02 PM

## 2019-06-23 NOTE — Care Management Obs Status (Signed)
[place] NOTIFICATION   Patient Details  Name: [name] MRN: [id number] Date of Birth: Feb 20, 1946   Medicare Observation Status Notification Given:  Yes    Ihor Gully, LCSW 06/23/2019, 11:16 AM

## 2019-06-23 NOTE — Progress Notes (Signed)
Informed by Central telemetry that patient had pauses with HR in 30's. Patient has been assessed and denies any chest pain or discomfort and is resting comfortably in bed at this time. MD has been E-Paged. Vital signs are as follows.    06/23/19 1352  Vitals  BP (!) 114/41  BP Location Right Arm  BP Method Automatic  Patient Position (if appropriate) Lying  Pulse Rate (!) 51  Resp 18  Oxygen Therapy  SpO2 100 %  O2 Device Nasal Cannula  O2 Flow Rate (L/min) 2 L/min  MEWS Score  MEWS RR 0  MEWS Pulse 0  MEWS Systolic 0  MEWS LOC 0  MEWS Temp 0  MEWS Score 0  MEWS Score Color Green   Will continue to monitor patient.

## 2019-06-23 NOTE — Evaluation (Signed)
Physical Therapy Evaluation Patient Details Name: [name] MRN: [id number] DOB: [birth year] Today's Date: 06/23/2019   History of Present Illness  [name] is a 73 y.o. female with medical history significant for pulmonary hypertension, CHF, diabetes mellitus, atrial fibrillation, OSA and asthma, CVA, coronary artery disease, obesity BMI 46, patient presented to the ED with multiple complaints.Patient tells me last night and today her daughter noticed that the right side of her face was droopy.  This lasted about 10 minutes.  She reports that the same pain that she had left upper and lower extremity weakness, at this time she feels that her left upper extremity still weak.  She also reports blurred vision over the past 2 days, intermittent bouts currently she has had blurred vision involving her 2 eyes for the past 24 hours.  She denies slurred speech.Patient also reports chest pain with exertion, that improves with rest.  Chest pain radiates down her left arm.  She describes the pain as an numbness.She denies difficulty breathing at rest or with exertion.  She has chronic lower extremity swelling that is unchanged.  She does not weigh herself daily but she tells me her regular weight is 127 kg.  She reports compliance with her torsemide, low-salt diet and fluid restriction.  Denies vomiting or loose stools, and has maintained good p.o. intake.  No fever or chills.  She is on 2 L of O2 mostly at night.    Clinical Impression  Patient functioning near baseline for functional mobility and gait, demonstrates slow labored movement for sitting up at bedside, able to transfer to commode in bathroom, had to use grab bar to sit to stand from commode with supervision, desaturated to 86% on room air, had to put back on 2 LPM O2 for gait training, no loss of balance, limited secondary to fatigue with SpO2 at 91-93% and tolerated sitting up in chair after therapy.  Patient will benefit from continued  physical therapy in hospital and recommended venue below to increase strength, balance, endurance for safe ADLs and gait.     Follow Up Recommendations Home health PT;Supervision for mobility/OOB;Supervision - Intermittent    Equipment Recommendations  None recommended by PT    Recommendations for Other Services       Precautions / Restrictions Precautions Precautions: Fall Restrictions Weight Bearing Restrictions: No      Mobility  Bed Mobility Overal bed mobility: Needs Assistance Bed Mobility: Supine to Sit     Supine to sit: Min guard     General bed mobility comments: slow labored movment  Transfers Overall transfer level: Needs assistance Equipment used: Rolling walker (2 wheeled) Transfers: Sit to/from [facility] Sit to Stand: Supervision;Min guard Stand pivot transfers: Supervision;Min guard       General transfer comment: labored movement for sit to stands from bedside using RW and commode in bathroom using grab bar and RW  Ambulation/Gait Ambulation/Gait assistance: Supervision;Min guard Gait Distance (Feet): 35 Feet Assistive device: Rolling walker (2 wheeled) Gait Pattern/deviations: Decreased step length - right;Decreased step length - left;Decreased stride length Gait velocity: decreased   General Gait Details: slightly labored cadence without loss of balance while on 2 LPM O2 with SpO2 at 91-93%  Stairs            Wheelchair Mobility    Modified Rankin (Stroke Patients Only)       Balance Overall balance assessment: Needs assistance Sitting-balance support: Feet supported;No upper extremity supported Sitting balance-Leahy Scale: Good  Standing balance support: During functional activity;Bilateral upper extremity supported Standing balance-Leahy Scale: Fair Standing balance comment: using RW                             Pertinent Vitals/Pain Pain Assessment: No/denies pain    Home Living  Family/patient expects to be discharged to:: Private residence Living Arrangements: Other relatives Available Help at Discharge: Family;Available 24 hours/day Type of Home: House Home Access: Level entry     Home Layout: One level Home Equipment: Other (comment);Walker - 4 wheels;Wheelchair - manual;Shower seat - built in;Toilet riser;Walker - 2 wheels Additional Comments: Walker- 3 wheels    Prior Function Level of Independence: Independent with assistive device(s)         Comments: Household ambulator with 3 [employer]     Hand Dominance   Dominant Hand: Right    Extremity/Trunk Assessment   Upper Extremity Assessment Upper Extremity Assessment: Generalized weakness    Lower Extremity Assessment Lower Extremity Assessment: Generalized weakness    Cervical / Trunk Assessment Cervical / Trunk Assessment: Kyphotic  Communication   Communication: No difficulties  Cognition Arousal/Alertness: Awake/alert Behavior During Therapy: WFL for tasks assessed/performed Overall Cognitive Status: Within Functional Limits for tasks assessed                                        General Comments      Exercises     Assessment/Plan    PT Assessment Patient needs continued PT services  PT Problem List Decreased strength;Decreased activity tolerance;Decreased balance;Decreased mobility       PT Treatment Interventions Gait training;Stair training;Functional mobility training;Therapeutic activities;Therapeutic exercise;Patient/family education    PT Goals (Current goals can be found in the Care Plan section)  Acute Rehab PT Goals Patient Stated Goal: return home with family to assist PT Goal Formulation: With patient Time For Goal Achievement: 06/27/19 Potential to Achieve Goals: Good    Frequency Min 3X/week   Barriers to discharge        Co-evaluation               AM-PAC PT "6 Clicks" Mobility  Outcome Measure Help needed turning from your  back to your side while in a flat bed without using bedrails?: None Help needed moving from lying on your back to sitting on the side of a flat bed without using bedrails?: A Little Help needed moving to and from a bed to a chair (including a wheelchair)?: A Little Help needed standing up from a chair using your arms (e.g., wheelchair or bedside chair)?: A Little Help needed to walk in hospital room?: A Little Help needed climbing 3-5 steps with a railing? : A Lot 6 Click Score: 18    End of Session Equipment Utilized During Treatment: Oxygen Activity Tolerance: Patient tolerated treatment well;Patient limited by fatigue Patient left: in chair;with call bell/phone within reach Nurse Communication: Mobility status PT Visit Diagnosis: Unsteadiness on feet (R26.81);Other abnormalities of gait and mobility (R26.89);Muscle weakness (generalized) (M62.81)    Time: 0998-3382 PT Time Calculation (min) (ACUTE ONLY): 32 min   Charges:   PT Evaluation $PT Eval Moderate Complexity: 1 Mod PT Treatments $Therapeutic Activity: 23-37 mins        12:27 PM, 06/23/19 Lonell Grandchild, MPT Physical Therapist with [facility] [phone] office [phone] mobile phone

## 2019-06-23 NOTE — TOC Initial Note (Signed)
Transition of Care [facility] - Initial/Assessment Note    Patient Details  Name: [name] MRN: [id number] Date of Birth: 02/20/1946  [facility] CM/SW Contact:    Ihor Gully, LCSW Phone Number: 06/23/2019, 12:20 PM  Clinical Narrative:                 Patient is agreeable to [facility] services. She has oxygen in the home, drives and reports being independent mostly. Her granddaughter does assist her with bathing. Patient has a walker, BSC, and build in shower chair in the home. She is agreeable to HHPT. Referral made to preference.   Expected Discharge Plan: [place] Barriers to Discharge: No Barriers Identified   Patient Goals and CMS Choice Patient states their goals for this hospitalization and ongoing recovery are:: To return home to her baseline. CMS Medicare.gov Compare Post Acute Care list provided to:: Patient Choice offered to / list presented to : Patient  Expected Discharge Plan and Services Expected Discharge Plan: [place] Choice: [place] arrangements for the past 2 months: [place]: PT [place]: [place] ([place]) Date [place]: 06/23/19 Time [place]: 1212 Representative spoke with at [place]: [place] Arrangements/Services Living arrangements for the past 2 months: [place] with:: Relatives Patient language and need for interpreter reviewed:: Yes Do you feel safe going back to the place where you live?: Yes      Need for Family Participation in Patient Care: Yes (Comment) Care giver support system in place?: Yes (comment) Current home services: DME Criminal Activity/Legal Involvement Pertinent to Current Situation/Hospitalization: No - Comment as needed  Activities of Daily Living Home Assistive Devices/Equipment: Gilford Rile (specify type) ADL Screening  (condition at time of admission) Patient's cognitive ability adequate to safely complete daily activities?: Yes Is the patient deaf or have difficulty hearing?: No Does the patient have difficulty seeing, even when wearing glasses/contacts?: Yes Does the patient have difficulty concentrating, remembering, or making decisions?: No Patient able to express need for assistance with ADLs?: Yes Does the patient have difficulty dressing or bathing?: No Independently performs ADLs?: No Communication: Independent Dressing (OT): Needs assistance Is this a change from baseline?: Pre-admission baseline Grooming: Needs assistance Is this a change from baseline?: Pre-admission baseline Feeding: Independent Bathing: Needs assistance Is this a change from baseline?: Pre-admission baseline Toileting: Independent In/Out Bed: Independent Walks in Home: Independent with device (comment) Does the patient have difficulty walking or climbing stairs?: No Weakness of Legs: Both Weakness of Arms/Hands: None  Permission Sought/Granted                  Emotional Assessment     Affect (typically observed): Accepting, Calm Orientation: : Oriented to Self, Oriented to Place, Oriented to  Time, Oriented to Situation Alcohol / Substance Use: Not Applicable Psych Involvement: No (comment)  Admission diagnosis:  Acute on chronic diastolic congestive heart failure ([place]) [I50.33] Patient Active Problem List   Diagnosis Date Noted  . Facial droop 06/22/2019  . Educated About Covid-19 Virus Infection 04/12/2019  . Leg swelling 04/12/2019  . Coronary artery disease involving native coronary artery of native heart without angina pectoris 04/12/2019  . Hyperlipidemia 02/23/2018  . Current use  of long term anticoagulation 08/26/2017  . Hypothyroidism 02/04/2017  . Long-term current use of insulin for diabetes mellitus ([place]) 08/15/2015  . Chronic renal insufficiency 08/15/2015  . Diabetic nephropathy  associated with type 2 diabetes mellitus ([place]) 08/15/2015  . Osteoporosis 02/21/2015  . Normocytic anemia due to blood loss 05/09/2013  . Thrombocytopenia, unspecified ([place]) 05/09/2013  . Severe obesity (BMI >= 40) ([place]) 02/28/2008  . PULMONARY HYPERTENSION 02/28/2008  . Congestive heart failure ([place]) 02/28/2008  . PULMONARY NODULE, LEFT UPPER LOBE 02/28/2008  . OBSTRUCTIVE SLEEP APNEA 02/28/2008   PCP:  Claretta Fraise, MD Pharmacy:   [id number] - [place], [address] [place] [address] Phone: [phone] Fax: [phone]  [facility] [place], [address] [place] [address] Phone: [phone] Fax: [phone]     Social Determinants of Health (SDOH) Interventions    Readmission Risk Interventions No flowsheet data found.

## 2019-06-24 DIAGNOSIS — N179 Acute kidney failure, unspecified: Secondary | ICD-10-CM

## 2019-06-24 DIAGNOSIS — G459 Transient cerebral ischemic attack, unspecified: Secondary | ICD-10-CM

## 2019-06-24 LAB — BASIC METABOLIC PANEL
Anion gap: 13 (ref 5–15)
BUN: 32 mg/dL — ABNORMAL HIGH (ref 8–23)
CO2: 24 mmol/L (ref 22–32)
Calcium: 9.1 mg/dL (ref 8.9–10.3)
Chloride: 102 mmol/L (ref 98–111)
Creatinine, Ser: 1.64 mg/dL — ABNORMAL HIGH (ref 0.44–1.00)
GFR calc Af Amer: 35 mL/min — ABNORMAL LOW (ref >60)
GFR calc non Af Amer: 30 mL/min — ABNORMAL LOW (ref >60)
Glucose, Bld: 131 mg/dL — ABNORMAL HIGH (ref 70–99)
Potassium: 5.4 mmol/L — ABNORMAL HIGH (ref 3.5–5.1)
Sodium: 139 mmol/L (ref 135–145)

## 2019-06-24 LAB — GLUCOSE, CAPILLARY
Glucose-Capillary: 124 mg/dL — ABNORMAL HIGH (ref 70–99)
Glucose-Capillary: 160 mg/dL — ABNORMAL HIGH (ref 70–99)

## 2019-06-24 MED ORDER — LEVEMIR FLEXTOUCH 100 UNIT/ML ~~LOC~~ SOPN: 40.0000 [IU] | PEN_INJECTOR | Freq: Two times a day (BID) | SUBCUTANEOUS | 3 refills | Status: DC

## 2019-06-24 MED ORDER — LOSARTAN POTASSIUM 50 MG PO TABS: 50.0000 mg | ORAL_TABLET | Freq: Every day | ORAL | 3 refills | Status: DC

## 2019-06-24 MED ORDER — TORSEMIDE 20 MG PO TABS: 60.0000 mg | ORAL_TABLET | Freq: Every day | ORAL | Status: DC

## 2019-06-24 NOTE — Discharge Summary (Addendum)
Physician Discharge Summary  [name] BHA:[id number] DOB: 20-May-1946 DOA: 06/22/2019  PCP: Claretta Fraise, MD  Admit date: 06/22/2019 Discharge date: 06/24/2019  Time spent: 35 minutes  Recommendations for Outpatient Follow-up:  1. Repeat basic metabolic panel to follow electrolytes and renal function 2. Close follow-up the patient CBGs and A1c with adjustment of hypoglycemic regimen as needed 3. Reassess volume status and blood pressure with further adjustment to patient's antihypertensive regimen and diuretics. 4. -Cozaar has been kept on hold secondary to worsening her renal function; reassess stability for resumption if appropriate. 5. -Metformin was discontinued secondary abnormal renal function and increased risk for lactic acidosis.   Discharge Diagnoses:  Principal Problem:   Facial droop   Chronic permanent A. Fib   Severe obesity (BMI >= 40) (HCC)   PULMONARY HYPERTENSION   Congestive heart failure (HCC)   Diabetic nephropathy associated with type 2 diabetes mellitus ([place])   Current use of long term anticoagulation   Dyspnea   TIA (transient ischemic attack)   Acute renal failure superimposed on stage 3 chronic kidney disease ([place])   Discharge Condition: Stable and improved.  Patient discharged home with instruction to follow-up with PCP in 10 days.  Diet recommendation: Heart healthy, modified carbohydrate and low-calorie diet.  Filed Weights   06/22/19 1244 06/23/19 0500 06/24/19 0500  Weight: 127 kg 135.8 kg 136 kg    History of present illness:  73 y.o.femalewith medical history significant forpulmonary hypertension, CHF, diabetes mellitus, atrial fibrillation,OSA and asthma,CVA, coronary artery disease, obesity BMI 46, patient presented to the ED with multiple complaints. Patient tells me last night and today her daughter noticed thattheright side of her face was droopy. This lasted about 10 minutes. She reports that the same pain that she had  left upper and lower extremity weakness,at this time she feels that herleftupper extremity still weak. She also reports blurred vision over the past 2 days, intermittent bouts currently she has had blurred vision involving her 2 eyes for the past 24 hours. She denies slurred speech. Patient also reports chest pain with exertion, that improves with rest. Chest pain radiates down her left arm. She describes the pain as an numbness. She denies difficulty breathing at rest or with exertion. She has chronic lower extremity swelling that is unchanged. She does not weigh herself daily but she tells me her regular weight is 127 kg. She reports compliance with her torsemide, low-salt diet and fluid restriction. Denies vomiting or loose stools, and has maintained good p.o. intake. No fever or chills. She is on 2 L of O2 mostly at night.  ED Course:Heart rate 50s to 60s, blood pressure systolic 973Z to 329J, O2 sats 96% on nasal cannula. Creatinine elevated 1.75, baseline 1.1-1.2.Potassium elevated 5.2. Chronic anemia hemoglobin 9.7. Mildly low platelets 107. BNP elevated 187. T waves appear peaked but last EKG was from2018.Head CT shows mild to moderate chronic ischemic changes. There is new mild interstitial pulmonary edema with slightly increased small right pleural effusion. New trace left pleural effusion. Hospitalist to admit for decompensated CHF and TIA/stroke work-up.   Hospital Course:  1-right-sided weakness, blurred vision and facial droop: In the setting of TIA -Work-up demonstrating abnormal left karate artery patency -MRA has been ordered to further characterize appreciated defects; which demonstrated 40% plaque formation and antegrade flow on vertebral arteries. -Continue Eliquis for secondary prevention -A1c 7.2; LDL 82 -Continue risk factor modifications. -Will follow with neurology service in 6 weeks after discharge.  2-acute on chronic diastolic heart  failure -Continue daily weights and low-sodium diet -Patient breathing back to baseline; no crackles on exam -Good urine output reported -Patient will be discharged back on Demadex at adjusted dose and close follow-up to further assess volume status and adjust diuretics as needed. -Continue outpatient follow-up with cardiology service as previously instructed.  3-acute on chronic renal failure: Stage III at baseline -Creatinine improved and at discharge 1.6 -Will continue holding Cozaar at time of discharge -Demadex dose has been adjusted -Patient advised to follow low-sodium diet. -Repeat basic metabolic panel follow-up visit to reassess renal function trend.  4-Severe obesity (BMI >= 40) (HCC) -Body mass index is 49.82 kg/m. -Low calorie diet, portion control and increase physical activity discussed with patient.  5-type 2 diabetes mellitus with nephropathy -Resume home hypoglycemic medications with adjusted dose on her long-acting insulin for better CBGs control -Metformin given worsening in renal function this medication has been kept discontinue at this time. -A1c 7.2. -Continue modify carbohydrates. -Close follow-up the patient's CBGs and A1c as an outpatient to further adjust hypoglycemic regimen as needed.  6-hyperkalemia -Will kept Cozaar on hold and discontinue potassium supplementation at discharge. -No abnormalities appreciated on telemetry  -Repeat basic metabolic panel at follow-up visit to reassess electrolytes stability.  7-Chronic permanent atrial fibrillation -Continue Cardizem -Continue Eliquis. -rate controlled.   8-chronic resp failure due to chronic asthma and pulmonary HTN  -Continue volume control -Continue oxygen supplementation -Concerns for hypoventilation syndrome due to obesity also playing a role.   Procedures:  See below for x-ray reports.  Consultations:  Neurology  Discharge Exam: Vitals:   06/24/19 0745 06/24/19 1145  BP: (!)  132/44 (!) 137/54  Pulse: 71 67  Resp: 18 17  Temp: 97.9 F (36.6 C) 97.9 F (36.6 C)  SpO2:     General exam: Alert, awake, oriented x 3; patient reports no weakness at this moment, expressed breathing is a stable and reports good urine output.  No chest pain.  She wants to go home. Respiratory system: Decreased breath sounds at the bases, no wheezing, normal respiratory effort.  No frank crackles. Good oxygen saturation on chronic 2 L nasal cannula oxygen supplementation. Cardiovascular system:Rate controlled, no rubs, no gallops, no murmurs.  Gastrointestinal system: Abdomen is nondistended, soft and nontender. No organomegaly or masses felt. Normal bowel sounds heard. Central nervous system: Alert and oriented. No focal neurological deficits. Extremities: Chronic lymphedema (++ II swelling); no open wounds. Psychiatry: Judgement and insight appear normal. Mood & affect appropriate.    Discharge Instructions   Discharge Instructions    Diet - low sodium heart healthy   Complete by: As directed    Discharge instructions   Complete by: As directed    Follow low sodium diet (< 2 gram daily) and modified carbohydrates diet. Maintain adequate hydration Take medications as prescribed Follow up with PCP in 10 days Follow up with neurology service in 6 weeks. Check weight on daily basis.   Increase activity slowly   Complete by: As directed      Allergies as of 06/24/2019      Reactions   Lisinopril    R/t to kidney   Codeine Nausea And Vomiting      Medication List    STOP taking these medications   metFORMIN 750 MG 24 hr tablet Commonly known as: GLUCOPHAGE-XR   potassium chloride SA 20 MEQ tablet Commonly known as: Klor-Con M20     TAKE these medications   acetaminophen 500 MG tablet Commonly known as: TYLENOL Take  500 mg by mouth every 4 (four) hours as needed.   albuterol 108 (90 Base) MCG/ACT inhaler Commonly known as: VENTOLIN HFA Inhale 2 puffs into the  lungs every 6 (six) hours as needed for wheezing or shortness of breath.   alendronate 70 MG tablet Commonly known as: FOSAMAX Take 1 tablet (70 mg total) by mouth every 7 (seven) days. What changed: when to take this   apixaban 2.5 MG Tabs tablet Commonly known as: ELIQUIS Take 1 tablet (2.5 mg total) by mouth 2 (two) times daily.   diltiazem 180 MG 24 hr capsule Commonly known as: CARDIZEM CD TAKE 1 CAPSULE (180 MG TOTAL) BY MOUTH DAILY. What changed:   how much to take  how to take this  when to take this  additional instructions   ferrous sulfate 325 (65 FE) MG tablet Take 1 tablet (325 mg total) by mouth daily with breakfast.   fish oil-omega-3 fatty acids 1000 MG capsule Take 2 g by mouth daily.   fluocinonide-emollient 0.05 % cream Commonly known as: LIDEX-E Apply 1 application topically 2 (two) times daily. To affected areas What changed: additional instructions   gabapentin 300 MG capsule Commonly known as: NEURONTIN 1 at bedtime for1 week then 2  The next  week then 3 the next week then 4 daily. What changed:   how much to take  how to take this  when to take this  additional instructions   ipratropium-albuterol 0.5-2.5 (3) MG/3ML Soln Commonly known as: DUONEB Inhale 3 mLs into the lungs every 6 (six) hours as needed (FOR SHORTNESS OF BREATH).   Levemir FlexTouch 100 UNIT/ML Pen Generic drug: Insulin Detemir Inject 40 Units into the skin 2 (two) times daily. What changed:   when to take this  additional instructions   levothyroxine 50 MCG tablet Commonly known as: SYNTHROID Take 1 tablet (50 mcg total) by mouth daily.   losartan 50 MG tablet Commonly known as: COZAAR Take 1 tablet (50 mg total) by mouth daily. Hold medication until follow up with PCP What changed: additional instructions   OXYGEN Inhale 2 L into the lungs continuous.   torsemide 20 MG tablet Commonly known as: DEMADEX Take 3 tablets (60 mg total) by mouth  daily. What changed: how much to take   trazodone 300 MG tablet Commonly known as: DESYREL Take 1 tablet (300 mg total) by mouth at bedtime.   Trulicity 5.09 TO/6.7TI Sopn Generic drug: Dulaglutide INJECT 0.75 MG INTO THE SKIN ONCE A WEEK. What changed:   how much to take  how to take this  when to take this  additional instructions            Durable Medical Equipment  (From admission, onward)         Start     Ordered   06/24/19 1337  For home use only DME oxygen  Once    Question Answer Comment  Length of Need Lifetime   Mode or (Route) Nasal cannula   Liters per Minute 2   Frequency Continuous (stationary and portable oxygen unit needed)   Oxygen conserving device Yes   Oxygen delivery system Gas      06/24/19 1336         Allergies  Allergen Reactions  . Lisinopril     R/t to kidney  . Codeine Nausea And Vomiting   Follow-up Information    Claretta Fraise, MD. Schedule an appointment as soon as possible for a visit in 10 day(s).  Specialty: Family Medicine Contact information: [place] [address] [phone]        Minus Breeding, MD .   Specialty: Cardiology Contact information: [place] [address] [phone]           The results of significant diagnostics from this hospitalization (including imaging, microbiology, ancillary and laboratory) are listed below for reference.    Significant Diagnostic Studies: Dg Chest 2 View  Result Date: 06/22/2019 CLINICAL DATA:  Shortness of breath with exertion. EXAM: CHEST - 2 VIEW COMPARISON:  Chest x-ray dated January 21, 2018. FINDINGS: Stable cardiomegaly. Chronically coarsened interstitial markings with new superimposed mid to lower lobe predominant interstitial thickening. Slightly increased small right pleural effusion with worsening right basilar atelectasis. New trace left pleural effusion. No pneumothorax. No acute osseous abnormality. IMPRESSION: 1.  New mild interstitial pulmonary edema with slightly increased small right pleural effusion. New trace left pleural effusion. Electronically Signed   By: Titus Dubin M.D.   On: 06/22/2019 15:05   Ct Head Wo Contrast  Result Date: 06/22/2019 CLINICAL DATA:  Facial droop last night. Blurry vision and dizziness. EXAM: CT HEAD WITHOUT CONTRAST TECHNIQUE: Contiguous axial images were obtained from the base of the skull through the vertex without intravenous contrast. COMPARISON:  CT head dated November 20, 2008. FINDINGS: Brain: No evidence of acute infarction, hemorrhage, hydrocephalus, extra-axial collection or mass lesion/mass effect. Slightly progressed mild to moderate chronic microvascular ischemic changes. Vascular: Calcified atherosclerosis at the skullbase. No hyperdense vessel. Skull: Negative for fracture or focal lesion. Sinuses/Orbits: No acute finding. Other: None. IMPRESSION: 1.  No acute intracranial abnormality. 2. Slightly progressed mild to moderate chronic microvascular ischemic changes. Electronically Signed   By: Titus Dubin M.D.   On: 06/22/2019 15:09   Mr Angio Neck Wo Contrast  Result Date: 06/23/2019 CLINICAL DATA:  Whole. EXAM: MRA NECK WITHOUT  CONTRAST TECHNIQUE: Multiplanar and multiecho pulse sequences of the neck were obtained without intravenous contrast. Angiographic images of the neck were obtained using MRA technique without and with intravenous contrast. COMPARISON:  Prior carotid ultrasound from earlier the same day. FINDINGS: Aortic arch and origin of the great vessels not assessed on this exam. Right common carotid artery patent to the bifurcation without hemodynamically significant stenosis. Atheromatous irregularity with up to 40% stenosis noted at the origin of the right ICA (series 4, image 57). Right ICA otherwise patent distally to the skull base without stenosis or occlusion. Visualized left common carotid artery patent without stenosis or occlusion.  Atheromatous irregularity about the left bifurcation/proximal left ICA. Relatively mild no more than 20% narrowing about the origin of the left ICA by MRA (series 4, image 58). Distally, left ICA patent to the skull base without additional stenosis or vascular occlusion. Both vertebral arteries appear to arise from the subclavian arteries. No appreciable stenosis about the visualized subclavian arteries. Vertebral arteries patent within the neck without stenosis or vascular occlusion. Left vertebral artery slightly dominant. IMPRESSION: 1. Atheromatous/proximal right ICA with associated stenosis of up to approximately 40% by NASCET criteria. The right carotid bifurcation 2. Mild atheromatous irregularity about the left carotid bifurcation/proximal left ICA with relatively mild no more than 20% stenosis by NASCET criteria. No significant stenosis seen to correspond with findings seen on prior Doppler ultrasound. 3. Wide patency of the vertebral arteries within the neck. Electronically Signed   By: Jeannine Boga M.D.   On: 06/23/2019 19:13   Mr Brain Wo Contrast  Result Date: 06/22/2019 CLINICAL DATA:  Neuro deficit. Dizziness blurred vision facial droop EXAM: MRI HEAD WITHOUT CONTRAST TECHNIQUE: Multiplanar, multiecho pulse sequences of the brain and surrounding structures were obtained without intravenous contrast. COMPARISON:  CT head 06/22/2019 FINDINGS: Brain: Negative for acute infarct. Moderate chronic microvascular ischemic changes in the white matter. Ventricle size normal. Negative for hemorrhage mass or fluid collection. No midline shift. Vascular: Normal arterial flow void Skull and upper cervical spine: Negative Sinuses/Orbits: Paranasal sinuses clear.  Bilateral cataract surgery Other: None IMPRESSION: Negative for acute infarct. Moderate chronic microvascular ischemic changes in the white matter. Electronically Signed   By: Franchot Gallo M.D.   On: 06/22/2019 18:27   US Carotid  Bilateral  Result Date: 06/23/2019 CLINICAL DATA:  Right sided weakness. Blurred vision. History of CAD, atrial fibrillation, hypertension, hyperlipidemia and diabetes. EXAM: BILATERAL CAROTID DUPLEX ULTRASOUND TECHNIQUE: Pearline Cables scale imaging, color Doppler and duplex ultrasound were performed of bilateral carotid and vertebral arteries in the neck. COMPARISON:  None. FINDINGS: Criteria: Quantification of carotid stenosis is based on velocity parameters that correlate the residual internal carotid diameter with NASCET-based stenosis levels, using the diameter of the distal internal carotid lumen as the denominator for stenosis measurement. The following velocity measurements were obtained: RIGHT ICA: 113/22 cm/sec CCA: 767/34 cm/sec SYSTOLIC ICA/CCA RATIO:  1.0 ECA: 109 cm/sec LEFT ICA: 132/29 cm/sec CCA: 193/79 cm/sec SYSTOLIC ICA/CCA RATIO:  1.3 ECA: 116 cm/sec RIGHT CAROTID ARTERY: There is a moderate amount of eccentric echogenic plaque within the right carotid bulb (image 17), extending to involve the origin and proximal aspects of the right internal carotid artery (image 24), not resulting in elevated peak systolic velocities within the interrogated course of the right internal carotid artery to suggest a hemodynamically significant stenosis. RIGHT VERTEBRAL ARTERY:  Antegrade Flow LEFT CAROTID ARTERY: There is a large amount of eccentric echogenic plaque within the left carotid bulb (images 48 and 50), extending to involve the origin and proximal aspects of the left internal carotid artery (image 58) which results in borderline elevated peak systolic velocities within the distal aspect the left internal carotid artery. Greatest acquired peak systolic velocity within distal left ICA measures 132 centimeters/second (image 66). LEFT VERTEBRAL ARTERY:  Antegrade Flow Note is made of a cardiac arrhythmia compatible provided history of atrial fibrillation. IMPRESSION: 1. Large amount of left-sided atherosclerotic  plaque results in elevated peak systolic velocities within the left internal carotid artery compatible with the 50-69% luminal narrowing range. Further evaluation with CTA could be performed as clinically indicated. 2. Moderate amount of right-sided atherosclerotic plaque, not resulting in a hemodynamically significant stenosis. Electronically Signed   By: Sandi Mariscal M.D.   On: 06/23/2019 10:53   Microbiology: Recent Results (from the past 240 hour(s))  SARS Coronavirus 2 (CEPHEID - Performed in [facility] hospital lab), Hosp Order     Status: None   Collection Time: 06/22/19  2:30 PM   Specimen: Nasopharyngeal Swab  Result Value Ref Range Status   SARS Coronavirus 2 NEGATIVE NEGATIVE Final    Comment: (NOTE) If result is NEGATIVE SARS-CoV-2 target nucleic acids are NOT DETECTED. The SARS-CoV-2 RNA is generally detectable in upper and lower  respiratory specimens during the acute phase of infection. The lowest  concentration of SARS-CoV-2 viral copies this assay can detect is 250  copies / mL. A negative result does not preclude SARS-CoV-2 infection  and should not be used as the sole basis for treatment or other  patient management decisions.  A negative result may occur with  improper  specimen collection / handling, submission of specimen other  than nasopharyngeal swab, presence of viral mutation(s) within the  areas targeted by this assay, and inadequate number of viral copies  (<250 copies / mL). A negative result must be combined with clinical  observations, patient history, and epidemiological information. If result is POSITIVE SARS-CoV-2 target nucleic acids are DETECTED. The SARS-CoV-2 RNA is generally detectable in upper and lower  respiratory specimens dur ing the acute phase of infection.  Positive  results are indicative of active infection with SARS-CoV-2.  Clinical  correlation with patient history and other diagnostic information is  necessary to determine patient  infection status.  Positive results do  not rule out bacterial infection or co-infection with other viruses. If result is PRESUMPTIVE POSTIVE SARS-CoV-2 nucleic acids MAY BE PRESENT.   A presumptive positive result was obtained on the submitted specimen  and confirmed on repeat testing.  While 2019 novel coronavirus  (SARS-CoV-2) nucleic acids may be present in the submitted sample  additional confirmatory testing may be necessary for epidemiological  and / or clinical management purposes  to differentiate between  SARS-CoV-2 and other Sarbecovirus currently known to infect humans.  If clinically indicated additional testing with an alternate test  methodology [phone]) is advised. The SARS-CoV-2 RNA is generally  detectable in upper and lower respiratory sp ecimens during the acute  phase of infection. The expected result is Negative. Fact Sheet for Patients:  StrictlyIdeas.no Fact Sheet for Healthcare Providers: BankingDealers.co.za This test is not yet approved or cleared by the [place] FDA and has been authorized for detection and/or diagnosis of SARS-CoV-2 by FDA under an Emergency Use Authorization (EUA).  This EUA will remain in effect (meaning this test can be used) for the duration of the COVID-19 declaration under Section 564(b)(1) of the Act, 21 U.S.C. section 360bbb-3(b)(1), unless the authorization is terminated or revoked sooner. Performed at [facility], [address] [place], [place] [address]      Labs: Basic Metabolic Panel: Recent Labs  Lab 06/22/19 1452 06/23/19 0556 06/24/19 0750  NA 138 140 139  K 5.2* 5.5* 5.4*  CL 106 105 102  CO2 25 28 24   GLUCOSE 168* 174* 131*  BUN 32* 32* 32*  CREATININE 1.75* 1.63* 1.64*  CALCIUM 8.7* 9.0 9.1   Liver Function Tests: Recent Labs  Lab 06/22/19 1452  AST 16  ALT 18  ALKPHOS 170*  BILITOT 1.0  PROT 6.8  ALBUMIN 3.6   CBC: Recent Labs  Lab  06/22/19 1452  WBC 5.8  NEUTROABS 4.5  HGB 9.7*  HCT 33.0*  MCV 104.1*  PLT 107*   BNP (last 3 results) Recent Labs    12/20/18 1700 06/22/19 1452  BNP 212.5* 187.0*    CBG: Recent Labs  Lab 06/23/19 1101 06/23/19 1614 06/23/19 2158 06/24/19 0732 06/24/19 1107  GLUCAP 173* 130* 146* 124* 160*    Signed:  Barton Dubois MD.  [facility] 06/24/2019, 2:33 PM

## 2019-06-24 NOTE — TOC Transition Note (Signed)
Transition of Care [facility]) - CM/SW Discharge Note   Patient Details  Name: [name] MRN: [id number] Date of Birth: Aug 11, 1946  Transition of Care [facility]) CM/SW Contact:  Ihor Gully, LCSW Phone Number: 06/24/2019, 4:10 PM   Clinical Narrative:    RN to complete qualifying oxygen stat note for patient for portable concentrator. LCSW signing off.    Final next level of care: [place] Barriers to Discharge: No Barriers Identified   Patient Goals and CMS Choice Patient states their goals for this hospitalization and ongoing recovery are:: To return home to her baseline. CMS Medicare.gov Compare Post Acute Care list provided to:: Patient Choice offered to / list presented to : Patient  Discharge Placement                       Discharge Plan and Services     Post Acute Care Choice: Home Health          DME Arranged: Oxygen(portable concentrator) DME Agency: [employer] Date DME Agency Contacted: 06/24/19 Time DME Agency Contacted: [phone] Representative spoke with at DME Agency: [place]: PT [place]: [place] ([place]) Date [place]: 06/23/19 Time [place]: 1212 Representative spoke with at [place]: [place] ([place]) Interventions     Readmission Risk Interventions No flowsheet data found.

## 2019-06-24 NOTE — Progress Notes (Signed)
Physical Therapy Treatment Patient Details Name: [name] MRN: [id number] DOB: 1946/07/15 Today's Date: 06/24/2019    History of Present Illness [name] is a 73 y.o. female with medical history significant for pulmonary hypertension, CHF, diabetes mellitus, atrial fibrillation, OSA and asthma, CVA, coronary artery disease, obesity BMI 46, patient presented to the ED with multiple complaints.Patient tells me last night and today her daughter noticed that the right side of her face was droopy.  This lasted about 10 minutes.  She reports that the same pain that she had left upper and lower extremity weakness, at this time she feels that her left upper extremity still weak.  She also reports blurred vision over the past 2 days, intermittent bouts currently she has had blurred vision involving her 2 eyes for the past 24 hours.  She denies slurred speech.Patient also reports chest pain with exertion, that improves with rest.  Chest pain radiates down her left arm.  She describes the pain as an numbness.She denies difficulty breathing at rest or with exertion.  She has chronic lower extremity swelling that is unchanged.  She does not weigh herself daily but she tells me her regular weight is 127 kg.  She reports compliance with her torsemide, low-salt diet and fluid restriction.  Denies vomiting or loose stools, and has maintained good p.o. intake.  No fever or chills.  She is on 2 L of O2 mostly at night.    PT Comments    Patient agreeable for therapy, had difficulty moving BLE during bed mobility due to generalized weakness, demonstrated increased endurance/distance for gait training without loss of balance, on 2 LPM O2 with SpO2 dropping from 90-91% to 87% during ambulation.  Patient demonstrates good return for completing BLE exercises and tolerated sitting up in chair after therapy - RN aware.  Patient will benefit from continued physical therapy in hospital and recommended venue below  to increase strength, balance, endurance for safe ADLs and gait.   Follow Up Recommendations  Home health PT;Supervision for mobility/OOB;Supervision - Intermittent     Equipment Recommendations  None recommended by PT    Recommendations for Other Services       Precautions / Restrictions Precautions Precautions: Fall Restrictions Weight Bearing Restrictions: No    Mobility  Bed Mobility Overal bed mobility: Needs Assistance Bed Mobility: Supine to Sit     Supine to sit: Min guard;Min assist     General bed mobility comments: slow labored movment, had diffiuclty moving legs off bedside  Transfers Overall transfer level: Needs assistance Equipment used: Rolling walker (2 wheeled) Transfers: Sit to/from [facility] Sit to Stand: Supervision;Min guard Stand pivot transfers: Supervision;Min guard       General transfer comment: labored movement with multiple attempts completing sit to stand from bedside with bed low, good return for transferring to and from commode in bathroom using grab bar/RW  Ambulation/Gait Ambulation/Gait assistance: Supervision Gait Distance (Feet): 55 Feet Assistive device: Rolling walker (2 wheeled) Gait Pattern/deviations: Decreased step length - right;Decreased step length - left;Decreased stride length Gait velocity: decreased   General Gait Details: increased endurance/distance for ambulation with slightly labored cadence without loss of balance, on 2 LPM O2 with SpO2 at 87%, limited secondary to c/o fatigue   Stairs             Wheelchair Mobility    Modified Rankin (Stroke Patients Only)       Balance Overall balance assessment: Needs assistance Sitting-balance support: Feet supported;No upper extremity  supported Sitting balance-Leahy Scale: Good     Standing balance support: During functional activity;Bilateral upper extremity supported Standing balance-Leahy Scale: Fair Standing balance comment:  using RW                            Cognition Arousal/Alertness: Awake/alert Behavior During Therapy: WFL for tasks assessed/performed Overall Cognitive Status: Within Functional Limits for tasks assessed                                        Exercises General Exercises - Lower Extremity Long Arc Quad: Seated;AROM;Strengthening;Both;10 reps Hip Flexion/Marching: Seated;AROM;Strengthening;Both;10 reps Toe Raises: Seated;AROM;Strengthening;Both;10 reps Heel Raises: Seated;AROM;Strengthening;Both;10 reps    General Comments        Pertinent Vitals/Pain Pain Assessment: No/denies pain    Home Living                      Prior Function            PT Goals (current goals can now be found in the care plan section) Acute Rehab PT Goals Patient Stated Goal: return home with family to assist PT Goal Formulation: With patient Time For Goal Achievement: 06/27/19 Potential to Achieve Goals: Good Progress towards PT goals: Progressing toward goals    Frequency    Min 3X/week      PT Plan Current plan remains appropriate    Co-evaluation              AM-PAC PT "6 Clicks" Mobility   Outcome Measure  Help needed turning from your back to your side while in a flat bed without using bedrails?: None Help needed moving from lying on your back to sitting on the side of a flat bed without using bedrails?: A Little Help needed moving to and from a bed to a chair (including a wheelchair)?: A Little Help needed standing up from a chair using your arms (e.g., wheelchair or bedside chair)?: A Little Help needed to walk in hospital room?: A Little Help needed climbing 3-5 steps with a railing? : A Lot 6 Click Score: 18    End of Session Equipment Utilized During Treatment: Oxygen Activity Tolerance: Patient tolerated treatment well;Patient limited by fatigue Patient left: in chair;with call bell/phone within reach Nurse Communication:  Mobility status PT Visit Diagnosis: Unsteadiness on feet (R26.81);Other abnormalities of gait and mobility (R26.89);Muscle weakness (generalized) (M62.81)     Time: 1610-9604 PT Time Calculation (min) (ACUTE ONLY): 34 min  Charges:  $Gait Training: 8-22 mins $Therapeutic Exercise: 8-22 mins                     12:35 PM, 06/24/19 Lonell Grandchild, MPT Physical Therapist with [facility] [phone] office [phone] mobile phone

## 2019-06-24 NOTE — Progress Notes (Signed)
SATURATION QUALIFICATIONS: (This note is used to comply with regulatory documentation for home oxygen)  Patient Saturations on Room Air at Rest = 92%  Patient Saturations on Room Air while Ambulating = 88%  Patient Saturations on 2 Liters of oxygen while Ambulating = 94%  Please briefly explain why patient needs home oxygen:  O2 SAT DROPS WITH AMBULATION.

## 2019-06-27 DIAGNOSIS — Z7722 Contact with and (suspected) exposure to environmental tobacco smoke (acute) (chronic): Secondary | ICD-10-CM | POA: Diagnosis not present

## 2019-06-27 DIAGNOSIS — Z9989 Dependence on other enabling machines and devices: Secondary | ICD-10-CM | POA: Diagnosis not present

## 2019-06-27 DIAGNOSIS — Z955 Presence of coronary angioplasty implant and graft: Secondary | ICD-10-CM | POA: Diagnosis not present

## 2019-06-27 DIAGNOSIS — I272 Pulmonary hypertension, unspecified: Secondary | ICD-10-CM | POA: Diagnosis not present

## 2019-06-27 DIAGNOSIS — Z8673 Personal history of transient ischemic attack (TIA), and cerebral infarction without residual deficits: Secondary | ICD-10-CM | POA: Diagnosis not present

## 2019-06-27 DIAGNOSIS — I5033 Acute on chronic diastolic (congestive) heart failure: Secondary | ICD-10-CM | POA: Diagnosis not present

## 2019-06-27 DIAGNOSIS — D692 Other nonthrombocytopenic purpura: Secondary | ICD-10-CM | POA: Diagnosis not present

## 2019-06-27 DIAGNOSIS — J449 Chronic obstructive pulmonary disease, unspecified: Secondary | ICD-10-CM | POA: Diagnosis not present

## 2019-06-27 DIAGNOSIS — Z87440 Personal history of urinary (tract) infections: Secondary | ICD-10-CM | POA: Diagnosis not present

## 2019-06-27 DIAGNOSIS — E1169 Type 2 diabetes mellitus with other specified complication: Secondary | ICD-10-CM | POA: Diagnosis not present

## 2019-06-27 DIAGNOSIS — E039 Hypothyroidism, unspecified: Secondary | ICD-10-CM | POA: Diagnosis not present

## 2019-06-27 DIAGNOSIS — I130 Hypertensive heart and chronic kidney disease with heart failure and stage 1 through stage 4 chronic kidney disease, or unspecified chronic kidney disease: Secondary | ICD-10-CM | POA: Diagnosis not present

## 2019-06-27 DIAGNOSIS — I2510 Atherosclerotic heart disease of native coronary artery without angina pectoris: Secondary | ICD-10-CM | POA: Diagnosis not present

## 2019-06-27 DIAGNOSIS — J9610 Chronic respiratory failure, unspecified whether with hypoxia or hypercapnia: Secondary | ICD-10-CM | POA: Diagnosis not present

## 2019-06-27 DIAGNOSIS — I252 Old myocardial infarction: Secondary | ICD-10-CM | POA: Diagnosis not present

## 2019-06-27 DIAGNOSIS — I25118 Atherosclerotic heart disease of native coronary artery with other forms of angina pectoris: Secondary | ICD-10-CM | POA: Diagnosis not present

## 2019-06-27 DIAGNOSIS — N183 Chronic kidney disease, stage 3 (moderate): Secondary | ICD-10-CM | POA: Diagnosis not present

## 2019-06-27 DIAGNOSIS — Z7901 Long term (current) use of anticoagulants: Secondary | ICD-10-CM | POA: Diagnosis not present

## 2019-06-27 DIAGNOSIS — E1151 Type 2 diabetes mellitus with diabetic peripheral angiopathy without gangrene: Secondary | ICD-10-CM | POA: Diagnosis not present

## 2019-06-27 DIAGNOSIS — E1121 Type 2 diabetes mellitus with diabetic nephropathy: Secondary | ICD-10-CM | POA: Diagnosis not present

## 2019-06-27 DIAGNOSIS — E1122 Type 2 diabetes mellitus with diabetic chronic kidney disease: Secondary | ICD-10-CM | POA: Diagnosis not present

## 2019-06-27 DIAGNOSIS — Z794 Long term (current) use of insulin: Secondary | ICD-10-CM | POA: Diagnosis not present

## 2019-06-28 ENCOUNTER — Other Ambulatory Visit: Payer: Self-pay

## 2019-06-28 NOTE — Patient Outreach (Signed)
[facility]) Care Management  06/28/2019  [name] 01-27-1946 [id number]  EMMI: general discharge red alert Referral date: 06/28/19 Referral reason: Got discharge papers: no,  Know who to call about changes in condition: no Insurance:  [employer] Day # 1  Attempt #1 Telephone call to patient regarding EMMI general discharge red alert. Unable to reach patient or leave voice message.  Message states voice mail box has not been set up yet.  PLAN: RNCm will attempt 2nd telephone call to patient within 4 business days.  RNCM will send patient outreach letter to attempt contact  Quinn Plowman RN,BSN,CCM [facility] Telephonic  [phone]

## 2019-06-29 ENCOUNTER — Other Ambulatory Visit: Payer: Self-pay

## 2019-06-29 DIAGNOSIS — I2510 Atherosclerotic heart disease of native coronary artery without angina pectoris: Secondary | ICD-10-CM | POA: Diagnosis not present

## 2019-06-29 DIAGNOSIS — N183 Chronic kidney disease, stage 3 (moderate): Secondary | ICD-10-CM | POA: Diagnosis not present

## 2019-06-29 DIAGNOSIS — I5033 Acute on chronic diastolic (congestive) heart failure: Secondary | ICD-10-CM | POA: Diagnosis not present

## 2019-06-29 DIAGNOSIS — Z8673 Personal history of transient ischemic attack (TIA), and cerebral infarction without residual deficits: Secondary | ICD-10-CM | POA: Diagnosis not present

## 2019-06-29 DIAGNOSIS — J9610 Chronic respiratory failure, unspecified whether with hypoxia or hypercapnia: Secondary | ICD-10-CM | POA: Diagnosis not present

## 2019-06-29 DIAGNOSIS — I130 Hypertensive heart and chronic kidney disease with heart failure and stage 1 through stage 4 chronic kidney disease, or unspecified chronic kidney disease: Secondary | ICD-10-CM | POA: Diagnosis not present

## 2019-06-29 DIAGNOSIS — E1122 Type 2 diabetes mellitus with diabetic chronic kidney disease: Secondary | ICD-10-CM | POA: Diagnosis not present

## 2019-06-29 DIAGNOSIS — J449 Chronic obstructive pulmonary disease, unspecified: Secondary | ICD-10-CM | POA: Diagnosis not present

## 2019-06-29 DIAGNOSIS — I272 Pulmonary hypertension, unspecified: Secondary | ICD-10-CM | POA: Diagnosis not present

## 2019-06-29 NOTE — Patient Outreach (Signed)
[place] [facility]) Care Management  06/29/2019  [name] November 21, 1946 [id number]  EMMI: general discharge red alert Referral date: 06/28/19 Referral reason: Got discharge papers: no,  Know who to call about changes in condition: no Insurance:  [employer] Day # 1  Telephone call to patient regarding EMMI general discharge red alert. HIPAA verified with patient. RNCM introduced herself and explained reason for call.  Patient states she was in the hospital because her potassium dropped to low.  She states she is doing pretty good since she has been home from the hospital. Patient denies any new symptoms since discharge. Denies any shortness of breath.  Patient states she received her discharge paperwork and her daughter reads this for her.  Patient states she know to follow up with her primary MD for any ongoing or new symptoms.   Patient states she has a telephone hospital follow up visit with her primary MD on 06/30/19.  Patient states she has transportation if she needs to get to doctor appointments. Patient reports having her medications and taking them as prescribed.  Patient states she was sent home from the hospital with her oxygen and portable tank.  Patient denies having any home health services. RNCM discussed and offered [id number] care management services. Patient states she does not feel she needs follow up at this time with a nurse. RNCM offered to mail patient [facility] care management brochure/ magnet. Patient verbally agreed [facility] advised patient to notify MD of any changes in condition prior to scheduled appointment. RNCM provided contact name and number: [phone] or main office number [phone] and 24 hour nurse advise line [phone] by mail as requested by patient. Marland Kitchen  RNCM verified patient aware of 911 services for urgent/ emergent needs.  PLAN: RNCM will close case due patient being assessed and having no further needs,.  RNCM will mail patient [facility] care management  brochure and magnet as discussed.   Quinn Plowman RN,BSN,CCM [facility] Telephonic  [phone]    .

## 2019-06-30 ENCOUNTER — Encounter: Payer: Self-pay | Admitting: *Deleted

## 2019-06-30 ENCOUNTER — Ambulatory Visit (INDEPENDENT_AMBULATORY_CARE_PROVIDER_SITE_OTHER): Payer: Medicare HMO | Admitting: *Deleted

## 2019-06-30 DIAGNOSIS — Z0000 Encounter for general adult medical examination without abnormal findings: Secondary | ICD-10-CM | POA: Diagnosis not present

## 2019-06-30 NOTE — Patient Instructions (Signed)
[name] , Thank you for taking time to come for your Medicare Wellness Visit. I appreciate your ongoing commitment to your health goals. Please review the following plan we discussed and let me know if I can assist you in the future.   These are the goals we discussed: Goals    . Exercise 150 min/wk Moderate Activity       This is a list of the screening recommended for you and due dates:  Health Maintenance  Topic Date Due  .  Hepatitis C: One time screening is recommended by Center for Disease Control  (CDC) for  adults born from [birth year] through 1967.   June 07, 1946  . Eye exam for diabetics  07/22/2017  . Complete foot exam   02/24/2019  . Flu Shot  07/02/2019  . Hemoglobin A1C  12/24/2019  . DEXA scan (bone density measurement)  01/22/2020  . Tetanus Vaccine  03/09/2022  . Pneumonia vaccines  Completed  . Colon Cancer Screening  Discontinued    Advance Directive  Advance directives are legal documents that let you make choices ahead of time about your health care and medical treatment in case you become unable to communicate for yourself. Advance directives are a way for you to communicate your wishes to family, friends, and health care providers. This can help convey your decisions about end-of-life care if you become unable to communicate. Discussing and writing advance directives should happen over time rather than all at once. Advance directives can be changed depending on your situation and what you want, even after you have signed the advance directives. If you do not have an advance directive, some states assign family decision makers to act on your behalf based on how closely you are related to them. Each state has its own laws regarding advance directives. You may want to check with your health care provider, attorney, or state representative about the laws in your state. There are different types of advance directives, such as:  Medical power of attorney.  Living will.   Do not resuscitate (DNR) or do not attempt resuscitation (DNAR) order. Health care proxy and medical power of attorney A health care proxy, also called a health care agent, is a person who is appointed to make medical decisions for you in cases in which you are unable to make the decisions yourself. Generally, people choose someone they know well and trust to represent their preferences. Make sure to ask this person for an agreement to act as your proxy. A proxy may have to exercise judgment in the event of a medical decision for which your wishes are not known. A medical power of attorney is a legal document that names your health care proxy. Depending on the laws in your state, after the document is written, it may also need to be:  Signed.  Notarized.  Dated.  Copied.  Witnessed.  Incorporated into your medical record. You may also want to appoint someone to manage your financial affairs in a situation in which you are unable to do so. This is called a durable power of attorney for finances. It is a separate legal document from the durable power of attorney for health care. You may choose the same person or someone different from your health care proxy to act as your agent in financial matters. If you do not appoint a proxy, or if there is a concern that the proxy is not acting in your best interests, a court-appointed guardian may be designated to  act on your behalf. Living will A living will is a set of instructions documenting your wishes about medical care when you cannot express them yourself. Health care providers should keep a copy of your living will in your medical record. You may want to give a copy to family members or friends. To alert caregivers in case of an emergency, you can place a card in your wallet to let them know that you have a living will and where they can find it. A living will is used if you become:  Terminally ill.  Incapacitated.  Unable to communicate or  make decisions. Items to consider in your living will include:  The use or non-use of life-sustaining equipment, such as dialysis machines and breathing machines (ventilators).  A DNR or DNAR order, which is the instruction not to use cardiopulmonary resuscitation (CPR) if breathing or heartbeat stops.  The use or non-use of tube feeding.  Withholding of food and fluids.  Comfort (palliative) care when the goal becomes comfort rather than a cure.  Organ and tissue donation. A living will does not give instructions for distributing your money and property if you should pass away. It is recommended that you seek the advice of a lawyer when writing a will. Decisions about taxes, beneficiaries, and asset distribution will be legally binding. This process can relieve your family and friends of any concerns surrounding disputes or questions that may come up about the distribution of your assets. DNR or DNAR A DNR or DNAR order is a request not to have CPR in the event that your heart stops beating or you stop breathing. If a DNR or DNAR order has not been made and shared, a health care provider will try to help any patient whose heart has stopped or who has stopped breathing. If you plan to have surgery, talk with your health care provider about how your DNR or DNAR order will be followed if problems occur. Summary  Advance directives are the legal documents that allow you to make choices ahead of time about your health care and medical treatment in case you become unable to communicate for yourself.  The process of discussing and writing advance directives should happen over time. You can change the advance directives, even after you have signed them.  Advance directives include DNR or DNAR orders, living wills, and designating an agent as your medical power of attorney. This information is not intended to replace advice given to you by your health care provider. Make sure you discuss any  questions you have with your health care provider. Document Released: 02/24/2008 Document Revised: 12/22/2018 Document Reviewed: 10/06/2016 Elsevier Patient Education  [name].    BMI for Adults  Body mass index (BMI) is a number that is calculated from a person's weight and height. BMI may help to estimate how much of a person's weight is composed of fat. BMI can help identify those who may be at higher risk for certain medical problems. How is BMI used with adults? BMI is used as a screening tool to identify possible weight problems. It is used to check whether a person is obese, overweight, healthy weight, or underweight. How is BMI calculated? BMI measures your weight and compares it to your height. This can be done either in [place] ([place] or metric measurements. Note that charts are available to help you find your BMI quickly and easily without having to do these calculations yourself. To calculate your BMI in [place] ([place] measurements, your  health care provider will: 1. Measure your weight in pounds (lb). 2. Multiply the number of pounds by 703. ? For example, for a person who weighs 180 lb, multiply that number by 703, which equals 126,540. 3. Measure your height in inches (in). Then multiply that number by itself to get a measurement called "inches squared." ? For example, for a person who is 70 in tall, the "inches squared" measurement is 70 in x 70 in, which equals 4900 inches squared. 4. Divide the total from Step 2 (number of lb x 703) by the total from Step 3 (inches squared): 126,540  4900 = 25.8. This is your BMI. To calculate your BMI in metric measurements, your health care provider will: 1. Measure your weight in kilograms (kg). 2. Measure your height in meters (m). Then multiply that number by itself to get a measurement called "meters squared." ? For example, for a person who is 1.75 m tall, the "meters squared" measurement is 1.75 m x 1.75 m, which is equal  to 3.1 meters squared. 3. Divide the number of kilograms (your weight) by the meters squared number. In this example: 70  3.1 = 22.6. This is your BMI. How is BMI interpreted? To interpret your results, your health care provider will use BMI charts to identify whether you are underweight, normal weight, overweight, or obese. The following guidelines will be used:  Underweight: BMI less than 18.5.  Normal weight: BMI between 18.5 and 24.9.  Overweight: BMI between 25 and 29.9.  Obese: BMI of 30 and above. Please note:  Weight includes both fat and muscle, so someone with a muscular build, such as an athlete, may have a BMI that is higher than 24.9. In cases like these, BMI is not an accurate measure of body fat.  To determine if excess body fat is the cause of a BMI of 25 or higher, further assessments may need to be done by a health care provider.  BMI is usually interpreted in the same way for men and women. Why is BMI a useful tool? BMI is useful in two ways:  Identifying a weight problem that may be related to a medical condition, or that may increase the risk for medical problems.  Promoting lifestyle and diet changes in order to reach a healthy weight. Summary  Body mass index (BMI) is a number that is calculated from a person's weight and height.  BMI may help to estimate how much of a person's weight is composed of fat. BMI can help identify those who may be at higher risk for certain medical problems.  BMI can be measured using English measurements or metric measurements.  To interpret your results, your health care provider will use BMI charts to identify whether you are underweight, normal weight, overweight, or obese. This information is not intended to replace advice given to you by your health care provider. Make sure you discuss any questions you have with your health care provider. Document Released: 07/29/2004 Document Revised: 10/30/2017 Document Reviewed:  09/30/2017 Elsevier Patient Education  2020 [employer].

## 2019-06-30 NOTE — Progress Notes (Signed)
MEDICARE ANNUAL WELLNESS VISIT  06/30/2019  Telephone Visit Disclaimer This Medicare AWV was conducted by telephone due to national recommendations for restrictions regarding the COVID-19 Pandemic (e.g. social distancing).  I verified, using two identifiers, that I am speaking with [name] or their authorized healthcare agent. I discussed the limitations, risks, security, and privacy concerns of performing an evaluation and management service by telephone and the potential availability of an in-person appointment in the future. The patient expressed understanding and agreed to proceed.   Subjective:  [name] is a 73 y.o. female patient of Stacks, Cletus Gash, MD who had a Medicare Annual Wellness Visit today via telephone. [name] is Retired and Disabled and lives with their family. she has 1 child. she reports that she is socially active and does interact with friends/family regularly. she is not physically active and enjoys puzzles.  Patient Care Team: Claretta Fraise, MD as PCP - General (Family Medicine) Minus Breeding, MD as PCP - Cardiology (Cardiology) Santo Held, MD as Consulting Physician (Gynecology) Minus Breeding, MD as Consulting Physician (Cardiology)  Advanced Directives 06/30/2019 06/22/2019 03/08/2018 11/20/2016 11/19/2015 05/06/2013  Does Patient Have a Medical Advance Directive? No No No No No Patient does not have advance directive;Patient would like information  Would patient like information on creating a medical advance directive? Yes (MAU/Ambulatory/Procedural Areas - Information given) No - Patient declined Yes (MAU/Ambulatory/Procedural Areas - Information given) Yes (MAU/Ambulatory/Procedural Areas - Information given) No - patient declined information Advance directive packet given  Pre-existing out of facility DNR order (yellow form or pink MOST form) - - - - - No    Hospital Utilization Over the Past 12 Months: # of hospitalizations or ER  visits: 2 # of surgeries: 0  Review of Systems    Patient reports that her overall health is unchanged compared to last year.  Patient Reported Readings (BP, Pulse, CBG, Weight, etc) CBG-174  Review of Systems: History obtained from chart review and the patient General ROS: negative  All other systems negative.  Pain Assessment Pain : No/denies pain Pain Score: 0-No pain     Current Medications & Allergies (verified) Allergies as of 06/30/2019      Reactions   Lisinopril    R/t to kidney   Codeine Nausea And Vomiting      Medication List       Accurate as of June 30, 2019 10:30 AM. If you have any questions, ask your nurse or doctor.        acetaminophen 500 MG tablet Commonly known as: TYLENOL Take 500 mg by mouth every 4 (four) hours as needed.   albuterol 108 (90 Base) MCG/ACT inhaler Commonly known as: VENTOLIN HFA Inhale 2 puffs into the lungs every 6 (six) hours as needed for wheezing or shortness of breath.   alendronate 70 MG tablet Commonly known as: FOSAMAX Take 1 tablet (70 mg total) by mouth every 7 (seven) days. What changed: when to take this   apixaban 2.5 MG Tabs tablet Commonly known as: ELIQUIS Take 1 tablet (2.5 mg total) by mouth 2 (two) times daily.   diltiazem 180 MG 24 hr capsule Commonly known as: CARDIZEM CD TAKE 1 CAPSULE (180 MG TOTAL) BY MOUTH DAILY. What changed:   how much to take  how to take this  when to take this  additional instructions   ferrous sulfate 325 (65 FE) MG tablet Take 1 tablet (325 mg total) by mouth daily with breakfast.   fish oil-omega-3  fatty acids 1000 MG capsule Take 2 g by mouth daily.   fluocinonide-emollient 0.05 % cream Commonly known as: LIDEX-E Apply 1 application topically 2 (two) times daily. To affected areas What changed: additional instructions   gabapentin 300 MG capsule Commonly known as: NEURONTIN 1 at bedtime for1 week then 2  The next  week then 3 the next week then 4  daily. What changed:   how much to take  how to take this  when to take this  additional instructions   ipratropium-albuterol 0.5-2.5 (3) MG/3ML Soln Commonly known as: DUONEB Inhale 3 mLs into the lungs every 6 (six) hours as needed (FOR SHORTNESS OF BREATH).   Levemir FlexTouch 100 UNIT/ML Pen Generic drug: Insulin Detemir Inject 40 Units into the skin 2 (two) times daily.   levothyroxine 50 MCG tablet Commonly known as: SYNTHROID Take 1 tablet (50 mcg total) by mouth daily.   losartan 50 MG tablet Commonly known as: COZAAR Take 1 tablet (50 mg total) by mouth daily. Hold medication until follow up with PCP   OXYGEN Inhale 2 L into the lungs continuous.   torsemide 20 MG tablet Commonly known as: DEMADEX Take 3 tablets (60 mg total) by mouth daily.   trazodone 300 MG tablet Commonly known as: DESYREL Take 1 tablet (300 mg total) by mouth at bedtime.   Trulicity 5.36 UY/4.0HK Sopn Generic drug: Dulaglutide INJECT 0.75 MG INTO THE SKIN ONCE A WEEK. What changed:   how much to take  how to take this  when to take this  additional instructions       History (reviewed): Past Medical History:  Diagnosis Date  . Anemia   . Arthritis   . Asthma   . Atrial fibrillation ([place])   . CAD (coronary artery disease)    LAD 60% proximal stenosis mid and distal 60% stenosis 2009.  . Cataract    had surgery  . CHF (congestive heart failure) ([place])   . COPD (chronic obstructive pulmonary disease) ([place])   . Diabetes mellitus   . Gait instability    uses cane  . GERD (gastroesophageal reflux disease)   . Hyperlipidemia   . Hypertension   . Obesity   . Oxygen dependent    2 liter per nasal cannula  . Sleep apnea, obstructive 2008  . Stroke [facility])    2 mini   Past Surgical History:  Procedure Laterality Date  . APPENDECTOMY    . CARPAL TUNNEL RELEASE     right hand  . CATARACT EXTRACTION W/ INTRAOCULAR LENS  IMPLANT, BILATERAL    . CERVICAL SPINE SURGERY      fusion  . CESAREAN SECTION    . CHOLECYSTECTOMY    . COLONOSCOPY  11/22/2002   normal - Dr.Rrourk  . COLONOSCOPY  11/26/2005   incomplete with negative BE (Dr. Rowe Pavy, [place])  . EYE SURGERY    . FEMUR IM NAIL Left 05/08/2013   Procedure: INTRAMEDULLARY (IM) NAIL FEMORAL--LONG(BIOMET SYSTEM) and Zimmer Cables;  Surgeon: Augustin Schooling, MD;  Location: [place];  Service: Orthopedics;  Laterality: Left;  . HIP SURGERY Left   . KNEE ARTHROSCOPY     twice, left  . UMBILICAL HERNIA REPAIR    . UPPER GASTROINTESTINAL ENDOSCOPY  11/22/2002   Normal - Dr. Gala Romney   Family History  Problem Relation Age of Onset  . Colon cancer Mother   . Heart attack Mother   . Alzheimer's disease Mother        father  .  Diabetes Mother   . Heart disease Mother   . Cancer Mother        colon  . Kidney disease Mother   . Cancer Sister        brain and spine  . Migraines Sister   . Alzheimer's disease Father   . Cancer Brother        stomach  . Diabetes Sister   . Cancer Sister        breast  . Heart disease Sister   . Heart attack Sister   . Breast cancer Sister   . Heart disease Brother   . Heart murmur Brother   . Diabetes Other   . Heart attack Other   . Diabetes Daughter   . Asthma Daughter   . Asthma Grandchild    Social History   Socioeconomic History  . Marital status: Widowed    Spouse name: Not on file  . Number of children: 1  . Years of education: 71th  . Highest education level: 7th grade  Occupational History  . Occupation: Disabled  . Occupation: Retired    Comment: [employer]  . Financial resource strain: Not very hard  . Food insecurity    Worry: Never true    Inability: Never true  . Transportation needs    Medical: No    Non-medical: No  Tobacco Use  . Smoking status: Never Smoker  . Smokeless tobacco: Never Used  Substance and Sexual Activity  . Alcohol use: No  . Drug use: No  . Sexual activity: Not Currently  Lifestyle  .  Physical activity    Days per week: 0 days    Minutes per session: 0 min  . Stress: Not at all  Relationships  . Social connections    Talks on phone: More than three times a week    Gets together: More than three times a week    Attends religious service: 1 to 4 times per year    Active member of club or organization: Yes    Attends meetings of clubs or organizations: More than 4 times per year    Relationship status: Widowed  Other Topics Concern  . Not on file  Social History Narrative  . Not on file    Activities of Daily Living In your present state of health, do you have any difficulty performing the following activities: 06/30/2019 06/22/2019  Hearing? N N  Vision? Y Y  Comment Wears Glasses -  Difficulty concentrating or making decisions? N N  Walking or climbing stairs? Y N  Comment Uses walker -  Dressing or bathing? N N  Doing errands, shopping? Y N  Preparing Food and eating ? N -  Using the Toilet? N -  In the past six months, have you accidently leaked urine? N -  Do you have problems with loss of bowel control? N -  Managing your Medications? N -  Managing your Finances? N -  Housekeeping or managing your Housekeeping? N -  Some recent data might be hidden    Patient Education/ Literacy How often do you need to have someone help you when you read instructions, pamphlets, or other written materials from your doctor or pharmacy?: 1 - Never What is the last grade level you completed in school?: 7th Grade  Exercise Current Exercise Habits: The patient does not participate in regular exercise at present, Exercise limited by: orthopedic condition(s)  Diet Patient reports consuming 3 meals a  day and 3 snack(s) a day Patient reports that her primary diet is: Diabetic Patient reports that she does have regular access to food.   Depression Screen PHQ 2/9 Scores 06/30/2019 01/03/2019 09/08/2018 07/05/2018 05/28/2018 05/18/2018 03/17/2018  PHQ - 2 Score 0 0 0 0 0 0 0   PHQ- 9 Score - - - - - - -     Fall Risk Fall Risk  06/30/2019 01/03/2019 09/08/2018 07/05/2018 05/28/2018  Falls in the past year? 0 0 No No No  Comment - - - - -  Number falls in past yr: 0 - - - -  Injury with Fall? 0 - - - -  Comment - - - - -  Risk Factor Category  - - - - -  Risk for fall due to : - - - - -  Follow up - - - - -     Objective:  [name] seemed alert and oriented and she participated appropriately during our telephone visit.  Blood Pressure Weight BMI  BP Readings from Last 3 Encounters:  06/24/19 (!) 137/54  04/12/19 (!) 147/72  01/03/19 (!) 147/77   Wt Readings from Last 3 Encounters:  06/24/19 299 lb 13.2 oz (136 kg)  04/12/19 296 lb 6.4 oz (134.4 kg)  01/03/19 294 lb (133.4 kg)   BMI Readings from Last 1 Encounters:  06/24/19 49.89 kg/m    *Unable to obtain current vital signs, weight, and BMI due to telephone visit type  Hearing/Vision  . [place] did not seem to have difficulty with hearing/understanding during the telephone conversation . Reports that she has not had a formal eye exam by an eye care professional within the past year . Reports that she has not had a formal hearing evaluation within the past year *Unable to fully assess hearing and vision during telephone visit type  Cognitive Function: 6CIT Screen 06/30/2019  What Year? 0 points  What month? 3 points  What time? 0 points  Count back from 20 0 points  Months in reverse 4 points  Repeat phrase 0 points  Total Score 7   (Normal:0-7, Significant for Dysfunction: >8)  Normal Cognitive Function Screening: Yes   Immunization & Health Maintenance Record Immunization History  Administered Date(s) Administered  . Influenza, High Dose Seasonal PF 09/08/2018  . Influenza,inj,Quad PF,6+ Mos 08/23/2013, 09/15/2014, 09/11/2015, 09/02/2016, 08/26/2017  . Pneumococcal Conjugate-13 09/15/2014  . Pneumococcal Polysaccharide-23 11/12/2015  . Tdap 03/09/2012  . Zoster 09/03/2012     Health Maintenance  Topic Date Due  . Hepatitis C Screening  1946-05-02  . OPHTHALMOLOGY EXAM  07/22/2017  . FOOT EXAM  02/24/2019  . INFLUENZA VACCINE  07/02/2019  . HEMOGLOBIN A1C  12/24/2019  . DEXA SCAN  01/22/2020  . TETANUS/TDAP  03/09/2022  . PNA vac Low Risk Adult  Completed  . COLONOSCOPY  Discontinued       Assessment  This is a routine wellness examination for [employer].  Health Maintenance: Due or Overdue Health Maintenance Due  Topic Date Due  . Hepatitis C Screening  [birth year]  . OPHTHALMOLOGY EXAM  07/22/2017  . FOOT EXAM  02/24/2019    [name] does not need a referral for Community Assistance: Care Management:   no Social Work:    no Prescription Assistance:  no Nutrition/Diabetes Education:  no   Plan:  Personalized Goals Goals Addressed   None    Personalized Health Maintenance & Screening Recommendations  Diabetes screening Advanced directives: has NO advanced  directive  - add't info requested. Referral to SW: no  Lung Cancer Screening Recommended: no (Low Dose CT Chest recommended if Age 51-80 years, 30 pack-year currently smoking OR have quit w/in past 15 years) Hepatitis C Screening recommended: yes HIV Screening recommended: no  Advanced Directives: Written information was prepared per patient's request.  Referrals & Orders No orders of the defined types were placed in this encounter.   Follow-up Plan . Follow-up with Claretta Fraise, MD as planned   I have personally reviewed and noted the following in the patient's chart:   . Medical and social history . Use of alcohol, tobacco or illicit drugs  . Current medications and supplements . Functional ability and status . Nutritional status . Physical activity . Advanced directives . List of other physicians . Hospitalizations, surgeries, and ER visits in previous 12 months . Vitals . Screenings to include cognitive, depression, and falls . Referrals and  appointments  In addition, I have reviewed and discussed with [name] certain preventive protocols, quality metrics, and best practice recommendations. A written personalized care plan for preventive services as well as general preventive health recommendations is available and can be mailed to the patient at her request.      Wardell Heath, LPN  07/20/8137

## 2019-07-04 ENCOUNTER — Telehealth: Payer: Self-pay | Admitting: Family Medicine

## 2019-07-04 NOTE — Telephone Encounter (Signed)
Daughter calling stating patient is hallucinating. Pt was in hospital last week for low Sodium level.

## 2019-07-04 NOTE — Telephone Encounter (Signed)
Daughter states that since starting gabapentin or tramadol patient has been hallucinating. Has been taking both medications for 2 weeks, hallucinations started 1 week ago.  Seen in hospital last week for low sodium.  No urinary

## 2019-07-04 NOTE — Telephone Encounter (Signed)
, °

## 2019-07-08 DIAGNOSIS — J9610 Chronic respiratory failure, unspecified whether with hypoxia or hypercapnia: Secondary | ICD-10-CM | POA: Diagnosis not present

## 2019-07-08 DIAGNOSIS — Z8673 Personal history of transient ischemic attack (TIA), and cerebral infarction without residual deficits: Secondary | ICD-10-CM | POA: Diagnosis not present

## 2019-07-08 DIAGNOSIS — I5033 Acute on chronic diastolic (congestive) heart failure: Secondary | ICD-10-CM | POA: Diagnosis not present

## 2019-07-08 DIAGNOSIS — J449 Chronic obstructive pulmonary disease, unspecified: Secondary | ICD-10-CM | POA: Diagnosis not present

## 2019-07-08 DIAGNOSIS — E1122 Type 2 diabetes mellitus with diabetic chronic kidney disease: Secondary | ICD-10-CM | POA: Diagnosis not present

## 2019-07-08 DIAGNOSIS — N183 Chronic kidney disease, stage 3 (moderate): Secondary | ICD-10-CM | POA: Diagnosis not present

## 2019-07-08 DIAGNOSIS — I130 Hypertensive heart and chronic kidney disease with heart failure and stage 1 through stage 4 chronic kidney disease, or unspecified chronic kidney disease: Secondary | ICD-10-CM | POA: Diagnosis not present

## 2019-07-08 DIAGNOSIS — I2510 Atherosclerotic heart disease of native coronary artery without angina pectoris: Secondary | ICD-10-CM | POA: Diagnosis not present

## 2019-07-08 DIAGNOSIS — I272 Pulmonary hypertension, unspecified: Secondary | ICD-10-CM | POA: Diagnosis not present

## 2019-07-11 ENCOUNTER — Other Ambulatory Visit: Payer: Self-pay | Admitting: Family Medicine

## 2019-07-11 DIAGNOSIS — S41109A Unspecified open wound of unspecified upper arm, initial encounter: Secondary | ICD-10-CM

## 2019-07-12 ENCOUNTER — Other Ambulatory Visit: Payer: Self-pay

## 2019-07-12 ENCOUNTER — Ambulatory Visit (INDEPENDENT_AMBULATORY_CARE_PROVIDER_SITE_OTHER): Payer: Medicare HMO

## 2019-07-12 DIAGNOSIS — I2510 Atherosclerotic heart disease of native coronary artery without angina pectoris: Secondary | ICD-10-CM | POA: Diagnosis not present

## 2019-07-12 DIAGNOSIS — Z8673 Personal history of transient ischemic attack (TIA), and cerebral infarction without residual deficits: Secondary | ICD-10-CM | POA: Diagnosis not present

## 2019-07-12 DIAGNOSIS — J9610 Chronic respiratory failure, unspecified whether with hypoxia or hypercapnia: Secondary | ICD-10-CM

## 2019-07-12 DIAGNOSIS — I130 Hypertensive heart and chronic kidney disease with heart failure and stage 1 through stage 4 chronic kidney disease, or unspecified chronic kidney disease: Secondary | ICD-10-CM

## 2019-07-12 DIAGNOSIS — I272 Pulmonary hypertension, unspecified: Secondary | ICD-10-CM | POA: Diagnosis not present

## 2019-07-12 DIAGNOSIS — J449 Chronic obstructive pulmonary disease, unspecified: Secondary | ICD-10-CM | POA: Diagnosis not present

## 2019-07-12 DIAGNOSIS — I5033 Acute on chronic diastolic (congestive) heart failure: Secondary | ICD-10-CM

## 2019-07-12 DIAGNOSIS — N183 Chronic kidney disease, stage 3 (moderate): Secondary | ICD-10-CM | POA: Diagnosis not present

## 2019-07-12 DIAGNOSIS — E1122 Type 2 diabetes mellitus with diabetic chronic kidney disease: Secondary | ICD-10-CM | POA: Diagnosis not present

## 2019-07-12 DIAGNOSIS — D631 Anemia in chronic kidney disease: Secondary | ICD-10-CM

## 2019-07-12 DIAGNOSIS — G4733 Obstructive sleep apnea (adult) (pediatric): Secondary | ICD-10-CM

## 2019-07-12 DIAGNOSIS — I4891 Unspecified atrial fibrillation: Secondary | ICD-10-CM

## 2019-07-13 DIAGNOSIS — I272 Pulmonary hypertension, unspecified: Secondary | ICD-10-CM | POA: Diagnosis not present

## 2019-07-13 DIAGNOSIS — J449 Chronic obstructive pulmonary disease, unspecified: Secondary | ICD-10-CM | POA: Diagnosis not present

## 2019-07-13 DIAGNOSIS — N183 Chronic kidney disease, stage 3 (moderate): Secondary | ICD-10-CM | POA: Diagnosis not present

## 2019-07-13 DIAGNOSIS — I130 Hypertensive heart and chronic kidney disease with heart failure and stage 1 through stage 4 chronic kidney disease, or unspecified chronic kidney disease: Secondary | ICD-10-CM | POA: Diagnosis not present

## 2019-07-13 DIAGNOSIS — I5033 Acute on chronic diastolic (congestive) heart failure: Secondary | ICD-10-CM | POA: Diagnosis not present

## 2019-07-13 DIAGNOSIS — J9610 Chronic respiratory failure, unspecified whether with hypoxia or hypercapnia: Secondary | ICD-10-CM | POA: Diagnosis not present

## 2019-07-13 DIAGNOSIS — Z8673 Personal history of transient ischemic attack (TIA), and cerebral infarction without residual deficits: Secondary | ICD-10-CM | POA: Diagnosis not present

## 2019-07-13 DIAGNOSIS — I2510 Atherosclerotic heart disease of native coronary artery without angina pectoris: Secondary | ICD-10-CM | POA: Diagnosis not present

## 2019-07-13 DIAGNOSIS — E1122 Type 2 diabetes mellitus with diabetic chronic kidney disease: Secondary | ICD-10-CM | POA: Diagnosis not present

## 2019-07-14 DIAGNOSIS — H5200 Hypermetropia, unspecified eye: Secondary | ICD-10-CM | POA: Diagnosis not present

## 2019-07-14 DIAGNOSIS — E119 Type 2 diabetes mellitus without complications: Secondary | ICD-10-CM | POA: Diagnosis not present

## 2019-07-14 DIAGNOSIS — Z961 Presence of intraocular lens: Secondary | ICD-10-CM | POA: Diagnosis not present

## 2019-07-14 DIAGNOSIS — D3131 Benign neoplasm of right choroid: Secondary | ICD-10-CM | POA: Diagnosis not present

## 2019-07-14 DIAGNOSIS — I10 Essential (primary) hypertension: Secondary | ICD-10-CM | POA: Diagnosis not present

## 2019-07-14 DIAGNOSIS — E7800 Pure hypercholesterolemia, unspecified: Secondary | ICD-10-CM | POA: Diagnosis not present

## 2019-07-14 LAB — HM DIABETES EYE EXAM

## 2019-07-20 DIAGNOSIS — I130 Hypertensive heart and chronic kidney disease with heart failure and stage 1 through stage 4 chronic kidney disease, or unspecified chronic kidney disease: Secondary | ICD-10-CM | POA: Diagnosis not present

## 2019-07-20 DIAGNOSIS — I272 Pulmonary hypertension, unspecified: Secondary | ICD-10-CM | POA: Diagnosis not present

## 2019-07-20 DIAGNOSIS — J449 Chronic obstructive pulmonary disease, unspecified: Secondary | ICD-10-CM | POA: Diagnosis not present

## 2019-07-20 DIAGNOSIS — I5033 Acute on chronic diastolic (congestive) heart failure: Secondary | ICD-10-CM | POA: Diagnosis not present

## 2019-07-20 DIAGNOSIS — J9610 Chronic respiratory failure, unspecified whether with hypoxia or hypercapnia: Secondary | ICD-10-CM | POA: Diagnosis not present

## 2019-07-20 DIAGNOSIS — I2510 Atherosclerotic heart disease of native coronary artery without angina pectoris: Secondary | ICD-10-CM | POA: Diagnosis not present

## 2019-07-20 DIAGNOSIS — Z8673 Personal history of transient ischemic attack (TIA), and cerebral infarction without residual deficits: Secondary | ICD-10-CM | POA: Diagnosis not present

## 2019-07-20 DIAGNOSIS — N183 Chronic kidney disease, stage 3 (moderate): Secondary | ICD-10-CM | POA: Diagnosis not present

## 2019-07-20 DIAGNOSIS — E1122 Type 2 diabetes mellitus with diabetic chronic kidney disease: Secondary | ICD-10-CM | POA: Diagnosis not present

## 2019-07-21 ENCOUNTER — Telehealth: Payer: Self-pay | Admitting: *Deleted

## 2019-07-21 NOTE — Telephone Encounter (Signed)
VM from [place] PT w/ [facility] During visit PT noted 2 new medications - gabapentin & trazodone Pt has been having visual & hearing hallucinations

## 2019-07-21 NOTE — Telephone Encounter (Signed)
Please contact the patient [name] gabapentin. See if hallucinations  go away

## 2019-07-21 NOTE — Telephone Encounter (Signed)
[name] with [facility] aware to DC gabapentin

## 2019-07-25 DIAGNOSIS — J4551 Severe persistent asthma with (acute) exacerbation: Secondary | ICD-10-CM | POA: Diagnosis not present

## 2019-07-25 DIAGNOSIS — I272 Pulmonary hypertension, unspecified: Secondary | ICD-10-CM | POA: Diagnosis not present

## 2019-08-02 ENCOUNTER — Telehealth: Payer: Self-pay | Admitting: Family Medicine

## 2019-08-02 DIAGNOSIS — E1122 Type 2 diabetes mellitus with diabetic chronic kidney disease: Secondary | ICD-10-CM | POA: Diagnosis not present

## 2019-08-02 DIAGNOSIS — Z8673 Personal history of transient ischemic attack (TIA), and cerebral infarction without residual deficits: Secondary | ICD-10-CM | POA: Diagnosis not present

## 2019-08-02 DIAGNOSIS — I272 Pulmonary hypertension, unspecified: Secondary | ICD-10-CM | POA: Diagnosis not present

## 2019-08-02 DIAGNOSIS — J449 Chronic obstructive pulmonary disease, unspecified: Secondary | ICD-10-CM | POA: Diagnosis not present

## 2019-08-02 DIAGNOSIS — I2510 Atherosclerotic heart disease of native coronary artery without angina pectoris: Secondary | ICD-10-CM | POA: Diagnosis not present

## 2019-08-02 DIAGNOSIS — J9610 Chronic respiratory failure, unspecified whether with hypoxia or hypercapnia: Secondary | ICD-10-CM | POA: Diagnosis not present

## 2019-08-02 DIAGNOSIS — I5033 Acute on chronic diastolic (congestive) heart failure: Secondary | ICD-10-CM | POA: Diagnosis not present

## 2019-08-02 DIAGNOSIS — I130 Hypertensive heart and chronic kidney disease with heart failure and stage 1 through stage 4 chronic kidney disease, or unspecified chronic kidney disease: Secondary | ICD-10-CM | POA: Diagnosis not present

## 2019-08-02 DIAGNOSIS — N183 Chronic kidney disease, stage 3 (moderate): Secondary | ICD-10-CM | POA: Diagnosis not present

## 2019-08-02 NOTE — Telephone Encounter (Signed)
lmtcb

## 2019-08-10 NOTE — Telephone Encounter (Signed)
Multiple attempts made to contact patient.  This encounter will now be closed  

## 2019-08-17 DIAGNOSIS — I2510 Atherosclerotic heart disease of native coronary artery without angina pectoris: Secondary | ICD-10-CM | POA: Diagnosis not present

## 2019-08-17 DIAGNOSIS — I130 Hypertensive heart and chronic kidney disease with heart failure and stage 1 through stage 4 chronic kidney disease, or unspecified chronic kidney disease: Secondary | ICD-10-CM | POA: Diagnosis not present

## 2019-08-17 DIAGNOSIS — J9610 Chronic respiratory failure, unspecified whether with hypoxia or hypercapnia: Secondary | ICD-10-CM | POA: Diagnosis not present

## 2019-08-17 DIAGNOSIS — I272 Pulmonary hypertension, unspecified: Secondary | ICD-10-CM | POA: Diagnosis not present

## 2019-08-17 DIAGNOSIS — E1122 Type 2 diabetes mellitus with diabetic chronic kidney disease: Secondary | ICD-10-CM | POA: Diagnosis not present

## 2019-08-17 DIAGNOSIS — Z8673 Personal history of transient ischemic attack (TIA), and cerebral infarction without residual deficits: Secondary | ICD-10-CM | POA: Diagnosis not present

## 2019-08-17 DIAGNOSIS — I5033 Acute on chronic diastolic (congestive) heart failure: Secondary | ICD-10-CM | POA: Diagnosis not present

## 2019-08-17 DIAGNOSIS — N183 Chronic kidney disease, stage 3 (moderate): Secondary | ICD-10-CM | POA: Diagnosis not present

## 2019-08-17 DIAGNOSIS — J449 Chronic obstructive pulmonary disease, unspecified: Secondary | ICD-10-CM | POA: Diagnosis not present

## 2019-08-25 DIAGNOSIS — J4551 Severe persistent asthma with (acute) exacerbation: Secondary | ICD-10-CM | POA: Diagnosis not present

## 2019-08-25 DIAGNOSIS — I272 Pulmonary hypertension, unspecified: Secondary | ICD-10-CM | POA: Diagnosis not present

## 2019-09-15 DIAGNOSIS — M79676 Pain in unspecified toe(s): Secondary | ICD-10-CM | POA: Diagnosis not present

## 2019-09-15 DIAGNOSIS — B351 Tinea unguium: Secondary | ICD-10-CM | POA: Diagnosis not present

## 2019-09-15 DIAGNOSIS — L84 Corns and callosities: Secondary | ICD-10-CM | POA: Diagnosis not present

## 2019-09-15 DIAGNOSIS — E1151 Type 2 diabetes mellitus with diabetic peripheral angiopathy without gangrene: Secondary | ICD-10-CM | POA: Diagnosis not present

## 2019-09-19 ENCOUNTER — Ambulatory Visit: Payer: Medicare HMO | Admitting: Family Medicine

## 2019-09-24 DIAGNOSIS — J4551 Severe persistent asthma with (acute) exacerbation: Secondary | ICD-10-CM | POA: Diagnosis not present

## 2019-09-24 DIAGNOSIS — I272 Pulmonary hypertension, unspecified: Secondary | ICD-10-CM | POA: Diagnosis not present

## 2019-09-28 ENCOUNTER — Other Ambulatory Visit: Payer: Self-pay

## 2019-09-29 ENCOUNTER — Ambulatory Visit: Payer: Medicare HMO | Admitting: Family Medicine

## 2019-10-03 ENCOUNTER — Ambulatory Visit: Payer: Medicare HMO | Admitting: Family Medicine

## 2019-10-08 DIAGNOSIS — D696 Thrombocytopenia, unspecified: Secondary | ICD-10-CM | POA: Diagnosis not present

## 2019-10-08 DIAGNOSIS — I5033 Acute on chronic diastolic (congestive) heart failure: Secondary | ICD-10-CM | POA: Diagnosis not present

## 2019-10-08 DIAGNOSIS — N179 Acute kidney failure, unspecified: Secondary | ICD-10-CM | POA: Diagnosis not present

## 2019-10-08 DIAGNOSIS — J441 Chronic obstructive pulmonary disease with (acute) exacerbation: Secondary | ICD-10-CM | POA: Diagnosis not present

## 2019-10-08 DIAGNOSIS — I50810 Right heart failure, unspecified: Secondary | ICD-10-CM | POA: Diagnosis not present

## 2019-10-08 DIAGNOSIS — J9601 Acute respiratory failure with hypoxia: Secondary | ICD-10-CM | POA: Diagnosis not present

## 2019-10-08 DIAGNOSIS — E1122 Type 2 diabetes mellitus with diabetic chronic kidney disease: Secondary | ICD-10-CM | POA: Diagnosis not present

## 2019-10-08 DIAGNOSIS — E039 Hypothyroidism, unspecified: Secondary | ICD-10-CM | POA: Diagnosis not present

## 2019-10-08 DIAGNOSIS — I509 Heart failure, unspecified: Secondary | ICD-10-CM | POA: Diagnosis not present

## 2019-10-08 DIAGNOSIS — I5032 Chronic diastolic (congestive) heart failure: Secondary | ICD-10-CM | POA: Diagnosis not present

## 2019-10-08 DIAGNOSIS — I959 Hypotension, unspecified: Secondary | ICD-10-CM | POA: Diagnosis not present

## 2019-10-08 DIAGNOSIS — E875 Hyperkalemia: Secondary | ICD-10-CM | POA: Diagnosis not present

## 2019-10-08 DIAGNOSIS — I4891 Unspecified atrial fibrillation: Secondary | ICD-10-CM | POA: Diagnosis not present

## 2019-10-08 DIAGNOSIS — D61818 Other pancytopenia: Secondary | ICD-10-CM | POA: Diagnosis not present

## 2019-10-08 DIAGNOSIS — N189 Chronic kidney disease, unspecified: Secondary | ICD-10-CM | POA: Diagnosis not present

## 2019-10-08 DIAGNOSIS — Z6841 Body Mass Index (BMI) 40.0 and over, adult: Secondary | ICD-10-CM | POA: Diagnosis not present

## 2019-10-08 DIAGNOSIS — I4819 Other persistent atrial fibrillation: Secondary | ICD-10-CM | POA: Diagnosis not present

## 2019-10-08 DIAGNOSIS — J810 Acute pulmonary edema: Secondary | ICD-10-CM | POA: Diagnosis not present

## 2019-10-08 DIAGNOSIS — J9621 Acute and chronic respiratory failure with hypoxia: Secondary | ICD-10-CM | POA: Diagnosis not present

## 2019-10-08 DIAGNOSIS — R5381 Other malaise: Secondary | ICD-10-CM | POA: Diagnosis not present

## 2019-10-08 DIAGNOSIS — J9600 Acute respiratory failure, unspecified whether with hypoxia or hypercapnia: Secondary | ICD-10-CM | POA: Diagnosis not present

## 2019-10-08 DIAGNOSIS — I130 Hypertensive heart and chronic kidney disease with heart failure and stage 1 through stage 4 chronic kidney disease, or unspecified chronic kidney disease: Secondary | ICD-10-CM | POA: Diagnosis not present

## 2019-10-08 DIAGNOSIS — I272 Pulmonary hypertension, unspecified: Secondary | ICD-10-CM | POA: Diagnosis not present

## 2019-10-08 DIAGNOSIS — R0689 Other abnormalities of breathing: Secondary | ICD-10-CM | POA: Diagnosis not present

## 2019-10-08 DIAGNOSIS — R918 Other nonspecific abnormal finding of lung field: Secondary | ICD-10-CM | POA: Diagnosis not present

## 2019-10-11 DIAGNOSIS — I50810 Right heart failure, unspecified: Secondary | ICD-10-CM | POA: Diagnosis not present

## 2019-10-11 DIAGNOSIS — I272 Pulmonary hypertension, unspecified: Secondary | ICD-10-CM | POA: Diagnosis not present

## 2019-10-12 ENCOUNTER — Encounter: Payer: Self-pay | Admitting: Family Medicine

## 2019-10-12 ENCOUNTER — Ambulatory Visit (INDEPENDENT_AMBULATORY_CARE_PROVIDER_SITE_OTHER): Payer: Medicare HMO | Admitting: Family Medicine

## 2019-10-12 ENCOUNTER — Inpatient Hospital Stay (HOSPITAL_COMMUNITY)
Admission: AD | Admit: 2019-10-12 | Discharge: 2019-10-25 | DRG: 286 | Disposition: A | Discharge status: Home Health | Payer: Medicare HMO | Source: Other Acute Inpatient Hospital | Attending: Internal Medicine | Admitting: Internal Medicine

## 2019-10-12 DIAGNOSIS — N184 Chronic kidney disease, stage 4 (severe): Secondary | ICD-10-CM | POA: Diagnosis present

## 2019-10-12 DIAGNOSIS — D649 Anemia, unspecified: Secondary | ICD-10-CM | POA: Diagnosis not present

## 2019-10-12 DIAGNOSIS — I130 Hypertensive heart and chronic kidney disease with heart failure and stage 1 through stage 4 chronic kidney disease, or unspecified chronic kidney disease: Secondary | ICD-10-CM | POA: Diagnosis not present

## 2019-10-12 DIAGNOSIS — Z800 Family history of malignant neoplasm of digestive organs: Secondary | ICD-10-CM

## 2019-10-12 DIAGNOSIS — N179 Acute kidney failure, unspecified: Secondary | ICD-10-CM | POA: Diagnosis not present

## 2019-10-12 DIAGNOSIS — Z7901 Long term (current) use of anticoagulants: Secondary | ICD-10-CM

## 2019-10-12 DIAGNOSIS — I2510 Atherosclerotic heart disease of native coronary artery without angina pectoris: Secondary | ICD-10-CM | POA: Diagnosis present

## 2019-10-12 DIAGNOSIS — I5033 Acute on chronic diastolic (congestive) heart failure: Secondary | ICD-10-CM

## 2019-10-12 DIAGNOSIS — I4821 Permanent atrial fibrillation: Secondary | ICD-10-CM | POA: Diagnosis present

## 2019-10-12 DIAGNOSIS — R0602 Shortness of breath: Secondary | ICD-10-CM | POA: Diagnosis not present

## 2019-10-12 DIAGNOSIS — Z825 Family history of asthma and other chronic lower respiratory diseases: Secondary | ICD-10-CM

## 2019-10-12 DIAGNOSIS — Z20828 Contact with and (suspected) exposure to other viral communicable diseases: Secondary | ICD-10-CM | POA: Diagnosis present

## 2019-10-12 DIAGNOSIS — I2781 Cor pulmonale (chronic): Secondary | ICD-10-CM | POA: Diagnosis present

## 2019-10-12 DIAGNOSIS — Z8249 Family history of ischemic heart disease and other diseases of the circulatory system: Secondary | ICD-10-CM

## 2019-10-12 DIAGNOSIS — Z833 Family history of diabetes mellitus: Secondary | ICD-10-CM

## 2019-10-12 DIAGNOSIS — Z8673 Personal history of transient ischemic attack (TIA), and cerebral infarction without residual deficits: Secondary | ICD-10-CM

## 2019-10-12 DIAGNOSIS — Z79899 Other long term (current) drug therapy: Secondary | ICD-10-CM

## 2019-10-12 DIAGNOSIS — I2721 Secondary pulmonary arterial hypertension: Secondary | ICD-10-CM | POA: Diagnosis present

## 2019-10-12 DIAGNOSIS — I959 Hypotension, unspecified: Secondary | ICD-10-CM | POA: Diagnosis present

## 2019-10-12 DIAGNOSIS — I358 Other nonrheumatic aortic valve disorders: Secondary | ICD-10-CM | POA: Diagnosis present

## 2019-10-12 DIAGNOSIS — J9620 Acute and chronic respiratory failure, unspecified whether with hypoxia or hypercapnia: Secondary | ICD-10-CM | POA: Diagnosis not present

## 2019-10-12 DIAGNOSIS — I509 Heart failure, unspecified: Secondary | ICD-10-CM | POA: Diagnosis not present

## 2019-10-12 DIAGNOSIS — Z9981 Dependence on supplemental oxygen: Secondary | ICD-10-CM

## 2019-10-12 DIAGNOSIS — Z841 Family history of disorders of kidney and ureter: Secondary | ICD-10-CM

## 2019-10-12 DIAGNOSIS — Z79890 Hormone replacement therapy (postmenopausal): Secondary | ICD-10-CM | POA: Diagnosis not present

## 2019-10-12 DIAGNOSIS — Z981 Arthrodesis status: Secondary | ICD-10-CM

## 2019-10-12 DIAGNOSIS — Z803 Family history of malignant neoplasm of breast: Secondary | ICD-10-CM

## 2019-10-12 DIAGNOSIS — J811 Chronic pulmonary edema: Secondary | ICD-10-CM | POA: Diagnosis not present

## 2019-10-12 DIAGNOSIS — E1122 Type 2 diabetes mellitus with diabetic chronic kidney disease: Secondary | ICD-10-CM | POA: Diagnosis present

## 2019-10-12 DIAGNOSIS — J9611 Chronic respiratory failure with hypoxia: Secondary | ICD-10-CM | POA: Diagnosis present

## 2019-10-12 DIAGNOSIS — I272 Pulmonary hypertension, unspecified: Secondary | ICD-10-CM | POA: Diagnosis not present

## 2019-10-12 DIAGNOSIS — Z820 Family history of epilepsy and other diseases of the nervous system: Secondary | ICD-10-CM

## 2019-10-12 DIAGNOSIS — J441 Chronic obstructive pulmonary disease with (acute) exacerbation: Secondary | ICD-10-CM | POA: Diagnosis not present

## 2019-10-12 DIAGNOSIS — I361 Nonrheumatic tricuspid (valve) insufficiency: Secondary | ICD-10-CM | POA: Diagnosis not present

## 2019-10-12 DIAGNOSIS — Z6841 Body Mass Index (BMI) 40.0 and over, adult: Secondary | ICD-10-CM

## 2019-10-12 DIAGNOSIS — D631 Anemia in chronic kidney disease: Secondary | ICD-10-CM | POA: Diagnosis present

## 2019-10-12 DIAGNOSIS — Z452 Encounter for adjustment and management of vascular access device: Secondary | ICD-10-CM | POA: Diagnosis not present

## 2019-10-12 DIAGNOSIS — J4551 Severe persistent asthma with (acute) exacerbation: Secondary | ICD-10-CM | POA: Diagnosis not present

## 2019-10-12 DIAGNOSIS — Z7983 Long term (current) use of bisphosphonates: Secondary | ICD-10-CM

## 2019-10-12 DIAGNOSIS — I2609 Other pulmonary embolism with acute cor pulmonale: Secondary | ICD-10-CM | POA: Diagnosis not present

## 2019-10-12 DIAGNOSIS — J90 Pleural effusion, not elsewhere classified: Secondary | ICD-10-CM | POA: Diagnosis not present

## 2019-10-12 DIAGNOSIS — D696 Thrombocytopenia, unspecified: Secondary | ICD-10-CM | POA: Diagnosis present

## 2019-10-12 DIAGNOSIS — N189 Chronic kidney disease, unspecified: Secondary | ICD-10-CM | POA: Diagnosis not present

## 2019-10-12 DIAGNOSIS — Z794 Long term (current) use of insulin: Secondary | ICD-10-CM

## 2019-10-12 HISTORY — DX: Acute on chronic diastolic (congestive) heart failure: I50.33

## 2019-10-12 MED ORDER — INSULIN ASPART 100 UNIT/ML ~~LOC~~ SOLN: 0.0000 [IU] | Freq: Three times a day (TID) | SUBCUTANEOUS | Status: DC

## 2019-10-12 MED ORDER — SODIUM CHLORIDE 0.9% FLUSH: 3.0000 mL | INTRAVENOUS | Status: DC | PRN

## 2019-10-12 MED ORDER — ASPIRIN EC 81 MG PO TBEC: 81.0000 mg | DELAYED_RELEASE_TABLET | Freq: Every day | ORAL | Status: DC

## 2019-10-12 MED ORDER — NOREPINEPHRINE 4 MG/250ML-% IV SOLN
2.0000 ug/min | INTRAVENOUS | Status: DC
  Administered 2019-10-13: 2 ug/min via INTRAVENOUS
  Filled 2019-10-12: qty 250

## 2019-10-12 MED ORDER — ALBUTEROL SULFATE (2.5 MG/3ML) 0.083% IN NEBU: 3.0000 mL | INHALATION_SOLUTION | Freq: Four times a day (QID) | RESPIRATORY_TRACT | Status: DC | PRN

## 2019-10-12 MED ORDER — IPRATROPIUM-ALBUTEROL 0.5-2.5 (3) MG/3ML IN SOLN: 3.0000 mL | Freq: Four times a day (QID) | RESPIRATORY_TRACT | Status: DC | PRN

## 2019-10-12 MED ORDER — GABAPENTIN 300 MG PO CAPS
300.0000 mg | ORAL_CAPSULE | Freq: Every day | ORAL | Status: DC
  Administered 2019-10-13: 300 mg via ORAL
  Filled 2019-10-12: qty 1

## 2019-10-12 MED ORDER — ASPIRIN 81 MG PO CHEW
81.0000 mg | CHEWABLE_TABLET | ORAL | Status: AC
  Administered 2019-10-13: 81 mg via ORAL
  Filled 2019-10-12: qty 1

## 2019-10-12 MED ORDER — SODIUM CHLORIDE 0.9 % IV SOLN: 250.0000 mL | INTRAVENOUS | Status: DC | PRN

## 2019-10-12 MED ORDER — HEPARIN (PORCINE) 25000 UT/250ML-% IV SOLN
1600.0000 [IU]/h | INTRAVENOUS | Status: DC
  Administered 2019-10-13: 1600 [IU]/h via INTRAVENOUS
  Filled 2019-10-12: qty 250

## 2019-10-12 MED ORDER — ONDANSETRON HCL 4 MG/2ML IJ SOLN: 4.0000 mg | Freq: Four times a day (QID) | INTRAMUSCULAR | Status: DC | PRN

## 2019-10-12 MED ORDER — PANTOPRAZOLE SODIUM 40 MG PO TBEC: 40.0000 mg | DELAYED_RELEASE_TABLET | Freq: Every day | ORAL | Status: DC

## 2019-10-12 MED ORDER — MIDODRINE HCL 5 MG PO TABS
5.0000 mg | ORAL_TABLET | Freq: Three times a day (TID) | ORAL | Status: DC
  Administered 2019-10-13 – 2019-10-14 (×4): 5 mg via ORAL
  Filled 2019-10-12 (×4): qty 1

## 2019-10-12 MED ORDER — SODIUM CHLORIDE 0.9% FLUSH
3.0000 mL | Freq: Two times a day (BID) | INTRAVENOUS | Status: DC
  Administered 2019-10-13: 3 mL via INTRAVENOUS

## 2019-10-12 MED ORDER — ACETAMINOPHEN 325 MG PO TABS
650.0000 mg | ORAL_TABLET | ORAL | Status: DC | PRN
  Administered 2019-10-13: 650 mg via ORAL
  Filled 2019-10-12: qty 2

## 2019-10-12 MED ORDER — SODIUM CHLORIDE 0.9 % IV SOLN
INTRAVENOUS | Status: DC
  Administered 2019-10-13: 06:00:00 via INTRAVENOUS

## 2019-10-12 MED ORDER — LEVOTHYROXINE SODIUM 50 MCG PO TABS
50.0000 ug | ORAL_TABLET | Freq: Every day | ORAL | Status: DC
  Administered 2019-10-13: 06:00:00 50 ug via ORAL
  Filled 2019-10-12: qty 1

## 2019-10-12 NOTE — H&P (Signed)
Advanced Heart Failure Team History and Physical Note   PCP:  Claretta Fraise, MD  PCP-Cardiology: Minus Breeding, MD     Reason for Admission: HF, Weakness   HPI:    [name] is a 73 y.o.femalewith morbid obesity, chronic diastolic HF, CKD 3, DM2, pulmonary hypertension permanent atrial fibrillation, chronic hypoxic respiratory failure on home O2 who is transferred from [facility] for further management of a/c diastolic HF, PAH and AKI.   She was admitted to [facility] in 7/20 with facial droop and confusion. W/u essentailly negative for CVA. Also treated for HF with IV lasix. Creatinine on d/c was 1.6. Weight was 127kg  Says she has had increasing SOB and LE edema for several weeks. Was on torsemide 20 bid and was not working. Records from [facility] are poor but apparently admitted on 11/7 with increasing SOB, LE edema and confusion. Was oh high-dose gabapentin and neurontin. Admitted for treatment for HF and COPD flare.   Echo from July showed EF 60-65% with PA pressures in the 80s. SHe apparenlty had echo 11/92/0 but results not provided. Admission creatinine not listed in records but apparently developed hypotension with attempts at diuresis and creatinine up to 3.2. Lasix stopped and given IVF but BP remained low. NE started at 5 and transferred here.   On arrival getting NE at 5 and NS at 75/hr. SBP 130. Says she feels ok but SOB with any exertion. Denies fevers or chills. No CP + edema.   Says she cannot get around much at home. Reports taking only 2u of insulin daily. Denies h/o OSA. Says she had a heart attack several years ago but never had a cath.    Review of Systems: [y] = yes, [ ]  = no   General: Weight gain Blue.Reese ]; Weight loss [ ] ; Anorexia [ ] ; Fatigue Blue.Reese ]; Fever [ ] ; Chills [ ] ; Weakness [ y]  Cardiac: Chest pain/pressure [ ] ; Resting SOB Blue.Reese ]; Exertional SOB Blue.Reese ]; Orthopnea [ ] ; Pedal Edema Blue.Reese ]; Palpitations [ ] ; Syncope [ ] ; Presyncope [ ] ; Paroxysmal  nocturnal dyspnea[y ]  Pulmonary: Cough Blue.Reese ]; Wheezing[ ] ; Hemoptysis[ ] ; Sputum [ ] ; Snoring [ ]   GI: Vomiting[ ] ; Dysphagia[ ] ; Melena[ ] ; Hematochezia [ ] ; Heartburn[ ] ; Abdominal pain [ y]; Constipation [ y]; Diarrhea [ ] ; BRBPR [ ]   GU: Hematuria[ ] ; Dysuria [ ] ; Nocturia[ ]   Vascular: Pain in legs with walking [ ] ; Pain in feet with lying flat [ ] ; Non-healing sores [ ] ; Stroke [ ] ; TIA [ y]; Slurred speech [ ] ;  Neuro: Headaches[ y]; Vertigo[ ] ; Seizures[ ] ; Paresthesias[ ] ;Blurred vision [ ] ; Diplopia [ ] ; Vision changes [ ]   Ortho/Skin: Arthritis Blue.Reese ]; Joint pain Blue.Reese ]; Muscle pain [ ] ; Joint swelling [ ] ; Back Pain [ y]; Rash [ ]   Psych: Depression[ ] ; Anxiety[ ]   Heme: Bleeding problems [ ] ; Clotting disorders [ ] ; Anemia Blue.Reese ]  Endocrine: Diabetes [ y]; Thyroid dysfunction[ y]   Home Medications Prior to Admission medications   Medication Sig Start Date End Date Taking? Authorizing Provider  acetaminophen (TYLENOL) 500 MG tablet Take 500 mg by mouth every 4 (four) hours as needed.     [provider]  albuterol (PROVENTIL HFA;VENTOLIN HFA) 108 (90 Base) MCG/ACT inhaler Inhale 2 puffs into the lungs every 6 (six) hours as needed for wheezing or shortness of breath. 12/20/18   Claretta Fraise, MD  alendronate (FOSAMAX) 70 MG tablet Take 1 tablet (  70 mg total) by mouth every 7 (seven) days. Patient taking differently: Take 70 mg by mouth every Friday.  06/15/19   Claretta Fraise, MD  apixaban (ELIQUIS) 2.5 MG TABS tablet Take 1 tablet (2.5 mg total) by mouth 2 (two) times daily. 06/15/19   Claretta Fraise, MD  cephALEXin (KEFLEX) 250 MG capsule TAKE 1 CAPSULE (250 MG TOTAL) BY MOUTH FOUR (4) TIMES A DAY FOR 10 DAYS. 07/11/19   Claretta Fraise, MD  diltiazem (CARDIZEM CD) 180 MG 24 hr capsule TAKE 1 CAPSULE (180 MG TOTAL) BY MOUTH DAILY. Patient taking differently: Take 180 mg by mouth every morning.  06/15/19   Claretta Fraise, MD  Dulaglutide (TRULICITY) 0.34 VQ/2.5ZD SOPN INJECT 0.75  MG INTO THE SKIN ONCE A WEEK. Patient taking differently: Inject 0.75 mg into the skin every Monday.  06/15/19   Claretta Fraise, MD  ferrous sulfate 325 (65 FE) MG tablet TAKE 1 TABLET BY MOUTH EVERY DAY WITH BREAKFAST 07/11/19   Claretta Fraise, MD  fish oil-omega-3 fatty acids 1000 MG capsule Take 2 g by mouth daily.      [provider]  fluocinonide-emollient (LIDEX-E) 0.05 % cream Apply 1 application topically 2 (two) times daily. To affected areas Patient taking differently: Apply 1 application topically 2 (two) times daily. To affected areas ON FEET 03/21/19   Claretta Fraise, MD  gabapentin (NEURONTIN) 300 MG capsule 1 at bedtime for1 week then 2  The next  week then 3 the next week then 4 daily. Patient taking differently: Take 300 mg by mouth 4 (four) times daily.  06/15/19   Claretta Fraise, MD  Insulin Detemir (LEVEMIR FLEXTOUCH) 100 UNIT/ML Pen Inject 40 Units into the skin 2 (two) times daily. 06/24/19   Barton Dubois, MD  ipratropium-albuterol (DUONEB) 0.5-2.5 (3) MG/3ML SOLN Inhale 3 mLs into the lungs every 6 (six) hours as needed (FOR SHORTNESS OF BREATH).  01/14/18   [provider]  levothyroxine (SYNTHROID) 50 MCG tablet Take 1 tablet (50 mcg total) by mouth daily. 06/15/19   Claretta Fraise, MD  losartan (COZAAR) 50 MG tablet Take 1 tablet (50 mg total) by mouth daily. Hold medication until follow up with PCP 06/24/19   Barton Dubois, MD  OXYGEN Inhale 2 L into the lungs continuous.    [provider]  torsemide (DEMADEX) 20 MG tablet Take 3 tablets (60 mg total) by mouth daily. 06/24/19   Barton Dubois, MD  traZODone (DESYREL) 300 MG tablet Take 1 tablet (300 mg total) by mouth at bedtime. 06/15/19   Claretta Fraise, MD    Past Medical History: Past Medical History:  Diagnosis Date  . Anemia   . Arthritis   . Asthma   . Atrial fibrillation ([place])   . CAD (coronary artery disease)    LAD 60% proximal stenosis mid and distal 60% stenosis 2009.  . Cataract     had surgery  . CHF (congestive heart failure) ([place])   . COPD (chronic obstructive pulmonary disease) ([place])   . Diabetes mellitus   . Gait instability    uses cane  . GERD (gastroesophageal reflux disease)   . Hyperlipidemia   . Hypertension   . Obesity   . Oxygen dependent    2 liter per nasal cannula  . Sleep apnea, obstructive 2008  . Stroke [facility])    2 mini    Past Surgical History: Past Surgical History:  Procedure Laterality Date  . APPENDECTOMY    . CARPAL TUNNEL RELEASE  right hand  . CATARACT EXTRACTION W/ INTRAOCULAR LENS  IMPLANT, BILATERAL    . CERVICAL SPINE SURGERY     fusion  . CESAREAN SECTION    . CHOLECYSTECTOMY    . COLONOSCOPY  11/22/2002   normal - Dr.Rrourk  . COLONOSCOPY  11/26/2005   incomplete with negative BE (Dr. Rowe Pavy, [place])  . EYE SURGERY    . FEMUR IM NAIL Left 05/08/2013   Procedure: INTRAMEDULLARY (IM) NAIL FEMORAL--LONG(BIOMET SYSTEM) and Zimmer Cables;  Surgeon: Augustin Schooling, MD;  Location: [place];  Service: Orthopedics;  Laterality: Left;  . HIP SURGERY Left   . KNEE ARTHROSCOPY     twice, left  . UMBILICAL HERNIA REPAIR    . UPPER GASTROINTESTINAL ENDOSCOPY  11/22/2002   Normal - Dr. Gala Romney    Family History:  Family History  Problem Relation Age of Onset  . Colon cancer Mother   . Heart attack Mother   . Alzheimer's disease Mother        father  . Diabetes Mother   . Heart disease Mother   . Cancer Mother        colon  . Kidney disease Mother   . Cancer Sister        brain and spine  . Migraines Sister   . Alzheimer's disease Father   . Cancer Brother        stomach  . Diabetes Sister   . Cancer Sister        breast  . Heart disease Sister   . Heart attack Sister   . Breast cancer Sister   . Heart disease Brother   . Heart murmur Brother   . Diabetes Other   . Heart attack Other   . Diabetes Daughter   . Asthma Daughter   . Asthma Grandchild     Social History: Social History   Socioeconomic History   . Marital status: Widowed    Spouse name: Not on file  . Number of children: 1  . Years of education: 43th  . Highest education level: 7th grade  Occupational History  . Occupation: Disabled  . Occupation: Retired    Comment: [employer] [place]  . Financial resource strain: Not very hard  . Food insecurity    Worry: Never true    Inability: Never true  . Transportation needs    Medical: No    Non-medical: No  Tobacco Use  . Smoking status: Never Smoker  . Smokeless tobacco: Never Used  Substance and Sexual Activity  . Alcohol use: No  . Drug use: No  . Sexual activity: Not Currently  Lifestyle  . Physical activity    Days per week: 0 days    Minutes per session: 0 min  . Stress: Not at all  Relationships  . Social connections    Talks on phone: More than three times a week    Gets together: More than three times a week    Attends religious service: 1 to 4 times per year    Active member of club or organization: Yes    Attends meetings of clubs or organizations: More than 4 times per year    Relationship status: Widowed  Other Topics Concern  . Not on file  Social History Narrative  . Not on file    Allergies:  Allergies  Allergen Reactions  . Lisinopril     R/t to kidney  . Codeine Nausea And Vomiting    Objective:  Vital Signs:   Temp:  [97.9 F (36.6 C)] 97.9 F (36.6 C) (11/11 2200) Pulse Rate:  [67-95] 67 (11/11 2300) Resp:  [15-26] 22 (11/11 2300) BP: (80-137)/(48-69) 99/48 (11/11 2300) SpO2:  [92 %-95 %] 95 % (11/11 2300) Weight:  [151.4 kg] 151.4 kg (11/11 2200)   Filed Weights   10/12/19 2200  Weight: (!) 151.4 kg     Physical Exam     General:  Chronically ill-appearing obese woman in bed. No respiratory difficulty HEENT: Normal x poor dentition Neck: Supple. JVP to jaw  Carotids 2+ bilat; no bruits. No lymphadenopathy or thyromegaly appreciated. Cor: PMI nondisplaced. Irregular rate & rhythm. 2/6 TR Lungs:  Clear Abdomen: Markedly obese Soft, nontender, nondistended. No hepatosplenomegaly. No bruits or masses. Good bowel sounds. Extremities: No cyanosis, clubbing, rash, 3+ edema Neuro: Alert & oriented x 3, cranial nerves grossly intact. moves all 4 extremities w/o difficulty. Affect pleasant.   Telemetry   AF 38s Personally reviewed  EKG   Pending  Labs     Basic Metabolic Panel: No results for input(s): NA, K, CL, CO2, GLUCOSE, BUN, CREATININE, CALCIUM, MG, PHOS in the last 168 hours.  Liver Function Tests: No results for input(s): AST, ALT, ALKPHOS, BILITOT, PROT, ALBUMIN in the last 168 hours. No results for input(s): LIPASE, AMYLASE in the last 168 hours. No results for input(s): AMMONIA in the last 168 hours.  CBC: No results for input(s): WBC, NEUTROABS, HGB, HCT, MCV, PLT in the last 168 hours.  Cardiac Enzymes: No results for input(s): CKTOTAL, CKMB, CKMBINDEX, TROPONINI in the last 168 hours.  BNP: BNP (last 3 results) Recent Labs    12/20/18 1700 06/22/19 1452  BNP 212.5* 187.0*    ProBNP (last 3 results) No results for input(s): PROBNP in the last 8760 hours.   CBG: No results for input(s): GLUCAP in the last 168 hours.  Coagulation Studies: No results for input(s): LABPROT, INR in the last 72 hours.  Imaging:  No results found.   Assessment/Plan   1. Acute on chronic diastolic heart failure with R>L HF - Echo 7/20 EF 60-65% RV reportedly normal with RVSP 85 - d/c weight in 7/20 was 127 kg. Now 151.4 kg - diuresis limited by hypotension and A/CKD - suspect significant component of cardiorenal syndrome - I have turned off IVF and will hold lasix  - plan Swan in am and adjust therapy from there. Suspect she may need CVVHD - repeat echo  2. 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 Hypotension - Use NE to keep SBP > 90 - Start  midodrine   4. Permanent AF - On eliquis and cardizem at home. - Heparin while here. Hold cardizem with low BP  5. Chronic hypoxic respiratory failure on home O2 - non-smoker. Suspect OSA /OHS/PAH - ABG at OSH  7.30/49/83/97%   6 Severe obesity (BMI >= 40)  -Body mass index is 55.54 kg/m.  7. Type 2 diabetes mellitus with nephropathy - Reports taking trulicity and 2u insulin daily at home - A1c 7.9 - Low-carb diet. Cover with SSI     Glori Bickers, MD 10/12/2019, 11:33 PM  Advanced Heart Failure Team Pager [phone] (M-F; 7a - 4p)  Please contact [facility] for night-coverage after hours (4p -7a ) and weekends on [email or web address]

## 2019-10-12 NOTE — Progress Notes (Signed)
ANTICOAGULATION CONSULT NOTE - Follow Up Consult  Pharmacy Consult for Heparin Indication: atrial fibrillation  Allergies  Allergen Reactions  . Lisinopril     R/t to kidney  . Codeine Nausea And Vomiting    Patient Measurements: Weight: (!) 333 lb 12.4 oz (151.4 kg)  Vital Signs: Temp: 97.9 F (36.6 C) (11/11 2200) Temp Source: Oral (11/11 2200) BP: 99/48 (11/11 2300) Pulse Rate: 67 (11/11 2300)  Labs: No results for input(s): HGB, HCT, PLT, APTT, LABPROT, INR, HEPARINUNFRC, HEPRLOWMOCWT, CREATININE, CKTOTAL, CKMB, TROPONINIHS in the last 72 hours.  CrCl cannot be calculated (Patient's most recent lab result is older than the maximum 21 days allowed.).   Assessment: 73 yo female transferred to [facility] on a heparin drip for atrial fibrillation.  Heparin drip running at 1120 units/hr.  Given patient's weight this is a low dose.  Additionally patient was on apixaban PTA, last dose unknown. Will monitor aPTT and HL initially to make sure that apixaban is no longer in her system.   Goal of Therapy:  Heparin level 0.3-0.7 units/ml aPTT 66 - 102 seconds Monitor platelets by anticoagulation protocol: Yes   Plan:  Increase heparin drip to 1600 units/hr Draw aPTT and HL in 6- 8 hours and monitor daily Monitor for signs and symptoms of bleeding  Alanda Slim, PharmD, [facility] Clinical Pharmacist Please see AMION for all Pharmacists' Contact Phone Numbers 10/12/2019, 11:37 PM

## 2019-10-12 NOTE — Progress Notes (Signed)
No answer X 3, no answering machine/voice mail. WS

## 2019-10-13 ENCOUNTER — Inpatient Hospital Stay (HOSPITAL_COMMUNITY): Payer: Medicare HMO

## 2019-10-13 ENCOUNTER — Other Ambulatory Visit: Payer: Self-pay

## 2019-10-13 ENCOUNTER — Encounter (HOSPITAL_COMMUNITY): Payer: Self-pay

## 2019-10-13 ENCOUNTER — Encounter (HOSPITAL_COMMUNITY)
Admission: AD | Disposition: A | Discharge status: Home Health | Payer: Self-pay | Source: Other Acute Inpatient Hospital | Attending: Internal Medicine

## 2019-10-13 DIAGNOSIS — J9611 Chronic respiratory failure with hypoxia: Secondary | ICD-10-CM

## 2019-10-13 DIAGNOSIS — I361 Nonrheumatic tricuspid (valve) insufficiency: Secondary | ICD-10-CM

## 2019-10-13 HISTORY — PX: RIGHT HEART CATH: CATH118263

## 2019-10-13 LAB — POCT I-STAT EG7
Acid-base deficit: 5 mmol/L — ABNORMAL HIGH (ref 0.0–2.0)
Acid-base deficit: 5 mmol/L — ABNORMAL HIGH (ref 0.0–2.0)
Bicarbonate: 23.6 mmol/L (ref 20.0–28.0)
Bicarbonate: 22.7 mmol/L (ref 20.0–28.0)
Calcium, Ion: 1.24 mmol/L (ref 1.15–1.40)
Calcium, Ion: 1.26 mmol/L (ref 1.15–1.40)
HCT: 30 % — ABNORMAL LOW (ref 36.0–46.0)
HCT: 30 % — ABNORMAL LOW (ref 36.0–46.0)
Hemoglobin: 10.2 g/dL — ABNORMAL LOW (ref 12.0–15.0)
Hemoglobin: 10.2 g/dL — ABNORMAL LOW (ref 12.0–15.0)
O2 Saturation: 65 %
O2 Saturation: 67 %
Potassium: 3.6 mmol/L (ref 3.5–5.1)
Potassium: 3.6 mmol/L (ref 3.5–5.1)
Sodium: 137 mmol/L (ref 135–145)
Sodium: 138 mmol/L (ref 135–145)
TCO2: 25 mmol/L (ref 22–32)
TCO2: 24 mmol/L (ref 22–32)
pCO2, Ven: 58.2 mmHg (ref 44.0–60.0)
pCO2, Ven: 56.2 mmHg (ref 44.0–60.0)
pH, Ven: 7.216 — ABNORMAL LOW (ref 7.250–7.430)
pH, Ven: 7.214 — ABNORMAL LOW (ref 7.250–7.430)
pO2, Ven: 41 mmHg (ref 32.0–45.0)
pO2, Ven: 43 mmHg (ref 32.0–45.0)

## 2019-10-13 LAB — URINALYSIS, ROUTINE W REFLEX MICROSCOPIC
Bilirubin Urine: NEGATIVE
Glucose, UA: NEGATIVE mg/dL
Ketones, ur: NEGATIVE mg/dL
Nitrite: NEGATIVE
Protein, ur: 30 mg/dL — AB
RBC / HPF: >50 RBC/hpf — ABNORMAL HIGH (ref 0–5)
Specific Gravity, Urine: 1.014 (ref 1.005–1.030)
WBC, UA: >50 WBC/hpf — ABNORMAL HIGH (ref 0–5)
pH: 5 (ref 5.0–8.0)

## 2019-10-13 LAB — BASIC METABOLIC PANEL
Anion gap: 11 (ref 5–15)
Anion gap: 11 (ref 5–15)
BUN: 46 mg/dL — ABNORMAL HIGH (ref 8–23)
BUN: 46 mg/dL — ABNORMAL HIGH (ref 8–23)
CO2: 22 mmol/L (ref 22–32)
CO2: 22 mmol/L (ref 22–32)
Calcium: 8.1 mg/dL — ABNORMAL LOW (ref 8.9–10.3)
Calcium: 8.1 mg/dL — ABNORMAL LOW (ref 8.9–10.3)
Chloride: 104 mmol/L (ref 98–111)
Chloride: 103 mmol/L (ref 98–111)
Creatinine, Ser: 2.91 mg/dL — ABNORMAL HIGH (ref 0.44–1.00)
Creatinine, Ser: 2.58 mg/dL — ABNORMAL HIGH (ref 0.44–1.00)
GFR calc Af Amer: 18 mL/min — ABNORMAL LOW (ref >60)
GFR calc Af Amer: 20 mL/min — ABNORMAL LOW (ref >60)
GFR calc non Af Amer: 15 mL/min — ABNORMAL LOW (ref >60)
GFR calc non Af Amer: 18 mL/min — ABNORMAL LOW (ref >60)
Glucose, Bld: 179 mg/dL — ABNORMAL HIGH (ref 70–99)
Glucose, Bld: 171 mg/dL — ABNORMAL HIGH (ref 70–99)
Potassium: 3.6 mmol/L (ref 3.5–5.1)
Potassium: 3.5 mmol/L (ref 3.5–5.1)
Sodium: 137 mmol/L (ref 135–145)
Sodium: 136 mmol/L (ref 135–145)

## 2019-10-13 LAB — HEMOGLOBIN A1C
Hgb A1c MFr Bld: 7.8 % — ABNORMAL HIGH (ref 4.8–5.6)
Mean Plasma Glucose: 177.16 mg/dL

## 2019-10-13 LAB — APTT
aPTT: 158 seconds — ABNORMAL HIGH (ref 24–36)
aPTT: 146 seconds — ABNORMAL HIGH (ref 24–36)

## 2019-10-13 LAB — COMPREHENSIVE METABOLIC PANEL
ALT: 12 U/L (ref 0–44)
AST: 8 U/L — ABNORMAL LOW (ref 15–41)
Albumin: 2.5 g/dL — ABNORMAL LOW (ref 3.5–5.0)
Alkaline Phosphatase: 148 U/L — ABNORMAL HIGH (ref 38–126)
Anion gap: 11 (ref 5–15)
BUN: 47 mg/dL — ABNORMAL HIGH (ref 8–23)
CO2: 20 mmol/L — ABNORMAL LOW (ref 22–32)
Calcium: 8.1 mg/dL — ABNORMAL LOW (ref 8.9–10.3)
Chloride: 105 mmol/L (ref 98–111)
Creatinine, Ser: 3.02 mg/dL — ABNORMAL HIGH (ref 0.44–1.00)
GFR calc Af Amer: 17 mL/min — ABNORMAL LOW (ref >60)
GFR calc non Af Amer: 14 mL/min — ABNORMAL LOW (ref >60)
Glucose, Bld: 173 mg/dL — ABNORMAL HIGH (ref 70–99)
Potassium: 3.7 mmol/L (ref 3.5–5.1)
Sodium: 136 mmol/L (ref 135–145)
Total Bilirubin: 0.4 mg/dL (ref 0.3–1.2)
Total Protein: 5.5 g/dL — ABNORMAL LOW (ref 6.5–8.1)

## 2019-10-13 LAB — GLUCOSE, CAPILLARY: Glucose-Capillary: 164 mg/dL — ABNORMAL HIGH (ref 70–99)

## 2019-10-13 LAB — COOXEMETRY PANEL
Carboxyhemoglobin: 2 % — ABNORMAL HIGH (ref 0.5–1.5)
Carboxyhemoglobin: 1.6 % — ABNORMAL HIGH (ref 0.5–1.5)
Methemoglobin: 1.2 % (ref 0.0–1.5)
Methemoglobin: 0.7 % (ref 0.0–1.5)
O2 Saturation: 68 %
O2 Saturation: 68.7 %
Total hemoglobin: 8.8 g/dL — ABNORMAL LOW (ref 12.0–16.0)
Total hemoglobin: 9.1 g/dL — ABNORMAL LOW (ref 12.0–16.0)

## 2019-10-13 LAB — ECHOCARDIOGRAM COMPLETE
Height: 65 in
Weight: 5340.42 oz

## 2019-10-13 LAB — CBC WITH DIFFERENTIAL/PLATELET
Abs Immature Granulocytes: 0.02 10*3/uL (ref 0.00–0.07)
Basophils Absolute: 0 10*3/uL (ref 0.0–0.1)
Basophils Relative: 1 %
Eosinophils Absolute: 0.1 10*3/uL (ref 0.0–0.5)
Eosinophils Relative: 2 %
HCT: 30.1 % — ABNORMAL LOW (ref 36.0–46.0)
Hemoglobin: 9.4 g/dL — ABNORMAL LOW (ref 12.0–15.0)
Immature Granulocytes: 0 %
Lymphocytes Relative: 13 %
Lymphs Abs: 0.9 10*3/uL (ref 0.7–4.0)
MCH: 30.8 pg (ref 26.0–34.0)
MCHC: 31.2 g/dL (ref 30.0–36.0)
MCV: 98.7 fL (ref 80.0–100.0)
Monocytes Absolute: 0.8 10*3/uL (ref 0.1–1.0)
Monocytes Relative: 12 %
Neutro Abs: 4.7 10*3/uL (ref 1.7–7.7)
Neutrophils Relative %: 72 %
Platelets: 73 10*3/uL — ABNORMAL LOW (ref 150–400)
RBC: 3.05 MIL/uL — ABNORMAL LOW (ref 3.87–5.11)
RDW: 15.3 % (ref 11.5–15.5)
WBC: 6.5 10*3/uL (ref 4.0–10.5)
nRBC: 0 % (ref 0.0–0.2)

## 2019-10-13 LAB — MAGNESIUM: Magnesium: 2 mg/dL (ref 1.7–2.4)

## 2019-10-13 LAB — CBC
HCT: 31.1 % — ABNORMAL LOW (ref 36.0–46.0)
Hemoglobin: 9.6 g/dL — ABNORMAL LOW (ref 12.0–15.0)
MCH: 30.9 pg (ref 26.0–34.0)
MCHC: 30.9 g/dL (ref 30.0–36.0)
MCV: 100 fL (ref 80.0–100.0)
Platelets: 75 10*3/uL — ABNORMAL LOW (ref 150–400)
RBC: 3.11 MIL/uL — ABNORMAL LOW (ref 3.87–5.11)
RDW: 15.3 % (ref 11.5–15.5)
WBC: 6.8 10*3/uL (ref 4.0–10.5)
nRBC: 0 % (ref 0.0–0.2)

## 2019-10-13 LAB — SURGICAL PCR SCREEN
MRSA, PCR: POSITIVE — AB
Staphylococcus aureus: POSITIVE — AB

## 2019-10-13 LAB — MRSA PCR SCREENING: MRSA by PCR: POSITIVE — AB

## 2019-10-13 LAB — BRAIN NATRIURETIC PEPTIDE: B Natriuretic Peptide: 571.9 pg/mL — ABNORMAL HIGH (ref 0.0–100.0)

## 2019-10-13 LAB — TSH: TSH: 4.217 u[IU]/mL (ref 0.350–4.500)

## 2019-10-13 LAB — HEPARIN LEVEL (UNFRACTIONATED): Heparin Unfractionated: 1.46 IU/mL — ABNORMAL HIGH (ref 0.30–0.70)

## 2019-10-13 SURGERY — RIGHT HEART CATH
Anesthesia: LOCAL

## 2019-10-13 MED ORDER — CHLORHEXIDINE GLUCONATE CLOTH 2 % EX PADS
6.0000 | MEDICATED_PAD | Freq: Every day | CUTANEOUS | Status: DC
  Administered 2019-10-14 – 2019-10-24 (×10): 6 via TOPICAL

## 2019-10-13 MED ORDER — SODIUM CHLORIDE 0.9 % IV SOLN
INTRAVENOUS | Status: AC | PRN
  Administered 2019-10-13: 10 mL/h via INTRAVENOUS

## 2019-10-13 MED ORDER — FUROSEMIDE 10 MG/ML IJ SOLN
20.0000 mg/h | INTRAVENOUS | Status: DC
  Administered 2019-10-13 – 2019-10-14 (×2): 15 mg/h via INTRAVENOUS
  Administered 2019-10-14 – 2019-10-22 (×14): 20 mg/h via INTRAVENOUS
  Filled 2019-10-13: qty 25
  Filled 2019-10-13: qty 21
  Filled 2019-10-13 (×4): qty 25
  Filled 2019-10-13: qty 21
  Filled 2019-10-13 (×6): qty 25
  Filled 2019-10-13: qty 21
  Filled 2019-10-13 (×4): qty 25
  Filled 2019-10-13: qty 21

## 2019-10-13 MED ORDER — NOREPINEPHRINE 4 MG/250ML-% IV SOLN
0.0000 ug/min | INTRAVENOUS | Status: DC
  Administered 2019-10-13: 3 ug/min via INTRAVENOUS
  Administered 2019-10-15: 2 ug/min via INTRAVENOUS
  Filled 2019-10-13 (×2): qty 250

## 2019-10-13 MED ORDER — HEPARIN (PORCINE) IN NACL 1000-0.9 UT/500ML-% IV SOLN
INTRAVENOUS | Status: DC | PRN
  Administered 2019-10-13: 500 mL

## 2019-10-13 MED ORDER — ORAL CARE MOUTH RINSE
15.0000 mL | Freq: Two times a day (BID) | OROMUCOSAL | Status: DC
  Administered 2019-10-13 – 2019-10-24 (×20): 15 mL via OROMUCOSAL

## 2019-10-13 MED ORDER — HEPARIN (PORCINE) 25000 UT/250ML-% IV SOLN
1350.0000 [IU]/h | INTRAVENOUS | Status: DC
  Administered 2019-10-13 (×2): 1400 [IU]/h via INTRAVENOUS
  Administered 2019-10-14: 1050 [IU]/h via INTRAVENOUS
  Administered 2019-10-16 – 2019-10-19 (×5): 1350 [IU]/h via INTRAVENOUS
  Filled 2019-10-13 (×8): qty 250

## 2019-10-13 MED ORDER — LIDOCAINE HCL (PF) 1 % IJ SOLN
INTRAMUSCULAR | Status: AC
  Filled 2019-10-13: qty 30

## 2019-10-13 MED ORDER — ACETAMINOPHEN 325 MG PO TABS
650.0000 mg | ORAL_TABLET | ORAL | Status: DC | PRN
  Administered 2019-10-21: 650 mg via ORAL
  Filled 2019-10-13: qty 2

## 2019-10-13 MED ORDER — HEPARIN (PORCINE) IN NACL 1000-0.9 UT/500ML-% IV SOLN
INTRAVENOUS | Status: AC
  Filled 2019-10-13: qty 500

## 2019-10-13 MED ORDER — HYDRALAZINE HCL 20 MG/ML IJ SOLN: 10.0000 mg | INTRAMUSCULAR | Status: AC | PRN

## 2019-10-13 MED ORDER — POTASSIUM CHLORIDE CRYS ER 20 MEQ PO TBCR
20.0000 meq | EXTENDED_RELEASE_TABLET | Freq: Once | ORAL | Status: AC
  Administered 2019-10-13: 20 meq via ORAL
  Filled 2019-10-13: qty 1

## 2019-10-13 MED ORDER — LIDOCAINE HCL (PF) 1 % IJ SOLN
INTRAMUSCULAR | Status: DC | PRN
  Administered 2019-10-13: 12 mL

## 2019-10-13 MED ORDER — LABETALOL HCL 5 MG/ML IV SOLN: 10.0000 mg | INTRAVENOUS | Status: AC | PRN

## 2019-10-13 MED ORDER — ONDANSETRON HCL 4 MG/2ML IJ SOLN: 4.0000 mg | Freq: Four times a day (QID) | INTRAMUSCULAR | Status: DC | PRN

## 2019-10-13 MED ORDER — MILRINONE LACTATE IN DEXTROSE 20-5 MG/100ML-% IV SOLN
0.1250 ug/kg/min | INTRAVENOUS | Status: DC
  Administered 2019-10-13 – 2019-10-21 (×13): 0.125 ug/kg/min via INTRAVENOUS
  Filled 2019-10-13 (×14): qty 100

## 2019-10-13 MED ORDER — MUPIROCIN 2 % EX OINT
1.0000 "application " | TOPICAL_OINTMENT | Freq: Two times a day (BID) | CUTANEOUS | Status: AC
  Administered 2019-10-13 – 2019-10-17 (×10): 1 via NASAL
  Filled 2019-10-13 (×2): qty 22

## 2019-10-13 MED ORDER — SODIUM CHLORIDE 0.9% FLUSH
3.0000 mL | Freq: Two times a day (BID) | INTRAVENOUS | Status: DC
  Administered 2019-10-13 – 2019-10-25 (×21): 3 mL via INTRAVENOUS

## 2019-10-13 MED ORDER — SODIUM CHLORIDE 0.9 % IV SOLN
250.0000 mL | INTRAVENOUS | Status: DC | PRN
  Administered 2019-10-16 – 2019-10-19 (×2): 250 mL via INTRAVENOUS

## 2019-10-13 MED ORDER — SODIUM CHLORIDE 0.9% FLUSH
3.0000 mL | INTRAVENOUS | Status: DC | PRN
  Administered 2019-10-21: 3 mL via INTRAVENOUS
  Filled 2019-10-13: qty 3

## 2019-10-13 SURGICAL SUPPLY — 12 items
CATH SWAN GANZ VIP 7.5F (CATHETERS) ×2 IMPLANT
HOVERMATT SINGLE USE (MISCELLANEOUS) ×2 IMPLANT
PACK CARDIAC CATHETERIZATION (CUSTOM PROCEDURE TRAY) ×2 IMPLANT
PROTECTION STATION PRESSURIZED (MISCELLANEOUS) ×2
SHEATH PINNACLE 8F 10CM (SHEATH) ×2 IMPLANT
SHEATH PROBE COVER 6X72 (BAG) ×2 IMPLANT
SLEEVE REPOSITIONING LENGTH 30 (MISCELLANEOUS) ×2 IMPLANT
STATION PROTECTION PRESSURIZED (MISCELLANEOUS) ×1 IMPLANT
TRANSDUCER W/MONITORING KIT (MISCELLANEOUS) ×4 IMPLANT
TRANSDUCER W/STOPCOCK (MISCELLANEOUS) ×2 IMPLANT
TUBING ART PRESS 72  MALE/FEM (TUBING) ×1
TUBING ART PRESS 72 MALE/FEM (TUBING) ×1 IMPLANT

## 2019-10-13 NOTE — Progress Notes (Signed)
Chaplain is aware of need for AD. Notary is out today. Chaplain will revisit this task on Friday November 13th 2020. Chaplain remains available as needs arise.   Chaplain Resident, Evelene Croon, M Div

## 2019-10-13 NOTE — Progress Notes (Signed)
Orthopedic Tech Progress Note Patient Details:  [name] [birth year] [id number] Applied with help from other ortho tech ANTHONY  Ortho Devices Type of Ortho Device: Haematologist Ortho Device/Splint Location: bilateral legs Ortho Device/Splint Interventions: Adjustment, Application, Ordered   Post Interventions Patient Tolerated: Well Instructions Provided: Care of device, Adjustment of device   [name] 10/13/2019, 1:18 PM

## 2019-10-13 NOTE — Progress Notes (Signed)
Noted an OT order placed on 11/11 and a discontinue order on 11/12.  Will not evaluate at this time due to discontinue order.  Please reorder when needed.   Jinger Neighbors, [place] [phone]

## 2019-10-13 NOTE — Progress Notes (Signed)
Advanced Heart Failure Rounding Note   Subjective:    Remains weak. Mildly SOB. Unable to lie flat. SBP 90-110. No CP  Creatinine slightly improved to 2.9. On heparin. No bleeding.   RHC this am  RA = 19 RV = 84/19 PA = 92/28 (48) PCW = 22 Fick cardiac output/index = 7.7/3.1 Thermo CO/CI = 4.6/1.9 PVR = 5.5 WU (thermo) Ao sat = 99% PA sat = 65%, 67%  Assessment:  1. 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 Mildly elevated PCWP 3. Moderate to severely reduced CO by Thermo    Objective:   Weight Range:  Vital Signs:   Temp:  [97.9 F (36.6 C)-98.5 F (36.9 C)] 98.5 F (36.9 C) (11/12 0400) Pulse Rate:  [61-126] 82 (11/12 0823) Resp:  [13-31] 17 (11/12 0823) BP: (80-141)/(46-88) 116/59 (11/12 0823) SpO2:  [86 %-99 %] 99 % (11/12 0823) Weight:  [151.4 kg] 151.4 kg (11/12 0500) Last BM Date: 10/11/19  Weight change: Filed Weights   10/12/19 2200 10/13/19 0500  Weight: (!) 151.4 kg (!) 151.4 kg    Intake/Output:   Intake/Output Summary (Last 24 hours) at 10/13/2019 0836 Last data filed at 10/13/2019 0700 Gross per 24 hour  Intake 202.9 ml  Output 300 ml  Net -97.1 ml     Physical Exam: General:  Elderly. Ill-appearing. No resp difficulty HEENT: normal Neck: supple. JVP to ear . Carotids 2+ bilat; no bruits. No lymphadenopathy or thryomegaly appreciated. Cor: PMI nondisplaced. Irregular rate & rhythm.2/6 TR Lungs: clear Abdomen: obese soft, nontender, nondistended. No hepatosplenomegaly. No bruits or masses. Good bowel sounds. Extremities: no cyanosis, clubbing, rash, 3-4+ edema Neuro: alert & orientedx3, cranial nerves grossly intact. moves all 4 extremities w/o difficulty. Affect pleasant  Telemetry: AF 60-67s Personally reviewed   Labs: Basic Metabolic Panel: Recent Labs  Lab 10/13/19 0018 10/13/19 0542  NA 136 137  K 3.7 3.6  CL 105 104  CO2 20* 22  GLUCOSE 173* 179*  BUN 47* 46*  CREATININE 3.02* 2.91*  CALCIUM 8.1* 8.1*    MG 2.0  --     Liver Function Tests: Recent Labs  Lab 10/13/19 0018  AST 8*  ALT 12  ALKPHOS 148*  BILITOT 0.4  PROT 5.5*  ALBUMIN 2.5*   No results for input(s): LIPASE, AMYLASE in the last 168 hours. No results for input(s): AMMONIA in the last 168 hours.  CBC: Recent Labs  Lab 10/13/19 0018 10/13/19 0542  WBC 6.5 6.8  NEUTROABS 4.7  --   HGB 9.4* 9.6*  HCT 30.1* 31.1*  MCV 98.7 100.0  PLT 73* 75*    Cardiac Enzymes: No results for input(s): CKTOTAL, CKMB, CKMBINDEX, TROPONINI in the last 168 hours.  BNP: BNP (last 3 results) Recent Labs    12/20/18 1700 06/22/19 1452 10/13/19 0018  BNP 212.5* 187.0* 571.9*    ProBNP (last 3 results) No results for input(s): PROBNP in the last 8760 hours.    Other results:  Imaging: US Renal  Result Date: 10/13/2019 CLINICAL DATA:  Acute kidney injury EXAM: RENAL / URINARY TRACT ULTRASOUND COMPLETE COMPARISON:  None. FINDINGS: Right Kidney: Renal measurements: 10.0 x 6.6 x 4.8 cm = volume: 165 mL. There is a small anechoic 2 cm lesion seen within the upper pole. There is diffuse cortical thinning. Left Kidney: Renal measurements: 11.0 x 6.7 x 5.3 cm = volume: 203 mL. There is diffuse cortical thinning. A 2.3 cm anechoic lesion seen within the upper pole. Bladder: Decompressed due  to Foley. Other: None. IMPRESSION: Bilateral renal cortical thinning and renal cysts. No hydronephrosis. Electronically Signed   By: Prudencio Pair M.D.   On: 10/13/2019 03:47   Dg Chest Port 1 View  Result Date: 10/13/2019 CLINICAL DATA:  Shortness of breath.  CHF. EXAM: PORTABLE CHEST 1 VIEW COMPARISON:  06/22/2019. FINDINGS: Cardiomegaly with bilateral infiltrates again noted. Findings most consistent pulmonary edema. Persistent bibasilar atelectasis. Bilateral pleural effusions again noted. No pneumothorax. Prior cervical spine fusion IMPRESSION: 1. Cardiomegaly with bilateral pulmonary infiltrates again noted. Findings most consistent with  pulmonary edema. Bilateral pleural effusions again noted. Findings consistent with CHF. Similar findings noted on prior exam. 2.  Persistent bibasilar atelectasis. Electronically Signed   By: Marcello Moores  Register   On: 10/13/2019 07:08      Medications:     Scheduled Medications:  [START ON 10/14/2019] aspirin EC  81 mg Oral Daily   [MAR Hold] Chlorhexidine Gluconate Cloth  6 each Topical Daily   gabapentin  300 mg Oral QHS   insulin aspart  0-15 Units Subcutaneous TID WC   levothyroxine  50 mcg Oral Q0600   [MAR Hold] mouth rinse  15 mL Mouth Rinse BID   [MAR Hold] midodrine  5 mg Oral TID WC   [MAR Hold] mupirocin ointment  1 application Nasal BID   pantoprazole  40 mg Oral Daily     Infusions:  sodium chloride 10 mL/hr at 10/13/19 0700   sodium chloride 10 mL/hr (10/13/19 0833)   sodium chloride 10 mL/hr (10/13/19 0834)   heparin Stopped (10/13/19 0750)   norepinephrine (LEVOPHED) Adult infusion 2 mcg/min (10/13/19 0700)     PRN Medications:  sodium chloride, sodium chloride, acetaminophen, albuterol, Heparin (Porcine) in NaCl, ipratropium-albuterol, ondansetron (ZOFRAN) IV   Assessment/Plan:   1. PAH with cor pulmonale, end-stage - RHC numbers as above with PAP 92/28 (48) and PCWP 21 - Likely multifactorial WHO Group, I,II,III - marked R-sided volume overload complicated by cardio renal syndrome - start milrinone and lasix gtt and follow  2. Acute on chronic diastolic heart failure with R>L HF - Echo 7/20 EF 60-65% RV reportedly normal with RVSP 85 - d/c weight in 7/20 was 127 kg. Now 151.4 kg - diuresis limited by hypotension and A/CKD - suspect significant component of cardiorenal syndrome - Plan as above.   3. 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 If creatinine rises  again with consult Renal to discuss role of CVVHD though probably not candidte for long-term HD  4. Hypotension - Improved with midodrine  5. Permanent AF - On eliquis and cardizem at home. - Heparin while here. Hold cardizem with low BP  6. Chronic hypoxic respiratory failure on home O2 - non-smoker. Suspect OSA /OHS/PAH - ABG at OSH  7.30/49/83/97% (only mild CO2 retention)   7 Severe obesity (BMI >= 40)  -Body mass index is 55.54 kg/m.  8. Type 2 diabetes mellitus with nephropathy - Reports taking trulicity and 2u insulin daily at home - A1c 7.9 - Low-carb diet. Cover with SSI   9. Anemia of Chronic disease - consider Aranesp  CRITICAL CARE Performed by: Glori Bickers  Total critical care time: 45 minutes  Critical care time was exclusive of separately billable procedures and treating other patients.  Critical care was necessary to treat or prevent  imminent or life-threatening deterioration.  Critical care was time spent personally by me (independent of midlevel providers or residents) on the following activities: development of treatment plan with patient and/or surrogate as well as nursing, discussions with consultants, evaluation of patient's response to treatment, examination of patient, obtaining history from patient or surrogate, ordering and performing treatments and interventions, ordering and review of laboratory studies, ordering and review of radiographic studies, pulse oximetry and re-evaluation of patient's condition.   Length of Stay: 1   Glori Bickers MD 10/13/2019, 8:36 AM  Advanced Heart Failure Team Pager [phone] (M-F; [place])  Please contact [facility] for night-coverage after hours (4p -7a ) and weekends on [email or web address]

## 2019-10-13 NOTE — Progress Notes (Signed)
ANTICOAGULATION CONSULT NOTE - Follow Up Consult  Pharmacy Consult for Heparin Indication: atrial fibrillation  Allergies  Allergen Reactions  . Lisinopril     R/t to kidney  . Codeine Nausea And Vomiting    Patient Measurements: Height: 5\' 5"  (165.1 cm) Weight: (!) 333 lb 12.4 oz (151.4 kg) IBW/kg (Calculated) : 57 Heparin dosing weight: 95.3 kg  Vital Signs: Temp: 98.5 F (36.9 C) (11/12 0400) Temp Source: Axillary (11/12 0400) BP: 116/59 (11/12 0823) Pulse Rate: 82 (11/12 0823)  Labs: Recent Labs    10/13/19 0018 10/13/19 0542  HGB 9.4* 9.6*  HCT 30.1* 31.1*  PLT 73* 75*  APTT  --  158*  HEPARINUNFRC  --  1.46*  CREATININE 3.02* 2.91*    Estimated Creatinine Clearance: 25 mL/min (A) (by C-G formula based on SCr of 2.91 mg/dL (H)).   Assessment: 73 yo female transferred to [facility] on a heparin drip for atrial fibrillation.  Patient was on apixaban PTA, last dose unknown. Heparin was turned off briefly while in cath lab for swan placement. Pharmacy asked to restart heparin 1 hr after return from cath lab.  APTT was supratherapeutic this AM at 158 after increasing drip rate to 1600 units/hr upon transfer. HL also elevated at 1.46 but does not seem to be correlating yet. CBC low but stable. No overt bleeding or infusion issues noted. Will restart heparin 1 hr after cath at lower drip rate.   Goal of Therapy:  Heparin level 0.3-0.7 units/ml aPTT 66 - 102 seconds Monitor platelets by anticoagulation protocol: Yes   Plan:  Restart heparin drip at 1400 units/hr Check 8-hr aPTT Daily aPTT, HL, CBC Monitor for signs and symptoms of bleeding  Richardine Service, PharmD PGY1 Pharmacy Resident Phone: [phone] 10/13/2019  10:16 AM  Please check [email or web address] for unit-specific pharmacy phone numbers.

## 2019-10-13 NOTE — Interval H&P Note (Signed)
History and Physical Interval Note:  10/13/2019 7:56 AM  [name]  has presented today for surgery, with the diagnosis of HF.  The various methods of treatment have been discussed with the patient and family. After consideration of risks, benefits and other options for treatment, the patient has consented to  Procedure(s): Swan Placement (N/A) as a surgical intervention.  The patient's history has been reviewed, patient examined, no change in status, stable for surgery.  I have reviewed the patient's chart and labs.  Questions were answered to the patient's satisfaction.     [name]

## 2019-10-13 NOTE — Progress Notes (Signed)
  Echocardiogram 2D Echocardiogram has been performed.  [name] 10/13/2019, 3:31 PM

## 2019-10-13 NOTE — Progress Notes (Signed)
ANTICOAGULATION CONSULT NOTE - Follow Up Consult  Pharmacy Consult for Heparin Indication: atrial fibrillation  Allergies  Allergen Reactions  . Lisinopril     R/t to kidney  . Codeine Nausea And Vomiting    Patient Measurements: Height: 5\' 5"  (165.1 cm) Weight: (!) 333 lb 12.4 oz (151.4 kg) IBW/kg (Calculated) : 57 Heparin dosing weight: 95.3 kg  Vital Signs: Temp: 97 F (36.1 C) (11/12 1900) Temp Source: Core (11/12 1000) BP: 126/61 (11/12 1900) Pulse Rate: 93 (11/12 1900)  Labs: Recent Labs    10/13/19 0018 10/13/19 0542 10/13/19 0815 10/13/19 0816 10/13/19 1400 10/13/19 1857  HGB 9.4* 9.6* 10.2* 10.2*  --   --   HCT 30.1* 31.1* 30.0* 30.0*  --   --   PLT 73* 75*  --   --   --   --   APTT  --  158*  --   --   --  146*  HEPARINUNFRC  --  1.46*  --   --   --   --   CREATININE 3.02* 2.91*  --   --  2.58*  --     Estimated Creatinine Clearance: 28.2 mL/min (A) (by C-G formula based on SCr of 2.58 mg/dL (H)).   Assessment: 73 yo female transferred to [facility] on a heparin drip for atrial fibrillation.  Patient was on apixaban PTA, last dose unknown. Heparin was turned off briefly while in cath lab for swan placement. Pharmacy asked to restart heparin post cath.  APTT trending down but remains elevated at 146.  Lab drawn appropriately and patient is not bleeding per RN.   Goal of Therapy:  Heparin level 0.3-0.7 units/ml aPTT 66 - 102 seconds Monitor platelets by anticoagulation protocol: Yes   Plan:  Reduce heparin gtt to 1050 units/hr Check 8 hr aPTT  Ida Uppal D. Mina Marble, PharmD, BCPS, [place] 10/13/2019, 8:07 PM

## 2019-10-13 NOTE — Plan of Care (Signed)

## 2019-10-14 LAB — BASIC METABOLIC PANEL
Anion gap: 12 (ref 5–15)
BUN: 50 mg/dL — ABNORMAL HIGH (ref 8–23)
CO2: 23 mmol/L (ref 22–32)
Calcium: 8.1 mg/dL — ABNORMAL LOW (ref 8.9–10.3)
Chloride: 101 mmol/L (ref 98–111)
Creatinine, Ser: 2.27 mg/dL — ABNORMAL HIGH (ref 0.44–1.00)
GFR calc Af Amer: 24 mL/min — ABNORMAL LOW (ref >60)
GFR calc non Af Amer: 20 mL/min — ABNORMAL LOW (ref >60)
Glucose, Bld: 211 mg/dL — ABNORMAL HIGH (ref 70–99)
Potassium: 3.8 mmol/L (ref 3.5–5.1)
Sodium: 136 mmol/L (ref 135–145)

## 2019-10-14 LAB — CBC
HCT: 27.5 % — ABNORMAL LOW (ref 36.0–46.0)
Hemoglobin: 8.5 g/dL — ABNORMAL LOW (ref 12.0–15.0)
MCH: 30.9 pg (ref 26.0–34.0)
MCHC: 30.9 g/dL (ref 30.0–36.0)
MCV: 100 fL (ref 80.0–100.0)
Platelets: 76 10*3/uL — ABNORMAL LOW (ref 150–400)
RBC: 2.75 MIL/uL — ABNORMAL LOW (ref 3.87–5.11)
RDW: 15.4 % (ref 11.5–15.5)
WBC: 6.4 10*3/uL (ref 4.0–10.5)
nRBC: 0 % (ref 0.0–0.2)

## 2019-10-14 LAB — COOXEMETRY PANEL
Carboxyhemoglobin: 1.9 % — ABNORMAL HIGH (ref 0.5–1.5)
Methemoglobin: 1.2 % (ref 0.0–1.5)
O2 Saturation: 68.6 %
Total hemoglobin: 8.7 g/dL — ABNORMAL LOW (ref 12.0–16.0)

## 2019-10-14 LAB — GLUCOSE, CAPILLARY
Glucose-Capillary: 167 mg/dL — ABNORMAL HIGH (ref 70–99)
Glucose-Capillary: 216 mg/dL — ABNORMAL HIGH (ref 70–99)
Glucose-Capillary: 167 mg/dL — ABNORMAL HIGH (ref 70–99)

## 2019-10-14 LAB — APTT
aPTT: 87 seconds — ABNORMAL HIGH (ref 24–36)
aPTT: 72 seconds — ABNORMAL HIGH (ref 24–36)

## 2019-10-14 LAB — MAGNESIUM: Magnesium: 1.9 mg/dL (ref 1.7–2.4)

## 2019-10-14 LAB — HEPARIN LEVEL (UNFRACTIONATED): Heparin Unfractionated: 0.97 IU/mL — ABNORMAL HIGH (ref 0.30–0.70)

## 2019-10-14 MED ORDER — MAGNESIUM SULFATE 2 GM/50ML IV SOLN
2.0000 g | Freq: Once | INTRAVENOUS | Status: AC
  Administered 2019-10-14: 2 g via INTRAVENOUS
  Filled 2019-10-14: qty 50

## 2019-10-14 MED ORDER — DARBEPOETIN ALFA 25 MCG/0.42ML IJ SOSY
25.0000 ug | PREFILLED_SYRINGE | Freq: Once | INTRAMUSCULAR | Status: AC
  Administered 2019-10-14: 25 ug via SUBCUTANEOUS
  Filled 2019-10-14: qty 0.42

## 2019-10-14 MED ORDER — INSULIN ASPART 100 UNIT/ML ~~LOC~~ SOLN
0.0000 [IU] | SUBCUTANEOUS | Status: DC
  Administered 2019-10-14: 5 [IU] via SUBCUTANEOUS
  Administered 2019-10-14 – 2019-10-15 (×5): 3 [IU] via SUBCUTANEOUS
  Administered 2019-10-15: 2 [IU] via SUBCUTANEOUS
  Administered 2019-10-15 – 2019-10-16 (×5): 3 [IU] via SUBCUTANEOUS
  Administered 2019-10-16: 2 [IU] via SUBCUTANEOUS
  Administered 2019-10-16 (×2): 3 [IU] via SUBCUTANEOUS
  Administered 2019-10-16: 16:00:00 2 [IU] via SUBCUTANEOUS
  Administered 2019-10-17: 3 [IU] via SUBCUTANEOUS
  Administered 2019-10-17: 08:00:00 5 [IU] via SUBCUTANEOUS
  Administered 2019-10-17 (×3): 2 [IU] via SUBCUTANEOUS
  Administered 2019-10-17 – 2019-10-18 (×2): 3 [IU] via SUBCUTANEOUS
  Administered 2019-10-18: 5 [IU] via SUBCUTANEOUS
  Administered 2019-10-18: 3 [IU] via SUBCUTANEOUS
  Administered 2019-10-18: 2 [IU] via SUBCUTANEOUS
  Administered 2019-10-18 (×2): 5 [IU] via SUBCUTANEOUS
  Administered 2019-10-19: 2 [IU] via SUBCUTANEOUS
  Administered 2019-10-19 (×3): 3 [IU] via SUBCUTANEOUS
  Administered 2019-10-19 – 2019-10-20 (×2): 2 [IU] via SUBCUTANEOUS
  Administered 2019-10-20 (×2): 3 [IU] via SUBCUTANEOUS
  Administered 2019-10-20: 2 [IU] via SUBCUTANEOUS

## 2019-10-14 MED ORDER — MIDODRINE HCL 5 MG PO TABS
10.0000 mg | ORAL_TABLET | Freq: Three times a day (TID) | ORAL | Status: DC
  Administered 2019-10-14 – 2019-10-25 (×33): 10 mg via ORAL
  Filled 2019-10-14 (×34): qty 2

## 2019-10-14 MED ORDER — METOLAZONE 5 MG PO TABS
5.0000 mg | ORAL_TABLET | Freq: Two times a day (BID) | ORAL | Status: AC
  Administered 2019-10-14 (×2): 5 mg via ORAL
  Filled 2019-10-14 (×2): qty 1

## 2019-10-14 MED ORDER — POTASSIUM CHLORIDE CRYS ER 20 MEQ PO TBCR
20.0000 meq | EXTENDED_RELEASE_TABLET | Freq: Once | ORAL | Status: AC
  Administered 2019-10-14: 20 meq via ORAL
  Filled 2019-10-14: qty 1

## 2019-10-14 NOTE — Progress Notes (Addendum)
Advanced Heart Failure Rounding Note   Subjective:    [name] is a 73 y.o.femalewith morbid obesity, chronic diastolic HF, CKD 3, DM2, pulmonary hypertension, permanent atrial fibrillation, chronic hypoxic respiratory failure on home O2 who is transferred from [facility] 11/12 for further management of a/c diastolic HF, PAH and AKI. At outside hospital, [name] developed hypotension with attempts at diuresis w/ rising creatinine up to 3.2 (prior baseline 1.6). Lasix stopped and given IVF but BP remained low. NE started at 5 and transferred here.   [place] 10/13/19 w/ findings c/w moderate to severe PH w/ cor pulmonale, mildly elevated PCWP and moderate to severely reduced CO by Thermo.  RHC Hemodynamics RA = 19 RV = 84/19 PA = 92/28 (48) PCW = 22 Fick cardiac output/index = 7.7/3.1 Thermo CO/CI = 4.6/1.9 PVR = 5.5 WU (thermo) Ao sat = 99% PA sat = 65%, 67%  Started on inotropic support w/ milrinone 0.125 mcg/kg/min + lasix gtt 15 mg/hr. Remains on NE 2 mcg/min. Midodrine 5 tid also added for BP support.  Has Gordy Councilman Cath   MAPs low 60s-mid 70s.   CVP remains elevated at 16-18  CO: 7.33  CI: 2.98  Co-ox 68.6%   Remains markedly volume overloaded. Wt up 3 lb from yesterday at 336 lb (per chart review, previous wts ~290 lb)  Poor diuresis despite lasix gtt, only 1.8 L out yesterday.   Scr improved 2.91>>2.58>>2.27.  K 3.8, Mg 1.9   Afib (chronic) w/ RVR 110s on tele.  Pt reports subjective improvement. Breathing better today. No complaints.     Objective:   Weight Range:  Vital Signs:   Temp:  [95.5 F (35.3 C)-99.7 F (37.6 C)] 99.5 F (37.5 C) (11/13 0700) Pulse Rate:  [58-103] 94 (11/13 0700) Resp:  [9-24] 18 (11/13 0700) BP: (78-141)/(39-66) 98/48 (11/13 0700) SpO2:  [91 %-100 %] 93 % (11/13 0700) Weight:  [152.6 kg] 152.6 kg (11/13 0530) Last BM Date: 10/11/19  Weight change: Filed Weights   10/12/19 2200 10/13/19 0500 10/14/19 0530  Weight:  (!) 151.4 kg (!) 151.4 kg (!) 152.6 kg    Intake/Output:   Intake/Output Summary (Last 24 hours) at 10/14/2019 0734 Last data filed at 10/14/2019 0700 Gross per 24 hour  Intake 1332.81 ml  Output 1800 ml  Net -467.19 ml     Physical Exam: PHYSICAL EXAM: General:  Morbidly obese WF w/ anasarca. No respiratory difficulty HEENT: normal Neck: elevated JVD to ear, Gordy Councilman cath present RIJ. Carotids 2+ bilat; no bruits. No lymphadenopathy or thyromegaly appreciated. Cor: PMI nondisplaced. irregularly irregular rhythm, mildly tachy rate 2/6 TR murmur  Lungs: clear Abdomen: obese, soft, nontender, nondistended. No hepatosplenomegaly. No bruits or masses. Good bowel sounds. Extremities: no cyanosis, clubbing, rash, markedly edematous bilateral upper and lower extremities. Bilateral unna boots present  Neuro: alert & oriented x 3, cranial nerves grossly intact. moves all 4 extremities w/o difficulty. Affect pleasant.   Telemetry: AF low 100s-110s Personally reviewed   Labs: Basic Metabolic Panel: Recent Labs  Lab 10/13/19 0018 10/13/19 0542 10/13/19 0815 10/13/19 0816 10/13/19 1400 10/14/19 0259  NA 136 137 138 137 136 136  K 3.7 3.6 3.6 3.6 3.5 3.8  CL 105 104  --   --  103 101  CO2 20* 22  --   --  22 23  GLUCOSE 173* 179*  --   --  171* 211*  BUN 47* 46*  --   --  46* 50*  CREATININE  3.02* 2.91*  --   --  2.58* 2.27*  CALCIUM 8.1* 8.1*  --   --  8.1* 8.1*  MG 2.0  --   --   --   --  1.9    Liver Function Tests: Recent Labs  Lab 10/13/19 0018  AST 8*  ALT 12  ALKPHOS 148*  BILITOT 0.4  PROT 5.5*  ALBUMIN 2.5*   No results for input(s): LIPASE, AMYLASE in the last 168 hours. No results for input(s): AMMONIA in the last 168 hours.  CBC: Recent Labs  Lab 10/13/19 0018 10/13/19 0542 10/13/19 0815 10/13/19 0816 10/14/19 0259  WBC 6.5 6.8  --   --  6.4  NEUTROABS 4.7  --   --   --   --   HGB 9.4* 9.6* 10.2* 10.2* 8.5*  HCT 30.1* 31.1* 30.0* 30.0* 27.5*   MCV 98.7 100.0  --   --  100.0  PLT 73* 75*  --   --  76*    Cardiac Enzymes: No results for input(s): CKTOTAL, CKMB, CKMBINDEX, TROPONINI in the last 168 hours.  BNP: BNP (last 3 results) Recent Labs    12/20/18 1700 06/22/19 1452 10/13/19 0018  BNP 212.5* 187.0* 571.9*    ProBNP (last 3 results) No results for input(s): PROBNP in the last 8760 hours.    Other results:  Imaging: US Renal  Result Date: 10/13/2019 CLINICAL DATA:  Acute kidney injury EXAM: RENAL / URINARY TRACT ULTRASOUND COMPLETE COMPARISON:  None. FINDINGS: Right Kidney: Renal measurements: 10.0 x 6.6 x 4.8 cm = volume: 165 mL. There is a small anechoic 2 cm lesion seen within the upper pole. There is diffuse cortical thinning. Left Kidney: Renal measurements: 11.0 x 6.7 x 5.3 cm = volume: 203 mL. There is diffuse cortical thinning. A 2.3 cm anechoic lesion seen within the upper pole. Bladder: Decompressed due to Foley. Other: None. IMPRESSION: Bilateral renal cortical thinning and renal cysts. No hydronephrosis. Electronically Signed   By: Prudencio Pair M.D.   On: 10/13/2019 03:47   Dg Chest Port 1 View  Result Date: 10/13/2019 CLINICAL DATA:  Shortness of breath.  CHF. EXAM: PORTABLE CHEST 1 VIEW COMPARISON:  06/22/2019. FINDINGS: Cardiomegaly with bilateral infiltrates again noted. Findings most consistent pulmonary edema. Persistent bibasilar atelectasis. Bilateral pleural effusions again noted. No pneumothorax. Prior cervical spine fusion IMPRESSION: 1. Cardiomegaly with bilateral pulmonary infiltrates again noted. Findings most consistent with pulmonary edema. Bilateral pleural effusions again noted. Findings consistent with CHF. Similar findings noted on prior exam. 2.  Persistent bibasilar atelectasis. Electronically Signed   By: Marcello Moores  Register   On: 10/13/2019 07:08     Medications:     Scheduled Medications: . Chlorhexidine Gluconate Cloth  6 each Topical Daily  . mouth rinse  15 mL Mouth Rinse  BID  . midodrine  5 mg Oral TID WC  . mupirocin ointment  1 application Nasal BID  . sodium chloride flush  3 mL Intravenous Q12H    Infusions: . sodium chloride    . furosemide (LASIX) infusion 15 mg/hr (10/14/19 0700)  . heparin 1,050 Units/hr (10/14/19 0700)  . milrinone 0.125 mcg/kg/min (10/14/19 0700)  . norepinephrine (LEVOPHED) Adult infusion 2 mcg/min (10/14/19 0700)    PRN Medications: sodium chloride, acetaminophen, ondansetron (ZOFRAN) IV, sodium chloride flush   Assessment/Plan:   1. PAH with cor pulmonale, end-stage - RHC numbers as above with PAP 92/28 (48) and PCWP 21 - Likely multifactorial WHO Group, I,II,III - marked R-sided volume overload complicated  by cardio renal syndrome - started milrinone and lasix gtt 11/12. Co-ox 68%, CO 7.33. CI 2.98 - diuresis sluggish despite lasix gtt at 15 mg/hr, only 1.8L out yesterday. SCr improving, down from 2.91>>2.58>>2.27. MAPs marginal despite NE and midodrine - Consider addition of CRRT for volume removal - will supp K and Mg   2. Acute on chronic diastolic heart failure with R>L HF - Echo 7/20 EF 60-65% RV reportedly normal with RVSP 85 - d/c weight in 7/20 was 127 kg. Now 151.4 kg - diuresis limited by hypotension and A/CKD - suspect significant component of cardiorenal syndrome - Plan as above. May need addition of CRRT for volume removal   3. 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 SCr trending down from 2.91>>2.58>>2.27 - Hold nephrotoxic agents - Support BP  - Renal u/s with severe bilateral cortical thinning - continue to monitor w/ diuresis. May need CRRT   4. Hypotension - Improved with midodrine, 5 tid - remains on low dose NE 2 mcg/min - MAPs in the low 60s-mid 70s - Will need continued support while on milrinone + lasix gtt   5. Permanent AF - On eliquis and cardizem at home. - Heparin while here. Hold cardizem with low BP - current rates in  the low 100s. Monitor  - Monitor electrolytes and keep K and Mg stable while diuresing.  6. Chronic hypoxic respiratory failure on home O2 - non-smoker. Suspect OSA /OHS/PAH - ABG at OSH  7.30/49/83/97% (only mild CO2 retention)   7 Severe obesity (BMI >= 40)  -Body mass index is 55.54 kg/m.  8. Type 2 diabetes mellitus with nephropathy - Reports taking trulicity and 2u insulin daily at home - A1c 7.9 - Low-carb diet. Cover with SSI   9. Anemia of Chronic disease - consider Aranesp  10. Thrombocytopenia  - plt ct low 70k range and has been stable. On IV heparin - monitor closely, if significant drop check HIT panel    Length of Stay: 2   Wendy Jester MD 10/14/2019, 7:34 AM  Advanced Heart Failure Team Pager [phone] (M-F; [place])  Please contact [facility] for night-coverage after hours (4p -7a ) and weekends on [email or web address]  Agree with above.   Remains very tenuous. Now on NE and milrinone. RHC yesterday with severe PAH. PA pressures down significantly with milrinone but urine output remains sluggish. Creatinine improving. [name] has chronic AF rate now up after holding diltiazem and starting inotropes. Breathing better. Extremely weak  Swan numbers done personally today  CVP 16 PA 73/32 (45) PCWP 17 Thermo 5.5/2.25  General:  Chronically ill appearing. No resp difficulty HEENT: normal Neck: supple. RIJ swan Carotids 2+ bilat; no bruits. No lymphadenopathy or thryomegaly appreciated. Cor: PMI nonpalpable Irregular tachy  Lungs: clear anteriorly Abdomen: obese soft, nontender, nondistended. No hepatosplenomegaly. No bruits or masses. Good bowel sounds. Extremities: no cyanosis, clubbing, rash, 4+ edema Neuro: alert & orientedx3, cranial nerves grossly intact. moves all 4 extremities w/o difficulty. Affect pleasant  PA pressures improved with milrinone. Renal function improving but urine output remains sluggish. Will continue milrinone. Increase midodrine  to facilitate NE wean. Increase lasix gtt to 20 and add metolazone 5 bid. Eventually will need to add sildenafil once off NE.  AF rate elevated. May need to add amio for rate control. Continue heparin for now.   Overall prognosis quite poor with end-stage PAH, cor pulmonale. Will see if we can improve  her with volume removal but may be heading toward more palliative course.   CRITICAL CARE Performed by: Glori Bickers  Total critical care time: 35 minutes  Critical care time was exclusive of separately billable procedures and treating other patients.  Critical care was necessary to treat or prevent imminent or life-threatening deterioration.  Critical care was time spent personally by me (independent of midlevel providers or residents) on the following activities: development of treatment plan with patient and/or surrogate as well as nursing, discussions with consultants, evaluation of patient's response to treatment, examination of patient, obtaining history from patient or surrogate, ordering and performing treatments and interventions, ordering and review of laboratory studies, ordering and review of radiographic studies, pulse oximetry and re-evaluation of patient's condition.  Glori Bickers, MD  10:22 AM

## 2019-10-14 NOTE — Progress Notes (Addendum)
ANTICOAGULATION CONSULT NOTE - Follow Up Consult  Pharmacy Consult for Heparin Indication: atrial fibrillation  Allergies  Allergen Reactions  . Lisinopril     R/t to kidney  . Codeine Nausea And Vomiting    Patient Measurements: Height: 5\' 5"  (165.1 cm) Weight: (!) 336 lb 6.8 oz (152.6 kg) IBW/kg (Calculated) : 57 Heparin dosing weight: 95.3 kg  Vital Signs: Temp: 99.5 F (37.5 C) (11/13 1200) Temp Source: Core (11/13 0400) BP: 124/47 (11/13 1200) Pulse Rate: 97 (11/13 1200)  Labs: Recent Labs    10/13/19 0018  10/13/19 0542 10/13/19 0815 10/13/19 0816 10/13/19 1400 10/13/19 1857 10/14/19 0259 10/14/19 1041  HGB 9.4*  --  9.6* 10.2* 10.2*  --   --  8.5*  --   HCT 30.1*  --  31.1* 30.0* 30.0*  --   --  27.5*  --   PLT 73*  --  75*  --   --   --   --  76*  --   APTT  --    < > 158*  --   --   --  146* 87* 72*  HEPARINUNFRC  --   --  1.46*  --   --   --   --  0.97*  --   CREATININE 3.02*  --  2.91*  --   --  2.58*  --  2.27*  --    < > = values in this interval not displayed.    Estimated Creatinine Clearance: 32.2 mL/min (A) (by C-G formula based on SCr of 2.27 mg/dL (H)).   Assessment: 73 yo female transferred to [facility] on a heparin drip for atrial fibrillation.  Patient was on apixaban PTA, last dose unknown. Heparin was turned off briefly while in cath lab for swan placement. Pharmacy asked to restart heparin post cath.  APTT therapeutic x2 on drip rate 1050 units/hr. Hgb low and trending down at 8.5, plts low but stable in 70s. No overt bleeding or infusion issues noted. Will continue to monitor daily aPTT and HL until levels correlate.   Goal of Therapy:  Heparin level 0.3-0.7 units/ml aPTT 66 - 102 seconds Monitor platelets by anticoagulation protocol: Yes   Plan:  Continue heparin gtt to 1050 units/hr Daily aPTT, HL, CBC Monitor for signs of bleeding  Richardine Service, PharmD PGY1 Pharmacy Resident Phone: [phone] 10/14/2019  12:27  PM  Please check [email or web address] for unit-specific pharmacy phone numbers.

## 2019-10-14 NOTE — Progress Notes (Signed)
ANTICOAGULATION CONSULT NOTE - Follow Up Consult  Pharmacy Consult for heparin Indication: atrial fibrillation  Labs: Recent Labs    10/13/19 0018 10/13/19 0542 10/13/19 0815 10/13/19 0816 10/13/19 1400 10/13/19 1857 10/14/19 0259  HGB 9.4* 9.6* 10.2* 10.2*  --   --  8.5*  HCT 30.1* 31.1* 30.0* 30.0*  --   --  27.5*  PLT 73* 75*  --   --   --   --  76*  APTT  --  158*  --   --   --  146* 87*  HEPARINUNFRC  --  1.46*  --   --   --   --  0.97*  CREATININE 3.02* 2.91*  --   --  2.58*  --   --     Assessment/Plan:  73yo female therapeutic on heparin after rate changes. Will continue gtt at current rate and confirm stable with additional PTT.   Wynona Neat, PharmD, BCPS  10/14/2019,3:45 AM

## 2019-10-15 LAB — VITAMIN B12: Vitamin B-12: 473 pg/mL (ref 180–914)

## 2019-10-15 LAB — RETICULOCYTES
Immature Retic Fract: 22 % — ABNORMAL HIGH (ref 2.3–15.9)
RBC.: 2.68 MIL/uL — ABNORMAL LOW (ref 3.87–5.11)
Retic Count, Absolute: 39.1 K/uL (ref 19.0–186.0)
Retic Ct Pct: 1.5 % (ref 0.4–3.1)

## 2019-10-15 LAB — BASIC METABOLIC PANEL
Anion gap: 8 (ref 5–15)
BUN: 53 mg/dL — ABNORMAL HIGH (ref 8–23)
CO2: 26 mmol/L (ref 22–32)
Calcium: 8.2 mg/dL — ABNORMAL LOW (ref 8.9–10.3)
Chloride: 103 mmol/L (ref 98–111)
Creatinine, Ser: 2.08 mg/dL — ABNORMAL HIGH (ref 0.44–1.00)
GFR calc Af Amer: 26 mL/min — ABNORMAL LOW (ref >60)
GFR calc non Af Amer: 23 mL/min — ABNORMAL LOW (ref >60)
Glucose, Bld: 172 mg/dL — ABNORMAL HIGH (ref 70–99)
Potassium: 3.9 mmol/L (ref 3.5–5.1)
Sodium: 137 mmol/L (ref 135–145)

## 2019-10-15 LAB — FOLATE: Folate: 7.6 ng/mL (ref >5.9)

## 2019-10-15 LAB — GLUCOSE, CAPILLARY
Glucose-Capillary: 150 mg/dL — ABNORMAL HIGH (ref 70–99)
Glucose-Capillary: 158 mg/dL — ABNORMAL HIGH (ref 70–99)
Glucose-Capillary: 160 mg/dL — ABNORMAL HIGH (ref 70–99)
Glucose-Capillary: 169 mg/dL — ABNORMAL HIGH (ref 70–99)
Glucose-Capillary: 171 mg/dL — ABNORMAL HIGH (ref 70–99)
Glucose-Capillary: 176 mg/dL — ABNORMAL HIGH (ref 70–99)
Glucose-Capillary: 188 mg/dL — ABNORMAL HIGH (ref 70–99)

## 2019-10-15 LAB — APTT: aPTT: 68 seconds — ABNORMAL HIGH (ref 24–36)

## 2019-10-15 LAB — CBC
HCT: 26.9 % — ABNORMAL LOW (ref 36.0–46.0)
Hemoglobin: 8.2 g/dL — ABNORMAL LOW (ref 12.0–15.0)
MCH: 30.6 pg (ref 26.0–34.0)
MCHC: 30.5 g/dL (ref 30.0–36.0)
MCV: 100.4 fL — ABNORMAL HIGH (ref 80.0–100.0)
Platelets: 76 10*3/uL — ABNORMAL LOW (ref 150–400)
RBC: 2.68 MIL/uL — ABNORMAL LOW (ref 3.87–5.11)
RDW: 15.1 % (ref 11.5–15.5)
WBC: 6 10*3/uL (ref 4.0–10.5)
nRBC: 0 % (ref 0.0–0.2)

## 2019-10-15 LAB — HEPARIN LEVEL (UNFRACTIONATED): Heparin Unfractionated: 0.62 IU/mL (ref 0.30–0.70)

## 2019-10-15 LAB — MAGNESIUM: Magnesium: 2 mg/dL (ref 1.7–2.4)

## 2019-10-15 LAB — FERRITIN: Ferritin: 131 ng/mL (ref 11–307)

## 2019-10-15 LAB — COOXEMETRY PANEL
Carboxyhemoglobin: 1.9 % — ABNORMAL HIGH (ref 0.5–1.5)
Methemoglobin: 1 % (ref 0.0–1.5)
O2 Saturation: 74.8 %
Total hemoglobin: 9.5 g/dL — ABNORMAL LOW (ref 12.0–16.0)

## 2019-10-15 LAB — IRON AND TIBC
Iron: 25 ug/dL — ABNORMAL LOW (ref 28–170)
Saturation Ratios: 10 % — ABNORMAL LOW (ref 10.4–31.8)
TIBC: 262 ug/dL (ref 250–450)
UIBC: 237 ug/dL

## 2019-10-15 MED ORDER — POTASSIUM CHLORIDE CRYS ER 20 MEQ PO TBCR
40.0000 meq | EXTENDED_RELEASE_TABLET | Freq: Once | ORAL | Status: AC
  Administered 2019-10-15: 40 meq via ORAL
  Filled 2019-10-15: qty 2

## 2019-10-15 MED ORDER — METOLAZONE 5 MG PO TABS
5.0000 mg | ORAL_TABLET | Freq: Two times a day (BID) | ORAL | Status: DC
  Administered 2019-10-15 – 2019-10-22 (×15): 5 mg via ORAL
  Filled 2019-10-15 (×15): qty 1

## 2019-10-15 NOTE — Progress Notes (Addendum)
Patient ID: [name], female   DOB: [birth year], 73 y.o.   MRN: [id number]    Advanced Heart Failure Rounding Note   Subjective:    [name] is a 73 y.o.femalewith morbid obesity, chronic diastolic HF, CKD 3, DM2, pulmonary hypertension, permanent atrial fibrillation, chronic hypoxic respiratory failure on home O2 who is transferred from [facility] 11/12 for further management of a/c diastolic HF, PAH and AKI. At outside hospital, she developed hypotension with attempts at diuresis w/ rising creatinine up to 3.2 (prior baseline 1.6). Lasix stopped and given IVF but BP remained low. NE started at 5 and transferred here.   [place] 10/13/19 w/ findings c/w moderate to severe PH w/ cor pulmonale, mildly elevated PCWP and moderate to severely reduced CO by Thermo.  RHC Hemodynamics RA = 19 RV = 84/19 PA = 92/28 (48) PCW = 22 Fick cardiac output/index = 7.7/3.1 Thermo CO/CI = 4.6/1.9 PVR = 5.5 WU (thermo) Ao sat = 99% PA sat = 65%, 67%  Started on inotropic support w/ milrinone 0.125 mcg/kg/min + lasix gtt 20 mg/hr. Remains on NE 2 mcg/min. Midodrine 10 tid also added for BP support.  She got metolazone 5 mg bid yesterday.  I/Os negative but not markedly so.   MAP upper 60s on current support.   Creatinine trending down, 2.08 today.  She remains in rate-controlled atrial fibrillation.   Breathing subjectively better.   Swan: CVP 15 PA 88/33 CI 2.6 Co-ox 75%     Objective:   Weight Range:  Vital Signs:   Temp:  [98.2 F (36.8 C)-99.7 F (37.6 C)] 98.2 F (36.8 C) (11/14 0800) Pulse Rate:  [79-107] 95 (11/14 0800) Resp:  [4-31] 21 (11/14 0800) BP: (84-129)/(35-86) 100/86 (11/14 0800) SpO2:  [92 %-98 %] 94 % (11/14 0800) Weight:  [151.6 kg] 151.6 kg (11/14 0500) Last BM Date: 10/11/19  Weight change: Filed Weights   10/13/19 0500 10/14/19 0530 10/15/19 0500  Weight: (!) 151.4 kg (!) 152.6 kg (!) 151.6 kg    Intake/Output:   Intake/Output Summary (Last  24 hours) at 10/15/2019 1005 Last data filed at 10/15/2019 0800 Gross per 24 hour  Intake 1459.53 ml  Output 2550 ml  Net -1090.47 ml     Physical Exam: General: NAD Neck: JVP 12-14 cm, no thyromegaly or thyroid nodule.  Lungs: Decreased at bases.  CV: Nondisplaced PMI.  Heart irregular S1/S2, no S3/S4, no murmur.  1+ edema to knees.   Abdomen: Soft, nontender, no hepatosplenomegaly, no distention.  Skin: Intact without lesions or rashes.  Neurologic: Alert and oriented x 3.  Psych: Normal affect. Extremities: No clubbing or cyanosis.  HEENT: Normal.    Telemetry: AF 80s Personally reviewed   Labs: Basic Metabolic Panel: Recent Labs  Lab 10/13/19 0018 10/13/19 0542 10/13/19 0815 10/13/19 0816 10/13/19 1400 10/14/19 0259 10/15/19 0410  NA 136 137 138 137 136 136 137  K 3.7 3.6 3.6 3.6 3.5 3.8 3.9  CL 105 104  --   --  103 101 103  CO2 20* 22  --   --  22 23 26   GLUCOSE 173* 179*  --   --  171* 211* 172*  BUN 47* 46*  --   --  46* 50* 53*  CREATININE 3.02* 2.91*  --   --  2.58* 2.27* 2.08*  CALCIUM 8.1* 8.1*  --   --  8.1* 8.1* 8.2*  MG 2.0  --   --   --   --  1.9 2.0  Liver Function Tests: Recent Labs  Lab 10/13/19 0018  AST 8*  ALT 12  ALKPHOS 148*  BILITOT 0.4  PROT 5.5*  ALBUMIN 2.5*   No results for input(s): LIPASE, AMYLASE in the last 168 hours. No results for input(s): AMMONIA in the last 168 hours.  CBC: Recent Labs  Lab 10/13/19 0018 10/13/19 0542 10/13/19 0815 10/13/19 0816 10/14/19 0259 10/15/19 0410  WBC 6.5 6.8  --   --  6.4 6.0  NEUTROABS 4.7  --   --   --   --   --   HGB 9.4* 9.6* 10.2* 10.2* 8.5* 8.2*  HCT 30.1* 31.1* 30.0* 30.0* 27.5* 26.9*  MCV 98.7 100.0  --   --  100.0 100.4*  PLT 73* 75*  --   --  76* 76*    Cardiac Enzymes: No results for input(s): CKTOTAL, CKMB, CKMBINDEX, TROPONINI in the last 168 hours.  BNP: BNP (last 3 results) Recent Labs    12/20/18 1700 06/22/19 1452 10/13/19 0018  BNP 212.5* 187.0*  571.9*    ProBNP (last 3 results) No results for input(s): PROBNP in the last 8760 hours.    Other results:  Imaging: No results found.   Medications:     Scheduled Medications: . Chlorhexidine Gluconate Cloth  6 each Topical Daily  . insulin aspart  0-15 Units Subcutaneous Q4H  . mouth rinse  15 mL Mouth Rinse BID  . metolazone  5 mg Oral BID  . midodrine  10 mg Oral TID WC  . mupirocin ointment  1 application Nasal BID  . potassium chloride  40 mEq Oral Once  . sodium chloride flush  3 mL Intravenous Q12H    Infusions: . sodium chloride    . furosemide (LASIX) infusion 20 mg/hr (10/15/19 0800)  . heparin 1,050 Units/hr (10/15/19 0800)  . milrinone 0.125 mcg/kg/min (10/15/19 0800)  . norepinephrine (LEVOPHED) Adult infusion 2 mcg/min (10/15/19 0800)    PRN Medications: sodium chloride, acetaminophen, ondansetron (ZOFRAN) IV, sodium chloride flush   Assessment/Plan:   1. PAH with cor pulmonale, end-stage - RHC numbers as above with PAP 92/28 (48) and PCWP 21 - Likely multifactorial WHO Group, I,II,III - marked R-sided volume overload complicated by cardio renal syndrome - started milrinone and lasix gtt 11/12, had metolazone 5 mg bid yesterday. UOP not vigorous yesterday but better.  CVP down to 15-16.  Will continue Lasix gtt 20 mg/hr and metolazone 5 mg bid today.  Creatinine lower at 2.08.  - MAP stable on milrinone 0.125, midodrine 10 mg tid, NE 2.  Co-ox 75%, CI 2.6 today.  - Add sildenafil when off norepinephrine.   2. Acute on chronic diastolic heart failure with R>L HF - Echo 7/20 EF 60-65% RV reportedly normal with RVSP 85 - d/c weight in 7/20 was 127 kg. Now 151.4 kg - diuresis limited by hypotension and A/CKD - suspect significant component of cardiorenal syndrome - Plan as above.   3. 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 SCr trending down from 2.91>>2.58>>2.27>>2.08.  - Hold nephrotoxic agents  - Support BP  - Renal u/s with severe bilateral cortical thinning - continue to monitor w/ diuresis. May need CRRT, but right now CVP seems to be trending down with diuresis.   4. Hypotension - Improved with midodrine 10 mg tid - remains on low dose NE 2 mcg/min - MAPs in the upper 60s - Will need continued support while on milrinone +  lasix gtt   5. Permanent AF - On eliquis and cardizem at home. - Heparin while here. Hold cardizem with low BP - current rates in the 80s. Monitor  - Monitor electrolytes and keep K and Mg stable while diuresing.  6. Chronic hypoxic respiratory failure on home O2 - non-smoker. Suspect OSA /OHS/PAH - ABG at OSH  7.30/49/83/97% (only mild CO2 retention)   7 Severe obesity (BMI >= 40)  -Body mass index is 55.54 kg/m.  8. Type 2 diabetes mellitus with nephropathy - Reports taking trulicity and 2u insulin daily at home - A1c 7.9 - Low-carb diet. Cover with SSI   9. Anemia of Chronic disease - consider Aranesp  10. Thrombocytopenia  - plt ct low 70k range and has been stable. On IV heparin - monitor closely, if significant drop check HIT panel   Overall prognosis quite poor with end-stage PAH, cor pulmonale. Will see if we can improve her with volume removal but may be heading toward more palliative course.   CRITICAL CARE Performed by: Loralie Champagne  Total critical care time: 35 minutes  Critical care time was exclusive of separately billable procedures and treating other patients.  Critical care was necessary to treat or prevent imminent or life-threatening deterioration.  Critical care was time spent personally by me on the following activities: development of treatment plan with patient and/or surrogate as well as nursing, discussions with consultants, evaluation of patient's response to treatment, examination of patient, obtaining history from patient or surrogate, ordering and performing treatments and interventions, ordering and  review of laboratory studies, ordering and review of radiographic studies, pulse oximetry and re-evaluation of patient's condition.   Length of Stay: 3   Loralie Champagne MD 10/15/2019, 10:05 AM  Advanced Heart Failure Team Pager [phone] (M-F; 7a - 4p)  Please contact [facility] for night-coverage after hours (4p -7a ) and weekends on [email or web address]

## 2019-10-15 NOTE — Progress Notes (Signed)
ANTICOAGULATION CONSULT NOTE - Follow Up Consult  Pharmacy Consult for Heparin Indication: atrial fibrillation  Allergies  Allergen Reactions  . Lisinopril     R/t to kidney  . Codeine Nausea And Vomiting    Patient Measurements: Height: 5\' 5"  (165.1 cm) Weight: (!) 334 lb 3.5 oz (151.6 kg) IBW/kg (Calculated) : 57 Heparin dosing weight: 95.3 kg  Vital Signs: Temp: 98.4 F (36.9 C) (11/14 1000) Temp Source: Oral (11/14 0800) BP: 108/49 (11/14 1000) Pulse Rate: 87 (11/14 1000)  Labs: Recent Labs    10/13/19 0542  10/13/19 0816 10/13/19 1400  10/14/19 0259 10/14/19 1041 10/15/19 0410  HGB 9.6*   < > 10.2*  --   --  8.5*  --  8.2*  HCT 31.1*   < > 30.0*  --   --  27.5*  --  26.9*  PLT 75*  --   --   --   --  76*  --  76*  APTT 158*  --   --   --    < > 87* 72* 68*  HEPARINUNFRC 1.46*  --   --   --   --  0.97*  --  0.62  CREATININE 2.91*  --   --  2.58*  --  2.27*  --  2.08*   < > = values in this interval not displayed.    Estimated Creatinine Clearance: 35 mL/min (A) (by C-G formula based on SCr of 2.08 mg/dL (H)).   Assessment: 73 yo female transferred to [facility] on a heparin drip for atrial fibrillation.  Patient was on apixaban PTA, last dose unknown. Heparin was turned off briefly while in cath lab for swan placement. Pharmacy asked to restart heparin post cath.  APTT therapeutic on drip rate 1050 units/hr. Hgb low and trending down at 8.2, plts low but stable in 70s. No overt bleeding or infusion issues noted. Will continue to monitor daily aPTT and HL until levels correlate.   Goal of Therapy:  Heparin level 0.3-0.7 units/ml aPTT 66 - 102 seconds Monitor platelets by anticoagulation protocol: Yes   Plan:  Continue heparin gtt to 1050 units/hr Daily aPTT, HL, CBC Monitor for signs of bleeding  Marguerite Olea, [facility] Clinical Pharmacist Phone [phone]  10/15/2019 11:08 AM   Please check [email or web address] for unit-specific pharmacy  phone numbers.

## 2019-10-16 LAB — CBC
HCT: 26.3 % — ABNORMAL LOW (ref 36.0–46.0)
Hemoglobin: 8 g/dL — ABNORMAL LOW (ref 12.0–15.0)
MCH: 30.4 pg (ref 26.0–34.0)
MCHC: 30.4 g/dL (ref 30.0–36.0)
MCV: 100 fL (ref 80.0–100.0)
Platelets: 77 10*3/uL — ABNORMAL LOW (ref 150–400)
RBC: 2.63 MIL/uL — ABNORMAL LOW (ref 3.87–5.11)
RDW: 15.1 % (ref 11.5–15.5)
WBC: 5.8 10*3/uL (ref 4.0–10.5)
nRBC: 0 % (ref 0.0–0.2)

## 2019-10-16 LAB — GLUCOSE, CAPILLARY
Glucose-Capillary: 169 mg/dL — ABNORMAL HIGH (ref 70–99)
Glucose-Capillary: 136 mg/dL — ABNORMAL HIGH (ref 70–99)
Glucose-Capillary: 164 mg/dL — ABNORMAL HIGH (ref 70–99)
Glucose-Capillary: 148 mg/dL — ABNORMAL HIGH (ref 70–99)
Glucose-Capillary: 194 mg/dL — ABNORMAL HIGH (ref 70–99)

## 2019-10-16 LAB — BASIC METABOLIC PANEL
Anion gap: 9 (ref 5–15)
BUN: 54 mg/dL — ABNORMAL HIGH (ref 8–23)
CO2: 27 mmol/L (ref 22–32)
Calcium: 8.7 mg/dL — ABNORMAL LOW (ref 8.9–10.3)
Chloride: 101 mmol/L (ref 98–111)
Creatinine, Ser: 1.73 mg/dL — ABNORMAL HIGH (ref 0.44–1.00)
GFR calc Af Amer: 33 mL/min — ABNORMAL LOW (ref >60)
GFR calc non Af Amer: 28 mL/min — ABNORMAL LOW (ref >60)
Glucose, Bld: 176 mg/dL — ABNORMAL HIGH (ref 70–99)
Potassium: 4.1 mmol/L (ref 3.5–5.1)
Sodium: 137 mmol/L (ref 135–145)

## 2019-10-16 LAB — COOXEMETRY PANEL
Carboxyhemoglobin: 1.5 % (ref 0.5–1.5)
Methemoglobin: 0.6 % (ref 0.0–1.5)
O2 Saturation: 80 %
Total hemoglobin: 8.3 g/dL — ABNORMAL LOW (ref 12.0–16.0)

## 2019-10-16 LAB — APTT
aPTT: 61 seconds — ABNORMAL HIGH (ref 24–36)
aPTT: 54 seconds — ABNORMAL HIGH (ref 24–36)

## 2019-10-16 LAB — HEPARIN LEVEL (UNFRACTIONATED): Heparin Unfractionated: 0.57 IU/mL (ref 0.30–0.70)

## 2019-10-16 MED ORDER — SODIUM CHLORIDE 0.9 % IV SOLN
510.0000 mg | INTRAVENOUS | Status: DC
  Administered 2019-10-16: 10:00:00 510 mg via INTRAVENOUS
  Filled 2019-10-16: qty 17

## 2019-10-16 MED ORDER — SILDENAFIL CITRATE 20 MG PO TABS
20.0000 mg | ORAL_TABLET | Freq: Three times a day (TID) | ORAL | Status: DC
  Administered 2019-10-16 – 2019-10-18 (×7): 20 mg via ORAL
  Filled 2019-10-16 (×7): qty 1

## 2019-10-16 NOTE — Progress Notes (Signed)
Patient ID: CATHE BILGER, female   DOB: 1946/08/02, 73 y.o.   MRN: [id number]    Advanced Heart Failure Rounding Note   Subjective:    Ms. Tomasello is a 73 y.o.femalewith morbid obesity, chronic diastolic HF, CKD 3, DM2, pulmonary hypertension, permanent atrial fibrillation, chronic hypoxic respiratory failure on home O2 who is transferred from [facility] 11/12 for further management of a/c diastolic HF, PAH and AKI. At outside hospital, she developed hypotension with attempts at diuresis w/ rising creatinine up to 3.2 (prior baseline 1.6). Lasix stopped and given IVF but BP remained low. NE started at 5 and transferred here.   [place] 10/13/19 w/ findings c/w moderate to severe PH w/ cor pulmonale, mildly elevated PCWP and moderate to severely reduced CO by Thermo.  RHC Hemodynamics RA = 19 RV = 84/19 PA = 92/28 (48) PCW = 22 Fick cardiac output/index = 7.7/3.1 Thermo CO/CI = 4.6/1.9 PVR = 5.5 WU (thermo) Ao sat = 99% PA sat = 65%, 67%  Started on inotropic support w/ milrinone 0.125 mcg/kg/min + lasix gtt 20 mg/hr. Off norepinephrine today. Midodrine 10 tid also added for BP support.  She got metolazone 5 mg bid yesterday.  I/Os negative with weight down 4 lbs.   Creatinine trending down, 1.73 today.  She remains in rate-controlled atrial fibrillation.   Breathing subjectively better.   Swan: CVP 20 PA 82/22 CI 2.3 Co-ox 80%     Objective:   Weight Range:  Vital Signs:   Temp:  [98.4 F (36.9 C)-99 F (37.2 C)] 99 F (37.2 C) (11/15 0800) Pulse Rate:  [78-114] 81 (11/15 0800) Resp:  [5-30] 29 (11/15 0800) BP: (97-135)/(39-60) 127/60 (11/15 0800) SpO2:  [80 %-99 %] 92 % (11/15 0800) Weight:  [150 kg] 150 kg (11/15 0500) Last BM Date: 10/11/19  Weight change: Filed Weights   10/14/19 0530 10/15/19 0500 10/16/19 0500  Weight: (!) 152.6 kg (!) 151.6 kg (!) 150 kg    Intake/Output:   Intake/Output Summary (Last 24 hours) at 10/16/2019 0925 Last data  filed at 10/16/2019 0800 Gross per 24 hour  Intake 1916.05 ml  Output 3570 ml  Net -1653.95 ml     Physical Exam: General: NAD Neck: JVP 16 cm, no thyromegaly or thyroid nodule.  Lungs: Decreased at bases.  CV: Nondisplaced PMI.  Heart irregular S1/S2, no S3/S4, no murmur.  1+ ankle edema. Abdomen: Soft, nontender, no hepatosplenomegaly, no distention.  Skin: Intact without lesions or rashes.  Neurologic: Alert and oriented x 3.  Psych: Normal affect. Extremities: No clubbing or cyanosis.  HEENT: Normal.   Telemetry: AF 80s Personally reviewed   Labs: Basic Metabolic Panel: Recent Labs  Lab 10/13/19 0018 10/13/19 0542  10/13/19 0816 10/13/19 1400 10/14/19 0259 10/15/19 0410 10/16/19 0339  NA 136 137   < > 137 136 136 137 137  K 3.7 3.6   < > 3.6 3.5 3.8 3.9 4.1  CL 105 104  --   --  103 101 103 101  CO2 20* 22  --   --  22 23 26 27   GLUCOSE 173* 179*  --   --  171* 211* 172* 176*  BUN 47* 46*  --   --  46* 50* 53* 54*  CREATININE 3.02* 2.91*  --   --  2.58* 2.27* 2.08* 1.73*  CALCIUM 8.1* 8.1*  --   --  8.1* 8.1* 8.2* 8.7*  MG 2.0  --   --   --   --  1.9 2.0  --    < > = values in this interval not displayed.    Liver Function Tests: Recent Labs  Lab 10/13/19 0018  AST 8*  ALT 12  ALKPHOS 148*  BILITOT 0.4  PROT 5.5*  ALBUMIN 2.5*   No results for input(s): LIPASE, AMYLASE in the last 168 hours. No results for input(s): AMMONIA in the last 168 hours.  CBC: Recent Labs  Lab 10/13/19 0018 10/13/19 0542 10/13/19 0815 10/13/19 0816 10/14/19 0259 10/15/19 0410 10/16/19 0339  WBC 6.5 6.8  --   --  6.4 6.0 5.8  NEUTROABS 4.7  --   --   --   --   --   --   HGB 9.4* 9.6* 10.2* 10.2* 8.5* 8.2* 8.0*  HCT 30.1* 31.1* 30.0* 30.0* 27.5* 26.9* 26.3*  MCV 98.7 100.0  --   --  100.0 100.4* 100.0  PLT 73* 75*  --   --  76* 76* 77*    Cardiac Enzymes: No results for input(s): CKTOTAL, CKMB, CKMBINDEX, TROPONINI in the last 168 hours.  BNP: BNP (last 3  results) Recent Labs    12/20/18 1700 06/22/19 1452 10/13/19 0018  BNP 212.5* 187.0* 571.9*    ProBNP (last 3 results) No results for input(s): PROBNP in the last 8760 hours.    Other results:  Imaging: No results found.   Medications:     Scheduled Medications: . Chlorhexidine Gluconate Cloth  6 each Topical Daily  . insulin aspart  0-15 Units Subcutaneous Q4H  . mouth rinse  15 mL Mouth Rinse BID  . metolazone  5 mg Oral BID  . midodrine  10 mg Oral TID WC  . mupirocin ointment  1 application Nasal BID  . sodium chloride flush  3 mL Intravenous Q12H    Infusions: . sodium chloride    . ferumoxytol    . furosemide (LASIX) infusion 20 mg/hr (10/16/19 0800)  . heparin 1,200 Units/hr (10/16/19 0844)  . milrinone 0.125 mcg/kg/min (10/16/19 0800)  . norepinephrine (LEVOPHED) Adult infusion Stopped (10/15/19 1820)    PRN Medications: sodium chloride, acetaminophen, ondansetron (ZOFRAN) IV, sodium chloride flush   Assessment/Plan:   1. PAH with cor pulmonale, end-stage - RHC numbers as above with PAP 92/28 (48) and PCWP 21 - Likely multifactorial WHO Group, I,II,III - marked R-sided volume overload complicated by cardiorenal syndrome - started milrinone and lasix gtt 11/12, had metolazone 5 mg bid yesterday. Better UOP, weight and creatinine coming down.  Still marked volume overload with CVP 20.  Will continue Lasix gtt 20 mg/hr and metolazone 5 mg bid today.  - MAP stable on milrinone 0.125, midodrine 10 mg tid, now off norepinephrine.  CI 2.3 today.  - Can add sildenafil 20 mg tid.    2. Acute on chronic diastolic heart failure with R>L HF - Echo 7/20 EF 60-65% RV reportedly normal with RVSP 85 - d/c weight in 7/20 was 127 kg. At admission 151.4 kg - diuresis limited by hypotension and A/CKD - suspect significant component of cardiorenal syndrome - Plan as above.   3. 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  SCr trending down from 2.91>>2.58>>2.27>>2.08>>1.73.  - Hold nephrotoxic agents - Support RV function with milrinone  - Renal u/s with severe bilateral cortical thinning - continue to monitor w/ diuresis. May need CRRT, but currently diuresing better so will stay the course.   4. Hypotension - Improved with midodrine 10  mg tid - Now off norepinephrine.   5. Permanent AF - On eliquis and cardizem at home. - Heparin while here. Hold cardizem with low BP - current rates in the 80s. Monitor  - Monitor electrolytes and keep K and Mg stable while diuresing.  6. Chronic hypoxic respiratory failure on home O2 - non-smoker. Suspect OSA /OHS/PAH - ABG at OSH  7.30/49/83/97% (only mild CO2 retention)   7 Severe obesity (BMI >= 40)  -Body mass index is 55.54 kg/m.  8. Type 2 diabetes mellitus with nephropathy - Reports taking trulicity and 2u insulin daily at home - A1c 7.9 - Low-carb diet. Cover with SSI   9. Anemia of Chronic disease - consider Aranesp  10. Thrombocytopenia  - plt ct low 70k range and has been stable. On IV heparin - monitor closely, if significant drop check HIT panel   Overall prognosis quite poor with end-stage PAH, cor pulmonale. Will see if we can improve her with volume removal but may be heading toward more palliative course.   CRITICAL CARE Performed by: Loralie Champagne  Total critical care time: 35 minutes  Critical care time was exclusive of separately billable procedures and treating other patients.  Critical care was necessary to treat or prevent imminent or life-threatening deterioration.  Critical care was time spent personally by me on the following activities: development of treatment plan with patient and/or surrogate as well as nursing, discussions with consultants, evaluation of patient's response to treatment, examination of patient, obtaining history from patient or surrogate, ordering and performing treatments and interventions, ordering  and review of laboratory studies, ordering and review of radiographic studies, pulse oximetry and re-evaluation of patient's condition.   Length of Stay: 4   Loralie Champagne MD 10/16/2019, 9:25 AM  Advanced Heart Failure Team Pager [phone] (M-F; 7a - 4p)  Please contact [facility] for night-coverage after hours (4p -7a ) and weekends on [email or web address]

## 2019-10-16 NOTE — Progress Notes (Signed)
ANTICOAGULATION CONSULT NOTE - Follow Up Consult  Pharmacy Consult for Heparin Indication: atrial fibrillation  Allergies  Allergen Reactions  . Lisinopril     R/t to kidney  . Codeine Nausea And Vomiting    Patient Measurements: Height: 5\' 5"  (165.1 cm) Weight: (!) 330 lb 11 oz (150 kg) IBW/kg (Calculated) : 57 Heparin dosing weight: 95.3 kg  Vital Signs: Temp: 99.1 F (37.3 C) (11/15 1600) BP: 113/56 (11/15 1600) Pulse Rate: 84 (11/15 1600)  Labs: Recent Labs    10/14/19 0259  10/15/19 0410 10/16/19 0339 10/16/19 0340 10/16/19 1721  HGB 8.5*  --  8.2* 8.0*  --   --   HCT 27.5*  --  26.9* 26.3*  --   --   PLT 76*  --  76* 77*  --   --   APTT 87*   < > 68* 61*  --  54*  HEPARINUNFRC 0.97*  --  0.62  --  0.57  --   CREATININE 2.27*  --  2.08* 1.73*  --   --    < > = values in this interval not displayed.    Estimated Creatinine Clearance: 41.8 mL/min (A) (by C-G formula based on SCr of 1.73 mg/dL (H)).   Assessment: 73 yo female transferred to [facility] on a heparin drip for atrial fibrillation.  Patient was on apixaban PTA, last dose unknown. Heparin was turned off briefly while in cath lab for swan placement. Pharmacy asked to restart heparin post cath.  APTT remains low despite rate adjustment.   Goal of Therapy:  Heparin level 0.3-0.7 units/ml aPTT 66 - 102 seconds Monitor platelets by anticoagulation protocol: Yes   Plan:  Increase heparin to 1350 units/hr Recheck aPTT with morning labs   Arrie Senate, PharmD, BCPS Clinical Pharmacist [phone] Please check AMION for all [place] numbers 10/16/2019

## 2019-10-16 NOTE — Progress Notes (Signed)
ANTICOAGULATION CONSULT NOTE - Follow Up Consult  Pharmacy Consult for Heparin Indication: atrial fibrillation  Allergies  Allergen Reactions  . Lisinopril     R/t to kidney  . Codeine Nausea And Vomiting    Patient Measurements: Height: 5\' 5"  (165.1 cm) Weight: (!) 330 lb 11 oz (150 kg) IBW/kg (Calculated) : 57 Heparin dosing weight: 95.3 kg  Vital Signs: Temp: 99 F (37.2 C) (11/15 0800) Temp Source: Bladder (11/15 0400) BP: 127/60 (11/15 0800) Pulse Rate: 81 (11/15 0800)  Labs: Recent Labs    10/14/19 0259 10/14/19 1041 10/15/19 0410 10/16/19 0339 10/16/19 0340  HGB 8.5*  --  8.2* 8.0*  --   HCT 27.5*  --  26.9* 26.3*  --   PLT 76*  --  76* 77*  --   APTT 87* 72* 68* 61*  --   HEPARINUNFRC 0.97*  --  0.62  --  0.57  CREATININE 2.27*  --  2.08* 1.73*  --     Estimated Creatinine Clearance: 41.8 mL/min (A) (by C-G formula based on SCr of 1.73 mg/dL (H)).   Assessment: 73 yo female transferred to [facility] on a heparin drip for atrial fibrillation.  Patient was on apixaban PTA, last dose unknown. Heparin was turned off briefly while in cath lab for swan placement. Pharmacy asked to restart heparin post cath.  APTT slightly below goal on heparin gtt at 1050 units/hr. Heparin level at goal, but suspect still a little falsely elevated from recent DOAC.  Hgb low, plts low but stable in 70s. No overt bleeding or infusion issues noted. Will continue to monitor daily aPTT and HL until levels correlate.   Goal of Therapy:  Heparin level 0.3-0.7 units/ml aPTT 66 - 102 seconds Monitor platelets by anticoagulation protocol: Yes   Plan:  Increase heparin to 1200 units/hr. Recheck aPTT in 8 hrs. Daily aPTT, HL, CBC Monitor for signs of bleeding  Marguerite Olea, [facility] Clinical Pharmacist Phone [phone]  10/16/2019 8:23 AM   Please check [email or web address] for unit-specific pharmacy phone numbers.

## 2019-10-17 DIAGNOSIS — I2609 Other pulmonary embolism with acute cor pulmonale: Secondary | ICD-10-CM

## 2019-10-17 LAB — BASIC METABOLIC PANEL
Anion gap: 9 (ref 5–15)
BUN: 58 mg/dL — ABNORMAL HIGH (ref 8–23)
CO2: 27 mmol/L (ref 22–32)
Calcium: 8.6 mg/dL — ABNORMAL LOW (ref 8.9–10.3)
Chloride: 100 mmol/L (ref 98–111)
Creatinine, Ser: 1.69 mg/dL — ABNORMAL HIGH (ref 0.44–1.00)
GFR calc Af Amer: 34 mL/min — ABNORMAL LOW (ref >60)
GFR calc non Af Amer: 29 mL/min — ABNORMAL LOW (ref >60)
Glucose, Bld: 157 mg/dL — ABNORMAL HIGH (ref 70–99)
Potassium: 4 mmol/L (ref 3.5–5.1)
Sodium: 136 mmol/L (ref 135–145)

## 2019-10-17 LAB — GLUCOSE, CAPILLARY
Glucose-Capillary: 173 mg/dL — ABNORMAL HIGH (ref 70–99)
Glucose-Capillary: 149 mg/dL — ABNORMAL HIGH (ref 70–99)
Glucose-Capillary: 227 mg/dL — ABNORMAL HIGH (ref 70–99)
Glucose-Capillary: 150 mg/dL — ABNORMAL HIGH (ref 70–99)
Glucose-Capillary: 186 mg/dL — ABNORMAL HIGH (ref 70–99)
Glucose-Capillary: 185 mg/dL — ABNORMAL HIGH (ref 70–99)

## 2019-10-17 LAB — MAGNESIUM: Magnesium: 2 mg/dL (ref 1.7–2.4)

## 2019-10-17 LAB — CBC
HCT: 25.5 % — ABNORMAL LOW (ref 36.0–46.0)
Hemoglobin: 8 g/dL — ABNORMAL LOW (ref 12.0–15.0)
MCH: 30.7 pg (ref 26.0–34.0)
MCHC: 31.4 g/dL (ref 30.0–36.0)
MCV: 97.7 fL (ref 80.0–100.0)
Platelets: 88 10*3/uL — ABNORMAL LOW (ref 150–400)
RBC: 2.61 MIL/uL — ABNORMAL LOW (ref 3.87–5.11)
RDW: 15.3 % (ref 11.5–15.5)
WBC: 5.5 10*3/uL (ref 4.0–10.5)
nRBC: 0 % (ref 0.0–0.2)

## 2019-10-17 LAB — COOXEMETRY PANEL
Carboxyhemoglobin: 1.3 % (ref 0.5–1.5)
Methemoglobin: 0.8 % (ref 0.0–1.5)
O2 Saturation: 69.7 %
Total hemoglobin: 11.8 g/dL — ABNORMAL LOW (ref 12.0–16.0)

## 2019-10-17 LAB — APTT: aPTT: 77 seconds — ABNORMAL HIGH (ref 24–36)

## 2019-10-17 LAB — HEPARIN LEVEL (UNFRACTIONATED): Heparin Unfractionated: 0.57 IU/mL (ref 0.30–0.70)

## 2019-10-17 MED ORDER — SPIRONOLACTONE 12.5 MG HALF TABLET
12.5000 mg | ORAL_TABLET | Freq: Every day | ORAL | Status: DC
  Administered 2019-10-17 – 2019-10-25 (×9): 12.5 mg via ORAL
  Filled 2019-10-17 (×9): qty 1

## 2019-10-17 NOTE — Progress Notes (Addendum)
Patient ID: [name], female   DOB: [birth year], 73 y.o.   MRN: [id number]    Advanced Heart Failure Rounding Note   Subjective:    [name] is a 73 y.o.femalewith morbid obesity, chronic diastolic HF, CKD 3, DM2, pulmonary hypertension, permanent atrial fibrillation, chronic hypoxic respiratory failure on home O2 who is transferred from [facility] 11/12 for further management of a/c diastolic HF, PAH and AKI. At outside hospital, she developed hypotension with attempts at diuresis w/ rising creatinine up to 3.2 (prior baseline 1.6). Lasix stopped and given IVF but BP remained low. NE started at 5 and transferred here.   [place] 10/13/19 w/ findings c/w moderate to severe PH w/ cor pulmonale, mildly elevated PCWP and moderate to severely reduced CO by Thermo.  RHC Hemodynamics RA = 19 RV = 84/19 PA = 92/28 (48) PCW = 22 Fick cardiac output/index = 7.7/3.1 Thermo CO/CI = 4.6/1.9 PVR = 5.5 WU (thermo) Ao sat = 99% PA sat = 65%, 67%  Started on inotropic support w/ milrinone 0.125 mcg/kg/min + lasix gtt 20 mg/hr. Also getting metolazone bid. Norepinephrine discontinued yesterday. Remains on midodrine 10 tid for BP support. Sildenafil added 11/15 for PH. MAPs mid 70s this morning.  Good response to diuretics w/ additional 3.4L out yesterday.   Wt down 10 lb, from 330>>320 lb.   Creatinine continues to improve trending down, 2.08>>1.73>>1.69 today. Electrolytes stable. K 4.0. Mg 2.0.  She remains in rate-controlled atrial fibrillation, HR mid 80s   States she is feeling better. No complaints.   Swan: CVP 14 PA 77/23 (41) CI 2.3 CO 5.65 Co-ox 69%     Objective:   Weight Range:  Vital Signs:   Temp:  [98.2 F (36.8 C)-99.5 F (37.5 C)] 98.2 F (36.8 C) (11/16 0700) Pulse Rate:  [75-86] 86 (11/16 0700) Resp:  [13-29] 23 (11/16 0700) BP: (98-127)/(41-62) 126/62 (11/16 0700) SpO2:  [88 %-97 %] 96 % (11/16 0700) Weight:  [145.5 kg] 145.5 kg (11/16 0448) Last BM  Date: 10/11/19  Weight change: Filed Weights   10/15/19 0500 10/16/19 0500 10/17/19 0448  Weight: (!) 151.6 kg (!) 150 kg (!) 145.5 kg    Intake/Output:   Intake/Output Summary (Last 24 hours) at 10/17/2019 0737 Last data filed at 10/17/2019 0700 Gross per 24 hour  Intake 1638.97 ml  Output 3475 ml  Net -1836.03 ml     Physical Exam: PHYSICAL EXAM: General:  Super morbidly obese WF. No respiratory difficulty HEENT: normal Neck: supple. elevated JVD. Gordy Councilman cath in RIG, Carotids 2+ bilat; no bruits. No lymphadenopathy or thyromegaly appreciated. Cor: PMI nondisplaced. irregularly irregular rhythm, regular rate, 2/6 TR murmur. Lungs: distant BS bilaterally ? Due to body habitus  Abdomen: obese, soft, nontender, nondistended. No hepatosplenomegaly. No bruits or masses. Good bowel sounds. Extremities: no cyanosis, clubbing, rash. markedly edematous bilateral upper and lower extremities, unna boots lower extremities Neuro: alert & oriented x 3, cranial nerves grossly intact. moves all 4 extremities w/o difficulty. Affect pleasant.   Telemetry: persistent atrial fibrillation, mid 80s Personally reviewed   Labs: Basic Metabolic Panel: Recent Labs  Lab 10/13/19 0018  10/13/19 1400 10/14/19 0259 10/15/19 0410 10/16/19 0339 10/17/19 0309  NA 136   < > 136 136 137 137 136  K 3.7   < > 3.5 3.8 3.9 4.1 4.0  CL 105   < > 103 101 103 101 100  CO2 20*   < > 22 23 26 27 27   GLUCOSE 173*   < >  171* 211* 172* 176* 157*  BUN 47*   < > 46* 50* 53* 54* 58*  CREATININE 3.02*   < > 2.58* 2.27* 2.08* 1.73* 1.69*  CALCIUM 8.1*   < > 8.1* 8.1* 8.2* 8.7* 8.6*  MG 2.0  --   --  1.9 2.0  --  2.0   < > = values in this interval not displayed.    Liver Function Tests: Recent Labs  Lab 10/13/19 0018  AST 8*  ALT 12  ALKPHOS 148*  BILITOT 0.4  PROT 5.5*  ALBUMIN 2.5*   No results for input(s): LIPASE, AMYLASE in the last 168 hours. No results for input(s): AMMONIA in the last 168  hours.  CBC: Recent Labs  Lab 10/13/19 0018 10/13/19 0542  10/13/19 0816 10/14/19 0259 10/15/19 0410 10/16/19 0339 10/17/19 0309  WBC 6.5 6.8  --   --  6.4 6.0 5.8 5.5  NEUTROABS 4.7  --   --   --   --   --   --   --   HGB 9.4* 9.6*   < > 10.2* 8.5* 8.2* 8.0* 8.0*  HCT 30.1* 31.1*   < > 30.0* 27.5* 26.9* 26.3* 25.5*  MCV 98.7 100.0  --   --  100.0 100.4* 100.0 97.7  PLT 73* 75*  --   --  76* 76* 77* 88*   < > = values in this interval not displayed.    Cardiac Enzymes: No results for input(s): CKTOTAL, CKMB, CKMBINDEX, TROPONINI in the last 168 hours.  BNP: BNP (last 3 results) Recent Labs    12/20/18 1700 06/22/19 1452 10/13/19 0018  BNP 212.5* 187.0* 571.9*    ProBNP (last 3 results) No results for input(s): PROBNP in the last 8760 hours.    Other results:  Imaging: No results found.   Medications:     Scheduled Medications: . Chlorhexidine Gluconate Cloth  6 each Topical Daily  . insulin aspart  0-15 Units Subcutaneous Q4H  . mouth rinse  15 mL Mouth Rinse BID  . metolazone  5 mg Oral BID  . midodrine  10 mg Oral TID WC  . mupirocin ointment  1 application Nasal BID  . sildenafil  20 mg Oral TID  . sodium chloride flush  3 mL Intravenous Q12H    Infusions: . sodium chloride Stopped (10/16/19 1017)  . ferumoxytol Stopped (10/16/19 0959)  . furosemide (LASIX) infusion 20 mg/hr (10/17/19 0700)  . heparin 1,350 Units/hr (10/17/19 0700)  . milrinone 0.125 mcg/kg/min (10/17/19 0700)  . norepinephrine (LEVOPHED) Adult infusion Stopped (10/15/19 1820)    PRN Medications: sodium chloride, acetaminophen, ondansetron (ZOFRAN) IV, sodium chloride flush   Assessment/Plan:   1. PAH with cor pulmonale, end-stage - RHC numbers as above with PAP 92/28 (48) and PCWP 21 - Likely multifactorial WHO Group, I,II,III - marked R-sided volume overload complicated by cardiorenal syndrome - started milrinone and lasix gtt 11/12, + metolazone 5 mg bid.  - Good  UOP w/ additional 3.4L out yesterday. Wt down 10 lb from yesterday, 330>>320 lb. Creatinine continues to improve.  Still marked volume overload with CVP at 13.  Will continue Lasix gtt 20 mg/hr and metolazone 5 mg bid today. Continue bilateral unna boots - Continue sildenafil 20 mg tid  - MAP stable on milrinone 0.125, midodrine 10 mg tid. CI 2.3 today.    2. Acute on chronic diastolic heart failure with R>L HF - Echo 7/20 EF 60-65% RV reportedly normal with RVSP 85 - d/c weight  in 7/20 was 127 kg. At admission 151.4 kg - diuresis limited by hypotension and A/CKD - suspect significant component of cardiorenal syndrome - Plan as above.   3. 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 SCr trending down from 2.91>>2.58>>2.27>>2.08>>1.73>>1.69 - Hold nephrotoxic agents - Support RV function with milrinone  - Renal u/s with severe bilateral cortical thinning - continue to monitor w/ diuresis. May need CRRT, but currently diuresing better so will stay the course.   4. Hypotension - Improved with midodrine 10 mg tid - Now off norepinephrine.  - MAPs in the mid 70s  5. Permanent AF - On eliquis and cardizem at home. - Heparin while here. Hold cardizem with low BP - current rates in the mid 80s. Monitor  - Monitor electrolytes and keep K and Mg stable while diuresing.  6. Chronic hypoxic respiratory failure on home O2 - non-smoker. Suspect OSA /OHS/PAH - ABG at OSH  7.30/49/83/97% (only mild CO2 retention)   7 Severe obesity (BMI >= 40)  -Body mass index is 55.54 kg/m.  8. Type 2 diabetes mellitus with nephropathy - Reports taking trulicity and 2u insulin daily at home - A1c 7.9 - Low-carb diet. Cover with SSI   9. Anemia of Chronic disease - consider Aranesp  10. Thrombocytopenia  - plt ct improving from 77K >>88K. On IV heparin - monitor closely, if significant drop check HIT panel   Overall prognosis quite poor with  end-stage PAH, cor pulmonale. Will see if we can improve her with volume removal but may be heading toward more palliative course.    Length of Stay: [place] MD 10/17/2019, 7:37 AM  Advanced Heart Failure Team Pager [phone] (M-F; [place])  Please contact [facility] for night-coverage after hours (4p -7a ) and weekends on [email or web address]  Agree with above.   Remains very tenuous. NE stopped yesterday but remains on milrinone and midodrine. Sildenafil added. On lasix gtt at 20 and also getting metolazone. PA pressures remain in 70s. Volume improving. Creatinine trending down with hemodynamic support.   Swan: CVP 14 PA 77/23 (41) CI 2.3 CO 5.65 Co-ox 69%   General:  Obese woman lying in bed . No resp difficulty HEENT: normal Neck: supple. no JVD. Carotids 2+ bilat; no bruits. No lymphadenopathy or thryomegaly appreciated. Cor: PMI nondisplaced. Irregular rate & rhythm. No rubs, gallops or murmurs. Lungs: clear Abdomen: obese soft, nontender, nondistended. No hepatosplenomegaly. No bruits or masses. Good bowel sounds. Extremities: no cyanosis, clubbing, rash, 2+ edema + UNNA Neuro: alert & orientedx3, cranial nerves grossly intact. moves all 4 extremities w/o difficulty. Affect pleasant  Continues with severe PAH/cor pulmonale but volume status improving with IV diuresis and milrinone support. Sildenafil added and BP is tolerating. Will continue diuresis. Leave swan in for now.   CRITICAL CARE Performed by: Glori Bickers  Total critical care time: 35 minutes  Critical care time was exclusive of separately billable procedures and treating other patients.  Critical care was necessary to treat or prevent imminent or life-threatening deterioration.  Critical care was time spent personally by me (independent of midlevel providers or residents) on the following activities: development of treatment plan with patient and/or surrogate as well as nursing, discussions  with consultants, evaluation of patient's response to treatment, examination of patient, obtaining history from patient or surrogate, ordering and performing treatments and interventions, ordering and review of laboratory studies, ordering and review of radiographic studies,  pulse oximetry and re-evaluation of patient's condition.  Glori Bickers, MD  2:40 PM

## 2019-10-17 NOTE — Progress Notes (Signed)
ANTICOAGULATION CONSULT NOTE - Follow Up Consult  Pharmacy Consult for Heparin Indication: atrial fibrillation  Allergies  Allergen Reactions  . Lisinopril     R/t to kidney  . Codeine Nausea And Vomiting    Patient Measurements: Height: 5\' 5"  (165.1 cm) Weight: (!) 320 lb 12.3 oz (145.5 kg) IBW/kg (Calculated) : 57 Heparin dosing weight: 95.3 kg  Vital Signs: Temp: 98.2 F (36.8 C) (11/16 0700) Temp Source: Bladder (11/16 0400) BP: 126/62 (11/16 0700) Pulse Rate: 86 (11/16 0700)  Labs: Recent Labs    10/15/19 0410 10/16/19 0339 10/16/19 0340 10/16/19 1721 10/17/19 0309  HGB 8.2* 8.0*  --   --  8.0*  HCT 26.9* 26.3*  --   --  25.5*  PLT 76* 77*  --   --  88*  APTT 68* 61*  --  54* 77*  HEPARINUNFRC 0.62  --  0.57  --  0.57  CREATININE 2.08* 1.73*  --   --  1.69*    Estimated Creatinine Clearance: 42 mL/min (A) (by C-G formula based on SCr of 1.69 mg/dL (H)).   Assessment: 73 yo female transferred to [facility] on a heparin drip for atrial fibrillation.  Patient was on apixaban PTA, last dose unknown. Heparin was turned off briefly while in cath lab for swan placement. Pharmacy asked to restart heparin post cath.  APTT and heparin level both at goal this morning on IV heparin at 1350 units/hr.  No overt bleeding or complications noted.  APTTs and heparin levels now appear to be correlating.   Goal of Therapy:  Heparin level 0.3-0.7 units/ml aPTT 66 - 102 seconds Monitor platelets by anticoagulation protocol: Yes   Plan:  Continue IV heparin at current rate. Will monitor based on heparin levels going forward. Daily heparin level and CBC. F/u ability to resume oral anticoagulation eventually.  Marguerite Olea, [facility] Clinical Pharmacist Phone [phone]  10/17/2019 7:57 AM

## 2019-10-17 NOTE — Evaluation (Signed)
Physical Therapy Evaluation Patient Details Name: COLANDRA OHANIAN MRN: [id number] DOB: [birth year] Today's Date: 10/17/2019   History of Present Illness  73 y.o. female with morbid obesity, chronic diastolic HF, CKD 3, DM2, pulmonary hypertension, permanent atrial fibrillation, chronic hypoxic respiratory failure on home O2 who is transferred from [facility] 11/12 for further management of a/c diastolic HF, PAH and AKI.  Clinical Impression  Pt demonstrates deficits in functional mobility, gait, balance, endurance, strength, power. Pt requires significant physical assistance for all functional mobility at this time, with generalized weakness throughout body. Pt with increased work of breathing noted with all mobility, and is limited by fatigue throughout evaluation. Pt will benefit from continued acute PT services to reduce falls risk and aide in a return to her prior level of function.    Follow Up Recommendations SNF    Equipment Recommendations  None recommended by PT(defer to post-acute setting, may need hospital bed)    Recommendations for Other Services       Precautions / Restrictions Precautions Precautions: Fall Restrictions Weight Bearing Restrictions: No      Mobility  Bed Mobility Overal bed mobility: Needs Assistance Bed Mobility: Supine to Sit     Supine to sit: Max assist;+2 for physical assistance;HOB elevated        Transfers Overall transfer level: Needs assistance Equipment used: Rolling walker (2 wheeled) Transfers: Sit to/from [facility] Sit to Stand: Mod assist;+2 physical assistance Stand pivot transfers: Mod assist;+2 physical assistance       General transfer comment: pt with 2+ physical assist and 1 assist for line management  Ambulation/Gait Ambulation/Gait assistance: Mod assist;+2 physical assistance Gait Distance (Feet): 2 Feet Assistive device: Rolling walker (2 wheeled) Gait Pattern/deviations: Step-to  pattern;Shuffle;Wide base of support Gait velocity: reduced Gait velocity interpretation: <1.31 ft/sec, indicative of household ambulator General Gait Details: slow shuffling steps from bed to recliner  Stairs            Wheelchair Mobility    Modified Rankin (Stroke Patients Only)       Balance Overall balance assessment: Needs assistance Sitting-balance support: Bilateral upper extremity supported;Feet supported Sitting balance-Leahy Scale: Fair Sitting balance - Comments: minG at edge of bed for static sitting   Standing balance support: Bilateral upper extremity supported Standing balance-Leahy Scale: Poor Standing balance comment: minA x2 for static standing balance                             Pertinent Vitals/Pain Pain Assessment: No/denies pain    Home Living Family/patient expects to be discharged to:: Private residence Living Arrangements: Children Available Help at Discharge: Family;Available 24 hours/day;Friend(s) Type of Home: House Home Access: Level entry     Home Layout: One level Home Equipment: Other (comment);Walker - 4 wheels;Wheelchair - manual;Shower seat - built in;Toilet riser;Walker - 2 wheels Additional Comments: 3 wheel walker    Prior Function Level of Independence: Independent with assistive device(s)         Comments: household ambulator with 3 wheeled walker     Hand Dominance        Extremity/Trunk Assessment   Upper Extremity Assessment Upper Extremity Assessment: Generalized weakness    Lower Extremity Assessment Lower Extremity Assessment: Generalized weakness    Cervical / Trunk Assessment Cervical / Trunk Assessment: Normal  Communication   Communication: No difficulties  Cognition Arousal/Alertness: Awake/alert Behavior During Therapy: WFL for tasks assessed/performed Overall Cognitive Status: Within Functional Limits for tasks  assessed                                         General Comments General comments (skin integrity, edema, etc.): VSS, pt on 6L HFNC during session. Swan-Ganz catheter    Exercises     Assessment/Plan    PT Assessment Patient needs continued PT services  PT Problem List Decreased strength;Decreased activity tolerance;Decreased balance;Decreased mobility;Decreased knowledge of use of DME;Decreased knowledge of precautions;Decreased safety awareness;Cardiopulmonary status limiting activity       PT Treatment Interventions DME instruction;Gait training;Functional mobility training;Therapeutic activities;Therapeutic exercise;Balance training;Neuromuscular re-education;Patient/family education    PT Goals (Current goals can be found in the Care Plan section)  Acute Rehab PT Goals Patient Stated Goal: To improve mobility PT Goal Formulation: With patient Time For Goal Achievement: 10/31/19 Potential to Achieve Goals: Fair    Frequency Min 2X/week   Barriers to discharge        Co-evaluation               AM-PAC PT "6 Clicks" Mobility  Outcome Measure Help needed turning from your back to your side while in a flat bed without using bedrails?: A Lot Help needed moving from lying on your back to sitting on the side of a flat bed without using bedrails?: Total Help needed moving to and from a bed to a chair (including a wheelchair)?: Total Help needed standing up from a chair using your arms (e.g., wheelchair or bedside chair)?: Total Help needed to walk in hospital room?: Total Help needed climbing 3-5 steps with a railing? : Total 6 Click Score: 7    End of Session Equipment Utilized During Treatment: Oxygen Activity Tolerance: Patient limited by fatigue Patient left: in chair;with call bell/phone within reach;with nursing/sitter in room Nurse Communication: Mobility status PT Visit Diagnosis: Muscle weakness (generalized) (M62.81)    Time: 4970-2637 PT Time Calculation (min) (ACUTE ONLY): 24 min   Charges:   PT  Evaluation $PT Eval High Complexity: 1 High          Zenaida Niece, PT, DPT Acute Rehabilitation Pager: [phone]   Zenaida Niece 10/17/2019, 2:04 PM

## 2019-10-18 ENCOUNTER — Inpatient Hospital Stay: Payer: Self-pay

## 2019-10-18 LAB — BASIC METABOLIC PANEL
Anion gap: 13 (ref 5–15)
BUN: 59 mg/dL — ABNORMAL HIGH (ref 8–23)
CO2: 28 mmol/L (ref 22–32)
Calcium: 9.1 mg/dL (ref 8.9–10.3)
Chloride: 96 mmol/L — ABNORMAL LOW (ref 98–111)
Creatinine, Ser: 1.4 mg/dL — ABNORMAL HIGH (ref 0.44–1.00)
GFR calc Af Amer: 42 mL/min — ABNORMAL LOW (ref >60)
GFR calc non Af Amer: 37 mL/min — ABNORMAL LOW (ref >60)
Glucose, Bld: 150 mg/dL — ABNORMAL HIGH (ref 70–99)
Potassium: 3.7 mmol/L (ref 3.5–5.1)
Sodium: 137 mmol/L (ref 135–145)

## 2019-10-18 LAB — COOXEMETRY PANEL
Carboxyhemoglobin: 1.8 % — ABNORMAL HIGH (ref 0.5–1.5)
Methemoglobin: 1.3 % (ref 0.0–1.5)
O2 Saturation: 76.7 %
Total hemoglobin: 9.5 g/dL — ABNORMAL LOW (ref 12.0–16.0)

## 2019-10-18 LAB — GLUCOSE, CAPILLARY
Glucose-Capillary: 146 mg/dL — ABNORMAL HIGH (ref 70–99)
Glucose-Capillary: 149 mg/dL — ABNORMAL HIGH (ref 70–99)
Glucose-Capillary: 189 mg/dL — ABNORMAL HIGH (ref 70–99)
Glucose-Capillary: 178 mg/dL — ABNORMAL HIGH (ref 70–99)
Glucose-Capillary: 206 mg/dL — ABNORMAL HIGH (ref 70–99)
Glucose-Capillary: 235 mg/dL — ABNORMAL HIGH (ref 70–99)
Glucose-Capillary: 223 mg/dL — ABNORMAL HIGH (ref 70–99)

## 2019-10-18 LAB — CBC
HCT: 29.6 % — ABNORMAL LOW (ref 36.0–46.0)
Hemoglobin: 9.3 g/dL — ABNORMAL LOW (ref 12.0–15.0)
MCH: 31 pg (ref 26.0–34.0)
MCHC: 31.4 g/dL (ref 30.0–36.0)
MCV: 98.7 fL (ref 80.0–100.0)
Platelets: 123 10*3/uL — ABNORMAL LOW (ref 150–400)
RBC: 3 MIL/uL — ABNORMAL LOW (ref 3.87–5.11)
RDW: 15.2 % (ref 11.5–15.5)
WBC: 6.6 10*3/uL (ref 4.0–10.5)
nRBC: 0 % (ref 0.0–0.2)

## 2019-10-18 LAB — HEPARIN LEVEL (UNFRACTIONATED): Heparin Unfractionated: 0.55 IU/mL (ref 0.30–0.70)

## 2019-10-18 MED ORDER — POTASSIUM CHLORIDE CRYS ER 20 MEQ PO TBCR
40.0000 meq | EXTENDED_RELEASE_TABLET | Freq: Once | ORAL | Status: AC
  Administered 2019-10-18: 40 meq via ORAL
  Filled 2019-10-18: qty 2

## 2019-10-18 MED ORDER — SILDENAFIL CITRATE 20 MG PO TABS
40.0000 mg | ORAL_TABLET | Freq: Three times a day (TID) | ORAL | Status: DC
  Administered 2019-10-18 – 2019-10-25 (×21): 40 mg via ORAL
  Filled 2019-10-18 (×21): qty 2

## 2019-10-18 NOTE — Progress Notes (Signed)
ANTICOAGULATION CONSULT NOTE - Follow Up Consult  Pharmacy Consult for Heparin Indication: atrial fibrillation  Allergies  Allergen Reactions  . Lisinopril     R/t to kidney  . Codeine Nausea And Vomiting    Patient Measurements: Height: 5\' 5"  (165.1 cm) Weight: (!) 318 lb 2 oz (144.3 kg) IBW/kg (Calculated) : 57 Heparin dosing weight: 95.3 kg  Vital Signs: Temp: 97.3 F (36.3 C) (11/17 1100) Temp Source: Core (11/17 0800) BP: 124/51 (11/17 1100) Pulse Rate: 83 (11/17 1100)  Labs: Recent Labs    10/16/19 0339 10/16/19 0340 10/16/19 1721 10/17/19 0309 10/18/19 0358 10/18/19 0604  HGB 8.0*  --   --  8.0*  --  9.3*  HCT 26.3*  --   --  25.5*  --  29.6*  PLT 77*  --   --  88*  --  123*  APTT 61*  --  54* 77*  --   --   HEPARINUNFRC  --  0.57  --  0.57 0.55  --   CREATININE 1.73*  --   --  1.69* 1.40*  --     Estimated Creatinine Clearance: 50.4 mL/min (A) (by C-G formula based on SCr of 1.4 mg/dL (H)).   Assessment: 73 yo female transferred to [facility] on a heparin drip for atrial fibrillation.  Patient was on apixaban PTA, last dose unknown. Heparin was turned off briefly while in cath lab for swan placement. Pharmacy asked to restart heparin post cath.  Heparin level therapeutic at 0.55 on drip rate 1350 units/hr. No overt bleeding or complications noted. CBC low but improving.    Goal of Therapy:  Heparin level 0.3-0.7 units/ml Monitor platelets by anticoagulation protocol: Yes   Plan:  Continue IV heparin at current rate. Daily heparin level and CBC. F/u ability to resume oral anticoagulation eventually.  Richardine Service, PharmD PGY1 Pharmacy Resident Phone: [phone] 10/18/2019  12:59 PM  Please check [email or web address] for unit-specific pharmacy phone numbers.

## 2019-10-18 NOTE — Progress Notes (Signed)
Patient ID: MONTIA HASLIP, female   DOB: [birth year], 73 y.o.   MRN: [id number]    Advanced Heart Failure Rounding Note   Subjective:    [name] is a 73 y.o.femalewithwith morbid obesity, chronic diastolic HF, CKD 3, DM2, pulmonary hypertension, permanent atrial fibrillation, chronic hypoxic respiratory failure on home O2 who is transferred from [facility] 11/12 for further management of a/c diastolic HF, PAH and AKI. At outside hospital, she developed hypotension with attempts at diuresis w/ rising creatinine up to 3.2 (prior baseline 1.6). Lasix stopped and given IVF but BP remained low. NE started at 5 and transferred here.   [name] 10/13/19 w/ findings c/w moderate to severe PH w/ cor pulmonale, mildly elevated PCWP and moderate to severely reduced CO by Thermo.  RHC Hemodynamics  RA = 19 RV = 84/19 PA = 92/28 (48) PCW = 22 Fick cardiac output/index = 7.7/3.1 Thermo CO/CI = 4.6/1.9 PVR = 5.5 WU (thermo) Ao sat = 99% PA sat = 65%, 67%  Started on inotropic support w/ milrinone 0.125 mcg/kg/min + lasix gtt 20 mg/hr. Also getting metolazone bid. Norepinephrine discontinued 11/15. Remains on midodrine 10 tid for BP support. Sildenafil added 11/15 for PH. MAPs mid 70s, spironolactone added yesterday.   Good response to diuretics w/ additional 2.5L out yesterday, 6.9L off total.   Wt down 10 lb, from 330>>320 lb yesterday. Today's weight may not be accurate.   Creatinine continues to improve trending down, 2.08>>1.73>>1.69>1.4 today. Electrolytes stable. K 3.7.  She remains in rate-controlled atrial fibrillation, HR mid 80s   States she is feeling better, swelling in arms has decreased. No complaints.   Swan: CVP 11 PA 82/25 (50) PCWP 13 CI 2.7 CO 6.7 Co-ox 69%     Objective:   Weight Range:  Vital Signs:   Temp:  [97 F (36.1 C)-98.4 F (36.9 C)] 97.2 F (36.2 C) (11/17 0500) Pulse Rate:  [63-87] 78 (11/17 0500) Resp:  [13-33] 14 (11/17 0500) BP:  (89-134)/(37-62) 118/48 (11/17 0500) SpO2:  [92 %-100 %] 95 % (11/17 0500) Weight:  [115.1 kg] 115.1 kg (11/17 0338) Last BM Date: 10/11/19  Weight change: Filed Weights   10/16/19 0500 10/17/19 0448 10/18/19 0338  Weight: (!) 150 kg (!) 145.5 kg 115.1 kg    Intake/Output:   Intake/Output Summary (Last 24 hours) at 10/18/2019 0093 Last data filed at 10/18/2019 0500 Gross per 24 hour  Intake 1739.2 ml  Output 4425 ml  Net -2685.8 ml     Physical Exam: PHYSICAL EXAM: General:  Super morbidly obese WF. NAD, up in chair HEENT: normal Neck: supple. +JVD. [name] cath in RIG, Carotids 2+ bilat; no bruits.  Cor: PMI nondisplaced. irregularly irregular rhythm, regular rate, 2/6 TR murmur. Lungs: Distant breath sounds, Due to body habitus, no respiratory difficulty  Abdomen: obese, soft, nontender, nondistended. No hepatosplenomegaly. No bruits or masses. +BS Extremities: no cyanosis, clubbing, rash. markedly edematous bilateral lower extremities wrapped, UE edema significantly improved today Neuro: a&ox3, cranial nerves grossly intact. moves all 4 extremities w/o difficulty. pleasant  Telemetry: persistent atrial fibrillation, mid 80s Personally reviewed   Labs: Basic Metabolic Panel: Recent Labs  Lab 10/13/19 0018  10/14/19 0259 10/15/19 0410 10/16/19 0339 10/17/19 0309 10/18/19 0358  NA 136   < > 136 137 137 136 137  K 3.7   < > 3.8 3.9 4.1 4.0 3.7  CL 105   < > 101 103 101 100 96*  CO2 20*   < > 23 26  27 27 28   GLUCOSE 173*   < > 211* 172* 176* 157* 150*  BUN 47*   < > 50* 53* 54* 58* 59*  CREATININE 3.02*   < > 2.27* 2.08* 1.73* 1.69* 1.40*  CALCIUM 8.1*   < > 8.1* 8.2* 8.7* 8.6* 9.1  MG 2.0  --  1.9 2.0  --  2.0  --    < > = values in this interval not displayed.    Liver Function Tests: Recent Labs  Lab 10/13/19 0018  AST 8*  ALT 12  ALKPHOS 148*  BILITOT 0.4  PROT 5.5*  ALBUMIN 2.5*   No results for input(s): LIPASE, AMYLASE in the last 168 hours.  No results for input(s): AMMONIA in the last 168 hours.  CBC: Recent Labs  Lab 10/13/19 0018 10/13/19 0542  10/13/19 0816 10/14/19 0259 10/15/19 0410 10/16/19 0339 10/17/19 0309  WBC 6.5 6.8  --   --  6.4 6.0 5.8 5.5  NEUTROABS 4.7  --   --   --   --   --   --   --   HGB 9.4* 9.6*   < > 10.2* 8.5* 8.2* 8.0* 8.0*  HCT 30.1* 31.1*   < > 30.0* 27.5* 26.9* 26.3* 25.5*  MCV 98.7 100.0  --   --  100.0 100.4* 100.0 97.7  PLT 73* 75*  --   --  76* 76* 77* 88*   < > = values in this interval not displayed.    Cardiac Enzymes: No results for input(s): CKTOTAL, CKMB, CKMBINDEX, TROPONINI in the last 168 hours.  BNP: BNP (last 3 results) Recent Labs    12/20/18 1700 06/22/19 1452 10/13/19 0018  BNP 212.5* 187.0* 571.9*    ProBNP (last 3 results) No results for input(s): PROBNP in the last 8760 hours.    Other results:  Imaging: No results found.   Medications:     Scheduled Medications: . Chlorhexidine Gluconate Cloth  6 each Topical Daily  . insulin aspart  0-15 Units Subcutaneous Q4H  . mouth rinse  15 mL Mouth Rinse BID  . metolazone  5 mg Oral BID  . midodrine  10 mg Oral TID WC  . sildenafil  20 mg Oral TID  . sodium chloride flush  3 mL Intravenous Q12H  . spironolactone  12.5 mg Oral Daily    Infusions: . sodium chloride Stopped (10/16/19 1017)  . ferumoxytol Stopped (10/16/19 0959)  . furosemide (LASIX) infusion 20 mg/hr (10/18/19 0500)  . heparin 1,350 Units/hr (10/18/19 0500)  . milrinone 0.125 mcg/kg/min (10/18/19 0543)  . norepinephrine (LEVOPHED) Adult infusion Stopped (10/15/19 1820)    PRN Medications: sodium chloride, acetaminophen, ondansetron (ZOFRAN) IV, sodium chloride flush   Assessment/Plan:   1. PAH with cor pulmonale, end-stage - RHC numbers as above - Likely multifactorial WHO Group, I,II,III - marked R-sided volume overload complicated by cardiorenal syndrome - started milrinone and lasix gtt 11/12, + metolazone 5 mg bid,  +spiro 12.5 mg yesterday - Good UOP w/ additional 2.5L out yesterday, net negative 6.9L. Wt inaccurate today, reweighing. Creatinine continues to improve.  Still marked volume overload with CVP at 11  -Continue Lasix gtt 20 mg/hr and metolazone 5 mg bid today. Cont. Spironolactone. Continue bilateral unna boots - Continue sildenafil 20 mg tid  - MAP stable on milrinone 0.125, midodrine 10 mg tid.  - start to wean milrinone   2. Acute on chronic diastolic heart failure with R>L HF - Echo 7/20 EF 60-65% RV reportedly  normal with RVSP 85 - d/c weight in 7/20 was 127 kg. At admission 151.4 kg. Yesterday 145 kg - diuresis limited by hypotension and A/CKD - hypotension resolved - suspect significant component of cardiorenal syndrome, improving - Plan as above.   3. 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 SCr trending down from 2.91>>2.58>>2.27>>2.08>>1.73>>1.69 > 1.4 - Hold nephrotoxic agents - Support RV function with milrinone - start to wean in the next few days - Renal u/s with severe bilateral cortical thinning - continue to monitor w/ diuresis. May need CRRT, but currently diuresing better so will stay the course.   4. Hypotension - Improved with midodrine 10 mg tid - decreased to 5 mg tid today - Now off norepinephrine three days - MAPs in the mid 70s  5. Permanent AF - On eliquis and cardizem at home. - Heparin while here. Hold cardizem with low BP - current rates in the mid 80s. Monitor  - Monitor electrolytes and keep K and Mg stable while diuresing. Mg tomorrow.   6. Chronic hypoxic respiratory failure on home O2 - non-smoker. Suspect OSA /OHS/PAH - ABG at OSH  7.30/49/83/97% (only mild CO2 retention)   7 Severe obesity (BMI >= 40)  -Body mass index is 55.54 kg/m.  8. Type 2 diabetes mellitus with nephropathy - Reports taking trulicity and 2u insulin daily at home - A1c 7.9 - Low-carb diet. Cover with SSI   9.  Anemia of Chronic disease - consider Aranesp  10. Thrombocytopenia  - plt ct improving from 77K >>88K >> 123. On IV heparin - monitor closely, if significant drop check HIT panel   Overall prognosis continues to be tentative, although at this point she is improving.    Length of Stay: 6   Wendy A Seawell, DO  10/18/2019, 6:20 AM  Advanced Heart Failure Team Pager [phone] (M-F; [place])  Please contact [facility] for night-coverage after hours (4p -7a ) and weekends on [email or web address]  Agree with above.  Remains on milrinone for inotropic/RV support and to lower PA pressures. Remains on lasix gtt. Continues to diurese well. Weight coming down steadily. Breathing better. [name] numbers done personally at bedside with [name]. BP tolerating sildenafil. Remains in AF with controlled rate. On heparin. No bleeding.   General:  Obese woman sitting in chair . No resp difficulty HEENT: normal Neck: supple. RIJ swan + JVD Carotids 2+ bilat; no bruits. No lymphadenopathy or thryomegaly appreciated. Cor: PMI nondisplaced. Iegular rate & rhythm. No rubs, gallops or murmurs. Lungs: clear Abdomen: obese soft, nontender, nondistended. No hepatosplenomegaly. No bruits or masses. Good bowel sounds. Extremities: no cyanosis, clubbing, rash, 1-2+ edema + UNNA Neuro: alert & orientedx3, cranial nerves grossly intact. moves all 4 extremities w/o difficulty. Affect pleasant  Remains tenuous but improving. Continues to diurese. Renal function improving. Will continue milrinone and IV lasix. Can place PICC and pull swan today. Increase sildenafil to 40 tid. Can likely switch back to Eliquis soon. Refuses SNF but says [facility] ok (she has been there before).   CRITICAL CARE Performed by: Glori Bickers  Total critical care time: 35 minutes  Critical care time was exclusive of separately billable procedures and treating other patients.  Critical care was necessary to treat or prevent  imminent or life-threatening deterioration.  Critical care was time spent personally by me (independent of midlevel providers or residents) on the following activities: development of treatment plan with  patient and/or surrogate as well as nursing, discussions with consultants, evaluation of patient's response to treatment, examination of patient, obtaining history from patient or surrogate, ordering and performing treatments and interventions, ordering and review of laboratory studies, ordering and review of radiographic studies, pulse oximetry and re-evaluation of patient's condition.  Glori Bickers, MD  12:40 PM

## 2019-10-18 NOTE — Evaluation (Signed)
Occupational Therapy Evaluation Patient Details Name: [name] MRN: [id number] DOB: [birth year] Today's Date: 10/18/2019    History of Present Illness 73 y.o. female with morbid obesity, chronic diastolic HF, CKD 3, DM2, pulmonary hypertension, permanent atrial fibrillation, chronic hypoxic respiratory failure on home O2 who is transferred from [facility] 11/12 for further management of a/c diastolic HF, PAH and AKI.   Clinical Impression   PT admitted with CHF with chronic hypoxic respiratory failure. Pt currently with functional limitiations due to the deficits listed below (see OT problem list). Pt reports x2 oxygen machines at her house from 2012 that are not working properly. 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 Pt has assistance from a daughter and granddaughter PRN. Pt expresses need to return home with help for the children. Pt disclosed that the children were left in her care and possible children's father to assist with them during her hospitalization but uncertain Pt will benefit from skilled OT to increase their independence and safety with adls and balance to allow discharge SNF.     Follow Up Recommendations  SNF    Equipment Recommendations  3 in 1 bedside commode;Wheelchair (measurements OT);Wheelchair cushion (measurements OT);Hospital bed    Recommendations for Other Services Other (comment)(SW / Cm to help with home situation involving care of kids)     Precautions / Restrictions Precautions Precautions: Fall Precaution Comments: oxygen required Restrictions Weight Bearing Restrictions: No      Mobility Bed Mobility               General bed mobility comments: oob in chair  Transfers Overall transfer level: Needs assistance   Transfers: Sit to/from Stand Sit to Stand: +2 physical assistance;Max assist         General transfer comment: pt requires x2 attempts to complete sit<.stand form chair  height. pt static standing for > 4 minutes and reaching outside base of support    Balance Overall balance assessment: Needs assistance Sitting-balance support: Bilateral upper extremity supported;Feet supported Sitting balance-Leahy Scale: Fair     Standing balance support: Bilateral upper extremity supported Standing balance-Leahy Scale: Poor Standing balance comment: minA x2 for static standing balance                           ADL either performed or assessed with clinical judgement   ADL Overall ADL's : Needs assistance/impaired Eating/Feeding: Minimal assistance;Sitting   Grooming: Minimal assistance;Sitting;Oral care   Upper Body Bathing: Moderate assistance   Lower Body Bathing: Total assistance                         General ADL Comments: pt completed sit<>stand in stedy this session     Vision Baseline Vision/History: Wears glasses Wears Glasses: At all times       Perception     Praxis      Pertinent Vitals/Pain Pain Assessment: No/denies pain     Hand Dominance Right   Extremity/Trunk Assessment Upper Extremity Assessment Upper Extremity Assessment: Generalized weakness   Lower Extremity Assessment Lower Extremity Assessment: RLE deficits/detail;LLE deficits/detail RLE Deficits / Details: edema noted LLE Deficits / Details: edema noted   Cervical / Trunk Assessment Cervical / Trunk Assessment: Normal   Communication Communication Communication: No difficulties   Cognition Arousal/Alertness: Awake/alert Behavior During Therapy: WFL for tasks assessed/performed Overall Cognitive Status: Within Functional Limits for tasks assessed  General Comments  VSS requires 5 L oxygen this session     Exercises     Shoulder Instructions      Home Living Family/patient expects to be discharged to:: Private residence Living Arrangements: Children Available Help at Discharge:  Family;Available 24 hours/day;Friend(s) Type of Home: House Home Access: Level entry     Home Layout: One level     Bathroom Shower/Tub: Occupational psychologist: Standard     Home Equipment: Other (comment);Walker - 4 wheels;Wheelchair - manual;Shower seat - built in;Toilet riser;Walker - 2 wheels   Additional Comments: 3 wheel walker      Prior Functioning/Environment Level of Independence: Independent with assistive device(s)        Comments: household ambulator with 3 wheeled walker- lives with granddaughter the granddaughters boyfriend two small children 64 month old and 47 year old. The patient reports that the mother brought the children to her home for help and she is raising them. the patient states they are "foster children" but they are not actually in foster care.         OT Problem List: Decreased strength;Decreased activity tolerance;Impaired balance (sitting and/or standing);Decreased safety awareness;Decreased knowledge of use of DME or AE;Decreased knowledge of precautions;Obesity      OT Treatment/Interventions: Self-care/ADL training;Therapeutic exercise;Neuromuscular education;Energy conservation;DME and/or AE instruction;Manual therapy;Modalities;Therapeutic activities;Cognitive remediation/compensation;Patient/family education;Balance training    OT Goals(Current goals can be found in the care plan section) Acute Rehab OT Goals Patient Stated Goal: To get back to my babies OT Goal Formulation: With patient Time For Goal Achievement: 11/01/19 Potential to Achieve Goals: Good  OT Frequency: Min 2X/week   Barriers to D/C: Decreased caregiver support          Co-evaluation              AM-PAC OT "6 Clicks" Daily Activity     Outcome Measure Help from another person eating meals?: A Little Help from another person taking care of personal grooming?: A Little Help from another person toileting, which includes using toliet, bedpan, or  urinal?: Total Help from another person bathing (including washing, rinsing, drying)?: Total Help from another person to put on and taking off regular upper body clothing?: A Lot Help from another person to put on and taking off regular lower body clothing?: Total 6 Click Score: 11   End of Session Equipment Utilized During Treatment: Gait belt;Oxygen Nurse Communication: Mobility status;Precautions  Activity Tolerance: Patient tolerated treatment well Patient left: in chair;with call bell/phone within reach;with chair alarm set  OT Visit Diagnosis: Unsteadiness on feet (R26.81);Muscle weakness (generalized) (M62.81)                Time: 4709-6283 OT Time Calculation (min): 28 min Charges:  OT General Charges $OT Visit: 1 Visit OT Evaluation $OT Eval Moderate Complexity: 1 Mod   Brynn, OTR/L  Acute Rehabilitation Services Pager: [phone] Office: [phone] .   Jeri Modena 10/18/2019, 2:49 PM

## 2019-10-18 NOTE — Progress Notes (Signed)
Spoke to primary RN ,PICC placement will done tom. 10/19/19.

## 2019-10-18 NOTE — Progress Notes (Signed)
Chaplain engaged in initial visit with [name].  [name] shared the difficult family dynamics that she has been dealing with concerning her two foster children, her daughter, and granddaughter.  [name] cares deeply for her foster children, but her granddaughter and daughter do not wish to raise her foster children while she is in recovery.  [name] has elicited the help of a friend to care for her foster children at least three days out of the week.  She described this as the way to help in taking care of the kids while she is in recovery.  She also expressed that she would not know what to do if she did not have those kids.  Chaplain worked to help [name] make decisions regarding her own recovery and health, as well as the well-being of the children.  [name] seems to have made up in her mind that the foster children will be staying with her family and will be taken care of by her friend until she recovers.  She also knows that going to a facility to recover is the best option for her right now despite her families efforts to get her home immediately.  Chaplain and [name] prayed.  Chaplain tried to offer [name] some peace and comfort, and alleviate the stress she is feeling from external sources outside of the hospital.  Chaplain will follow-up as needed.

## 2019-10-18 NOTE — Plan of Care (Signed)
MD Bensimhon notified of IV team placement of PICC line in the AM. MD agree to plan of switching out Swann-Ganz and Introducer with PICC placement tomorrow. Will continue to monitor and assess.

## 2019-10-19 ENCOUNTER — Telehealth (HOSPITAL_COMMUNITY): Payer: Self-pay | Admitting: Pharmacy Technician

## 2019-10-19 ENCOUNTER — Telehealth (HOSPITAL_COMMUNITY): Payer: Self-pay | Admitting: Pharmacist

## 2019-10-19 ENCOUNTER — Inpatient Hospital Stay (HOSPITAL_COMMUNITY): Payer: Medicare HMO

## 2019-10-19 DIAGNOSIS — I4821 Permanent atrial fibrillation: Secondary | ICD-10-CM

## 2019-10-19 LAB — COOXEMETRY PANEL
Carboxyhemoglobin: 1.8 % — ABNORMAL HIGH (ref 0.5–1.5)
Methemoglobin: 1.1 % (ref 0.0–1.5)
O2 Saturation: 64.5 %
Total hemoglobin: 9.8 g/dL — ABNORMAL LOW (ref 12.0–16.0)

## 2019-10-19 LAB — GLUCOSE, CAPILLARY
Glucose-Capillary: 148 mg/dL — ABNORMAL HIGH (ref 70–99)
Glucose-Capillary: 180 mg/dL — ABNORMAL HIGH (ref 70–99)
Glucose-Capillary: 143 mg/dL — ABNORMAL HIGH (ref 70–99)
Glucose-Capillary: 156 mg/dL — ABNORMAL HIGH (ref 70–99)
Glucose-Capillary: 186 mg/dL — ABNORMAL HIGH (ref 70–99)
Glucose-Capillary: 258 mg/dL — ABNORMAL HIGH (ref 70–99)

## 2019-10-19 LAB — MAGNESIUM: Magnesium: 1.7 mg/dL (ref 1.7–2.4)

## 2019-10-19 LAB — BASIC METABOLIC PANEL
Anion gap: 13 (ref 5–15)
BUN: 57 mg/dL — ABNORMAL HIGH (ref 8–23)
CO2: 30 mmol/L (ref 22–32)
Calcium: 9.1 mg/dL (ref 8.9–10.3)
Chloride: 95 mmol/L — ABNORMAL LOW (ref 98–111)
Creatinine, Ser: 1.3 mg/dL — ABNORMAL HIGH (ref 0.44–1.00)
GFR calc Af Amer: 46 mL/min — ABNORMAL LOW (ref >60)
GFR calc non Af Amer: 40 mL/min — ABNORMAL LOW (ref >60)
Glucose, Bld: 147 mg/dL — ABNORMAL HIGH (ref 70–99)
Potassium: 4 mmol/L (ref 3.5–5.1)
Sodium: 138 mmol/L (ref 135–145)

## 2019-10-19 LAB — CBC
HCT: 28.4 % — ABNORMAL LOW (ref 36.0–46.0)
Hemoglobin: 8.9 g/dL — ABNORMAL LOW (ref 12.0–15.0)
MCH: 30.6 pg (ref 26.0–34.0)
MCHC: 31.3 g/dL (ref 30.0–36.0)
MCV: 97.6 fL (ref 80.0–100.0)
Platelets: 135 10*3/uL — ABNORMAL LOW (ref 150–400)
RBC: 2.91 MIL/uL — ABNORMAL LOW (ref 3.87–5.11)
RDW: 15 % (ref 11.5–15.5)
WBC: 7.4 10*3/uL (ref 4.0–10.5)
nRBC: 0 % (ref 0.0–0.2)

## 2019-10-19 LAB — HEPARIN LEVEL (UNFRACTIONATED): Heparin Unfractionated: 0.41 IU/mL (ref 0.30–0.70)

## 2019-10-19 MED ORDER — MAGNESIUM SULFATE 4 GM/100ML IV SOLN
4.0000 g | Freq: Once | INTRAVENOUS | Status: AC
  Administered 2019-10-19: 4 g via INTRAVENOUS
  Filled 2019-10-19: qty 100

## 2019-10-19 MED ORDER — SODIUM CHLORIDE 0.9% FLUSH
10.0000 mL | Freq: Two times a day (BID) | INTRAVENOUS | Status: DC
  Administered 2019-10-19 – 2019-10-24 (×10): 10 mL

## 2019-10-19 MED ORDER — SODIUM CHLORIDE 0.9% FLUSH: 10.0000 mL | INTRAVENOUS | Status: DC | PRN

## 2019-10-19 NOTE — Progress Notes (Signed)
Physical Therapy Treatment Patient Details Name: [name] MRN: [id number] DOB: 1946-08-03 Today's Date: 10/19/2019    History of Present Illness 72 y.o. female with morbid obesity, chronic diastolic HF, CKD 3, DM2, pulmonary hypertension, permanent atrial fibrillation, chronic hypoxic respiratory failure on home O2 who is transferred from [facility] 11/12 for further management of a/c diastolic HF, PAH and AKI.    PT Comments    Pt tolerated treatment well despite reports of fatigue. Pt continues to require significant assistance for bed mobility, but demonstrates improved transfer quality and is able to initiate gait training at bedside. Pt remains weak and quickly fatigues with mobility, demonstrating increased work of breathing and desaturation while on 6L [place]. Pt will benefit from continued acute PT services to reduce falls risk and caregiver burden while improving functional mobility.  Follow Up Recommendations  SNF     Equipment Recommendations  None recommended by PT    Recommendations for Other Services       Precautions / Restrictions Precautions Precautions: Fall Precaution Comments: oxygen required Restrictions Weight Bearing Restrictions: No    Mobility  Bed Mobility Overal bed mobility: Needs Assistance Bed Mobility: Supine to Sit;Sit to Supine;Rolling Rolling: Min assist   Supine to sit: Max assist;HOB elevated Sit to supine: Max assist      Transfers Overall transfer level: Needs assistance Equipment used: Rolling walker (2 wheeled) Transfers: Sit to/from Stand Sit to Stand: Mod assist            Ambulation/Gait Ambulation/Gait assistance: Min assist Gait Distance (Feet): 3 Feet Assistive device: Rolling walker (2 wheeled) Gait Pattern/deviations: Step-to pattern;Shuffle Gait velocity: reduced Gait velocity interpretation: <1.31 ft/sec, indicative of household ambulator General Gait Details: short side steps to left side toward  head of bed   Stairs             Wheelchair Mobility    Modified Rankin (Stroke Patients Only)       Balance Overall balance assessment: Needs assistance Sitting-balance support: Bilateral upper extremity supported;Feet supported Sitting balance-Leahy Scale: Good Sitting balance - Comments: close supervision   Standing balance support: Bilateral upper extremity supported Standing balance-Leahy Scale: Fair Standing balance comment: minG with BUE support of RW                            Cognition Arousal/Alertness: Awake/alert Behavior During Therapy: WFL for tasks assessed/performed Overall Cognitive Status: Within Functional Limits for tasks assessed                                        Exercises      General Comments General comments (skin integrity, edema, etc.): Pt O2 saturation ranging from 86-94% during session on 6L [place]. PT educating pt on slow pursed lip breathing to improve oxygen saturation      Pertinent Vitals/Pain Pain Assessment: Faces Faces Pain Scale: Hurts even more Pain Location: R knee Pain Descriptors / Indicators: Aching Pain Intervention(s): Limited activity within patient's tolerance    Home Living                      Prior Function            PT Goals (current goals can now be found in the care plan section) Acute Rehab PT Goals Patient Stated Goal: To get back to my babies Progress  towards PT goals: Progressing toward goals    Frequency    Min 2X/week      PT Plan Current plan remains appropriate    Co-evaluation              AM-PAC PT "6 Clicks" Mobility   Outcome Measure  Help needed turning from your back to your side while in a flat bed without using bedrails?: A Lot Help needed moving from lying on your back to sitting on the side of a flat bed without using bedrails?: Total Help needed moving to and from a bed to a chair (including a wheelchair)?: A Lot Help needed  standing up from a chair using your arms (e.g., wheelchair or bedside chair)?: A Lot Help needed to walk in hospital room?: A Little Help needed climbing 3-5 steps with a railing? : Total 6 Click Score: 11    End of Session Equipment Utilized During Treatment: Gait belt;Oxygen Activity Tolerance: Patient tolerated treatment well Patient left: in bed;with call bell/phone within reach Nurse Communication: Mobility status PT Visit Diagnosis: Muscle weakness (generalized) (M62.81)     Time: 2035-5974 PT Time Calculation (min) (ACUTE ONLY): 28 min  Charges:  $Therapeutic Activity: 23-37 mins                     Zenaida Niece, PT, DPT Acute Rehabilitation Pager: [phone]    Zenaida Niece 10/19/2019, 3:57 PM

## 2019-10-19 NOTE — Plan of Care (Signed)
°  Problem: Education: °Goal: Knowledge of General Education information will improve °Description: Including pain rating scale, medication(s)/side effects and non-pharmacologic comfort measures °Outcome: Progressing °  °Problem: Health Behavior/Discharge Planning: °Goal: Ability to manage health-related needs will improve °Outcome: Progressing °  °Problem: Clinical Measurements: °Goal: Ability to maintain clinical measurements within normal limits will improve °Outcome: Progressing °Goal: Will remain free from infection °Outcome: Progressing °Goal: Diagnostic test results will improve °Outcome: Progressing °Goal: Respiratory complications will improve °Outcome: Progressing °Goal: Cardiovascular complication will be avoided °Outcome: Progressing °  °Problem: Activity: °Goal: Risk for activity intolerance will decrease °Outcome: Progressing °  °Problem: Nutrition: °Goal: Adequate nutrition will be maintained °Outcome: Progressing °  °Problem: Coping: °Goal: Level of anxiety will decrease °Outcome: Progressing °  °Problem: Elimination: °Goal: Will not experience complications related to bowel motility °Outcome: Progressing °Goal: Will not experience complications related to urinary retention °Outcome: Progressing °  °Problem: Pain Managment: °Goal: General experience of comfort will improve °Outcome: Progressing °  °Problem: Safety: °Goal: Ability to remain free from injury will improve °Outcome: Progressing °  °Problem: Skin Integrity: °Goal: Risk for impaired skin integrity will decrease °Outcome: Progressing °  °Problem: Education: °Goal: Ability to demonstrate management of disease process will improve °Outcome: Progressing °Goal: Ability to verbalize understanding of medication therapies will improve °Outcome: Progressing °Goal: Individualized Educational Video(s) °Outcome: Progressing °  °Problem: Activity: °Goal: Capacity to carry out activities will improve °Outcome: Progressing °  °Problem: Cardiac: °Goal:  Ability to achieve and maintain adequate cardiopulmonary perfusion will improve °Outcome: Progressing °  °Problem: Education: °Goal: Knowledge of the prescribed therapeutic regimen will improve °Outcome: Progressing °  °Problem: Activity: °Goal: Risk for activity intolerance will decrease °Outcome: Progressing °  °Problem: Cardiac: °Goal: Ability to maintain an adequate cardiac output will improve °Outcome: Progressing °  °Problem: Coping: °Goal: Level of anxiety will decrease °Outcome: Progressing °  °Problem: Fluid Volume: °Goal: Risk for excess fluid volume will decrease °Outcome: Progressing °  °Problem: Clinical Measurements: °Goal: Ability to maintain clinical measurements within normal limits will improve °Outcome: Progressing °Goal: Will remain free from infection °Outcome: Progressing °  °Problem: Respiratory: °Goal: Will regain and/or maintain adequate ventilation °Outcome: Progressing °  °

## 2019-10-19 NOTE — Telephone Encounter (Signed)
Patient Advocate Encounter   Received notification from [facility] that prior authorization for Sildenafil is required.   PA submitted on CoverMyMeds Key [id number] Status is pending   Will continue to follow.  Charlann Boxer, CPhT

## 2019-10-19 NOTE — Progress Notes (Signed)
ANTICOAGULATION CONSULT NOTE - Follow Up Consult  Pharmacy Consult for Heparin Indication: atrial fibrillation  Allergies  Allergen Reactions  . Lisinopril     R/t to kidney  . Codeine Nausea And Vomiting    Patient Measurements: Height: 5\' 5"  (165.1 cm) Weight: (!) 318 lb 2 oz (144.3 kg) IBW/kg (Calculated) : 57 Heparin dosing weight: 95.3 kg  Vital Signs: Temp: 98.4 F (36.9 C) (11/18 0504) Temp Source: Core (11/18 0400) BP: 102/49 (11/18 0504) Pulse Rate: 89 (11/18 0504)  Labs: Recent Labs    10/16/19 1721  10/17/19 0309 10/18/19 0358 10/18/19 0604 10/19/19 0405  HGB  --    < > 8.0*  --  9.3* 8.9*  HCT  --   --  25.5*  --  29.6* 28.4*  PLT  --   --  88*  --  123* 135*  APTT 54*  --  77*  --   --   --   HEPARINUNFRC  --   --  0.57 0.55  --  0.41  CREATININE  --   --  1.69* 1.40*  --   --    < > = values in this interval not displayed.    Estimated Creatinine Clearance: 50.4 mL/min (A) (by C-G formula based on SCr of 1.4 mg/dL (H)).   Assessment: 73 yo female transferred to [facility] on a heparin drip for atrial fibrillation.  Patient was on apixaban PTA, last dose unknown. Heparin was turned off briefly while in cath lab for swan placement. Pharmacy asked to restart heparin post cath.  Heparin level therapeutic at 0.41 on drip rate 1350 units/hr. No overt bleeding or complications noted. CBC low but stable.    Goal of Therapy:  Heparin level 0.3-0.7 units/ml Monitor platelets by anticoagulation protocol: Yes   Plan:  Continue IV heparin at 1350 units/hr Daily heparin level and CBC. F/u ability to resume oral anticoagulation eventually.  Richardine Service, PharmD PGY1 Pharmacy Resident Phone: [phone] 10/19/2019  6:09 AM  Please check [email or web address] for unit-specific pharmacy phone numbers.

## 2019-10-19 NOTE — Telephone Encounter (Signed)
Patient Advocate Encounter   Received notification from [facility] that prior authorization for Opsumit is required.   PA submitted on CoverMyMeds Key [id number] Status is pending   Will continue to follow.  Opsumit Patient Enrollment form and [place] form filled out. Will have inpatient pharmacist get patient to sign forms. Will submit forms to [employer] once signed.  Audry Riles, PharmD, BCPS, CPP Heart Failure Clinic Pharmacist [phone]

## 2019-10-19 NOTE — Progress Notes (Addendum)
Patient ID: [name], female   DOB: [birth year], 73 y.o.   MRN: [id number]    Advanced Heart Failure Rounding Note   Subjective:    She remains on milrinone 0.125, IV lasix at 20 and metolazone. Weight down another 5 pounds - 23 pounds total.  Breathing feels better. Less bloated. Mild ab pain. No n/v. Co-ox 65%. Creatinine 1.3.   Swan #s reviewed personally   CVP 10 PA 80/27  Objective:   Weight Range:  Vital Signs:   Temp:  [97.2 F (36.2 C)-98.4 F (36.9 C)] 98.2 F (36.8 C) (11/18 0700) Pulse Rate:  [75-112] 95 (11/18 0700) Resp:  [14-32] 18 (11/18 0700) BP: (96-140)/(33-94) 127/44 (11/18 0700) SpO2:  [88 %-100 %] 93 % (11/18 0700) Weight:  [142 kg] 142 kg (11/18 0600) Last BM Date: 10/11/19  Weight change: Filed Weights   10/18/19 0338 10/18/19 0740 10/19/19 0600  Weight: 115.1 kg (!) 144.3 kg (!) 142 kg    Intake/Output:   Intake/Output Summary (Last 24 hours) at 10/19/2019 0751 Last data filed at 10/19/2019 0700 Gross per 24 hour  Intake 1438.64 ml  Output 4630 ml  Net -3191.36 ml     Physical Exam: General:  Sitting in bed No resp difficulty HEENT: normal Neck: supple. JVP 10  RIJ swan Carotids 2+ bilat; no bruits. No lymphadenopathy or thryomegaly appreciated. Cor: PMI nondisplaced. Regular rate & rhythm. No rubs, gallops or murmurs. Lungs: clear Abdomen: obese soft, nontender, nondistended. No hepatosplenomegaly. No bruits or masses. Good bowel sounds. Extremities: no cyanosis, clubbing, rash, 1-2+ edema + UNNA Neuro: alert & orientedx3, cranial nerves grossly intact. moves all 4 extremities w/o difficulty. Affect pleasant  Telemetry: persistent atrial fibrillation, mid 90s Personally reviewed   Labs: Basic Metabolic Panel: Recent Labs  Lab 10/13/19 0018  10/14/19 0259 10/15/19 0410 10/16/19 0339 10/17/19 0309 10/18/19 0358 10/19/19 0405  NA 136   < > 136 137 137 136 137 138  K 3.7   < > 3.8 3.9 4.1 4.0 3.7 4.0  CL 105   < >  101 103 101 100 96* 95*  CO2 20*   < > 23 26 27 27 28 30   GLUCOSE 173*   < > 211* 172* 176* 157* 150* 147*  BUN 47*   < > 50* 53* 54* 58* 59* 57*  CREATININE 3.02*   < > 2.27* 2.08* 1.73* 1.69* 1.40* 1.30*  CALCIUM 8.1*   < > 8.1* 8.2* 8.7* 8.6* 9.1 9.1  MG 2.0  --  1.9 2.0  --  2.0  --  1.7   < > = values in this interval not displayed.    Liver Function Tests: Recent Labs  Lab 10/13/19 0018  AST 8*  ALT 12  ALKPHOS 148*  BILITOT 0.4  PROT 5.5*  ALBUMIN 2.5*   No results for input(s): LIPASE, AMYLASE in the last 168 hours. No results for input(s): AMMONIA in the last 168 hours.  CBC: Recent Labs  Lab 10/13/19 0018  10/15/19 0410 10/16/19 0339 10/17/19 0309 10/18/19 0604 10/19/19 0405  WBC 6.5   < > 6.0 5.8 5.5 6.6 7.4  NEUTROABS 4.7  --   --   --   --   --   --   HGB 9.4*   < > 8.2* 8.0* 8.0* 9.3* 8.9*  HCT 30.1*   < > 26.9* 26.3* 25.5* 29.6* 28.4*  MCV 98.7   < > 100.4* 100.0 97.7 98.7 97.6  PLT 73*   < >  76* 77* 88* 123* 135*   < > = values in this interval not displayed.    Cardiac Enzymes: No results for input(s): CKTOTAL, CKMB, CKMBINDEX, TROPONINI in the last 168 hours.  BNP: BNP (last 3 results) Recent Labs    12/20/18 1700 06/22/19 1452 10/13/19 0018  BNP 212.5* 187.0* 571.9*    ProBNP (last 3 results) No results for input(s): PROBNP in the last 8760 hours.    Other results:  Imaging: [place] Ekg Site Rite  Result Date: 10/18/2019 If Site Rite image not attached, placement could not be confirmed due to current cardiac rhythm.    Medications:     Scheduled Medications: . Chlorhexidine Gluconate Cloth  6 each Topical Daily  . insulin aspart  0-15 Units Subcutaneous Q4H  . mouth rinse  15 mL Mouth Rinse BID  . metolazone  5 mg Oral BID  . midodrine  10 mg Oral TID WC  . sildenafil  40 mg Oral TID  . sodium chloride flush  10-40 mL Intracatheter Q12H  . sodium chloride flush  3 mL Intravenous Q12H  . spironolactone  12.5 mg Oral Daily     Infusions: . sodium chloride Stopped (10/16/19 1017)  . ferumoxytol Stopped (10/16/19 0959)  . furosemide (LASIX) infusion 20 mg/hr (10/19/19 0700)  . heparin 1,350 Units/hr (10/19/19 0700)  . milrinone 0.125 mcg/kg/min (10/19/19 0700)  . norepinephrine (LEVOPHED) Adult infusion Stopped (10/15/19 1820)    PRN Medications: sodium chloride, acetaminophen, ondansetron (ZOFRAN) IV, sodium chloride flush, sodium chloride flush   Assessment/Plan:   1. PAH with cor pulmonale, end-stage - RHC numbers as above - Likely multifactorial WHO Group, I,II,III - marked R-sided volume overload complicated by cardiorenal syndrome - started milrinone and lasix gtt 11/12, + metolazone 5 mg bid, +spiro 12.5 mg yesterday - co-ox 65% on milrinone 0.125 - Continues with good UOP. Still volume overloaded with CVP at 10. Creatinine stable - Will continue milrinone for RV support and PAH. Continue Lasix gtt 20 mg/hr and metolazone 5 mg bid today. May stop tomorrow - Cont. Spironolactone. Continue bilateral unna boots - Continue sildenafil 40 mg tid  - Add macitentan 10 daily Discussed with PharmD personally. - Continue midodrine 10 mg tid.  - Remove swan. Place PICC  2. Acute on chronic diastolic heart failure with R>L HF - Echo 7/20 EF 60-65% RV reportedly normal with RVSP 85 - d/c weight in 7/20 was 127 kg. At admission 151.4 kg. Yesterday 145 kg - diuresis limited by hypotension and A/CKD - hypotension resolved - suspect significant component of cardiorenal syndrome, improving - Plan as above. Continue IV diuresis one more day  3. 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 SCr trending down from 2.91>>2.58>>2.27>>2.08>>1.73>>1.69 > 1.4 -> 1.3 - Hold nephrotoxic agents - Support RV function with milrinone - Renal u/s with severe bilateral cortical thinning - Likely not candidate for HD due to severe PAH   4. Hypotension - Improved with midodrine  10 mg tid. May be able to wean once off lasix - MAPs stable in the mid 70s  5. Permanent AF - On eliquis and cardizem at home. - Heparin while here. Hold cardizem with low BP - current ratemid 70- 80s. Monitor  - Monitor electrolytes and keep K and Mg stable while diuresing. Mg tomorrow.   6. Chronic hypoxic respiratory failure on home O2 - non-smoker. Suspect OSA /OHS/PAH - ABG at OSH  7.30/49/83/97% (only mild CO2 retention)  7 Severe obesity (BMI >= 40)  -Body mass index is 55.54 kg/m.  8. Type 2 diabetes mellitus with nephropathy - Reports taking trulicity and 2u insulin daily at home - A1c 7.9 - Low-carb diet. Cover with SSI   9. Anemia of Chronic disease - consider Aranesp  10. Thrombocytopenia  - plt ct improving from 77K >>88K >> 123 > 135. On IV heparin  11. Debility - PT recommending SNF. She refuses but would consider [facility] tenuous but improving with inotropic support. Will continue IV lasix and milrinone today. Continue sildenafil. Add macitentan. Pull swan  CRITICAL CARE Performed by: Glori Bickers  Total critical care time: 35 minutes  Critical care time was exclusive of separately billable procedures and treating other patients.  Critical care was necessary to treat or prevent imminent or life-threatening deterioration.  Critical care was time spent personally by me (independent of midlevel providers or residents) on the following activities: development of treatment plan with patient and/or surrogate as well as nursing, discussions with consultants, evaluation of patient's response to treatment, examination of patient, obtaining history from patient or surrogate, ordering and performing treatments and interventions, ordering and review of laboratory studies, ordering and review of radiographic studies, pulse oximetry and re-evaluation of patient's condition.    Length of Stay: [place], MD 10/19/2019, 7:51 AM   Advanced Heart Failure Team Pager [phone] (M-F; 7a - 4p)  Please contact [facility] for night-coverage after hours (4p -7a ) and weekends on [email or web address]

## 2019-10-19 NOTE — Progress Notes (Signed)
Peripherally Inserted Central Catheter/Midline Placement  The IV Nurse has discussed with the patient and/or persons authorized to consent for the patient, the purpose of this procedure and the potential benefits and risks involved with this procedure.  The benefits include less needle sticks, lab draws from the catheter, and the patient may be discharged home with the catheter. Risks include, but not limited to, infection, bleeding, blood clot (thrombus formation), and puncture of an artery; nerve damage and irregular heartbeat and possibility to perform a PICC exchange if needed/ordered by physician.  Alternatives to this procedure were also discussed.  Bard Power PICC patient education guide, fact sheet on infection prevention and patient information card has been provided to patient /or left at bedside.    PICC/Midline Placement Documentation  PICC Triple Lumen 14/60/47 PICC Right Basilic 43 cm (Active)  Indication for Insertion or Continuance of Line Prolonged intravenous therapies 10/19/19 0900  Exposed Catheter (cm) 0 cm 10/19/19 0900  Site Assessment Clean;Dry;Intact 10/19/19 0900  Lumen #1 Status Flushed;Saline locked;Blood return noted 10/19/19 0900  Lumen #2 Status Flushed;Saline locked;Blood return noted 10/19/19 0900  Lumen #3 Status Flushed;Saline locked;Blood return noted 10/19/19 0900  Dressing Type Transparent;Securing device 10/19/19 0900  Dressing Status Clean;Dry;Intact;Antimicrobial disc in place 10/19/19 0900  Line Care Connections checked and tightened 10/19/19 0900  Dressing Intervention New dressing;Other (Comment) 10/19/19 0900  Dressing Change Due 10/26/19 10/19/19 0900    Patient gave written consent   [name] 10/19/2019, 9:14 AM

## 2019-10-20 LAB — GLUCOSE, CAPILLARY
Glucose-Capillary: 132 mg/dL — ABNORMAL HIGH (ref 70–99)
Glucose-Capillary: 141 mg/dL — ABNORMAL HIGH (ref 70–99)
Glucose-Capillary: 155 mg/dL — ABNORMAL HIGH (ref 70–99)
Glucose-Capillary: 156 mg/dL — ABNORMAL HIGH (ref 70–99)
Glucose-Capillary: 167 mg/dL — ABNORMAL HIGH (ref 70–99)
Glucose-Capillary: 182 mg/dL — ABNORMAL HIGH (ref 70–99)

## 2019-10-20 LAB — CBC
HCT: 25.4 % — ABNORMAL LOW (ref 36.0–46.0)
Hemoglobin: 7.7 g/dL — ABNORMAL LOW (ref 12.0–15.0)
MCH: 30.4 pg (ref 26.0–34.0)
MCHC: 30.3 g/dL (ref 30.0–36.0)
MCV: 100.4 fL — ABNORMAL HIGH (ref 80.0–100.0)
Platelets: 142 10*3/uL — ABNORMAL LOW (ref 150–400)
RBC: 2.53 MIL/uL — ABNORMAL LOW (ref 3.87–5.11)
RDW: 15.6 % — ABNORMAL HIGH (ref 11.5–15.5)
WBC: 6.4 10*3/uL (ref 4.0–10.5)
nRBC: 0 % (ref 0.0–0.2)

## 2019-10-20 LAB — HEPARIN LEVEL (UNFRACTIONATED): Heparin Unfractionated: 0.38 IU/mL (ref 0.30–0.70)

## 2019-10-20 LAB — BASIC METABOLIC PANEL
Anion gap: 11 (ref 5–15)
BUN: 52 mg/dL — ABNORMAL HIGH (ref 8–23)
CO2: 31 mmol/L (ref 22–32)
Calcium: 8.7 mg/dL — ABNORMAL LOW (ref 8.9–10.3)
Chloride: 89 mmol/L — ABNORMAL LOW (ref 98–111)
Creatinine, Ser: 1.36 mg/dL — ABNORMAL HIGH (ref 0.44–1.00)
GFR calc Af Amer: 44 mL/min — ABNORMAL LOW (ref >60)
GFR calc non Af Amer: 38 mL/min — ABNORMAL LOW (ref >60)
Glucose, Bld: 283 mg/dL — ABNORMAL HIGH (ref 70–99)
Potassium: 3.9 mmol/L (ref 3.5–5.1)
Sodium: 131 mmol/L — ABNORMAL LOW (ref 135–145)

## 2019-10-20 LAB — COOXEMETRY PANEL
Carboxyhemoglobin: 2 % — ABNORMAL HIGH (ref 0.5–1.5)
Methemoglobin: 1.1 % (ref 0.0–1.5)
O2 Saturation: 69.5 %
Total hemoglobin: 7.8 g/dL — ABNORMAL LOW (ref 12.0–16.0)

## 2019-10-20 LAB — MAGNESIUM: Magnesium: 2.1 mg/dL (ref 1.7–2.4)

## 2019-10-20 MED ORDER — INSULIN ASPART 100 UNIT/ML ~~LOC~~ SOLN
0.0000 [IU] | Freq: Every day | SUBCUTANEOUS | Status: DC
  Administered 2019-10-22: 2 [IU] via SUBCUTANEOUS

## 2019-10-20 MED ORDER — LIP MEDEX EX OINT
TOPICAL_OINTMENT | CUTANEOUS | Status: DC | PRN
  Filled 2019-10-20: qty 7

## 2019-10-20 MED ORDER — SODIUM CHLORIDE 0.9 % IV SOLN
510.0000 mg | INTRAVENOUS | Status: AC
  Administered 2019-10-20: 510 mg via INTRAVENOUS
  Filled 2019-10-20: qty 17

## 2019-10-20 MED ORDER — APIXABAN 5 MG PO TABS
5.0000 mg | ORAL_TABLET | Freq: Two times a day (BID) | ORAL | Status: DC
  Administered 2019-10-20 – 2019-10-25 (×11): 5 mg via ORAL
  Filled 2019-10-20 (×11): qty 1

## 2019-10-20 MED ORDER — MACITENTAN 10 MG PO TABS
10.0000 mg | ORAL_TABLET | Freq: Every day | ORAL | Status: DC
  Filled 2019-10-20: qty 1

## 2019-10-20 MED ORDER — INSULIN DETEMIR 100 UNIT/ML ~~LOC~~ SOLN
8.0000 [IU] | Freq: Every day | SUBCUTANEOUS | Status: DC
  Administered 2019-10-20 – 2019-10-24 (×5): 8 [IU] via SUBCUTANEOUS
  Filled 2019-10-20 (×6): qty 0.08

## 2019-10-20 MED ORDER — POTASSIUM CHLORIDE CRYS ER 20 MEQ PO TBCR
20.0000 meq | EXTENDED_RELEASE_TABLET | Freq: Once | ORAL | Status: AC
  Administered 2019-10-20: 20 meq via ORAL
  Filled 2019-10-20: qty 1

## 2019-10-20 MED ORDER — INSULIN ASPART 100 UNIT/ML ~~LOC~~ SOLN
0.0000 [IU] | Freq: Three times a day (TID) | SUBCUTANEOUS | Status: DC
  Administered 2019-10-20 – 2019-10-22 (×5): 3 [IU] via SUBCUTANEOUS
  Administered 2019-10-22: 2 [IU] via SUBCUTANEOUS
  Administered 2019-10-23 – 2019-10-24 (×3): 3 [IU] via SUBCUTANEOUS
  Administered 2019-10-25: 2 [IU] via SUBCUTANEOUS
  Administered 2019-10-25: 3 [IU] via SUBCUTANEOUS

## 2019-10-20 NOTE — Progress Notes (Addendum)
Patient ID: [name], female   DOB: 05-20-1946, 73 y.o.   MRN: [id number]    Advanced Heart Failure Rounding Note   Subjective:    She remains on milrinone 0.125, IV lasix at 20 and metolazone. Weight down another 8 pounds - 28 pounds total.  Breathing well, no SOB or CP, on 6L [place], home supplemental O2 2L. Less bloated. No n/v. Co-ox 69%. Creatinine 1.3.   She is still hoping to go home.   Swan removed yesterday. PICC in place.    CVP 10 PA 80/27  Objective:   Weight Range:  Vital Signs:   Temp:  [97.5 F (36.4 C)-98.6 F (37 C)] 97.5 F (36.4 C) (11/18 1632) Pulse Rate:  [71-96] 81 (11/19 0500) Resp:  [13-36] 28 (11/19 0500) BP: (85-128)/(38-58) 109/47 (11/19 0500) SpO2:  [89 %-99 %] 94 % (11/19 0500) Last BM Date: 10/18/19  Weight change: Filed Weights   10/18/19 0338 10/18/19 0740 10/19/19 0600  Weight: 115.1 kg (!) 144.3 kg (!) 142 kg    Intake/Output:   Intake/Output Summary (Last 24 hours) at 10/20/2019 0604 Last data filed at 10/20/2019 0400 Gross per 24 hour  Intake 1548.71 ml  Output 3000 ml  Net -1451.29 ml     Physical Exam: General:  Sitting in bed No resp difficulty HEENT: normal, [place] in place.  Anicteric  Neck: supple. JVP 10  RIJ swan Carotids 2+ bilat; no bruits. No lymphadenopathy or thryomegaly appreciated. Cor: PMI nondisplaced. irregular rate & rhythm. No rubs, gallops or murmurs. Lungs: CTA, no wheeze  Abdomen: obese soft, nontender, nondistended. No hepatosplenomegaly. No bruits or masses. +BS Extremities: no cyanosis, clubbing, rash, 1-2+ edema + UNNA Neuro: alert & oriented x 3, cranial nerves grossly intact. moves all 4 extremities w/o difficulty. Affect pleasant  Telemetry: persistent atrial fibrillation, mid 90s Personally reviewed   Labs: Basic Metabolic Panel: Recent Labs  Lab 10/14/19 0259 10/15/19 0410 10/16/19 0339 10/17/19 0309 10/18/19 0358 10/19/19 0405 10/20/19 0315  NA 136 137 137 136 137 138 131*  K  3.8 3.9 4.1 4.0 3.7 4.0 3.9  CL 101 103 101 100 96* 95* 89*  CO2 23 26 27 27 28 30 31   GLUCOSE 211* 172* 176* 157* 150* 147* 283*  BUN 50* 53* 54* 58* 59* 57* 52*  CREATININE 2.27* 2.08* 1.73* 1.69* 1.40* 1.30* 1.36*  CALCIUM 8.1* 8.2* 8.7* 8.6* 9.1 9.1 8.7*  MG 1.9 2.0  --  2.0  --  1.7 2.1    Liver Function Tests: No results for input(s): AST, ALT, ALKPHOS, BILITOT, PROT, ALBUMIN in the last 168 hours. No results for input(s): LIPASE, AMYLASE in the last 168 hours. No results for input(s): AMMONIA in the last 168 hours.  CBC: Recent Labs  Lab 10/16/19 0339 10/17/19 0309 10/18/19 0604 10/19/19 0405 10/20/19 0315  WBC 5.8 5.5 6.6 7.4 6.4  HGB 8.0* 8.0* 9.3* 8.9* 7.7*  HCT 26.3* 25.5* 29.6* 28.4* 25.4*  MCV 100.0 97.7 98.7 97.6 100.4*  PLT 77* 88* 123* 135* 142*    Cardiac Enzymes: No results for input(s): CKTOTAL, CKMB, CKMBINDEX, TROPONINI in the last 168 hours.  BNP: BNP (last 3 results) Recent Labs    12/20/18 1700 06/22/19 1452 10/13/19 0018  BNP 212.5* 187.0* 571.9*    ProBNP (last 3 results) No results for input(s): PROBNP in the last 8760 hours.    Other results:  Imaging: Dg Chest Port 1 View  Result Date: 10/19/2019 CLINICAL DATA:  73 year old female with right-sided PICC.  EXAM: PORTABLE CHEST 1 VIEW COMPARISON:  Chest radiograph dated 10/13/2019. FINDINGS: A Swan-Ganz is noted with tip over the right infrahilar region. Right-sided PICC with tip likely in the upper SVC superimposed over the Swan-Ganz catheter. Cardiomegaly with findings of CHF and bilateral pleural effusions, left greater right. Slight interval improvement of the aeration of the right lung since the prior radiograph. No pneumothorax. Atherosclerotic calcification of the aorta. No acute osseous pathology. IMPRESSION: 1. Right-sided PICC with tip in the upper SVC. 2. Cardiomegaly with findings of CHF and bilateral pleural effusions, left greater right. Overall minimal improvement of the  aeration of the right lung since the prior radiograph. Electronically Signed   By: Anner Crete M.D.   On: 10/19/2019 09:48   [place] Ekg Site Rite  Result Date: 10/18/2019 If Site Rite image not attached, placement could not be confirmed due to current cardiac rhythm.    Medications:     Scheduled Medications: . Chlorhexidine Gluconate Cloth  6 each Topical Daily  . insulin aspart  0-15 Units Subcutaneous Q4H  . mouth rinse  15 mL Mouth Rinse BID  . metolazone  5 mg Oral BID  . midodrine  10 mg Oral TID WC  . sildenafil  40 mg Oral TID  . sodium chloride flush  10-40 mL Intracatheter Q12H  . sodium chloride flush  3 mL Intravenous Q12H  . spironolactone  12.5 mg Oral Daily    Infusions: . sodium chloride 10 mL/hr at 10/20/19 0400  . ferumoxytol Stopped (10/16/19 0959)  . furosemide (LASIX) infusion 20 mg/hr (10/20/19 0400)  . heparin 1,350 Units/hr (10/20/19 0400)  . milrinone 0.125 mcg/kg/min (10/20/19 0400)  . norepinephrine (LEVOPHED) Adult infusion Stopped (10/15/19 1820)    PRN Medications: sodium chloride, acetaminophen, ondansetron (ZOFRAN) IV, sodium chloride flush, sodium chloride flush   Assessment/Plan:   1. PAH with cor pulmonale, end-stage - RHC numbers as above - Likely multifactorial WHO Group, I,II,III - marked R-sided volume overload complicated by cardiorenal syndrome - swan removed yesterday, picc placed.  - started milrinone and lasix gtt 11/12, + metolazone 5 mg bid, +spiro 12.5 mg yesterday - co-ox 69% on milrinone 0.125 - Continues with good UOP. Still volume overloaded with CVP at 10. Creatinine stable - Will continue milrinone for RV support and PAH. Continue Lasix gtt 20 mg/hr and metolazone 5 mg bid today. May stop tomorrow. - Cont. Spironolactone. Continue bilateral unna boots - Continue sildenafil 40 mg tid, added macitentan 10 daily yesterday.  - Continue midodrine 10 mg tid.   2. Acute on chronic diastolic heart failure with R>L HF -  Echo 7/20 EF 60-65% RV reportedly normal with RVSP 85 - d/c weight in 7/20 was 127 kg. At admission 151.4 kg. Today 138 kg. - diuresis limited by hypotension and A/CKD - hypotension resolved - suspect significant component of cardiorenal syndrome, improving - Plan as above. Continue IV diuresis.   3. 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 - 3.2 upon transfer. SCr trending down, now stable at 1.3.  - Hold nephrotoxic agents - Support RV function with milrinone - Renal u/s with severe bilateral cortical thinning - Likely not candidate for HD due to severe PAH   4. Hypotension - Improved with midodrine 10 mg tid. May be able to wean once off lasix - MAPs stable in the mid 70s  5. Permanent AF - On eliquis and cardizem at home.  - switch heparin to eliquis today  -. Hold cardizem  with low BP - current rate mid 90s - Monitor electrolytes and keep K and Mg stable while diuresing.   6. Chronic hypoxic respiratory failure on home O2 - non-smoker. Suspect OSA /OHS/PAH - ABG at OSH  7.30/49/83/97% (only mild CO2 retention)   7 Severe obesity (BMI >= 40)  -Body mass index is 55.54 kg/m.  8. Type 2 diabetes mellitus with nephropathy - Reports taking trulicity and 2u insulin daily at home - A1c 7.9 - Low-carb diet. Cover with SSI   9. Anemia of Chronic disease - consider Aranesp  10. Thrombocytopenia  - plt ct improving from 77K >>88K >> 123 > 135 >> 142. Switching heparin to eliquis today.   11. Debility - PT recommending SNF. She refuses but would consider [facility].   Remains tenuous but improving with inotropic support and diuresis. Will continue IV lasix and milrinone today. Continue sildenafil and macitentan.    Length of Stay: [place], MD 10/20/2019, 6:04 AM  Patient seen and examined with the above-signed Advanced Practice Provider and/or Housestaff. I personally reviewed laboratory data, imaging studies and relevant  notes. I independently examined the patient and formulated the important aspects of the plan. I have edited the note to reflect any of my changes or salient points. I have personally discussed the plan with the patient and/or family.  Volume status continue to improve on milrinone and lasix gtt. Weight now down 35 pounds. She says he dry weight is 265 pounds (she is 305 today). Doubt we can get her that low but will keep pushing. Switching IV heparin to Eliquis. Discussed dosing with PharmD personally.   Continue sildenafil. Will add macitentan soon. Continue PT/OT. Platelets improving.   Can go to SDU.   Glori Bickers, MD  9:19 AM

## 2019-10-20 NOTE — Progress Notes (Signed)
ANTICOAGULATION CONSULT NOTE - Follow Up Consult  Pharmacy Consult for Heparin Indication: atrial fibrillation  Allergies  Allergen Reactions  . Lisinopril     R/t to kidney  . Codeine Nausea And Vomiting    Patient Measurements: Height: 5\' 5"  (165.1 cm) Weight: (!) 313 lb 0.9 oz (142 kg) IBW/kg (Calculated) : 57 Heparin dosing weight: 95.3 kg  Vital Signs: BP: 109/47 (11/19 0500) Pulse Rate: Wendy (11/19 0500)  Labs: Recent Labs    10/18/19 0358  10/18/19 0604 10/19/19 0405 10/20/19 0314 10/20/19 0315  HGB  --    < > 9.3* 8.9*  --  7.7*  HCT  --   --  29.6* 28.4*  --  25.4*  PLT  --   --  123* 135*  --  142*  HEPARINUNFRC 0.55  --   --  0.41 0.38  --   CREATININE 1.40*  --   --  1.30*  --  1.36*   < > = values in this interval not displayed.    Estimated Creatinine Clearance: 51.3 mL/min (A) (by C-G formula based on SCr of 1.36 mg/dL (H)).   Assessment: [name] transferred to [facility] on a heparin drip for atrial fibrillation.  Patient was on apixaban PTA, last dose unknown. Heparin was turned off briefly while in cath lab for swan placement. Pharmacy asked to stop heparin and restart Eliquis.  Patient was on 2.5 mg BID PTA. Given age < 52 years, weight > 60 kg, and Scr < 1.5, will restart on higher dose. CBC low trending down slowly. No overt bleeding noted.   Goal of Therapy:  Monitor platelets by anticoagulation protocol: Yes   Plan:  Stop Heparin Start Eliquis 5 mg PO BID Watch CBC and signs/symptoms bleeding  Richardine Service, PharmD PGY1 Pharmacy Resident Phone: [phone] 10/20/2019  5:48 AM  Please check [email or web address] for unit-specific pharmacy phone numbers.

## 2019-10-20 NOTE — Telephone Encounter (Signed)
Advanced Heart Failure Patient Advocate Encounter  Prior Authorization for Sildenafil has been approved.    PA# [id number] Effective dates: 12/01/2018 through 11/30/2020  Patients co-pay is $0.00

## 2019-10-20 NOTE — Discharge Instructions (Addendum)

## 2019-10-20 NOTE — Telephone Encounter (Signed)
Advanced Heart Failure Patient Advocate Encounter  Prior Authorization for Opsumit has been approved.    ID# [id number] Effective dates: 10/20/19 through 11/30/2020  Enrollment forms to [employer]. Will follow-up.   Audry Riles, PharmD, BCPS, CPP Heart Failure Clinic Pharmacist [phone]

## 2019-10-20 NOTE — Progress Notes (Signed)
Initial Nutrition Assessment  DOCUMENTATION CODES:   Morbid obesity  INTERVENTION:   -Change diet to heart healthy/carb modified with renal fluid restriction  -Add snacks BID  NUTRITION DIAGNOSIS:   Increased nutrient needs related to acute illness as evidenced by estimated needs.  GOAL:   Patient will meet greater than or equal to 90% of their needs  MONITOR:   PO intake, Supplement acceptance, Weight trends, Labs, I & O's  REASON FOR ASSESSMENT:   Rounds    ASSESSMENT:   Patient with PMH significant for CHF, CKD III, DM, pulmonary hypertension, COPD, HTN, HLD, and GERD. Presents this admission with acute exacerbation of CHF.   Sawn removed yesterday. Cr trending down. Likely not candidate for HD given severe PAH.   Pt denies loss of appetite PTA. States she typically eats three meals daily that consist of B-egg, toast L- spaghetti, D- chicken alfredo. Daughter does most of the cooking. Aware that she should follow low sodium recommendation. Appetite okay this admission. Meal completions charted as 50-100% for her last eight meals. Does not wish to have supplements. Willing to have snacks.   Pt endorses a UBW of 265 lb and denies weight loss. Records indicate pt has gained weight over the last year (likely from fluid fluctuations).   I/O: -12,604 ml since admit UOP: 3,450 ml x 24 hrs   Drips: 250 mg lasix in D5, milrinone  Medications: SS novolog, aldactone Labs: Na 131 (L) CBG 141-258 Cr 1.36- down from yesterday    NUTRITION - FOCUSED PHYSICAL EXAM:    Most Recent Value  Orbital Region  No depletion  Upper Arm Region  No depletion  Thoracic and Lumbar Region  No depletion  Buccal Region  No depletion  Temple Region  No depletion  Clavicle Bone Region  No depletion  Clavicle and Acromion Bone Region  No depletion  Scapular Bone Region  No depletion  Dorsal Hand  No depletion  Patellar Region  No depletion  Anterior Thigh Region  No depletion  Posterior  Calf Region  No depletion  Edema (RD Assessment)  Severe  Hair  Reviewed  Eyes  Reviewed  Mouth  Reviewed  Skin  Reviewed  Nails  Reviewed       Diet Order:   Diet Order            Diet heart healthy/carb modified Room service appropriate? Yes; Fluid consistency: Thin; Fluid restriction: 1200 mL Fluid  Diet effective now              EDUCATION NEEDS:   Education needs have been addressed  Skin:  Skin Assessment: Skin Integrity Issues: Skin Integrity Issues:: Other (Comment) Other: MASD- breast/groin  Last BM:  11/19  Height:   Ht Readings from Last 1 Encounters:  10/12/19 5\' 5"  (1.651 m)    Weight:   Wt Readings from Last 1 Encounters:  10/20/19 (!) 138.5 kg    Ideal Body Weight:  56.8 kg  BMI:  Body mass index is 50.81 kg/m.  Estimated Nutritional Needs:   Kcal:  1800-2000 kcal  Protein:  90-105 grams  Fluid:  1.2 L restriction   [name] RD, LDN Clinical Nutrition Pager # [phone]

## 2019-10-20 NOTE — Consult Note (Signed)
   [facility] CM Inpatient Consult   10/20/2019  [name] 07-07-1946 [id number]    Patient screened for potential need of [facility] care management services under her [facility] plan, with 17% risk score for unplanned readmission and hospitalizations.  Patient was previously outreached by [facility] RN CM for EMMI follow-up calls.   [name] transferred out from cardiac ICU today. Per MD notes, patient is a62 y.o.femalewithmorbid obesity, chronic diastolic HF, CKD 3, TU8,EKCMKLKJZ hypertension, permanentatrial fibrillation,chronic hypoxic respiratory failure on home O2 who is transferred from [facility] 11/12 for further management of a/c diastolic HF, PAH and AKI.    At outside hospital, she developed hypotension with attempts at diuresis w/ rising creatinine up to 3.2 (prior baseline 1.6). Lasix stopped and given IVF but BP remained low. NE started at 5 and transferred here ([facility]).   Review of patient's medical record reveals PT/ OT evaluation completed and recommending patient for skilled nursing facility (SNF).  Primary Care Provider is Dr. Claretta Fraise with [facility]  Plan: Will follow for disposition and notify [facility] CM for post discharge follow up of needs.   For questions and additional information, please call:  Jaison Petraglia A. Jamaiya Tunnell, BSN, RN-BC [facility] Liaison Cell: [phone]

## 2019-10-20 NOTE — Progress Notes (Signed)
Inpatient Diabetes Program Recommendations  AACE/ADA: New Consensus Statement on Inpatient Glycemic Control (2015)  Target Ranges:  Prepandial:   less than 140 mg/dL      Peak postprandial:   less than 180 mg/dL (1-2 hours)      Critically ill patients:  140 - 180 mg/dL   Lab Results  Component Value Date   GLUCAP 156 (H) 10/20/2019   HGBA1C 7.8 (H) 10/13/2019    Review of Glycemic Control Results for [name], [name] (MRN [id number]) as of 10/20/2019 12:43  Ref. Range 10/20/2019 00:32 10/20/2019 03:30 10/20/2019 08:21 10/20/2019 11:59  Glucose-Capillary Latest Ref Range: 70 - 99 mg/dL 141 (H) 132 (H) 182 (H) 156 (H)   Diabetes history: DM 2 Outpatient Diabetes medications:  Trulicity 9.67 mg weekly Levemir 50 units daily Metformin 750 mg q AM Current orders for Inpatient glycemic control:  Novolog moderate q 4 hours  Inpatient Diabetes Program Recommendations:   Please consider changing Novolog correction to tid with meals and HS.  Also please consider adding Levemir 8 units bid.   Thanks  Adah Perl, RN, BC-ADM Inpatient Diabetes Coordinator Pager [phone] (8a-5p)

## 2019-10-20 NOTE — Progress Notes (Signed)
Transported to unit 2c without incident

## 2019-10-20 NOTE — Progress Notes (Signed)
Report given to Rn Amina on 2c 12

## 2019-10-20 NOTE — Progress Notes (Signed)
Chaplain engaged in follow-up visit with [name].  [name] verbalized that she would be transferring soon and that she did not know if she would be going to a facility after or if she would be able to go right home.  She stated that the doctor told her that if she gets enough fluid off then she may be able to go home.  [name] is in a place now of just waiting for answers.    Chaplain will continue to follow-up.

## 2019-10-21 LAB — COMPREHENSIVE METABOLIC PANEL
ALT: 14 U/L (ref 0–44)
AST: 20 U/L (ref 15–41)
Albumin: 2.7 g/dL — ABNORMAL LOW (ref 3.5–5.0)
Alkaline Phosphatase: 144 U/L — ABNORMAL HIGH (ref 38–126)
Anion gap: 12 (ref 5–15)
BUN: 53 mg/dL — ABNORMAL HIGH (ref 8–23)
CO2: 33 mmol/L — ABNORMAL HIGH (ref 22–32)
Calcium: 9.1 mg/dL (ref 8.9–10.3)
Chloride: 90 mmol/L — ABNORMAL LOW (ref 98–111)
Creatinine, Ser: 1.54 mg/dL — ABNORMAL HIGH (ref 0.44–1.00)
GFR calc Af Amer: 38 mL/min — ABNORMAL LOW (ref >60)
GFR calc non Af Amer: 33 mL/min — ABNORMAL LOW (ref >60)
Glucose, Bld: 187 mg/dL — ABNORMAL HIGH (ref 70–99)
Potassium: 4.3 mmol/L (ref 3.5–5.1)
Sodium: 135 mmol/L (ref 135–145)
Total Bilirubin: 1.3 mg/dL — ABNORMAL HIGH (ref 0.3–1.2)
Total Protein: 6 g/dL — ABNORMAL LOW (ref 6.5–8.1)

## 2019-10-21 LAB — MAGNESIUM: Magnesium: 2.1 mg/dL (ref 1.7–2.4)

## 2019-10-21 LAB — CBC
HCT: 26.9 % — ABNORMAL LOW (ref 36.0–46.0)
Hemoglobin: 8.4 g/dL — ABNORMAL LOW (ref 12.0–15.0)
MCH: 30.7 pg (ref 26.0–34.0)
MCHC: 31.2 g/dL (ref 30.0–36.0)
MCV: 98.2 fL (ref 80.0–100.0)
Platelets: 159 10*3/uL (ref 150–400)
RBC: 2.74 MIL/uL — ABNORMAL LOW (ref 3.87–5.11)
RDW: 15.9 % — ABNORMAL HIGH (ref 11.5–15.5)
WBC: 6.9 10*3/uL (ref 4.0–10.5)
nRBC: 0 % (ref 0.0–0.2)

## 2019-10-21 LAB — GLUCOSE, CAPILLARY
Glucose-Capillary: 144 mg/dL — ABNORMAL HIGH (ref 70–99)
Glucose-Capillary: 171 mg/dL — ABNORMAL HIGH (ref 70–99)
Glucose-Capillary: 140 mg/dL — ABNORMAL HIGH (ref 70–99)
Glucose-Capillary: 149 mg/dL — ABNORMAL HIGH (ref 70–99)

## 2019-10-21 LAB — COOXEMETRY PANEL
Carboxyhemoglobin: 2.2 % — ABNORMAL HIGH (ref 0.5–1.5)
Methemoglobin: 1.3 % (ref 0.0–1.5)
O2 Saturation: 73.2 %
Total hemoglobin: 8.8 g/dL — ABNORMAL LOW (ref 12.0–16.0)

## 2019-10-21 MED ORDER — MACITENTAN 10 MG PO TABS
10.0000 mg | ORAL_TABLET | Freq: Every day | ORAL | Status: DC
  Administered 2019-10-22 – 2019-10-25 (×4): 10 mg via ORAL
  Filled 2019-10-21 (×4): qty 1

## 2019-10-21 NOTE — TOC Initial Note (Signed)
Transition of Care [facility]) - Initial/Assessment Note    Patient Details  Name: [name] MRN: [id number] Date of Birth: January 07, 1946  Transition [facility]) CM/SW Contact:    Gelene Mink, [place] Phone Number: 10/21/2019, 3:46 PM  Clinical Narrative:                  CSW called and spoke with the patient. CSW introduced herself and explained her role. CSW shared therapy recommendation. Patient stated she did not want to go to rehab, she wanted to go home. She stated that she lives with her granddaughter. CSW asked if she had any other familial support, she stated her daughter comes over daily. She stated she felt safe discharging home. CSW asked about DME, she stated she had a wheel chair and hospital bed at home. She asked for a bedside commode. CSW asked if she would like a 3-in-1, she stated yes. Patient is also on home 02. She stated her concentrator is broken and she did not know what agency provided her 52. CSW thanked the patient for her time and stated she would coordinate home health for her. Patient did not have a preference of home health agency.   CSW called [facility], they do not have a good contract with [employer]. CSW called [facility] and spoke with Tiffany. She stated she would have to run it by the office but would let her know if they could take it.   CSW called Zack with [employer]. He can order a replacement concentrator for the patient's 02. He can also provided a 3-in-1 to the patient's room.   Patient will need DME and Kimberly orders. CSW will continue to follow and assist with discharge planning.   Expected Discharge Plan: [place] Barriers to Discharge: Continued Medical Work up   Patient Goals and CMS Choice Patient states their goals for this hospitalization and ongoing recovery are:: Pt stated she wanted to go home CMS Medicare.gov Compare Post Acute Care list provided to:: Patient Choice offered to / list presented to : Patient  Expected  Discharge Plan and Services Expected Discharge Plan: [place] In-house Referral: Clinical Social Work Discharge Planning Services: NA Post Acute Care Choice: Home Health Living arrangements for the past 2 months: Single Family Home                 DME Arranged: 3-N-1, Oxygen DME Agency: [employer] Date DME Agency Contacted: 10/21/19 Time DME Agency Contacted: [phone] Representative spoke with at DME Agency: [place]: Nurse's Aide, PT, OT, RN [place] Agency: [facility] [facility] [employer] Date [place]: 10/21/19 Time [place]: 96 Representative spoke with at [place]: Tiffany(Awaiting confirmation they can take her)  Prior Living Arrangements/Services Living arrangements for the past 2 months: [place] with:: Adult Children, Other (Comment)(Grandchildren) Patient language and need for interpreter reviewed:: No Do you feel safe going back to the place where you live?: Yes      Need for Family Participation in Patient Care: Yes (Comment) Care giver support system in place?: Yes (comment) Current home services: DME, Other (comment)(Home 02) Criminal Activity/Legal Involvement Pertinent to Current Situation/Hospitalization: No - Comment as needed  Activities of Daily Living Home Assistive Devices/Equipment: Eyeglasses, Gilford Rile (specify type) ADL Screening (condition at time of admission) Patient's cognitive ability adequate to safely complete daily activities?: Yes Is the patient deaf or have difficulty hearing?: Yes Does the patient have  difficulty seeing, even when wearing glasses/contacts?: Yes Does the patient have difficulty concentrating, remembering, or making decisions?: Yes Patient able to express need for assistance with ADLs?: Yes Does the patient have difficulty dressing or bathing?: No Independently performs ADLs?: No Communication: Independent Dressing (OT): Independent Grooming: Needs  assistance Is this a change from baseline?: Pre-admission baseline Feeding: Needs assistance Is this a change from baseline?: Pre-admission baseline Bathing: Independent Toileting: Independent In/Out Bed: Needs assistance Is this a change from baseline?: Pre-admission baseline Walks in Home: Independent(Only walks to the bathroom with a walker) Does the patient have difficulty walking or climbing stairs?: Yes Weakness of Legs: Both Weakness of Arms/Hands: Both  Permission Sought/Granted Permission sought to share information with : Case Manager Permission granted to share information with : Yes, Verbal Permission Granted     Permission granted to share info w AGENCY: [place]        Emotional Assessment Appearance:: Appears stated age Attitude/Demeanor/Rapport: Engaged Affect (typically observed): Calm Orientation: : Oriented to Self, Oriented to Place, Oriented to  Time, Oriented to Situation Alcohol / Substance Use: Not Applicable Psych Involvement: No (comment)  Admission diagnosis:  HEART FAILURE Patient Active Problem List   Diagnosis Date Noted  . Acute on chronic diastolic (congestive) heart failure ([place]) 10/12/2019  . TIA (transient ischemic attack)   . Acute renal failure superimposed on stage 3 chronic kidney disease ([place])   . Dyspnea 06/23/2019  . Facial droop 06/22/2019  . Educated about COVID-19 virus infection 04/12/2019  . Leg swelling 04/12/2019  . Coronary artery disease involving native coronary artery of native heart without angina pectoris 04/12/2019  . Hyperlipidemia 02/23/2018  . Current use of long term anticoagulation 08/26/2017  . Hypothyroidism 02/04/2017  . Long-term current use of insulin for diabetes mellitus ([place]) 08/15/2015  . Chronic renal insufficiency 08/15/2015  . Diabetic nephropathy associated with type 2 diabetes mellitus ([place]) 08/15/2015  . Osteoporosis 02/21/2015  . Normocytic anemia due to blood loss 05/09/2013  .  Thrombocytopenia, unspecified ([place]) 05/09/2013  . Severe obesity (BMI >= 40) ([place]) 02/28/2008  . PULMONARY HYPERTENSION 02/28/2008  . Congestive heart failure ([place]) 02/28/2008  . PULMONARY NODULE, LEFT UPPER LOBE 02/28/2008  . OBSTRUCTIVE SLEEP APNEA 02/28/2008   PCP:  Claretta Fraise, MD Pharmacy:   [id number] [place], [address] [place] [address] Phone: [phone] Fax: [phone]  [facility] [place], [address] [place] [address] Phone: [phone] Fax: [phone]     Social Determinants of Health (SDOH) Interventions    Readmission Risk Interventions No flowsheet data found.

## 2019-10-21 NOTE — Telephone Encounter (Signed)
Advanced Heart Failure Patient Advocate Encounter  Received notice that Opsumit is approved through 12/01/19 but that it will be Non-formulary for the year 2021. Insurance prefers Ambrisentan starting 12/02/2019. Submitted non-formulary request for Opsumit. Status is pending, will continue to follow.   Update 10/21/2019: Nonformulary request APPROVED through 11/30/2020.  Audry Riles, PharmD, BCPS, BCCP, CPP Heart Failure Clinic Pharmacist [phone]

## 2019-10-21 NOTE — Progress Notes (Addendum)
Patient ID: KEA CALLAN, female   DOB: Dec 27, 1945, 73 y.o.   MRN: [id number]    Advanced Heart Failure Rounding Note   Subjective:    She remains on milrinone 0.125, IV lasix at 20 and metolazone bid. No weight off overnight but - 30 pounds total. Net negative 1.5 L last 24 hours.   Breathing well, no SOB or CP, on 4L [place], home supplemental O2 2L. No n/v. Co-ox 69%. Creatinine slightly increased this morning.    Swan removed. PICC in place.    CVP 17 today.   Initial RHC numbers  RA = 19 RV = 84/19 PA = 92/28 (48) PCW = 22 Fick cardiac output/index = 7.7/3.1 Thermo CO/CI = 4.6/1.9 PVR = 5.5 WU (thermo) Ao sat = 99% PA sat = 65%, 67%  Objective:   Weight Range:  Vital Signs:   Temp:  [98.3 F (36.8 C)-99 F (37.2 C)] 99 F (37.2 C) (11/20 0336) Pulse Rate:  [73-90] 81 (11/20 0021) Resp:  [16-34] 34 (11/20 0336) BP: (87-131)/(38-84) 99/84 (11/20 0336) SpO2:  [91 %-99 %] 93 % (11/20 0336) Weight:  [138.2 kg] 138.2 kg (11/20 0612) Last BM Date: 10/20/19  Weight change: Filed Weights   10/19/19 0600 10/20/19 0500 10/21/19 0612  Weight: (!) 142 kg (!) 138.5 kg (!) 138.2 kg    Intake/Output:   Intake/Output Summary (Last 24 hours) at 10/21/2019 0617 Last data filed at 10/21/2019 4709 Gross per 24 hour  Intake 940.53 ml  Output 3251 ml  Net -2310.47 ml     Physical Exam: General:  Sitting in bed No resp difficulty HEENT: normal, [place] in place.  Anicteric  Neck: supple. JVP 10 today, Carotids 2+ bilat; no bruits.  Cor: PMI nondisplaced. irregular rate & rhythm. No rubs, gallops or murmurs. Lungs: CTA, no wheezing, rales, rhonchi Abdomen: obese soft, nontender, nondistended. No hepatosplenomegaly. No bruits or masses. +BS Extremities: no cyanosis, clubbing, rash, 1-2+ edema Neuro: alert & oriented x 3, cranial nerves grossly intact. moves all 4 extremities w/o difficulty. Affect pleasant  Telemetry: persistent atrial fibrillation, mid 90s Personally  reviewed   Labs: Basic Metabolic Panel: Recent Labs  Lab 10/15/19 0410 10/16/19 0339 10/17/19 0309 10/18/19 0358 10/19/19 0405 10/20/19 0315  NA 137 137 136 137 138 131*  K 3.9 4.1 4.0 3.7 4.0 3.9  CL 103 101 100 96* 95* 89*  CO2 26 27 27 28 30 31   GLUCOSE 172* 176* 157* 150* 147* 283*  BUN 53* 54* 58* 59* 57* 52*  CREATININE 2.08* 1.73* 1.69* 1.40* 1.30* 1.36*  CALCIUM 8.2* 8.7* 8.6* 9.1 9.1 8.7*  MG 2.0  --  2.0  --  1.7 2.1    Liver Function Tests: No results for input(s): AST, ALT, ALKPHOS, BILITOT, PROT, ALBUMIN in the last 168 hours. No results for input(s): LIPASE, AMYLASE in the last 168 hours. No results for input(s): AMMONIA in the last 168 hours.  CBC: Recent Labs  Lab 10/16/19 0339 10/17/19 0309 10/18/19 0604 10/19/19 0405 10/20/19 0315  WBC 5.8 5.5 6.6 7.4 6.4  HGB 8.0* 8.0* 9.3* 8.9* 7.7*  HCT 26.3* 25.5* 29.6* 28.4* 25.4*  MCV 100.0 97.7 98.7 97.6 100.4*  PLT 77* 88* 123* 135* 142*    Cardiac Enzymes: No results for input(s): CKTOTAL, CKMB, CKMBINDEX, TROPONINI in the last 168 hours.  BNP: BNP (last 3 results) Recent Labs    12/20/18 1700 06/22/19 1452 10/13/19 0018  BNP 212.5* 187.0* 571.9*    ProBNP (last 3 results) No  results for input(s): PROBNP in the last 8760 hours.    Other results:  Imaging: Dg Chest Port 1 View  Result Date: 10/19/2019 CLINICAL DATA:  73 year old female with right-sided PICC. EXAM: PORTABLE CHEST 1 VIEW COMPARISON:  Chest radiograph dated 10/13/2019. FINDINGS: A Swan-Ganz is noted with tip over the right infrahilar region. Right-sided PICC with tip likely in the upper SVC superimposed over the Swan-Ganz catheter. Cardiomegaly with findings of CHF and bilateral pleural effusions, left greater right. Slight interval improvement of the aeration of the right lung since the prior radiograph. No pneumothorax. Atherosclerotic calcification of the aorta. No acute osseous pathology. IMPRESSION: 1. Right-sided PICC  with tip in the upper SVC. 2. Cardiomegaly with findings of CHF and bilateral pleural effusions, left greater right. Overall minimal improvement of the aeration of the right lung since the prior radiograph. Electronically Signed   By: Anner Crete M.D.   On: 10/19/2019 09:48     Medications:     Scheduled Medications: . apixaban  5 mg Oral BID  . Chlorhexidine Gluconate Cloth  6 each Topical Daily  . insulin aspart  0-15 Units Subcutaneous TID WC  . insulin aspart  0-5 Units Subcutaneous QHS  . insulin detemir  8 Units Subcutaneous QHS  . mouth rinse  15 mL Mouth Rinse BID  . metolazone  5 mg Oral BID  . midodrine  10 mg Oral TID WC  . sildenafil  40 mg Oral TID  . sodium chloride flush  10-40 mL Intracatheter Q12H  . sodium chloride flush  3 mL Intravenous Q12H  . spironolactone  12.5 mg Oral Daily    Infusions: . sodium chloride 10 mL/hr at 10/20/19 1300  . furosemide (LASIX) infusion 20 mg/hr (10/21/19 0248)  . milrinone 0.125 mcg/kg/min (10/20/19 2143)    PRN Medications: sodium chloride, acetaminophen, lip balm, ondansetron (ZOFRAN) IV, sodium chloride flush, sodium chloride flush   Assessment/Plan:   1. PAH with cor pulmonale, end-stage - RHC numbers as above - Likely multifactorial WHO Group, I,II,III - marked R-sided volume overload complicated by cardiorenal syndrome - started milrinone and lasix gtt 11/12, + metolazone 5 mg bid, +spiro 12.5 mg - co-ox 73% on milrinone 0.125 - Continues with good UOP. Creatinine slightly increased today but still volume overloaded with CVP at 10. Weight at last discharge ~280 lbs, currently 305.  - Will continue milrinone for RV support and PAH. - Continue Lasix gtt 20 mg/hr and metolazone 5 mg bid today.   - Cont. Spironolactone. Continue bilateral unna boots - Continue sildenafil 40 mg tid, will need to add macitentan pending insurance, otherwise can consider ambrisentan.  - Continue midodrine 10 mg tid.   2. Acute on  chronic diastolic heart failure with R>L HF - Echo 7/20 EF 60-65% RV reportedly normal with RVSP 85 - d/c weight in 7/20 was 127 kg (280 lbs). At admission 151.4 kg. Today and yesterday 138 kg. - diuresis limited by hypotension and A/CKD - hypotension resolved - suspect significant component of cardiorenal syndrome, improving - Plan as above. Continue IV diuresis.   3. 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 - 3.2 upon transfer. SCr trending down, 1.3 > 1.5 today  - Hold nephrotoxic agents - Support RV function with milrinone - Renal u/s with severe bilateral cortical thinning - Likely not candidate for HD due to severe PAH   4. Hypotension - Improved with midodrine 10 mg tid. May be able to wean once off lasix -  MAPs stable in the mid 70s  5. Permanent AF - On eliquis and cardizem at home.  - switched to eliquis yesterday  -. Hold cardizem with low BP - current rate mid 90s - Monitor electrolytes and keep K and Mg stable while diuresing.   6. Chronic hypoxic respiratory failure on home O2 - non-smoker. Suspect OSA /OHS/PAH - ABG at OSH  7.30/49/83/97% (only mild CO2 retention)   7 Severe obesity (BMI >= 40)  -Body mass index is 55.54 kg/m.  8. Type 2 diabetes mellitus with nephropathy - Reports taking trulicity and 2u insulin daily at home - A1c 7.9 - Low-carb diet. Cover with SSI   9. Anemia of Chronic disease - consider Aranesp  10. Thrombocytopenia  - resolved. plt 159.   11. Debility - PT recommending SNF. She refuses but [facility] consider [facility].   Length of Stay: [place], MD 10/21/2019, 6:17 AM  Patient seen and examined with the above-signed Advanced Practice Provider and/or Housestaff. I personally reviewed laboratory data, imaging studies and relevant notes. I independently examined the patient and formulated the important aspects of the plan. I have edited the note to reflect any of my changes or salient  points. I have personally discussed the plan with the patient and/or family.  She continues to diurese well. But weight appears unchanged. Renal function up just slightly but urine remains clear and CVP elevated. Will continue IV lasix at least one more day and push diuresis as much as RV can tolerate. Continue sildenafil at 40 tid. Spoke with PharmD about starting ERA. Working on macitentan 10 daily but may not be able to get approval and need to switch to letairis.   AF remains rate controlled. Now on Eliquis. Continue PT.   Glori Bickers, MD  11:03 AM

## 2019-10-21 NOTE — Telephone Encounter (Addendum)
Obtained grant for [employer] via [employer] ID: [id number] Group: [id number] PCN: [place]: [id number] Processing: 08 Phone: [phone] Fax: [phone]  [name] is conditional from 10/21/19-11/20/19. After patient fills out HIPAA authorization for [employer] will be good through 11/30/2020.  Patient is aware.   Audry Riles, PharmD, BCPS, BCCP, CPP Heart Failure Clinic Pharmacist [phone]

## 2019-10-21 NOTE — Plan of Care (Signed)
  Problem: Education: Goal: Knowledge of General Education information will improve Description: Including pain rating scale, medication(s)/side effects and non-pharmacologic comfort measures Outcome: Progressing   Problem: Clinical Measurements: Goal: Ability to maintain clinical measurements within normal limits will improve Outcome: Progressing Goal: Will remain free from infection Outcome: Progressing Goal: Diagnostic test results will improve Outcome: Progressing Goal: Respiratory complications will improve Outcome: Progressing Goal: Cardiovascular complication will be avoided Outcome: Progressing   Problem: Health Behavior/Discharge Planning: Goal: Ability to manage health-related needs will improve Outcome: Progressing   Problem: Activity: Goal: Risk for activity intolerance will decrease Outcome: Progressing   Problem: Nutrition: Goal: Adequate nutrition will be maintained Outcome: Progressing   Problem: Coping: Goal: Level of anxiety will decrease Outcome: Progressing   Problem: Elimination: Goal: Will not experience complications related to bowel motility Outcome: Progressing Goal: Will not experience complications related to urinary retention Outcome: Progressing   Problem: Pain Managment: Goal: General experience of comfort will improve Outcome: Progressing   Problem: Safety: Goal: Ability to remain free from injury will improve Outcome: Progressing   Problem: Skin Integrity: Goal: Risk for impaired skin integrity will decrease Outcome: Progressing   Problem: Education: Goal: Ability to demonstrate management of disease process will improve Outcome: Progressing Goal: Ability to verbalize understanding of medication therapies will improve Outcome: Progressing Goal: Individualized Educational Video(s) Outcome: Progressing   Problem: Activity: Goal: Capacity to carry out activities will improve Outcome: Progressing   Problem: Cardiac: Goal:  Ability to achieve and maintain adequate cardiopulmonary perfusion will improve Outcome: Progressing   Problem: Education: Goal: Knowledge of the prescribed therapeutic regimen will improve Outcome: Progressing   Problem: Activity: Goal: Risk for activity intolerance will decrease Outcome: Progressing   Problem: Cardiac: Goal: Ability to maintain an adequate cardiac output will improve Outcome: Progressing   Problem: Coping: Goal: Level of anxiety will decrease Outcome: Progressing   Problem: Fluid Volume: Goal: Risk for excess fluid volume will decrease Outcome: Progressing   Problem: Clinical Measurements: Goal: Ability to maintain clinical measurements within normal limits will improve Outcome: Progressing Goal: Will remain free from infection Outcome: Progressing   Problem: Respiratory: Goal: Will regain and/or maintain adequate ventilation Outcome: Progressing

## 2019-10-21 NOTE — Care Management Important Message (Signed)
Important Message  Patient Details  Name: [name] MRN: [id number] Date of Birth: 06-17-1946   Medicare Important Message Given:  Yes     Memory Argue 10/21/2019, 12:01 PM

## 2019-10-22 DIAGNOSIS — I272 Pulmonary hypertension, unspecified: Secondary | ICD-10-CM

## 2019-10-22 LAB — BASIC METABOLIC PANEL
Anion gap: 10 (ref 5–15)
BUN: 53 mg/dL — ABNORMAL HIGH (ref 8–23)
CO2: 37 mmol/L — ABNORMAL HIGH (ref 22–32)
Calcium: 9 mg/dL (ref 8.9–10.3)
Chloride: 89 mmol/L — ABNORMAL LOW (ref 98–111)
Creatinine, Ser: 1.72 mg/dL — ABNORMAL HIGH (ref 0.44–1.00)
GFR calc Af Amer: 33 mL/min — ABNORMAL LOW (ref >60)
GFR calc non Af Amer: 29 mL/min — ABNORMAL LOW (ref >60)
Glucose, Bld: 137 mg/dL — ABNORMAL HIGH (ref 70–99)
Potassium: 3.8 mmol/L (ref 3.5–5.1)
Sodium: 136 mmol/L (ref 135–145)

## 2019-10-22 LAB — CBC
HCT: 28.1 % — ABNORMAL LOW (ref 36.0–46.0)
Hemoglobin: 8.6 g/dL — ABNORMAL LOW (ref 12.0–15.0)
MCH: 30.8 pg (ref 26.0–34.0)
MCHC: 30.6 g/dL (ref 30.0–36.0)
MCV: 100.7 fL — ABNORMAL HIGH (ref 80.0–100.0)
Platelets: 167 10*3/uL (ref 150–400)
RBC: 2.79 MIL/uL — ABNORMAL LOW (ref 3.87–5.11)
RDW: 15.9 % — ABNORMAL HIGH (ref 11.5–15.5)
WBC: 6.5 10*3/uL (ref 4.0–10.5)
nRBC: 0 % (ref 0.0–0.2)

## 2019-10-22 LAB — GLUCOSE, CAPILLARY
Glucose-Capillary: 145 mg/dL — ABNORMAL HIGH (ref 70–99)
Glucose-Capillary: 183 mg/dL — ABNORMAL HIGH (ref 70–99)
Glucose-Capillary: 191 mg/dL — ABNORMAL HIGH (ref 70–99)
Glucose-Capillary: 217 mg/dL — ABNORMAL HIGH (ref 70–99)

## 2019-10-22 LAB — COOXEMETRY PANEL
Carboxyhemoglobin: 1.9 % — ABNORMAL HIGH (ref 0.5–1.5)
Methemoglobin: 1.3 % (ref 0.0–1.5)
O2 Saturation: 65.4 %
Total hemoglobin: 10 g/dL — ABNORMAL LOW (ref 12.0–16.0)

## 2019-10-22 LAB — SARS CORONAVIRUS 2 (TAT 6-24 HRS): SARS Coronavirus 2: NEGATIVE

## 2019-10-22 MED ORDER — LEVOTHYROXINE SODIUM 50 MCG PO TABS
50.0000 ug | ORAL_TABLET | Freq: Every day | ORAL | Status: DC
  Administered 2019-10-22 – 2019-10-25 (×4): 50 ug via ORAL
  Filled 2019-10-22 (×4): qty 1

## 2019-10-22 MED ORDER — POTASSIUM CHLORIDE CRYS ER 20 MEQ PO TBCR
40.0000 meq | EXTENDED_RELEASE_TABLET | Freq: Once | ORAL | Status: AC
  Administered 2019-10-22: 40 meq via ORAL
  Filled 2019-10-22: qty 2

## 2019-10-22 MED ORDER — FERROUS SULFATE 325 (65 FE) MG PO TABS
325.0000 mg | ORAL_TABLET | Freq: Every day | ORAL | Status: DC
  Administered 2019-10-22 – 2019-10-24 (×3): 325 mg via ORAL
  Filled 2019-10-22 (×3): qty 1

## 2019-10-22 MED ORDER — TORSEMIDE 20 MG PO TABS
60.0000 mg | ORAL_TABLET | Freq: Two times a day (BID) | ORAL | Status: DC
  Administered 2019-10-22 – 2019-10-23 (×2): 60 mg via ORAL
  Filled 2019-10-22 (×2): qty 3

## 2019-10-22 MED ORDER — MILRINONE LACTATE IN DEXTROSE 20-5 MG/100ML-% IV SOLN
0.1250 ug/kg/min | INTRAVENOUS | Status: DC
  Administered 2019-10-22 – 2019-10-23 (×2): 0.125 ug/kg/min via INTRAVENOUS
  Filled 2019-10-22: qty 100

## 2019-10-22 MED ORDER — GABAPENTIN 300 MG PO CAPS
300.0000 mg | ORAL_CAPSULE | Freq: Every day | ORAL | Status: DC
  Administered 2019-10-22 – 2019-10-24 (×3): 300 mg via ORAL
  Filled 2019-10-22 (×3): qty 1

## 2019-10-22 NOTE — Progress Notes (Signed)
Patient ID: [name], female   DOB: Jan 26, 1946, 73 y.o.   MRN: [id number]    Advanced Heart Failure Rounding Note   Subjective:    She remains on milrinone 0.125, IV lasix at 20 and metolazone bid. Good diuresis yesterday, weight down and I/Os negative.  Creatinine higher at 1.72 today. CVP 13.  Breathing well, no SOB or CP, on 4L [place], home supplemental O2 2L. Co-ox 65%.   Initial RHC numbers  RA = 19 RV = 84/19 PA = 92/28 (48) PCW = 22 Fick cardiac output/index = 7.7/3.1 Thermo CO/CI = 4.6/1.9 PVR = 5.5 WU (thermo) Ao sat = 99% PA sat = 65%, 67%  Objective:   Weight Range:  Vital Signs:   Temp:  [97.4 F (36.3 C)-98.5 F (36.9 C)] 98.4 F (36.9 C) (11/21 0745) Pulse Rate:  [69-81] 81 (11/21 0745) Resp:  [18-26] 26 (11/21 0745) BP: (111-128)/(42-51) 111/46 (11/21 0745) SpO2:  [93 %-96 %] 93 % (11/21 0745) Weight:  [133.5 kg] 133.5 kg (11/21 0336) Last BM Date: 10/22/19  Weight change: Filed Weights   10/20/19 0500 10/21/19 0612 10/22/19 0336  Weight: (!) 138.5 kg (!) 138.2 kg 133.5 kg    Intake/Output:   Intake/Output Summary (Last 24 hours) at 10/22/2019 0902 Last data filed at 10/22/2019 0748 Gross per 24 hour  Intake 1038.64 ml  Output 3850 ml  Net -2811.36 ml     Physical Exam: General: NAD Neck: JVP 10 cm, no thyromegaly or thyroid nodule.  Lungs: Decreased BS at bases.  CV: Nondisplaced PMI.  Heart irregular S1/S2, no S3/S4, no murmur.  Lower legs wrapped.   Abdomen: Soft, nontender, no hepatosplenomegaly, no distention.  Skin: Intact without lesions or rashes.  Neurologic: Alert and oriented x 3.  Psych: Normal affect. Extremities: No clubbing or cyanosis.  HEENT: Normal.   Telemetry: Atrial fibrillation 90s. Personally reviewed   Labs: Basic Metabolic Panel: Recent Labs  Lab 10/17/19 0309 10/18/19 0358 10/19/19 0405 10/20/19 0315 10/21/19 0600 10/22/19 0349  NA 136 137 138 131* 135 136  K 4.0 3.7 4.0 3.9 4.3 3.8  CL 100  96* 95* 89* 90* 89*  CO2 27 28 30 31  33* 37*  GLUCOSE 157* 150* 147* 283* 187* 137*  BUN 58* 59* 57* 52* 53* 53*  CREATININE 1.69* 1.40* 1.30* 1.36* 1.54* 1.72*  CALCIUM 8.6* 9.1 9.1 8.7* 9.1 9.0  MG 2.0  --  1.7 2.1 2.1  --     Liver Function Tests: Recent Labs  Lab 10/21/19 0600  AST 20  ALT 14  ALKPHOS 144*  BILITOT 1.3*  PROT 6.0*  ALBUMIN 2.7*   No results for input(s): LIPASE, AMYLASE in the last 168 hours. No results for input(s): AMMONIA in the last 168 hours.  CBC: Recent Labs  Lab 10/18/19 0604 10/19/19 0405 10/20/19 0315 10/21/19 0600 10/22/19 0349  WBC 6.6 7.4 6.4 6.9 6.5  HGB 9.3* 8.9* 7.7* 8.4* 8.6*  HCT 29.6* 28.4* 25.4* 26.9* 28.1*  MCV 98.7 97.6 100.4* 98.2 100.7*  PLT 123* 135* 142* 159 167    Cardiac Enzymes: No results for input(s): CKTOTAL, CKMB, CKMBINDEX, TROPONINI in the last 168 hours.  BNP: BNP (last 3 results) Recent Labs    12/20/18 1700 06/22/19 1452 10/13/19 0018  BNP 212.5* 187.0* 571.9*    ProBNP (last 3 results) No results for input(s): PROBNP in the last 8760 hours.    Other results:  Imaging: No results found.   Medications:     Scheduled  Medications: . apixaban  5 mg Oral BID  . Chlorhexidine Gluconate Cloth  6 each Topical Daily  . insulin aspart  0-15 Units Subcutaneous TID WC  . insulin aspart  0-5 Units Subcutaneous QHS  . insulin detemir  8 Units Subcutaneous QHS  . macitentan  10 mg Oral Daily  . mouth rinse  15 mL Mouth Rinse BID  . midodrine  10 mg Oral TID WC  . potassium chloride  40 mEq Oral Once  . sildenafil  40 mg Oral TID  . sodium chloride flush  10-40 mL Intracatheter Q12H  . sodium chloride flush  3 mL Intravenous Q12H  . spironolactone  12.5 mg Oral Daily  . torsemide  60 mg Oral BID    Infusions: . sodium chloride 10 mL/hr at 10/21/19 1500  . milrinone 0.125 mcg/kg/min (10/21/19 1953)    PRN Medications: sodium chloride, acetaminophen, lip balm, ondansetron (ZOFRAN) IV,  sodium chloride flush, sodium chloride flush   Assessment/Plan:   1. PAH with cor pulmonale, end-stage - RHC numbers as above - Likely multifactorial WHO Group, I,II,III - marked R-sided volume overload complicated by cardiorenal syndrome - started milrinone and lasix gtt 11/12, + metolazone 5 mg bid, +spiro 12.5 mg - co-ox 65% on milrinone 0.125. Will continue milrinone today for RV support and PAH.  Will continue milrinone today, will hopefully stop soon when creatinine stabilizes.  - Continue sildenafil 40 mg tid, added macitentan 10 daily.   - Continue midodrine 10 mg tid.  - Good diuresis yesterday.  Creatinine up again today to 1.74.  CVP 13 this morning, suspect CVP 10-12 range will be the best we can do for her with RV failure.  Will stop IV Lasix and metolazone today and start torsemide 60 mg bid this evening (already had metolazone today).   2. Acute on chronic diastolic heart failure with R>L HF - Echo 7/20 EF 60-65% RV reportedly normal with RVSP 85 - d/c weight in 7/20 was 127 kg (280 lbs). At admission 151.4 kg. Today and yesterday 138 kg. - diuresis limited by hypotension and A/CKD - hypotension resolved - suspect significant component of cardiorenal syndrome, improving - Plan as above. Stopping IV Lasix and metolazone today.   3. 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 - 3.2 upon transfer. Creatinine initially trended down, now going back up 1.3 > 1.5 > 1.74 today  - Hold nephrotoxic agents - Support RV function with milrinone - Renal u/s with severe bilateral cortical thinning - Likely not candidate for HD due to severe PAH   4. Hypotension - Improved with midodrine 10 mg tid. May be able to wean once off lasix - BP stable today.   5. Permanent AF - On eliquis and cardizem at home.  - Continue Eliquis.  - Hold cardizem with low BP.  - current rate mid 90s - Monitor electrolytes and keep K and Mg stable while diuresing.   6.  Chronic hypoxic respiratory failure on home O2 - non-smoker. Suspect OSA /OHS/PAH - ABG at OSH  7.30/49/83/97% (only mild CO2 retention)   7 Severe obesity (BMI >= 40)  -Body mass index is 55.54 kg/m.  8. Type 2 diabetes mellitus with nephropathy - Reports taking trulicity and 2u insulin daily at home - A1c 7.9 - Low-carb diet. Cover with SSI   9. Anemia of Chronic disease - consider [facility]  10. Thrombocytopenia  - resolved.   11. Debility - PT recommending SNF. She refuses  but would consider [facility].   Length of Stay: 6   Loralie Champagne, MD 10/22/2019, 9:02 AM

## 2019-10-22 NOTE — Progress Notes (Signed)
Removed F-cath and placed purewick. At the beginning [place] didn't work and she had large amount urine. Patient had blood in urine with BM, she has hemorrhoid and little laceration. Continue to monitor this. Pt stayed on the chair all afternoon, put back on the bed. HS [employer]

## 2019-10-23 LAB — GLUCOSE, CAPILLARY
Glucose-Capillary: 114 mg/dL — ABNORMAL HIGH (ref 70–99)
Glucose-Capillary: 189 mg/dL — ABNORMAL HIGH (ref 70–99)
Glucose-Capillary: 156 mg/dL — ABNORMAL HIGH (ref 70–99)
Glucose-Capillary: 138 mg/dL — ABNORMAL HIGH (ref 70–99)

## 2019-10-23 LAB — BASIC METABOLIC PANEL
Anion gap: 11 (ref 5–15)
BUN: 50 mg/dL — ABNORMAL HIGH (ref 8–23)
CO2: 40 mmol/L — ABNORMAL HIGH (ref 22–32)
Calcium: 9 mg/dL (ref 8.9–10.3)
Chloride: 87 mmol/L — ABNORMAL LOW (ref 98–111)
Creatinine, Ser: 1.61 mg/dL — ABNORMAL HIGH (ref 0.44–1.00)
GFR calc Af Amer: 36 mL/min — ABNORMAL LOW (ref >60)
GFR calc non Af Amer: 31 mL/min — ABNORMAL LOW (ref >60)
Glucose, Bld: 120 mg/dL — ABNORMAL HIGH (ref 70–99)
Potassium: 3.8 mmol/L (ref 3.5–5.1)
Sodium: 138 mmol/L (ref 135–145)

## 2019-10-23 LAB — COOXEMETRY PANEL
Carboxyhemoglobin: 1.7 % — ABNORMAL HIGH (ref 0.5–1.5)
Methemoglobin: 0.7 % (ref 0.0–1.5)
O2 Saturation: 65.2 %
Total hemoglobin: 8.4 g/dL — ABNORMAL LOW (ref 12.0–16.0)

## 2019-10-23 MED ORDER — ALTEPLASE 2 MG IJ SOLR
2.0000 mg | Freq: Once | INTRAMUSCULAR | Status: AC
  Administered 2019-10-23: 2 mg
  Filled 2019-10-23: qty 2

## 2019-10-23 MED ORDER — TORSEMIDE 20 MG PO TABS
80.0000 mg | ORAL_TABLET | Freq: Two times a day (BID) | ORAL | Status: DC
  Administered 2019-10-23 – 2019-10-25 (×4): 80 mg via ORAL
  Filled 2019-10-23 (×4): qty 4

## 2019-10-23 MED ORDER — TORSEMIDE 20 MG PO TABS
20.0000 mg | ORAL_TABLET | Freq: Once | ORAL | Status: AC
  Administered 2019-10-23: 20 mg via ORAL
  Filled 2019-10-23: qty 1

## 2019-10-23 MED ORDER — MUPIROCIN 2 % EX OINT
1.0000 "application " | TOPICAL_OINTMENT | Freq: Two times a day (BID) | CUTANEOUS | Status: DC
  Administered 2019-10-24 (×2): 1 via NASAL
  Filled 2019-10-23: qty 22

## 2019-10-23 NOTE — Progress Notes (Signed)
Occupational Therapy Treatment Patient Details Name: [name] MRN: [id number] DOB: 04/09/1946 Today's Date: 10/23/2019    History of present illness 73 y.o. female with morbid obesity, chronic diastolic HF, CKD 3, DM2, pulmonary hypertension, permanent atrial fibrillation, chronic hypoxic respiratory failure on home O2 who is transferred from [facility] 11/12 for further management of a/c diastolic HF, PAH and AKI.   OT comments  Pt making good progress towards OT goals this session. Pt able to complete toilet transfer via functional mobility with RW with close min guard assist, total A for posterior pericare and supervision for standing grooming at sink. Maybe able to update DC rec to [facility] with continued progress. Will continue to follow acutely and update DC recs as needed.    Follow Up Recommendations  SNF    Equipment Recommendations  3 in 1 bedside commode;Wheelchair (measurements OT);Wheelchair cushion (measurements OT);Hospital bed    Recommendations for Other Services      Precautions / Restrictions Precautions Precautions: Fall Precaution Comments: oxygen required Restrictions Weight Bearing Restrictions: No       Mobility Bed Mobility Overal bed mobility: (pt received up in recliner)             General bed mobility comments: oob in chair  Transfers Overall transfer level: Needs assistance Equipment used: Rolling walker (2 wheeled) Transfers: Sit to/from Stand Sit to Stand: Min guard;Min assist         General transfer comment: pt required MIN A for sit>stand from [facility] over regular toilet with RW and cues to use grab bars, MIN guard from recliner x2 with RW    Balance Overall balance assessment: Needs assistance Sitting-balance support: No upper extremity supported;Feet supported Sitting balance-Leahy Scale: Good Sitting balance - Comments: close supervision   Standing balance support: No upper extremity supported;During functional  activity Standing balance-Leahy Scale: Good Standing balance comment: close supervision when standing at sink for hand hygiene                           ADL either performed or assessed with clinical judgement   ADL Overall ADL's : Needs assistance/impaired     Grooming: Wash/dry hands;Standing;Supervision/safety           Upper Body Dressing : Minimal assistance;Standing Upper Body Dressing Details (indicate cue type and reason): hospital gown     Toilet Transfer: Min guard;Ambulation;RW;BSC;Regular Toilet;Cueing for sequencing Toilet Transfer Details (indicate cue type and reason): BSC over regular toilet,cues for sequencing navigatng narrow bathroom Toileting- Clothing Manipulation and Hygiene: Total assistance;Sit to/from stand Toileting - Clothing Manipulation Details (indicate cue type and reason): total A for posterior pericare   Tub/Shower Transfer Details (indicate cue type and reason): reports seat in walkin shower Functional mobility during ADLs: Min guard;Rolling walker General ADL Comments: pt making progress towards OT goals this session, MIN Guard for toilet transfer via functional mobility with RW, total A for posterior pericare, supervision for standing grooming.Pt reports daughter handles most IADLs in the house     Vision Baseline Vision/History: Wears glasses Wears Glasses: At all times     Perception     Praxis      Cognition Arousal/Alertness: Awake/alert Behavior During Therapy: [facility] for tasks assessed/performed Overall Cognitive Status: Within Functional Limits for tasks assessed  Exercises     Shoulder Instructions       General Comments pt desaturating to 86% on 2L during functional mobility, pt sats increase to 93% on 3L [place] during functional mobility    Pertinent Vitals/ Pain       Pain Assessment: No/denies pain  Home Living                                           Prior Functioning/Environment              Frequency  Min 2X/week        Progress Toward Goals  OT Goals(current goals can now be found in the care plan section)  Progress towards OT goals: Progressing toward goals  Acute Rehab OT Goals Patient Stated Goal: To get back to my babies OT Goal Formulation: With patient Time For Goal Achievement: 11/01/19 Potential to Achieve Goals: Good  Plan Discharge plan remains appropriate    Co-evaluation    PT/OT/SLP Co-Evaluation/Treatment: Yes Reason for Co-Treatment: For patient/therapist safety;To address functional/ADL transfers   OT goals addressed during session: ADL's and self-care;Proper use of Adaptive equipment and DME      AM-PAC OT "6 Clicks" Daily Activity     Outcome Measure   Help from another person eating meals?: None Help from another person taking care of personal grooming?: A Little Help from another person toileting, which includes using toliet, bedpan, or urinal?: A Little Help from another person bathing (including washing, rinsing, drying)?: A Little Help from another person to put on and taking off regular upper body clothing?: A Little Help from another person to put on and taking off regular lower body clothing?: A Lot 6 Click Score: 18    End of Session Equipment Utilized During Treatment: Oxygen;Rolling walker  OT Visit Diagnosis: Unsteadiness on feet (R26.81);Muscle weakness (generalized) (M62.81)   Activity Tolerance Patient tolerated treatment well   Patient Left in chair;with call bell/phone within reach;with chair alarm set   Nurse Communication          Time: 2952-8413 OT Time Calculation (min): 32 min  Charges: OT General Charges $OT Visit: 1 Visit OT Treatments $Self Care/Home Management : 8-22 mins  Lanier Clam., COTA/L Acute Rehabilitation Services [phone] [place] 10/23/2019, 1:09 PM

## 2019-10-23 NOTE — Plan of Care (Signed)

## 2019-10-23 NOTE — Progress Notes (Signed)
Physical Therapy Treatment Patient Details Name: [name] MRN: [id number] DOB: 1946/03/05 Today's Date: 10/23/2019    History of Present Illness 73 y.o. female with morbid obesity, chronic diastolic HF, CKD 3, DM2, pulmonary hypertension, permanent atrial fibrillation, chronic hypoxic respiratory failure on home O2 who is transferred from [facility] 11/12 for further management of a/c diastolic HF, PAH and AKI.    PT Comments    Pt tolerated treatment well, demonstrating much improved transfers and ambulation with less physical assistance requirements from PT. Pt tolerating increased functional mobility during session, and able to tolerate multiple ADLs during session. Pt continues to demonstrate impaired endurance, power, balance, and gait compared to baseline and will benefit from continued acute PT services to aide in a return to her PLOF. PT now recommending home with home health PT as pt mobility is much improved and pt is progressing quickly.  Follow Up Recommendations  Home health PT;Supervision/Assistance - 24 hour     Equipment Recommendations  None recommended by PT(pt owns necessary DME)    Recommendations for Other Services       Precautions / Restrictions Precautions Precautions: Fall Precaution Comments: oxygen required Restrictions Weight Bearing Restrictions: No    Mobility  Bed Mobility Overal bed mobility: (pt received up in recliner)                Transfers Overall transfer level: Needs assistance Equipment used: Rolling walker (2 wheeled) Transfers: Sit to/from Stand Sit to Stand: Min guard;Min assist(minA from elevated commode, minG from recliner)            Ambulation/Gait Ambulation/Gait assistance: Min guard Gait Distance (Feet): 20 Feet(3 trials, 10', 20', 15') Assistive device: Rolling walker (2 wheeled) Gait Pattern/deviations: Step-to pattern;Wide base of support;Trunk flexed Gait velocity: reduced Gait velocity  interpretation: <1.8 ft/sec, indicate of risk for recurrent falls General Gait Details: pt with short step to gait, reduced foot clearance bilaterally, widened BOS   Stairs             Wheelchair Mobility    Modified Rankin (Stroke Patients Only)       Balance Overall balance assessment: Needs assistance Sitting-balance support: No upper extremity supported;Feet supported Sitting balance-Leahy Scale: Good     Standing balance support: No upper extremity supported;During functional activity Standing balance-Leahy Scale: Good Standing balance comment: close supervision                            Cognition Arousal/Alertness: Awake/alert Behavior During Therapy: WFL for tasks assessed/performed Overall Cognitive Status: Within Functional Limits for tasks assessed                                        Exercises      General Comments General comments (skin integrity, edema, etc.): pt desaturating to 86% while mobilizing on 2L [place], pt sats improve to 93% on 3L [place] during mobility. Pt saturating at 96% at end of session while resting on 2L [place]      Pertinent Vitals/Pain Pain Assessment: No/denies pain    Home Living                      Prior Function            PT Goals (current goals can now be found in the care plan section) Acute Rehab PT Goals  Patient Stated Goal: To get back to my babies Progress towards PT goals: Progressing toward goals    Frequency    Min 3X/week      PT Plan Discharge plan needs to be updated;Frequency needs to be updated    Co-evaluation              AM-PAC PT "6 Clicks" Mobility   Outcome Measure  Help needed turning from your back to your side while in a flat bed without using bedrails?: A Little Help needed moving from lying on your back to sitting on the side of a flat bed without using bedrails?: A Little Help needed moving to and from a bed to a chair (including a wheelchair)?:  A Little Help needed standing up from a chair using your arms (e.g., wheelchair or bedside chair)?: A Little Help needed to walk in hospital room?: A Little Help needed climbing 3-5 steps with a railing? : A Lot 6 Click Score: 17    End of Session Equipment Utilized During Treatment: Oxygen Activity Tolerance: Patient tolerated treatment well Patient left: in chair;with call bell/phone within reach Nurse Communication: Mobility status PT Visit Diagnosis: Muscle weakness (generalized) (M62.81)     Time: 2552-5894 PT Time Calculation (min) (ACUTE ONLY): 30 min  Charges:  $Gait Training: 8-22 mins                     Zenaida Niece, PT, DPT Acute Rehabilitation Pager: [phone]    Zenaida Niece 10/23/2019, 12:19 PM

## 2019-10-23 NOTE — Progress Notes (Signed)
Patient ID: [name], female   DOB: May 02, 1946, 73 y.o.   MRN: [id number]    Advanced Heart Failure Rounding Note   Subjective:    She remains on milrinone 0.125 and torsemide 60 mg bid. Good diuresis yesterday, weight down again and I/Os negative.  Creatinine better today at 1.6, but CVP higher at 16.  Breathing well, no SOB or CP, on 4L [place], home supplemental O2 2L. Co-ox 65%.    She was able to walk to bathroom yesterday.   Initial RHC numbers  RA = 19 RV = 84/19 PA = 92/28 (48) PCW = 22 Fick cardiac output/index = 7.7/3.1 Thermo CO/CI = 4.6/1.9 PVR = 5.5 WU (thermo) Ao sat = 99% PA sat = 65%, 67%  Objective:   Weight Range:  Vital Signs:   Temp:  [97.7 F (36.5 C)-98.6 F (37 C)] 98.5 F (36.9 C) (11/22 0300) Pulse Rate:  [79-86] 81 (11/22 0300) Resp:  [19-25] 21 (11/22 0300) BP: (112-141)/(39-48) 112/48 (11/22 0300) SpO2:  [92 %-98 %] 92 % (11/22 0300) Weight:  [129 kg-131.7 kg] 129 kg (11/22 0605) Last BM Date: 10/22/19  Weight change: Filed Weights   10/22/19 0336 10/22/19 1040 10/23/19 0605  Weight: 133.5 kg 131.7 kg 129 kg    Intake/Output:   Intake/Output Summary (Last 24 hours) at 10/23/2019 0823 Last data filed at 10/23/2019 0300 Gross per 24 hour  Intake 651.22 ml  Output 2275 ml  Net -1623.78 ml     Physical Exam: General: NAD Neck: Thick, JVP difficult, no thyromegaly or thyroid nodule.  Lungs: Clear to auscultation bilaterally with normal respiratory effort. CV: Nonpalpable PMI.  Heart irregular S1/S2, no S3/S4, no murmur.  1+ edema to knees.   Abdomen: Soft, nontender, no hepatosplenomegaly, no distention.  Skin: Intact without lesions or rashes.  Neurologic: Alert and oriented x 3.  Psych: Normal affect. Extremities: No clubbing or cyanosis.  HEENT: Normal.   Telemetry: Atrial fibrillation 90s. Personally reviewed   Labs: Basic Metabolic Panel: Recent Labs  Lab 10/17/19 0309  10/19/19 0405 10/20/19 0315 10/21/19  0600 10/22/19 0349 10/23/19 0340  NA 136   < > 138 131* 135 136 138  K 4.0   < > 4.0 3.9 4.3 3.8 3.8  CL 100   < > 95* 89* 90* 89* 87*  CO2 27   < > 30 31 33* 37* 40*  GLUCOSE 157*   < > 147* 283* 187* 137* 120*  BUN 58*   < > 57* 52* 53* 53* 50*  CREATININE 1.69*   < > 1.30* 1.36* 1.54* 1.72* 1.61*  CALCIUM 8.6*   < > 9.1 8.7* 9.1 9.0 9.0  MG 2.0  --  1.7 2.1 2.1  --   --    < > = values in this interval not displayed.    Liver Function Tests: Recent Labs  Lab 10/21/19 0600  AST 20  ALT 14  ALKPHOS 144*  BILITOT 1.3*  PROT 6.0*  ALBUMIN 2.7*   No results for input(s): LIPASE, AMYLASE in the last 168 hours. No results for input(s): AMMONIA in the last 168 hours.  CBC: Recent Labs  Lab 10/18/19 0604 10/19/19 0405 10/20/19 0315 10/21/19 0600 10/22/19 0349  WBC 6.6 7.4 6.4 6.9 6.5  HGB 9.3* 8.9* 7.7* 8.4* 8.6*  HCT 29.6* 28.4* 25.4* 26.9* 28.1*  MCV 98.7 97.6 100.4* 98.2 100.7*  PLT 123* 135* 142* 159 167    Cardiac Enzymes: No results for input(s): CKTOTAL, CKMB, CKMBINDEX,  TROPONINI in the last 168 hours.  BNP: BNP (last 3 results) Recent Labs    12/20/18 1700 06/22/19 1452 10/13/19 0018  BNP 212.5* 187.0* 571.9*    ProBNP (last 3 results) No results for input(s): PROBNP in the last 8760 hours.    Other results:  Imaging: No results found.   Medications:     Scheduled Medications: . apixaban  5 mg Oral BID  . Chlorhexidine Gluconate Cloth  6 each Topical Daily  . ferrous sulfate  325 mg Oral QHS  . gabapentin  300 mg Oral QHS  . insulin aspart  0-15 Units Subcutaneous TID WC  . insulin aspart  0-5 Units Subcutaneous QHS  . insulin detemir  8 Units Subcutaneous QHS  . levothyroxine  50 mcg Oral Q0600  . macitentan  10 mg Oral Daily  . mouth rinse  15 mL Mouth Rinse BID  . midodrine  10 mg Oral TID WC  . sildenafil  40 mg Oral TID  . sodium chloride flush  10-40 mL Intracatheter Q12H  . sodium chloride flush  3 mL Intravenous Q12H  .  spironolactone  12.5 mg Oral Daily  . torsemide  20 mg Oral Once  . torsemide  80 mg Oral BID    Infusions: . sodium chloride 10 mL/hr at 10/21/19 1500    PRN Medications: sodium chloride, acetaminophen, lip balm, ondansetron (ZOFRAN) IV, sodium chloride flush, sodium chloride flush   Assessment/Plan:   1. PAH with cor pulmonale, end-stage - RHC numbers as above - Likely multifactorial WHO Group, I,II,III - marked R-sided volume overload complicated by cardiorenal syndrome - started milrinone and lasix gtt 11/12, + metolazone 5 mg bid, +spiro 12.5 mg - co-ox 65% on milrinone 0.125. Creatinine lower today, will stop milrinone. - Continue sildenafil 40 mg tid, added macitentan 10 daily.   - Continue midodrine 10 mg tid.  - Good diuresis again yesterday and weight down, now on po torsemide.  Creatinine lower 1.74 => 1.61.  However, CVP up to 16 this morning.  She has been off IV Lasix and metolazone, will increase torsemide to 80 mg bid today and follow response.  Weight down considerably and diuresed on torsemide yesterday, but will have to see how she does today (?back to IV diuretics).   2. Acute on chronic diastolic heart failure with R>L HF - Echo 7/20 EF 60-65% RV reportedly normal with RVSP 85 - diuresis limited by hypotension and A/CKD - hypotension resolved - suspect significant component of cardiorenal syndrome, improving - Now on torsemide, will have to see how she does in terms of diuresis today as CVP is up to 16 from 12-13 yesterday.   3. 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 - 3.2 upon transfer. Creatinine 1.74 => 1.61 after stopping IV diuretics.  - Hold nephrotoxic agents - Stopping milrinone today.  - Renal u/s with severe bilateral cortical thinning - Likely not candidate for HD due to severe PAH   4. Hypotension - Improved with midodrine 10 mg tid. May be able to wean once off lasix - BP stable today.   5. Permanent AF - On  eliquis and cardizem at home.  - Continue Eliquis.  - Hold cardizem with low BP.  - current rate mid 90s - Monitor electrolytes and keep K and Mg stable while diuresing.   6. Chronic hypoxic respiratory failure on home O2 - non-smoker. Suspect OSA /OHS/PAH - ABG at OSH  7.30/49/83/97% (only mild CO2  retention)   7 Severe obesity (BMI >= 40)  -Body mass index is 55.54 kg/m.  8. Type 2 diabetes mellitus with nephropathy - Reports taking trulicity and 2u insulin daily at home - A1c 7.9 - Low-carb diet. Cover with SSI   9. Anemia of Chronic disease - consider Aranesp  10. Thrombocytopenia  - resolved.   11. Debility - PT recommending SNF. She refuses but would consider [facility].   Length of Stay: 6   Loralie Champagne, MD 10/23/2019, 8:23 AM

## 2019-10-24 LAB — BASIC METABOLIC PANEL
Anion gap: 10 (ref 5–15)
BUN: 52 mg/dL — ABNORMAL HIGH (ref 8–23)
CO2: 40 mmol/L — ABNORMAL HIGH (ref 22–32)
Calcium: 8.8 mg/dL — ABNORMAL LOW (ref 8.9–10.3)
Chloride: 85 mmol/L — ABNORMAL LOW (ref 98–111)
Creatinine, Ser: 1.92 mg/dL — ABNORMAL HIGH (ref 0.44–1.00)
GFR calc Af Amer: 29 mL/min — ABNORMAL LOW (ref >60)
GFR calc non Af Amer: 25 mL/min — ABNORMAL LOW (ref >60)
Glucose, Bld: 128 mg/dL — ABNORMAL HIGH (ref 70–99)
Potassium: 3.7 mmol/L (ref 3.5–5.1)
Sodium: 135 mmol/L (ref 135–145)

## 2019-10-24 LAB — COOXEMETRY PANEL
Carboxyhemoglobin: 1.8 % — ABNORMAL HIGH (ref 0.5–1.5)
Carboxyhemoglobin: 1.5 % (ref 0.5–1.5)
Methemoglobin: 0.2 % (ref 0.0–1.5)
Methemoglobin: 0.6 % (ref 0.0–1.5)
O2 Saturation: 98.6 %
O2 Saturation: 57.9 %
Total hemoglobin: 9 g/dL — ABNORMAL LOW (ref 12.0–16.0)
Total hemoglobin: 10.5 g/dL — ABNORMAL LOW (ref 12.0–16.0)

## 2019-10-24 LAB — GLUCOSE, CAPILLARY
Glucose-Capillary: 119 mg/dL — ABNORMAL HIGH (ref 70–99)
Glucose-Capillary: 172 mg/dL — ABNORMAL HIGH (ref 70–99)
Glucose-Capillary: 170 mg/dL — ABNORMAL HIGH (ref 70–99)
Glucose-Capillary: 164 mg/dL — ABNORMAL HIGH (ref 70–99)

## 2019-10-24 MED ORDER — POTASSIUM CHLORIDE CRYS ER 20 MEQ PO TBCR
20.0000 meq | EXTENDED_RELEASE_TABLET | Freq: Once | ORAL | Status: AC
  Administered 2019-10-24: 20 meq via ORAL
  Filled 2019-10-24: qty 1

## 2019-10-24 NOTE — Plan of Care (Signed)

## 2019-10-24 NOTE — Progress Notes (Signed)
Occupational Therapy Treatment Patient Details Name: [name] MRN: [id number] DOB: [birth year] Today's Date: 10/24/2019    History of present illness 73 y.o. female with morbid obesity, chronic diastolic HF, CKD 3, DM2, pulmonary hypertension, permanent atrial fibrillation, chronic hypoxic respiratory failure on home O2 who is transferred from [facility] 11/12 for further management of a/c diastolic HF, PAH and AKI.   OT comments  Patient progressing slowly towards OT goals. Pt requires mod assist for bed mobility, min guard to supervision for transfers from 3:1 commode and EOB (cueing for safety and hand placement), and mod assist for toileting.  Pt desaturates to 85% on 2L during activity, increased O2 to 3L to maintain >90% during activity--pt with no awareness of desaturation. Updated dc plan to [place], but recommend HHOT and aide to maximize independence and safety. To safely dc home, she will need 24/7 support and assist with her 2 foster kids from her family. Will follow acutely.    Follow Up Recommendations  Home health OT;Supervision/Assistance - 24 hour;Other (comment)(aide)    Equipment Recommendations  3 in 1 bedside commode;Wheelchair (measurements OT);Wheelchair cushion (measurements OT);Hospital bed    Recommendations for Other Services Other (comment)(SW/CM to help with home situation involving care of kids)    Precautions / Restrictions Precautions Precautions: Fall Precaution Comments: oxygen required Restrictions Weight Bearing Restrictions: No       Mobility Bed Mobility Overal bed mobility: Needs Assistance Bed Mobility: Supine to Sit     Supine to sit: HOB elevated;Mod assist     General bed mobility comments: support to manage LEs completely towards EOB and ascend trunk; cueing for technique and reliant on bed rails  Transfers Overall transfer level: Needs assistance Equipment used: Rolling walker (2 wheeled) Transfers: Sit to/from  Stand Sit to Stand: Min guard;Supervision         General transfer comment: min guard to close supervision with cueing for hand placement and safety, increased time and effort    Balance Overall balance assessment: Needs assistance Sitting-balance support: No upper extremity supported;Feet supported Sitting balance-Leahy Scale: Good Sitting balance - Comments: close supervision   Standing balance support: No upper extremity supported;During functional activity Standing balance-Leahy Scale: Good Standing balance comment: supervision statically, relaint on BUE support dynamically                           ADL either performed or assessed with clinical judgement   ADL Overall ADL's : Needs assistance/impaired     Grooming: Set up;Sitting;Oral care                   Toilet Transfer: Supervision/safety;Ambulation;BSC;RW;Regular Glass blower/designer Details (indicate cue type and reason): BSC over commode, cueing for safety  Toileting- Clothing Manipulation and Hygiene: Moderate assistance;Sitting/lateral lean Toileting - Clothing Manipulation Details (indicate cue type and reason): mod assist to ensure hygiene posteriorly      Functional mobility during ADLs: Min guard;Supervision/safety;Rolling walker General ADL Comments: pt progressing well towards goals     Vision       Perception     Praxis      Cognition Arousal/Alertness: Awake/alert Behavior During Therapy: WFL for tasks assessed/performed Overall Cognitive Status: Within Functional Limits for tasks assessed  Exercises     Shoulder Instructions       General Comments pt desaturating to 85% on 2L during activity, increased to 3L during activity with saturations up to 93%; left resting on 2L upon exit    Pertinent Vitals/ Pain       Pain Assessment: No/denies pain  Home Living                                           Prior Functioning/Environment              Frequency  Min 2X/week        Progress Toward Goals  OT Goals(current goals can now be found in the care plan section)  Progress towards OT goals: Progressing toward goals  Acute Rehab OT Goals Patient Stated Goal: To get back to my babies OT Goal Formulation: With patient  Plan Discharge plan needs to be updated;Frequency remains appropriate    Co-evaluation                 AM-PAC OT "6 Clicks" Daily Activity     Outcome Measure   Help from another person eating meals?: None Help from another person taking care of personal grooming?: A Little Help from another person toileting, which includes using toliet, bedpan, or urinal?: A Little Help from another person bathing (including washing, rinsing, drying)?: A Little Help from another person to put on and taking off regular upper body clothing?: A Little Help from another person to put on and taking off regular lower body clothing?: A Lot 6 Click Score: 18    End of Session Equipment Utilized During Treatment: Oxygen;Rolling walker  OT Visit Diagnosis: Unsteadiness on feet (R26.81);Muscle weakness (generalized) (M62.81)   Activity Tolerance Patient tolerated treatment well   Patient Left in chair;with call bell/phone within reach   Nurse Communication Mobility status;Precautions        Time: 6579-0383 OT Time Calculation (min): 32 min  Charges: OT General Charges $OT Visit: 1 Visit OT Treatments $Self Care/Home Management : 23-37 mins  Delight Stare, [place] Pager [phone] Office [phone]    Delight Stare 10/24/2019, 9:16 AM

## 2019-10-24 NOTE — Progress Notes (Addendum)
Patient ID: [name], female   DOB: 1946-06-12, 73 y.o.   MRN: [id number]    Advanced Heart Failure Rounding Note   Subjective:    On torsemide 80 mg bid. Net negative .5L with increase in weight today.   No SOB or CP. On home supplemental 2L. Co-ox 68% yesterday, 98% today likely incorrect. Ambulated in the hall yesterday, to and from bathroom.  CVP 16 again today.   Initial RHC numbers  RA = 19 RV = 84/19 PA = 92/28 (48) PCW = 22 Fick cardiac output/index = 7.7/3.1 Thermo CO/CI = 4.6/1.9 PVR = 5.5 WU (thermo) Ao sat = 99% PA sat = 65%, 67%  Objective:   Weight Range:  Vital Signs:   Temp:  [97.8 F (36.6 C)-98.6 F (37 C)] 98.5 F (36.9 C) (11/23 0344) Pulse Rate:  [63-100] 68 (11/23 0344) Resp:  [21-24] 21 (11/23 0344) BP: (98-124)/(43-54) 98/54 (11/23 0344) SpO2:  [91 %-96 %] 91 % (11/23 0344) Weight:  [130.1 kg] 130.1 kg (11/23 0344) Last BM Date: 10/22/19  Weight change: Filed Weights   10/22/19 1040 10/23/19 0605 10/24/19 0344  Weight: 131.7 kg 129 kg 130.1 kg    Intake/Output:   Intake/Output Summary (Last 24 hours) at 10/24/2019 0614 Last data filed at 10/23/2019 2300 Gross per 24 hour  Intake 566 ml  Output 1150 ml  Net -584 ml     Physical Exam: General: Sitting in bed. NAD, eating breakfast HEENT: normal anicteric  Neck: Thick, JVP up to ear, no thyromegaly or thyroid nodule.  Lungs: Dry base crackles, otherwise CTA  CV: Nonpalpable PMI.  Heart irregular S1/S2, no S3/S4, no murmur.  1+ edema to knees.   Abdomen: Obese Soft, nontender, no hepatosplenomegaly, no distention.  Extremities: no cyanosis, clubbing, rash, edema Neuro: alert & oriented x 3, cranial nerves grossly intact. moves all 4 extremities w/o difficulty. Affect pleasant   Telemetry: Atrial fibrillation 90s. Personally reviewed   Labs: Basic Metabolic Panel: Recent Labs  Lab 10/19/19 0405 10/20/19 0315 10/21/19 0600 10/22/19 0349 10/23/19 0340 10/24/19  0431  NA 138 131* 135 136 138 135  K 4.0 3.9 4.3 3.8 3.8 3.7  CL 95* 89* 90* 89* 87* 85*  CO2 30 31 33* 37* 40* 40*  GLUCOSE 147* 283* 187* 137* 120* 128*  BUN 57* 52* 53* 53* 50* 52*  CREATININE 1.30* 1.36* 1.54* 1.72* 1.61* 1.92*  CALCIUM 9.1 8.7* 9.1 9.0 9.0 8.8*  MG 1.7 2.1 2.1  --   --   --     Liver Function Tests: Recent Labs  Lab 10/21/19 0600  AST 20  ALT 14  ALKPHOS 144*  BILITOT 1.3*  PROT 6.0*  ALBUMIN 2.7*   No results for input(s): LIPASE, AMYLASE in the last 168 hours. No results for input(s): AMMONIA in the last 168 hours.  CBC: Recent Labs  Lab 10/18/19 0604 10/19/19 0405 10/20/19 0315 10/21/19 0600 10/22/19 0349  WBC 6.6 7.4 6.4 6.9 6.5  HGB 9.3* 8.9* 7.7* 8.4* 8.6*  HCT 29.6* 28.4* 25.4* 26.9* 28.1*  MCV 98.7 97.6 100.4* 98.2 100.7*  PLT 123* 135* 142* 159 167    Cardiac Enzymes: No results for input(s): CKTOTAL, CKMB, CKMBINDEX, TROPONINI in the last 168 hours.  BNP: BNP (last 3 results) Recent Labs    12/20/18 1700 06/22/19 1452 10/13/19 0018  BNP 212.5* 187.0* 571.9*    ProBNP (last 3 results) No results for input(s): PROBNP in the last 8760 hours.    Other results:  Imaging: No results found.   Medications:     Scheduled Medications: . apixaban  5 mg Oral BID  . Chlorhexidine Gluconate Cloth  6 each Topical Daily  . ferrous sulfate  325 mg Oral QHS  . gabapentin  300 mg Oral QHS  . insulin aspart  0-15 Units Subcutaneous TID WC  . insulin aspart  0-5 Units Subcutaneous QHS  . insulin detemir  8 Units Subcutaneous QHS  . levothyroxine  50 mcg Oral Q0600  . macitentan  10 mg Oral Daily  . mouth rinse  15 mL Mouth Rinse BID  . midodrine  10 mg Oral TID WC  . mupirocin ointment  1 application Nasal BID  . sildenafil  40 mg Oral TID  . sodium chloride flush  10-40 mL Intracatheter Q12H  . sodium chloride flush  3 mL Intravenous Q12H  . spironolactone  12.5 mg Oral Daily  . torsemide  80 mg Oral BID     Infusions: . sodium chloride 10 mL/hr at 10/21/19 1500    PRN Medications: sodium chloride, acetaminophen, lip balm, ondansetron (ZOFRAN) IV, sodium chloride flush, sodium chloride flush   Assessment/Plan:   1. PAH with cor pulmonale, end-stage - RHC numbers as above, likely multifactorial WHO Group, I,II,III - marked R-sided volume overload complicated by cardiorenal syndrome - started milrinone and lasix gtt 11/12, + metolazone 5 mg bid, +spiro 12.5 mg - co-ox 65% yesterday. Milrinone stopped two days ago, creatinine started to trend back up.  - Continue sildenafil 40 mg tid and macitentan 10 daily.   - Continue midodrine 10 mg tid.  - torsemide increased yesterday to 80 mg po yesterday but I/O only negative .5L, weight continuing to trend back up. Restart IV lasix today.  - BP soft today, may need to restart milrinone, MAPs 69, MAP goal >65 - start IS with dry crackles   2. Acute on chronic diastolic heart failure with R>L HF - Echo 7/20 EF 60-65% RV reportedly normal with RVSP 85 - diuresis limited by hypotension and A/CKD - hypotension resolved - suspect significant component of cardiorenal syndrome - Now on torsemide, will have to see how she does in terms of diuresis today as CVP is up to 16 from 12-13 yesterday. Restart IV lasix today.   3. 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 3.2 upon transfer to [facility]. Creatinine 1.61 > 1.9 after stopping IV diuretics two days ago and milrinone yesterday.  - Hold nephrotoxic agents - Renal u/s with severe bilateral cortical thinning - Likely not candidate for HD due to severe PAH   4. Hypotension - Improved with midodrine 10 mg tid, BP continues to be soft.  - MAP goal >65  5. Permanent AF - On eliquis and cardizem at home.  - Continue Eliquis.  - Hold cardizem with low BP.  - current rate mid 90s - Monitor electrolytes and keep K and Mg stable while diuresing.   6. Chronic hypoxic respiratory  failure on home O2 - non-smoker. Suspect OSA /OHS/PAH - ABG at OSH  7.30/49/83/97% (only mild CO2 retention)   7 Severe obesity (BMI >= 40)  -Body mass index is 55.54 kg/m.  8. Type 2 diabetes mellitus with nephropathy - Reports taking trulicity and 2u insulin daily at home - A1c 7.9 - Low-carb diet. Cover with SSI   9. Anemia of Chronic disease - consider Aranesp  10. Thrombocytopenia  - resolved.   11. Debility - PT recommending SNF. She  refuses but would consider [facility].   Length of Stay: [place], MD 10/24/2019, 6:14 AM  Patient seen and examined with the above-signed Advanced Practice Provider and/or Housestaff. I personally reviewed laboratory data, imaging studies and relevant notes. I independently examined the patient and formulated the important aspects of the plan. I have edited the note to reflect any of my changes or salient points. I have personally discussed the plan with the patient and/or family.  Now on po diuretics. Remains tenuous. Weight stable but creatinine rising. On combination therapy for severe PAH. Tolerating Eliquis for AF. Was able to ambulate some. Will continue to follow on current regimen. Refuses SNF.   Glori Bickers, MD  10:39 AM

## 2019-10-25 LAB — COOXEMETRY PANEL
Carboxyhemoglobin: 2 % — ABNORMAL HIGH (ref 0.5–1.5)
Methemoglobin: 1.1 % (ref 0.0–1.5)
O2 Saturation: 67 %
Total hemoglobin: 11.1 g/dL — ABNORMAL LOW (ref 12.0–16.0)

## 2019-10-25 LAB — BASIC METABOLIC PANEL
Anion gap: 13 (ref 5–15)
BUN: 53 mg/dL — ABNORMAL HIGH (ref 8–23)
CO2: 41 mmol/L — ABNORMAL HIGH (ref 22–32)
Calcium: 9.2 mg/dL (ref 8.9–10.3)
Chloride: 84 mmol/L — ABNORMAL LOW (ref 98–111)
Creatinine, Ser: 2.05 mg/dL — ABNORMAL HIGH (ref 0.44–1.00)
GFR calc Af Amer: 27 mL/min — ABNORMAL LOW (ref >60)
GFR calc non Af Amer: 23 mL/min — ABNORMAL LOW (ref >60)
Glucose, Bld: 169 mg/dL — ABNORMAL HIGH (ref 70–99)
Potassium: 3.9 mmol/L (ref 3.5–5.1)
Sodium: 138 mmol/L (ref 135–145)

## 2019-10-25 LAB — GLUCOSE, CAPILLARY
Glucose-Capillary: 150 mg/dL — ABNORMAL HIGH (ref 70–99)
Glucose-Capillary: 160 mg/dL — ABNORMAL HIGH (ref 70–99)

## 2019-10-25 MED ORDER — SILDENAFIL CITRATE 20 MG PO TABS: 40.0000 mg | ORAL_TABLET | Freq: Three times a day (TID) | ORAL | 6 refills | Status: DC

## 2019-10-25 MED ORDER — APIXABAN 5 MG PO TABS: 5.0000 mg | ORAL_TABLET | Freq: Two times a day (BID) | ORAL | 6 refills | Status: DC

## 2019-10-25 MED ORDER — SPIRONOLACTONE 25 MG PO TABS: 12.5000 mg | ORAL_TABLET | Freq: Every day | ORAL | 6 refills | Status: DC

## 2019-10-25 MED ORDER — MIDODRINE HCL 10 MG PO TABS: 10.0000 mg | ORAL_TABLET | Freq: Three times a day (TID) | ORAL | 6 refills | Status: DC

## 2019-10-25 MED ORDER — MACITENTAN 10 MG PO TABS: 10.0000 mg | ORAL_TABLET | Freq: Every day | ORAL | 6 refills | Status: DC

## 2019-10-25 NOTE — TOC Transition Note (Signed)
Transition of Care [facility]) - CM/SW Discharge Note   Patient Details  Name: [name] MRN: [id number] Date of Birth: 1946/06/25  [facility] CM/SW Contact:  Zenon Mayo, RN Phone Number: 10/25/2019, 11:07 AM   Clinical Narrative:    Patient is for discharge today, she has the 3 n 1 in her room.  NCM spoke with daughter , [name], she states patient does have a protalble concentrator that she has been using and it stays charged up.  NCM informed her that [employer] has been trying to reach her to switch out the home concentrator to a new one.  [name] will bring the portable concentrator when she picks up patient to go home.  Also [facility] was not able to take patient for [facility] services.  NCM asked if she would like to check with [facility] and she states yes, NCM spoke with Tommi Rumps with [facility] and he is able to take referral for [facility] PT, OT, and aide.     Final next level of care: [place] Barriers to Discharge: No Barriers Identified   Patient Goals and CMS Choice Patient states their goals for this hospitalization and ongoing recovery are:: get better CMS Medicare.gov Compare Post Acute Care list provided to:: Patient Choice offered to / list presented to : Patient  Discharge Placement                       Discharge Plan and Services In-house Referral: Clinical Social Work Discharge Planning Services: NA Post Acute Care Choice: Home Health          DME Arranged: Oxygen, 3-N-1 DME Agency: [employer] Date DME Agency Contacted: 10/25/19 Time DME Agency Contacted: 1107 Representative spoke with at DME Agency: [place]: RN, PT, OT, Nurse's Aide [place] Agency: [place] Date [place]: 10/25/19 Time [place]: 1107 Representative spoke with at [place]: [place] ([place]) Interventions     Readmission Risk Interventions No flowsheet data found.

## 2019-10-25 NOTE — Discharge Summary (Addendum)
Physician Discharge Summary  Patient ID: [name] MRN: [id number] DOB/AGE: May 10, 1946 73 y.o.  Admit date: 10/12/2019 Discharge date: 10/25/2019  Admission Diagnoses: Acute on Chronic Diastolic Heart Failure  Discharge Diagnoses:  Active Problems:   Acute on chronic diastolic (congestive) heart failure [facility])   Discharged Condition: stable  Hospital Course:   [name] is a 73 y.o.femalewith morbid obesity, chronic diastolic HF, CKD Stage III, DM2, pulmonary hypertension, permanent atrial fibrillation, chronic hypoxic respiratory failure on home O2 who was transferred from [facility] for further management of acute on chronic diastolic HF, pulmonary arterial hypertension and acute kidney injury.   Acute on Chronic Diastolic Heart Failure, right > left Cardiorenal Syndrome She presented with severe volume overload in addition to above symptoms. Previous echo in July showed EF 60-65% with normal RV and elevated RVSP of 85. Repeat echo after admission showed severe right ventricular dilation and decreased function with pressure overload. LV EF 65-70%. Right heart cath showed severe pulmonary arterial hypertension and cor pulmonale and severely reduced cardiac output.  [name] was placed to monitor pressures. She was diuresed with IV lasix gtt and metolazone. Milrinone was added for right ventricular support. Diuresis was limited by hypotension and acute renal failure secondary to cardiorenal syndrome. She was also treated with midodrine 10 mg tid and temporarily required vasopressor support with norepinephrine.  Renal function improved with diuresis and improvement in blood pressure, spironolactone was added. Milrinone, lasix and metolazone were stopped with improvement in blood pressure and renal function, and she was transitioned to torsemide 80 mg bid. Weight at discharge was 276 lbs, which is near prior discharge weight of 279 lbs in July. She was diuresed around 60 lbs. Per  patient, she thinks her baseline weight (EDW) is around 265 lbs. Further diuresis was limited by worsening renal function prior to discharge. CVP still mildly elevated but with severe PAH further diuresis was limited.  It was recommended she transition to SNF for rehabilitation, but she opted to return home due to family situation. She was discharged with spironolactone 12.5 mg qd and home dose of torsemide 60 mg qd. Home blood pressure medications, including losartan, were held at discharge due to decreased renal function and soft blood pressures. She will follow-up with the heart failure clinic in one week.   Pulmonary Arterial Hypertension  Cor Pulmonale ECHO after admission showed severe right ventricular dilation and decreased function with pressure overload. In addition to diuresis as above, she was treated with sildenafil. This was titrated up and then macitentan was added. She was discharged on sildenafil 40 mg tid and macitentan 10mg  qd.   Acute Renal Failure History of CKD Stage III. Creatinine 1.6 last July. 3.2 upon transfer to [facility]. Renal [place] without hydronephrosis but did show severe bilateral cortical thinning. AKI on CKD secondary to cardiorenal syndrome. Thought not to be a candidate for HD due to severe pulmonary arterial hypertension. Improved with diuresis. She was discharged with creatinine of 2. Metformin was discontinued at discharge. Will likely need to discontinue alendronate at follow-up.   Hypotension Treated with norepinephrine, milrinone, and midodrine 10 mg tid. Blood pressure improved with diuresis and treatment of her PAH. She was discharged with midodrine 10 mg tid.   Permanent Atrial Fibrillation She was treated with heparin during admission. Home medications include eliquis 2.5mg  and diltiazem. Diltiazem was held during admission due to hypotension and was discontinued at discharge. She was discharged on 5 mg eliquis as only indication for lower dose was her  decreased renal function.   Chronic Respiratory Failure Discharged on home 2L supplemental oxygen. Chronic respiratory failure thought to be secondary to OSA, OHS, and PAH.   TIIDM Metformin stopped at discharge due to reduced renal function.   Consults: None  Significant Diagnostic Studies:   Renal US 10/13/2019 IMPRESSION: Bilateral renal cortical thinning and renal cysts. No hydronephrosis. Electronically Signed   By: Prudencio Pair M.D.   On: 10/13/2019 03:47  ECHO 10/13/2019 1. Severe RV dilation and severely reduced function consistent with cor pulmonale. Septal flattening consistent with RV pressure overload present. Estimated PASP ~ 66 mmHG, which is moderately elevated.  2. Left ventricular ejection fraction, by visual estimation, is 65 to 70%. The left ventricle has normal function. There is no left ventricular hypertrophy.  3. Left ventricular diastolic function could not be evaluated.  4. Right ventricular pressure overload.  5. Small left ventricular internal cavity size.  6. Global right ventricle has severely reduced systolic function.The right ventricular size is severely enlarged. No increase in right ventricular wall thickness.  7. Left atrial size was normal.  8. Right atrial size was severely dilated.  9. Presence of pericardial fat pad. 10. Mild mitral annular calcification. 11. The mitral valve is degenerative. No evidence of mitral valve regurgitation. 12. The tricuspid valve is grossly normal. Tricuspid valve regurgitation is mild. 13. The aortic valve is tricuspid. Aortic valve regurgitation is not visualized. Mild aortic valve sclerosis without stenosis. 14. The pulmonic valve was grossly normal. Pulmonic valve regurgitation is mild. 15. Moderately elevated pulmonary artery systolic pressure. 16. The tricuspid regurgitant velocity is 3.58 m/s, and with an assumed right atrial pressure of 15 mmHg, the estimated right ventricular systolic pressure is  moderately elevated at 66.3 mmHg. 17. A venous catheter is visualized in the RA, RV and PA. 18. The inferior vena cava is dilated in size with <50% respiratory variability, suggesting right atrial pressure of 15 mmHg.  10/13/19 RHC Findings: RA = 19 RV = 84/19 PA = 92/28 (48) PCW = 22 Fick cardiac output/index = 7.7/3.1 Thermo CO/CI = 4.6/1.9 PVR = 5.5 WU (thermo) Ao sat = 99% PA sat = 65%, 67% Assessment: 1. 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 Mildly elevated PCWP 3. Moderate to severely reduced CO by Thermo   CXR 10/13/2019 IMPRESSION: 1. Cardiomegaly with bilateral pulmonary infiltrates again noted. Findings most consistent with pulmonary edema. Bilateral pleural effusions again noted. Findings consistent with CHF. Similar findings noted on prior exam.  2.  Persistent bibasilar atelectasis. Electronically Signed   By: Marcello Moores  Register   On: 10/13/2019 07:08  Treatments: cardiac meds: furosemide and torsemide, metolazone, anticoagulation: heparin and then eliquis, procedures: PICC line and Swan Ganz catheter.    Disposition: Discharge disposition: 01-Home or Self Care       Discharge Instructions    (HEART FAILURE PATIENTS) Call MD:  Anytime you have any of the following symptoms: 1) 3 pound weight gain in 24 hours or 5 pounds in 1 week 2) shortness of breath, with or without a dry hacking cough 3) swelling in the hands, feet or stomach 4) if you have to sleep on extra pillows at night in order to breathe.   Complete by: As directed    Diet - low sodium heart healthy   Complete by: As directed    Heart Failure patients record your daily weight using the same scale at the same time of day   Complete by: As directed    Increase activity slowly  Complete by: As directed      Allergies as of 10/25/2019      Reactions   Lisinopril    R/t to kidney   Codeine Nausea And Vomiting      Medication List    STOP taking these medications   diltiazem 180  MG 24 hr capsule Commonly known as: CARDIZEM CD   losartan 50 MG tablet Commonly known as: COZAAR   metFORMIN 750 MG 24 hr tablet Commonly known as: GLUCOPHAGE-XR   potassium chloride SA 20 MEQ tablet Commonly known as: KLOR-CON     TAKE these medications   acetaminophen 500 MG tablet Commonly known as: TYLENOL Take 500 mg by mouth every 4 (four) hours as needed for mild pain.   albuterol 108 (90 Base) MCG/ACT inhaler Commonly known as: VENTOLIN HFA Inhale 2 puffs into the lungs every 6 (six) hours as needed for wheezing or shortness of breath.   alendronate 70 MG tablet Commonly known as: FOSAMAX Take 1 tablet (70 mg total) by mouth every 7 (seven) days. What changed: when to take this Notes to patient: Follow your schedule   apixaban 5 MG Tabs tablet Commonly known as: ELIQUIS Take 1 tablet (5 mg total) by mouth 2 (two) times daily. What changed:   medication strength  how much to take Notes to patient: 10/25/19 10 pm   ferrous sulfate 325 (65 FE) MG tablet TAKE 1 TABLET BY MOUTH EVERY DAY WITH BREAKFAST What changed: See the new instructions. Notes to patient: 10/25/19 9 pm   fish oil-omega-3 fatty acids 1000 MG capsule Take 2 g by mouth daily. Notes to patient: 10/26/19   fluocinonide-emollient 0.05 % cream Commonly known as: LIDEX-E Apply 1 application topically 2 (two) times daily. To affected areas Notes to patient: 10/25/19 Evening   gabapentin 300 MG capsule Commonly known as: NEURONTIN 1 at bedtime for1 week then 2  The next  week then 3 the next week then 4 daily. What changed:   how much to take  how to take this  when to take this  additional instructions Notes to patient: 10/25/19 Bedtime   ipratropium-albuterol 0.5-2.5 (3) MG/3ML Soln Commonly known as: DUONEB Inhale 3 mLs into the lungs every 6 (six) hours as needed (FOR SHORTNESS OF BREATH).   Levemir FlexTouch 100 UNIT/ML Pen Generic drug: Insulin Detemir Inject 40 Units into  the skin 2 (two) times daily. What changed:   how much to take  when to take this Notes to patient: 10/25/19 9 pm   levothyroxine 50 MCG tablet Commonly known as: SYNTHROID Take 1 tablet (50 mcg total) by mouth daily.   macitentan 10 MG tablet Commonly known as: OPSUMIT Take 1 tablet (10 mg total) by mouth daily. Notes to patient: 10/26/19   midodrine 10 MG tablet Commonly known as: PROAMATINE Take 1 tablet (10 mg total) by mouth 3 (three) times daily with meals. Notes to patient: 10/25/19 Evening 5 pm   OXYGEN Inhale 2 L into the lungs continuous.   sildenafil 20 MG tablet Commonly known as: REVATIO Take 2 tablets (40 mg total) by mouth 3 (three) times daily. Notes to patient: 10/25/19 4 pm, 10 pm   spironolactone 25 MG tablet Commonly known as: ALDACTONE Take 0.5 tablets (12.5 mg total) by mouth daily. Notes to patient: 10/26/19   torsemide 20 MG tablet Commonly known as: DEMADEX Take 3 tablets (60 mg total) by mouth daily. Notes to patient: 10/26/19   trazodone 300 MG tablet Commonly known as: DESYREL  Take 1 tablet (300 mg total) by mouth at bedtime. Notes to patient: 63/14/97 Bedtime   Trulicity 0.26 VZ/8.5YI Sopn Generic drug: Dulaglutide INJECT 0.75 MG INTO THE SKIN ONCE A WEEK. What changed:   how much to take  how to take this  when to take this  additional instructions Notes to patient: Follow your schedule            Durable Medical Equipment  (From admission, onward)         Start     Ordered   10/24/19 1420  For home use only DME oxygen  Once    Comments: O2 sats at rest on room air 86% O2 sats on room air with exertion 85% O2 sat on 2 liters 93% while ambulating  Question Answer Comment  Length of Need Lifetime   Mode or (Route) Nasal cannula   Liters per Minute 2   Frequency Continuous (stationary and portable oxygen unit needed)   Oxygen conserving device Yes   Oxygen delivery system Gas      10/24/19 1421   10/24/19  1354  Heart failure home health orders  (Heart failure home health orders / Face to face)  Once    Comments: Heart Failure Follow-up Care:  Verify follow-up appointments per Patient Discharge Instructions. Confirm transportation arranged. Reconcile home medications with discharge medication list. Remove discontinued medications from use. Assist patient/caregiver to manage medications using pill box. Reinforce low sodium food selection Assessments: Vital signs and oxygen saturation at each visit. Assess home environment for safety concerns, caregiver support and availability of low-sodium foods. Consult Education officer, museum, PT/OT, Dietitian, and CNA based on assessments. Perform comprehensive cardiopulmonary assessment. Notify MD for any change in condition or weight gain of 3 pounds in one day or 5 pounds in one week with symptoms. Daily Weights and Symptom Monitoring: Ensure patient has access to scales. Teach patient/caregiver to weigh daily before breakfast and after voiding using same scale and record.    Teach patient/caregiver to track weight and symptoms and when to notify Provider. Activity: Develop individualized activity plan with patient/caregiver.   Question Answer Comment  Heart Failure Follow-up Care Advanced Heart Failure (AHF) Clinic at [phone]   Lab frequency Other see comments   Fax lab results to AHF Clinic at [phone]   Diet Low Sodium Heart Healthy   Fluid restrictions: 1800 mL Fluid      10/24/19 1354   10/22/19 1015  For home use only DME 3 n 1  Once    Comments: Bariatric   10/22/19 1015         Follow-up Information    [facility] Follow up on 11/03/2019.   Specialty: Cardiology Why: at 1000. Garage Code 0962 Contact information: [address] [id number] [place] [phone]       Care, [facility] Follow up.   Specialty: Home Health Services Why: HHRN,HHPT,  [place], HHAIDE Contact information: [place] [address] [phone]        [facility] Follow up.   Why: 3 n 1 is in room,  they will come to your home to switch out the home concentrator          Signed: Marty Heck 10/25/2019, 3:32 PM   Agree with above.   Patient seen and examined with the above-signed Advanced Practice Provider and/or Housestaff. I personally reviewed laboratory data, imaging studies and relevant notes. I independently examined  the patient and formulated the important aspects of the plan. I have edited the note to reflect any of my changes or salient points. I have personally discussed the plan with the patient and/or family.  CVP still up but I think that this is about as good as we can get her given severe PAH. I had a long talk with her about her situation and that I am not sure that we will be able to keep her volume status and renal function stable off inotropes but I do think there is a chance. Will send her home today and follow closely in HF Clinic. Meds reviewed closely with her.    Glori Bickers, MD  10:54 AM

## 2019-10-25 NOTE — Progress Notes (Signed)
Removed midline on Lt. Arm. Patient received discharge instructions. She understood it well. Pt took her all belongings with [facility]. HS [employer]

## 2019-10-25 NOTE — Consult Note (Signed)
   [facility] CM Inpatient Consult   10/25/2019  [name] 07-01-1946 [id number]    Update Note:  Noted change in discharge disposition for this [facility] Medicare patient.   Review of Inpatient [facility] team notes show that patient transitioned to home instead of SNF. Patient did not want to go to rehab and she wanted to go home. She lives with her granddaughter and her daughter comes over daily.  Per TOC CM note, patient was discharged home with [facility] PT, OT, and aide.  Plan: Will notify [facility] CM coordinator to follow-up community needs post discharge.    For questions and additional information, please call:  Muad Noga A. Ronique Simerly, BSN, RN-BC [facility] Liaison Cell: [phone]

## 2019-10-25 NOTE — Progress Notes (Addendum)
Patient ID: [name], female   DOB: 1946/02/15, 73 y.o.   MRN: [id number]    Advanced Heart Failure Rounding Note   Subjective:    On torsemide 80 mg bid. Net positive overnight. Weight unlikely to be accurate, will recheck.   No SOB or CP, urinating well, hoping to go home today. On home supplemental 2L. Co-ox 67%.   CVP yesterday 16. 14 today.  Creatinine up slightly. 1.9 -> 2.0   Initial RHC numbers  RA = 19 RV = 84/19 PA = 92/28 (48) PCW = 22 Fick cardiac output/index = 7.7/3.1 Thermo CO/CI = 4.6/1.9 PVR = 5.5 WU (thermo) Ao sat = 99% PA sat = 65%, 67%  Objective:   Weight Range:  Vital Signs:   Temp:  [97.6 F (36.4 C)-98.9 F (37.2 C)] 98.2 F (36.8 C) (11/24 0320) Pulse Rate:  [64-77] 69 (11/24 0320) Resp:  [17-21] 21 (11/24 0320) BP: (104-131)/(42-58) 107/46 (11/24 0320) SpO2:  [91 %-98 %] 97 % (11/24 0320) Weight:  [125 kg] 125 kg (11/24 0320) Last BM Date: 10/22/19  Weight change: Filed Weights   10/23/19 0605 10/24/19 0344 10/25/19 0320  Weight: 129 kg 130.1 kg 125 kg    Intake/Output:   Intake/Output Summary (Last 24 hours) at 10/25/2019 0605 Last data filed at 10/24/2019 2127 Gross per 24 hour  Intake 848 ml  Output 750 ml  Net 98 ml     Physical Exam:  General: no acute distress, sitting up in bed HEENT: normal anicteric  Neck: Thick, +JVP, no thyromegaly or thyroid nodule.  Lungs: dry crackles in bases, otherwise CTA  CV: Nonpalpable PMI. Irregular rate & rhythm.  1+ edema to knees.   Abdomen: Obese Soft, NTTP, no hepatosplenomegaly, no distention.  Extremities: no cyanosis, clubbing, rash, edema Neuro: alert & oriented x 3, cranial nerves grossly intact. moves all 4 extremities w/o difficulty. pleasant  Telemetry: atrial fibrillation in 70s. Bradycardia while asleep with 1.5s pauses   Labs: Basic Metabolic Panel: Recent Labs  Lab 10/19/19 0405 10/20/19 0315 10/21/19 0600 10/22/19 0349 10/23/19 0340 10/24/19 0431  10/25/19 0445  NA 138 131* 135 136 138 135 138  K 4.0 3.9 4.3 3.8 3.8 3.7 3.9  CL 95* 89* 90* 89* 87* 85* 84*  CO2 30 31 33* 37* 40* 40* 41*  GLUCOSE 147* 283* 187* 137* 120* 128* 169*  BUN 57* 52* 53* 53* 50* 52* 53*  CREATININE 1.30* 1.36* 1.54* 1.72* 1.61* 1.92* 2.05*  CALCIUM 9.1 8.7* 9.1 9.0 9.0 8.8* 9.2  MG 1.7 2.1 2.1  --   --   --   --     Liver Function Tests: Recent Labs  Lab 10/21/19 0600  AST 20  ALT 14  ALKPHOS 144*  BILITOT 1.3*  PROT 6.0*  ALBUMIN 2.7*   No results for input(s): LIPASE, AMYLASE in the last 168 hours. No results for input(s): AMMONIA in the last 168 hours.  CBC: Recent Labs  Lab 10/19/19 0405 10/20/19 0315 10/21/19 0600 10/22/19 0349  WBC 7.4 6.4 6.9 6.5  HGB 8.9* 7.7* 8.4* 8.6*  HCT 28.4* 25.4* 26.9* 28.1*  MCV 97.6 100.4* 98.2 100.7*  PLT 135* 142* 159 167    Cardiac Enzymes: No results for input(s): CKTOTAL, CKMB, CKMBINDEX, TROPONINI in the last 168 hours.  BNP: BNP (last 3 results) Recent Labs    12/20/18 1700 06/22/19 1452 10/13/19 0018  BNP 212.5* 187.0* 571.9*    ProBNP (last 3 results) No results for input(s): PROBNP in  the last 8760 hours.    Other results:  Imaging: No results found.   Medications:     Scheduled Medications: . apixaban  5 mg Oral BID  . Chlorhexidine Gluconate Cloth  6 each Topical Daily  . ferrous sulfate  325 mg Oral QHS  . gabapentin  300 mg Oral QHS  . insulin aspart  0-15 Units Subcutaneous TID WC  . insulin aspart  0-5 Units Subcutaneous QHS  . insulin detemir  8 Units Subcutaneous QHS  . levothyroxine  50 mcg Oral Q0600  . macitentan  10 mg Oral Daily  . mouth rinse  15 mL Mouth Rinse BID  . midodrine  10 mg Oral TID WC  . mupirocin ointment  1 application Nasal BID  . sildenafil  40 mg Oral TID  . sodium chloride flush  10-40 mL Intracatheter Q12H  . sodium chloride flush  3 mL Intravenous Q12H  . spironolactone  12.5 mg Oral Daily  . torsemide  80 mg Oral BID     Infusions: . sodium chloride 10 mL/hr at 10/21/19 1500    PRN Medications: sodium chloride, acetaminophen, lip balm, ondansetron (ZOFRAN) IV, sodium chloride flush, sodium chloride flush   Assessment/Plan:   1. PAH with cor pulmonale, end-stage - RHC numbers as above, likely multifactorial WHO Group, I,II,III - marked R-sided volume overload complicated by cardiorenal syndrome - started milrinone and lasix gtt 11/12 with metolazone 5 mg bid, now stopped - co-ox 67%. Milrinone stopped 11/21, creatinine trending back up.  - Continue sildenafil 40 mg tid and macitentan 10 daily.   - Continue midodrine 10 mg tid, blood pressure stable   - torsemide increased to 80 mg po bid, I/O net positive this morning . Wt down 11lbs today, but this is likely inaccurate.  - cont spiro 12.5 mg   2. Acute on chronic diastolic heart failure with R>L HF - Echo 7/20 EF 60-65% RV reportedly normal with RVSP 85 - diuresis initially limited by hypotension and A/CKD - hypotension resolved - suspect significant component of cardiorenal syndrome - Now on torsemide 80 mg po bid - decrease to 60mg  qd at discharge with Cr currently trending up - will need close follow-up in clinic   3. 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 3.2 upon transfer to [facility]. Creatinine trending up, now 2.0 after stopping IV diuretics.  - Hold nephrotoxic agents - Renal u/s with severe bilateral cortical thinning - Likely not candidate for HD due to severe PAH   4. Hypotension - Improved with midodrine 10 mg tid, BP continues to be soft but stable  - MAP goal >65  5. Permanent AF - On eliquis and cardizem at home.  - Continue Eliquis 5 mg qd - Holding cardizem with low BP.  - current rate mid 70s - Monitor electrolytes and keep K and Mg stable while diuresing.   6. Chronic hypoxic respiratory failure on home O2 - non-smoker. Suspect OSA /OHS/PAH - ABG at OSH  7.30/49/83/97% (only mild CO2  retention)   7 Severe obesity (BMI >= 40)  -Body mass index is 55.54 kg/m.  8. Type 2 diabetes mellitus with nephropathy - Reports taking trulicity and 2u insulin daily at home - A1c 7.9 - Low-carb diet. Cover with SSI   9. Anemia of Chronic disease - consider Aranesp  10. Thrombocytopenia  - resolved.   11. Debility - PT recommending SNF. She refuses but would consider [facility].   Length of  Stay: [place], MD 10/25/2019, 6:05 AM  Patient seen and examined with the above-signed Advanced Practice Provider and/or Housestaff. I personally reviewed laboratory data, imaging studies and relevant notes. I independently examined the patient and formulated the important aspects of the plan. I have edited the note to reflect any of my changes or salient points. I have personally discussed the plan with the patient and/or family.  CVP still up but I think that this is about as good as we can get her given severe PAH. I had a long talk with her about her situation and that I am not sure that we will be able to keep her volume status and renal function stable off inotropes but I do think there is a chance. Will send her home today and follow closely in HF Clinic. Meds reviewed closely with her.    Wendy Bickers, MD  10:54 AM

## 2019-10-26 ENCOUNTER — Ambulatory Visit: Payer: Medicare HMO | Admitting: *Deleted

## 2019-10-26 DIAGNOSIS — I5033 Acute on chronic diastolic (congestive) heart failure: Secondary | ICD-10-CM

## 2019-10-26 NOTE — Chronic Care Management (AMB) (Signed)
  Care Management   [facility] Discharge Note  10/26/2019 Name: [name] MRN: [id number] DOB: Oct 17, 1946  Patient admitted to [facility] on 10/12/2019 for acute on chronic diastolic heart failure and discharged on 10/25/2019. SNF was recommended at discharge but patient chose to return home where she lives with her granddaughter. Home Health services were ordered through [facility].   Cardiac follow-up has been scheduled. There was no recommendation at discharge to f/u with PCP at this time.   I reached out to Ms Moxley for a hospital follow-up call and to discuss Chronic Care Management services. I was unable to reach her by telephone or leave a message.   Follow up plan:  RN will reach out again over the next 30 days regarding CCM services  [facility] Clinical Staff will reach out as needed regarding hospital discharge   Chong Sicilian, BSN, RN-BC [facility] Management Direct Dial: [phone]

## 2019-10-26 NOTE — Patient Instructions (Signed)
  Follow up plan:  RN will reach out again over the next 30 days regarding CCM services  [facility] Clinical Staff will reach out as needed regarding hospital discharge   Chong Sicilian, BSN, RN-BC [facility] Management Direct Dial: [phone]

## 2019-10-28 DIAGNOSIS — E1122 Type 2 diabetes mellitus with diabetic chronic kidney disease: Secondary | ICD-10-CM | POA: Diagnosis not present

## 2019-10-28 DIAGNOSIS — I130 Hypertensive heart and chronic kidney disease with heart failure and stage 1 through stage 4 chronic kidney disease, or unspecified chronic kidney disease: Secondary | ICD-10-CM | POA: Diagnosis not present

## 2019-10-28 DIAGNOSIS — E1151 Type 2 diabetes mellitus with diabetic peripheral angiopathy without gangrene: Secondary | ICD-10-CM | POA: Diagnosis not present

## 2019-10-28 DIAGNOSIS — J449 Chronic obstructive pulmonary disease, unspecified: Secondary | ICD-10-CM | POA: Diagnosis not present

## 2019-10-28 DIAGNOSIS — D631 Anemia in chronic kidney disease: Secondary | ICD-10-CM | POA: Diagnosis not present

## 2019-10-28 DIAGNOSIS — I2510 Atherosclerotic heart disease of native coronary artery without angina pectoris: Secondary | ICD-10-CM | POA: Diagnosis not present

## 2019-10-28 DIAGNOSIS — I2721 Secondary pulmonary arterial hypertension: Secondary | ICD-10-CM | POA: Diagnosis not present

## 2019-10-28 DIAGNOSIS — N184 Chronic kidney disease, stage 4 (severe): Secondary | ICD-10-CM | POA: Diagnosis not present

## 2019-10-28 DIAGNOSIS — I5033 Acute on chronic diastolic (congestive) heart failure: Secondary | ICD-10-CM | POA: Diagnosis not present

## 2019-10-30 DIAGNOSIS — J449 Chronic obstructive pulmonary disease, unspecified: Secondary | ICD-10-CM | POA: Diagnosis not present

## 2019-10-30 DIAGNOSIS — I5033 Acute on chronic diastolic (congestive) heart failure: Secondary | ICD-10-CM | POA: Diagnosis not present

## 2019-10-30 DIAGNOSIS — N184 Chronic kidney disease, stage 4 (severe): Secondary | ICD-10-CM | POA: Diagnosis not present

## 2019-10-30 DIAGNOSIS — D631 Anemia in chronic kidney disease: Secondary | ICD-10-CM | POA: Diagnosis not present

## 2019-10-30 DIAGNOSIS — I2721 Secondary pulmonary arterial hypertension: Secondary | ICD-10-CM | POA: Diagnosis not present

## 2019-10-30 DIAGNOSIS — E1122 Type 2 diabetes mellitus with diabetic chronic kidney disease: Secondary | ICD-10-CM | POA: Diagnosis not present

## 2019-10-30 DIAGNOSIS — E1151 Type 2 diabetes mellitus with diabetic peripheral angiopathy without gangrene: Secondary | ICD-10-CM | POA: Diagnosis not present

## 2019-10-30 DIAGNOSIS — I2510 Atherosclerotic heart disease of native coronary artery without angina pectoris: Secondary | ICD-10-CM | POA: Diagnosis not present

## 2019-10-30 DIAGNOSIS — I130 Hypertensive heart and chronic kidney disease with heart failure and stage 1 through stage 4 chronic kidney disease, or unspecified chronic kidney disease: Secondary | ICD-10-CM | POA: Diagnosis not present

## 2019-11-01 DIAGNOSIS — I5033 Acute on chronic diastolic (congestive) heart failure: Secondary | ICD-10-CM | POA: Diagnosis not present

## 2019-11-01 DIAGNOSIS — I2721 Secondary pulmonary arterial hypertension: Secondary | ICD-10-CM | POA: Diagnosis not present

## 2019-11-01 DIAGNOSIS — D631 Anemia in chronic kidney disease: Secondary | ICD-10-CM | POA: Diagnosis not present

## 2019-11-01 DIAGNOSIS — N184 Chronic kidney disease, stage 4 (severe): Secondary | ICD-10-CM | POA: Diagnosis not present

## 2019-11-01 DIAGNOSIS — E1151 Type 2 diabetes mellitus with diabetic peripheral angiopathy without gangrene: Secondary | ICD-10-CM | POA: Diagnosis not present

## 2019-11-01 DIAGNOSIS — I2510 Atherosclerotic heart disease of native coronary artery without angina pectoris: Secondary | ICD-10-CM | POA: Diagnosis not present

## 2019-11-01 DIAGNOSIS — J449 Chronic obstructive pulmonary disease, unspecified: Secondary | ICD-10-CM | POA: Diagnosis not present

## 2019-11-01 DIAGNOSIS — E1122 Type 2 diabetes mellitus with diabetic chronic kidney disease: Secondary | ICD-10-CM | POA: Diagnosis not present

## 2019-11-01 DIAGNOSIS — I130 Hypertensive heart and chronic kidney disease with heart failure and stage 1 through stage 4 chronic kidney disease, or unspecified chronic kidney disease: Secondary | ICD-10-CM | POA: Diagnosis not present

## 2019-11-02 DIAGNOSIS — I2721 Secondary pulmonary arterial hypertension: Secondary | ICD-10-CM | POA: Diagnosis not present

## 2019-11-02 DIAGNOSIS — D631 Anemia in chronic kidney disease: Secondary | ICD-10-CM | POA: Diagnosis not present

## 2019-11-02 DIAGNOSIS — J449 Chronic obstructive pulmonary disease, unspecified: Secondary | ICD-10-CM | POA: Diagnosis not present

## 2019-11-02 DIAGNOSIS — E1122 Type 2 diabetes mellitus with diabetic chronic kidney disease: Secondary | ICD-10-CM | POA: Diagnosis not present

## 2019-11-02 DIAGNOSIS — E1151 Type 2 diabetes mellitus with diabetic peripheral angiopathy without gangrene: Secondary | ICD-10-CM | POA: Diagnosis not present

## 2019-11-02 DIAGNOSIS — N184 Chronic kidney disease, stage 4 (severe): Secondary | ICD-10-CM | POA: Diagnosis not present

## 2019-11-02 DIAGNOSIS — I2510 Atherosclerotic heart disease of native coronary artery without angina pectoris: Secondary | ICD-10-CM | POA: Diagnosis not present

## 2019-11-02 DIAGNOSIS — I5033 Acute on chronic diastolic (congestive) heart failure: Secondary | ICD-10-CM | POA: Diagnosis not present

## 2019-11-02 DIAGNOSIS — I130 Hypertensive heart and chronic kidney disease with heart failure and stage 1 through stage 4 chronic kidney disease, or unspecified chronic kidney disease: Secondary | ICD-10-CM | POA: Diagnosis not present

## 2019-11-03 ENCOUNTER — Ambulatory Visit (HOSPITAL_COMMUNITY)
Admission: RE | Admit: 2019-11-03 | Discharge: 2019-11-03 | Disposition: A | Discharge status: Home / Self Care | Payer: Medicare HMO | Source: Ambulatory Visit | Attending: Adult Health | Admitting: Adult Health

## 2019-11-03 ENCOUNTER — Encounter (HOSPITAL_COMMUNITY): Payer: Self-pay

## 2019-11-03 ENCOUNTER — Telehealth (HOSPITAL_COMMUNITY): Payer: Self-pay | Admitting: Cardiology

## 2019-11-03 ENCOUNTER — Other Ambulatory Visit: Payer: Self-pay

## 2019-11-03 VITALS — BP 153/62 | HR 93 | Wt 270.8 lb

## 2019-11-03 DIAGNOSIS — Z833 Family history of diabetes mellitus: Secondary | ICD-10-CM | POA: Insufficient documentation

## 2019-11-03 DIAGNOSIS — D631 Anemia in chronic kidney disease: Secondary | ICD-10-CM | POA: Diagnosis not present

## 2019-11-03 DIAGNOSIS — Z6841 Body Mass Index (BMI) 40.0 and over, adult: Secondary | ICD-10-CM | POA: Insufficient documentation

## 2019-11-03 DIAGNOSIS — Z794 Long term (current) use of insulin: Secondary | ICD-10-CM | POA: Insufficient documentation

## 2019-11-03 DIAGNOSIS — Z808 Family history of malignant neoplasm of other organs or systems: Secondary | ICD-10-CM | POA: Diagnosis not present

## 2019-11-03 DIAGNOSIS — Z8249 Family history of ischemic heart disease and other diseases of the circulatory system: Secondary | ICD-10-CM | POA: Diagnosis not present

## 2019-11-03 DIAGNOSIS — I5032 Chronic diastolic (congestive) heart failure: Secondary | ICD-10-CM | POA: Insufficient documentation

## 2019-11-03 DIAGNOSIS — J449 Chronic obstructive pulmonary disease, unspecified: Secondary | ICD-10-CM | POA: Diagnosis not present

## 2019-11-03 DIAGNOSIS — E1122 Type 2 diabetes mellitus with diabetic chronic kidney disease: Secondary | ICD-10-CM | POA: Diagnosis not present

## 2019-11-03 DIAGNOSIS — J9611 Chronic respiratory failure with hypoxia: Secondary | ICD-10-CM | POA: Insufficient documentation

## 2019-11-03 DIAGNOSIS — E1151 Type 2 diabetes mellitus with diabetic peripheral angiopathy without gangrene: Secondary | ICD-10-CM | POA: Diagnosis not present

## 2019-11-03 DIAGNOSIS — M7989 Other specified soft tissue disorders: Secondary | ICD-10-CM

## 2019-11-03 DIAGNOSIS — I130 Hypertensive heart and chronic kidney disease with heart failure and stage 1 through stage 4 chronic kidney disease, or unspecified chronic kidney disease: Secondary | ICD-10-CM | POA: Diagnosis not present

## 2019-11-03 DIAGNOSIS — Z825 Family history of asthma and other chronic lower respiratory diseases: Secondary | ICD-10-CM | POA: Insufficient documentation

## 2019-11-03 DIAGNOSIS — I4821 Permanent atrial fibrillation: Secondary | ICD-10-CM | POA: Insufficient documentation

## 2019-11-03 DIAGNOSIS — I5033 Acute on chronic diastolic (congestive) heart failure: Secondary | ICD-10-CM | POA: Diagnosis not present

## 2019-11-03 DIAGNOSIS — Z79899 Other long term (current) drug therapy: Secondary | ICD-10-CM | POA: Insufficient documentation

## 2019-11-03 DIAGNOSIS — Z7901 Long term (current) use of anticoagulants: Secondary | ICD-10-CM | POA: Diagnosis not present

## 2019-11-03 DIAGNOSIS — I2721 Secondary pulmonary arterial hypertension: Secondary | ICD-10-CM | POA: Insufficient documentation

## 2019-11-03 DIAGNOSIS — Z8673 Personal history of transient ischemic attack (TIA), and cerebral infarction without residual deficits: Secondary | ICD-10-CM | POA: Insufficient documentation

## 2019-11-03 DIAGNOSIS — M1990 Unspecified osteoarthritis, unspecified site: Secondary | ICD-10-CM | POA: Insufficient documentation

## 2019-11-03 DIAGNOSIS — G4733 Obstructive sleep apnea (adult) (pediatric): Secondary | ICD-10-CM | POA: Insufficient documentation

## 2019-11-03 DIAGNOSIS — E785 Hyperlipidemia, unspecified: Secondary | ICD-10-CM | POA: Insufficient documentation

## 2019-11-03 DIAGNOSIS — E1136 Type 2 diabetes mellitus with diabetic cataract: Secondary | ICD-10-CM | POA: Diagnosis not present

## 2019-11-03 DIAGNOSIS — I482 Chronic atrial fibrillation, unspecified: Secondary | ICD-10-CM

## 2019-11-03 DIAGNOSIS — D638 Anemia in other chronic diseases classified elsewhere: Secondary | ICD-10-CM | POA: Insufficient documentation

## 2019-11-03 DIAGNOSIS — Z800 Family history of malignant neoplasm of digestive organs: Secondary | ICD-10-CM | POA: Diagnosis not present

## 2019-11-03 DIAGNOSIS — I270 Primary pulmonary hypertension: Secondary | ICD-10-CM

## 2019-11-03 DIAGNOSIS — Z803 Family history of malignant neoplasm of breast: Secondary | ICD-10-CM | POA: Diagnosis not present

## 2019-11-03 DIAGNOSIS — I5022 Chronic systolic (congestive) heart failure: Secondary | ICD-10-CM | POA: Diagnosis not present

## 2019-11-03 DIAGNOSIS — Z79890 Hormone replacement therapy (postmenopausal): Secondary | ICD-10-CM | POA: Diagnosis not present

## 2019-11-03 DIAGNOSIS — N183 Chronic kidney disease, stage 3 unspecified: Secondary | ICD-10-CM | POA: Insufficient documentation

## 2019-11-03 DIAGNOSIS — N184 Chronic kidney disease, stage 4 (severe): Secondary | ICD-10-CM | POA: Diagnosis not present

## 2019-11-03 DIAGNOSIS — I2510 Atherosclerotic heart disease of native coronary artery without angina pectoris: Secondary | ICD-10-CM | POA: Insufficient documentation

## 2019-11-03 LAB — CBC
HCT: 31.5 % — ABNORMAL LOW (ref 36.0–46.0)
Hemoglobin: 9.5 g/dL — ABNORMAL LOW (ref 12.0–15.0)
MCH: 31 pg (ref 26.0–34.0)
MCHC: 30.2 g/dL (ref 30.0–36.0)
MCV: 102.9 fL — ABNORMAL HIGH (ref 80.0–100.0)
Platelets: 134 10*3/uL — ABNORMAL LOW (ref 150–400)
RBC: 3.06 MIL/uL — ABNORMAL LOW (ref 3.87–5.11)
RDW: 15.7 % — ABNORMAL HIGH (ref 11.5–15.5)
WBC: 5.1 10*3/uL (ref 4.0–10.5)
nRBC: 0 % (ref 0.0–0.2)

## 2019-11-03 LAB — BASIC METABOLIC PANEL
Anion gap: 14 (ref 5–15)
BUN: 42 mg/dL — ABNORMAL HIGH (ref 8–23)
CO2: 35 mmol/L — ABNORMAL HIGH (ref 22–32)
Calcium: 9.3 mg/dL (ref 8.9–10.3)
Chloride: 92 mmol/L — ABNORMAL LOW (ref 98–111)
Creatinine, Ser: 2.17 mg/dL — ABNORMAL HIGH (ref 0.44–1.00)
GFR calc Af Amer: 25 mL/min — ABNORMAL LOW (ref >60)
GFR calc non Af Amer: 22 mL/min — ABNORMAL LOW (ref >60)
Glucose, Bld: 166 mg/dL — ABNORMAL HIGH (ref 70–99)
Potassium: 3.2 mmol/L — ABNORMAL LOW (ref 3.5–5.1)
Sodium: 141 mmol/L (ref 135–145)

## 2019-11-03 LAB — BRAIN NATRIURETIC PEPTIDE: B Natriuretic Peptide: 316.5 pg/mL — ABNORMAL HIGH (ref 0.0–100.0)

## 2019-11-03 MED ORDER — POTASSIUM CHLORIDE CRYS ER 20 MEQ PO TBCR: 20.0000 meq | EXTENDED_RELEASE_TABLET | Freq: Every day | ORAL | 3 refills | Status: DC

## 2019-11-03 MED ORDER — TORSEMIDE 20 MG PO TABS: 40.0000 mg | ORAL_TABLET | Freq: Every day | ORAL | 3 refills | Status: DC

## 2019-11-03 NOTE — Telephone Encounter (Signed)
Notes recorded by Kerry Dory, CMA on 11/03/2019 at 3:53 PM EST  Patient aware.   ------   Notes recorded by Conrad Magnolia, NP on 11/03/2019 at 12:49 PM EST  Please call. Instruct to hold torsemide for 1 day then start torsemide 40 mg daily and add 20 meq potassium daily.

## 2019-11-03 NOTE — Addendum Note (Signed)
Encounter addended by: Jorge Ny, LCSW on: 11/03/2019 12:20 PM  Actions taken: Clinical Note Signed

## 2019-11-03 NOTE — Progress Notes (Signed)
CSW consulted to speak with pt regarding food concerns.  Pt currently living with several family members and foster children and food has been difficult to afford.  Pt hopeful that this will get better in the new year when some of the family is supposed to move out but it has been difficult.  CSW provided [employer] gift card to patient to assist with some immediate grocery needs and signed patient up for [place] program to help her with her food concerns.  CSW will continue to follow and assist as needed  Jorge Ny, [facility] Desk#: [phone] Cell#: [phone]

## 2019-11-03 NOTE — Patient Instructions (Signed)
It was great to see you today! No medication changes are needed at this time.  Labs today We will only contact you if something comes back abnormal or we need to make some changes. Otherwise no news is good news!  Your physician recommends that you schedule a follow-up appointment in: 4 weeks with Dr Haroldine Laws  Do the following things EVERYDAY: 1) Weigh yourself in the morning before breakfast. Write it down and keep it in a log. 2) Take your medicines as prescribed 3) Eat low salt foods-Limit salt (sodium) to 2000 mg per day.  4) Stay as active as you can everyday 5) Limit all fluids for the day to less than 2 liters  At the [facility], you and your health needs are our priority. As part of our continuing mission to provide you with exceptional heart care, we have created designated Provider Care Teams. These Care Teams include your primary Cardiologist (physician) and Advanced Practice Providers (APPs- Physician Assistants and Nurse Practitioners) who all work together to provide you with the care you need, when you need it.   You may see any of the following providers on your designated Care Team at your next follow up: Marland Kitchen Dr Glori Bickers . Dr Loralie Champagne . Darrick Grinder, NP . Lyda Jester, PA . Audry Riles, PharmD   Please be sure to bring in all your medications bottles to every appointment.

## 2019-11-03 NOTE — Progress Notes (Addendum)
PCP: Dr Livia Snellen  Cardiology: Dr Percival Spanish Primary HF Cardiologist: Dr Haroldine Laws   HPI: Ms. Schiltz is a89 y.o.femalewithmorbid obesity, chronic diastolic HF, CKD Stage III, DM2,pulmonary hypertension, permanentatrial fibrillation,chronic hypoxic respiratory failure on home O2 .  Admitted to [facility] from [facility] for further management of acute on chronic diastolic HF, pulmonary arterial hypertension and acute kidney injury. HF team admitted. Initially on Norepi but later weaned off.  Diuresed with IV lasix + milrinone. Once diuresed had RHC with mod-severe PAH and mod-severely reduced CO. Discharged on 10 mg midodrine tid due to hypotension.  Placed on sildenafil + macitentan and continued on home oxygen. Discharged with [place]. Discharge weight 276 pounds.   Sildenafil + Macitentan patient assistance approved until 11/30/2020.   Today she returns for HF follow up. Not wearing oxygen today. Overall feeling ok. Having a hard time getting food and paying her bills at home. Remains SOB with exertion. Denies PND/Orthopnea. Denies dizziness. Denies syncope.  Sleeps in a lift chair. Appetite ok. No fever or chills. Weight at home 271  pounds. Taking all medications. Followed by [facility]. Lives with her daughter and 2 foster children.   [place] 10/13/19  RA = 19 RV = 84/19 PA = 92/28 (48) PCW = 22 Fick cardiac output/index = 7.7/3.1 Thermo CO/CI = 4.6/1.9 PVR = 5.5 WU (thermo) Ao sat = 99% PA sat = 65%, 67% Assessment: 1. 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 Mildly elevated PCWP 3. Moderate to severely reduced CO by Thermo ROS: All systems negative except as listed in HPI, PMH and Problem List.  SH:  Social History   Socioeconomic History  . Marital status: Widowed    Spouse name: Not on file  . Number of children: 1  . Years of education: 93th  . Highest education level: 7th grade  Occupational History  . Occupation: Disabled  . Occupation: Retired    Comment: [employer]  . Financial resource strain: Not very hard  . Food insecurity    Worry: Never true    Inability: Never true  . Transportation needs    Medical: No    Non-medical: No  Tobacco Use  . Smoking status: Never Smoker  . Smokeless tobacco: Never Used  Substance and Sexual Activity  . Alcohol use: No  . Drug use: No  . Sexual activity: Not Currently  Lifestyle  . Physical activity    Days per week: 0 days    Minutes per session: 0 min  . Stress: Not at all  Relationships  . Social connections    Talks on phone: More than three times a week    Gets together: More than three times a week    Attends religious service: 1 to 4 times per year    Active member of club or organization: Yes    Attends meetings of clubs or organizations: More than 4 times per year    Relationship status: Widowed  . Intimate partner violence    Fear of current or ex partner: No    Emotionally abused: No    Physically abused: No    Forced sexual activity: No  Other Topics Concern  . Not on file  Social History Narrative  . Not on file    FH:  Family History  Problem Relation Age of Onset  . Colon cancer Mother   . Heart attack Mother   . Alzheimer's disease Mother        father  .  Diabetes Mother   . Heart disease Mother   . Cancer Mother        colon  . Kidney disease Mother   . Cancer Sister        brain and spine  . Migraines Sister   . Alzheimer's disease Father   . Cancer Brother        stomach  . Diabetes Sister   . Cancer Sister        breast  . Heart disease Sister   . Heart attack Sister   . Breast cancer Sister   . Heart disease Brother   . Heart murmur Brother   . Diabetes Other   . Heart attack Other   . Diabetes Daughter   . Asthma Daughter   . Asthma Grandchild     Past Medical History:  Diagnosis Date  . Anemia   . Arthritis   . Asthma   . Atrial fibrillation ([place])   . CAD (coronary artery disease)    LAD 60% proximal stenosis mid  and distal 60% stenosis 2009.  . Cataract    had surgery  . CHF (congestive heart failure) ([place])   . COPD (chronic obstructive pulmonary disease) ([place])   . Diabetes mellitus   . Gait instability    uses cane  . GERD (gastroesophageal reflux disease)   . Hyperlipidemia   . Hypertension   . Obesity   . Oxygen dependent    2 liter per nasal cannula  . Sleep apnea, obstructive 2008  . Stroke [facility])    2 mini    Current Outpatient Medications  Medication Sig Dispense Refill  . acetaminophen (TYLENOL) 500 MG tablet Take 500 mg by mouth every 4 (four) hours as needed for mild pain.     Marland Kitchen albuterol (PROVENTIL HFA;VENTOLIN HFA) 108 (90 Base) MCG/ACT inhaler Inhale 2 puffs into the lungs every 6 (six) hours as needed for wheezing or shortness of breath. 1 Inhaler 5  . alendronate (FOSAMAX) 70 MG tablet Take 1 tablet (70 mg total) by mouth every 7 (seven) days. 4 tablet 5  . apixaban (ELIQUIS) 5 MG TABS tablet Take 1 tablet (5 mg total) by mouth 2 (two) times daily. 60 tablet 6  . Dulaglutide (TRULICITY) 7.03 JK/0.9FG SOPN INJECT 0.75 MG INTO THE SKIN ONCE A WEEK. 4 pen 5  . ferrous sulfate 325 (65 FE) MG tablet TAKE 1 TABLET BY MOUTH EVERY DAY WITH BREAKFAST 90 tablet 0  . fish oil-omega-3 fatty acids 1000 MG capsule Take 2 g by mouth daily.      . fluocinonide-emollient (LIDEX-E) 0.05 % cream Apply 1 application topically 2 (two) times daily. To affected areas 60 g 5  . gabapentin (NEURONTIN) 300 MG capsule 1 at bedtime for1 week then 2  The next  week then 3 the next week then 4 daily. 120 capsule 0  . Insulin Detemir (LEVEMIR FLEXTOUCH) 100 UNIT/ML Pen Inject 40 Units into the skin 2 (two) times daily. 30 pen 3  . ipratropium-albuterol (DUONEB) 0.5-2.5 (3) MG/3ML SOLN Inhale 3 mLs into the lungs every 6 (six) hours as needed (FOR SHORTNESS OF BREATH).   0  . levothyroxine (SYNTHROID) 50 MCG tablet Take 1 tablet (50 mcg total) by mouth daily. 90 tablet 1  . macitentan (OPSUMIT) 10 MG tablet  Take 1 tablet (10 mg total) by mouth daily. 30 tablet 6  . midodrine (PROAMATINE) 10 MG tablet Take 1 tablet (10 mg total) by mouth 3 (three) times daily with meals.  90 tablet 6  . OXYGEN Inhale 2 L into the lungs continuous.    . sildenafil (REVATIO) 20 MG tablet Take 2 tablets (40 mg total) by mouth 3 (three) times daily. 180 tablet 6  . spironolactone (ALDACTONE) 25 MG tablet Take 0.5 tablets (12.5 mg total) by mouth daily. 30 tablet 6  . torsemide (DEMADEX) 20 MG tablet Take 3 tablets (60 mg total) by mouth daily.    . traZODone (DESYREL) 300 MG tablet Take 1 tablet (300 mg total) by mouth at bedtime. 90 tablet 1   No current facility-administered medications for this encounter.     Vitals:   11/03/19 1007  BP: (!) 153/62  Pulse: 93  SpO2: 99%  Weight: 122.8 kg (270 lb 12.8 oz)   Wt Readings from Last 3 Encounters:  11/03/19 122.8 kg (270 lb 12.8 oz)  10/25/19 125.6 kg (276 lb 14.4 oz)  06/24/19 136 kg (299 lb 13.2 oz)    PHYSICAL EXAM: General:  Appears chronically ill. Arrived in a wheel chair. No resp difficulty HEENT: normal Neck: supple. JVP 7-8. Carotids 2+ bilaterally; no bruits. No lymphadenopathy or thryomegaly appreciated. Cor: PMI normal. Regular rate & rhythm. No rubs, gallops or murmurs. Lungs: clear Abdomen: obese, soft, nontender, nondistended. No hepatosplenomegaly. No bruits or masses. Good bowel sounds. Extremities: no cyanosis, clubbing, rash, trace edema Neuro: alert & orientedx3, cranial nerves grossly intact. Moves all 4 extremities w/o difficulty. Affect pleasant.   ASSESSMENT & PLAN: 1. PAH with cor pulmonale, end-stage - RHC numbers as above, likely multifactorial WHO Group, I,II,III - marked R-sided volume overload complicated by cardiorenal syndrome - NYHA III. Volume status ok today. Continue current dose of torsemide.  - Continue sildenafil 40 mg tid and macitentan 10 daily.   - Continue midodrine 10 mg tid.   2. Chronic diastolic heart  failurewith R>L HF -Echo 7/20 EF 60-65% RV reportedly normal with RVSP 85 - NYHA III. Volume status stable. Continue torsemide 60 mg daily.  - Check BMET today.   3.  chronic renal failure- stage IV - -Renal u/s with severe bilateral cortical thinning - Likely not candidate for HD due to severe PAH  - Check BMET today.   4 Permanent AF - On eliquis  - Rate controlled.   5. Chronic hypoxic respiratory failure on home O2 - non-smoker. Suspect OSA /OHS/PAH -Not wearing oxygen today. Stressed the importance of home oxygen.   6 Severe obesity (BMI >= 40)  Body mass index is 45.06 kg/m.  7. Type 2 diabetes mellitus with nephropathy -Reports taking trulicity and 2u insulin daily at home - A1c 7.9  8. Anemia of Chronic disease  Follow up in 4-6 weeks with Dr Haroldine Laws.  Referred to [facility] for assistance with food.   Anel Creighton  NP-C  11:08 AM

## 2019-11-04 ENCOUNTER — Other Ambulatory Visit: Payer: Self-pay | Admitting: Internal Medicine

## 2019-11-04 ENCOUNTER — Ambulatory Visit (INDEPENDENT_AMBULATORY_CARE_PROVIDER_SITE_OTHER): Payer: Medicare HMO

## 2019-11-04 ENCOUNTER — Other Ambulatory Visit: Payer: Self-pay | Admitting: Family Medicine

## 2019-11-04 ENCOUNTER — Telehealth: Payer: Self-pay | Admitting: *Deleted

## 2019-11-04 DIAGNOSIS — N179 Acute kidney failure, unspecified: Secondary | ICD-10-CM

## 2019-11-04 DIAGNOSIS — I130 Hypertensive heart and chronic kidney disease with heart failure and stage 1 through stage 4 chronic kidney disease, or unspecified chronic kidney disease: Secondary | ICD-10-CM

## 2019-11-04 DIAGNOSIS — J45909 Unspecified asthma, uncomplicated: Secondary | ICD-10-CM

## 2019-11-04 DIAGNOSIS — I700 Atherosclerosis of aorta: Secondary | ICD-10-CM

## 2019-11-04 DIAGNOSIS — J449 Chronic obstructive pulmonary disease, unspecified: Secondary | ICD-10-CM

## 2019-11-04 DIAGNOSIS — J431 Panlobular emphysema: Secondary | ICD-10-CM

## 2019-11-04 DIAGNOSIS — E1122 Type 2 diabetes mellitus with diabetic chronic kidney disease: Secondary | ICD-10-CM

## 2019-11-04 DIAGNOSIS — M810 Age-related osteoporosis without current pathological fracture: Secondary | ICD-10-CM

## 2019-11-04 DIAGNOSIS — Z981 Arthrodesis status: Secondary | ICD-10-CM

## 2019-11-04 DIAGNOSIS — Z9181 History of falling: Secondary | ICD-10-CM

## 2019-11-04 DIAGNOSIS — Z7901 Long term (current) use of anticoagulants: Secondary | ICD-10-CM

## 2019-11-04 DIAGNOSIS — R29810 Facial weakness: Secondary | ICD-10-CM

## 2019-11-04 DIAGNOSIS — I5033 Acute on chronic diastolic (congestive) heart failure: Secondary | ICD-10-CM | POA: Diagnosis not present

## 2019-11-04 DIAGNOSIS — N184 Chronic kidney disease, stage 4 (severe): Secondary | ICD-10-CM

## 2019-11-04 DIAGNOSIS — D696 Thrombocytopenia, unspecified: Secondary | ICD-10-CM

## 2019-11-04 DIAGNOSIS — D631 Anemia in chronic kidney disease: Secondary | ICD-10-CM

## 2019-11-04 DIAGNOSIS — M1990 Unspecified osteoarthritis, unspecified site: Secondary | ICD-10-CM

## 2019-11-04 DIAGNOSIS — Z6841 Body Mass Index (BMI) 40.0 and over, adult: Secondary | ICD-10-CM

## 2019-11-04 DIAGNOSIS — D500 Iron deficiency anemia secondary to blood loss (chronic): Secondary | ICD-10-CM

## 2019-11-04 DIAGNOSIS — G4733 Obstructive sleep apnea (adult) (pediatric): Secondary | ICD-10-CM

## 2019-11-04 DIAGNOSIS — E1151 Type 2 diabetes mellitus with diabetic peripheral angiopathy without gangrene: Secondary | ICD-10-CM

## 2019-11-04 DIAGNOSIS — E785 Hyperlipidemia, unspecified: Secondary | ICD-10-CM

## 2019-11-04 DIAGNOSIS — Z794 Long term (current) use of insulin: Secondary | ICD-10-CM

## 2019-11-04 DIAGNOSIS — Z9981 Dependence on supplemental oxygen: Secondary | ICD-10-CM

## 2019-11-04 DIAGNOSIS — K219 Gastro-esophageal reflux disease without esophagitis: Secondary | ICD-10-CM

## 2019-11-04 DIAGNOSIS — I4821 Permanent atrial fibrillation: Secondary | ICD-10-CM

## 2019-11-04 DIAGNOSIS — I959 Hypotension, unspecified: Secondary | ICD-10-CM

## 2019-11-04 DIAGNOSIS — I2510 Atherosclerotic heart disease of native coronary artery without angina pectoris: Secondary | ICD-10-CM | POA: Diagnosis not present

## 2019-11-04 DIAGNOSIS — I2721 Secondary pulmonary arterial hypertension: Secondary | ICD-10-CM

## 2019-11-04 DIAGNOSIS — E039 Hypothyroidism, unspecified: Secondary | ICD-10-CM

## 2019-11-04 DIAGNOSIS — Z9049 Acquired absence of other specified parts of digestive tract: Secondary | ICD-10-CM

## 2019-11-04 MED ORDER — ACCU-CHEK AVIVA PLUS W/DEVICE KIT: PACK | 0 refills | Status: DC

## 2019-11-04 MED ORDER — ACCU-CHEK AVIVA PLUS VI STRP: ORAL_STRIP | 3 refills | Status: DC

## 2019-11-04 MED ORDER — ACCU-CHEK FASTCLIX LANCETS MISC: 3 refills | Status: DC

## 2019-11-04 MED ORDER — IPRATROPIUM-ALBUTEROL 0.5-2.5 (3) MG/3ML IN SOLN: 3.0000 mL | Freq: Four times a day (QID) | RESPIRATORY_TRACT | 0 refills | Status: DC | PRN

## 2019-11-04 MED ORDER — FERROUS SULFATE 325 (65 FE) MG PO TABS: ORAL_TABLET | ORAL | 0 refills | Status: DC

## 2019-11-04 MED ORDER — ALBUTEROL SULFATE (2.5 MG/3ML) 0.083% IN NEBU: 2.5000 mg | INHALATION_SOLUTION | Freq: Four times a day (QID) | RESPIRATORY_TRACT | 1 refills | Status: DC | PRN

## 2019-11-04 NOTE — Telephone Encounter (Signed)
Med refills to [place]

## 2019-11-04 NOTE — Telephone Encounter (Signed)
Pt has new pharmacy and would like medication sent to [place]. Please address

## 2019-11-04 NOTE — Telephone Encounter (Signed)
Recently discharged, She misplaced her glucose meter, which was sent into [facility]  pt is needing order for nebulizer machine, albuterol & duoneb, and refill on her iron

## 2019-11-05 DIAGNOSIS — I5033 Acute on chronic diastolic (congestive) heart failure: Secondary | ICD-10-CM | POA: Diagnosis not present

## 2019-11-05 DIAGNOSIS — I130 Hypertensive heart and chronic kidney disease with heart failure and stage 1 through stage 4 chronic kidney disease, or unspecified chronic kidney disease: Secondary | ICD-10-CM | POA: Diagnosis not present

## 2019-11-05 DIAGNOSIS — I2510 Atherosclerotic heart disease of native coronary artery without angina pectoris: Secondary | ICD-10-CM | POA: Diagnosis not present

## 2019-11-05 DIAGNOSIS — E1122 Type 2 diabetes mellitus with diabetic chronic kidney disease: Secondary | ICD-10-CM | POA: Diagnosis not present

## 2019-11-05 DIAGNOSIS — E1151 Type 2 diabetes mellitus with diabetic peripheral angiopathy without gangrene: Secondary | ICD-10-CM | POA: Diagnosis not present

## 2019-11-05 DIAGNOSIS — I2721 Secondary pulmonary arterial hypertension: Secondary | ICD-10-CM | POA: Diagnosis not present

## 2019-11-05 DIAGNOSIS — D631 Anemia in chronic kidney disease: Secondary | ICD-10-CM | POA: Diagnosis not present

## 2019-11-05 DIAGNOSIS — J449 Chronic obstructive pulmonary disease, unspecified: Secondary | ICD-10-CM | POA: Diagnosis not present

## 2019-11-05 DIAGNOSIS — N184 Chronic kidney disease, stage 4 (severe): Secondary | ICD-10-CM | POA: Diagnosis not present

## 2019-11-08 ENCOUNTER — Other Ambulatory Visit: Payer: Self-pay | Admitting: Family Medicine

## 2019-11-08 MED ORDER — TORSEMIDE 20 MG PO TABS: 40.0000 mg | ORAL_TABLET | Freq: Every day | ORAL | 3 refills | Status: DC

## 2019-11-08 MED ORDER — APIXABAN 5 MG PO TABS: 5.0000 mg | ORAL_TABLET | Freq: Two times a day (BID) | ORAL | 6 refills | Status: DC

## 2019-11-08 MED ORDER — SPIRONOLACTONE 25 MG PO TABS: 12.5000 mg | ORAL_TABLET | Freq: Every day | ORAL | 6 refills | Status: DC

## 2019-11-08 MED ORDER — POTASSIUM CHLORIDE CRYS ER 20 MEQ PO TBCR: 20.0000 meq | EXTENDED_RELEASE_TABLET | Freq: Every day | ORAL | 3 refills | Status: DC

## 2019-11-08 MED ORDER — SILDENAFIL CITRATE 20 MG PO TABS: 40.0000 mg | ORAL_TABLET | Freq: Three times a day (TID) | ORAL | 6 refills | Status: AC

## 2019-11-08 MED ORDER — MIDODRINE HCL 10 MG PO TABS: 10.0000 mg | ORAL_TABLET | Freq: Three times a day (TID) | ORAL | 6 refills | Status: DC

## 2019-11-09 ENCOUNTER — Telehealth: Payer: Self-pay | Admitting: Cardiology

## 2019-11-09 ENCOUNTER — Other Ambulatory Visit: Payer: Self-pay | Admitting: Family Medicine

## 2019-11-09 DIAGNOSIS — E1151 Type 2 diabetes mellitus with diabetic peripheral angiopathy without gangrene: Secondary | ICD-10-CM | POA: Diagnosis not present

## 2019-11-09 DIAGNOSIS — I5033 Acute on chronic diastolic (congestive) heart failure: Secondary | ICD-10-CM | POA: Diagnosis not present

## 2019-11-09 DIAGNOSIS — N184 Chronic kidney disease, stage 4 (severe): Secondary | ICD-10-CM | POA: Diagnosis not present

## 2019-11-09 DIAGNOSIS — E1122 Type 2 diabetes mellitus with diabetic chronic kidney disease: Secondary | ICD-10-CM | POA: Diagnosis not present

## 2019-11-09 DIAGNOSIS — D631 Anemia in chronic kidney disease: Secondary | ICD-10-CM | POA: Diagnosis not present

## 2019-11-09 DIAGNOSIS — I2510 Atherosclerotic heart disease of native coronary artery without angina pectoris: Secondary | ICD-10-CM | POA: Diagnosis not present

## 2019-11-09 DIAGNOSIS — I130 Hypertensive heart and chronic kidney disease with heart failure and stage 1 through stage 4 chronic kidney disease, or unspecified chronic kidney disease: Secondary | ICD-10-CM | POA: Diagnosis not present

## 2019-11-09 DIAGNOSIS — J449 Chronic obstructive pulmonary disease, unspecified: Secondary | ICD-10-CM | POA: Diagnosis not present

## 2019-11-09 DIAGNOSIS — I2721 Secondary pulmonary arterial hypertension: Secondary | ICD-10-CM | POA: Diagnosis not present

## 2019-11-09 NOTE — Telephone Encounter (Signed)
Spoke to [employer] RN with [facility].She stated patient has gained 6 lbs over night.No sob.Increased swelling in both lower legs and feet.Advised to double Torsemide dose today only.Virtual appointment scheduled with Almyra Deforest PA 11/10/19 at 11:30 am.She stated to call her grand daughter who lives with patient at [phone] for phone visit.

## 2019-11-09 NOTE — Telephone Encounter (Signed)
Last office visit 06/15/2019.

## 2019-11-09 NOTE — Telephone Encounter (Signed)
°  [place] calling to report.  Phone [phone] [name]   Pt c/o swelling: STAT is pt has developed SOB within 24 hours  1) How much weight have you gained and in what time span? 6 pounds overnight  2) If swelling, where is the swelling located? legs  Are you currently taking a fluid pill? yes 3) Are you currently SOB? no  4) Do you have a log of your daily weights (if so, list)? 276 today, 271, 270  Have you gained 3 pounds in a day or 5 pounds in a week?  Have you traveled recently? no

## 2019-11-10 ENCOUNTER — Encounter: Payer: Self-pay | Admitting: Physician Assistant

## 2019-11-10 ENCOUNTER — Telehealth (INDEPENDENT_AMBULATORY_CARE_PROVIDER_SITE_OTHER): Payer: Medicare HMO | Admitting: Physician Assistant

## 2019-11-10 VITALS — Ht 65.0 in | Wt 272.0 lb

## 2019-11-10 DIAGNOSIS — M7989 Other specified soft tissue disorders: Secondary | ICD-10-CM | POA: Diagnosis not present

## 2019-11-10 DIAGNOSIS — I5084 End stage heart failure: Secondary | ICD-10-CM | POA: Diagnosis not present

## 2019-11-10 DIAGNOSIS — Z9111 Patient's noncompliance with dietary regimen: Secondary | ICD-10-CM

## 2019-11-10 DIAGNOSIS — N183 Chronic kidney disease, stage 3 unspecified: Secondary | ICD-10-CM | POA: Diagnosis not present

## 2019-11-10 DIAGNOSIS — Z7901 Long term (current) use of anticoagulants: Secondary | ICD-10-CM | POA: Diagnosis not present

## 2019-11-10 DIAGNOSIS — I5032 Chronic diastolic (congestive) heart failure: Secondary | ICD-10-CM | POA: Diagnosis not present

## 2019-11-10 DIAGNOSIS — E119 Type 2 diabetes mellitus without complications: Secondary | ICD-10-CM

## 2019-11-10 DIAGNOSIS — I4821 Permanent atrial fibrillation: Secondary | ICD-10-CM

## 2019-11-10 DIAGNOSIS — E1122 Type 2 diabetes mellitus with diabetic chronic kidney disease: Secondary | ICD-10-CM

## 2019-11-10 DIAGNOSIS — Z79899 Other long term (current) drug therapy: Secondary | ICD-10-CM

## 2019-11-10 NOTE — Patient Instructions (Signed)
Medication Instructions:   CONTINUE Torsemide 40 mg daily, if weight increases to 275 lbs or greater you may take an additional 20 mg   *If you need a refill on your cardiac medications before your next appointment, please call your pharmacy*  Lab Work: Your physician recommends that you return for lab work NEXT WEEK:  BMET  If you have labs (blood work) drawn today and your tests are completely normal, you will receive your results only by: Marland Kitchen MyChart Message (if you have MyChart) OR . A paper copy in the mail If you have any lab test that is abnormal or we need to change your treatment, we will call you to review the results.  Testing/Procedures:  NONE ordered at this time of appointment   Follow-Up: At [facility], you and your health needs are our priority.  As part of our continuing mission to provide you with exceptional heart care, we have created designated Provider Care Teams.  These Care Teams include your primary Cardiologist (physician) and Advanced Practice Providers (APPs -  Physician Assistants and Nurse Practitioners) who all work together to provide you with the care you need, when you need it.  Your next appointment:    Follow up with [facility] in 2 weeks   Keep schedule appointment with Dr. Haroldine Laws on 12/14/2019  The format for your next appointment:   In Person  Provider:   Darrick Grinder, NP  Other Instructions

## 2019-11-10 NOTE — Progress Notes (Signed)
Virtual Visit via Telephone Note   This visit type was conducted due to national recommendations for restrictions regarding the COVID-19 Pandemic (e.g. social distancing) in an effort to limit this patient's exposure and mitigate transmission in our community.  Due to her co-morbid illnesses, this patient is at least at moderate risk for complications without adequate follow up.  This format is felt to be most appropriate for this patient at this time.  The patient did not have access to video technology/had technical difficulties with video requiring transitioning to audio format only (telephone).  All issues noted in this document were discussed and addressed.  No physical exam could be performed with this format.  Please refer to the patient's chart for her  consent to telehealth for [facility].   Date:  11/12/2019   ID:  [name], DOB 1946/03/20, MRN [id number]  Patient Location: Home Provider Location: Home  PCP:  Claretta Fraise, MD  Cardiologist:  Minus Breeding, MD  Electrophysiologist:  None   Evaluation Performed:  Follow-Up Visit  Chief Complaint:  followup  History of Present Illness:    [name] is a 73 y.o. female with past medical history of morbid obesity, chronic diastolic heart failure, CKD stage III, DM 2, pulmonary hypertension, permanent atrial fibrillation, chronic hypoxic respiratory failure on home O2.  Patient was previously admitted at [facility] in July 2020 was facial droop and confusion.  Work-up was negative for CVA.  Patient was treated for heart failure during the same admission with IV Lasix.  Creatinine on discharge was 1.6.  Echocardiogram obtained in July 2020 showed EF 60 to 65%, with PA peak pressure in the 80s.  More recently, she was transferred from [facility] to [facility] on 10/12/2019 with increasing shortness of breath and lower extremity edema for several weeks.  She was on torsemide 20 mg twice daily  which was not working for her.  Attempt was made to diurese her at [facility], however she had hypotension requiring no apparent epinephrine and IV fluid.  Upon arrival to [facility], she was treated by our heart failure service.  Right heart cath performed on 10/13/2019 showed cardiac output of 7.7, cardiac index 3.1.  Wedge pressure 22.  PA pressure 92/28 with mean pressure 48.  She was treated for moderate to severe pulmonary arterial hypertension with cor pulmonale.  She had a sign of marked right sided volume overload complicated by cardiorenal syndrome.  During the hospitalization, she was started on milrinone and Lasix drip.  She was found to have end-stage pulmonary arterial hypertension.  During the hospitalization, she was started on sildenafil and macitentan.  Weight upon discharge was 276 pounds.  Further diuresis was limited by worsening renal function prior to discharge.  CVP was still mildly elevated however severe PAH prevented further diuresis.  It was recommended she transition to skilled nursing facility for rehab however she opted to return home due to family situation.  She was discharged on spironolactone 12.5 mg daily and torsemide 60 mg daily.    She was last seen in the heart failure clinic on 12/3, she was doing well at the time.  Weight is down to 271 pounds by home scale.  Lab work shows worsening renal function, she was instructed to hold torsemide for 1 day then restart torsemide at 40 mg daily and add 20 meq daily of potassium.  Based on telephone call yesterday we received from [name], patient has gained  6 pounds overnight.  Patient was contacted today via virtual visit.  She says after she doubled up on the dose of the Lasix yesterday, her weight has significantly decreased.  Her weight is now back down to her dry weight of 272 pounds.  I instructed the patient to take an additional 20 mg of torsemide if her weight averages 275 pounds or greater.  She is  breathing better now and does not have any orthopnea or PND.  I recommended she obtain a repeat basic metabolic panel next week and has earlier follow-up with heart failure service in 2 weeks.  Otherwise she denies any of his chest discomfort.  The patient does not have symptoms concerning for COVID-19 infection (fever, chills, cough, or new shortness of breath).    Past Medical History:  Diagnosis Date  . Anemia   . Arthritis   . Asthma   . Atrial fibrillation ([place])   . CAD (coronary artery disease)    LAD 60% proximal stenosis mid and distal 60% stenosis 2009.  . Cataract    had surgery  . CHF (congestive heart failure) ([place])   . COPD (chronic obstructive pulmonary disease) ([place])   . Diabetes mellitus   . Gait instability    uses cane  . GERD (gastroesophageal reflux disease)   . Hyperlipidemia   . Hypertension   . Obesity   . Oxygen dependent    2 liter per nasal cannula  . Sleep apnea, obstructive 2008  . Stroke [facility])    2 mini   Past Surgical History:  Procedure Laterality Date  . APPENDECTOMY    . CARPAL TUNNEL RELEASE     right hand  . CATARACT EXTRACTION W/ INTRAOCULAR LENS  IMPLANT, BILATERAL    . CERVICAL SPINE SURGERY     fusion  . CESAREAN SECTION    . CHOLECYSTECTOMY    . COLONOSCOPY  11/22/2002   normal - Dr.Rrourk  . COLONOSCOPY  11/26/2005   incomplete with negative BE (Dr. Rowe Pavy, [place])  . EYE SURGERY    . FEMUR IM NAIL Left 05/08/2013   Procedure: INTRAMEDULLARY (IM) NAIL FEMORAL--LONG(BIOMET SYSTEM) and Zimmer Cables;  Surgeon: Augustin Schooling, MD;  Location: [place];  Service: Orthopedics;  Laterality: Left;  . HIP SURGERY Left   . KNEE ARTHROSCOPY     twice, left  . RIGHT HEART CATH N/A 10/13/2019   Procedure: Luiz Blare Placement;  Surgeon: Jolaine Artist, MD;  Location: [place] CV LAB;  Service: Cardiovascular;  Laterality: N/A;  . UMBILICAL HERNIA REPAIR    . UPPER GASTROINTESTINAL ENDOSCOPY  11/22/2002   Normal - Dr. Gala Romney     Current  Meds  Medication Sig  . Accu-Chek FastClix Lancets MISC Check BS 2 times daily Dx E11.9  . ACCU-CHEK GUIDE test strip Please specify directions, refills and quantity  . acetaminophen (TYLENOL) 500 MG tablet Take 500 mg by mouth every 4 (four) hours as needed for mild pain.   Marland Kitchen alendronate (FOSAMAX) 70 MG tablet Take 1 tablet (70 mg total) by mouth once a week. (Needs to be seen before next refill)  . apixaban (ELIQUIS) 5 MG TABS tablet Take 1 tablet (5 mg total) by mouth 2 (two) times daily.  . Dulaglutide (TRULICITY) 7.61 YW/7.3XT SOPN NEW PRESCRIPTION REQUEST: TRULICITY 0.75MG /0.5ML - INJECT 0.5ML SUBCUTANEOUSLY ONCE WEEKLY SATURDAY  . fish oil-omega-3 fatty acids 1000 MG capsule Take 2 g by mouth daily.    Marland Kitchen gabapentin (NEURONTIN) 300 MG capsule NEW PRESCRIPTION REQUEST: GABAPENTIN 300MG  -  TAKE ONE CAPSULE BY MOUTH AT BEDTIME  . glucose blood (ACCU-CHEK AVIVA PLUS) test strip Check BS 2 times daily Dx E11.9  . ipratropium-albuterol (DUONEB) 0.5-2.5 (3) MG/3ML SOLN Inhale 3 mLs into the lungs every 6 (six) hours as needed (FOR SHORTNESS OF BREATH).  Marland Kitchen LEVEMIR FLEXTOUCH 100 UNIT/ML Pen NEW PRESCRIPTION REQUEST: LEVEMIR FLEXTOUCH - INJECT 40 UNITS SUBCUTANEOUSLY TWICE DAILY  . levothyroxine (SYNTHROID) 50 MCG tablet NEW PRESCRIPTION REQUEST: LEVOTHYROXINE 50MCG - TAKE ONE TABLET BY MOUTH DAILY  . macitentan (OPSUMIT) 10 MG tablet Take 1 tablet (10 mg total) by mouth daily.  . midodrine (PROAMATINE) 10 MG tablet Take 1 tablet (10 mg total) by mouth 3 (three) times daily with meals.  . OXYGEN Inhale 2 L into the lungs continuous.  . potassium chloride SA (KLOR-CON) 20 MEQ tablet Take 1 tablet (20 mEq total) by mouth daily.  . sildenafil (REVATIO) 20 MG tablet Take 2 tablets (40 mg total) by mouth 3 (three) times daily.  Marland Kitchen spironolactone (ALDACTONE) 25 MG tablet Take 0.5 tablets (12.5 mg total) by mouth daily.  Marland Kitchen torsemide (DEMADEX) 20 MG tablet Take 2 tablets (40 mg total) by mouth daily.  .  trazodone (DESYREL) 300 MG tablet NEW PRESCRIPTION REQUEST: TRAZODONE 300MG  - TAKE ONE TABLET BY MOUTH AT BEDTIME     Allergies:   Lisinopril and Codeine   Social History   Tobacco Use  . Smoking status: Never Smoker  . Smokeless tobacco: Never Used  Substance Use Topics  . Alcohol use: No  . Drug use: No     Family Hx: The patient's family history includes Alzheimer's disease in her father and mother; Asthma in her daughter and grandchild; Breast cancer in her sister; Cancer in her brother, mother, sister, and sister; Colon cancer in her mother; Diabetes in her daughter, mother, sister, and another family member; Heart attack in her mother, sister, and another family member; Heart disease in her brother, mother, and sister; Heart murmur in her brother; Kidney disease in her mother; Migraines in her sister.  ROS:   Please see the history of present illness.     All other systems reviewed and are negative.   Prior CV studies:   The following studies were reviewed today:  Echo 10/13/2019 IMPRESSIONS    1. Severe RV dilation and severely reduced function consistent with cor pulmonale. Septal flattening consistent with RV pressure overload present. Estimated PASP ~ 66 mmHG, which is moderately elevated.  2. Left ventricular ejection fraction, by visual estimation, is 65 to 70%. The left ventricle has normal function. There is no left ventricular hypertrophy.  3. Left ventricular diastolic function could not be evaluated.  4. Right ventricular pressure overload.  5. Small left ventricular internal cavity size.  6. Global right ventricle has severely reduced systolic function.The right ventricular size is severely enlarged. No increase in right ventricular wall thickness.  7. Left atrial size was normal.  8. Right atrial size was severely dilated.  9. Presence of pericardial fat pad. 10. Mild mitral annular calcification. 11. The mitral valve is degenerative. No evidence of  mitral valve regurgitation. 12. The tricuspid valve is grossly normal. Tricuspid valve regurgitation is mild. 13. The aortic valve is tricuspid. Aortic valve regurgitation is not visualized. Mild aortic valve sclerosis without stenosis. 14. The pulmonic valve was grossly normal. Pulmonic valve regurgitation is mild. 15. Moderately elevated pulmonary artery systolic pressure. 16. The tricuspid regurgitant velocity is 3.58 m/s, and with an assumed right atrial pressure of 15 mmHg,  the estimated right ventricular systolic pressure is moderately elevated at 66.3 mmHg. 17. A venous catheter is visualized in the RA, RV and PA. 18. The inferior vena cava is dilated in size with <50% respiratory variability, suggesting right atrial pressure of 15 mmHg.   [place] 10/13/2019  Findings:  RA = 19 RV = 84/19 PA = 92/28 (48) PCW = 22 Fick cardiac output/index = 7.7/3.1 Thermo CO/CI = 4.6/1.9 PVR = 5.5 WU (thermo) Ao sat = 99% PA sat = 65%, 67%  Assessment:  1. 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 Mildly elevated PCWP 3. Moderate to severely reduced CO by Thermo  Plan/Discussion:  Will start milrinone and IV lasix.   If no response may need HD. But with PAH unlikely to tolerate well. May need to get Palliative involved.     Labs/Other Tests and Data Reviewed:    EKG:  An ECG dated 10/13/2019 was personally reviewed today and demonstrated:  Atrial fibrillation with low voltage.  Recent Labs: 10/13/2019: TSH 4.217 10/21/2019: ALT 14; Magnesium 2.1 11/03/2019: B Natriuretic Peptide 316.5; BUN 42; Creatinine, Ser 2.17; Hemoglobin 9.5; Platelets 134; Potassium 3.2; Sodium 141   Recent Lipid Panel Lab Results  Component Value Date/Time   CHOL 149 06/23/2019 05:56 AM   CHOL 157 09/08/2018 04:43 PM   TRIG 60 06/23/2019 05:56 AM   TRIG 188 (H) 09/24/2015 11:30 AM   HDL 55 06/23/2019 05:56 AM   HDL 53 09/08/2018 04:43 PM   HDL 42 09/24/2015 11:30 AM   CHOLHDL 2.7 06/23/2019  05:56 AM   LDLCALC 82 06/23/2019 05:56 AM   LDLCALC 88 09/08/2018 04:43 PM   LDLCALC 33 09/22/2013 12:07 PM    Wt Readings from Last 3 Encounters:  11/10/19 272 lb (123.4 kg)  11/03/19 270 lb 12.8 oz (122.8 kg)  10/25/19 276 lb 14.4 oz (125.6 kg)     Objective:    Vital Signs:  Ht 5\' 5"  (1.651 m)   Wt 272 lb (123.4 kg)   BMI 45.26 kg/m    VITAL SIGNS:  reviewed  ASSESSMENT & PLAN:    1. Leg swelling: Patient explains to temporary dietary indiscretion, since increasing the dose of torsemide yesterday, her weight has dropped back down to her dry weight.  We will continue current diuretic with instruction to take additional 20 mg torsemide on a as needed basis for weight increase greater than 3 pounds overnight.  Obtain basic metabolic panel to follow-up on her renal function and potassium.  2. Chronic diastolic heart failure: Recent right heart cath suggestive of moderate to severe pulmonary arterial hypertension with cor pulmonale.  She has end-stage heart failure  3. Permanent atrial fibrillation: On Eliquis.  Heart rate controlled  4. CKD stage III: Obtain basic metabolic panel.  5. DM2: Managed by primary care provider.  COVID-19 Education: The signs and symptoms of COVID-19 were discussed with the patient and how to seek care for testing (follow up with PCP or arrange E-visit).  The importance of social distancing was discussed today.  Time:   Today, I have spent 8 minutes with the patient with telehealth technology discussing the above problems.     Medication Adjustments/Labs and Tests Ordered: Current medicines are reviewed at length with the patient today.  Concerns regarding medicines are outlined above.   Tests Ordered: Orders Placed This Encounter  Procedures  . Basic metabolic panel    Medication Changes: No orders of the defined types were placed in this encounter.   Follow  Up:  In Person in 2 week(s)  Signed, Almyra Deforest, PA  11/12/2019 11:39 PM     [facility]

## 2019-11-11 DIAGNOSIS — D631 Anemia in chronic kidney disease: Secondary | ICD-10-CM | POA: Diagnosis not present

## 2019-11-11 DIAGNOSIS — J449 Chronic obstructive pulmonary disease, unspecified: Secondary | ICD-10-CM | POA: Diagnosis not present

## 2019-11-11 DIAGNOSIS — E1151 Type 2 diabetes mellitus with diabetic peripheral angiopathy without gangrene: Secondary | ICD-10-CM | POA: Diagnosis not present

## 2019-11-11 DIAGNOSIS — I130 Hypertensive heart and chronic kidney disease with heart failure and stage 1 through stage 4 chronic kidney disease, or unspecified chronic kidney disease: Secondary | ICD-10-CM | POA: Diagnosis not present

## 2019-11-11 DIAGNOSIS — I5033 Acute on chronic diastolic (congestive) heart failure: Secondary | ICD-10-CM | POA: Diagnosis not present

## 2019-11-11 DIAGNOSIS — I2510 Atherosclerotic heart disease of native coronary artery without angina pectoris: Secondary | ICD-10-CM | POA: Diagnosis not present

## 2019-11-11 DIAGNOSIS — I2721 Secondary pulmonary arterial hypertension: Secondary | ICD-10-CM | POA: Diagnosis not present

## 2019-11-11 DIAGNOSIS — E1122 Type 2 diabetes mellitus with diabetic chronic kidney disease: Secondary | ICD-10-CM | POA: Diagnosis not present

## 2019-11-11 DIAGNOSIS — N184 Chronic kidney disease, stage 4 (severe): Secondary | ICD-10-CM | POA: Diagnosis not present

## 2019-11-14 DIAGNOSIS — I130 Hypertensive heart and chronic kidney disease with heart failure and stage 1 through stage 4 chronic kidney disease, or unspecified chronic kidney disease: Secondary | ICD-10-CM | POA: Diagnosis not present

## 2019-11-14 DIAGNOSIS — D631 Anemia in chronic kidney disease: Secondary | ICD-10-CM | POA: Diagnosis not present

## 2019-11-14 DIAGNOSIS — E1151 Type 2 diabetes mellitus with diabetic peripheral angiopathy without gangrene: Secondary | ICD-10-CM | POA: Diagnosis not present

## 2019-11-14 DIAGNOSIS — J449 Chronic obstructive pulmonary disease, unspecified: Secondary | ICD-10-CM | POA: Diagnosis not present

## 2019-11-14 DIAGNOSIS — N184 Chronic kidney disease, stage 4 (severe): Secondary | ICD-10-CM | POA: Diagnosis not present

## 2019-11-14 DIAGNOSIS — I2510 Atherosclerotic heart disease of native coronary artery without angina pectoris: Secondary | ICD-10-CM | POA: Diagnosis not present

## 2019-11-14 DIAGNOSIS — E1122 Type 2 diabetes mellitus with diabetic chronic kidney disease: Secondary | ICD-10-CM | POA: Diagnosis not present

## 2019-11-14 DIAGNOSIS — I5033 Acute on chronic diastolic (congestive) heart failure: Secondary | ICD-10-CM | POA: Diagnosis not present

## 2019-11-14 DIAGNOSIS — I2721 Secondary pulmonary arterial hypertension: Secondary | ICD-10-CM | POA: Diagnosis not present

## 2019-11-17 DIAGNOSIS — I5033 Acute on chronic diastolic (congestive) heart failure: Secondary | ICD-10-CM | POA: Diagnosis not present

## 2019-11-17 DIAGNOSIS — D631 Anemia in chronic kidney disease: Secondary | ICD-10-CM | POA: Diagnosis not present

## 2019-11-17 DIAGNOSIS — E1122 Type 2 diabetes mellitus with diabetic chronic kidney disease: Secondary | ICD-10-CM | POA: Diagnosis not present

## 2019-11-17 DIAGNOSIS — N184 Chronic kidney disease, stage 4 (severe): Secondary | ICD-10-CM | POA: Diagnosis not present

## 2019-11-17 DIAGNOSIS — I130 Hypertensive heart and chronic kidney disease with heart failure and stage 1 through stage 4 chronic kidney disease, or unspecified chronic kidney disease: Secondary | ICD-10-CM | POA: Diagnosis not present

## 2019-11-17 DIAGNOSIS — I2721 Secondary pulmonary arterial hypertension: Secondary | ICD-10-CM | POA: Diagnosis not present

## 2019-11-17 DIAGNOSIS — J449 Chronic obstructive pulmonary disease, unspecified: Secondary | ICD-10-CM | POA: Diagnosis not present

## 2019-11-17 DIAGNOSIS — E1151 Type 2 diabetes mellitus with diabetic peripheral angiopathy without gangrene: Secondary | ICD-10-CM | POA: Diagnosis not present

## 2019-11-17 DIAGNOSIS — I2510 Atherosclerotic heart disease of native coronary artery without angina pectoris: Secondary | ICD-10-CM | POA: Diagnosis not present

## 2019-11-21 ENCOUNTER — Other Ambulatory Visit: Payer: Self-pay | Admitting: Family Medicine

## 2019-11-24 DIAGNOSIS — J4551 Severe persistent asthma with (acute) exacerbation: Secondary | ICD-10-CM | POA: Diagnosis not present

## 2019-11-24 DIAGNOSIS — I272 Pulmonary hypertension, unspecified: Secondary | ICD-10-CM | POA: Diagnosis not present

## 2019-11-29 DIAGNOSIS — I2510 Atherosclerotic heart disease of native coronary artery without angina pectoris: Secondary | ICD-10-CM | POA: Diagnosis not present

## 2019-11-29 DIAGNOSIS — E1151 Type 2 diabetes mellitus with diabetic peripheral angiopathy without gangrene: Secondary | ICD-10-CM | POA: Diagnosis not present

## 2019-11-29 DIAGNOSIS — I130 Hypertensive heart and chronic kidney disease with heart failure and stage 1 through stage 4 chronic kidney disease, or unspecified chronic kidney disease: Secondary | ICD-10-CM | POA: Diagnosis not present

## 2019-11-29 DIAGNOSIS — I5033 Acute on chronic diastolic (congestive) heart failure: Secondary | ICD-10-CM | POA: Diagnosis not present

## 2019-11-29 DIAGNOSIS — D631 Anemia in chronic kidney disease: Secondary | ICD-10-CM | POA: Diagnosis not present

## 2019-11-29 DIAGNOSIS — E1122 Type 2 diabetes mellitus with diabetic chronic kidney disease: Secondary | ICD-10-CM | POA: Diagnosis not present

## 2019-11-29 DIAGNOSIS — I2721 Secondary pulmonary arterial hypertension: Secondary | ICD-10-CM | POA: Diagnosis not present

## 2019-11-29 DIAGNOSIS — J449 Chronic obstructive pulmonary disease, unspecified: Secondary | ICD-10-CM | POA: Diagnosis not present

## 2019-11-29 DIAGNOSIS — N184 Chronic kidney disease, stage 4 (severe): Secondary | ICD-10-CM | POA: Diagnosis not present

## 2019-11-30 DIAGNOSIS — D692 Other nonthrombocytopenic purpura: Secondary | ICD-10-CM | POA: Diagnosis not present

## 2019-11-30 DIAGNOSIS — I25118 Atherosclerotic heart disease of native coronary artery with other forms of angina pectoris: Secondary | ICD-10-CM | POA: Diagnosis not present

## 2019-11-30 DIAGNOSIS — Z6841 Body Mass Index (BMI) 40.0 and over, adult: Secondary | ICD-10-CM | POA: Diagnosis not present

## 2019-11-30 DIAGNOSIS — D61818 Other pancytopenia: Secondary | ICD-10-CM | POA: Diagnosis not present

## 2019-12-12 ENCOUNTER — Other Ambulatory Visit: Payer: Self-pay | Admitting: Family Medicine

## 2019-12-13 NOTE — Telephone Encounter (Signed)
Stacks. NTBS 30 days given 11/21/19

## 2019-12-13 NOTE — Telephone Encounter (Signed)
Appt made

## 2019-12-14 ENCOUNTER — Encounter (HOSPITAL_COMMUNITY): Payer: Medicare HMO | Admitting: Internal Medicine

## 2019-12-15 ENCOUNTER — Ambulatory Visit (INDEPENDENT_AMBULATORY_CARE_PROVIDER_SITE_OTHER): Payer: Medicare HMO | Admitting: Family Medicine

## 2019-12-15 ENCOUNTER — Other Ambulatory Visit: Payer: Self-pay | Admitting: Family Medicine

## 2019-12-15 DIAGNOSIS — J431 Panlobular emphysema: Secondary | ICD-10-CM

## 2019-12-15 DIAGNOSIS — I482 Chronic atrial fibrillation, unspecified: Secondary | ICD-10-CM

## 2019-12-15 DIAGNOSIS — N184 Chronic kidney disease, stage 4 (severe): Secondary | ICD-10-CM | POA: Diagnosis not present

## 2019-12-15 DIAGNOSIS — I130 Hypertensive heart and chronic kidney disease with heart failure and stage 1 through stage 4 chronic kidney disease, or unspecified chronic kidney disease: Secondary | ICD-10-CM | POA: Diagnosis not present

## 2019-12-15 DIAGNOSIS — I2510 Atherosclerotic heart disease of native coronary artery without angina pectoris: Secondary | ICD-10-CM

## 2019-12-15 DIAGNOSIS — I5033 Acute on chronic diastolic (congestive) heart failure: Secondary | ICD-10-CM | POA: Diagnosis not present

## 2019-12-15 DIAGNOSIS — E1151 Type 2 diabetes mellitus with diabetic peripheral angiopathy without gangrene: Secondary | ICD-10-CM | POA: Diagnosis not present

## 2019-12-15 DIAGNOSIS — E782 Mixed hyperlipidemia: Secondary | ICD-10-CM | POA: Diagnosis not present

## 2019-12-15 DIAGNOSIS — E039 Hypothyroidism, unspecified: Secondary | ICD-10-CM | POA: Diagnosis not present

## 2019-12-15 DIAGNOSIS — M810 Age-related osteoporosis without current pathological fracture: Secondary | ICD-10-CM

## 2019-12-15 DIAGNOSIS — I5022 Chronic systolic (congestive) heart failure: Secondary | ICD-10-CM

## 2019-12-15 DIAGNOSIS — E1121 Type 2 diabetes mellitus with diabetic nephropathy: Secondary | ICD-10-CM | POA: Diagnosis not present

## 2019-12-15 DIAGNOSIS — J449 Chronic obstructive pulmonary disease, unspecified: Secondary | ICD-10-CM | POA: Diagnosis not present

## 2019-12-15 DIAGNOSIS — I2721 Secondary pulmonary arterial hypertension: Secondary | ICD-10-CM | POA: Diagnosis not present

## 2019-12-15 DIAGNOSIS — D631 Anemia in chronic kidney disease: Secondary | ICD-10-CM | POA: Diagnosis not present

## 2019-12-15 DIAGNOSIS — Z7901 Long term (current) use of anticoagulants: Secondary | ICD-10-CM

## 2019-12-15 DIAGNOSIS — E1122 Type 2 diabetes mellitus with diabetic chronic kidney disease: Secondary | ICD-10-CM | POA: Diagnosis not present

## 2019-12-15 MED ORDER — GABAPENTIN 300 MG PO CAPS: 900.0000 mg | ORAL_CAPSULE | Freq: Every day | ORAL | 2 refills | Status: DC

## 2019-12-15 NOTE — Progress Notes (Signed)
Subjective:    Patient ID: [name], female    DOB: 02-Jan-1946, 74 y.o.   MRN: [id number]   HPI: [name] is a 74 y.o. female presenting for "My feet hurt so bad I can hardly walk on them." Mainly on the bottom when she tries to walk.   Hospitalized ffor CHF 3 mos ago.Swelling still, but at baseline of her dependent edema. Pt. Also has ephysema and wears O2 at 3 liters.  presents forFollow-up of diabetes. Patient checks blood sugar at home.   120-130 fasting and 150-160 postprandial Patient denies symptoms such as polyuria, polydipsia, excessive hunger, nausea No significant hypoglycemic spells noted. Medications reviewed. Pt reports taking them regularly without complication/adverse reaction being reported today.      Depression screen [facility] 2/9 06/30/2019 01/03/2019 09/08/2018 07/05/2018 05/28/2018  Decreased Interest 0 0 0 0 0  Down, Depressed, Hopeless 0 0 0 0 0  PHQ - 2 Score 0 0 0 0 0  Altered sleeping - - - - -  Tired, decreased energy - - - - -  Change in appetite - - - - -  Feeling bad or failure about yourself  - - - - -  Trouble concentrating - - - - -  Moving slowly or fidgety/restless - - - - -  Suicidal thoughts - - - - -  PHQ-9 Score - - - - -  Difficult doing work/chores - - - - -  Some recent data might be hidden     Relevant past medical, surgical, family and social history reviewed and updated as indicated.  Interim medical history since our last visit reviewed. Allergies and medications reviewed and updated.  ROS:  Review of Systems  Constitutional: Negative.   HENT: Negative for congestion.   Eyes: Negative for visual disturbance.  Respiratory: Negative for shortness of breath.   Cardiovascular: Negative for chest pain.  Gastrointestinal: Negative for abdominal pain, constipation, diarrhea, nausea and vomiting.  Genitourinary: Negative for difficulty urinating.  Musculoskeletal: Positive for arthralgias (feet). Negative for myalgias.   Neurological: Negative for headaches.  Psychiatric/Behavioral: Negative for sleep disturbance.     Social History   Tobacco Use  Smoking Status Never Smoker  Smokeless Tobacco Never Used       Objective:     Wt Readings from Last 3 Encounters:  11/10/19 272 lb (123.4 kg)  11/03/19 270 lb 12.8 oz (122.8 kg)  10/25/19 276 lb 14.4 oz (125.6 kg)     Exam deferred. Pt. Harboring due to COVID 19. Phone visit performed.   Assessment & Plan:   1. Coronary artery disease involving native coronary artery of native heart without angina pectoris   2. Current use of long term anticoagulation   3. Hypothyroidism, unspecified type   4. Mixed hyperlipidemia   5. Diabetic nephropathy associated with type 2 diabetes mellitus ([place])   6. Osteoporosis, unspecified osteoporosis type, unspecified pathological fracture presence   7. Chronic systolic congestive heart failure ([place])   8. Chronic atrial fibrillation (HCC)   9. Panlobular emphysema ([place])     Meds ordered this encounter  Medications  . DISCONTD: gabapentin (NEURONTIN) 300 MG capsule    Sig: Take 3 capsules (900 mg total) by mouth at bedtime.    Dispense:  90 capsule    Refill:  2  . alendronate (FOSAMAX) 70 MG tablet    Sig: TAKE ONE TABLET BY MOUTH ONCE WEEKLY ON SATURDAY (Needs to be seen for next refill)    Dispense:  4 tablet    Refill:  0  . albuterol (PROVENTIL) (2.5 MG/3ML) 0.083% nebulizer solution    Sig: Take 3 mLs (2.5 mg total) by nebulization every 6 (six) hours as needed for wheezing or shortness of breath.    Dispense:  150 mL    Refill:  1  . albuterol (VENTOLIN HFA) 108 (90 Base) MCG/ACT inhaler    Sig: 2 puffs QID    Dispense:  25.5 g    Refill:  0  . apixaban (ELIQUIS) 5 MG TABS tablet    Sig: Take 1 tablet (5 mg total) by mouth 2 (two) times daily.    Dispense:  60 tablet    Refill:  6  . Dulaglutide (TRULICITY) 2.12 YQ/8.2NO SOPN    Sig: NEW PRESCRIPTION REQUEST: TRULICITY 0.75MG /0.5ML - INJECT  0.5ML SUBCUTANEOUSLY ONCE WEEKLY SATURDAY    Dispense:  6 mL    Refill:  0  . ferrous sulfate 325 (65 FE) MG tablet    Sig: TAKE 1 TABLET BY MOUTH EVERY DAY WITH BREAKFAST    Dispense:  90 tablet    Refill:  0  . gabapentin (NEURONTIN) 300 MG capsule    Sig: Take 1 capsule (300 mg total) by mouth at bedtime.    Dispense:  60 capsule    Refill:  2  . ipratropium-albuterol (DUONEB) 0.5-2.5 (3) MG/3ML SOLN    Sig: Inhale 3 mLs into the lungs every 6 (six) hours as needed (FOR SHORTNESS OF BREATH).    Dispense:  360 mL    Refill:  0  . Insulin Detemir (LEVEMIR FLEXTOUCH) 100 UNIT/ML Pen    Sig: NEW PRESCRIPTION REQUEST: LEVEMIR FLEXTOUCH - INJECT 40 UNITS SUBCUTANEOUSLY TWICE DAILY    Dispense:  72 mL    Refill:  0  . levothyroxine (SYNTHROID) 50 MCG tablet    Sig: LEVOTHYROXINE 50MCG - TAKE ONE TABLET BY MOUTH DAILY    Dispense:  90 tablet    Refill:  0  . spironolactone (ALDACTONE) 25 MG tablet    Sig: Take 0.5 tablets (12.5 mg total) by mouth daily.    Dispense:  30 tablet    Refill:  6  . torsemide (DEMADEX) 20 MG tablet    Sig: Take 2 tablets (40 mg total) by mouth daily.    Dispense:  60 tablet    Refill:  3    Please cancel all previous orders for current medication. Change in dosage or pill size.  . trazodone (DESYREL) 300 MG tablet    Sig: Take 1 tablet (300 mg total) by mouth at bedtime.    Dispense:  30 tablet    Refill:  0    No orders of the defined types were placed in this encounter.     Diagnoses and all orders for this visit:  Coronary artery disease involving native coronary artery of native heart without angina pectoris  Current use of long term anticoagulation -     apixaban (ELIQUIS) 5 MG TABS tablet; Take 1 tablet (5 mg total) by mouth 2 (two) times daily.  Hypothyroidism, unspecified type -     levothyroxine (SYNTHROID) 50 MCG tablet; LEVOTHYROXINE 50MCG - TAKE ONE TABLET BY MOUTH DAILY  Mixed hyperlipidemia  Diabetic nephropathy associated  with type 2 diabetes mellitus (HCC) -     Dulaglutide (TRULICITY) 0.37 CW/8.8QB SOPN; NEW PRESCRIPTION REQUEST: TRULICITY 0.75MG /0.5ML - INJECT 0.5ML SUBCUTANEOUSLY ONCE WEEKLY SATURDAY -     Insulin Detemir (LEVEMIR FLEXTOUCH) 100 UNIT/ML Pen; NEW PRESCRIPTION REQUEST: LEVEMIR  FLEXTOUCH - INJECT 40 UNITS SUBCUTANEOUSLY TWICE DAILY  Osteoporosis, unspecified osteoporosis type, unspecified pathological fracture presence -     alendronate (FOSAMAX) 70 MG tablet; TAKE ONE TABLET BY MOUTH ONCE WEEKLY ON SATURDAY (Needs to be seen for next refill)  Chronic systolic congestive heart failure (HCC) -     spironolactone (ALDACTONE) 25 MG tablet; Take 0.5 tablets (12.5 mg total) by mouth daily. -     torsemide (DEMADEX) 20 MG tablet; Take 2 tablets (40 mg total) by mouth daily.  Chronic atrial fibrillation (HCC) -     apixaban (ELIQUIS) 5 MG TABS tablet; Take 1 tablet (5 mg total) by mouth 2 (two) times daily.  Panlobular emphysema (HCC) -     albuterol (PROVENTIL) (2.5 MG/3ML) 0.083% nebulizer solution; Take 3 mLs (2.5 mg total) by nebulization every 6 (six) hours as needed for wheezing or shortness of breath. -     albuterol (VENTOLIN HFA) 108 (90 Base) MCG/ACT inhaler; 2 puffs QID -     ipratropium-albuterol (DUONEB) 0.5-2.5 (3) MG/3ML SOLN; Inhale 3 mLs into the lungs every 6 (six) hours as needed (FOR SHORTNESS OF BREATH).  Other orders -     Discontinue: gabapentin (NEURONTIN) 300 MG capsule; Take 3 capsules (900 mg total) by mouth at bedtime. -     ferrous sulfate 325 (65 FE) MG tablet; TAKE 1 TABLET BY MOUTH EVERY DAY WITH BREAKFAST -     gabapentin (NEURONTIN) 300 MG capsule; Take 1 capsule (300 mg total) by mouth at bedtime. -     trazodone (DESYREL) 300 MG tablet; Take 1 tablet (300 mg total) by mouth at bedtime.    Virtual Visit via telephone Note  I discussed the limitations, risks, security and privacy concerns of performing an evaluation and management service by telephone and the  availability of in person appointments. The patient was identified with two identifiers. Pt.expressed understanding and agreed to proceed. Pt. Is at home. Dr. Livia Snellen is in his office.  Follow Up Instructions:   I discussed the assessment and treatment plan with the patient. The patient was provided an opportunity to ask questions and all were answered. The patient agreed with the plan and demonstrated an understanding of the instructions.   The patient was advised to call back or seek an in-person evaluation if the symptoms worsen or if the condition fails to improve as anticipated.   Total minutes including chart review and phone contact time: 23   Follow up plan: Return in about 3 months (around 03/14/2020).  Claretta Fraise, MD [place]

## 2019-12-18 ENCOUNTER — Encounter: Payer: Self-pay | Admitting: Family Medicine

## 2019-12-18 MED ORDER — TRULICITY 0.75 MG/0.5ML ~~LOC~~ SOAJ: SUBCUTANEOUS | 0 refills | Status: DC

## 2019-12-18 MED ORDER — LEVOTHYROXINE SODIUM 50 MCG PO TABS: ORAL_TABLET | ORAL | 0 refills | Status: DC

## 2019-12-18 MED ORDER — ALBUTEROL SULFATE (2.5 MG/3ML) 0.083% IN NEBU: 2.5000 mg | INHALATION_SOLUTION | Freq: Four times a day (QID) | RESPIRATORY_TRACT | 1 refills | Status: DC | PRN

## 2019-12-18 MED ORDER — TORSEMIDE 20 MG PO TABS: 40.0000 mg | ORAL_TABLET | Freq: Every day | ORAL | 3 refills | Status: DC

## 2019-12-18 MED ORDER — ALBUTEROL SULFATE HFA 108 (90 BASE) MCG/ACT IN AERS: INHALATION_SPRAY | RESPIRATORY_TRACT | 0 refills | Status: DC

## 2019-12-18 MED ORDER — SPIRONOLACTONE 25 MG PO TABS: 12.5000 mg | ORAL_TABLET | Freq: Every day | ORAL | 6 refills | Status: DC

## 2019-12-18 MED ORDER — APIXABAN 5 MG PO TABS: 5.0000 mg | ORAL_TABLET | Freq: Two times a day (BID) | ORAL | 6 refills | Status: DC

## 2019-12-18 MED ORDER — TRAZODONE HCL 300 MG PO TABS: 300.0000 mg | ORAL_TABLET | Freq: Every day | ORAL | 0 refills | Status: DC

## 2019-12-18 MED ORDER — ALENDRONATE SODIUM 70 MG PO TABS: ORAL_TABLET | ORAL | 0 refills | Status: DC

## 2019-12-18 MED ORDER — LEVEMIR FLEXTOUCH 100 UNIT/ML ~~LOC~~ SOPN: PEN_INJECTOR | SUBCUTANEOUS | 0 refills | Status: DC

## 2019-12-18 MED ORDER — FERROUS SULFATE 325 (65 FE) MG PO TABS: ORAL_TABLET | ORAL | 0 refills | Status: DC

## 2019-12-18 MED ORDER — GABAPENTIN 300 MG PO CAPS: 60.0000 mg | ORAL_CAPSULE | Freq: Every day | ORAL | 2 refills | Status: DC

## 2019-12-18 MED ORDER — IPRATROPIUM-ALBUTEROL 0.5-2.5 (3) MG/3ML IN SOLN: 3.0000 mL | Freq: Four times a day (QID) | RESPIRATORY_TRACT | 0 refills | Status: DC | PRN

## 2019-12-20 ENCOUNTER — Ambulatory Visit: Payer: Medicare HMO | Admitting: Family Medicine

## 2019-12-20 ENCOUNTER — Other Ambulatory Visit: Payer: Self-pay | Admitting: *Deleted

## 2019-12-20 DIAGNOSIS — J431 Panlobular emphysema: Secondary | ICD-10-CM

## 2019-12-20 MED ORDER — ALBUTEROL SULFATE (2.5 MG/3ML) 0.083% IN NEBU: 2.5000 mg | INHALATION_SOLUTION | Freq: Four times a day (QID) | RESPIRATORY_TRACT | 1 refills | Status: DC | PRN

## 2019-12-25 DIAGNOSIS — J4551 Severe persistent asthma with (acute) exacerbation: Secondary | ICD-10-CM | POA: Diagnosis not present

## 2019-12-25 DIAGNOSIS — I272 Pulmonary hypertension, unspecified: Secondary | ICD-10-CM | POA: Diagnosis not present

## 2020-01-01 ENCOUNTER — Other Ambulatory Visit: Payer: Self-pay | Admitting: Family Medicine

## 2020-01-01 DIAGNOSIS — M810 Age-related osteoporosis without current pathological fracture: Secondary | ICD-10-CM

## 2020-01-02 ENCOUNTER — Other Ambulatory Visit: Payer: Self-pay

## 2020-01-03 ENCOUNTER — Ambulatory Visit: Payer: Medicare HMO | Admitting: Family Medicine

## 2020-01-09 ENCOUNTER — Ambulatory Visit: Payer: Medicare HMO | Admitting: Family Medicine

## 2020-01-10 DIAGNOSIS — D3131 Benign neoplasm of right choroid: Secondary | ICD-10-CM | POA: Diagnosis not present

## 2020-01-10 DIAGNOSIS — E119 Type 2 diabetes mellitus without complications: Secondary | ICD-10-CM | POA: Diagnosis not present

## 2020-01-10 DIAGNOSIS — Z0100 Encounter for examination of eyes and vision without abnormal findings: Secondary | ICD-10-CM | POA: Diagnosis not present

## 2020-01-10 DIAGNOSIS — Z961 Presence of intraocular lens: Secondary | ICD-10-CM | POA: Diagnosis not present

## 2020-01-17 ENCOUNTER — Telehealth: Payer: Self-pay | Admitting: Family Medicine

## 2020-01-17 NOTE — Chronic Care Management (AMB) (Signed)
  Chronic Care Management   Note  01/17/2020 Name: ELVA MAURO MRN: [id number] DOB: 27-Mar-1946  [name] is a 74 y.o. year old female who is a primary care patient of Stacks, Cletus Gash, MD. I reached out to [name] by phone today in response to a referral sent by Ms. Maelee L Stacy's health plan.     Ms. Gillingham daughter [name] was given information about Chronic Care Management services today including:  1. CCM service includes personalized support from designated clinical staff supervised by her physician, including individualized plan of care and coordination with other care providers 2. 24/7 contact phone numbers for assistance for urgent and routine care needs. 3. Service will only be billed when office clinical staff spend 20 minutes or more in a month to coordinate care. 4. Only one practitioner may furnish and bill the service in a calendar month. 5. The patient may stop CCM services at any time (effective at the end of the month) by phone call to the office staff. 6. The patient will be responsible for cost sharing (co-pay) of up to 20% of the service fee (after annual deductible is met).  Patient's daughter agreed to services and verbal consent obtained.   Follow up plan: Telephone appointment with care management team member scheduled for:02/15/2020  Noreene Larsson, [place], [place], [place] [address] Direct Dial: [phone] [email or web address]: [email or web address]

## 2020-01-19 ENCOUNTER — Other Ambulatory Visit: Payer: Self-pay

## 2020-01-19 ENCOUNTER — Encounter: Payer: Self-pay | Admitting: Family Medicine

## 2020-01-19 ENCOUNTER — Ambulatory Visit (INDEPENDENT_AMBULATORY_CARE_PROVIDER_SITE_OTHER): Payer: Medicare HMO | Admitting: Family Medicine

## 2020-01-19 NOTE — Progress Notes (Signed)
No answer X 3. Spoke to her daughter who gave me an alternative number. Still no answer X 3 more attempts from 9:45 to 11:00 AM WS

## 2020-01-25 DIAGNOSIS — J4551 Severe persistent asthma with (acute) exacerbation: Secondary | ICD-10-CM | POA: Diagnosis not present

## 2020-01-25 DIAGNOSIS — I272 Pulmonary hypertension, unspecified: Secondary | ICD-10-CM | POA: Diagnosis not present

## 2020-02-09 ENCOUNTER — Other Ambulatory Visit: Payer: Self-pay | Admitting: Family Medicine

## 2020-02-09 DIAGNOSIS — E1121 Type 2 diabetes mellitus with diabetic nephropathy: Secondary | ICD-10-CM

## 2020-02-13 ENCOUNTER — Telehealth: Payer: Self-pay | Admitting: *Deleted

## 2020-02-13 DIAGNOSIS — J431 Panlobular emphysema: Secondary | ICD-10-CM

## 2020-02-13 MED ORDER — IPRATROPIUM-ALBUTEROL 0.5-2.5 (3) MG/3ML IN SOLN: 3.0000 mL | Freq: Four times a day (QID) | RESPIRATORY_TRACT | 1 refills | Status: DC | PRN

## 2020-02-13 MED ORDER — GLOBAL EASE INJECT PEN NEEDLES 32G X 4 MM MISC: 3 refills | Status: DC

## 2020-02-13 NOTE — Telephone Encounter (Signed)
[facility] pharmacy RF request for pen needles & ipratropium-albuterol

## 2020-02-15 ENCOUNTER — Ambulatory Visit: Payer: Medicare HMO | Admitting: Family Medicine

## 2020-02-15 ENCOUNTER — Telehealth: Payer: Medicare HMO

## 2020-02-16 ENCOUNTER — Other Ambulatory Visit: Payer: Self-pay | Admitting: Family Medicine

## 2020-02-16 ENCOUNTER — Ambulatory Visit: Payer: Medicare HMO | Admitting: Family Medicine

## 2020-02-16 DIAGNOSIS — J431 Panlobular emphysema: Secondary | ICD-10-CM

## 2020-02-22 DIAGNOSIS — I272 Pulmonary hypertension, unspecified: Secondary | ICD-10-CM | POA: Diagnosis not present

## 2020-02-22 DIAGNOSIS — J4551 Severe persistent asthma with (acute) exacerbation: Secondary | ICD-10-CM | POA: Diagnosis not present

## 2020-03-01 ENCOUNTER — Telehealth: Payer: Medicare HMO

## 2020-03-08 ENCOUNTER — Ambulatory Visit: Payer: Medicare HMO | Admitting: *Deleted

## 2020-03-08 DIAGNOSIS — E1140 Type 2 diabetes mellitus with diabetic neuropathy, unspecified: Secondary | ICD-10-CM

## 2020-03-08 DIAGNOSIS — E782 Mixed hyperlipidemia: Secondary | ICD-10-CM

## 2020-03-08 DIAGNOSIS — I5022 Chronic systolic (congestive) heart failure: Secondary | ICD-10-CM

## 2020-03-08 NOTE — Chronic Care Management (AMB) (Signed)
  Chronic Care Management   Initial Visit Note  03/08/2020 Name: [name] MRN: [id number] DOB: [birth year]  Referred by: Claretta Fraise, MD Reason for referral : Chronic Care Management   An unsuccessful Initial Telephone Visit was attempted today. The patient was referred to the case management team for assistance with care management and care coordination.   RN Care Plan   . Chronic Disease Management Needs       CARE PLAN ENTRY (see longtitudinal plan of care for additional care plan information)  Current Barriers:  . Chronic Disease Management support, education, and care coordination needs related to CHF, HTN, CAD, CVA, OSA, DM, hypothyroidism, osteoporosis, CKD, and HLD  Clinical Goals: . Over the next 14 days, patient will be contacted by a Care Guide to reschedule their Initial CCM Visit . Over the next 90 days, patient will have an Initial CCM Visit with a member of the embedded CCM team to discuss self-management of their chronic medical conditions  Interventions: . Chart reviewed in preparation for initial visit telephone call . Collaboration with other care team members as needed . Unsuccessful outreach to patient  . A HIPPA compliant phone message was left for the patient providing contact information and requesting a return call.  . Request sent to care guides to reach out and reschedule patient's initial visit  Patient Self Care Activities: . Undetermined   Initial goal documentation            Chong Sicilian, BSN, RN-BC Embedded Chronic Care Manager [facility] Direct Dial: [phone]

## 2020-03-08 NOTE — Patient Instructions (Signed)
Visit Information  Goals Addressed            This Visit's Progress   . Chronic Disease Management Needs       CARE PLAN ENTRY (see longtitudinal plan of care for additional care plan information)  Current Barriers:  . Chronic Disease Management support, education, and care coordination needs related to CHF, HTN, CAD, CVA, OSA, DM, hypothyroidism, osteoporosis, CKD, and HLD  Clinical Goals: . Over the next 14 days, patient will be contacted by a Care Guide to reschedule their Initial CCM Visit . Over the next 90 days, patient will have an Initial CCM Visit with a member of the embedded CCM team to discuss self-management of their chronic medical conditions  Interventions: . Chart reviewed in preparation for initial visit telephone call . Collaboration with other care team members as needed . Unsuccessful outreach to patient  . A HIPPA compliant phone message was left for the patient providing contact information and requesting a return call.  . Request sent to care guides to reach out and reschedule patient's initial visit  Patient Self Care Activities: . Undetermined   Initial goal documentation            Follow-up Plan . A HIPPA compliant phone message was left for the patient providing contact information and requesting a return call.  . Request sent to care guides to reach out and reschedule patient's initial visit   Chong Sicilian, BSN, RN-BC [place] [facility] [place] Management Direct Dial: [phone]

## 2020-03-09 ENCOUNTER — Other Ambulatory Visit: Payer: Self-pay | Admitting: Adult Health

## 2020-03-09 ENCOUNTER — Telehealth: Payer: Self-pay | Admitting: Family Medicine

## 2020-03-09 DIAGNOSIS — I5022 Chronic systolic (congestive) heart failure: Secondary | ICD-10-CM

## 2020-03-09 NOTE — Chronic Care Management (AMB) (Signed)
  Care Management   Note  03/09/2020 Name: JOHNASIA LIESE MRN: [id number] DOB: 12-31-1945  [name] is a 74 y.o. year old female who is a primary care patient of Stacks, Cletus Gash, MD and is actively engaged with the care management team. I reached out to [name] by phone today to assist with re-scheduling an initial visit with the RN Case Manager  Follow up plan: Telephone appointment with care management team member scheduled for:04/25/2020.  [place], [place] [address] Direct Dial: [phone] [email or web address]: [email or web address]

## 2020-03-17 ENCOUNTER — Other Ambulatory Visit: Payer: Self-pay | Admitting: Family Medicine

## 2020-03-17 DIAGNOSIS — J431 Panlobular emphysema: Secondary | ICD-10-CM

## 2020-03-22 ENCOUNTER — Other Ambulatory Visit: Payer: Self-pay

## 2020-03-22 ENCOUNTER — Telehealth: Payer: Self-pay | Admitting: Family Medicine

## 2020-03-22 ENCOUNTER — Ambulatory Visit (INDEPENDENT_AMBULATORY_CARE_PROVIDER_SITE_OTHER): Payer: Medicare HMO | Admitting: Family Medicine

## 2020-03-22 ENCOUNTER — Encounter: Payer: Self-pay | Admitting: Family Medicine

## 2020-03-22 VITALS — BP 172/84 | HR 92 | Temp 97.1°F | Resp 20 | Ht 65.0 in | Wt 274.1 lb

## 2020-03-22 DIAGNOSIS — E119 Type 2 diabetes mellitus without complications: Secondary | ICD-10-CM | POA: Diagnosis not present

## 2020-03-22 DIAGNOSIS — I5022 Chronic systolic (congestive) heart failure: Secondary | ICD-10-CM

## 2020-03-22 DIAGNOSIS — Z794 Long term (current) use of insulin: Secondary | ICD-10-CM | POA: Diagnosis not present

## 2020-03-22 DIAGNOSIS — E039 Hypothyroidism, unspecified: Secondary | ICD-10-CM | POA: Diagnosis not present

## 2020-03-22 DIAGNOSIS — N183 Chronic kidney disease, stage 3 unspecified: Secondary | ICD-10-CM

## 2020-03-22 DIAGNOSIS — E782 Mixed hyperlipidemia: Secondary | ICD-10-CM

## 2020-03-22 DIAGNOSIS — G3184 Mild cognitive impairment, so stated: Secondary | ICD-10-CM

## 2020-03-22 MED ORDER — RIVASTIGMINE 4.6 MG/24HR TD PT24: 4.6000 mg | MEDICATED_PATCH | Freq: Every day | TRANSDERMAL | 2 refills | Status: DC

## 2020-03-22 MED ORDER — GABAPENTIN 300 MG PO CAPS: 602.0000 mg | ORAL_CAPSULE | Freq: Every day | ORAL | 2 refills | Status: DC

## 2020-03-22 NOTE — Progress Notes (Signed)
Subjective:  Patient ID: [name], female    DOB: 1946/09/05  Age: 74 y.o. MRN: [id number]  CC: No chief complaint on file.   HPI [name] presents for seen today for concerns about memory loss.  MMSE performed.  Score noted below.  Patient did not adequately complete the copy design.  The sentence she wrote was incomplete as well.  When accounting for these her score would be 21.  Patient's daughter confirms that she is forgetful.  However the 2 are in conflict.  Daughter states that she is not had a good relationship with her mother and since she was 66 years old.  She says that they have been in conflict ever since her father died.  Patient states that they do have a good relationship and that they are living in her home.  She is concerned that she is left alone with toddler.  She states she cannot care for a 60-year-old.  The daughter states that it is a foster child and will be there much longer.  However mom says that that is been going on for 6 months and they are still there.  There are actually 2 infants one is a 90-year-old 95 is a 86-year-old.  The daughter says that she only leaves younger child.  Then she has to go out and run her errands and things.  They leave the baby in a playpen.  [name] says that she feels insulted when her daughter comes into the room and says it smells like poop.  On the other hand the daughter says that it does smell that way because [name] bedside commode is not empty.  [name] returns that she would love to have it changed more frequently and emptied but she cannot do it for herself because of her ambulation difficulties.  The daughter says that she would empty it more often but she is very busy.  presents forFollow-up of diabetes. Patient not checking  blood sugar at home. Patient denies symptoms such as polyuria, polydipsia, excessive hunger, nausea No significant hypoglycemic spells noted. Medications reviewed. Pt reports  taking them regularly without complication/adverse reaction being reported today.     MMSE - Mini Mental State Exam 03/22/2020 03/08/2018 11/20/2016  Not completed: - Unable to complete -  Orientation to time _0 Orientation to Place _1 Registration _2 Attention/ Calculation 0 0 3  Attention/Calculation-comments - did not attempt due to literacy level -  Recall _3 Language- name 2 objects _4 Language- repeat _5 Language- follow 3 step command _6 Language- read & follow direction _7 Write a sentence 1 0 1  Copy design _8 Total score _9 Depression screen [facility] 2/9 03/22/2020 03/22/2020 06/30/2019  Decreased Interest 2 2 0  Down, Depressed, Hopeless 1 1 0  PHQ - 2 Score 3 3 0  Altered sleeping 3 - -  Tired, decreased energy 3 - -  Change in appetite 1 - -  Feeling bad or failure about yourself  0 - -  Trouble concentrating 1 - -  Moving slowly or fidgety/restless 0 - -  Suicidal thoughts 0 - -  PHQ-9 Score 11 - -  Difficult doing work/chores Somewhat difficult - -  Some recent data might be hidden    History [name] has a past medical history  of Anemia, Arthritis, Asthma, Atrial fibrillation ([place]), CAD (coronary artery disease), Cataract, CHF (congestive heart failure) ([place]), COPD (chronic obstructive pulmonary disease) ([place]), Diabetes mellitus, Gait instability, GERD (gastroesophageal reflux disease), Hyperlipidemia, Hypertension, Obesity, Oxygen dependent, Sleep apnea, obstructive (2008), and Stroke [facility]).   She has a past surgical history that includes Cataract extraction w/ intraocular lens  implant, bilateral; Appendectomy; Cholecystectomy; Carpal tunnel release; Cervical spine surgery; Cesarean section; Umbilical hernia repair; Knee arthroscopy; Colonoscopy (11/22/2002); Upper gastrointestinal endoscopy (11/22/2002); Colonoscopy (11/26/2005); Femur IM nail (Left, 05/08/2013); Hip surgery (Left); Eye surgery; and RIGHT HEART CATH (N/A,  10/13/2019).   Her family history includes Alzheimer's disease in her father and mother; Asthma in her daughter and grandchild; Breast cancer in her sister; Cancer in her brother, mother, sister, and sister; Colon cancer in her mother; Diabetes in her daughter, mother, sister, and another family member; Heart attack in her mother, sister, and another family member; Heart disease in her brother, mother, and sister; Heart murmur in her brother; Kidney disease in her mother; Migraines in her sister.She reports that she has never smoked. She has never used smokeless tobacco. She reports that she does not drink alcohol or use drugs.    ROS Review of Systems  Objective:  BP (!) 172/84   Pulse 92   Temp (!) 97.1 F (36.2 C) (Oral)   Resp 20   Ht _0  (1.651 m)   Wt 274 lb 2 oz (124.3 kg)   SpO2 98%   BMI 45.62 kg/m   BP Readings from Last 3 Encounters:  03/22/20 (!) 172/84  11/03/19 (!) 153/62  10/25/19 102/84    Wt Readings from Last 3 Encounters:  03/22/20 274 lb 2 oz (124.3 kg)  11/10/19 272 lb (123.4 kg)  11/03/19 270 lb 12.8 oz (122.8 kg)     Physical Exam Constitutional:      General: She is not in acute distress.    Appearance: She is well-developed.  HENT:     Head: Normocephalic and atraumatic.  Eyes:     Conjunctiva/sclera: Conjunctivae normal.     Pupils: Pupils are equal, round, and reactive to light.  Neck:     Thyroid: No thyromegaly.  Cardiovascular:     Rate and Rhythm: Normal rate and regular rhythm.     Heart sounds: Normal heart sounds. No murmur.  Pulmonary:     Effort: Pulmonary effort is normal. No respiratory distress.     Breath sounds: Normal breath sounds. No wheezing or rales.  Abdominal:     General: Bowel sounds are normal. There is no distension.     Palpations: Abdomen is soft.     Tenderness: There is no abdominal tenderness.  Musculoskeletal:        General: Normal range of motion.     Cervical back: Normal range of motion and neck  supple.  Lymphadenopathy:     Cervical: No cervical adenopathy.  Skin:    General: Skin is warm and dry.  Neurological:     Mental Status: She is alert and oriented to person, place, and time.  Psychiatric:        Attention and Perception: Attention normal.        Mood and Affect: Mood is anxious.        Speech: Speech normal.        Behavior: Behavior is agitated.        Cognition and Memory: Cognition normal. Memory is impaired.       Assessment & Plan:  Diagnoses and all orders for this visit:  Long-term current use of insulin for diabetes mellitus ([place]) -     CBC with Differential/Platelet -     CMP14+EGFR -     Bayer DCA Hb A1c Waived  Chronic systolic congestive heart failure (HCC) -     CBC with Differential/Platelet -     CMP14+EGFR  Chronic renal impairment, stage 3 (moderate), unspecified whether stage 3a or 3b CKD -     CBC with Differential/Platelet -     CMP14+EGFR  Hypothyroidism, unspecified type -     CBC with Differential/Platelet -     CMP14+EGFR -     TSH  Mixed hyperlipidemia -     CBC with Differential/Platelet -     CMP14+EGFR -     Lipid panel  Mild cognitive impairment  Other orders -     rivastigmine (EXELON) 4.6 mg/24hr; Place 1 patch (4.6 mg total) onto the skin daily. -     gabapentin (NEURONTIN) 300 MG capsule; Take 2 capsules (602 mg total) by mouth at bedtime.       I have changed Venissa L. Fetch's gabapentin. I am also having her start on rivastigmine. Additionally, I am having her maintain her fish oil-omega-3 fatty acids, acetaminophen, fluocinonide-emollient, OXYGEN, macitentan, sildenafil, potassium chloride SA, midodrine, Accu-Chek Aviva Plus, Accu-Chek FastClix Lancets, Accu-Chek Guide, apixaban, Levemir FlexTouch, levothyroxine, spironolactone, trazodone, alendronate, FeroSul, Trulicity, Global Ease Inject Pen Needles, ipratropium-albuterol, albuterol, torsemide, and albuterol.  Allergies as of 03/22/2020       Reactions   Lisinopril    R/t to kidney   Codeine Nausea And Vomiting      Medication List       Accurate as of March 22, 2020 11:59 PM. If you have any questions, ask your nurse or doctor.        Accu-Chek Aviva Plus test strip Generic drug: glucose blood Check BS 2 times daily Dx E11.9   Accu-Chek Guide test strip Generic drug: glucose blood Please specify directions, refills and quantity   Accu-Chek FastClix Lancets Misc Check BS 2 times daily Dx E11.9   acetaminophen 500 MG tablet Commonly known as: TYLENOL Take 500 mg by mouth every 4 (four) hours as needed for mild pain.   albuterol (2.5 MG/3ML) 0.083% nebulizer solution Commonly known as: PROVENTIL Take 3 mLs (2.5 mg total) by nebulization every 6 (six) hours as needed for wheezing or shortness of breath.   albuterol 108 (90 Base) MCG/ACT inhaler Commonly known as: VENTOLIN HFA INHALE 2 PUFFS 4 TIMES A DAY AS DIRECTED   alendronate 70 MG tablet Commonly known as: FOSAMAX TAKE ONE TABLET BY MOUTH ONCE WEEKLY ON SATURDAY   apixaban 5 MG Tabs tablet Commonly known as: ELIQUIS Take 1 tablet (5 mg total) by mouth 2 (two) times daily.   FeroSul 325 (65 FE) MG tablet Generic drug: ferrous sulfate TAKE ONE TABLET BY MOUTH DAILY WITH BREAKFAST   fish oil-omega-3 fatty acids 1000 MG capsule Take 2 g by mouth daily.   fluocinonide-emollient 0.05 % cream Commonly known as: LIDEX-E Apply 1 application topically 2 (two) times daily. To affected areas   gabapentin 300 MG capsule Commonly known as: NEURONTIN Take 2 capsules (602 mg total) by mouth at bedtime. What changed: how much to take Changed by: Claretta Fraise, MD   Global Ease Inject Pen Needles 32G X 4 MM Misc Generic drug: Insulin Pen Needle NEW PRESCRIPTION REQUEST: PEN NEEDLES 4MMX32G - USE AS DIRECTED WITH INSULIN TWICE DAILY  ipratropium-albuterol 0.5-2.5 (3) MG/3ML Soln Commonly known as: DUONEB Inhale 3 mLs into the lungs every 6 (six) hours as  needed (FOR SHORTNESS OF BREATH).   Levemir FlexTouch 100 UNIT/ML FlexPen Generic drug: insulin detemir NEW PRESCRIPTION REQUEST: LEVEMIR FLEXTOUCH - INJECT 40 UNITS SUBCUTANEOUSLY TWICE DAILY   levothyroxine 50 MCG tablet Commonly known as: SYNTHROID LEVOTHYROXINE 50MCG - TAKE ONE TABLET BY MOUTH DAILY   macitentan 10 MG tablet Commonly known as: OPSUMIT Take 1 tablet (10 mg total) by mouth daily.   midodrine 10 MG tablet Commonly known as: PROAMATINE Take 1 tablet (10 mg total) by mouth 3 (three) times daily with meals.   OXYGEN Inhale 2 L into the lungs continuous.   potassium chloride SA 20 MEQ tablet Commonly known as: KLOR-CON Take 1 tablet (20 mEq total) by mouth daily.   rivastigmine 4.6 mg/24hr Commonly known as: Exelon Place 1 patch (4.6 mg total) onto the skin daily. Started by: Claretta Fraise, MD   sildenafil 20 MG tablet Commonly known as: REVATIO Take 2 tablets (40 mg total) by mouth 3 (three) times daily.   spironolactone 25 MG tablet Commonly known as: ALDACTONE Take 0.5 tablets (12.5 mg total) by mouth daily.   torsemide 20 MG tablet Commonly known as: DEMADEX TAKE TWO TABLETS BY MOUTH DAILY   trazodone 300 MG tablet Commonly known as: DESYREL Take 1 tablet (300 mg total) by mouth at bedtime.   Trulicity 7.10 GY/6.9SW Sopn Generic drug: Dulaglutide INJECT 0.5ML SUBCUTANEOUSLY ONCE WEEKLY ON SATURDAY        Follow-up: Return in about 1 week (around 03/29/2020).  Claretta Fraise, M.D.

## 2020-03-22 NOTE — Telephone Encounter (Signed)
Patient scheduled 04/03/2020 at 11:10

## 2020-03-24 DIAGNOSIS — I272 Pulmonary hypertension, unspecified: Secondary | ICD-10-CM | POA: Diagnosis not present

## 2020-03-24 DIAGNOSIS — J4551 Severe persistent asthma with (acute) exacerbation: Secondary | ICD-10-CM | POA: Diagnosis not present

## 2020-03-25 ENCOUNTER — Encounter: Payer: Self-pay | Admitting: Family Medicine

## 2020-04-03 ENCOUNTER — Ambulatory Visit: Payer: Medicare HMO | Admitting: Family Medicine

## 2020-04-09 ENCOUNTER — Other Ambulatory Visit: Payer: Self-pay | Admitting: Family Medicine

## 2020-04-09 ENCOUNTER — Telehealth: Payer: Medicare HMO

## 2020-04-09 DIAGNOSIS — J431 Panlobular emphysema: Secondary | ICD-10-CM

## 2020-04-09 DIAGNOSIS — E1121 Type 2 diabetes mellitus with diabetic nephropathy: Secondary | ICD-10-CM

## 2020-04-11 DIAGNOSIS — R52 Pain, unspecified: Secondary | ICD-10-CM | POA: Diagnosis not present

## 2020-04-11 DIAGNOSIS — R531 Weakness: Secondary | ICD-10-CM | POA: Diagnosis not present

## 2020-04-11 DIAGNOSIS — N189 Chronic kidney disease, unspecified: Secondary | ICD-10-CM | POA: Diagnosis not present

## 2020-04-11 DIAGNOSIS — I4811 Longstanding persistent atrial fibrillation: Secondary | ICD-10-CM | POA: Diagnosis not present

## 2020-04-11 DIAGNOSIS — N179 Acute kidney failure, unspecified: Secondary | ICD-10-CM | POA: Diagnosis not present

## 2020-04-11 DIAGNOSIS — I5033 Acute on chronic diastolic (congestive) heart failure: Secondary | ICD-10-CM | POA: Diagnosis not present

## 2020-04-11 DIAGNOSIS — I4819 Other persistent atrial fibrillation: Secondary | ICD-10-CM | POA: Diagnosis not present

## 2020-04-11 DIAGNOSIS — E119 Type 2 diabetes mellitus without complications: Secondary | ICD-10-CM | POA: Diagnosis not present

## 2020-04-11 DIAGNOSIS — R0689 Other abnormalities of breathing: Secondary | ICD-10-CM | POA: Diagnosis not present

## 2020-04-11 DIAGNOSIS — R2689 Other abnormalities of gait and mobility: Secondary | ICD-10-CM | POA: Diagnosis not present

## 2020-04-11 DIAGNOSIS — R05 Cough: Secondary | ICD-10-CM | POA: Diagnosis not present

## 2020-04-11 DIAGNOSIS — J441 Chronic obstructive pulmonary disease with (acute) exacerbation: Secondary | ICD-10-CM | POA: Diagnosis not present

## 2020-04-11 DIAGNOSIS — J440 Chronic obstructive pulmonary disease with acute lower respiratory infection: Secondary | ICD-10-CM | POA: Diagnosis not present

## 2020-04-11 DIAGNOSIS — J449 Chronic obstructive pulmonary disease, unspecified: Secondary | ICD-10-CM | POA: Diagnosis not present

## 2020-04-11 DIAGNOSIS — Z794 Long term (current) use of insulin: Secondary | ICD-10-CM | POA: Diagnosis not present

## 2020-04-11 DIAGNOSIS — Z7901 Long term (current) use of anticoagulants: Secondary | ICD-10-CM | POA: Diagnosis not present

## 2020-04-11 DIAGNOSIS — R41841 Cognitive communication deficit: Secondary | ICD-10-CM | POA: Diagnosis not present

## 2020-04-11 DIAGNOSIS — R0902 Hypoxemia: Secondary | ICD-10-CM | POA: Diagnosis not present

## 2020-04-11 DIAGNOSIS — M6281 Muscle weakness (generalized): Secondary | ICD-10-CM | POA: Diagnosis not present

## 2020-04-11 DIAGNOSIS — I10 Essential (primary) hypertension: Secondary | ICD-10-CM | POA: Diagnosis not present

## 2020-04-11 DIAGNOSIS — R0602 Shortness of breath: Secondary | ICD-10-CM | POA: Diagnosis not present

## 2020-04-11 DIAGNOSIS — J9621 Acute and chronic respiratory failure with hypoxia: Secondary | ICD-10-CM | POA: Diagnosis not present

## 2020-04-11 DIAGNOSIS — I270 Primary pulmonary hypertension: Secondary | ICD-10-CM | POA: Diagnosis not present

## 2020-04-11 DIAGNOSIS — Z9981 Dependence on supplemental oxygen: Secondary | ICD-10-CM | POA: Diagnosis not present

## 2020-04-11 DIAGNOSIS — I272 Pulmonary hypertension, unspecified: Secondary | ICD-10-CM | POA: Diagnosis not present

## 2020-04-11 DIAGNOSIS — I509 Heart failure, unspecified: Secondary | ICD-10-CM | POA: Diagnosis not present

## 2020-04-11 DIAGNOSIS — I130 Hypertensive heart and chronic kidney disease with heart failure and stage 1 through stage 4 chronic kidney disease, or unspecified chronic kidney disease: Secondary | ICD-10-CM | POA: Diagnosis not present

## 2020-04-11 DIAGNOSIS — I890 Lymphedema, not elsewhere classified: Secondary | ICD-10-CM | POA: Diagnosis not present

## 2020-04-11 DIAGNOSIS — J90 Pleural effusion, not elsewhere classified: Secondary | ICD-10-CM | POA: Diagnosis not present

## 2020-04-11 DIAGNOSIS — I2781 Cor pulmonale (chronic): Secondary | ICD-10-CM | POA: Diagnosis not present

## 2020-04-11 DIAGNOSIS — N170 Acute kidney failure with tubular necrosis: Secondary | ICD-10-CM | POA: Diagnosis not present

## 2020-04-11 DIAGNOSIS — I4891 Unspecified atrial fibrillation: Secondary | ICD-10-CM | POA: Diagnosis not present

## 2020-04-11 DIAGNOSIS — J9611 Chronic respiratory failure with hypoxia: Secondary | ICD-10-CM | POA: Diagnosis not present

## 2020-04-11 DIAGNOSIS — J189 Pneumonia, unspecified organism: Secondary | ICD-10-CM | POA: Diagnosis not present

## 2020-04-11 DIAGNOSIS — Z7401 Bed confinement status: Secondary | ICD-10-CM | POA: Diagnosis not present

## 2020-04-11 DIAGNOSIS — Z6841 Body Mass Index (BMI) 40.0 and over, adult: Secondary | ICD-10-CM | POA: Diagnosis not present

## 2020-04-11 DIAGNOSIS — I110 Hypertensive heart disease with heart failure: Secondary | ICD-10-CM | POA: Diagnosis not present

## 2020-04-11 DIAGNOSIS — R5381 Other malaise: Secondary | ICD-10-CM | POA: Diagnosis not present

## 2020-04-11 DIAGNOSIS — I5032 Chronic diastolic (congestive) heart failure: Secondary | ICD-10-CM | POA: Diagnosis not present

## 2020-04-11 DIAGNOSIS — Z20822 Contact with and (suspected) exposure to covid-19: Secondary | ICD-10-CM | POA: Diagnosis not present

## 2020-04-11 DIAGNOSIS — J4551 Severe persistent asthma with (acute) exacerbation: Secondary | ICD-10-CM | POA: Diagnosis not present

## 2020-04-11 DIAGNOSIS — I5022 Chronic systolic (congestive) heart failure: Secondary | ICD-10-CM | POA: Diagnosis not present

## 2020-04-11 DIAGNOSIS — E785 Hyperlipidemia, unspecified: Secondary | ICD-10-CM | POA: Diagnosis not present

## 2020-04-13 ENCOUNTER — Other Ambulatory Visit: Payer: Self-pay | Admitting: Family Medicine

## 2020-04-13 DIAGNOSIS — J431 Panlobular emphysema: Secondary | ICD-10-CM

## 2020-04-14 DIAGNOSIS — R5381 Other malaise: Secondary | ICD-10-CM | POA: Insufficient documentation

## 2020-04-16 ENCOUNTER — Telehealth: Payer: Self-pay | Admitting: *Deleted

## 2020-04-16 MED ORDER — DILTIAZEM HCL ER BEADS 180 MG PO CP24
180.00 | ORAL_CAPSULE | ORAL | Status: DC
Start: 2020-04-29 — End: 2020-04-16

## 2020-04-16 MED ORDER — LOSARTAN POTASSIUM 50 MG PO TABS
50.00 | ORAL_TABLET | ORAL | Status: DC
Start: 2020-04-17 — End: 2020-04-16

## 2020-04-16 MED ORDER — GABAPENTIN 300 MG PO CAPS
300.00 | ORAL_CAPSULE | ORAL | Status: DC
Start: 2020-04-25 — End: 2020-04-16

## 2020-04-16 MED ORDER — GUAIFENESIN ER 600 MG PO TB12
1200.00 | ORAL_TABLET | ORAL | Status: DC
Start: 2020-04-28 — End: 2020-04-16

## 2020-04-16 MED ORDER — GENERIC EXTERNAL MEDICATION
Status: DC
Start: ? — End: 2020-04-16

## 2020-04-16 MED ORDER — POTASSIUM CHLORIDE CRYS ER 20 MEQ PO TBCR
20.00 | EXTENDED_RELEASE_TABLET | ORAL | Status: DC
Start: 2020-04-17 — End: 2020-04-16

## 2020-04-16 MED ORDER — TORSEMIDE 20 MG PO TABS
40.00 | ORAL_TABLET | ORAL | Status: DC
Start: 2020-04-17 — End: 2020-04-16

## 2020-04-16 MED ORDER — DEXTROSE 50 % IV SOLN
12.50 | INTRAVENOUS | Status: DC
Start: ? — End: 2020-04-16

## 2020-04-16 MED ORDER — LEVOTHYROXINE SODIUM 50 MCG PO TABS
50.00 | ORAL_TABLET | ORAL | Status: DC
Start: 2020-04-29 — End: 2020-04-16

## 2020-04-16 MED ORDER — GLUCOSE 40 % PO GEL
ORAL | Status: DC
Start: ? — End: 2020-04-16

## 2020-04-16 MED ORDER — TRAZODONE HCL 50 MG PO TABS
300.00 | ORAL_TABLET | ORAL | Status: DC
Start: 2020-04-28 — End: 2020-04-16

## 2020-04-16 MED ORDER — IPRATROPIUM-ALBUTEROL 0.5-2.5 (3) MG/3ML IN SOLN
3.00 | RESPIRATORY_TRACT | Status: DC
Start: 2020-04-28 — End: 2020-04-16

## 2020-04-16 MED ORDER — INSULIN LISPRO 100 UNIT/ML ~~LOC~~ SOLN
0.00 | SUBCUTANEOUS | Status: DC
Start: 2020-04-28 — End: 2020-04-16

## 2020-04-16 MED ORDER — CEFTRIAXONE SODIUM 1 G IJ SOLR
1.00 | INTRAMUSCULAR | Status: DC
Start: 2020-04-17 — End: 2020-04-16

## 2020-04-16 MED ORDER — DSS 100 MG PO CAPS
100.00 | ORAL_CAPSULE | ORAL | Status: DC
Start: ? — End: 2020-04-16

## 2020-04-16 MED ORDER — DEXTROMETHORPHAN-GUAIFENESIN 10-100 MG/5ML PO LIQD
10.00 | ORAL | Status: DC
Start: ? — End: 2020-04-16

## 2020-04-16 MED ORDER — INSULIN LISPRO 100 UNIT/ML ~~LOC~~ SOLN
2.00 | SUBCUTANEOUS | Status: DC
Start: 2020-04-28 — End: 2020-04-16

## 2020-04-16 MED ORDER — NYSTATIN 100000 UNIT/GM EX POWD
1.00 | CUTANEOUS | Status: DC
Start: 2020-04-28 — End: 2020-04-16

## 2020-04-16 MED ORDER — ONDANSETRON HCL 4 MG/2ML IJ SOLN
4.00 | INTRAMUSCULAR | Status: DC
Start: ? — End: 2020-04-16

## 2020-04-16 MED ORDER — APIXABAN 5 MG PO TABS
5.00 | ORAL_TABLET | ORAL | Status: DC
Start: 2020-04-28 — End: 2020-04-16

## 2020-04-16 MED ORDER — DEXTROSE 50 % IV SOLN
25.00 | INTRAVENOUS | Status: DC
Start: ? — End: 2020-04-16

## 2020-04-16 MED ORDER — BUMETANIDE 1 MG PO TABS
2.00 | ORAL_TABLET | ORAL | Status: DC
Start: 2020-04-16 — End: 2020-04-16

## 2020-04-16 MED ORDER — GENERIC EXTERNAL MEDICATION
100.00 | Status: DC
Start: 2020-04-16 — End: 2020-04-16

## 2020-04-16 MED ORDER — GLUCAGON (RDNA) 1 MG IJ KIT
1.00 | PACK | INTRAMUSCULAR | Status: DC
Start: ? — End: 2020-04-16

## 2020-04-16 NOTE — Telephone Encounter (Signed)
    Transitional Care Management  Contact Attempt Attempt Date:04/16/2020 Attempted By: Zannie Cove, LPN   1st unsuccessful TCM contact attempt.   I reached out to [name] on her preferred telephone number to discuss Transitional Care Management, medication reconciliation, and to schedule a TCM hospital follow-up with her PCP at [facility].  Discharge Date: 04/14/2020 Location: [facility]  Discharge Dx: CHF/ pneumonia  Recommendations for Outpatient Follow-up:   we do not currently have a discharge summary from [facility] I left a HIPPA compliant message for her to return my call.  Will attempt to contact again within the 2 business day post discharge window if she does not return my call.

## 2020-04-17 DIAGNOSIS — I890 Lymphedema, not elsewhere classified: Secondary | ICD-10-CM | POA: Insufficient documentation

## 2020-04-17 NOTE — Telephone Encounter (Signed)
Spoke with daughter, Malon Kindle, who states that patient has not been discharged from the hospital yet.  She reports that once she is discharged she will go to [facility] for rehab for 4-6 weeks.

## 2020-04-25 ENCOUNTER — Encounter: Payer: Self-pay | Admitting: *Deleted

## 2020-04-25 ENCOUNTER — Ambulatory Visit (INDEPENDENT_AMBULATORY_CARE_PROVIDER_SITE_OTHER): Payer: Medicare HMO | Admitting: *Deleted

## 2020-04-25 DIAGNOSIS — I5022 Chronic systolic (congestive) heart failure: Secondary | ICD-10-CM

## 2020-04-25 DIAGNOSIS — E785 Hyperlipidemia, unspecified: Secondary | ICD-10-CM

## 2020-04-25 DIAGNOSIS — I2781 Cor pulmonale (chronic): Secondary | ICD-10-CM | POA: Insufficient documentation

## 2020-04-25 DIAGNOSIS — I272 Pulmonary hypertension, unspecified: Secondary | ICD-10-CM | POA: Insufficient documentation

## 2020-04-25 MED ORDER — HYDROCODONE-ACETAMINOPHEN 5-325 MG PO TABS
1.00 | ORAL_TABLET | ORAL | Status: DC
Start: ? — End: 2020-04-25

## 2020-04-25 MED ORDER — GENERIC EXTERNAL MEDICATION
Status: DC
Start: ? — End: 2020-04-25

## 2020-04-25 MED ORDER — FUROSEMIDE 10 MG/ML IJ SOLN
40.00 | INTRAMUSCULAR | Status: DC
Start: 2020-04-25 — End: 2020-04-25

## 2020-04-25 NOTE — Chronic Care Management (AMB) (Signed)
  Chronic Care Management   Outreach Note  04/25/2020 Name: GIOVANNINA MUN MRN: [id number] DOB: 11-24-1946  Referred by: Claretta Fraise, MD Reason for referral : Chronic Care Management (Initial Visit)   A 2nd unsuccessful Initial Telephone Visit was attempted today. The patient was referred to the case management team for assistance with care management and care coordination.   Ms Bujak has been inpatient at [facility] since 04/11/20 with plans to transfer to SNF at discharge. Per [facility] notes, patient/family requested [facility] but they have denied acceptance. No alternative SNF have been listed.    Follow Up Plan: A HIPPA compliant phone message was left for the patient providing contact information and requesting a return call.    Chong Sicilian, BSN, RN-BC Embedded Chronic Care Manager [facility] [place] [facility] Direct Dial: [phone]

## 2020-04-27 DIAGNOSIS — I4891 Unspecified atrial fibrillation: Secondary | ICD-10-CM | POA: Diagnosis not present

## 2020-04-27 DIAGNOSIS — R609 Edema, unspecified: Secondary | ICD-10-CM | POA: Diagnosis not present

## 2020-04-27 DIAGNOSIS — T83021A Displacement of indwelling urethral catheter, initial encounter: Secondary | ICD-10-CM | POA: Diagnosis not present

## 2020-04-27 DIAGNOSIS — R0602 Shortness of breath: Secondary | ICD-10-CM | POA: Diagnosis not present

## 2020-04-27 DIAGNOSIS — I270 Primary pulmonary hypertension: Secondary | ICD-10-CM | POA: Diagnosis not present

## 2020-04-27 DIAGNOSIS — J811 Chronic pulmonary edema: Secondary | ICD-10-CM | POA: Diagnosis not present

## 2020-04-27 DIAGNOSIS — J9691 Respiratory failure, unspecified with hypoxia: Secondary | ICD-10-CM | POA: Diagnosis not present

## 2020-04-27 DIAGNOSIS — Z20822 Contact with and (suspected) exposure to covid-19: Secondary | ICD-10-CM | POA: Diagnosis not present

## 2020-04-27 DIAGNOSIS — I509 Heart failure, unspecified: Secondary | ICD-10-CM | POA: Diagnosis not present

## 2020-04-27 DIAGNOSIS — R531 Weakness: Secondary | ICD-10-CM | POA: Diagnosis not present

## 2020-04-27 DIAGNOSIS — R69 Illness, unspecified: Secondary | ICD-10-CM | POA: Diagnosis not present

## 2020-04-27 DIAGNOSIS — N189 Chronic kidney disease, unspecified: Secondary | ICD-10-CM | POA: Diagnosis not present

## 2020-04-27 DIAGNOSIS — E1122 Type 2 diabetes mellitus with diabetic chronic kidney disease: Secondary | ICD-10-CM | POA: Diagnosis not present

## 2020-04-27 DIAGNOSIS — Z03818 Encounter for observation for suspected exposure to other biological agents ruled out: Secondary | ICD-10-CM | POA: Diagnosis not present

## 2020-04-27 DIAGNOSIS — J449 Chronic obstructive pulmonary disease, unspecified: Secondary | ICD-10-CM | POA: Diagnosis not present

## 2020-04-27 DIAGNOSIS — N183 Chronic kidney disease, stage 3 unspecified: Secondary | ICD-10-CM | POA: Diagnosis not present

## 2020-04-27 DIAGNOSIS — I2510 Atherosclerotic heart disease of native coronary artery without angina pectoris: Secondary | ICD-10-CM | POA: Diagnosis not present

## 2020-04-27 DIAGNOSIS — I110 Hypertensive heart disease with heart failure: Secondary | ICD-10-CM | POA: Diagnosis not present

## 2020-04-27 DIAGNOSIS — R2243 Localized swelling, mass and lump, lower limb, bilateral: Secondary | ICD-10-CM | POA: Diagnosis not present

## 2020-04-27 DIAGNOSIS — J90 Pleural effusion, not elsewhere classified: Secondary | ICD-10-CM | POA: Diagnosis not present

## 2020-04-27 DIAGNOSIS — I517 Cardiomegaly: Secondary | ICD-10-CM | POA: Diagnosis not present

## 2020-04-27 DIAGNOSIS — E119 Type 2 diabetes mellitus without complications: Secondary | ICD-10-CM | POA: Diagnosis not present

## 2020-04-27 DIAGNOSIS — R2689 Other abnormalities of gait and mobility: Secondary | ICD-10-CM | POA: Diagnosis not present

## 2020-04-27 DIAGNOSIS — J9621 Acute and chronic respiratory failure with hypoxia: Secondary | ICD-10-CM | POA: Diagnosis not present

## 2020-04-27 DIAGNOSIS — Z79899 Other long term (current) drug therapy: Secondary | ICD-10-CM | POA: Diagnosis not present

## 2020-04-27 DIAGNOSIS — I2781 Cor pulmonale (chronic): Secondary | ICD-10-CM | POA: Diagnosis not present

## 2020-04-27 DIAGNOSIS — Z9981 Dependence on supplemental oxygen: Secondary | ICD-10-CM | POA: Diagnosis not present

## 2020-04-27 DIAGNOSIS — I482 Chronic atrial fibrillation, unspecified: Secondary | ICD-10-CM | POA: Diagnosis not present

## 2020-04-27 DIAGNOSIS — R41841 Cognitive communication deficit: Secondary | ICD-10-CM | POA: Diagnosis not present

## 2020-04-27 DIAGNOSIS — I130 Hypertensive heart and chronic kidney disease with heart failure and stage 1 through stage 4 chronic kidney disease, or unspecified chronic kidney disease: Secondary | ICD-10-CM | POA: Diagnosis not present

## 2020-04-27 DIAGNOSIS — E039 Hypothyroidism, unspecified: Secondary | ICD-10-CM | POA: Diagnosis not present

## 2020-04-27 DIAGNOSIS — J45909 Unspecified asthma, uncomplicated: Secondary | ICD-10-CM | POA: Diagnosis not present

## 2020-04-27 DIAGNOSIS — Z794 Long term (current) use of insulin: Secondary | ICD-10-CM | POA: Diagnosis not present

## 2020-04-27 DIAGNOSIS — Z7401 Bed confinement status: Secondary | ICD-10-CM | POA: Diagnosis not present

## 2020-04-27 DIAGNOSIS — I5033 Acute on chronic diastolic (congestive) heart failure: Secondary | ICD-10-CM | POA: Diagnosis not present

## 2020-04-27 DIAGNOSIS — R0902 Hypoxemia: Secondary | ICD-10-CM | POA: Diagnosis not present

## 2020-04-27 DIAGNOSIS — R5381 Other malaise: Secondary | ICD-10-CM | POA: Diagnosis not present

## 2020-04-27 DIAGNOSIS — M6281 Muscle weakness (generalized): Secondary | ICD-10-CM | POA: Diagnosis not present

## 2020-04-27 DIAGNOSIS — I959 Hypotension, unspecified: Secondary | ICD-10-CM | POA: Diagnosis not present

## 2020-04-28 MED ORDER — GENERIC EXTERNAL MEDICATION
Status: DC
Start: ? — End: 2020-04-28

## 2020-04-28 MED ORDER — NYSTATIN 100000 UNIT/ML MT SUSP
500000.00 | OROMUCOSAL | Status: DC
Start: 2020-04-28 — End: 2020-04-28

## 2020-04-28 MED ORDER — TORSEMIDE 20 MG PO TABS
40.00 | ORAL_TABLET | ORAL | Status: DC
Start: 2020-04-29 — End: 2020-04-28

## 2020-04-30 DIAGNOSIS — I5033 Acute on chronic diastolic (congestive) heart failure: Secondary | ICD-10-CM | POA: Diagnosis not present

## 2020-04-30 DIAGNOSIS — I482 Chronic atrial fibrillation, unspecified: Secondary | ICD-10-CM | POA: Diagnosis not present

## 2020-04-30 DIAGNOSIS — J9691 Respiratory failure, unspecified with hypoxia: Secondary | ICD-10-CM | POA: Diagnosis not present

## 2020-04-30 DIAGNOSIS — N189 Chronic kidney disease, unspecified: Secondary | ICD-10-CM | POA: Diagnosis not present

## 2020-05-08 ENCOUNTER — Other Ambulatory Visit: Payer: Self-pay | Admitting: *Deleted

## 2020-05-08 DIAGNOSIS — E1121 Type 2 diabetes mellitus with diabetic nephropathy: Secondary | ICD-10-CM

## 2020-05-08 DIAGNOSIS — J431 Panlobular emphysema: Secondary | ICD-10-CM

## 2020-05-08 MED ORDER — TRULICITY 0.75 MG/0.5ML ~~LOC~~ SOAJ: SUBCUTANEOUS | 0 refills | Status: DC

## 2020-05-08 MED ORDER — IPRATROPIUM-ALBUTEROL 0.5-2.5 (3) MG/3ML IN SOLN: 3.0000 mL | Freq: Four times a day (QID) | RESPIRATORY_TRACT | 0 refills | Status: DC | PRN

## 2020-05-08 MED ORDER — FERROUS SULFATE 325 (65 FE) MG PO TABS: 325.0000 mg | ORAL_TABLET | Freq: Every day | ORAL | 0 refills | Status: DC

## 2020-05-16 ENCOUNTER — Telehealth: Payer: Self-pay | Admitting: Family Medicine

## 2020-05-16 NOTE — Telephone Encounter (Signed)
Pt will be discharged from SNF 05/18/20-scheduled [facility] appt with Dr Livia Snellen on 05/28/20 at 10:10. Pt just needs TOC call once discharged.

## 2020-05-18 ENCOUNTER — Emergency Department (HOSPITAL_COMMUNITY)
Admission: EM | Admit: 2020-05-18 | Discharge: 2020-05-18 | Disposition: A | Discharge status: Home / Self Care | Payer: Medicare HMO | Attending: Emergency Medicine | Admitting: Emergency Medicine

## 2020-05-18 ENCOUNTER — Encounter (HOSPITAL_COMMUNITY): Payer: Self-pay | Admitting: *Deleted

## 2020-05-18 ENCOUNTER — Other Ambulatory Visit: Payer: Self-pay

## 2020-05-18 ENCOUNTER — Emergency Department (HOSPITAL_COMMUNITY): Payer: Medicare HMO

## 2020-05-18 DIAGNOSIS — Z20822 Contact with and (suspected) exposure to covid-19: Secondary | ICD-10-CM | POA: Insufficient documentation

## 2020-05-18 DIAGNOSIS — I517 Cardiomegaly: Secondary | ICD-10-CM | POA: Diagnosis not present

## 2020-05-18 DIAGNOSIS — I110 Hypertensive heart disease with heart failure: Secondary | ICD-10-CM | POA: Diagnosis not present

## 2020-05-18 DIAGNOSIS — Z03818 Encounter for observation for suspected exposure to other biological agents ruled out: Secondary | ICD-10-CM | POA: Diagnosis not present

## 2020-05-18 DIAGNOSIS — I4891 Unspecified atrial fibrillation: Secondary | ICD-10-CM | POA: Diagnosis not present

## 2020-05-18 DIAGNOSIS — I509 Heart failure, unspecified: Secondary | ICD-10-CM | POA: Diagnosis not present

## 2020-05-18 DIAGNOSIS — R0602 Shortness of breath: Secondary | ICD-10-CM | POA: Diagnosis not present

## 2020-05-18 DIAGNOSIS — J811 Chronic pulmonary edema: Secondary | ICD-10-CM | POA: Diagnosis not present

## 2020-05-18 DIAGNOSIS — Z79899 Other long term (current) drug therapy: Secondary | ICD-10-CM | POA: Insufficient documentation

## 2020-05-18 DIAGNOSIS — E1122 Type 2 diabetes mellitus with diabetic chronic kidney disease: Secondary | ICD-10-CM | POA: Insufficient documentation

## 2020-05-18 DIAGNOSIS — Z466 Encounter for fitting and adjustment of urinary device: Secondary | ICD-10-CM

## 2020-05-18 DIAGNOSIS — N183 Chronic kidney disease, stage 3 unspecified: Secondary | ICD-10-CM | POA: Insufficient documentation

## 2020-05-18 DIAGNOSIS — J90 Pleural effusion, not elsewhere classified: Secondary | ICD-10-CM | POA: Diagnosis not present

## 2020-05-18 DIAGNOSIS — I130 Hypertensive heart and chronic kidney disease with heart failure and stage 1 through stage 4 chronic kidney disease, or unspecified chronic kidney disease: Secondary | ICD-10-CM | POA: Insufficient documentation

## 2020-05-18 DIAGNOSIS — I5033 Acute on chronic diastolic (congestive) heart failure: Secondary | ICD-10-CM | POA: Diagnosis not present

## 2020-05-18 DIAGNOSIS — E039 Hypothyroidism, unspecified: Secondary | ICD-10-CM | POA: Insufficient documentation

## 2020-05-18 DIAGNOSIS — Z794 Long term (current) use of insulin: Secondary | ICD-10-CM | POA: Insufficient documentation

## 2020-05-18 DIAGNOSIS — J45909 Unspecified asthma, uncomplicated: Secondary | ICD-10-CM | POA: Insufficient documentation

## 2020-05-18 DIAGNOSIS — I2510 Atherosclerotic heart disease of native coronary artery without angina pectoris: Secondary | ICD-10-CM | POA: Insufficient documentation

## 2020-05-18 DIAGNOSIS — T83021A Displacement of indwelling urethral catheter, initial encounter: Secondary | ICD-10-CM | POA: Diagnosis not present

## 2020-05-18 DIAGNOSIS — R2243 Localized swelling, mass and lump, lower limb, bilateral: Secondary | ICD-10-CM | POA: Insufficient documentation

## 2020-05-18 DIAGNOSIS — J449 Chronic obstructive pulmonary disease, unspecified: Secondary | ICD-10-CM | POA: Insufficient documentation

## 2020-05-18 LAB — CBC WITH DIFFERENTIAL/PLATELET
Abs Immature Granulocytes: 0.08 10*3/uL — ABNORMAL HIGH (ref 0.00–0.07)
Basophils Absolute: 0 10*3/uL (ref 0.0–0.1)
Basophils Relative: 0 %
Eosinophils Absolute: 0.1 10*3/uL (ref 0.0–0.5)
Eosinophils Relative: 2 %
HCT: 27.9 % — ABNORMAL LOW (ref 36.0–46.0)
Hemoglobin: 8.4 g/dL — ABNORMAL LOW (ref 12.0–15.0)
Immature Granulocytes: 1 %
Lymphocytes Relative: 8 %
Lymphs Abs: 0.7 10*3/uL (ref 0.7–4.0)
MCH: 31 pg (ref 26.0–34.0)
MCHC: 30.1 g/dL (ref 30.0–36.0)
MCV: 103 fL — ABNORMAL HIGH (ref 80.0–100.0)
Monocytes Absolute: 0.8 10*3/uL (ref 0.1–1.0)
Monocytes Relative: 9 %
Neutro Abs: 7.3 10*3/uL (ref 1.7–7.7)
Neutrophils Relative %: 80 %
Platelets: 117 10*3/uL — ABNORMAL LOW (ref 150–400)
RBC: 2.71 MIL/uL — ABNORMAL LOW (ref 3.87–5.11)
RDW: 16.9 % — ABNORMAL HIGH (ref 11.5–15.5)
WBC: 9.1 10*3/uL (ref 4.0–10.5)
nRBC: 0 % (ref 0.0–0.2)

## 2020-05-18 LAB — BASIC METABOLIC PANEL
Anion gap: 9 (ref 5–15)
BUN: 29 mg/dL — ABNORMAL HIGH (ref 8–23)
CO2: 36 mmol/L — ABNORMAL HIGH (ref 22–32)
Calcium: 8.5 mg/dL — ABNORMAL LOW (ref 8.9–10.3)
Chloride: 94 mmol/L — ABNORMAL LOW (ref 98–111)
Creatinine, Ser: 1.22 mg/dL — ABNORMAL HIGH (ref 0.44–1.00)
GFR calc Af Amer: 50 mL/min — ABNORMAL LOW (ref >60)
GFR calc non Af Amer: 43 mL/min — ABNORMAL LOW (ref >60)
Glucose, Bld: 175 mg/dL — ABNORMAL HIGH (ref 70–99)
Potassium: 4.2 mmol/L (ref 3.5–5.1)
Sodium: 139 mmol/L (ref 135–145)

## 2020-05-18 LAB — BRAIN NATRIURETIC PEPTIDE: B Natriuretic Peptide: 425 pg/mL — ABNORMAL HIGH (ref 0.0–100.0)

## 2020-05-18 LAB — SARS CORONAVIRUS 2 BY RT PCR (HOSPITAL ORDER, PERFORMED IN ~~LOC~~ HOSPITAL LAB): SARS Coronavirus 2: NEGATIVE

## 2020-05-18 MED ORDER — TORSEMIDE 20 MG PO TABS
20.0000 mg | ORAL_TABLET | Freq: Every day | ORAL | Status: DC
  Administered 2020-05-18: 20 mg via ORAL
  Filled 2020-05-18 (×5): qty 1

## 2020-05-18 NOTE — ED Triage Notes (Signed)
Pt brought in by [facility] for c/o her foley catheter came out when she came home today; daughter states she wants her evaluated for stroke and chf symtoms; daughter states "this is not my mom"; pt denies any cough, sob or continued weakness; pt states she has some weakness to her left arm but the weakness is intermittent and it has been like that for a "while"; pt was just discharged from [facility] today

## 2020-05-18 NOTE — Discharge Instructions (Signed)
Please increase your torsemide to 20mg  twice daily instead of once daily as it will help with your fluid retention.  Call and follow up closely with your primary care provider for further care.

## 2020-05-18 NOTE — ED Notes (Signed)
Pt daughter called and expressed concern for pt D/C. Pt daughter states pt is not stable to return home. Used therapeutic communication with pt daughter and transferred to Domenic Moras, PA to communicate with pt daughter.

## 2020-05-18 NOTE — ED Notes (Signed)
Notified [name], AC, of Demadex tablet needed.

## 2020-05-18 NOTE — ED Notes (Signed)
Attempted to notify pharmacy of need for Demadex. No response at this time.

## 2020-05-18 NOTE — ED Notes (Signed)
Unable to check pulse ox with ambulation due to pt being non ambulatory.

## 2020-05-18 NOTE — ED Provider Notes (Signed)
[facility] EMERGENCY DEPARTMENT Provider Note   CSN: [id number] Arrival date & time: 05/18/20  1308     History Chief Complaint  Patient presents with  . replace foley catheter    [name] is a 74 y.o. female.  The history is provided by the patient and medical records. No language interpreter was used.     74 year old female with significant history of CHF, chronic kidney disease, diabetes, CAD, prior stroke, atrial fibrillation currently on Eliquis sent here via EMS from home for complication of Foley catheter.  History was obtained from patient.  Patient states she recently released from the hospital to home after being treated for pneumonia.  She had a Foley catheter placed and has had it for the past month due to difficulty getting to the bathroom.  She was told that during the transportation, her Foley catheter came loose and therefore she was brought to the ER for replacements of her Foley.  She does not complain of any significant pain.  She has no other complaints.  Per triage note, patient's daughter states she wants patient to be evaluated for stroke and CHF symptoms.  Patient's daughter is currently not in the room.  Patient did not report any worsening cough shortness of breath or new weaknesses.  Past Medical History:  Diagnosis Date  . Anemia   . Arthritis   . Asthma   . Atrial fibrillation ([place])   . CAD (coronary artery disease)    LAD 60% proximal stenosis mid and distal 60% stenosis 2009.  . Cataract    had surgery  . CHF (congestive heart failure) ([place])   . COPD (chronic obstructive pulmonary disease) ([place])   . Diabetes mellitus   . Gait instability    uses cane  . GERD (gastroesophageal reflux disease)   . Hyperlipidemia   . Hypertension   . Obesity   . Oxygen dependent    2 liter per nasal cannula  . Sleep apnea, obstructive 2008  . Stroke [facility])    2 mini    Patient Active Problem List   Diagnosis Date Noted  . Acute on chronic diastolic  (congestive) heart failure ([place]) 10/12/2019  . TIA (transient ischemic attack)   . Acute renal failure superimposed on stage 3 chronic kidney disease ([place])   . Dyspnea 06/23/2019  . Facial droop 06/22/2019  . Educated about COVID-19 virus infection 04/12/2019  . Leg swelling 04/12/2019  . Coronary artery disease involving native coronary artery of native heart without angina pectoris 04/12/2019  . Hyperlipidemia 02/23/2018  . Current use of long term anticoagulation 08/26/2017  . Hypothyroidism 02/04/2017  . Long-term current use of insulin for diabetes mellitus ([place]) 08/15/2015  . Chronic renal insufficiency 08/15/2015  . Diabetic nephropathy associated with type 2 diabetes mellitus ([place]) 08/15/2015  . Osteoporosis 02/21/2015  . Normocytic anemia due to blood loss 05/09/2013  . Thrombocytopenia, unspecified ([place]) 05/09/2013  . Severe obesity (BMI >= 40) ([place]) 02/28/2008  . PULMONARY HYPERTENSION 02/28/2008  . Congestive heart failure ([place]) 02/28/2008  . PULMONARY NODULE, LEFT UPPER LOBE 02/28/2008  . OBSTRUCTIVE SLEEP APNEA 02/28/2008    Past Surgical History:  Procedure Laterality Date  . APPENDECTOMY    . CARPAL TUNNEL RELEASE     right hand  . CATARACT EXTRACTION W/ INTRAOCULAR LENS  IMPLANT, BILATERAL    . CERVICAL SPINE SURGERY     fusion  . CESAREAN SECTION    . CHOLECYSTECTOMY    . COLONOSCOPY  11/22/2002   normal -  Dr.Rrourk  . COLONOSCOPY  11/26/2005   incomplete with negative BE (Dr. Rowe Pavy, [place])  . EYE SURGERY    . FEMUR IM NAIL Left 05/08/2013   Procedure: INTRAMEDULLARY (IM) NAIL FEMORAL--LONG(BIOMET SYSTEM) and Zimmer Cables;  Surgeon: Augustin Schooling, MD;  Location: [place];  Service: Orthopedics;  Laterality: Left;  . HIP SURGERY Left   . KNEE ARTHROSCOPY     twice, left  . RIGHT HEART CATH N/A 10/13/2019   Procedure: Luiz Blare Placement;  Surgeon: Jolaine Artist, MD;  Location: [place] CV LAB;  Service: Cardiovascular;  Laterality: N/A;  . UMBILICAL  HERNIA REPAIR    . UPPER GASTROINTESTINAL ENDOSCOPY  11/22/2002   Normal - Dr. Gala Romney     OB History   No obstetric history on file.     Family History  Problem Relation Age of Onset  . Colon cancer Mother   . Heart attack Mother   . Alzheimer's disease Mother        father  . Diabetes Mother   . Heart disease Mother   . Cancer Mother        colon  . Kidney disease Mother   . Cancer Sister        brain and spine  . Migraines Sister   . Alzheimer's disease Father   . Cancer Brother        stomach  . Diabetes Sister   . Cancer Sister        breast  . Heart disease Sister   . Heart attack Sister   . Breast cancer Sister   . Heart disease Brother   . Heart murmur Brother   . Diabetes Other   . Heart attack Other   . Diabetes Daughter   . Asthma Daughter   . Asthma Grandchild     Social History   Tobacco Use  . Smoking status: Never Smoker  . Smokeless tobacco: Never Used  Vaping Use  . Vaping Use: Never used  Substance Use Topics  . Alcohol use: No  . Drug use: No    Home Medications Prior to Admission medications   Medication Sig Start Date End Date Taking? Authorizing Provider  Accu-Chek FastClix Lancets MISC Check BS 2 times daily Dx E11.9 11/04/19   Claretta Fraise, MD  ACCU-CHEK GUIDE test strip Please specify directions, refills and quantity 11/07/19   Claretta Fraise, MD  acetaminophen (TYLENOL) 500 MG tablet Take 500 mg by mouth every 4 (four) hours as needed for mild pain.     [provider]  albuterol (PROVENTIL) (2.5 MG/3ML) 0.083% nebulizer solution INHALE THE CONTENTS OF 1 VIAL by NEBULIZER EVERY 6 HOURS AS NEEDED FOR WHEEZING OR SHORTNESS OF BREATH 04/13/20   Claretta Fraise, MD  albuterol (VENTOLIN HFA) 108 (90 Base) MCG/ACT inhaler INHALE 2 PUFFS 4 TIMES A DAY AS DIRECTED 03/19/20   Claretta Fraise, MD  alendronate (FOSAMAX) 70 MG tablet TAKE ONE TABLET BY MOUTH ONCE WEEKLY ON SATURDAY Patient not taking: Reported on 03/22/2020 01/02/20    Claretta Fraise, MD  apixaban (ELIQUIS) 5 MG TABS tablet Take 1 tablet (5 mg total) by mouth 2 (two) times daily. 12/18/19   Claretta Fraise, MD  Dulaglutide (TRULICITY) 4.80 XK/5.5VZ SOPN INJECT 0.5ML SUBCUTANEOUSLY ONCE WEEKLY ON SATURDAY 05/08/20   Claretta Fraise, MD  ferrous sulfate (FEROSUL) 325 (65 FE) MG tablet Take 1 tablet (325 mg total) by mouth daily with breakfast. 05/08/20   Claretta Fraise, MD  fish oil-omega-3 fatty acids 1000 MG capsule  Take 2 g by mouth daily.      [provider]  fluocinonide-emollient (LIDEX-E) 0.05 % cream Apply 1 application topically 2 (two) times daily. To affected areas Patient not taking: Reported on 11/10/2019 03/21/19   Claretta Fraise, MD  gabapentin (NEURONTIN) 300 MG capsule Take 2 capsules (602 mg total) by mouth at bedtime. 03/22/20   Claretta Fraise, MD  glucose blood (ACCU-CHEK AVIVA PLUS) test strip Check BS 2 times daily Dx E11.9 11/04/19   Claretta Fraise, MD  insulin detemir (LEVEMIR FLEXTOUCH) 100 UNIT/ML FlexPen INJECT 40 UNITS SUBCUTANEOUSLY TWICE DAILY 04/09/20   Claretta Fraise, MD  Insulin Pen Needle (GLOBAL EASE INJECT PEN NEEDLES) 32G X 4 MM MISC NEW PRESCRIPTION REQUEST: PEN NEEDLES 4MMX32G - USE AS DIRECTED WITH INSULIN TWICE DAILY 02/13/20   Claretta Fraise, MD  ipratropium-albuterol (DUONEB) 0.5-2.5 (3) MG/3ML SOLN Inhale 3 mLs into the lungs every 6 (six) hours as needed (FOR SHORTNESS OF BREATH). 05/08/20   Claretta Fraise, MD  levothyroxine (SYNTHROID) 50 MCG tablet LEVOTHYROXINE 50MCG - TAKE ONE TABLET BY MOUTH DAILY Patient not taking: Reported on 03/22/2020 12/18/19   Claretta Fraise, MD  macitentan (OPSUMIT) 10 MG tablet Take 1 tablet (10 mg total) by mouth daily. Patient not taking: Reported on 03/22/2020 10/25/19   Darrick Grinder D, NP  midodrine (PROAMATINE) 10 MG tablet Take 1 tablet (10 mg total) by mouth 3 (three) times daily with meals. Patient not taking: Reported on 03/22/2020 11/08/19   Darrick Grinder D, NP  OXYGEN Inhale 2 L into the lungs  continuous.    [provider]  potassium chloride SA (KLOR-CON) 20 MEQ tablet Take 1 tablet (20 mEq total) by mouth daily. 11/08/19   Clegg, Amy D, NP  rivastigmine (EXELON) 4.6 mg/24hr Place 1 patch (4.6 mg total) onto the skin daily. 03/22/20   Claretta Fraise, MD  sildenafil (REVATIO) 20 MG tablet Take 2 tablets (40 mg total) by mouth 3 (three) times daily. Patient not taking: Reported on 03/22/2020 11/08/19   Darrick Grinder D, NP  spironolactone (ALDACTONE) 25 MG tablet Take 0.5 tablets (12.5 mg total) by mouth daily. Patient not taking: Reported on 03/22/2020 12/18/19   Claretta Fraise, MD  torsemide (DEMADEX) 20 MG tablet TAKE TWO TABLETS BY MOUTH DAILY 03/09/20   Bensimhon, Shaune Pascal, MD  trazodone (DESYREL) 300 MG tablet Take 1 tablet (300 mg total) by mouth at bedtime. Patient not taking: Reported on 03/22/2020 12/18/19   Claretta Fraise, MD    Allergies    Lisinopril and Codeine  Review of Systems   Review of Systems  All other systems reviewed and are negative.   Physical Exam Updated Vital Signs BP (!) 149/77 (BP Location: Right Arm)   Pulse 84   Temp 98.6 F (37 C) (Oral)   Resp 16   Ht 5\' 5"  (1.651 m)   Wt 127 kg   SpO2 100%   BMI 46.59 kg/m   Physical Exam Vitals and nursing note reviewed. Exam conducted with a chaperone present.  Constitutional:      General: She is not in acute distress.    Appearance: She is well-developed. She is obese.  HENT:     Head: Atraumatic.  Eyes:     Conjunctiva/sclera: Conjunctivae normal.  Cardiovascular:     Rate and Rhythm: Normal rate and regular rhythm.     Pulses: Normal pulses.     Heart sounds: Normal heart sounds.  Pulmonary:     Effort: Pulmonary effort is normal.  Breath sounds: Normal breath sounds.  Abdominal:     Palpations: Abdomen is soft.     Tenderness: There is no abdominal tenderness.  Genitourinary:    Comments: Foley catheter in place appears to be functional with normal urine noted in Foley  bag Musculoskeletal:        General: Swelling (significant edema noted to BLE) present. Normal range of motion.     Cervical back: Neck supple.  Skin:    Findings: No rash.  Neurological:     Mental Status: She is alert.  Psychiatric:        Mood and Affect: Mood normal.     ED Results / Procedures / Treatments   Labs (all labs ordered are listed, but only abnormal results are displayed) Labs Reviewed  BASIC METABOLIC PANEL - Abnormal; Notable for the following components:      Result Value   Chloride 94 (*)    CO2 36 (*)    Glucose, Bld 175 (*)    BUN 29 (*)    Creatinine, Ser 1.22 (*)    Calcium 8.5 (*)    GFR calc non Af Amer 43 (*)    GFR calc Af Amer 50 (*)    All other components within normal limits  BRAIN NATRIURETIC PEPTIDE - Abnormal; Notable for the following components:   B Natriuretic Peptide 425.0 (*)    All other components within normal limits  CBC WITH DIFFERENTIAL/PLATELET - Abnormal; Notable for the following components:   RBC 2.71 (*)    Hemoglobin 8.4 (*)    HCT 27.9 (*)    MCV 103.0 (*)    RDW 16.9 (*)    Platelets 117 (*)    Abs Immature Granulocytes 0.08 (*)    All other components within normal limits  SARS CORONAVIRUS 2 BY RT PCR [facility] ORDER, [place] LAB)    EKG EKG Interpretation  Date/Time:  Friday May 18 2020 13:24:06 EDT Ventricular Rate:  87 PR Interval:    QRS Duration: 68 QT Interval:  490 QTC Calculation: 590 R Axis:   60 Text Interpretation: Atrial fibrillation Borderline low voltage, extremity leads Nonspecific T abnormalities, lateral leads Prolonged QT interval Baseline wander in lead(s) V2 Since last tracing QT has lengthened Otherwise no significant change Confirmed by Daleen Bo [phone]) on 05/18/2020 5:23:44 PM   Radiology DG Chest Portable 1 View  Result Date: 05/18/2020 CLINICAL DATA:  Assess for pleural effusion, shortness of breath and extremity swelling EXAM: PORTABLE CHEST 1  VIEW COMPARISON:  Radiograph 04/25/2020 FINDINGS: There are diffuse hazy interstitial opacities bilaterally with pulmonary vascular congestion and central vascular cuffing as well as some peripheral septal lines. Bilateral pleural effusions are present including fluid tracking within the right minor fissure and likely layering effusions dependently. Cardiomegaly is similar to slightly increased from prior exam. Calcified aorta. No focal consolidative opacity though underlying airspace disease is difficult to fully exclude. Diffusely edematous changes of the soft tissues. No acute osseous abnormality. Degenerative changes are present in the imaged spine and shoulders. Prior cervical fusion, incompletely assessed on this exam. Telemetry leads overlie the chest. IMPRESSION: 1. Features suggesting CHF with pulmonary edema and bilateral effusions and body wall edema. Underlying airspace disease is difficult to fully exclude. 2. Cardiomegaly similar to slightly increased from prior exam. 3.  Aortic Atherosclerosis (ICD10-I70.0). Electronically Signed   By: Lovena Le M.D.   On: 05/18/2020 15:20    Procedures Procedures (including critical care time)  Medications Ordered in  ED Medications  torsemide (DEMADEX) tablet 20 mg (20 mg Oral Given 05/18/20 1815)    ED Course  I have reviewed the triage vital signs and the nursing notes.  Pertinent labs & imaging results that were available during my care of the patient were reviewed by me and considered in my medical decision making (see chart for details).    MDM Rules/Calculators/A&P                          BP (!) 149/77 (BP Location: Right Arm)   Pulse 84   Temp 98.6 F (37 C) (Oral)   Resp 16   Ht 5\' 5"  (1.651 m)   Wt 127 kg   SpO2 100%   BMI 46.59 kg/m   Final Clinical Impression(s) / ED Diagnoses Final diagnoses:  Encounter for Foley catheter replacement  Acute on chronic congestive heart failure, unspecified heart failure type ([place])     Rx / DC Orders ED Discharge Orders    None     Patient brought to the ED because her connector at her Foley catheter became undone during transportation from a rehab facility to home earlier today.  Patient was hospitalized in the hospital for 2 weeks for pneumonia and was at the rehab facility for 21 days.  At this time she is resting comfortably in no acute discomfort and denies any abdominal pain worsening shortness of breath or cough.  Foley bag was exchanged and patient putting out normal urine.  I spoke with patient's daughter over the phone.  Patient's daughter voiced concern that patient is retaining fluid and has not had adequate physical therapy during her rehab stay.   No obvious stroke symptoms on exam.  Will perform screening chest x-ray as patient does have significant edema to her lower extremities.  Patient does use oxygen at home at 3 L.  6:23 PM I have discussed patient care with patient's daughter over the phone.  Daughter noticed that patient has been significant amount of fluid since hospitalization.  She has not received much rehab at the facility.  She is now unable to ambulate.  She was able to ambulate prior to hospitalization.  Daughter worried that she is having a CHF exacerbation.  Chest x-ray today obtain showing features suggesting CHF with pulmonary edema and bilateral pleural effusion and body wall edema.  Patient's BNP is 425.  Her renal function slightly improved from prior, current creatinine is 1.22.  She is anemic with a hemoglobin of 8.4.  She cannot ambulate due to edema of her lower extremities.  Will consult for admission for CHF exacerbation.  6:44 PM I have consulted with [facility] hospitalist, Dr. Nehemiah Settle and request for hospital admission for CHF exacerbation.  However in the setting of improve renal function with a creatinine of 1.22, patient satting at 100% on 3 L, she does not qualify for admission at this time.  I spoke with family member over the  phone.  I expressed my sincere apologies for not able to admit her to the hospital as she does not qualify for admission.  Will discharge patient with recommendation to increase her diuretic.  She should follow-up with PCP for further management.   Domenic Moras, PA-C 05/18/20 Diamantina Monks, MD 05/21/20 1049

## 2020-05-19 DIAGNOSIS — R5381 Other malaise: Secondary | ICD-10-CM | POA: Diagnosis not present

## 2020-05-19 DIAGNOSIS — W19XXXA Unspecified fall, initial encounter: Secondary | ICD-10-CM | POA: Diagnosis not present

## 2020-05-19 DIAGNOSIS — J9621 Acute and chronic respiratory failure with hypoxia: Secondary | ICD-10-CM | POA: Diagnosis not present

## 2020-05-19 DIAGNOSIS — N390 Urinary tract infection, site not specified: Secondary | ICD-10-CM | POA: Insufficient documentation

## 2020-05-19 DIAGNOSIS — Z6841 Body Mass Index (BMI) 40.0 and over, adult: Secondary | ICD-10-CM | POA: Diagnosis not present

## 2020-05-19 DIAGNOSIS — J9620 Acute and chronic respiratory failure, unspecified whether with hypoxia or hypercapnia: Secondary | ICD-10-CM | POA: Diagnosis not present

## 2020-05-19 DIAGNOSIS — I110 Hypertensive heart disease with heart failure: Secondary | ICD-10-CM | POA: Diagnosis not present

## 2020-05-19 DIAGNOSIS — E039 Hypothyroidism, unspecified: Secondary | ICD-10-CM | POA: Diagnosis not present

## 2020-05-19 DIAGNOSIS — I517 Cardiomegaly: Secondary | ICD-10-CM | POA: Diagnosis not present

## 2020-05-19 DIAGNOSIS — I5033 Acute on chronic diastolic (congestive) heart failure: Secondary | ICD-10-CM | POA: Diagnosis not present

## 2020-05-19 DIAGNOSIS — J9611 Chronic respiratory failure with hypoxia: Secondary | ICD-10-CM | POA: Diagnosis not present

## 2020-05-19 DIAGNOSIS — J90 Pleural effusion, not elsewhere classified: Secondary | ICD-10-CM | POA: Diagnosis not present

## 2020-05-19 DIAGNOSIS — N183 Chronic kidney disease, stage 3 unspecified: Secondary | ICD-10-CM | POA: Diagnosis not present

## 2020-05-19 DIAGNOSIS — E785 Hyperlipidemia, unspecified: Secondary | ICD-10-CM | POA: Diagnosis not present

## 2020-05-19 DIAGNOSIS — R531 Weakness: Secondary | ICD-10-CM | POA: Diagnosis not present

## 2020-05-19 DIAGNOSIS — R69 Illness, unspecified: Secondary | ICD-10-CM | POA: Diagnosis not present

## 2020-05-19 DIAGNOSIS — I130 Hypertensive heart and chronic kidney disease with heart failure and stage 1 through stage 4 chronic kidney disease, or unspecified chronic kidney disease: Secondary | ICD-10-CM | POA: Diagnosis not present

## 2020-05-19 DIAGNOSIS — I270 Primary pulmonary hypertension: Secondary | ICD-10-CM | POA: Diagnosis not present

## 2020-05-19 DIAGNOSIS — Z7401 Bed confinement status: Secondary | ICD-10-CM | POA: Diagnosis not present

## 2020-05-19 DIAGNOSIS — R0902 Hypoxemia: Secondary | ICD-10-CM | POA: Diagnosis not present

## 2020-05-19 DIAGNOSIS — J4551 Severe persistent asthma with (acute) exacerbation: Secondary | ICD-10-CM | POA: Diagnosis not present

## 2020-05-19 DIAGNOSIS — T83511A Infection and inflammatory reaction due to indwelling urethral catheter, initial encounter: Secondary | ICD-10-CM | POA: Diagnosis not present

## 2020-05-19 DIAGNOSIS — D638 Anemia in other chronic diseases classified elsewhere: Secondary | ICD-10-CM | POA: Diagnosis not present

## 2020-05-19 DIAGNOSIS — N189 Chronic kidney disease, unspecified: Secondary | ICD-10-CM | POA: Diagnosis not present

## 2020-05-19 DIAGNOSIS — I279 Pulmonary heart disease, unspecified: Secondary | ICD-10-CM | POA: Diagnosis not present

## 2020-05-19 DIAGNOSIS — M19011 Primary osteoarthritis, right shoulder: Secondary | ICD-10-CM | POA: Diagnosis not present

## 2020-05-19 DIAGNOSIS — I272 Pulmonary hypertension, unspecified: Secondary | ICD-10-CM | POA: Diagnosis not present

## 2020-05-19 DIAGNOSIS — J811 Chronic pulmonary edema: Secondary | ICD-10-CM | POA: Diagnosis not present

## 2020-05-19 DIAGNOSIS — M6281 Muscle weakness (generalized): Secondary | ICD-10-CM | POA: Diagnosis not present

## 2020-05-21 NOTE — Telephone Encounter (Signed)
    Transitional Care Management  Contact Attempt Attempt Date:05/21/2020 Attempted By: Eston Mould, LPN  1st unsuccessful TCM contact attempt.   I reached out to [name] on her preferred telephone number to discuss Transitional Care Management, medication reconciliation, and to schedule a TCM hospital follow-up with her PCP at [facility]  Discharge Date: 05/18/20 Location: [facility] Discharge Dx: CHF  Recommendations for Outpatient Follow-up:   (insert from discharge summary)   Plan I was unable to leave a HIPPA compliant message for her to return my call.  Will attempt to contact again within the 2 business day post discharge window if she does not return my call.   After reviewing her chart she has been admitted to the hospital again currently so will close this encounter out.

## 2020-05-24 ENCOUNTER — Telehealth: Payer: Self-pay | Admitting: *Deleted

## 2020-05-24 NOTE — Telephone Encounter (Signed)
Pt is being discharged from hospital later today so [facility] called to schedule follow up-appt scheduled for 05/28/20 at 10:10 for TOC.

## 2020-05-28 ENCOUNTER — Ambulatory Visit (INDEPENDENT_AMBULATORY_CARE_PROVIDER_SITE_OTHER): Payer: Medicare HMO | Admitting: *Deleted

## 2020-05-28 ENCOUNTER — Encounter: Payer: Self-pay | Admitting: Family Medicine

## 2020-05-28 ENCOUNTER — Ambulatory Visit (INDEPENDENT_AMBULATORY_CARE_PROVIDER_SITE_OTHER): Payer: Medicare HMO | Admitting: Family Medicine

## 2020-05-28 DIAGNOSIS — R5381 Other malaise: Secondary | ICD-10-CM | POA: Diagnosis not present

## 2020-05-28 DIAGNOSIS — I5022 Chronic systolic (congestive) heart failure: Secondary | ICD-10-CM

## 2020-05-28 DIAGNOSIS — R110 Nausea: Secondary | ICD-10-CM

## 2020-05-28 DIAGNOSIS — J189 Pneumonia, unspecified organism: Secondary | ICD-10-CM

## 2020-05-28 MED ORDER — ONDANSETRON 8 MG PO TBDP
8.0000 mg | ORAL_TABLET | Freq: Four times a day (QID) | ORAL | 1 refills | Status: AC | PRN
Start: 2020-05-28 — End: ?

## 2020-05-28 NOTE — Patient Instructions (Signed)
Visit Information  Goals Addressed              This Visit's Progress     Patient Stated   .  "We need to schedule Home Health visits" (pt-stated)        CARE PLAN ENTRY (see longitudinal plan of care for additional care plan information)  Current Barriers:  . Care Coordination needs related to [place] scheduling in a patient with CHF, DM, CKD (disease states) . Transportation barriers  Nurse Case Manager Clinical Goal(s):  Marland Kitchen Over the next 24 hours, patient/family will talk with [facility] regarding scheduling of home health PT  Interventions:  . Inter-disciplinary care team collaboration (see longitudinal plan of care) . Chart reviewed including recent office notes and hospital discharge summary . Spoke with daughter by telephone. Verified that [place] has not outreached to them.  Jeralene Huff out to [facility] at [phone] o Verified that they did receive referral o Verified that they plan to reach out to patient today for scheduling . RN will f/u with patient/family on 06/01/20 . Encouraged to reach out to [facility] sooner if needed  Patient Self Care Activities:  . Self administers medications as prescribed . Attends all scheduled provider appointments . Unable to independently drive to appointments . Unable to perform IADLs independently  Initial goal documentation        Patient verbalizes understanding of instructions provided today.   Follow-up Plan Telephone follow up appointment with care management team member scheduled for:06/01/20  Chong Sicilian, BSN, RN-BC [place] / [place] Management Direct Dial: [phone]

## 2020-05-28 NOTE — Chronic Care Management (AMB) (Signed)
°  Chronic Care Management   Care Coordination Note  05/28/2020 Name: [name] MRN: [id number] DOB: 07/12/1946  [name] was discharged from [facility] on 05/25/20. She had a hosp f/u appt with PCP this morning. Hospital bed and [facility] were ordered at discharge. Per patient's daughter, they have received the hospital bed but home health services have not been scheduled.  RN Care Plan     "We need to schedule Home Health visits" (pt-stated)        CARE PLAN ENTRY (see longitudinal plan of care for additional care plan information)  Current Barriers:   Care Coordination needs related to [place] scheduling in a patient with CHF, DM, CKD (disease states)  Transportation barriers  Nurse Case Manager Clinical Goal(s):   Over the next 24 hours, patient/family will talk with [facility] regarding scheduling of home health PT  Interventions:   Inter-disciplinary care team collaboration (see longitudinal plan of care)  Chart reviewed including recent office notes and hospital discharge summary  Spoke with daughter by telephone. Verified that [place] has not outreached to them.   Reached out to [facility] at [phone] o Verified that they did receive referral o Verified that they plan to reach out to patient today for scheduling  RN will f/u with patient/family on 06/01/20  Encouraged to reach out to [facility] sooner if needed  Patient Self Care Activities:   Self administers medications as prescribed  Attends all scheduled provider appointments  Unable to independently drive to appointments  Unable to perform IADLs independently  Initial goal documentation        Follow up plan: Telephone follow up appointment with care management team member scheduled for:06/01/20 RNCM  [name], BSN, RN-BC [place] / [place] Management Direct Dial: [phone]

## 2020-05-28 NOTE — Progress Notes (Signed)
Subjective:    Patient ID: [name], female    DOB: June 25, 1946, 74 y.o.   MRN: [id number]   HPI: [name] is a 74 y.o. female presenting for transfer transition of care.  She was recently hospitalized at [facility].  She was diagnosed with pneumonia.  She was discharged from the rehab on Friday, June 25.  She has not been able to receive services from home health yet.  She is having urinary incontinence.  She wets herself without knowing it.  She is nauseous and at times when she eats she is so nauseous and at other times she vomits. She has a history of congestive heart failure for which he was recently evaluated. She does tend to have swelling in the legs. However at this time she denies shortness of breath. She also denies cough. She is not having any fever since being home. Patient is so weak she cannot stand up on her own currently. He would like to have home health physical therapy.   Depression screen [facility] 2/9 03/22/2020 03/22/2020 06/30/2019 01/03/2019 09/08/2018  Decreased Interest 2 2 0 0 0  Down, Depressed, Hopeless 1 1 0 0 0  PHQ - 2 Score 3 3 0 0 0  Altered sleeping 3 - - - -  Tired, decreased energy 3 - - - -  Change in appetite 1 - - - -  Feeling bad or failure about yourself  0 - - - -  Trouble concentrating 1 - - - -  Moving slowly or fidgety/restless 0 - - - -  Suicidal thoughts 0 - - - -  PHQ-9 Score 11 - - - -  Difficult doing work/chores Somewhat difficult - - - -  Some recent data might be hidden     Relevant past medical, surgical, family and social history reviewed and updated as indicated.  Interim medical history since our last visit reviewed. Allergies and medications reviewed and updated.  ROS:  Review of Systems  Constitutional: Positive for activity change.  HENT: Negative for congestion.   Respiratory: Negative for shortness of breath.   Cardiovascular: Negative for chest pain.  Gastrointestinal: Positive for nausea and vomiting.  Negative for abdominal pain and diarrhea.  Genitourinary: Positive for difficulty urinating and enuresis (And incontinence). Negative for frequency.  Musculoskeletal: Negative for arthralgias and myalgias.  Neurological: Negative for headaches.  Psychiatric/Behavioral: Negative for sleep disturbance.     Social History   Tobacco Use  Smoking Status Never Smoker  Smokeless Tobacco Never Used       Objective:     Wt Readings from Last 3 Encounters:  05/18/20 280 lb (127 kg)  03/22/20 274 lb 2 oz (124.3 kg)  11/10/19 272 lb (123.4 kg)     Exam deferred. Pt. [place] due to COVID 19. Phone visit performed.   Assessment & Plan:   1. Chronic systolic congestive heart failure ([place])   2. Pneumonia due to infectious organism, unspecified laterality, unspecified part of lung   3. Nausea   4. Physical debility     Meds ordered this encounter  Medications  . ondansetron (ZOFRAN-ODT) 8 MG disintegrating tablet    Sig: Take 1 tablet (8 mg total) by mouth every 6 (six) hours as needed for nausea or vomiting.    Dispense:  20 tablet    Refill:  1    Orders Placed This Encounter  Procedures  . Referral to Chronic Care Management Services    Referral Priority:  Routine    Referral Type:   Consultation    Referral Reason:   Care Coordination    Number of Visits Requested:   1  . Ambulatory referral to Home Health    Referral Priority:   Routine    Referral Type:   Home Health Care    Referral Reason:   Specialty Services Required    Requested Specialty:   [place]    Number of Visits Requested:   1      Diagnoses and all orders for this visit:  Chronic systolic congestive heart failure ([place]) -     Referral to Chronic Care Management Services -     Ambulatory referral to Home Health  Pneumonia due to infectious organism, unspecified laterality, unspecified part of lung -     Referral to Chronic Care Management Services -     Ambulatory referral to Home  Health  Nausea -     Referral to Chronic Care Management Services -     Ambulatory referral to Home Health  Physical debility -     Referral to Chronic Care Management Services -     Ambulatory referral to [place]  Other orders -     ondansetron (ZOFRAN-ODT) 8 MG disintegrating tablet; Take 1 tablet (8 mg total) by mouth every 6 (six) hours as needed for nausea or vomiting.    Virtual Visit via telephone Note  I discussed the limitations, risks, security and privacy concerns of performing an evaluation and management service by telephone and the availability of in person appointments. The patient was identified with two identifiers. Pt.expressed understanding and agreed to proceed. Pt. Is at home. Dr. Livia Snellen is in his office.  Follow Up Instructions:   I discussed the assessment and treatment plan with the patient. The patient was provided an opportunity to ask questions and all were answered. The patient agreed with the plan and demonstrated an understanding of the instructions.   The patient was advised to call back or seek an in-person evaluation if the symptoms worsen or if the condition fails to improve as anticipated.   Total minutes including chart review and phone contact time: 21   Follow up plan: Return in about 1 month (around 06/27/2020), or if symptoms worsen or fail to improve.  Claretta Fraise, MD [place]

## 2020-05-29 ENCOUNTER — Telehealth: Payer: Self-pay | Admitting: *Deleted

## 2020-05-29 ENCOUNTER — Telehealth: Payer: Self-pay | Admitting: Family Medicine

## 2020-05-29 NOTE — Telephone Encounter (Signed)
Daughter will call [facility] to pursue getting in home help with her mother.  If she has difficulty or paper work, she will contact us again.

## 2020-05-29 NOTE — Telephone Encounter (Signed)
I oredered home health and CCM has already contacted them

## 2020-05-29 NOTE — Telephone Encounter (Signed)
Daughter is aware provider has ordered home health care and she should be receiving a call soon.

## 2020-05-29 NOTE — Telephone Encounter (Signed)
Daughter needs in home help immediately. Mom was released from re-hab but is bed bound and unable to go to bathroom.  Daughter can not manage .  Please do referral for help to come into home as fast as possible. Telephone visit was just done with provider.  Need help fast!

## 2020-05-29 NOTE — Telephone Encounter (Signed)
Daughter , [name] aware she should be getting a call shortly about help for her mother.   [facility] was notified that Dr. Livia Snellen is ordering [place] for patient.

## 2020-05-29 NOTE — Telephone Encounter (Signed)
[name] was never received on patient.

## 2020-05-30 ENCOUNTER — Emergency Department (HOSPITAL_COMMUNITY)
Admission: EM | Admit: 2020-05-30 | Discharge: 2020-05-31 | Disposition: A | Discharge status: Home / Self Care | Payer: Medicare HMO | Attending: Emergency Medicine | Admitting: Emergency Medicine

## 2020-05-30 ENCOUNTER — Other Ambulatory Visit: Payer: Self-pay

## 2020-05-30 ENCOUNTER — Emergency Department (HOSPITAL_COMMUNITY): Payer: Medicare HMO

## 2020-05-30 ENCOUNTER — Encounter (HOSPITAL_COMMUNITY): Payer: Self-pay | Admitting: Emergency Medicine

## 2020-05-30 DIAGNOSIS — D649 Anemia, unspecified: Secondary | ICD-10-CM | POA: Insufficient documentation

## 2020-05-30 DIAGNOSIS — I5032 Chronic diastolic (congestive) heart failure: Secondary | ICD-10-CM | POA: Insufficient documentation

## 2020-05-30 DIAGNOSIS — R1084 Generalized abdominal pain: Secondary | ICD-10-CM | POA: Diagnosis not present

## 2020-05-30 DIAGNOSIS — E1165 Type 2 diabetes mellitus with hyperglycemia: Secondary | ICD-10-CM | POA: Diagnosis not present

## 2020-05-30 DIAGNOSIS — I517 Cardiomegaly: Secondary | ICD-10-CM | POA: Diagnosis not present

## 2020-05-30 DIAGNOSIS — R630 Anorexia: Secondary | ICD-10-CM | POA: Diagnosis not present

## 2020-05-30 DIAGNOSIS — I4821 Permanent atrial fibrillation: Secondary | ICD-10-CM | POA: Insufficient documentation

## 2020-05-30 DIAGNOSIS — K5900 Constipation, unspecified: Secondary | ICD-10-CM | POA: Diagnosis not present

## 2020-05-30 DIAGNOSIS — R0902 Hypoxemia: Secondary | ICD-10-CM | POA: Diagnosis not present

## 2020-05-30 DIAGNOSIS — R109 Unspecified abdominal pain: Secondary | ICD-10-CM | POA: Diagnosis not present

## 2020-05-30 DIAGNOSIS — J449 Chronic obstructive pulmonary disease, unspecified: Secondary | ICD-10-CM | POA: Diagnosis not present

## 2020-05-30 DIAGNOSIS — J181 Lobar pneumonia, unspecified organism: Secondary | ICD-10-CM | POA: Diagnosis not present

## 2020-05-30 DIAGNOSIS — Z7951 Long term (current) use of inhaled steroids: Secondary | ICD-10-CM | POA: Diagnosis not present

## 2020-05-30 DIAGNOSIS — I2510 Atherosclerotic heart disease of native coronary artery without angina pectoris: Secondary | ICD-10-CM | POA: Insufficient documentation

## 2020-05-30 DIAGNOSIS — I130 Hypertensive heart and chronic kidney disease with heart failure and stage 1 through stage 4 chronic kidney disease, or unspecified chronic kidney disease: Secondary | ICD-10-CM | POA: Insufficient documentation

## 2020-05-30 DIAGNOSIS — R110 Nausea: Secondary | ICD-10-CM | POA: Insufficient documentation

## 2020-05-30 DIAGNOSIS — J189 Pneumonia, unspecified organism: Secondary | ICD-10-CM | POA: Diagnosis not present

## 2020-05-30 DIAGNOSIS — Z7984 Long term (current) use of oral hypoglycemic drugs: Secondary | ICD-10-CM | POA: Diagnosis not present

## 2020-05-30 DIAGNOSIS — K5901 Slow transit constipation: Secondary | ICD-10-CM | POA: Diagnosis not present

## 2020-05-30 DIAGNOSIS — E039 Hypothyroidism, unspecified: Secondary | ICD-10-CM | POA: Insufficient documentation

## 2020-05-30 DIAGNOSIS — N183 Chronic kidney disease, stage 3 unspecified: Secondary | ICD-10-CM | POA: Diagnosis not present

## 2020-05-30 DIAGNOSIS — E1140 Type 2 diabetes mellitus with diabetic neuropathy, unspecified: Secondary | ICD-10-CM | POA: Insufficient documentation

## 2020-05-30 DIAGNOSIS — R52 Pain, unspecified: Secondary | ICD-10-CM | POA: Diagnosis not present

## 2020-05-30 DIAGNOSIS — J90 Pleural effusion, not elsewhere classified: Secondary | ICD-10-CM | POA: Diagnosis not present

## 2020-05-30 DIAGNOSIS — I4891 Unspecified atrial fibrillation: Secondary | ICD-10-CM | POA: Diagnosis not present

## 2020-05-30 NOTE — ED Triage Notes (Signed)
Pt brought in by EMS for constipation x 1 month. States she has not had a BM in a month. Recently discharged from hospital with PNA. Pt complains of Lower abdominal pain and emesis when she attempts to eat.

## 2020-05-30 NOTE — ED Notes (Signed)
[place] Pt daughter

## 2020-05-31 DIAGNOSIS — J181 Lobar pneumonia, unspecified organism: Secondary | ICD-10-CM | POA: Diagnosis not present

## 2020-05-31 DIAGNOSIS — Z7401 Bed confinement status: Secondary | ICD-10-CM | POA: Diagnosis not present

## 2020-05-31 DIAGNOSIS — I517 Cardiomegaly: Secondary | ICD-10-CM | POA: Diagnosis not present

## 2020-05-31 DIAGNOSIS — R69 Illness, unspecified: Secondary | ICD-10-CM | POA: Diagnosis not present

## 2020-05-31 DIAGNOSIS — J90 Pleural effusion, not elsewhere classified: Secondary | ICD-10-CM | POA: Diagnosis not present

## 2020-05-31 DIAGNOSIS — K5900 Constipation, unspecified: Secondary | ICD-10-CM | POA: Diagnosis not present

## 2020-05-31 LAB — COMPREHENSIVE METABOLIC PANEL
ALT: 16 U/L (ref 0–44)
AST: 17 U/L (ref 15–41)
Albumin: 3.2 g/dL — ABNORMAL LOW (ref 3.5–5.0)
Alkaline Phosphatase: 136 U/L — ABNORMAL HIGH (ref 38–126)
Anion gap: 9 (ref 5–15)
BUN: 18 mg/dL (ref 8–23)
CO2: 34 mmol/L — ABNORMAL HIGH (ref 22–32)
Calcium: 8.5 mg/dL — ABNORMAL LOW (ref 8.9–10.3)
Chloride: 96 mmol/L — ABNORMAL LOW (ref 98–111)
Creatinine, Ser: 1.04 mg/dL — ABNORMAL HIGH (ref 0.44–1.00)
GFR calc Af Amer: >60 mL/min (ref >60)
GFR calc non Af Amer: 52 mL/min — ABNORMAL LOW (ref >60)
Glucose, Bld: 190 mg/dL — ABNORMAL HIGH (ref 70–99)
Potassium: 4 mmol/L (ref 3.5–5.1)
Sodium: 139 mmol/L (ref 135–145)
Total Bilirubin: 1.5 mg/dL — ABNORMAL HIGH (ref 0.3–1.2)
Total Protein: 6.6 g/dL (ref 6.5–8.1)

## 2020-05-31 LAB — CBC WITH DIFFERENTIAL/PLATELET
Abs Immature Granulocytes: 0.02 10*3/uL (ref 0.00–0.07)
Basophils Absolute: 0 10*3/uL (ref 0.0–0.1)
Basophils Relative: 0 %
Eosinophils Absolute: 0.3 10*3/uL (ref 0.0–0.5)
Eosinophils Relative: 4 %
HCT: 31.7 % — ABNORMAL LOW (ref 36.0–46.0)
Hemoglobin: 9.2 g/dL — ABNORMAL LOW (ref 12.0–15.0)
Immature Granulocytes: 0 %
Lymphocytes Relative: 15 %
Lymphs Abs: 1 10*3/uL (ref 0.7–4.0)
MCH: 29.8 pg (ref 26.0–34.0)
MCHC: 29 g/dL — ABNORMAL LOW (ref 30.0–36.0)
MCV: 102.6 fL — ABNORMAL HIGH (ref 80.0–100.0)
Monocytes Absolute: 0.8 10*3/uL (ref 0.1–1.0)
Monocytes Relative: 11 %
Neutro Abs: 5 10*3/uL (ref 1.7–7.7)
Neutrophils Relative %: 70 %
Platelets: 152 10*3/uL (ref 150–400)
RBC: 3.09 MIL/uL — ABNORMAL LOW (ref 3.87–5.11)
RDW: 16.7 % — ABNORMAL HIGH (ref 11.5–15.5)
WBC: 7.1 10*3/uL (ref 4.0–10.5)
nRBC: 0 % (ref 0.0–0.2)

## 2020-05-31 MED ORDER — GABAPENTIN 300 MG PO CAPS: 602.0000 mg | ORAL_CAPSULE | Freq: Every day | ORAL | Status: DC

## 2020-05-31 MED ORDER — POLYETHYLENE GLYCOL 3350 17 G PO PACK
17.0000 g | PACK | Freq: Every day | ORAL | 0 refills | Status: DC
Start: 2020-05-31 — End: 2020-11-26

## 2020-05-31 MED ORDER — ALBUTEROL SULFATE HFA 108 (90 BASE) MCG/ACT IN AERS: 2.0000 | INHALATION_SPRAY | Freq: Four times a day (QID) | RESPIRATORY_TRACT | Status: DC | PRN

## 2020-05-31 MED ORDER — TORSEMIDE 20 MG PO TABS
40.0000 mg | ORAL_TABLET | Freq: Every day | ORAL | Status: DC
  Administered 2020-05-31: 40 mg via ORAL
  Filled 2020-05-31 (×3): qty 2

## 2020-05-31 MED ORDER — APIXABAN 5 MG PO TABS
5.0000 mg | ORAL_TABLET | Freq: Two times a day (BID) | ORAL | Status: DC
  Administered 2020-05-31: 5 mg via ORAL
  Filled 2020-05-31: qty 1

## 2020-05-31 NOTE — ED Provider Notes (Signed)
SW notes home health is coming by today to see patient. Otherwise her fecal impaction was treated. Stable for d/c   Sherwood Gambler, MD 05/31/20 1021

## 2020-05-31 NOTE — ED Provider Notes (Signed)
[facility] EMERGENCY DEPARTMENT Provider Note   CSN: [id number] Arrival date & time: 05/30/20  2159     History Chief Complaint  Patient presents with  . Constipation    [name] is a 74 y.o. female.  The history is provided by the patient and a relative.  Constipation Severity:  Severe Time since last bowel movement: 1 month. Timing:  Constant Progression:  Worsening Chronicity:  New Relieved by:  Nothing Worsened by:  Nothing Associated symptoms: abdominal pain and nausea   Associated symptoms: no fever   Patient with extensive history including chronic atrial fibrillation, CAD, COPD on home oxygen, CHF presents with constipation.  Patient reports she has had no bowel movement for a month.  She reports passing flatus.  She reports having abdominal cramping.  No bloody stools.  She also reports nausea and decreased appetite.  Patient reports recent hospital stay     Past Medical History:  Diagnosis Date  . Anemia   . Arthritis   . Asthma   . Atrial fibrillation ([place])   . CAD (coronary artery disease)    LAD 60% proximal stenosis mid and distal 60% stenosis 2009.  . Cataract    had surgery  . CHF (congestive heart failure) ([place])   . COPD (chronic obstructive pulmonary disease) ([place])   . Diabetes mellitus   . Gait instability    uses cane  . GERD (gastroesophageal reflux disease)   . Hyperlipidemia   . Hypertension   . Obesity   . Oxygen dependent    2 liter per nasal cannula  . Sleep apnea, obstructive 2008  . Stroke [facility])    2 mini    Patient Active Problem List   Diagnosis Date Noted  . Acute on chronic diastolic (congestive) heart failure ([place]) 10/12/2019  . TIA (transient ischemic attack)   . Acute renal failure superimposed on stage 3 chronic kidney disease ([place])   . Dyspnea 06/23/2019  . Facial droop 06/22/2019  . Educated about COVID-19 virus infection 04/12/2019  . Leg swelling 04/12/2019  . Coronary artery disease involving native  coronary artery of native heart without angina pectoris 04/12/2019  . Hyperlipidemia 02/23/2018  . Current use of long term anticoagulation 08/26/2017  . Hypothyroidism 02/04/2017  . Long-term current use of insulin for diabetes mellitus ([place]) 08/15/2015  . Chronic renal insufficiency 08/15/2015  . Diabetic nephropathy associated with type 2 diabetes mellitus ([place]) 08/15/2015  . Osteoporosis 02/21/2015  . Normocytic anemia due to blood loss 05/09/2013  . Thrombocytopenia, unspecified ([place]) 05/09/2013  . Severe obesity (BMI >= 40) ([place]) 02/28/2008  . PULMONARY HYPERTENSION 02/28/2008  . Congestive heart failure ([place]) 02/28/2008  . PULMONARY NODULE, LEFT UPPER LOBE 02/28/2008  . OBSTRUCTIVE SLEEP APNEA 02/28/2008    Past Surgical History:  Procedure Laterality Date  . APPENDECTOMY    . CARPAL TUNNEL RELEASE     right hand  . CATARACT EXTRACTION W/ INTRAOCULAR LENS  IMPLANT, BILATERAL    . CERVICAL SPINE SURGERY     fusion  . CESAREAN SECTION    . CHOLECYSTECTOMY    . COLONOSCOPY  11/22/2002   normal - Dr.Rrourk  . COLONOSCOPY  11/26/2005   incomplete with negative BE (Dr. Rowe Pavy, [place])  . EYE SURGERY    . FEMUR IM NAIL Left 05/08/2013   Procedure: INTRAMEDULLARY (IM) NAIL FEMORAL--LONG(BIOMET SYSTEM) and Zimmer Cables;  Surgeon: Augustin Schooling, MD;  Location: [facility];  Service: Orthopedics;  Laterality: Left;  . HIP SURGERY Left   .  KNEE ARTHROSCOPY     twice, left  . RIGHT HEART CATH N/A 10/13/2019   Procedure: Luiz Blare Placement;  Surgeon: Jolaine Artist, MD;  Location: [facility];  Service: Cardiovascular;  Laterality: N/A;  . UMBILICAL HERNIA REPAIR    . UPPER GASTROINTESTINAL ENDOSCOPY  11/22/2002   Normal - Dr. Gala Romney     OB History   No obstetric history on file.     Family History  Problem Relation Age of Onset  . Colon cancer Mother   . Heart attack Mother   . Alzheimer's disease Mother        father  . Diabetes Mother   . Heart disease Mother   .  Cancer Mother        colon  . Kidney disease Mother   . Cancer Sister        brain and spine  . Migraines Sister   . Alzheimer's disease Father   . Cancer Brother        stomach  . Diabetes Sister   . Cancer Sister        breast  . Heart disease Sister   . Heart attack Sister   . Breast cancer Sister   . Heart disease Brother   . Heart murmur Brother   . Diabetes Other   . Heart attack Other   . Diabetes Daughter   . Asthma Daughter   . Asthma Grandchild     Social History   Tobacco Use  . Smoking status: Never Smoker  . Smokeless tobacco: Never Used  Vaping Use  . Vaping Use: Never used  Substance Use Topics  . Alcohol use: No  . Drug use: No    Home Medications Prior to Admission medications   Medication Sig Start Date End Date Taking? Authorizing Provider  Accu-Chek FastClix Lancets MISC Check BS 2 times daily Dx E11.9 11/04/19   Claretta Fraise, MD  ACCU-CHEK GUIDE test strip Please specify directions, refills and quantity 11/07/19   Claretta Fraise, MD  acetaminophen (TYLENOL) 500 MG tablet Take 500 mg by mouth every 4 (four) hours as needed for mild pain.     [provider]  albuterol (PROVENTIL) (2.5 MG/3ML) 0.083% nebulizer solution INHALE THE CONTENTS OF 1 VIAL by NEBULIZER EVERY 6 HOURS AS NEEDED FOR WHEEZING OR SHORTNESS OF BREATH 04/13/20   Claretta Fraise, MD  albuterol (VENTOLIN HFA) 108 (90 Base) MCG/ACT inhaler INHALE 2 PUFFS 4 TIMES A DAY AS DIRECTED 03/19/20   Claretta Fraise, MD  alendronate (FOSAMAX) 70 MG tablet TAKE ONE TABLET BY MOUTH ONCE WEEKLY ON SATURDAY Patient not taking: Reported on 03/22/2020 01/02/20   Claretta Fraise, MD  apixaban (ELIQUIS) 5 MG TABS tablet Take 1 tablet (5 mg total) by mouth 2 (two) times daily. 12/18/19   Claretta Fraise, MD  Dulaglutide (TRULICITY) 4.09 BD/5.3GD SOPN INJECT 0.5ML SUBCUTANEOUSLY ONCE WEEKLY ON SATURDAY 05/08/20   Claretta Fraise, MD  ferrous sulfate (FEROSUL) 325 (65 FE) MG tablet Take 1 tablet (325 mg  total) by mouth daily with breakfast. 05/08/20   Claretta Fraise, MD  fish oil-omega-3 fatty acids 1000 MG capsule Take 2 g by mouth daily.      [provider]  fluocinonide-emollient (LIDEX-E) 0.05 % cream Apply 1 application topically 2 (two) times daily. To affected areas Patient not taking: Reported on 11/10/2019 03/21/19   Claretta Fraise, MD  gabapentin (NEURONTIN) 300 MG capsule Take 2 capsules (602 mg total) by mouth at bedtime. 03/22/20   Stacks,  Cletus Gash, MD  glucose blood (ACCU-CHEK AVIVA PLUS) test strip Check BS 2 times daily Dx E11.9 11/04/19   Claretta Fraise, MD  insulin detemir (LEVEMIR FLEXTOUCH) 100 UNIT/ML FlexPen INJECT 40 UNITS SUBCUTANEOUSLY TWICE DAILY 04/09/20   Claretta Fraise, MD  Insulin Pen Needle (GLOBAL EASE INJECT PEN NEEDLES) 32G X 4 MM MISC NEW PRESCRIPTION REQUEST: PEN NEEDLES 4MMX32G - USE AS DIRECTED WITH INSULIN TWICE DAILY 02/13/20   Claretta Fraise, MD  ipratropium-albuterol (DUONEB) 0.5-2.5 (3) MG/3ML SOLN Inhale 3 mLs into the lungs every 6 (six) hours as needed (FOR SHORTNESS OF BREATH). 05/08/20   Claretta Fraise, MD  levothyroxine (SYNTHROID) 50 MCG tablet LEVOTHYROXINE 50MCG - TAKE ONE TABLET BY MOUTH DAILY Patient not taking: Reported on 03/22/2020 12/18/19   Claretta Fraise, MD  macitentan (OPSUMIT) 10 MG tablet Take 1 tablet (10 mg total) by mouth daily. Patient not taking: Reported on 03/22/2020 10/25/19   Darrick Grinder D, NP  midodrine (PROAMATINE) 10 MG tablet Take 1 tablet (10 mg total) by mouth 3 (three) times daily with meals. Patient not taking: Reported on 03/22/2020 11/08/19   Darrick Grinder D, NP  ondansetron (ZOFRAN-ODT) 8 MG disintegrating tablet Take 1 tablet (8 mg total) by mouth every 6 (six) hours as needed for nausea or vomiting. 05/28/20   Claretta Fraise, MD  OXYGEN Inhale 2 L into the lungs continuous.    [provider]  potassium chloride SA (KLOR-CON) 20 MEQ tablet Take 1 tablet (20 mEq total) by mouth daily. 11/08/19   Clegg, Amy D, NP    rivastigmine (EXELON) 4.6 mg/24hr Place 1 patch (4.6 mg total) onto the skin daily. 03/22/20   Claretta Fraise, MD  sildenafil (REVATIO) 20 MG tablet Take 2 tablets (40 mg total) by mouth 3 (three) times daily. Patient not taking: Reported on 03/22/2020 11/08/19   Darrick Grinder D, NP  spironolactone (ALDACTONE) 25 MG tablet Take 0.5 tablets (12.5 mg total) by mouth daily. Patient not taking: Reported on 03/22/2020 12/18/19   Claretta Fraise, MD  torsemide (DEMADEX) 20 MG tablet TAKE TWO TABLETS BY MOUTH DAILY 03/09/20   Bensimhon, Shaune Pascal, MD  trazodone (DESYREL) 300 MG tablet Take 1 tablet (300 mg total) by mouth at bedtime. Patient not taking: Reported on 03/22/2020 12/18/19   Claretta Fraise, MD    Allergies    Lisinopril and Codeine  Review of Systems   Review of Systems  Constitutional: Positive for appetite change. Negative for fever.  Respiratory:       Chronic SOB  Cardiovascular: Negative for chest pain.  Gastrointestinal: Positive for abdominal pain, constipation and nausea.  All other systems reviewed and are negative.   Physical Exam Updated Vital Signs BP (!) 135/49 (BP Location: Left Arm)   Pulse 79   Temp 98 F (36.7 C) (Oral)   Resp 18   Ht 1.651 m (5\' 5" )   Wt 127 kg   SpO2 100%   BMI 46.59 kg/m   Physical Exam CONSTITUTIONAL: Elderly, chronically ill-appearing HEAD: Normocephalic/atraumatic EYES: EOMI/PERRL ENMT: Mucous membranes moist NECK: supple no meningeal signs SPINE/BACK:entire spine nontender CV: Irregular no loud murmur LUNGS: Decreased lung sounds bilaterally no apparent distress ABDOMEN: soft, nontender, no rebound or guarding, bowel sounds noted throughout abdomen Rectal-stool impaction noted.  No rectal mass.  Stool is brown, no blood or melena.  Nurse Lonn Georgia present for exam NEURO: Pt is awake/alert/appropriate, moves all extremitiesx4.  No facial droop.  EXTREMITIES: pulses normal/equal, full ROM, chronic edema lower extremities SKIN: warm, color  normal  PSYCH: no abnormalities of mood noted, alert and oriented to situation  ED Results / Procedures / Treatments   Labs (all labs ordered are listed, but only abnormal results are displayed) Labs Reviewed  CBC WITH DIFFERENTIAL/PLATELET - Abnormal; Notable for the following components:      Result Value   RBC 3.09 (*)    Hemoglobin 9.2 (*)    HCT 31.7 (*)    MCV 102.6 (*)    MCHC 29.0 (*)    RDW 16.7 (*)    All other components within normal limits  COMPREHENSIVE METABOLIC PANEL - Abnormal; Notable for the following components:   Chloride 96 (*)    CO2 34 (*)    Glucose, Bld 190 (*)    Creatinine, Ser 1.04 (*)    Calcium 8.5 (*)    Albumin 3.2 (*)    Alkaline Phosphatase 136 (*)    Total Bilirubin 1.5 (*)    GFR calc non Af Amer 52 (*)    All other components within normal limits  URINALYSIS, ROUTINE W REFLEX MICROSCOPIC    EKG EKG Interpretation  Date/Time:  Thursday May 31 2020 00:33:22 EDT Ventricular Rate:  68 PR Interval:    QRS Duration: 76 QT Interval:  596 QTC Calculation: 634 R Axis:   59 Text Interpretation: Atrial fibrillation Low voltage, extremity leads Prolonged QT interval Confirmed by Ripley Fraise [phone]) on 05/31/2020 12:48:46 AM   Radiology DG Abdomen Acute W/Chest  Result Date: 05/31/2020 CLINICAL DATA:  Lower abdominal pain, constipation for 1 month, emesis EXAM: DG ABDOMEN ACUTE W/ 1V CHEST COMPARISON:  05/18/2020 FINDINGS: Supine and upright frontal views of the abdomen as well as an upright frontal view of the chest are obtained. Cardiac silhouette remains enlarged. There is persistent vascular congestion with bilateral pleural effusions. Retrocardiac consolidation may reflect atelectasis. Bowel gas pattern is unremarkable without obstruction or ileus. Moderate stool throughout the colon. No free gas in the greater peritoneal sac. No masses or abnormal calcifications. There are no acute bony abnormalities. IMPRESSION: 1. Unremarkable bowel gas  pattern. 2. Moderate fecal retention. 3. Stable vascular congestion and bilateral effusions consistent with congestive heart failure. Electronically Signed   By: Randa Ngo M.D.   On: 05/31/2020 01:02    Procedures Fecal disimpaction  Date/Time: 05/31/2020 1:45 AM Performed by: Ripley Fraise, MD Authorized by: Ripley Fraise, MD  Local anesthesia used: no  Anesthesia: Local anesthesia used: no  Sedation: Patient sedated: no  Patient tolerance: patient tolerated the procedure well with no immediate complications Comments: Large amount of stool was removed.  No blood or melena.  No immediate complications Nurse Lonn Georgia present for exam     Medications Ordered in ED Medications  albuterol (VENTOLIN HFA) 108 (90 Base) MCG/ACT inhaler 2 puff (has no administration in time range)  apixaban (ELIQUIS) tablet 5 mg (has no administration in time range)  gabapentin (NEURONTIN) capsule 602 mg (has no administration in time range)  torsemide (DEMADEX) tablet 40 mg (has no administration in time range)    ED Course  I have reviewed the triage vital signs and the nursing notes.  Pertinent labs & imaging results that were available during my care of the patient were reviewed by me and considered in my medical decision making (see chart for details).    MDM Rules/Calculators/A&P                         Patient presents from home from constipation for a month.  She reports abdominal cramping, but has no focal abdominal tenderness.  She is in no acute distress. Patient has large amount of fecal impaction that was removed.  X-ray was reviewed and no acute findings except persistent effusions.  No bowel obstruction.  Labs appear near baseline.  1:46 AM. D/w daughter [name]  She reports patient is very difficult to manage at home.  Home health referral was never apparently set up after recent hospital stay at [facility]  She is at least requesting home health referral prior to discharge  from the ER.  Patient may have already used up her rehab days. Patient is otherwise stable at this time. No other acute emergency at this time Patient's daughter [name] and son-in-law Lanny Hurst can be reached at [phone].  Case management consult is pending at this time Final Clinical Impression(s) / ED Diagnoses Final diagnoses:  Slow transit constipation  Chronic anemia  Permanent atrial fibrillation [facility])    Rx / DC Orders ED Discharge Orders    None       Ripley Fraise, MD 05/31/20 [phone]

## 2020-05-31 NOTE — TOC Progression Note (Signed)
Transition of Care [facility]) - Progression Note    Patient Details  Name: ANJALI MANZELLA MRN: [id number] Date of Birth: Dec 17, 1945  Transition of Care [facility]) CM/SW Contact  Salome Arnt, [place] Phone Number: 05/31/2020, 10:41 AM  Clinical Narrative: TOC received consult for home health. LCSW left voicemail for pt's daughter, [name]. LCSW reviewed chart and noted that PCP had arranged home health earlier this week. LCSW called [facility] and was told that they contacted [facility] yesterday and notified her of plan for start of care today. Orders entered for PT, OT, RN, aide, respiratory, and social work. Per [facility], no RT, but RN will assess. LCSW updated EDP that home health will start today.       Expected Discharge Plan: [place]    Expected Discharge Plan and Services Expected Discharge Plan: [place]                                               Social Determinants of Health (SDOH) Interventions    Readmission Risk Interventions No flowsheet data found.

## 2020-05-31 NOTE — ED Notes (Signed)
Patient denies pain and is resting comfortably.  

## 2020-06-01 ENCOUNTER — Ambulatory Visit (INDEPENDENT_AMBULATORY_CARE_PROVIDER_SITE_OTHER): Payer: Medicare HMO | Admitting: *Deleted

## 2020-06-01 DIAGNOSIS — I5033 Acute on chronic diastolic (congestive) heart failure: Secondary | ICD-10-CM | POA: Diagnosis not present

## 2020-06-01 DIAGNOSIS — I130 Hypertensive heart and chronic kidney disease with heart failure and stage 1 through stage 4 chronic kidney disease, or unspecified chronic kidney disease: Secondary | ICD-10-CM | POA: Diagnosis not present

## 2020-06-01 DIAGNOSIS — J440 Chronic obstructive pulmonary disease with acute lower respiratory infection: Secondary | ICD-10-CM | POA: Diagnosis not present

## 2020-06-01 DIAGNOSIS — E1140 Type 2 diabetes mellitus with diabetic neuropathy, unspecified: Secondary | ICD-10-CM

## 2020-06-01 DIAGNOSIS — N183 Chronic kidney disease, stage 3 unspecified: Secondary | ICD-10-CM | POA: Diagnosis not present

## 2020-06-01 DIAGNOSIS — J189 Pneumonia, unspecified organism: Secondary | ICD-10-CM | POA: Diagnosis not present

## 2020-06-01 DIAGNOSIS — T83511D Infection and inflammatory reaction due to indwelling urethral catheter, subsequent encounter: Secondary | ICD-10-CM | POA: Diagnosis not present

## 2020-06-01 DIAGNOSIS — D631 Anemia in chronic kidney disease: Secondary | ICD-10-CM | POA: Diagnosis not present

## 2020-06-01 DIAGNOSIS — J9621 Acute and chronic respiratory failure with hypoxia: Secondary | ICD-10-CM | POA: Diagnosis not present

## 2020-06-01 DIAGNOSIS — I5022 Chronic systolic (congestive) heart failure: Secondary | ICD-10-CM

## 2020-06-01 DIAGNOSIS — E1122 Type 2 diabetes mellitus with diabetic chronic kidney disease: Secondary | ICD-10-CM | POA: Diagnosis not present

## 2020-06-01 NOTE — Patient Instructions (Signed)
Visit Information  Goals Addressed              This Visit's Progress     Patient Stated   .  "We need home health and in-home care services" (pt-stated)        [place] (see longitudinal plan of care for additional care plan information)  Current Barriers:  . Care Coordination needs related to [place] scheduling in a patient with CHF, DM, CKD (disease states) . Transportation barriers  Nurse Case Manager Clinical Goal(s):  Marland Kitchen Over the next 24 hours, patient will have assessment by [facility] nurse  Interventions:  . Inter-disciplinary care team collaboration (see longitudinal plan of care) . Chart reviewed including recent office notes, hospital discharge summary, and ED notes . Reached out to [facility] at [phone] o Verified that they are scheduled to go out for an assessment today . Spoke with daughter by telephone . Verified that [place] is coming out today at 3:00 . Discussed in-home care needs  o Toileing, bathing, personal hygiene, etc . Discussed that daughter works and is having difficulty finding caregivers . Reached out to [facility] o They are still not servicing [facility] . Discussed ADTS in-home care options for [facility] and resource coordinator contact # [phone] . Encouraged daughter to talk with [facility] RN regarding personal care needs so that they can create a care plan for a nurse aide . Encouraged to reach out to [facility] as needed  Patient Self Care Activities:  . Self administers medications as prescribed . Attends all scheduled provider appointments . Unable to independently drive to appointments . Unable to perform IADLs independently  Please see past updates related to this goal by clicking on the "Past Updates" button in the selected goal         Patient verbalizes understanding of instructions provided today.   Follow-up Plan The care management team will reach out to the  patient again over the next 7 days.   Chong Sicilian, BSN, RN-BC Embedded Chronic Care Manager [facility] / [place] Management Direct Dial: [phone]

## 2020-06-01 NOTE — Chronic Care Management (AMB) (Signed)
Chronic Care Management   Follow Up Note   06/01/2020 Name: [name] MRN: [id number] DOB: Jan 28, 1946  Referred by: Claretta Fraise, MD Reason for referral : Chronic Care Management (RN Follow up)   [name] is a 74 y.o. year old female who is a primary care patient of Stacks, Cletus Gash, MD. The CCM team was consulted for assistance with chronic disease management and care coordination needs.    Review of patient status, including review of consultants reports, relevant laboratory and other test results, and collaboration with appropriate care team members and the patient's provider was performed as part of comprehensive patient evaluation and provision of chronic care management services.    I spoke with [name] daughter, [name], by telephone today regarding her current in-home care needs.   SDOH (Social Determinants of Health) assessments performed: Yes See Care Plan activities for detailed interventions related to [facility])     Outpatient Encounter Medications as of 06/01/2020  Medication Sig  . Accu-Chek FastClix Lancets MISC Check BS 2 times daily Dx E11.9  . ACCU-CHEK GUIDE test strip Please specify directions, refills and quantity (Patient taking differently: 1 each by Other route as needed. )  . acetaminophen (TYLENOL) 500 MG tablet Take 500 mg by mouth every 4 (four) hours as needed for mild pain.   Marland Kitchen albuterol (PROVENTIL) (2.5 MG/3ML) 0.083% nebulizer solution INHALE THE CONTENTS OF 1 VIAL by NEBULIZER EVERY 6 HOURS AS NEEDED FOR WHEEZING OR SHORTNESS OF BREATH (Patient taking differently: Take 2.5 mg by nebulization every 6 (six) hours as needed for wheezing or shortness of breath. )  . albuterol (VENTOLIN HFA) 108 (90 Base) MCG/ACT inhaler INHALE 2 PUFFS 4 TIMES A DAY AS DIRECTED  . apixaban (ELIQUIS) 5 MG TABS tablet Take 1 tablet (5 mg total) by mouth 2 (two) times daily.  . ciprofloxacin (CIPRO) 500 MG tablet Take 500 mg by mouth 2 (two) times daily.  .  Dulaglutide (TRULICITY) 4.76 LY/6.5KP SOPN INJECT 0.5ML SUBCUTANEOUSLY ONCE WEEKLY ON SATURDAY  . ferrous sulfate (FEROSUL) 325 (65 FE) MG tablet Take 1 tablet (325 mg total) by mouth daily with breakfast.  . fish oil-omega-3 fatty acids 1000 MG capsule Take 2 g by mouth daily.    Marland Kitchen gabapentin (NEURONTIN) 300 MG capsule Take 2 capsules (602 mg total) by mouth at bedtime.  Marland Kitchen glucose blood (ACCU-CHEK AVIVA PLUS) test strip Check BS 2 times daily Dx E11.9  . insulin detemir (LEVEMIR FLEXTOUCH) 100 UNIT/ML FlexPen INJECT 40 UNITS SUBCUTANEOUSLY TWICE DAILY  . Insulin Pen Needle (GLOBAL EASE INJECT PEN NEEDLES) 32G X 4 MM MISC NEW PRESCRIPTION REQUEST: PEN NEEDLES 4MMX32G - USE AS DIRECTED WITH INSULIN TWICE DAILY  . ipratropium-albuterol (DUONEB) 0.5-2.5 (3) MG/3ML SOLN Inhale 3 mLs into the lungs every 6 (six) hours as needed (FOR SHORTNESS OF BREATH).  Marland Kitchen levothyroxine (SYNTHROID) 50 MCG tablet LEVOTHYROXINE 50MCG - TAKE ONE TABLET BY MOUTH DAILY  . macitentan (OPSUMIT) 10 MG tablet Take 1 tablet (10 mg total) by mouth daily.  . metFORMIN (GLUCOPHAGE-XR) 750 MG 24 hr tablet Take 1 tablet by mouth daily.  . midodrine (PROAMATINE) 10 MG tablet Take 1 tablet (10 mg total) by mouth 3 (three) times daily with meals.  . ondansetron (ZOFRAN-ODT) 8 MG disintegrating tablet Take 1 tablet (8 mg total) by mouth every 6 (six) hours as needed for nausea or vomiting.  . OXYGEN Inhale 2 L into the lungs continuous.  . polyethylene glycol (MIRALAX / GLYCOLAX) 17 g packet Take 17  g by mouth daily.  . potassium chloride SA (KLOR-CON) 20 MEQ tablet Take 1 tablet (20 mEq total) by mouth daily.  . rivastigmine (EXELON) 4.6 mg/24hr Place 1 patch (4.6 mg total) onto the skin daily.  . sildenafil (REVATIO) 20 MG tablet Take 2 tablets (40 mg total) by mouth 3 (three) times daily.  Marland Kitchen spironolactone (ALDACTONE) 25 MG tablet Take 0.5 tablets (12.5 mg total) by mouth daily.  Marland Kitchen torsemide (DEMADEX) 20 MG tablet TAKE TWO TABLETS BY  MOUTH DAILY  . vitamin B-12 (CYANOCOBALAMIN) 500 MCG tablet Take 500 mcg by mouth daily.   No facility-administered encounter medications on file as of 06/01/2020.     RN Care Plan   .  "We need home health and in-home care services" (pt-stated)        [place] (see longitudinal plan of care for additional care plan information)  Current Barriers:  . Care Coordination needs related to [place] scheduling in a patient with CHF, DM, CKD (disease states) . Transportation barriers  Nurse Case Manager Clinical Goal(s):  Marland Kitchen Over the next 24 hours, patient will have assessment by [facility] nurse  Interventions:  . Inter-disciplinary care team collaboration (see longitudinal plan of care) . Chart reviewed including recent office notes, hospital discharge summary, and ED notes . Reached out to [facility] at [phone] o Verified that they are scheduled to go out for an assessment today . Spoke with daughter by telephone . Verified that [place] is coming out today at 3:00 . Discussed in-home care needs  o Toileing, bathing, personal hygiene, etc . Discussed that daughter works and is having difficulty finding caregivers . Reached out to [facility] o They are still not servicing [facility] . Discussed ADTS in-home care options for [facility] and resource coordinator contact # [phone] . Encouraged daughter to talk with [facility] RN regarding personal care needs so that they can create a care plan for a nurse aide . Encouraged to reach out to [facility] as needed  Patient Self Care Activities:  . Self administers medications as prescribed . Attends all scheduled provider appointments . Unable to independently drive to appointments . Unable to perform IADLs independently  Please see past updates related to this goal by clicking on the "Past Updates" button in the selected goal          Plan:  The care management team will reach out  to the patient again over the next 7 days.    Chong Sicilian, BSN, RN-BC Embedded Chronic Care Manager [facility] / [place] Management Direct Dial: [phone]

## 2020-06-05 ENCOUNTER — Ambulatory Visit: Payer: Medicare HMO | Admitting: *Deleted

## 2020-06-05 DIAGNOSIS — I5022 Chronic systolic (congestive) heart failure: Secondary | ICD-10-CM

## 2020-06-06 DIAGNOSIS — R0689 Other abnormalities of breathing: Secondary | ICD-10-CM | POA: Diagnosis not present

## 2020-06-06 DIAGNOSIS — R1032 Left lower quadrant pain: Secondary | ICD-10-CM | POA: Diagnosis not present

## 2020-06-06 DIAGNOSIS — R600 Localized edema: Secondary | ICD-10-CM | POA: Diagnosis not present

## 2020-06-06 DIAGNOSIS — E1122 Type 2 diabetes mellitus with diabetic chronic kidney disease: Secondary | ICD-10-CM | POA: Diagnosis not present

## 2020-06-06 DIAGNOSIS — D692 Other nonthrombocytopenic purpura: Secondary | ICD-10-CM | POA: Diagnosis not present

## 2020-06-06 DIAGNOSIS — R1084 Generalized abdominal pain: Secondary | ICD-10-CM | POA: Diagnosis not present

## 2020-06-06 DIAGNOSIS — R52 Pain, unspecified: Secondary | ICD-10-CM | POA: Diagnosis not present

## 2020-06-06 DIAGNOSIS — N189 Chronic kidney disease, unspecified: Secondary | ICD-10-CM | POA: Diagnosis not present

## 2020-06-06 DIAGNOSIS — N183 Chronic kidney disease, stage 3 unspecified: Secondary | ICD-10-CM | POA: Diagnosis not present

## 2020-06-06 DIAGNOSIS — Z743 Need for continuous supervision: Secondary | ICD-10-CM | POA: Diagnosis not present

## 2020-06-06 DIAGNOSIS — R0902 Hypoxemia: Secondary | ICD-10-CM | POA: Diagnosis not present

## 2020-06-06 DIAGNOSIS — E1151 Type 2 diabetes mellitus with diabetic peripheral angiopathy without gangrene: Secondary | ICD-10-CM | POA: Diagnosis not present

## 2020-06-06 DIAGNOSIS — Z6841 Body Mass Index (BMI) 40.0 and over, adult: Secondary | ICD-10-CM | POA: Diagnosis not present

## 2020-06-06 DIAGNOSIS — R1030 Lower abdominal pain, unspecified: Secondary | ICD-10-CM | POA: Diagnosis not present

## 2020-06-06 DIAGNOSIS — G8220 Paraplegia, unspecified: Secondary | ICD-10-CM | POA: Diagnosis not present

## 2020-06-06 DIAGNOSIS — R5381 Other malaise: Secondary | ICD-10-CM | POA: Diagnosis not present

## 2020-06-06 DIAGNOSIS — Z79899 Other long term (current) drug therapy: Secondary | ICD-10-CM | POA: Diagnosis not present

## 2020-06-06 DIAGNOSIS — Z794 Long term (current) use of insulin: Secondary | ICD-10-CM | POA: Diagnosis not present

## 2020-06-06 DIAGNOSIS — I509 Heart failure, unspecified: Secondary | ICD-10-CM | POA: Diagnosis not present

## 2020-06-06 DIAGNOSIS — E079 Disorder of thyroid, unspecified: Secondary | ICD-10-CM | POA: Diagnosis not present

## 2020-06-06 DIAGNOSIS — Z885 Allergy status to narcotic agent status: Secondary | ICD-10-CM | POA: Diagnosis not present

## 2020-06-06 DIAGNOSIS — R69 Illness, unspecified: Secondary | ICD-10-CM | POA: Diagnosis not present

## 2020-06-06 DIAGNOSIS — R112 Nausea with vomiting, unspecified: Secondary | ICD-10-CM | POA: Diagnosis not present

## 2020-06-06 DIAGNOSIS — I130 Hypertensive heart and chronic kidney disease with heart failure and stage 1 through stage 4 chronic kidney disease, or unspecified chronic kidney disease: Secondary | ICD-10-CM | POA: Diagnosis not present

## 2020-06-06 DIAGNOSIS — I25118 Atherosclerotic heart disease of native coronary artery with other forms of angina pectoris: Secondary | ICD-10-CM | POA: Diagnosis not present

## 2020-06-06 DIAGNOSIS — I959 Hypotension, unspecified: Secondary | ICD-10-CM | POA: Diagnosis not present

## 2020-06-07 ENCOUNTER — Ambulatory Visit: Payer: Medicare HMO | Admitting: *Deleted

## 2020-06-07 ENCOUNTER — Telehealth: Payer: Self-pay | Admitting: *Deleted

## 2020-06-07 ENCOUNTER — Other Ambulatory Visit: Payer: Self-pay | Admitting: Family Medicine

## 2020-06-07 DIAGNOSIS — J189 Pneumonia, unspecified organism: Secondary | ICD-10-CM | POA: Diagnosis not present

## 2020-06-07 DIAGNOSIS — I130 Hypertensive heart and chronic kidney disease with heart failure and stage 1 through stage 4 chronic kidney disease, or unspecified chronic kidney disease: Secondary | ICD-10-CM | POA: Diagnosis not present

## 2020-06-07 DIAGNOSIS — E1140 Type 2 diabetes mellitus with diabetic neuropathy, unspecified: Secondary | ICD-10-CM

## 2020-06-07 DIAGNOSIS — N183 Chronic kidney disease, stage 3 unspecified: Secondary | ICD-10-CM | POA: Diagnosis not present

## 2020-06-07 DIAGNOSIS — I5022 Chronic systolic (congestive) heart failure: Secondary | ICD-10-CM

## 2020-06-07 DIAGNOSIS — I5033 Acute on chronic diastolic (congestive) heart failure: Secondary | ICD-10-CM | POA: Diagnosis not present

## 2020-06-07 DIAGNOSIS — E1122 Type 2 diabetes mellitus with diabetic chronic kidney disease: Secondary | ICD-10-CM | POA: Diagnosis not present

## 2020-06-07 DIAGNOSIS — J431 Panlobular emphysema: Secondary | ICD-10-CM

## 2020-06-07 DIAGNOSIS — J440 Chronic obstructive pulmonary disease with acute lower respiratory infection: Secondary | ICD-10-CM | POA: Diagnosis not present

## 2020-06-07 DIAGNOSIS — J9621 Acute and chronic respiratory failure with hypoxia: Secondary | ICD-10-CM | POA: Diagnosis not present

## 2020-06-07 DIAGNOSIS — D631 Anemia in chronic kidney disease: Secondary | ICD-10-CM | POA: Diagnosis not present

## 2020-06-07 DIAGNOSIS — T83511D Infection and inflammatory reaction due to indwelling urethral catheter, subsequent encounter: Secondary | ICD-10-CM | POA: Diagnosis not present

## 2020-06-07 NOTE — Chronic Care Management (AMB) (Signed)
  Chronic Care Management   Care Coordination Note  06/05/2020 Name: [name] MRN: [id number] DOB: [birth year]  Unsuccessful telephone follow-up. RNCM has reached out to [facility] and waiting on reply. Per the most recent roster that I have been provided, patient is currentlly engaged with [facility]. They will be able to provide in-home assessments and possibly assist with in-home care needs.   Follow up plan: The care management team will reach out to the patient again over the next 5 days.   Chong Sicilian, BSN, RN-BC Embedded Chronic Care Manager [facility] / [place] Management Direct Dial: [phone]

## 2020-06-07 NOTE — Telephone Encounter (Signed)
06/07/2020  Patient needs face-to-face visit with Dr Livia Snellen for DME orders.   Forwarding to [facility] Clinical staff to contact patient's daughter, [name], to schedule.   Chong Sicilian, BSN, RN-BC Embedded Chronic Care Manager [facility] [place] Management Direct Dial: [phone]

## 2020-06-08 ENCOUNTER — Telehealth: Payer: Self-pay | Admitting: *Deleted

## 2020-06-08 DIAGNOSIS — I130 Hypertensive heart and chronic kidney disease with heart failure and stage 1 through stage 4 chronic kidney disease, or unspecified chronic kidney disease: Secondary | ICD-10-CM | POA: Diagnosis not present

## 2020-06-08 DIAGNOSIS — E1122 Type 2 diabetes mellitus with diabetic chronic kidney disease: Secondary | ICD-10-CM | POA: Diagnosis not present

## 2020-06-08 DIAGNOSIS — D631 Anemia in chronic kidney disease: Secondary | ICD-10-CM | POA: Diagnosis not present

## 2020-06-08 DIAGNOSIS — J440 Chronic obstructive pulmonary disease with acute lower respiratory infection: Secondary | ICD-10-CM | POA: Diagnosis not present

## 2020-06-08 DIAGNOSIS — J189 Pneumonia, unspecified organism: Secondary | ICD-10-CM | POA: Diagnosis not present

## 2020-06-08 DIAGNOSIS — J9621 Acute and chronic respiratory failure with hypoxia: Secondary | ICD-10-CM | POA: Diagnosis not present

## 2020-06-08 DIAGNOSIS — T83511D Infection and inflammatory reaction due to indwelling urethral catheter, subsequent encounter: Secondary | ICD-10-CM | POA: Diagnosis not present

## 2020-06-08 DIAGNOSIS — N183 Chronic kidney disease, stage 3 unspecified: Secondary | ICD-10-CM | POA: Diagnosis not present

## 2020-06-08 DIAGNOSIS — I5033 Acute on chronic diastolic (congestive) heart failure: Secondary | ICD-10-CM | POA: Diagnosis not present

## 2020-06-08 NOTE — Telephone Encounter (Signed)
Patient has a follow up appointment scheduled. 

## 2020-06-08 NOTE — Telephone Encounter (Signed)
Mallory from [facility] left a VM stating that pt is only OT evaluation as she is not a candidate for Occupational Therapy at home at this time but after PT has worked with her for a few weeks she may be a candidate for OT and would just need new orders.

## 2020-06-11 ENCOUNTER — Telehealth (HOSPITAL_COMMUNITY): Payer: Self-pay

## 2020-06-11 DIAGNOSIS — I5033 Acute on chronic diastolic (congestive) heart failure: Secondary | ICD-10-CM | POA: Diagnosis not present

## 2020-06-11 DIAGNOSIS — J440 Chronic obstructive pulmonary disease with acute lower respiratory infection: Secondary | ICD-10-CM | POA: Diagnosis not present

## 2020-06-11 DIAGNOSIS — J189 Pneumonia, unspecified organism: Secondary | ICD-10-CM | POA: Diagnosis not present

## 2020-06-11 DIAGNOSIS — T83511D Infection and inflammatory reaction due to indwelling urethral catheter, subsequent encounter: Secondary | ICD-10-CM | POA: Diagnosis not present

## 2020-06-11 DIAGNOSIS — I130 Hypertensive heart and chronic kidney disease with heart failure and stage 1 through stage 4 chronic kidney disease, or unspecified chronic kidney disease: Secondary | ICD-10-CM | POA: Diagnosis not present

## 2020-06-11 DIAGNOSIS — N183 Chronic kidney disease, stage 3 unspecified: Secondary | ICD-10-CM | POA: Diagnosis not present

## 2020-06-11 DIAGNOSIS — E1122 Type 2 diabetes mellitus with diabetic chronic kidney disease: Secondary | ICD-10-CM | POA: Diagnosis not present

## 2020-06-11 DIAGNOSIS — J9621 Acute and chronic respiratory failure with hypoxia: Secondary | ICD-10-CM | POA: Diagnosis not present

## 2020-06-11 DIAGNOSIS — D631 Anemia in chronic kidney disease: Secondary | ICD-10-CM | POA: Diagnosis not present

## 2020-06-11 NOTE — Chronic Care Management (AMB) (Signed)
Chronic Care Management   Follow Up Note   06/07/2020 Name: [name] MRN: [id number] DOB: [birth year]  Referred by: Claretta Fraise, MD Reason for referral : Chronic Care Management (RN follow up)   [name] is a 74 y.o. year old female who is a primary care patient of Stacks, Cletus Gash, MD. The CCM team was consulted for assistance with chronic disease management and care coordination needs.    Review of patient status, including review of consultants reports, relevant laboratory and other test results, and collaboration with appropriate care team members and the patient's provider was performed as part of comprehensive patient evaluation and provision of chronic care management services.    SDOH (Social Determinants of Health) assessments performed: No See Care Plan activities for detailed interventions related to [facility])     Outpatient Encounter Medications as of 06/07/2020  Medication Sig   Accu-Chek FastClix Lancets MISC Check BS 2 times daily Dx E11.9   ACCU-CHEK GUIDE test strip Please specify directions, refills and quantity (Patient taking differently: 1 each by Other route as needed. )   acetaminophen (TYLENOL) 500 MG tablet Take 500 mg by mouth every 4 (four) hours as needed for mild pain.    albuterol (VENTOLIN HFA) 108 (90 Base) MCG/ACT inhaler INHALE 2 PUFFS 4 TIMES A DAY AS DIRECTED   apixaban (ELIQUIS) 5 MG TABS tablet Take 1 tablet (5 mg total) by mouth 2 (two) times daily.   ciprofloxacin (CIPRO) 500 MG tablet Take 500 mg by mouth 2 (two) times daily.   Dulaglutide (TRULICITY) 2.13 YQ/6.5HQ SOPN INJECT 0.5ML SUBCUTANEOUSLY ONCE WEEKLY ON SATURDAY   ferrous sulfate (FEROSUL) 325 (65 FE) MG tablet Take 1 tablet (325 mg total) by mouth daily with breakfast.   fish oil-omega-3 fatty acids 1000 MG capsule Take 2 g by mouth daily.     gabapentin (NEURONTIN) 300 MG capsule Take 2 capsules (602 mg total) by mouth at bedtime.   glucose blood (ACCU-CHEK AVIVA  PLUS) test strip Check BS 2 times daily Dx E11.9   insulin detemir (LEVEMIR FLEXTOUCH) 100 UNIT/ML FlexPen INJECT 40 UNITS SUBCUTANEOUSLY TWICE DAILY   Insulin Pen Needle (GLOBAL EASE INJECT PEN NEEDLES) 32G X 4 MM MISC NEW PRESCRIPTION REQUEST: PEN NEEDLES 4MMX32G - USE AS DIRECTED WITH INSULIN TWICE DAILY   ipratropium-albuterol (DUONEB) 0.5-2.5 (3) MG/3ML SOLN INHALE THE CONTENTS OF 1 VIAL INTO THE LUNGS VIA NEBULIZER EVERY 6 HOURS AS NEEDED FOR SHORTNESS OF BREATH   levothyroxine (SYNTHROID) 50 MCG tablet LEVOTHYROXINE 50MCG - TAKE ONE TABLET BY MOUTH DAILY   macitentan (OPSUMIT) 10 MG tablet Take 1 tablet (10 mg total) by mouth daily.   metFORMIN (GLUCOPHAGE-XR) 750 MG 24 hr tablet Take 1 tablet by mouth daily.   midodrine (PROAMATINE) 10 MG tablet Take 1 tablet (10 mg total) by mouth 3 (three) times daily with meals.   ondansetron (ZOFRAN-ODT) 8 MG disintegrating tablet Take 1 tablet (8 mg total) by mouth every 6 (six) hours as needed for nausea or vomiting.   OXYGEN Inhale 2 L into the lungs continuous.   polyethylene glycol (MIRALAX / GLYCOLAX) 17 g packet Take 17 g by mouth daily.   potassium chloride SA (KLOR-CON) 20 MEQ tablet Take 1 tablet (20 mEq total) by mouth daily.   rivastigmine (EXELON) 4.6 mg/24hr Place 1 patch (4.6 mg total) onto the skin daily.   sildenafil (REVATIO) 20 MG tablet Take 2 tablets (40 mg total) by mouth 3 (three) times daily.   spironolactone (ALDACTONE) 25 MG  tablet Take 0.5 tablets (12.5 mg total) by mouth daily.   torsemide (DEMADEX) 20 MG tablet TAKE TWO TABLETS BY MOUTH DAILY   vitamin B-12 (CYANOCOBALAMIN) 500 MCG tablet Take 500 mcg by mouth daily.   No facility-administered encounter medications on file as of 06/07/2020.      RN Care Plan     "We need home health and in-home care services" (pt-stated)        CARE PLAN ENTRY (see longitudinal plan of care for additional care plan information)  Current Barriers:   Care Coordination  needs related to [place] scheduling in a patient with CHF, DM, CKD (disease states)  Transportation barriers  Nurse Case Manager Clinical Goal(s):   Over the next 7 days, patient/family will work with [place] regarding in-home care options  Interventions:   Inter-disciplinary care team collaboration (see longitudinal plan of care)  Chart reviewed including recent office notes, hospital discharge summary, and ED notes  Reviewed [facility]'s roster for [facility] patients. Patient was listed as engaged.   Reached out to [employer] with [employer] by secure email  Verified with Gillian Scarce that an NP had been out to assess [name] since hospital discharge  Discussed her need for in-home care and requested that they provide assistance to patient/family o Assured that information was passed on to [place] [facility] and Education officer, museum  Talked with daughter by telephone o Previously addressed in-home care needs o Home health is in place o Discussed visit by [facility]'s NP o Advised daughter that I have been in contact with them regarding in-home care and that she should receive a follow-up call   Encouraged to reach out to [facility] as needed and if she does not receive a follow-up call from [facility]  Patient Self Care Activities:   Self administers medications as prescribed  Attends all scheduled provider appointments  Unable to independently drive to appointments  Unable to perform IADLs independently  Please see past updates related to this goal by clicking on the "Past Updates" button in the selected goal          Plan:   The care management team will reach out to the patient again over the next 14 days.    Chong Sicilian, BSN, RN-BC Embedded Chronic Care Manager [facility] / [place] Management Direct Dial: [phone]

## 2020-06-11 NOTE — Patient Instructions (Signed)
Visit Information  Goals Addressed              This Visit's Progress     Patient Stated     "We need home health and in-home care services" (pt-stated)        CARE PLAN ENTRY (see longitudinal plan of care for additional care plan information)  Current Barriers:   Care Coordination needs related to [place] scheduling in a patient with CHF, DM, CKD (disease states)  Transportation barriers  Nurse Case Manager Clinical Goal(s):   Over the next 7 days, patient/family will work with [place] regarding in-home care options  Interventions:   Inter-disciplinary care team collaboration (see longitudinal plan of care)  Chart reviewed including recent office notes, hospital discharge summary, and ED notes  Reviewed [facility]'s roster for [facility] patients. Patient was listed as engaged.   Reached out to [employer] with [employer] by secure email  Verified with Gillian Scarce that an NP had been out to assess Ms [place] since hospital discharge  Discussed her need for in-home care and requested that they provide assistance to patient/family o Assured that information was passed on to [place] and Education officer, museum  Talked with daughter by telephone o Previously addressed in-home care needs o Home health is in place o Discussed visit by [facility]'s NP o Advised daughter that I have been in contact with them regarding in-home care and that she should receive a follow-up call   Encouraged to reach out to [facility] as needed and if she does not receive a follow-up call from [facility]  Patient Self Care Activities:   Self administers medications as prescribed  Attends all scheduled provider appointments  Unable to independently drive to appointments  Unable to perform IADLs independently  Please see past updates related to this goal by clicking on the "Past Updates" button in the selected goal         Patient verbalizes understanding of  instructions provided today.   Follow-up Plan The care management team will reach out to the patient again over the next 14 days.   Chong Sicilian, BSN, RN-BC Embedded Chronic Care Manager [facility] / [place] Management Direct Dial: [phone]

## 2020-06-11 NOTE — Telephone Encounter (Signed)
Received a fax requesting medical records from [facility]. Records were successfully faxed to: [phone] ,which was the number provided.. Medical request form will be scanned into patients chart.  ? ?

## 2020-06-12 ENCOUNTER — Other Ambulatory Visit: Payer: Self-pay

## 2020-06-12 ENCOUNTER — Ambulatory Visit (INDEPENDENT_AMBULATORY_CARE_PROVIDER_SITE_OTHER): Payer: Medicare HMO

## 2020-06-12 DIAGNOSIS — N183 Chronic kidney disease, stage 3 unspecified: Secondary | ICD-10-CM | POA: Diagnosis not present

## 2020-06-12 DIAGNOSIS — Z6841 Body Mass Index (BMI) 40.0 and over, adult: Secondary | ICD-10-CM

## 2020-06-12 DIAGNOSIS — I2781 Cor pulmonale (chronic): Secondary | ICD-10-CM

## 2020-06-12 DIAGNOSIS — M1990 Unspecified osteoarthritis, unspecified site: Secondary | ICD-10-CM

## 2020-06-12 DIAGNOSIS — I130 Hypertensive heart and chronic kidney disease with heart failure and stage 1 through stage 4 chronic kidney disease, or unspecified chronic kidney disease: Secondary | ICD-10-CM | POA: Diagnosis not present

## 2020-06-12 DIAGNOSIS — J189 Pneumonia, unspecified organism: Secondary | ICD-10-CM | POA: Diagnosis not present

## 2020-06-12 DIAGNOSIS — Z9981 Dependence on supplemental oxygen: Secondary | ICD-10-CM

## 2020-06-12 DIAGNOSIS — I270 Primary pulmonary hypertension: Secondary | ICD-10-CM

## 2020-06-12 DIAGNOSIS — E785 Hyperlipidemia, unspecified: Secondary | ICD-10-CM

## 2020-06-12 DIAGNOSIS — E1122 Type 2 diabetes mellitus with diabetic chronic kidney disease: Secondary | ICD-10-CM

## 2020-06-12 DIAGNOSIS — J9621 Acute and chronic respiratory failure with hypoxia: Secondary | ICD-10-CM | POA: Diagnosis not present

## 2020-06-12 DIAGNOSIS — I5033 Acute on chronic diastolic (congestive) heart failure: Secondary | ICD-10-CM | POA: Diagnosis not present

## 2020-06-12 DIAGNOSIS — F419 Anxiety disorder, unspecified: Secondary | ICD-10-CM

## 2020-06-12 DIAGNOSIS — I4891 Unspecified atrial fibrillation: Secondary | ICD-10-CM

## 2020-06-12 DIAGNOSIS — T83511D Infection and inflammatory reaction due to indwelling urethral catheter, subsequent encounter: Secondary | ICD-10-CM | POA: Diagnosis not present

## 2020-06-12 DIAGNOSIS — J440 Chronic obstructive pulmonary disease with acute lower respiratory infection: Secondary | ICD-10-CM | POA: Diagnosis not present

## 2020-06-12 DIAGNOSIS — Z8673 Personal history of transient ischemic attack (TIA), and cerebral infarction without residual deficits: Secondary | ICD-10-CM

## 2020-06-12 DIAGNOSIS — G4733 Obstructive sleep apnea (adult) (pediatric): Secondary | ICD-10-CM

## 2020-06-12 DIAGNOSIS — Z7901 Long term (current) use of anticoagulants: Secondary | ICD-10-CM

## 2020-06-12 DIAGNOSIS — N390 Urinary tract infection, site not specified: Secondary | ICD-10-CM

## 2020-06-12 DIAGNOSIS — B965 Pseudomonas (aeruginosa) (mallei) (pseudomallei) as the cause of diseases classified elsewhere: Secondary | ICD-10-CM

## 2020-06-12 DIAGNOSIS — Z7401 Bed confinement status: Secondary | ICD-10-CM

## 2020-06-12 DIAGNOSIS — D631 Anemia in chronic kidney disease: Secondary | ICD-10-CM | POA: Diagnosis not present

## 2020-06-12 DIAGNOSIS — K219 Gastro-esophageal reflux disease without esophagitis: Secondary | ICD-10-CM

## 2020-06-12 DIAGNOSIS — I890 Lymphedema, not elsewhere classified: Secondary | ICD-10-CM

## 2020-06-12 DIAGNOSIS — Z79899 Other long term (current) drug therapy: Secondary | ICD-10-CM

## 2020-06-12 DIAGNOSIS — Z9181 History of falling: Secondary | ICD-10-CM

## 2020-06-12 DIAGNOSIS — E039 Hypothyroidism, unspecified: Secondary | ICD-10-CM

## 2020-06-12 DIAGNOSIS — G8929 Other chronic pain: Secondary | ICD-10-CM

## 2020-06-12 DIAGNOSIS — I2510 Atherosclerotic heart disease of native coronary artery without angina pectoris: Secondary | ICD-10-CM

## 2020-06-12 DIAGNOSIS — M810 Age-related osteoporosis without current pathological fracture: Secondary | ICD-10-CM

## 2020-06-14 ENCOUNTER — Telehealth (HOSPITAL_COMMUNITY): Payer: Self-pay

## 2020-06-14 ENCOUNTER — Other Ambulatory Visit: Payer: Self-pay | Admitting: Family Medicine

## 2020-06-14 NOTE — Telephone Encounter (Signed)
Patients pharmacy called to confirm that patient was still taking spironolactone and midodrine. I advised them that those medications were still on the patients list to take.

## 2020-06-15 ENCOUNTER — Telehealth: Payer: Self-pay | Admitting: *Deleted

## 2020-06-15 NOTE — Telephone Encounter (Signed)
TC with Melissa @ [facility] The received a call from family wanting to be discharged from services. Melissa wanted to call to let us know that they will be discharging the patient, but also that they had made a call to APS during their visit that the patient is not safe at home, that she has roaches crawling around her, that she did not have any broken skin areas when she started with them but now has a stage 1 area on her bottom.

## 2020-06-15 NOTE — Telephone Encounter (Signed)
Pt is wanting to change [place] services [facility] they have not been happy with the service they have received, will need new order for PT and add nursing aide

## 2020-06-17 DIAGNOSIS — R2689 Other abnormalities of gait and mobility: Secondary | ICD-10-CM | POA: Diagnosis not present

## 2020-06-17 DIAGNOSIS — M6281 Muscle weakness (generalized): Secondary | ICD-10-CM | POA: Diagnosis not present

## 2020-06-20 DIAGNOSIS — D61818 Other pancytopenia: Secondary | ICD-10-CM | POA: Diagnosis not present

## 2020-06-21 ENCOUNTER — Ambulatory Visit: Payer: Medicare HMO | Admitting: Family Medicine

## 2020-06-22 ENCOUNTER — Ambulatory Visit: Payer: Medicare HMO | Admitting: *Deleted

## 2020-06-22 DIAGNOSIS — E1140 Type 2 diabetes mellitus with diabetic neuropathy, unspecified: Secondary | ICD-10-CM

## 2020-06-22 DIAGNOSIS — I5033 Acute on chronic diastolic (congestive) heart failure: Secondary | ICD-10-CM

## 2020-06-22 DIAGNOSIS — I5022 Chronic systolic (congestive) heart failure: Secondary | ICD-10-CM | POA: Diagnosis not present

## 2020-06-22 DIAGNOSIS — Z748 Other problems related to care provider dependency: Secondary | ICD-10-CM

## 2020-06-22 NOTE — Patient Instructions (Signed)
Visit Information  Goals Addressed              This Visit's Progress     Patient Stated   .  "We need home health and in-home care services" (pt-stated)        [place] (see longitudinal plan of care for additional care plan information)  Current Barriers:  . Care Coordination needs related to [place] scheduling in a patient with CHF, DM, CKD (disease states) . Transportation barriers  Nurse Case Manager Clinical Goal(s):  Marland Kitchen Over the next 7 days, patient/daughter will talk with RN Care Manager regarding in home health care  Interventions:  . Inter-disciplinary care team collaboration (see longitudinal plan of care) . Chart reviewed including recent office notes, hospital discharge summary, and ED notes . Previously discussed her need for in-home care and requested that [facility] provide assistance to patient/family o Assured that information was passed on to [place] and Social Worker o Patient/daughter has not received a f/u call regarding this  . Talked with patient's daughter [name] regarding home health o They requested to be discharged from [place] and are requesting services from [facility] . Reviewed upcoming appt with Dr Livia Snellen on 06/25/20 for DME order. Will need to have new HH order placed at that visit.  Marland Kitchen Discussed transportation concerns o Daughter planned to call EMS for transport to appt on Monday o Urgent order for transportation assistance sent to Care Management Care Guides for assistance . RNCM will reach out to [place] to f/u  . Collaborated with Care Guide team . Collaborated with [facility] clinical staff regarding the possibility of a video visit vs in person visit for DME orders . Discussed video visit with patient's daughter and decided that is the best option at this time . Collaborated with [facility] Clinical staff to change appt to a video visit . Advised that a link will be sent to her phone prior to the appointment  and that she will use that to login . Transportation referral cancelled  Patient Self Care Activities:  . Self administers medications as prescribed . Attends all scheduled provider appointments . Unable to independently drive to appointments . Unable to perform IADLs independently  Please see past updates related to this goal by clicking on the "Past Updates" button in the selected goal         Patient verbalizes understanding of instructions provided today.   Follow-up Plan The care management team will reach out to the patient again over the next 7 days.   Chong Sicilian, BSN, RN-BC Embedded Chronic Care Manager [facility] / [place] Management Direct Dial: [phone]

## 2020-06-22 NOTE — Chronic Care Management (AMB) (Signed)
Chronic Care Management   Follow Up Note   06/22/2020 Name: [name] MRN: [id number] DOB: 08-Sep-1946  Referred by: Wendy Fraise, MD Reason for referral : Chronic Care Management (RN follow up)   [name] is a 74 y.o. year old female who is a primary care patient of Stacks, Cletus Gash, MD. The CCM team was consulted for assistance with chronic disease management and care coordination needs.    Review of patient status, including review of consultants reports, relevant laboratory and other test results, and collaboration with appropriate care team members and the patient's provider was performed as part of comprehensive patient evaluation and provision of chronic care management services.    SDOH (Social Determinants of Health) assessments performed: Yes-transportation See Care Plan activities for detailed interventions related to [facility])     Outpatient Encounter Medications as of 06/22/2020  Medication Sig   Accu-Chek FastClix Lancets MISC Check BS 2 times daily Dx E11.9   ACCU-CHEK GUIDE test strip Please specify directions, refills and quantity (Patient taking differently: 1 each by Other route as needed. )   acetaminophen (TYLENOL) 500 MG tablet Take 500 mg by mouth every 4 (four) hours as needed for mild pain.    albuterol (PROVENTIL) (2.5 MG/3ML) 0.083% nebulizer solution INHALE THE CONTENTS OF 1 VIAL by NEBULIZER EVERY 6 HOURS AS NEEDED FOR WHEEZING OR SHORTNESS OF BREATH   albuterol (VENTOLIN HFA) 108 (90 Base) MCG/ACT inhaler INHALE 2 PUFFS 4 TIMES A DAY AS DIRECTED   apixaban (ELIQUIS) 5 MG TABS tablet Take 1 tablet (5 mg total) by mouth 2 (two) times daily.   ciprofloxacin (CIPRO) 500 MG tablet Take 500 mg by mouth 2 (two) times daily.   Dulaglutide (TRULICITY) 5.10 CH/8.5ID SOPN INJECT 0.5ML SUBCUTANEOUSLY ONCE WEEKLY ON SATURDAY   ferrous sulfate (FEROSUL) 325 (65 FE) MG tablet Take 1 tablet (325 mg total) by mouth daily with breakfast.   fish  oil-omega-3 fatty acids 1000 MG capsule Take 2 g by mouth daily.     gabapentin (NEURONTIN) 300 MG capsule Take 2 capsules (602 mg total) by mouth at bedtime.   glucose blood (ACCU-CHEK AVIVA PLUS) test strip Check BS 2 times daily Dx E11.9   insulin detemir (LEVEMIR FLEXTOUCH) 100 UNIT/ML FlexPen INJECT 40 UNITS SUBCUTANEOUSLY TWICE DAILY   Insulin Pen Needle (GLOBAL EASE INJECT PEN NEEDLES) 32G X 4 MM MISC NEW PRESCRIPTION REQUEST: PEN NEEDLES 4MMX32G - USE AS DIRECTED WITH INSULIN TWICE DAILY   ipratropium-albuterol (DUONEB) 0.5-2.5 (3) MG/3ML SOLN INHALE THE CONTENTS OF 1 VIAL INTO THE LUNGS VIA NEBULIZER EVERY 6 HOURS AS NEEDED FOR SHORTNESS OF BREATH   levothyroxine (SYNTHROID) 50 MCG tablet LEVOTHYROXINE 50MCG - TAKE ONE TABLET BY MOUTH DAILY   macitentan (OPSUMIT) 10 MG tablet Take 1 tablet (10 mg total) by mouth daily.   metFORMIN (GLUCOPHAGE-XR) 750 MG 24 hr tablet Take 1 tablet by mouth daily.   midodrine (PROAMATINE) 10 MG tablet Take 1 tablet (10 mg total) by mouth 3 (three) times daily with meals.   ondansetron (ZOFRAN-ODT) 8 MG disintegrating tablet Take 1 tablet (8 mg total) by mouth every 6 (six) hours as needed for nausea or vomiting.   OXYGEN Inhale 2 L into the lungs continuous.   polyethylene glycol (MIRALAX / GLYCOLAX) 17 g packet Take 17 g by mouth daily.   potassium chloride SA (KLOR-CON) 20 MEQ tablet Take 1 tablet (20 mEq total) by mouth daily.   rivastigmine (EXELON) 4.6 mg/24hr PLACE 1 PATCH (4.6 MG TOTAL) ONTO  THE SKIN DAILY.   sildenafil (REVATIO) 20 MG tablet Take 2 tablets (40 mg total) by mouth 3 (three) times daily.   spironolactone (ALDACTONE) 25 MG tablet Take 0.5 tablets (12.5 mg total) by mouth daily.   torsemide (DEMADEX) 20 MG tablet TAKE TWO TABLETS BY MOUTH DAILY   vitamin B-12 (CYANOCOBALAMIN) 500 MCG tablet Take 500 mcg by mouth daily.   No facility-administered encounter medications on file as of 06/22/2020.      RN Care Plan              This Visit's Progress     Patient Stated     "We need home health and in-home care services" (pt-stated)        CARE PLAN ENTRY (see longitudinal plan of care for additional care plan information)  Current Barriers:   Care Coordination needs related to [name] scheduling in a patient with CHF, DM, CKD (disease states)  Transportation barriers  Nurse Case Manager Clinical Goal(s):   Over the next 7 days, patient/daughter will talk with RN Care Manager regarding in home health care  Interventions:   Inter-disciplinary care team collaboration (see longitudinal plan of care)  Chart reviewed including recent office notes, hospital discharge summary, and ED notes  Previously discussed her need for in-home care and requested that [facility] provide assistance to patient/family o Assured that information was passed on to [place] and Social Worker o Patient/daughter has not received a f/u call regarding this   Talked with patient's daughter [name] regarding home health o They requested to be discharged from [facility] and are requesting services from [facility] upcoming appt with Dr [name] on 06/25/20 for DME order. Will need to have new HH order placed at that visit.   Discussed transportation concerns o Daughter planned to call EMS for transport to appt on Monday o Urgent order for transportation assistance sent to Care Management Care Guides for assistance  RNCM will reach out to [place] to f/u   Collaborated with [place] team  Collaborated with [facility] clinical staff regarding the possibility of a video visit vs in person visit for DME orders  Discussed video visit with patient's daughter and decided that is the best option at this time  Collaborated with [facility] Clinical staff to change appt to a video visit  Advised that a link will be sent to her phone prior to the appointment and that she will use that to  login  Transportation referral cancelled  Patient Self Care Activities:   Self administers medications as prescribed  Attends all scheduled provider appointments  Unable to independently drive to appointments  Unable to perform IADLs independently  Please see past updates related to this goal by clicking on the "Past Updates" button in the selected goal          Plan:   The care management team will reach out to the patient again over the next 7 days.    Chong Sicilian, BSN, RN-BC Embedded Chronic Care Manager [facility] / [place] Management Direct Dial: [phone]

## 2020-06-23 DIAGNOSIS — I5033 Acute on chronic diastolic (congestive) heart failure: Secondary | ICD-10-CM | POA: Diagnosis not present

## 2020-06-23 DIAGNOSIS — M6281 Muscle weakness (generalized): Secondary | ICD-10-CM | POA: Diagnosis not present

## 2020-06-23 DIAGNOSIS — J9621 Acute and chronic respiratory failure with hypoxia: Secondary | ICD-10-CM | POA: Diagnosis not present

## 2020-06-25 ENCOUNTER — Encounter: Payer: Self-pay | Admitting: Family Medicine

## 2020-06-25 ENCOUNTER — Telehealth (INDEPENDENT_AMBULATORY_CARE_PROVIDER_SITE_OTHER): Payer: Medicare HMO | Admitting: Family Medicine

## 2020-06-25 DIAGNOSIS — I5022 Chronic systolic (congestive) heart failure: Secondary | ICD-10-CM | POA: Diagnosis not present

## 2020-06-25 DIAGNOSIS — E039 Hypothyroidism, unspecified: Secondary | ICD-10-CM

## 2020-06-25 DIAGNOSIS — R5381 Other malaise: Secondary | ICD-10-CM

## 2020-06-25 DIAGNOSIS — E1121 Type 2 diabetes mellitus with diabetic nephropathy: Secondary | ICD-10-CM | POA: Diagnosis not present

## 2020-06-25 MED ORDER — QUETIAPINE FUMARATE 25 MG PO TABS: 25.0000 mg | ORAL_TABLET | Freq: Every day | ORAL | 2 refills | Status: DC

## 2020-06-25 MED ORDER — CATHETER RED RUBBER COATED MISC: 5 refills | Status: DC

## 2020-06-25 NOTE — Progress Notes (Signed)
Subjective:  Patient ID: [name], female    DOB: [birth year]  Age: 74 y.o. MRN: [id number]  CC: No chief complaint on file.   HPI [name] presents for need for home health.  She cannot walk.  She needs physical therapy.  She is not taking her medications regularly and is very repetitive in her conversation through the day.  This is under treatment for Alzheimer's but the dementia seems to be getting worse in spite of this.  This indicates need for skilled nursing and intervention.  She also is in need of a nerve medication.  She will not except any type of instruction from her family so there is no improvement from the standpoint of strength exercise and ability to take care of herself.  She has become incontinent of urine and her daughter wants to use pure wick urinary catheters with her.  She needs a prescription for that today.  Depression screen [facility] 2/9 03/22/2020 03/22/2020 06/30/2019  Decreased Interest 2 2 0  Down, Depressed, Hopeless 1 1 0  PHQ - 2 Score 3 3 0  Altered sleeping 3 - -  Tired, decreased energy 3 - -  Change in appetite 1 - -  Feeling bad or failure about yourself  0 - -  Trouble concentrating 1 - -  Moving slowly or fidgety/restless 0 - -  Suicidal thoughts 0 - -  PHQ-9 Score 11 - -  Difficult doing work/chores Somewhat difficult - -  Some recent data might be hidden    History Amari has a past medical history of Anemia, Arthritis, Asthma, Atrial fibrillation ([place]), CAD (coronary artery disease), Cataract, CHF (congestive heart failure) ([place]), COPD (chronic obstructive pulmonary disease) ([place]), Diabetes mellitus, Gait instability, GERD (gastroesophageal reflux disease), Hyperlipidemia, Hypertension, Obesity, Oxygen dependent, Sleep apnea, obstructive (2008), and Stroke ([place]).   She has a past surgical history that includes Cataract extraction w/ intraocular lens  implant, bilateral; Appendectomy; Cholecystectomy; Carpal tunnel release; Cervical  spine surgery; Cesarean section; Umbilical hernia repair; Knee arthroscopy; Colonoscopy (11/22/2002); Upper gastrointestinal endoscopy (11/22/2002); Colonoscopy (11/26/2005); Femur IM nail (Left, 05/08/2013); Hip surgery (Left); Eye surgery; and RIGHT HEART CATH (N/A, 10/13/2019).   Her family history includes Alzheimer's disease in her father and mother; Asthma in her daughter and grandchild; Breast cancer in her sister; Cancer in her brother, mother, sister, and sister; Colon cancer in her mother; Diabetes in her daughter, mother, sister, and another family member; Heart attack in her mother, sister, and another family member; Heart disease in her brother, mother, and sister; Heart murmur in her brother; Kidney disease in her mother; Migraines in her sister.She reports that she has never smoked. She has never used smokeless tobacco. She reports that she does not drink alcohol and does not use drugs.    ROS Review of Systems  Constitutional: Negative.   HENT: Negative.   Eyes: Negative for visual disturbance.  Respiratory: Positive for shortness of breath (chronic O2 in use ).   Cardiovascular: Negative for chest pain.  Gastrointestinal: Negative for abdominal pain.  Musculoskeletal: Positive for arthralgias.  Neurological: Positive for weakness.  Psychiatric/Behavioral: Positive for confusion.    Objective:  There were no vitals taken for this visit.  BP Readings from Last 3 Encounters:  05/31/20 129/64  05/18/20 (!) 124/58  03/22/20 (!) 172/84    Wt Readings from Last 3 Encounters:  05/30/20 279 lb 15.8 oz (127 kg)  05/18/20 280 lb (127 kg)  03/22/20 274 lb 2 oz (124.3 kg)  Physical Exam  Exam deferred. Pt. [place] due to COVID 19.  Face-to-face video visit performed.   Assessment & Plan:   Diagnoses and all orders for this visit:  Chronic systolic congestive heart failure ([place]) -     Ambulatory referral to Home Health  Diabetic nephropathy associated with type 2  diabetes mellitus ([place]) -     Ambulatory referral to [place]  Hypothyroidism, unspecified type -     Ambulatory referral to [place] -     Ambulatory referral to [place]  Other orders -     QUEtiapine (SEROQUEL) 25 MG tablet; Take 1 tablet (25 mg total) by mouth at bedtime. -     Catheters (CATHETER RED RUBBER COATED) MISC; Pure Elza Rafter brand is preferred. Use every 4 hours as needed for urinary incontinence.       I am having Pansey L. Habermann start on QUEtiapine and Catheter Red Rubber Coated. I am also having her maintain her fish oil-omega-3 fatty acids, acetaminophen, OXYGEN, macitentan, sildenafil, potassium chloride SA, midodrine, Accu-Chek Aviva Plus, Accu-Chek FastClix Lancets, Accu-Chek Guide, apixaban, levothyroxine, spironolactone, Global Ease Inject Pen Needles, torsemide, albuterol, gabapentin, Levemir FlexTouch, Trulicity, ferrous sulfate, ondansetron, ciprofloxacin, vitamin B-12, metFORMIN, polyethylene glycol, albuterol, ipratropium-albuterol, and rivastigmine.  Allergies as of 06/25/2020      Reactions   Lisinopril    R/t to kidney   Codeine Nausea And Vomiting      Medication List       Accurate as of June 25, 2020  2:37 PM. If you have any questions, ask your nurse or doctor.        Accu-Chek Aviva Plus test strip Generic drug: glucose blood Check BS 2 times daily Dx E11.9 What changed: Another medication with the same name was changed. Make sure you understand how and when to take each.   Accu-Chek Guide test strip Generic drug: glucose blood Please specify directions, refills and quantity What changed: See the new instructions.   Accu-Chek FastClix Lancets Misc Check BS 2 times daily Dx E11.9   acetaminophen 500 MG tablet Commonly known as: TYLENOL Take 500 mg by mouth every 4 (four) hours as needed for mild pain.   albuterol 108 (90 Base) MCG/ACT inhaler Commonly known as: VENTOLIN HFA INHALE 2 PUFFS 4 TIMES A DAY AS  DIRECTED   albuterol (2.5 MG/3ML) 0.083% nebulizer solution Commonly known as: PROVENTIL INHALE THE CONTENTS OF 1 VIAL by NEBULIZER EVERY 6 HOURS AS NEEDED FOR WHEEZING OR SHORTNESS OF BREATH   apixaban 5 MG Tabs tablet Commonly known as: ELIQUIS Take 1 tablet (5 mg total) by mouth 2 (two) times daily.   Catheter Red Rubber Coated Misc Pure Elza Rafter brand is preferred. Use every 4 hours as needed for urinary incontinence. Started by: Claretta Fraise, MD   ciprofloxacin 500 MG tablet Commonly known as: CIPRO Take 500 mg by mouth 2 (two) times daily.   ferrous sulfate 325 (65 FE) MG tablet Commonly known as: FeroSul Take 1 tablet (325 mg total) by mouth daily with breakfast.   fish oil-omega-3 fatty acids 1000 MG capsule Take 2 g by mouth daily.   gabapentin 300 MG capsule Commonly known as: NEURONTIN Take 2 capsules (602 mg total) by mouth at bedtime.   Global Ease Inject Pen Needles 32G X 4 MM Misc Generic drug: Insulin Pen Needle NEW PRESCRIPTION REQUEST: PEN NEEDLES 4MMX32G - USE AS DIRECTED WITH INSULIN TWICE DAILY   ipratropium-albuterol 0.5-2.5 (3) MG/3ML Soln Commonly known as: DUONEB INHALE  THE CONTENTS OF 1 VIAL INTO THE LUNGS VIA NEBULIZER EVERY 6 HOURS AS NEEDED FOR SHORTNESS OF BREATH   Levemir FlexTouch 100 UNIT/ML FlexPen Generic drug: insulin detemir INJECT 40 UNITS SUBCUTANEOUSLY TWICE DAILY   levothyroxine 50 MCG tablet Commonly known as: SYNTHROID LEVOTHYROXINE 50MCG - TAKE ONE TABLET BY MOUTH DAILY   macitentan 10 MG tablet Commonly known as: OPSUMIT Take 1 tablet (10 mg total) by mouth daily.   metFORMIN 750 MG 24 hr tablet Commonly known as: GLUCOPHAGE-XR Take 1 tablet by mouth daily.   midodrine 10 MG tablet Commonly known as: PROAMATINE Take 1 tablet (10 mg total) by mouth 3 (three) times daily with meals.   ondansetron 8 MG disintegrating tablet Commonly known as: ZOFRAN-ODT Take 1 tablet (8 mg total) by mouth every 6 (six) hours as needed  for nausea or vomiting.   OXYGEN Inhale 2 L into the lungs continuous.   polyethylene glycol 17 g packet Commonly known as: MIRALAX / GLYCOLAX Take 17 g by mouth daily.   potassium chloride SA 20 MEQ tablet Commonly known as: KLOR-CON Take 1 tablet (20 mEq total) by mouth daily.   QUEtiapine 25 MG tablet Commonly known as: SEROQUEL Take 1 tablet (25 mg total) by mouth at bedtime. Started by: Claretta Fraise, MD   rivastigmine 4.6 mg/24hr Commonly known as: EXELON PLACE 1 PATCH (4.6 MG TOTAL) ONTO THE SKIN DAILY.   sildenafil 20 MG tablet Commonly known as: REVATIO Take 2 tablets (40 mg total) by mouth 3 (three) times daily.   spironolactone 25 MG tablet Commonly known as: ALDACTONE Take 0.5 tablets (12.5 mg total) by mouth daily.   torsemide 20 MG tablet Commonly known as: DEMADEX TAKE TWO TABLETS BY MOUTH DAILY   Trulicity 3.15 VV/6.1YW Sopn Generic drug: Dulaglutide INJECT 0.5ML SUBCUTANEOUSLY ONCE WEEKLY ON SATURDAY   vitamin B-12 500 MCG tablet Commonly known as: CYANOCOBALAMIN Take 500 mcg by mouth daily.      Virtual Visit via video note  I discussed the limitations, risks, security and privacy concerns of performing an evaluation and management service by video and the availability of in person appointments. I also discussed with the patient that there may be a patient responsible charge related to this service. The patient expressed understanding and agreed to proceed. Pt. Is at home with her daughter. Dr. Livia Snellen is in his office.  Follow Up Instructions:   I discussed the assessment and treatment plan with the patient. The patient was provided an opportunity to ask questions and all were answered. The patient agreed with the plan and demonstrated an understanding of the instructions.   The patient was advised to call back or seek an in-person evaluation if the symptoms worsen or if the condition fails to improve as anticipated.  Total minutes including  chart review and phone contact time: 27  Case discussed with chronic care management.  Will help with obtaining services.  Follow-up: Return in about 1 month (around 07/26/2020).  Claretta Fraise, M.D.

## 2020-06-27 ENCOUNTER — Telehealth: Payer: Self-pay | Admitting: Family Medicine

## 2020-06-27 NOTE — Telephone Encounter (Signed)
I submitted the referral. Chong Sicilian is working with them as well.

## 2020-06-27 NOTE — Telephone Encounter (Signed)
[name] from [place] calling to inquire about   *Home Health Aid *Physical Therapy *Skilled Nursing  [facility] was coming out to do physical therapy and family was not happy with them as they were rude and judgmental towards them.  [facility] is taking new patients and they would like to be set up with them please.  Patient now has 3 pressure ulcers to her sacrum.  Needs this to be addressed soon.  Please call and let them know how to proceed.

## 2020-06-28 DIAGNOSIS — L89152 Pressure ulcer of sacral region, stage 2: Secondary | ICD-10-CM | POA: Diagnosis not present

## 2020-06-28 NOTE — Telephone Encounter (Signed)
Let [name] know that I have sent [facility] referral to these 2 agencies put was not able to place her with them. Sending to Jonne Ply w/ [facility]

## 2020-06-28 NOTE — Telephone Encounter (Signed)
A community message was sent to [facility] with [facility], they were not able to take her. Message then sent to [facility] who was also not able to take her. Left message for [name] with [facility] to call back

## 2020-06-29 ENCOUNTER — Telehealth: Payer: Self-pay | Admitting: *Deleted

## 2020-06-29 ENCOUNTER — Ambulatory Visit: Payer: Medicare HMO | Admitting: *Deleted

## 2020-06-29 DIAGNOSIS — R5381 Other malaise: Secondary | ICD-10-CM

## 2020-06-29 DIAGNOSIS — I5022 Chronic systolic (congestive) heart failure: Secondary | ICD-10-CM

## 2020-06-29 DIAGNOSIS — E1140 Type 2 diabetes mellitus with diabetic neuropathy, unspecified: Secondary | ICD-10-CM

## 2020-06-29 MED ORDER — CATHETER RED RUBBER COATED MISC: 5 refills | Status: AC

## 2020-06-29 NOTE — Telephone Encounter (Signed)
06/29/2020  Order for Pure Wick Catheters [facility] at visit on [facility]/25/20 and should have been sent to [facility] per patient's daughter. She is requesting that this order be sent to [facility].   Forwarding to [facility] Clinical staff for review and action.   Chong Sicilian, BSN, RN-BC Embedded Chronic Care Manager [facility] / [place] Management Direct Dial: [phone]

## 2020-06-29 NOTE — Telephone Encounter (Signed)
Daughter aware and verbalizes understanding per dpr.

## 2020-06-29 NOTE — Chronic Care Management (AMB) (Signed)
  Chronic Care Management   Note  06/29/2020 Name: [name] MRN: [id number] DOB: [birth year]  Patient is enrolled with Case Management through [facility]. So as not to duplicate services, I have removed Ms Carcione form the embedded CCM program. I have contacted her Case Manager, Ishmael Holter, and asked that she contact me at [phone] if they stop providing case management services so that I can discuss Re-enrollment in the embedded CCM program with patient/daughter.   Per daughter, [name], Pure Wick Catheters were to be sent to [facility]. Per E[facility], [facility]. Telephone message sent to [facility] clinical staff requesting that they send order to [facility].  Discussed referral to home health agency and patient's frustration that Ms Scriven has not been accepted into another [facility] agency for care since they discontinued services with [facility]. Referral to [facility] is pending.    Plan: Patient/daughter made aware and encouraged to reach out to embedded CCM team if services are needed in the future. Daughter advised to f/u with [facility] with any questions or concerns about her treatment or orders  Daughter advised to reach out to [facility] with [facility] [place] with any Case Management needs  Chong Sicilian, BSN, RN-BC [place] / [place] Management Direct Dial: [phone]

## 2020-07-03 NOTE — Telephone Encounter (Signed)
TC to [facility], [facility] was not able to take pt Sent community message to Adela Lank w/ [facility], he accepted patient Informed Ishmael Holter this was my last option on [facility] agencies

## 2020-07-05 ENCOUNTER — Other Ambulatory Visit: Payer: Self-pay | Admitting: Internal Medicine

## 2020-07-05 ENCOUNTER — Other Ambulatory Visit: Payer: Self-pay | Admitting: Family Medicine

## 2020-07-05 DIAGNOSIS — I5022 Chronic systolic (congestive) heart failure: Secondary | ICD-10-CM

## 2020-07-05 DIAGNOSIS — J431 Panlobular emphysema: Secondary | ICD-10-CM

## 2020-07-06 ENCOUNTER — Other Ambulatory Visit: Payer: Self-pay | Admitting: *Deleted

## 2020-07-06 DIAGNOSIS — E1121 Type 2 diabetes mellitus with diabetic nephropathy: Secondary | ICD-10-CM

## 2020-07-06 MED ORDER — LEVEMIR FLEXTOUCH 100 UNIT/ML ~~LOC~~ SOPN: PEN_INJECTOR | SUBCUTANEOUS | 0 refills | Status: DC

## 2020-07-09 DIAGNOSIS — L89152 Pressure ulcer of sacral region, stage 2: Secondary | ICD-10-CM | POA: Diagnosis not present

## 2020-07-18 DIAGNOSIS — M6281 Muscle weakness (generalized): Secondary | ICD-10-CM | POA: Diagnosis not present

## 2020-07-18 DIAGNOSIS — R2689 Other abnormalities of gait and mobility: Secondary | ICD-10-CM | POA: Diagnosis not present

## 2020-07-24 DIAGNOSIS — J9621 Acute and chronic respiratory failure with hypoxia: Secondary | ICD-10-CM | POA: Diagnosis not present

## 2020-07-24 DIAGNOSIS — I5033 Acute on chronic diastolic (congestive) heart failure: Secondary | ICD-10-CM | POA: Diagnosis not present

## 2020-07-24 DIAGNOSIS — M6281 Muscle weakness (generalized): Secondary | ICD-10-CM | POA: Diagnosis not present

## 2020-07-26 ENCOUNTER — Ambulatory Visit: Payer: Medicare HMO | Admitting: Family Medicine

## 2020-08-02 IMAGING — MR MRI HEAD WITHOUT CONTRAST
8 of 10 series · 35 of 48 positions shown · non-contrast
Comparison: CT head 06/22/2019

CLINICAL DATA: Neuro deficit. Dizziness blurred vision facial droop

EXAM:
MRI HEAD WITHOUT CONTRAST
TECHNIQUE: Multiplanar, multiecho pulse sequences of the brain and surrounding
structures were obtained without intravenous contrast.

[Series 3: T1 · sagittal · 5.0mm · 0.42mm/px · 3 of 20 slices shown (1 of 2)]
[im 1/20]
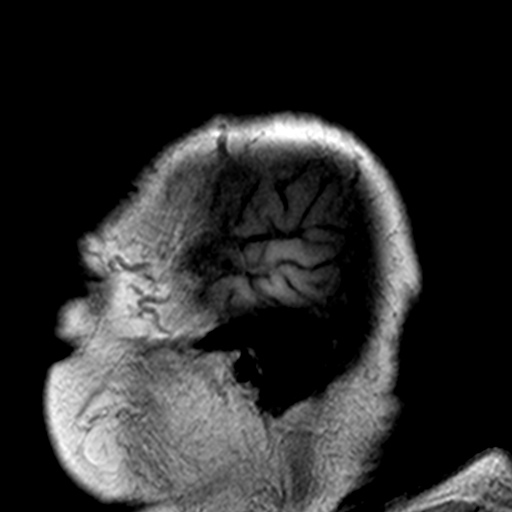
[im 10/20]
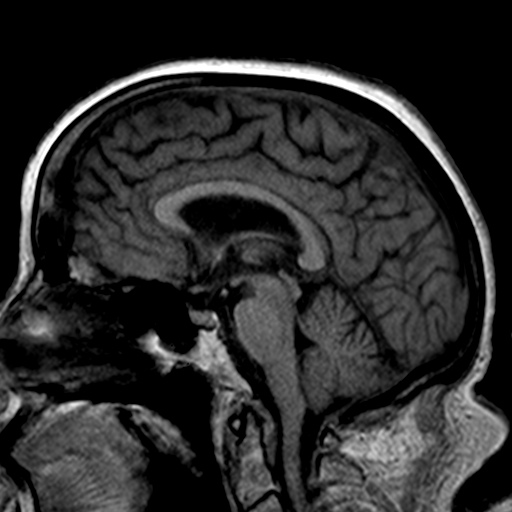
[im 20/20]
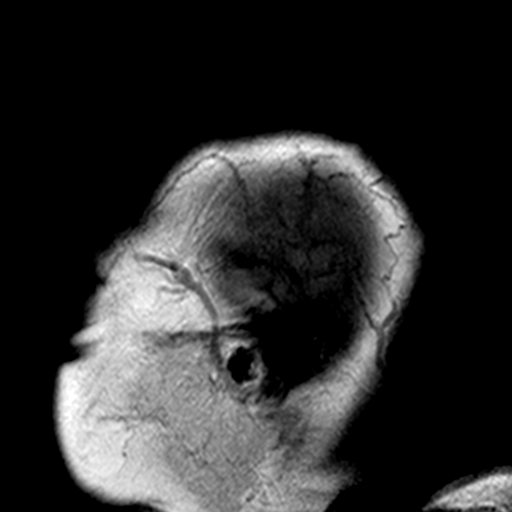

[Series 4: DWI · axial · 3.0mm · 0.75mm/px · z∈[-87,+47]mm · 7 of 55 slices shown (1 of 2)]
[im 1/55]
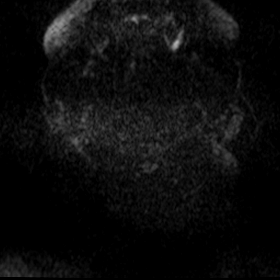
[im 10/55]
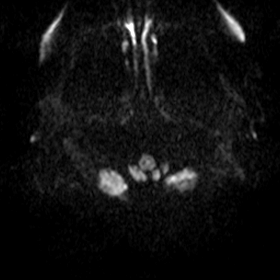
[im 19/55]
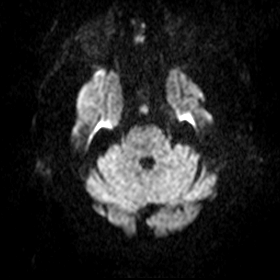
[im 28/55]
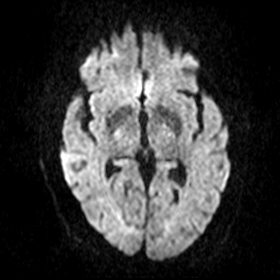
[im 37/55]
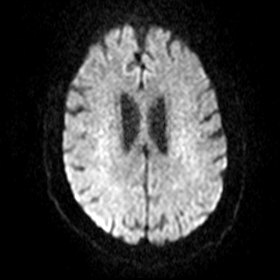
[im 46/55]
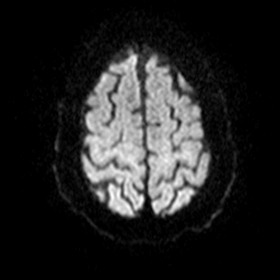
[im 55/55]
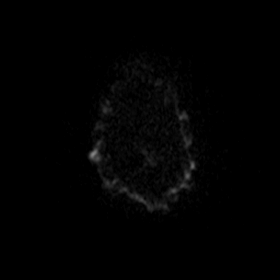

[Series 6: DWI · coronal · 5.0mm · 0.49mm/px · 4 of 34 slices shown (2 of 2)]
[im 1/34]
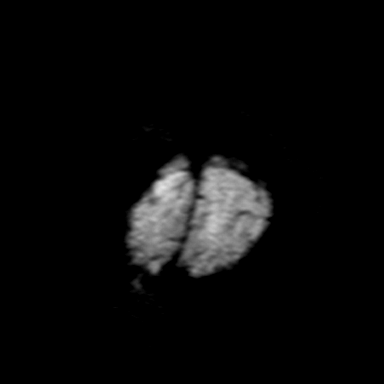
[im 12/34]
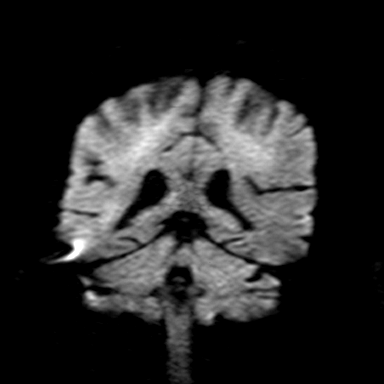
[im 23/34]
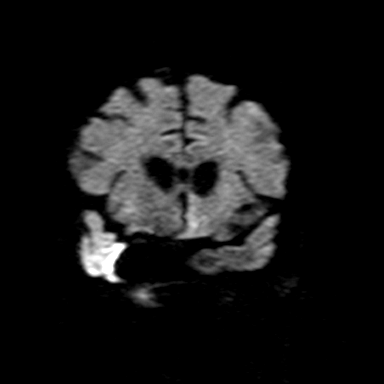
[im 34/34]
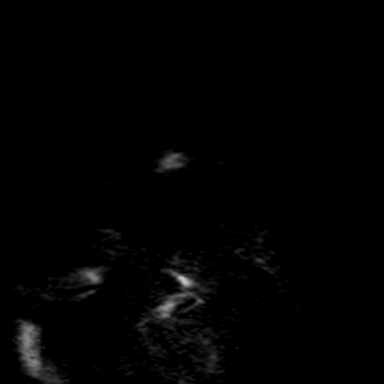

[Series 8: T2 · axial · 5.0mm · 0.68mm/px · z∈[-73,+45]mm · 3 of 23 slices shown (1 of 3)]
[im 1/23]
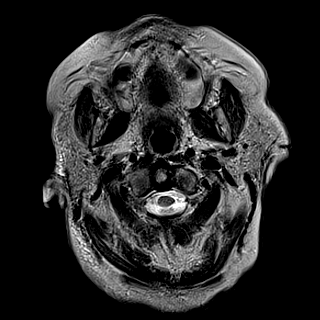
[im 12/23]
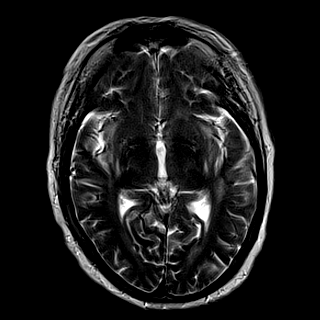
[im 23/23]
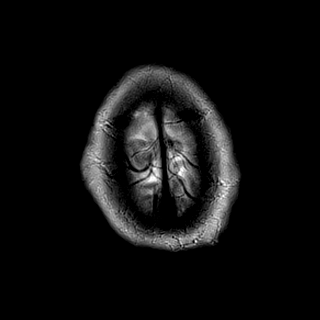

[Series 9: T2 · axial · 5.0mm · 0.42mm/px · z∈[-69,+38]mm · 2 of 21 slices shown (2 of 3)]
[im 1/21]
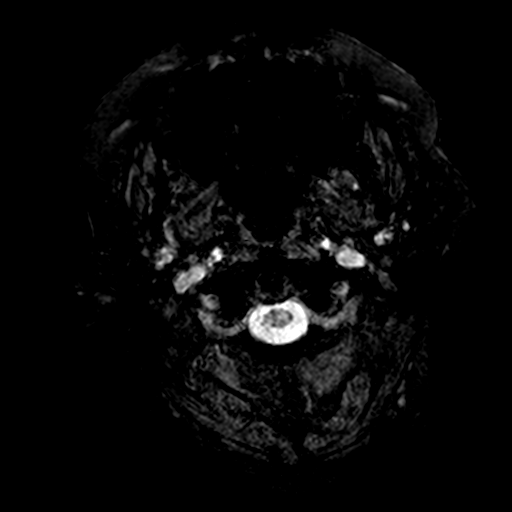
[im 21/21]
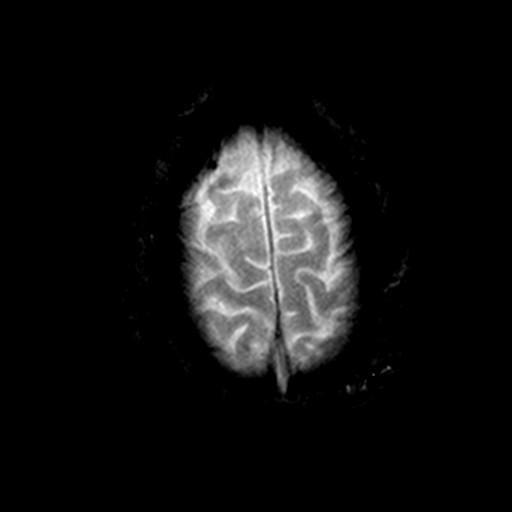

[Series 10: FLAIR · axial · 3.0mm · 0.87mm/px · z∈[-70,+44]mm · 5 of 47 slices shown]
[im 1/47]
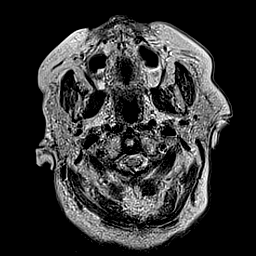
[im 12/47]
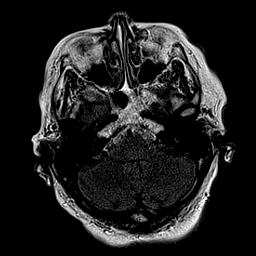
[im 24/47]
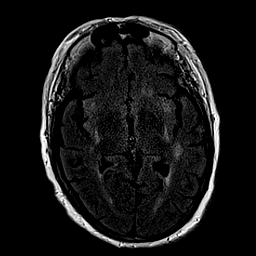
[im 35/47]
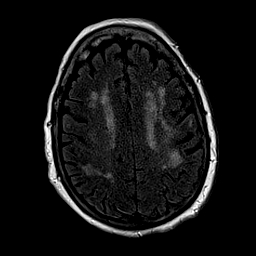
[im 47/47]
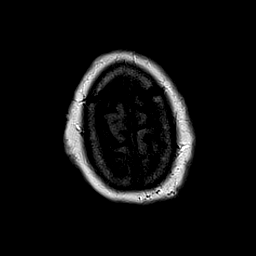

[Series 11: T1 · axial · 2.0mm · 0.42mm/px · z∈[-85,+68]mm · 8 of 94 slices shown (2 of 2)]
[im 1/94]
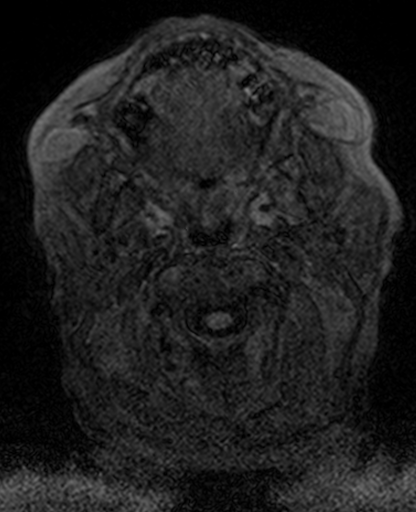
[im 19/94]
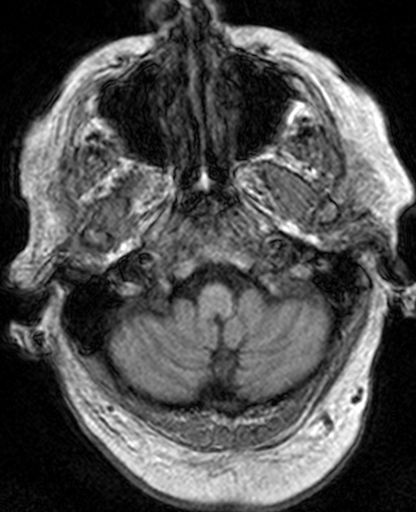
[im 28/94]
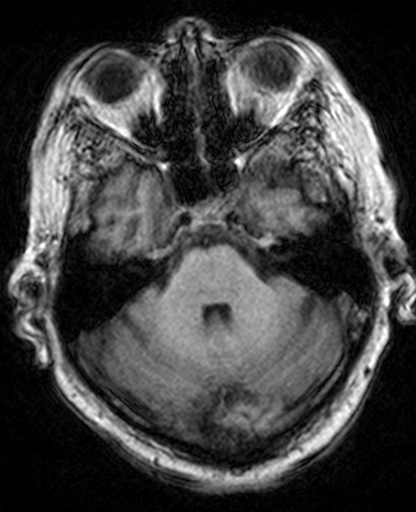
[im 38/94]
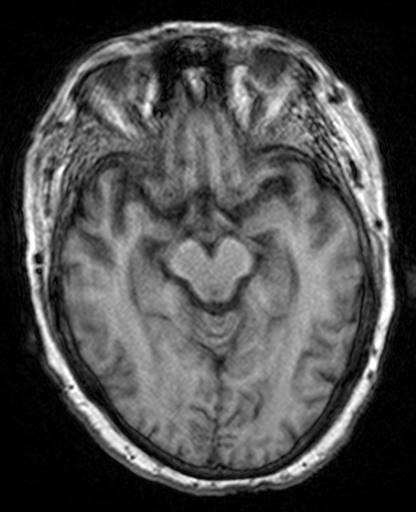
[im 56/94]
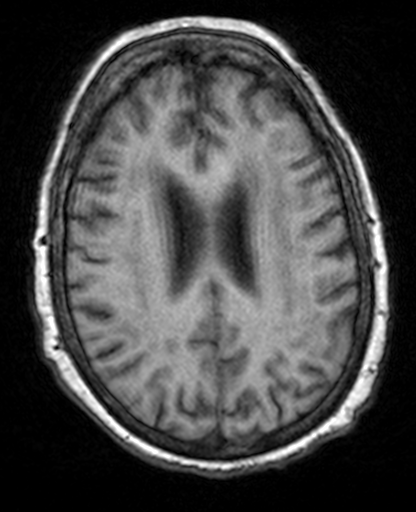
[im 66/94]
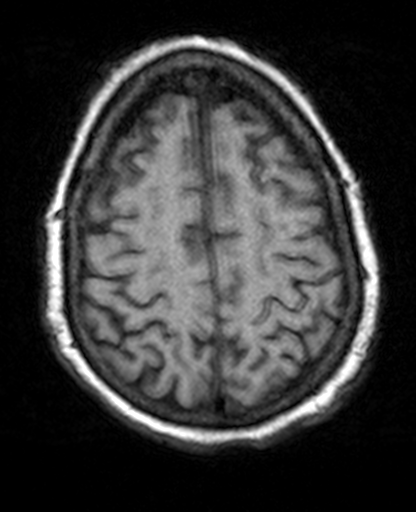
[im 75/94]
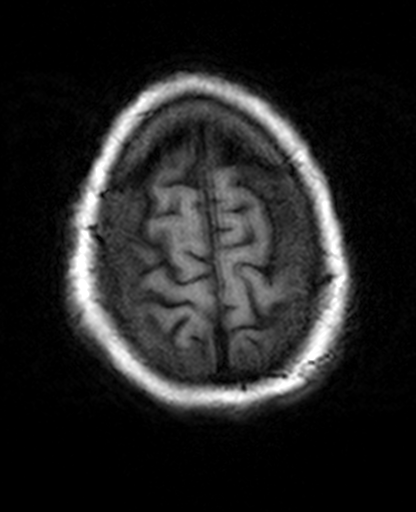
[im 94/94]
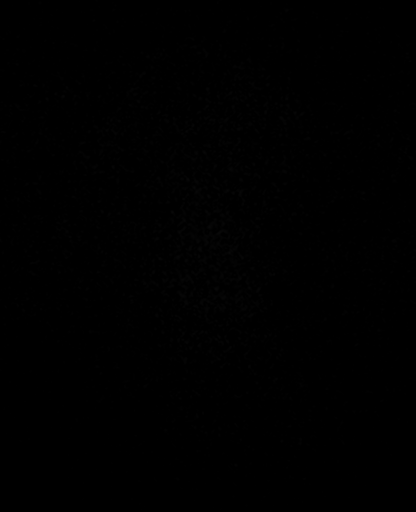

[Series 12: T2 · coronal · 5.0mm · 0.59mm/px · 3 of 28 slices shown (3 of 3)]
[im 1/28]
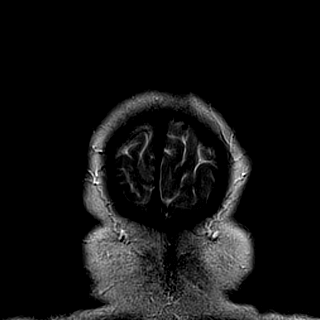
[im 14/28]
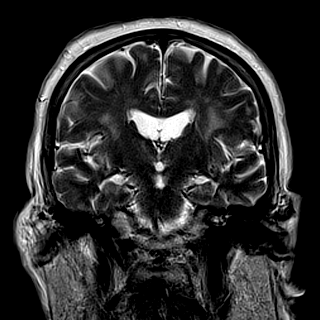
[im 28/28]
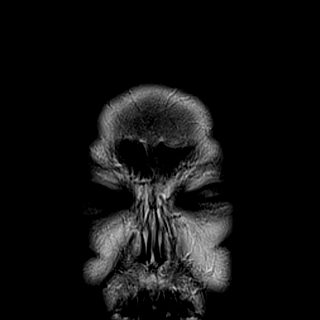

[35 of 48 positions shown; findings below may reference images not displayed]

FINDINGS: Brain: Negative for acute infarct. Moderate chronic microvascular
ischemic changes in the white matter. Ventricle size normal.
Negative for hemorrhage mass or fluid collection. No midline shift.

Vascular: Normal arterial flow void

Skull and upper cervical spine: Negative

Sinuses/Orbits: Paranasal sinuses clear.  Bilateral cataract surgery

Other: None
IMPRESSION: Negative for acute infarct. Moderate chronic microvascular ischemic
changes in the white matter.

## 2020-08-07 ENCOUNTER — Other Ambulatory Visit: Payer: Self-pay | Admitting: Adult Health

## 2020-08-07 ENCOUNTER — Other Ambulatory Visit: Payer: Self-pay | Admitting: Family Medicine

## 2020-08-07 DIAGNOSIS — I482 Chronic atrial fibrillation, unspecified: Secondary | ICD-10-CM

## 2020-08-07 DIAGNOSIS — Z7901 Long term (current) use of anticoagulants: Secondary | ICD-10-CM

## 2020-08-07 DIAGNOSIS — E1121 Type 2 diabetes mellitus with diabetic nephropathy: Secondary | ICD-10-CM

## 2020-08-07 DIAGNOSIS — J431 Panlobular emphysema: Secondary | ICD-10-CM

## 2020-08-17 ENCOUNTER — Other Ambulatory Visit: Payer: Self-pay | Admitting: Family Medicine

## 2020-08-17 DIAGNOSIS — E039 Hypothyroidism, unspecified: Secondary | ICD-10-CM

## 2020-08-18 DIAGNOSIS — M6281 Muscle weakness (generalized): Secondary | ICD-10-CM | POA: Diagnosis not present

## 2020-08-18 DIAGNOSIS — R2689 Other abnormalities of gait and mobility: Secondary | ICD-10-CM | POA: Diagnosis not present

## 2020-08-24 DIAGNOSIS — J9621 Acute and chronic respiratory failure with hypoxia: Secondary | ICD-10-CM | POA: Diagnosis not present

## 2020-08-24 DIAGNOSIS — I5033 Acute on chronic diastolic (congestive) heart failure: Secondary | ICD-10-CM | POA: Diagnosis not present

## 2020-08-24 DIAGNOSIS — M6281 Muscle weakness (generalized): Secondary | ICD-10-CM | POA: Diagnosis not present

## 2020-08-29 NOTE — Telephone Encounter (Signed)
This encounter was created in error - please disregard.

## 2020-09-05 ENCOUNTER — Other Ambulatory Visit: Payer: Self-pay | Admitting: Family Medicine

## 2020-09-05 DIAGNOSIS — J431 Panlobular emphysema: Secondary | ICD-10-CM

## 2020-09-06 ENCOUNTER — Other Ambulatory Visit: Payer: Self-pay | Admitting: *Deleted

## 2020-09-06 DIAGNOSIS — E1121 Type 2 diabetes mellitus with diabetic nephropathy: Secondary | ICD-10-CM

## 2020-09-06 MED ORDER — LEVEMIR FLEXTOUCH 100 UNIT/ML ~~LOC~~ SOPN: PEN_INJECTOR | SUBCUTANEOUS | 0 refills | Status: DC

## 2020-09-17 DIAGNOSIS — M6281 Muscle weakness (generalized): Secondary | ICD-10-CM | POA: Diagnosis not present

## 2020-09-17 DIAGNOSIS — R2689 Other abnormalities of gait and mobility: Secondary | ICD-10-CM | POA: Diagnosis not present

## 2020-09-23 DIAGNOSIS — I5033 Acute on chronic diastolic (congestive) heart failure: Secondary | ICD-10-CM | POA: Diagnosis not present

## 2020-09-23 DIAGNOSIS — J9621 Acute and chronic respiratory failure with hypoxia: Secondary | ICD-10-CM | POA: Diagnosis not present

## 2020-09-23 DIAGNOSIS — M6281 Muscle weakness (generalized): Secondary | ICD-10-CM | POA: Diagnosis not present

## 2020-10-01 ENCOUNTER — Other Ambulatory Visit: Payer: Self-pay | Admitting: Family Medicine

## 2020-10-01 ENCOUNTER — Other Ambulatory Visit: Payer: Self-pay | Admitting: Internal Medicine

## 2020-10-01 DIAGNOSIS — E039 Hypothyroidism, unspecified: Secondary | ICD-10-CM

## 2020-10-01 DIAGNOSIS — I5022 Chronic systolic (congestive) heart failure: Secondary | ICD-10-CM

## 2020-10-01 DIAGNOSIS — J431 Panlobular emphysema: Secondary | ICD-10-CM

## 2020-10-12 ENCOUNTER — Telehealth: Payer: Self-pay

## 2020-10-12 NOTE — Telephone Encounter (Signed)
Daughter called says that oxygen tank is messed up--abnormal pressure. She is oxygen dependent. Right now she is using portable one and not sure how long that one will last. Please call back

## 2020-10-16 ENCOUNTER — Other Ambulatory Visit: Payer: Self-pay | Admitting: Adult Health

## 2020-10-16 ENCOUNTER — Other Ambulatory Visit: Payer: Self-pay | Admitting: Internal Medicine

## 2020-10-16 ENCOUNTER — Other Ambulatory Visit: Payer: Self-pay | Admitting: Family Medicine

## 2020-10-16 DIAGNOSIS — I5022 Chronic systolic (congestive) heart failure: Secondary | ICD-10-CM

## 2020-10-16 DIAGNOSIS — J431 Panlobular emphysema: Secondary | ICD-10-CM

## 2020-10-16 NOTE — Telephone Encounter (Signed)
Left message to call back  

## 2020-10-18 DIAGNOSIS — R2689 Other abnormalities of gait and mobility: Secondary | ICD-10-CM | POA: Diagnosis not present

## 2020-10-18 DIAGNOSIS — M6281 Muscle weakness (generalized): Secondary | ICD-10-CM | POA: Diagnosis not present

## 2020-10-19 NOTE — Telephone Encounter (Signed)
TC to [facility] they will get her a tank out and tech will check on

## 2020-10-23 ENCOUNTER — Telehealth: Payer: Self-pay

## 2020-10-23 NOTE — Telephone Encounter (Signed)
Patient aware she will need to be seen.  Patient states that she is bed ridden and can not come in.  [facility] video visit made

## 2020-10-24 ENCOUNTER — Other Ambulatory Visit: Payer: Self-pay | Admitting: Adult Health

## 2020-10-24 ENCOUNTER — Other Ambulatory Visit: Payer: Self-pay | Admitting: Family Medicine

## 2020-10-24 ENCOUNTER — Other Ambulatory Visit: Payer: Self-pay | Admitting: Internal Medicine

## 2020-10-24 DIAGNOSIS — I5022 Chronic systolic (congestive) heart failure: Secondary | ICD-10-CM

## 2020-10-24 DIAGNOSIS — E039 Hypothyroidism, unspecified: Secondary | ICD-10-CM

## 2020-10-24 DIAGNOSIS — J431 Panlobular emphysema: Secondary | ICD-10-CM

## 2020-10-24 DIAGNOSIS — J9621 Acute and chronic respiratory failure with hypoxia: Secondary | ICD-10-CM | POA: Diagnosis not present

## 2020-10-24 DIAGNOSIS — E1121 Type 2 diabetes mellitus with diabetic nephropathy: Secondary | ICD-10-CM

## 2020-10-24 DIAGNOSIS — M6281 Muscle weakness (generalized): Secondary | ICD-10-CM | POA: Diagnosis not present

## 2020-10-24 DIAGNOSIS — I5033 Acute on chronic diastolic (congestive) heart failure: Secondary | ICD-10-CM | POA: Diagnosis not present

## 2020-11-05 DIAGNOSIS — I4891 Unspecified atrial fibrillation: Secondary | ICD-10-CM | POA: Diagnosis not present

## 2020-11-05 DIAGNOSIS — R064 Hyperventilation: Secondary | ICD-10-CM | POA: Diagnosis not present

## 2020-11-05 DIAGNOSIS — G459 Transient cerebral ischemic attack, unspecified: Secondary | ICD-10-CM | POA: Diagnosis not present

## 2020-11-05 DIAGNOSIS — I130 Hypertensive heart and chronic kidney disease with heart failure and stage 1 through stage 4 chronic kidney disease, or unspecified chronic kidney disease: Secondary | ICD-10-CM | POA: Diagnosis not present

## 2020-11-05 DIAGNOSIS — R109 Unspecified abdominal pain: Secondary | ICD-10-CM | POA: Diagnosis not present

## 2020-11-05 DIAGNOSIS — R0902 Hypoxemia: Secondary | ICD-10-CM | POA: Diagnosis not present

## 2020-11-05 DIAGNOSIS — J189 Pneumonia, unspecified organism: Secondary | ICD-10-CM | POA: Diagnosis not present

## 2020-11-05 DIAGNOSIS — I509 Heart failure, unspecified: Secondary | ICD-10-CM | POA: Diagnosis not present

## 2020-11-05 DIAGNOSIS — W19XXXA Unspecified fall, initial encounter: Secondary | ICD-10-CM | POA: Diagnosis not present

## 2020-11-05 DIAGNOSIS — Z885 Allergy status to narcotic agent status: Secondary | ICD-10-CM | POA: Diagnosis not present

## 2020-11-05 DIAGNOSIS — J90 Pleural effusion, not elsewhere classified: Secondary | ICD-10-CM | POA: Diagnosis not present

## 2020-11-05 DIAGNOSIS — N183 Chronic kidney disease, stage 3 unspecified: Secondary | ICD-10-CM | POA: Diagnosis not present

## 2020-11-05 DIAGNOSIS — J449 Chronic obstructive pulmonary disease, unspecified: Secondary | ICD-10-CM | POA: Diagnosis not present

## 2020-11-05 DIAGNOSIS — R918 Other nonspecific abnormal finding of lung field: Secondary | ICD-10-CM | POA: Diagnosis not present

## 2020-11-05 DIAGNOSIS — Z20822 Contact with and (suspected) exposure to covid-19: Secondary | ICD-10-CM | POA: Diagnosis not present

## 2020-11-05 DIAGNOSIS — E1122 Type 2 diabetes mellitus with diabetic chronic kidney disease: Secondary | ICD-10-CM | POA: Diagnosis not present

## 2020-11-05 DIAGNOSIS — R42 Dizziness and giddiness: Secondary | ICD-10-CM | POA: Diagnosis not present

## 2020-11-06 ENCOUNTER — Telehealth: Payer: Medicare HMO | Admitting: Family

## 2020-11-06 DIAGNOSIS — R58 Hemorrhage, not elsewhere classified: Secondary | ICD-10-CM | POA: Diagnosis not present

## 2020-11-09 ENCOUNTER — Telehealth: Payer: Self-pay

## 2020-11-09 NOTE — Telephone Encounter (Signed)
Appointment scheduled.

## 2020-11-09 NOTE — Telephone Encounter (Signed)
lmtcb

## 2020-11-12 ENCOUNTER — Ambulatory Visit: Payer: Medicare HMO | Admitting: Family Medicine

## 2020-11-12 ENCOUNTER — Other Ambulatory Visit: Payer: Self-pay | Admitting: Adult Health

## 2020-11-12 DIAGNOSIS — I5022 Chronic systolic (congestive) heart failure: Secondary | ICD-10-CM

## 2020-11-13 ENCOUNTER — Other Ambulatory Visit: Payer: Self-pay | Admitting: Family Medicine

## 2020-11-13 DIAGNOSIS — E1121 Type 2 diabetes mellitus with diabetic nephropathy: Secondary | ICD-10-CM

## 2020-11-13 DIAGNOSIS — J431 Panlobular emphysema: Secondary | ICD-10-CM

## 2020-11-15 ENCOUNTER — Telehealth: Payer: Self-pay

## 2020-11-15 NOTE — Telephone Encounter (Signed)
Received a call from daughter about patient needing medication change for her dementia.  Appointment scheduled.  Also asked about HH.  I see a referral in there from 07/03/20 and states [facility] accepted pt but daughter states never got a call. Please look into this.

## 2020-11-17 DIAGNOSIS — R2689 Other abnormalities of gait and mobility: Secondary | ICD-10-CM | POA: Diagnosis not present

## 2020-11-17 DIAGNOSIS — M6281 Muscle weakness (generalized): Secondary | ICD-10-CM | POA: Diagnosis not present

## 2020-11-19 NOTE — Telephone Encounter (Signed)
They will need a face to face visit for a new referral. (In person preferred. Video if necessary.)

## 2020-11-19 NOTE — Telephone Encounter (Signed)
lmtcb

## 2020-11-22 ENCOUNTER — Telehealth: Payer: Self-pay

## 2020-11-22 NOTE — Telephone Encounter (Signed)
Pt has appt scheduled 11/26/20 with Dr Livia Snellen.

## 2020-11-23 DIAGNOSIS — J9621 Acute and chronic respiratory failure with hypoxia: Secondary | ICD-10-CM | POA: Diagnosis not present

## 2020-11-23 DIAGNOSIS — M6281 Muscle weakness (generalized): Secondary | ICD-10-CM | POA: Diagnosis not present

## 2020-11-23 DIAGNOSIS — I5033 Acute on chronic diastolic (congestive) heart failure: Secondary | ICD-10-CM | POA: Diagnosis not present

## 2020-11-26 ENCOUNTER — Ambulatory Visit (INDEPENDENT_AMBULATORY_CARE_PROVIDER_SITE_OTHER): Payer: Medicare HMO | Admitting: Family Medicine

## 2020-11-26 DIAGNOSIS — R5381 Other malaise: Secondary | ICD-10-CM | POA: Diagnosis not present

## 2020-11-26 DIAGNOSIS — E1121 Type 2 diabetes mellitus with diabetic nephropathy: Secondary | ICD-10-CM | POA: Diagnosis not present

## 2020-11-26 DIAGNOSIS — R0602 Shortness of breath: Secondary | ICD-10-CM | POA: Diagnosis not present

## 2020-11-26 MED ORDER — POLYETHYLENE GLYCOL 3350 17 G PO PACK: 17.0000 g | PACK | Freq: Two times a day (BID) | ORAL | 0 refills | Status: AC

## 2020-11-26 MED ORDER — TRULICITY 1.5 MG/0.5ML ~~LOC~~ SOAJ: SUBCUTANEOUS | 2 refills | Status: DC

## 2020-11-26 MED ORDER — RIVASTIGMINE 4.6 MG/24HR TD PT24: 4.6000 mg | MEDICATED_PATCH | Freq: Every day | TRANSDERMAL | 1 refills | Status: DC

## 2020-11-26 NOTE — Progress Notes (Signed)
Subjective:    Patient ID: [name], female    DOB: 19-Dec-1945, 74 y.o.   MRN: [id number]   HPI: [name] is a 74 y.o. female presenting for inability to walk for 5 months. Needs PT. Also needs aide. Was supposed to have this 5 months ago. Home O2 concentrator picked up due to malfunctioned. Now relying on a portable. Having to decrease to 2 liters. Requirement is usually 3-4 liters. Working with [employer]. They refuse to bring out a new concentrator. Requests [facility]. [facility] has declined to provide services. Was able to walk until admitted for pneumonia to [facility] in July. Since DC can not walk.   Glucose running 200-250 fasting and PP per daughter. Didn't take metformin at all. Makes her sick. Nausea.    Depression screen [facility] 2/9 03/22/2020 03/22/2020 06/30/2019 01/03/2019 09/08/2018  Decreased Interest 2 2 0 0 0  Down, Depressed, Hopeless 1 1 0 0 0  PHQ - 2 Score 3 3 0 0 0  Altered sleeping 3 - - - -  Tired, decreased energy 3 - - - -  Change in appetite 1 - - - -  Feeling bad or failure about yourself  0 - - - -  Trouble concentrating 1 - - - -  Moving slowly or fidgety/restless 0 - - - -  Suicidal thoughts 0 - - - -  PHQ-9 Score 11 - - - -  Difficult doing work/chores Somewhat difficult - - - -  Some recent data might be hidden     Relevant past medical, surgical, family and social history reviewed and updated as indicated.  Interim medical history since our last visit reviewed. Allergies and medications reviewed and updated.  ROS:  Review of Systems  Constitutional: Negative.   HENT: Negative.   Eyes: Negative for visual disturbance.  Respiratory: Negative for shortness of breath.   Cardiovascular: Negative for chest pain.  Gastrointestinal: Negative for abdominal pain.  Musculoskeletal: Negative for arthralgias.     Social History   Tobacco Use  Smoking Status Never Smoker  Smokeless Tobacco Never Used       Objective:     Wt  Readings from Last 3 Encounters:  05/30/20 279 lb 15.8 oz (127 kg)  05/18/20 280 lb (127 kg)  03/22/20 274 lb 2 oz (124.3 kg)     Exam deferred. Pt. Harboring due to COVID 19. Phone visit performed.   Assessment & Plan:   1. Shortness of breath   2. Diabetic nephropathy associated with type 2 diabetes mellitus ([place])   3. Physical debility     Meds ordered this encounter  Medications  . rivastigmine (EXELON) 4.6 mg/24hr    Sig: Place 1 patch (4.6 mg total) onto the skin daily.    Dispense:  90 patch    Refill:  1  . Dulaglutide (TRULICITY) 1.5 WU/9.8JX SOPN    Sig: Inject content of one pen under the skin weekly    Dispense:  2 mL    Refill:  2  . polyethylene glycol (MIRALAX / GLYCOLAX) 17 g packet    Sig: Take 17 g by mouth 2 (two) times daily.    Dispense:  14 each    Refill:  0    Orders Placed This Encounter  Procedures  . Ambulatory referral to Home Health    Referral Priority:   Routine    Referral Type:   Home Health Care    Referral Reason:   Specialty  Services Required    Requested Specialty:   [place]    Number of Visits Requested:   1      Diagnoses and all orders for this visit:  Shortness of breath -     Ambulatory referral to Home Health  Diabetic nephropathy associated with type 2 diabetes mellitus ([place]) -     Ambulatory referral to Home Health  Physical debility -     Ambulatory referral to [place]  Other orders -     rivastigmine (EXELON) 4.6 mg/24hr; Place 1 patch (4.6 mg total) onto the skin daily. -     Dulaglutide (TRULICITY) 1.5 YK/9.9IP SOPN; Inject content of one pen under the skin weekly -     polyethylene glycol (MIRALAX / GLYCOLAX) 17 g packet; Take 17 g by mouth 2 (two) times daily.    Virtual Visit via telephone Note  I discussed the limitations, risks, security and privacy concerns of performing an evaluation and management service by telephone and the availability of in person appointments. The patient was  identified with two identifiers. Pt.expressed understanding and agreed to proceed. Pt. Is at home. Dr. Livia Snellen is in his office.  Follow Up Instructions:   I discussed the assessment and treatment plan with the patient. The patient was provided an opportunity to ask questions and all were answered. The patient agreed with the plan and demonstrated an understanding of the instructions.   The patient was advised to call back or seek an in-person evaluation if the symptoms worsen or if the condition fails to improve as anticipated.   Total minutes including chart review and phone contact time: 37   Follow up plan: Return in about 6 weeks (around 01/07/2021).  Claretta Fraise, MD [facility]

## 2020-11-28 ENCOUNTER — Encounter: Payer: Self-pay | Admitting: Family Medicine

## 2020-12-12 ENCOUNTER — Other Ambulatory Visit: Payer: Self-pay | Admitting: Adult Health

## 2020-12-12 ENCOUNTER — Other Ambulatory Visit: Payer: Self-pay | Admitting: Family Medicine

## 2020-12-12 DIAGNOSIS — I5022 Chronic systolic (congestive) heart failure: Secondary | ICD-10-CM

## 2020-12-12 DIAGNOSIS — J431 Panlobular emphysema: Secondary | ICD-10-CM

## 2020-12-17 ENCOUNTER — Other Ambulatory Visit: Payer: Self-pay | Admitting: Family Medicine

## 2020-12-17 NOTE — Addendum Note (Signed)
Addended by: Claretta Fraise on: 12/17/2020 11:43 AM   Modules accepted: Orders

## 2020-12-18 DIAGNOSIS — M6281 Muscle weakness (generalized): Secondary | ICD-10-CM | POA: Diagnosis not present

## 2020-12-18 DIAGNOSIS — R2689 Other abnormalities of gait and mobility: Secondary | ICD-10-CM | POA: Diagnosis not present

## 2020-12-24 DIAGNOSIS — M6281 Muscle weakness (generalized): Secondary | ICD-10-CM | POA: Diagnosis not present

## 2020-12-24 DIAGNOSIS — I5033 Acute on chronic diastolic (congestive) heart failure: Secondary | ICD-10-CM | POA: Diagnosis not present

## 2020-12-24 DIAGNOSIS — J9621 Acute and chronic respiratory failure with hypoxia: Secondary | ICD-10-CM | POA: Diagnosis not present

## 2020-12-28 DIAGNOSIS — L89152 Pressure ulcer of sacral region, stage 2: Secondary | ICD-10-CM | POA: Diagnosis not present

## 2020-12-28 DIAGNOSIS — N183 Chronic kidney disease, stage 3 unspecified: Secondary | ICD-10-CM | POA: Diagnosis not present

## 2020-12-28 DIAGNOSIS — I130 Hypertensive heart and chronic kidney disease with heart failure and stage 1 through stage 4 chronic kidney disease, or unspecified chronic kidney disease: Secondary | ICD-10-CM | POA: Diagnosis not present

## 2020-12-28 DIAGNOSIS — I2510 Atherosclerotic heart disease of native coronary artery without angina pectoris: Secondary | ICD-10-CM | POA: Diagnosis not present

## 2020-12-28 DIAGNOSIS — I270 Primary pulmonary hypertension: Secondary | ICD-10-CM | POA: Diagnosis not present

## 2020-12-28 DIAGNOSIS — J9621 Acute and chronic respiratory failure with hypoxia: Secondary | ICD-10-CM | POA: Diagnosis not present

## 2020-12-28 DIAGNOSIS — J449 Chronic obstructive pulmonary disease, unspecified: Secondary | ICD-10-CM | POA: Diagnosis not present

## 2020-12-28 DIAGNOSIS — I5033 Acute on chronic diastolic (congestive) heart failure: Secondary | ICD-10-CM | POA: Diagnosis not present

## 2020-12-28 DIAGNOSIS — E1122 Type 2 diabetes mellitus with diabetic chronic kidney disease: Secondary | ICD-10-CM | POA: Diagnosis not present

## 2020-12-31 DIAGNOSIS — E1122 Type 2 diabetes mellitus with diabetic chronic kidney disease: Secondary | ICD-10-CM | POA: Diagnosis not present

## 2020-12-31 DIAGNOSIS — I2510 Atherosclerotic heart disease of native coronary artery without angina pectoris: Secondary | ICD-10-CM | POA: Diagnosis not present

## 2020-12-31 DIAGNOSIS — J9621 Acute and chronic respiratory failure with hypoxia: Secondary | ICD-10-CM | POA: Diagnosis not present

## 2020-12-31 DIAGNOSIS — I5033 Acute on chronic diastolic (congestive) heart failure: Secondary | ICD-10-CM | POA: Diagnosis not present

## 2020-12-31 DIAGNOSIS — I130 Hypertensive heart and chronic kidney disease with heart failure and stage 1 through stage 4 chronic kidney disease, or unspecified chronic kidney disease: Secondary | ICD-10-CM | POA: Diagnosis not present

## 2020-12-31 DIAGNOSIS — L89152 Pressure ulcer of sacral region, stage 2: Secondary | ICD-10-CM | POA: Diagnosis not present

## 2020-12-31 DIAGNOSIS — I270 Primary pulmonary hypertension: Secondary | ICD-10-CM | POA: Diagnosis not present

## 2020-12-31 DIAGNOSIS — J449 Chronic obstructive pulmonary disease, unspecified: Secondary | ICD-10-CM | POA: Diagnosis not present

## 2020-12-31 DIAGNOSIS — N183 Chronic kidney disease, stage 3 unspecified: Secondary | ICD-10-CM | POA: Diagnosis not present

## 2021-01-02 DIAGNOSIS — J9621 Acute and chronic respiratory failure with hypoxia: Secondary | ICD-10-CM | POA: Diagnosis not present

## 2021-01-02 DIAGNOSIS — I130 Hypertensive heart and chronic kidney disease with heart failure and stage 1 through stage 4 chronic kidney disease, or unspecified chronic kidney disease: Secondary | ICD-10-CM | POA: Diagnosis not present

## 2021-01-02 DIAGNOSIS — I129 Hypertensive chronic kidney disease with stage 1 through stage 4 chronic kidney disease, or unspecified chronic kidney disease: Secondary | ICD-10-CM | POA: Diagnosis not present

## 2021-01-02 DIAGNOSIS — J9610 Chronic respiratory failure, unspecified whether with hypoxia or hypercapnia: Secondary | ICD-10-CM | POA: Diagnosis not present

## 2021-01-02 DIAGNOSIS — I739 Peripheral vascular disease, unspecified: Secondary | ICD-10-CM | POA: Diagnosis not present

## 2021-01-02 DIAGNOSIS — L89152 Pressure ulcer of sacral region, stage 2: Secondary | ICD-10-CM | POA: Diagnosis not present

## 2021-01-02 DIAGNOSIS — Z515 Encounter for palliative care: Secondary | ICD-10-CM | POA: Diagnosis not present

## 2021-01-02 DIAGNOSIS — I270 Primary pulmonary hypertension: Secondary | ICD-10-CM | POA: Diagnosis not present

## 2021-01-02 DIAGNOSIS — E1122 Type 2 diabetes mellitus with diabetic chronic kidney disease: Secondary | ICD-10-CM | POA: Diagnosis not present

## 2021-01-02 DIAGNOSIS — I2510 Atherosclerotic heart disease of native coronary artery without angina pectoris: Secondary | ICD-10-CM | POA: Diagnosis not present

## 2021-01-02 DIAGNOSIS — N183 Chronic kidney disease, stage 3 unspecified: Secondary | ICD-10-CM | POA: Diagnosis not present

## 2021-01-02 DIAGNOSIS — J449 Chronic obstructive pulmonary disease, unspecified: Secondary | ICD-10-CM | POA: Diagnosis not present

## 2021-01-02 DIAGNOSIS — G8220 Paraplegia, unspecified: Secondary | ICD-10-CM | POA: Diagnosis not present

## 2021-01-02 DIAGNOSIS — I25118 Atherosclerotic heart disease of native coronary artery with other forms of angina pectoris: Secondary | ICD-10-CM | POA: Diagnosis not present

## 2021-01-02 DIAGNOSIS — I5033 Acute on chronic diastolic (congestive) heart failure: Secondary | ICD-10-CM | POA: Diagnosis not present

## 2021-01-02 DIAGNOSIS — Z7401 Bed confinement status: Secondary | ICD-10-CM | POA: Diagnosis not present

## 2021-01-07 ENCOUNTER — Telehealth: Payer: Self-pay

## 2021-01-07 DIAGNOSIS — J449 Chronic obstructive pulmonary disease, unspecified: Secondary | ICD-10-CM | POA: Diagnosis not present

## 2021-01-07 DIAGNOSIS — J9621 Acute and chronic respiratory failure with hypoxia: Secondary | ICD-10-CM | POA: Diagnosis not present

## 2021-01-07 DIAGNOSIS — I130 Hypertensive heart and chronic kidney disease with heart failure and stage 1 through stage 4 chronic kidney disease, or unspecified chronic kidney disease: Secondary | ICD-10-CM | POA: Diagnosis not present

## 2021-01-07 DIAGNOSIS — I270 Primary pulmonary hypertension: Secondary | ICD-10-CM | POA: Diagnosis not present

## 2021-01-07 DIAGNOSIS — I5033 Acute on chronic diastolic (congestive) heart failure: Secondary | ICD-10-CM | POA: Diagnosis not present

## 2021-01-07 DIAGNOSIS — E1122 Type 2 diabetes mellitus with diabetic chronic kidney disease: Secondary | ICD-10-CM | POA: Diagnosis not present

## 2021-01-07 DIAGNOSIS — N183 Chronic kidney disease, stage 3 unspecified: Secondary | ICD-10-CM | POA: Diagnosis not present

## 2021-01-07 DIAGNOSIS — L89152 Pressure ulcer of sacral region, stage 2: Secondary | ICD-10-CM | POA: Diagnosis not present

## 2021-01-07 DIAGNOSIS — I2510 Atherosclerotic heart disease of native coronary artery without angina pectoris: Secondary | ICD-10-CM | POA: Diagnosis not present

## 2021-01-07 NOTE — Telephone Encounter (Signed)
Appt made for FL2 form to be filled out

## 2021-01-08 ENCOUNTER — Other Ambulatory Visit: Payer: Self-pay | Admitting: Adult Health

## 2021-01-08 ENCOUNTER — Other Ambulatory Visit: Payer: Self-pay | Admitting: Family Medicine

## 2021-01-08 ENCOUNTER — Ambulatory Visit (INDEPENDENT_AMBULATORY_CARE_PROVIDER_SITE_OTHER): Payer: Medicare HMO

## 2021-01-08 ENCOUNTER — Other Ambulatory Visit: Payer: Self-pay | Admitting: Internal Medicine

## 2021-01-08 ENCOUNTER — Other Ambulatory Visit: Payer: Self-pay

## 2021-01-08 DIAGNOSIS — E1122 Type 2 diabetes mellitus with diabetic chronic kidney disease: Secondary | ICD-10-CM | POA: Diagnosis not present

## 2021-01-08 DIAGNOSIS — I270 Primary pulmonary hypertension: Secondary | ICD-10-CM | POA: Diagnosis not present

## 2021-01-08 DIAGNOSIS — Z6841 Body Mass Index (BMI) 40.0 and over, adult: Secondary | ICD-10-CM

## 2021-01-08 DIAGNOSIS — J449 Chronic obstructive pulmonary disease, unspecified: Secondary | ICD-10-CM | POA: Diagnosis not present

## 2021-01-08 DIAGNOSIS — Z79899 Other long term (current) drug therapy: Secondary | ICD-10-CM

## 2021-01-08 DIAGNOSIS — I5033 Acute on chronic diastolic (congestive) heart failure: Secondary | ICD-10-CM

## 2021-01-08 DIAGNOSIS — Z794 Long term (current) use of insulin: Secondary | ICD-10-CM

## 2021-01-08 DIAGNOSIS — L89152 Pressure ulcer of sacral region, stage 2: Secondary | ICD-10-CM | POA: Diagnosis not present

## 2021-01-08 DIAGNOSIS — J431 Panlobular emphysema: Secondary | ICD-10-CM

## 2021-01-08 DIAGNOSIS — N183 Chronic kidney disease, stage 3 unspecified: Secondary | ICD-10-CM | POA: Diagnosis not present

## 2021-01-08 DIAGNOSIS — I130 Hypertensive heart and chronic kidney disease with heart failure and stage 1 through stage 4 chronic kidney disease, or unspecified chronic kidney disease: Secondary | ICD-10-CM

## 2021-01-08 DIAGNOSIS — R5381 Other malaise: Secondary | ICD-10-CM

## 2021-01-08 DIAGNOSIS — I5022 Chronic systolic (congestive) heart failure: Secondary | ICD-10-CM

## 2021-01-08 DIAGNOSIS — Z9981 Dependence on supplemental oxygen: Secondary | ICD-10-CM

## 2021-01-08 DIAGNOSIS — E039 Hypothyroidism, unspecified: Secondary | ICD-10-CM

## 2021-01-08 DIAGNOSIS — J9621 Acute and chronic respiratory failure with hypoxia: Secondary | ICD-10-CM

## 2021-01-08 DIAGNOSIS — I2510 Atherosclerotic heart disease of native coronary artery without angina pectoris: Secondary | ICD-10-CM

## 2021-01-08 DIAGNOSIS — E1121 Type 2 diabetes mellitus with diabetic nephropathy: Secondary | ICD-10-CM

## 2021-01-08 DIAGNOSIS — Z7901 Long term (current) use of anticoagulants: Secondary | ICD-10-CM

## 2021-01-09 ENCOUNTER — Telehealth (INDEPENDENT_AMBULATORY_CARE_PROVIDER_SITE_OTHER): Payer: Medicare HMO | Admitting: Family Medicine

## 2021-01-09 ENCOUNTER — Encounter: Payer: Self-pay | Admitting: Family Medicine

## 2021-01-09 DIAGNOSIS — E039 Hypothyroidism, unspecified: Secondary | ICD-10-CM | POA: Diagnosis not present

## 2021-01-09 DIAGNOSIS — E119 Type 2 diabetes mellitus without complications: Secondary | ICD-10-CM

## 2021-01-09 DIAGNOSIS — N183 Chronic kidney disease, stage 3 unspecified: Secondary | ICD-10-CM

## 2021-01-09 DIAGNOSIS — I5033 Acute on chronic diastolic (congestive) heart failure: Secondary | ICD-10-CM | POA: Diagnosis not present

## 2021-01-09 DIAGNOSIS — I130 Hypertensive heart and chronic kidney disease with heart failure and stage 1 through stage 4 chronic kidney disease, or unspecified chronic kidney disease: Secondary | ICD-10-CM | POA: Diagnosis not present

## 2021-01-09 DIAGNOSIS — I270 Primary pulmonary hypertension: Secondary | ICD-10-CM | POA: Diagnosis not present

## 2021-01-09 DIAGNOSIS — I5022 Chronic systolic (congestive) heart failure: Secondary | ICD-10-CM | POA: Diagnosis not present

## 2021-01-09 DIAGNOSIS — E1122 Type 2 diabetes mellitus with diabetic chronic kidney disease: Secondary | ICD-10-CM | POA: Diagnosis not present

## 2021-01-09 DIAGNOSIS — Z794 Long term (current) use of insulin: Secondary | ICD-10-CM | POA: Diagnosis not present

## 2021-01-09 DIAGNOSIS — J449 Chronic obstructive pulmonary disease, unspecified: Secondary | ICD-10-CM | POA: Diagnosis not present

## 2021-01-09 DIAGNOSIS — E1121 Type 2 diabetes mellitus with diabetic nephropathy: Secondary | ICD-10-CM

## 2021-01-09 DIAGNOSIS — I2510 Atherosclerotic heart disease of native coronary artery without angina pectoris: Secondary | ICD-10-CM

## 2021-01-09 DIAGNOSIS — J9621 Acute and chronic respiratory failure with hypoxia: Secondary | ICD-10-CM | POA: Diagnosis not present

## 2021-01-09 DIAGNOSIS — L89152 Pressure ulcer of sacral region, stage 2: Secondary | ICD-10-CM | POA: Diagnosis not present

## 2021-01-09 NOTE — Progress Notes (Signed)
Subjective:    Patient ID: [name], female    DOB: [birth year], 75 y.o.   MRN: [id number]   HPI: [name] is a 75 y.o. female presenting for face-to-face visit via video.  This is in order to admit her to a rehab facility.  She needs an FL 2.  Her home care provider has recommended that she going for rehab.  She has been completely bedridden since hospitalization several months ago.  She can turn herself over some but she cannot make transfers.  She cannot take herself to the bathroom.  She wears depends for toileting.  Her daughter [name] is giving her total care.  She also has home health coming in.  I'm told today that no one is working on physical therapy significantly to help her get back on her feet.  She says she was just in the hospital so long that she lost the ability to walk.  She uses nasal cannula oxygen 2 L.  With this she doesn't need the inhaler or neb treatments on a regular basis.  She does have a rescue inhaler.  Patient was able to walk prior to admission for pneumonia 6 months ago.  She never recouped that ability After discharge.  She is able to feed herself.  However she needs total care for toileting bathing and grooming.  Her medications were reviewed in detail.  A significant discrepancy is that her sugars are running in the 200 range but she is only taking the Levemir insulin 40 units once every 2 to 3 days.  Depression screen [facility] 2/9 03/22/2020 03/22/2020 06/30/2019 01/03/2019 09/08/2018  Decreased Interest 2 2 0 0 0  Down, Depressed, Hopeless 1 1 0 0 0  PHQ - 2 Score 3 3 0 0 0  Altered sleeping 3 - - - -  Tired, decreased energy 3 - - - -  Change in appetite 1 - - - -  Feeling bad or failure about yourself  0 - - - -  Trouble concentrating 1 - - - -  Moving slowly or fidgety/restless 0 - - - -  Suicidal thoughts 0 - - - -  PHQ-9 Score 11 - - - -  Difficult doing work/chores Somewhat difficult - - - -  Some recent data might be hidden      Relevant past medical, surgical, family and social history reviewed and updated as indicated.  Interim medical history since our last visit reviewed. Allergies and medications reviewed and updated.  ROS:  Review of Systems  Constitutional: Positive for activity change. Negative for appetite change.  HENT: Negative for congestion.   Eyes: Negative for visual disturbance.  Respiratory: Negative for shortness of breath (As long as she uses oxygen 2 L).   Cardiovascular: Negative for chest pain.  Gastrointestinal: Negative for abdominal pain, constipation, diarrhea, nausea and vomiting.  Genitourinary: Positive for enuresis. Negative for difficulty urinating.  Musculoskeletal: Negative for arthralgias and myalgias.  Skin: Positive for wound (Her daughter [name] reports that she has off and on skin breakdown in the sacrum due to her immobility.).  Neurological: Negative for headaches.  Psychiatric/Behavioral: Negative for sleep disturbance.     Social History   Tobacco Use  Smoking Status Never Smoker  Smokeless Tobacco Never Used       Objective:     Wt Readings from Last 3 Encounters:  05/30/20 279 lb 15.8 oz (127 kg)  05/18/20 280 lb (127 kg)  03/22/20 274 lb 2 oz (124.3  kg)     Exam deferred. Pt. Harboring due to COVID 19. Phone visit performed.   Assessment & Plan:   1. Chronic systolic congestive heart failure ([place])   2. Chronic renal impairment, stage 3 (moderate), unspecified whether stage 3a or 3b CKD ([place])   3. Coronary artery disease involving native coronary artery of native heart without angina pectoris   4. Diabetic nephropathy associated with type 2 diabetes mellitus ([place])   5. Hypothyroidism, unspecified type   6. Long-term current use of insulin for diabetes mellitus ([place])     She needs to have CBC, CMP, TSH, free T4, A1c drawn as soon as she is admitted to the facility.  Sooner if we can get it drawn by her home health agency.    Diagnoses and all  orders for this visit:  Chronic systolic congestive heart failure (HCC)  Chronic renal impairment, stage 3 (moderate), unspecified whether stage 3a or 3b CKD ([place])  Coronary artery disease involving native coronary artery of native heart without angina pectoris  Diabetic nephropathy associated with type 2 diabetes mellitus (HCC)  Hypothyroidism, unspecified type  Long-term current use of insulin for diabetes mellitus ([place])    Virtual Visit viaVideo Note  I discussed the limitations, risks, security and privacy concerns of performing an evaluation and management service by virtual video and the availability of in person appointments. The patient was identified with two identifiers. Pt.expressed understanding and agreed to proceed. Pt. Is at home.  She is bedridden.  Both she and her daughter [name] give the history.  Dr. Livia Snellen is in his office.  Follow Up Instructions:   I discussed the assessment and treatment plan with the patient. The patient was provided an opportunity to ask questions and all were answered. The patient agreed with the plan and demonstrated an understanding of the instructions.   The patient was advised to call back or seek an in-person evaluation if the symptoms worsen or if the condition fails to improve as anticipated.   Total minutes including chart review and phone contact time: 41   Follow up plan: Return in about 1 month (around 02/06/2021).  Claretta Fraise, MD [place]

## 2021-01-10 ENCOUNTER — Telehealth: Payer: Self-pay | Admitting: Cardiology

## 2021-01-10 NOTE — Telephone Encounter (Signed)
  Patient Consent for Virtual Visit         [name] has provided verbal consent on 01/10/2021 for a virtual visit (video or telephone).   CONSENT FOR VIRTUAL VISIT FOR:  [name]  By participating in this virtual visit I agree to the following:  I hereby voluntarily request, consent and authorize [facility] and its employed or contracted physicians, physician assistants, nurse practitioners or other licensed health care professionals (the Practitioner), to provide me with telemedicine health care services (the "Services") as deemed necessary by the treating Practitioner. I acknowledge and consent to receive the Services by the Practitioner via telemedicine. I understand that the telemedicine visit will involve communicating with the Practitioner through live audiovisual communication technology and the disclosure of certain medical information by electronic transmission. I acknowledge that I have been given the opportunity to request an in-person assessment or other available alternative prior to the telemedicine visit and am voluntarily participating in the telemedicine visit.  I understand that I have the right to withhold or withdraw my consent to the use of telemedicine in the course of my care at any time, without affecting my right to future care or treatment, and that the Practitioner or I may terminate the telemedicine visit at any time. I understand that I have the right to inspect all information obtained and/or recorded in the course of the telemedicine visit and may receive copies of available information for a reasonable fee.  I understand that some of the potential risks of receiving the Services via telemedicine include:  [place] Delay or interruption in medical evaluation due to technological equipment failure or disruption; . Information transmitted may not be sufficient (e.g. poor resolution of images) to allow for appropriate medical decision making by the  Practitioner; and/or  . In rare instances, security protocols could fail, causing a breach of personal health information.  Furthermore, I acknowledge that it is my responsibility to provide information about my medical history, conditions and care that is complete and accurate to the best of my ability. I acknowledge that Practitioner's advice, recommendations, and/or decision may be based on factors not within their control, such as incomplete or inaccurate data provided by me or distortions of diagnostic images or specimens that may result from electronic transmissions. I understand that the practice of medicine is not an exact science and that Practitioner makes no warranties or guarantees regarding treatment outcomes. I acknowledge that a copy of this consent can be made available to me via my patient portal ([place]), or I can request a printed copy by calling the office of [facility].    I understand that my insurance will be billed for this visit.   I have read or had this consent read to me. . I understand the contents of this consent, which adequately explains the benefits and risks of the Services being provided via telemedicine.  . I have been provided ample opportunity to ask questions regarding this consent and the Services and have had my questions answered to my satisfaction. . I give my informed consent for the services to be provided through the use of telemedicine in my medical care

## 2021-01-16 DIAGNOSIS — L89152 Pressure ulcer of sacral region, stage 2: Secondary | ICD-10-CM | POA: Diagnosis not present

## 2021-01-16 DIAGNOSIS — I270 Primary pulmonary hypertension: Secondary | ICD-10-CM | POA: Diagnosis not present

## 2021-01-16 DIAGNOSIS — I2510 Atherosclerotic heart disease of native coronary artery without angina pectoris: Secondary | ICD-10-CM | POA: Diagnosis not present

## 2021-01-16 DIAGNOSIS — J9621 Acute and chronic respiratory failure with hypoxia: Secondary | ICD-10-CM | POA: Diagnosis not present

## 2021-01-16 DIAGNOSIS — E1122 Type 2 diabetes mellitus with diabetic chronic kidney disease: Secondary | ICD-10-CM | POA: Diagnosis not present

## 2021-01-16 DIAGNOSIS — J449 Chronic obstructive pulmonary disease, unspecified: Secondary | ICD-10-CM | POA: Diagnosis not present

## 2021-01-16 DIAGNOSIS — I5033 Acute on chronic diastolic (congestive) heart failure: Secondary | ICD-10-CM | POA: Diagnosis not present

## 2021-01-16 DIAGNOSIS — N183 Chronic kidney disease, stage 3 unspecified: Secondary | ICD-10-CM | POA: Diagnosis not present

## 2021-01-16 DIAGNOSIS — I130 Hypertensive heart and chronic kidney disease with heart failure and stage 1 through stage 4 chronic kidney disease, or unspecified chronic kidney disease: Secondary | ICD-10-CM | POA: Diagnosis not present

## 2021-01-17 DIAGNOSIS — J449 Chronic obstructive pulmonary disease, unspecified: Secondary | ICD-10-CM | POA: Diagnosis not present

## 2021-01-17 DIAGNOSIS — L89152 Pressure ulcer of sacral region, stage 2: Secondary | ICD-10-CM | POA: Diagnosis not present

## 2021-01-17 DIAGNOSIS — J9621 Acute and chronic respiratory failure with hypoxia: Secondary | ICD-10-CM | POA: Diagnosis not present

## 2021-01-17 DIAGNOSIS — I5033 Acute on chronic diastolic (congestive) heart failure: Secondary | ICD-10-CM | POA: Diagnosis not present

## 2021-01-17 DIAGNOSIS — E1122 Type 2 diabetes mellitus with diabetic chronic kidney disease: Secondary | ICD-10-CM | POA: Diagnosis not present

## 2021-01-17 DIAGNOSIS — I2510 Atherosclerotic heart disease of native coronary artery without angina pectoris: Secondary | ICD-10-CM | POA: Diagnosis not present

## 2021-01-17 DIAGNOSIS — N183 Chronic kidney disease, stage 3 unspecified: Secondary | ICD-10-CM | POA: Diagnosis not present

## 2021-01-17 DIAGNOSIS — I270 Primary pulmonary hypertension: Secondary | ICD-10-CM | POA: Diagnosis not present

## 2021-01-17 DIAGNOSIS — I130 Hypertensive heart and chronic kidney disease with heart failure and stage 1 through stage 4 chronic kidney disease, or unspecified chronic kidney disease: Secondary | ICD-10-CM | POA: Diagnosis not present

## 2021-01-18 DIAGNOSIS — M6281 Muscle weakness (generalized): Secondary | ICD-10-CM | POA: Diagnosis not present

## 2021-01-18 DIAGNOSIS — R2689 Other abnormalities of gait and mobility: Secondary | ICD-10-CM | POA: Diagnosis not present

## 2021-01-24 DIAGNOSIS — I5033 Acute on chronic diastolic (congestive) heart failure: Secondary | ICD-10-CM | POA: Diagnosis not present

## 2021-01-24 DIAGNOSIS — M6281 Muscle weakness (generalized): Secondary | ICD-10-CM | POA: Diagnosis not present

## 2021-01-24 DIAGNOSIS — J9621 Acute and chronic respiratory failure with hypoxia: Secondary | ICD-10-CM | POA: Diagnosis not present

## 2021-01-25 ENCOUNTER — Telehealth: Payer: Self-pay

## 2021-01-25 NOTE — Telephone Encounter (Signed)
Notes faxed.

## 2021-02-01 ENCOUNTER — Other Ambulatory Visit: Payer: Self-pay | Admitting: Family Medicine

## 2021-02-01 ENCOUNTER — Other Ambulatory Visit: Payer: Self-pay | Admitting: Adult Health

## 2021-02-01 DIAGNOSIS — I5022 Chronic systolic (congestive) heart failure: Secondary | ICD-10-CM

## 2021-02-05 NOTE — Telephone Encounter (Signed)
Attempting to contact pt to schedule follow up [facility] Letter mailed 01/2021

## 2021-02-06 ENCOUNTER — Other Ambulatory Visit: Payer: Self-pay | Admitting: Family Medicine

## 2021-02-06 DIAGNOSIS — E1121 Type 2 diabetes mellitus with diabetic nephropathy: Secondary | ICD-10-CM

## 2021-02-08 ENCOUNTER — Other Ambulatory Visit (HOSPITAL_COMMUNITY): Payer: Self-pay

## 2021-02-08 DIAGNOSIS — I5022 Chronic systolic (congestive) heart failure: Secondary | ICD-10-CM

## 2021-02-08 MED ORDER — MIDODRINE HCL 10 MG PO TABS: ORAL_TABLET | ORAL | 0 refills | Status: DC

## 2021-02-08 MED ORDER — SPIRONOLACTONE 25 MG PO TABS: ORAL_TABLET | ORAL | 0 refills | Status: DC

## 2021-02-08 NOTE — Telephone Encounter (Signed)
Multiple attempts have been made for patient to make an appointment for future refills.Please advise requesting refill on Spironolactone and Midodrine.

## 2021-02-11 ENCOUNTER — Other Ambulatory Visit: Payer: Self-pay | Admitting: Family Medicine

## 2021-02-11 ENCOUNTER — Other Ambulatory Visit: Payer: Self-pay | Admitting: Internal Medicine

## 2021-02-11 DIAGNOSIS — I5022 Chronic systolic (congestive) heart failure: Secondary | ICD-10-CM

## 2021-02-11 DIAGNOSIS — Z7901 Long term (current) use of anticoagulants: Secondary | ICD-10-CM

## 2021-02-11 DIAGNOSIS — I482 Chronic atrial fibrillation, unspecified: Secondary | ICD-10-CM

## 2021-02-11 DIAGNOSIS — E1121 Type 2 diabetes mellitus with diabetic nephropathy: Secondary | ICD-10-CM

## 2021-02-11 NOTE — Telephone Encounter (Signed)
Message to pharmacy-one refill must have OV for further refills  Letter mailed  Message to schedulers to assist with follow up appt

## 2021-02-15 DIAGNOSIS — R2689 Other abnormalities of gait and mobility: Secondary | ICD-10-CM | POA: Diagnosis not present

## 2021-02-15 DIAGNOSIS — M6281 Muscle weakness (generalized): Secondary | ICD-10-CM | POA: Diagnosis not present

## 2021-02-21 DIAGNOSIS — J9621 Acute and chronic respiratory failure with hypoxia: Secondary | ICD-10-CM | POA: Diagnosis not present

## 2021-02-21 DIAGNOSIS — I5033 Acute on chronic diastolic (congestive) heart failure: Secondary | ICD-10-CM | POA: Diagnosis not present

## 2021-02-21 DIAGNOSIS — M6281 Muscle weakness (generalized): Secondary | ICD-10-CM | POA: Diagnosis not present

## 2021-03-04 ENCOUNTER — Other Ambulatory Visit: Payer: Self-pay | Admitting: Adult Health

## 2021-03-04 ENCOUNTER — Other Ambulatory Visit: Payer: Self-pay | Admitting: Family Medicine

## 2021-03-04 DIAGNOSIS — E1121 Type 2 diabetes mellitus with diabetic nephropathy: Secondary | ICD-10-CM

## 2021-03-04 DIAGNOSIS — I5022 Chronic systolic (congestive) heart failure: Secondary | ICD-10-CM

## 2021-03-06 ENCOUNTER — Other Ambulatory Visit: Payer: Self-pay | Admitting: *Deleted

## 2021-03-06 MED ORDER — GLOBAL EASE INJECT PEN NEEDLES 32G X 4 MM MISC: 3 refills | Status: DC

## 2021-03-06 NOTE — Telephone Encounter (Signed)
[facility] under going name change to [place], can still send to [place] since [facility] is not in system yet

## 2021-03-08 ENCOUNTER — Other Ambulatory Visit: Payer: Self-pay | Admitting: Family Medicine

## 2021-03-08 DIAGNOSIS — J431 Panlobular emphysema: Secondary | ICD-10-CM

## 2021-03-19 ENCOUNTER — Telehealth: Payer: Self-pay

## 2021-03-19 NOTE — Telephone Encounter (Signed)
[name] called from [facility] stating that she has tried faxing [place] a request for oxygen recertification for patient. Says she will refax.   Please document once received/signed.

## 2021-03-20 NOTE — Telephone Encounter (Signed)
Fax received, filled out and given to Dr. Livia Snellen to sign.

## 2021-03-24 DIAGNOSIS — M6281 Muscle weakness (generalized): Secondary | ICD-10-CM | POA: Diagnosis not present

## 2021-03-24 DIAGNOSIS — I5033 Acute on chronic diastolic (congestive) heart failure: Secondary | ICD-10-CM | POA: Diagnosis not present

## 2021-03-24 DIAGNOSIS — J4551 Severe persistent asthma with (acute) exacerbation: Secondary | ICD-10-CM | POA: Diagnosis not present

## 2021-03-24 DIAGNOSIS — I272 Pulmonary hypertension, unspecified: Secondary | ICD-10-CM | POA: Diagnosis not present

## 2021-03-24 DIAGNOSIS — J9621 Acute and chronic respiratory failure with hypoxia: Secondary | ICD-10-CM | POA: Diagnosis not present

## 2021-03-24 NOTE — Progress Notes (Signed)
Heart Failure TeleHealth Note  Due to national recommendations of social distancing due to [place] 19, Audio/video telehealth visit is felt to be most appropriate for this patient at this time.  See MyChart message from today for patient consent regarding telehealth for [facility]. The patient was identified personally using two identifiers.   Date:  03/28/2021   ID:  [name], DOB [birth year], MRN [id number]  Location: Home  Provider location:  Advanced Heart Failure Clinic Type of Visit: Established patient  PCP:  Claretta Fraise, MD  Cardiologist:  Minus Breeding, MD Primary HF: Millissa Deese  Chief Complaint: Heart Failure follow-up   History of Present Illness:  [name] is a 75 y.o.femalewithmorbid obesity, chronic diastolic HF, CKDStage IIIb, DM2,pulmonary hypertension,permanentatrial fibrillation,chronic hypoxic respiratory failure on home O2 .  Admitted 11/20 [place] from [facility] for further management of acute on chronicdiastolic HF, pulmonary arterial hypertensionand acute kidney injury. HF team admitted. Initially on Norepi but later weaned off.  Diuresed with IV lasix + milrinone. Once diuresed had RHC with mod-severe PAH and mod-severely reduced CO. Discharged on 10 mg midodrine tid due to hypotension.  Placed on sildenafil + macitentan and continued on home oxygen. Discharged with [facility]. Discharge weight 276 pounds.   We have not seen her since 12/20. Schedule today for an audio/video conferencing for a telehealth visit today. She remains homebound under Hospice care. Says she is doing pretty good. Has a Foley catheter in. Denies edema, orthopnea or PND. BP stable on midodrine. Remains on O2    RHC 10/13/19  RA = 19 RV = 84/19 PA = 92/28 (48) PCW = 22 Fick cardiac output/index = 7.7/3.1 Thermo CO/CI = 4.6/1.9 PVR = 5.5 WU (thermo) Ao sat = 99% PA sat = 65%, 67%   ROS: All systems negative except as listed in HPI, PMH and Problem  List.      [name] denies symptoms worrisome for COVID 19.   Past Medical History:  Diagnosis Date  . Acute on chronic diastolic (congestive) heart failure ([place]) 10/12/2019  . Anemia   . Arthritis   . Asthma   . Atrial fibrillation ([place])   . CAD (coronary artery disease)    LAD 60% proximal stenosis mid and distal 60% stenosis 2009.  . Cataract    had surgery  . CHF (congestive heart failure) ([place])   . COPD (chronic obstructive pulmonary disease) ([place])   . Diabetes mellitus   . Gait instability    uses cane  . GERD (gastroesophageal reflux disease)   . Hyperlipidemia   . Hypertension   . Obesity   . Oxygen dependent    2 liter per nasal cannula  . Sleep apnea, obstructive 2008  . Stroke [facility])    2 mini   Past Surgical History:  Procedure Laterality Date  . APPENDECTOMY    . CARPAL TUNNEL RELEASE     right hand  . CATARACT EXTRACTION W/ INTRAOCULAR LENS  IMPLANT, BILATERAL    . CERVICAL SPINE SURGERY     fusion  . CESAREAN SECTION    . CHOLECYSTECTOMY    . COLONOSCOPY  11/22/2002   normal - Dr.Rrourk  . COLONOSCOPY  11/26/2005   incomplete with negative BE (Dr. Rowe Pavy, [place])  . EYE SURGERY    . FEMUR IM NAIL Left 05/08/2013   Procedure: INTRAMEDULLARY (IM) NAIL FEMORAL--LONG(BIOMET SYSTEM) and Zimmer Cables;  Surgeon: Augustin Schooling, MD;  Location: [place];  Service: Orthopedics;  Laterality: Left;  .  HIP SURGERY Left   . KNEE ARTHROSCOPY     twice, left  . RIGHT HEART CATH N/A 10/13/2019   Procedure: Luiz Blare Placement;  Surgeon: Jolaine Artist, MD;  Location: [place] CV LAB;  Service: Cardiovascular;  Laterality: N/A;  . UMBILICAL HERNIA REPAIR    . UPPER GASTROINTESTINAL ENDOSCOPY  11/22/2002   Normal - Dr. Gala Romney     Current Outpatient Medications  Medication Sig Dispense Refill  . Accu-Chek FastClix Lancets MISC Check BS 2 times daily Dx E11.9 204 each 3  . ACCU-CHEK GUIDE test strip Please specify directions, refills and quantity (Patient  taking differently: 1 each by Other route as needed. ) 100 each 6  . acetaminophen (TYLENOL) 500 MG tablet Take 500 mg by mouth every 4 (four) hours as needed for mild pain.     Marland Kitchen albuterol (PROVENTIL) (2.5 MG/3ML) 0.083% nebulizer solution INHALE THE CONTENTS OF 1 VIAL VIA NEBULIZER EVERY 6 HOURS AS NEEDED FOR WHEEZING OR SHORTNESS OF BREATH 150 mL 0  . albuterol (VENTOLIN HFA) 108 (90 Base) MCG/ACT inhaler INHALE 1 PUFF BY MOUTH EVERY 6 HOURS AS NEEDED FOR SHORTNESS OF BREATH 25.5 g 0  . apixaban (ELIQUIS) 5 MG TABS tablet Take 1 tablet (5 mg total) by mouth 2 (two) times daily. Must have office visit for further refills [phone] 60 tablet 0  . Catheters (CATHETER RED RUBBER COATED) MISC Pure Elza Rafter brand is preferred. Use every 4 hours as needed for urinary incontinence. 180 each 5  . Dulaglutide (TRULICITY) 1.5 FB/5.1WC SOPN Inject content of one pen under the skin weekly 2 mL 2  . FEROSUL 325 (65 Fe) MG tablet TAKE ONE TABLET BY MOUTH DAILY WITH BREAKFAST 90 tablet 0  . fish oil-omega-3 fatty acids 1000 MG capsule Take 2 g by mouth daily.      Marland Kitchen gabapentin (NEURONTIN) 300 MG capsule Take 1 capsule (300 mg total) by mouth at bedtime. (NEEDS TO BE SEEN BEFORE NEXT REFILL) 30 capsule 0  . glucose blood (ACCU-CHEK AVIVA PLUS) test strip Check BS 2 times daily Dx E11.9 200 each 3  . insulin detemir (LEVEMIR FLEXTOUCH) 100 UNIT/ML FlexPen INJECT 40 UNITS UNDER THE SKIN (SUBCUTANEOUSLY) TWICE DAILY  (NEEDS TO BE SEEN BEFORE NEXT REFILL) 24 mL 0  . Insulin Pen Needle (GLOBAL EASE INJECT PEN NEEDLES) 32G X 4 MM MISC USE AS DIRECTED WITH INSULIN TWICE DAILY 200 each 3  . ipratropium-albuterol (DUONEB) 0.5-2.5 (3) MG/3ML SOLN INHALE THE CONTENTS OF 1 VIAL VIA NEBULIZER EVERY 6 HOURS AS NEEDED FOR SHORTNESS OF BREATH 360 mL 0  . levothyroxine (SYNTHROID) 50 MCG tablet TAKE ONE TABLET BY MOUTH DAILY 90 tablet 0  . macitentan (OPSUMIT) 10 MG tablet Take 1 tablet (10 mg total) by mouth daily. 30 tablet 6  .  midodrine (PROAMATINE) 10 MG tablet TAKE ONE TABLET BY MOUTH THREE TIMES DAILY. NEEDS APPOINTMENT FOR FURTHER REFILLS. 90 tablet 0  . midodrine (PROAMATINE) 10 MG tablet TAKE ONE TABLET BY MOUTH THREE TIMES DAILY (NEEDS APPOINTMENT FOR FUTHER REFILLS) 90 tablet 0  . ondansetron (ZOFRAN-ODT) 8 MG disintegrating tablet Take 1 tablet (8 mg total) by mouth every 6 (six) hours as needed for nausea or vomiting. 20 tablet 1  . OXYGEN Inhale 2 L into the lungs continuous.    . polyethylene glycol (MIRALAX / GLYCOLAX) 17 g packet Take 17 g by mouth 2 (two) times daily. 14 each 0  . potassium chloride SA (KLOR-CON) 20 MEQ tablet TAKE ONE TABLET BY  MOUTH DAILY 90 tablet 3  . sildenafil (REVATIO) 20 MG tablet Take 2 tablets (40 mg total) by mouth 3 (three) times daily. 180 tablet 6  . spironolactone (ALDACTONE) 25 MG tablet TAKE 1/2 TABLET BY MOUTH DAILY. NEEDS APPOINTMENT FOR FURTHER REFILLS. 15 tablet 0  . spironolactone (ALDACTONE) 25 MG tablet TAKE 1/2 TABLET BY MOUTH DAILY (NEEDS APPOINTMENT FOR FURTHER REFILLS) 15 tablet 0  . torsemide (DEMADEX) 20 MG tablet Take 2 tablets (40 mg total) by mouth daily. Must have office visit for further refills [phone] 60 tablet 0  . TRULICITY 4.16 SA/6.3KZ SOPN INJECT 0.5 ML UNDER THE SKIN (SUBCUTANEOUSLY) ONCE WEEKLY ON SATURDAY 6 mL 0  . vitamin B-12 (CYANOCOBALAMIN) 500 MCG tablet Take 500 mcg by mouth daily.     No current facility-administered medications for this encounter.    Allergies:   Lisinopril and Codeine   Social History:  The patient  reports that she has never smoked. She has never used smokeless tobacco. She reports that she does not drink alcohol and does not use drugs.   Family History:  The patient's family history includes Alzheimer's disease in her father and mother; Asthma in her daughter and grandchild; Breast cancer in her sister; Cancer in her brother, mother, sister, and sister; Colon cancer in her mother; Diabetes in her daughter,  mother, sister, and another family member; Heart attack in her mother, sister, and another family member; Heart disease in her brother, mother, and sister; Heart murmur in her brother; Kidney disease in her mother; Migraines in her sister.   ROS:  Please see the history of present illness.   All other systems are personally reviewed and negative.   Exam:  (Video/Tele Health Call; Exam is subjective and or/visual.) General:  Speaks in full sentences. No resp difficulty. Lungs: Normal respiratory effort with conversation.  Abdomen: Non-distended per patient report Extremities: Pt denies edema. Neuro: Alert & oriented x 3.   Recent Labs: 05/18/2020: B Natriuretic Peptide 425.0 05/31/2020: ALT 16; BUN 18; Creatinine, Ser 1.04; Hemoglobin 9.2; Platelets 152; Potassium 4.0; Sodium 139  Personally reviewed   Wt Readings from Last 3 Encounters:  05/30/20 127 kg (279 lb 15.8 oz)  05/18/20 127 kg (280 lb)  03/22/20 124.3 kg (274 lb 2 oz)      ASSESSMENT AND PLAN:  1. PAH with cor pulmonale, end-stage - RHC numbers as above, likely multifactorial WHO Group, I,II,III - NYHA IV Now bedbound - Volume status ok today. Continue current dose of torsemide.  - Continue midodrine 10 mg tid.  2. Chronic diastolic heart failurewith R>L HF -Echo 7/20 EF 60-65% RV reportedly normal with RVSP 85 - NYHA IV Volume status is good. No SOB or hypotension  3. Chronic renal failure- stage IV - Renal u/s with severe bilateral cortical thinning - Not HD candidate - Check BMET today.   4 Permanent AF - On eliquis. No rep - Rate controlled.   5. Chronic hypoxic respiratory failure on home O2 - non-smoker. Suspect OSA /OHS/PAH   COVID screen The patient does not have any symptoms that suggest any further testing/ screening at this time.  Social distancing reinforced today.  Recommended follow-up:  As needed.   Relevant cardiac medications were reviewed at length with the patient today.   The  patient does not have concerns regarding their medications at this time.   The following changes were made today:  As above  Today, I have spent 12 minutes with the patient with telehealth technology discussing  the above issues .    Signed, Glori Bickers, MD  03/28/2021 3:02 PM  Advanced Heart Failure [place] [address] [place] [address] [phone] (office) [phone] (fax)

## 2021-03-28 ENCOUNTER — Ambulatory Visit (HOSPITAL_COMMUNITY)
Admission: RE | Admit: 2021-03-28 | Discharge: 2021-03-28 | Disposition: A | Discharge status: Home / Self Care | Source: Ambulatory Visit | Attending: Internal Medicine | Admitting: Internal Medicine

## 2021-03-28 ENCOUNTER — Other Ambulatory Visit: Payer: Self-pay

## 2021-03-28 DIAGNOSIS — I5032 Chronic diastolic (congestive) heart failure: Secondary | ICD-10-CM

## 2021-03-28 DIAGNOSIS — I482 Chronic atrial fibrillation, unspecified: Secondary | ICD-10-CM

## 2021-03-28 DIAGNOSIS — I270 Primary pulmonary hypertension: Secondary | ICD-10-CM

## 2021-03-28 DIAGNOSIS — N183 Chronic kidney disease, stage 3 unspecified: Secondary | ICD-10-CM

## 2021-04-09 NOTE — Progress Notes (Signed)
Virtual Visit via Telephone Note   This visit type was conducted due to national recommendations for restrictions regarding the COVID-19 Pandemic (e.g. social distancing) in an effort to limit this patient's exposure and mitigate transmission in our community.  Due to her co-morbid illnesses, this patient is at least at moderate risk for complications without adequate follow up.  This format is felt to be most appropriate for this patient at this time.  The patient did not have access to video technology/had technical difficulties with video requiring transitioning to audio format only (telephone).  All issues noted in this document were discussed and addressed.  No physical exam could be performed with this format.  Please refer to the patient's chart for her  consent to telehealth for [facility].    Date:  04/10/2021   ID:  [name], DOB 1946/01/12, MRN [id number] The patient was identified using 2 identifiers.  Patient Location: Home Provider Location: Office/Clinic   PCP:  Claretta Fraise, MD   [place] Providers Cardiologist:  Minus Breeding, MD     Evaluation Performed:  Follow-Up Visit  Chief Complaint:  SOB  History of Present Illness:    [name] is a 75 y.o. female with who presents for follow up of  nonobstructive coronary disease and diastolic heart failure and atrial fibrillation.    She has chronic oxygen use.   Since I last saw her she has had no new hospitalizations.  Is been quite a while actually.  I do see some blood work from December.  She had very mild anemia.  Renal function was stable.  She is bedbound.  She has home health nurses.  She has family that stays with her.  Her med list is as below and she has no issues with her anticoagulation that she can identify.  There is no bleeding or bowel movements.  She is not having any new shortness of breath, PND or orthopnea.  She denies any chest pain.   The patient does not have symptoms  concerning for COVID-19 infection (fever, chills, cough, or new shortness of breath).    Past Medical History:  Diagnosis Date  . Acute on chronic diastolic (congestive) heart failure ([place]) 10/12/2019  . Anemia   . Arthritis   . Asthma   . Atrial fibrillation ([place])   . CAD (coronary artery disease)    LAD 60% proximal stenosis mid and distal 60% stenosis 2009.  . Cataract    had surgery  . CHF (congestive heart failure) ([place])   . COPD (chronic obstructive pulmonary disease) ([place])   . Diabetes mellitus   . Gait instability    uses cane  . GERD (gastroesophageal reflux disease)   . Hyperlipidemia   . Hypertension   . Obesity   . Oxygen dependent    2 liter per nasal cannula  . Sleep apnea, obstructive 2008  . Stroke [facility])    2 mini   Past Surgical History:  Procedure Laterality Date  . APPENDECTOMY    . CARPAL TUNNEL RELEASE     right hand  . CATARACT EXTRACTION W/ INTRAOCULAR LENS  IMPLANT, BILATERAL    . CERVICAL SPINE SURGERY     fusion  . CESAREAN SECTION    . CHOLECYSTECTOMY    . COLONOSCOPY  11/22/2002   normal - Dr.Rrourk  . COLONOSCOPY  11/26/2005   incomplete with negative BE (Dr. Rowe Pavy, [place])  . EYE SURGERY    . FEMUR IM NAIL Left 05/08/2013  Procedure: INTRAMEDULLARY (IM) NAIL FEMORAL--LONG(BIOMET SYSTEM) and Zimmer Cables;  Surgeon: Augustin Schooling, MD;  Location: [place];  Service: Orthopedics;  Laterality: Left;  . HIP SURGERY Left   . KNEE ARTHROSCOPY     twice, left  . RIGHT HEART CATH N/A 10/13/2019   Procedure: Luiz Blare Placement;  Surgeon: Jolaine Artist, MD;  Location: [place] CV LAB;  Service: Cardiovascular;  Laterality: N/A;  . UMBILICAL HERNIA REPAIR    . UPPER GASTROINTESTINAL ENDOSCOPY  11/22/2002   Normal - Dr. Gala Romney     Current Meds  Medication Sig  . acetaminophen (TYLENOL) 500 MG tablet Take 500 mg by mouth every 4 (four) hours as needed for mild pain.   Marland Kitchen albuterol (PROVENTIL) (2.5 MG/3ML) 0.083% nebulizer solution INHALE THE  CONTENTS OF 1 VIAL VIA NEBULIZER EVERY 6 HOURS AS NEEDED FOR WHEEZING OR SHORTNESS OF BREATH  . albuterol (VENTOLIN HFA) 108 (90 Base) MCG/ACT inhaler INHALE 1 PUFF BY MOUTH EVERY 6 HOURS AS NEEDED FOR SHORTNESS OF BREATH  . apixaban (ELIQUIS) 5 MG TABS tablet Take 1 tablet (5 mg total) by mouth 2 (two) times daily. Must have office visit for further refills [phone]  . Dulaglutide (TRULICITY) 1.5 UY/4.0HK SOPN Inject content of one pen under the skin weekly  . FEROSUL 325 (65 Fe) MG tablet TAKE ONE TABLET BY MOUTH DAILY WITH BREAKFAST  . gabapentin (NEURONTIN) 300 MG capsule Take 1 capsule (300 mg total) by mouth at bedtime. (NEEDS TO BE SEEN BEFORE NEXT REFILL)  . insulin detemir (LEVEMIR FLEXTOUCH) 100 UNIT/ML FlexPen INJECT 40 UNITS UNDER THE SKIN (SUBCUTANEOUSLY) TWICE DAILY  (NEEDS TO BE SEEN BEFORE NEXT REFILL)  . ipratropium-albuterol (DUONEB) 0.5-2.5 (3) MG/3ML SOLN INHALE THE CONTENTS OF 1 VIAL VIA NEBULIZER EVERY 6 HOURS AS NEEDED FOR SHORTNESS OF BREATH  . levothyroxine (SYNTHROID) 50 MCG tablet TAKE ONE TABLET BY MOUTH DAILY  . ondansetron (ZOFRAN-ODT) 8 MG disintegrating tablet Take 1 tablet (8 mg total) by mouth every 6 (six) hours as needed for nausea or vomiting.  . polyethylene glycol (MIRALAX / GLYCOLAX) 17 g packet Take 17 g by mouth 2 (two) times daily.  . potassium chloride SA (KLOR-CON) 20 MEQ tablet TAKE ONE TABLET BY MOUTH DAILY  . sildenafil (REVATIO) 20 MG tablet Take 2 tablets (40 mg total) by mouth 3 (three) times daily.  Marland Kitchen spironolactone (ALDACTONE) 25 MG tablet TAKE 1/2 TABLET BY MOUTH DAILY. NEEDS APPOINTMENT FOR FURTHER REFILLS.  Marland Kitchen torsemide (DEMADEX) 20 MG tablet Take 2 tablets (40 mg total) by mouth daily. Must have office visit for further refills [phone]  . vitamin B-12 (CYANOCOBALAMIN) 500 MCG tablet Take 500 mcg by mouth daily.     Allergies:   Lisinopril and Codeine   Social History   Tobacco Use  . Smoking status: Never Smoker  . Smokeless  tobacco: Never Used  Vaping Use  . Vaping Use: Never used  Substance Use Topics  . Alcohol use: No  . Drug use: No     Family Hx: The patient's family history includes Alzheimer's disease in her father and mother; Asthma in her daughter and grandchild; Breast cancer in her sister; Cancer in her brother, mother, sister, and sister; Colon cancer in her mother; Diabetes in her daughter, mother, sister, and another family member; Heart attack in her mother, sister, and another family member; Heart disease in her brother, mother, and sister; Heart murmur in her brother; Kidney disease in her mother; Migraines in her sister.  ROS:  Please see the history of present illness.     All other systems reviewed and are negative.   Prior CV studies:   The following studies were reviewed today:  None  Labs/Other Tests and Data Reviewed:    EKG:  No ECG reviewed.  Recent Labs: 05/18/2020: B Natriuretic Peptide 425.0 05/31/2020: ALT 16; BUN 18; Creatinine, Ser 1.04; Hemoglobin 9.2; Platelets 152; Potassium 4.0; Sodium 139   Recent Lipid Panel Lab Results  Component Value Date/Time   CHOL 149 06/23/2019 05:56 AM   CHOL 157 09/08/2018 04:43 PM   TRIG 60 06/23/2019 05:56 AM   TRIG 188 (H) 09/24/2015 11:30 AM   HDL 55 06/23/2019 05:56 AM   HDL 53 09/08/2018 04:43 PM   HDL 42 09/24/2015 11:30 AM   CHOLHDL 2.7 06/23/2019 05:56 AM   LDLCALC 82 06/23/2019 05:56 AM   LDLCALC 88 09/08/2018 04:43 PM   LDLCALC 33 09/22/2013 12:07 PM    Wt Readings from Last 3 Encounters:  05/30/20 279 lb 15.8 oz (127 kg)  05/18/20 280 lb (127 kg)  03/22/20 274 lb 2 oz (124.3 kg)     Risk Assessment/Calculations:      Objective:    Vital Signs:  There were no vitals taken for this visit.   VITAL SIGNS:  reviewed Patient unable to take vital signs  ASSESSMENT & PLAN:     ATRIAL FIBRILLATION: She tolerates anticoagulation. Ms.Wendy L Williamshas a CHA2DS2 - VASc score of 7. I am going to check  to see if we can do home health blood draws just to check creatinine and CBC.  HTN:The bloodpressure is  recorded by her home health nurses and she thinks it is always "normal."  CAD:  She has had no chest pain symptoms.  No change in therapy.  DIASTOLIC HF:    Following the report she is not having any breathing issues.  No change in therapy.  EDEMA:  This apparently is chronic for the patient.  Is not increased.  Again I will try to check home basic metabolic profile.  DM:   Last A1c was 7.8 that I can see.  I will defer to Claretta Fraise, MD        COVID-19 Education: The signs and symptoms of COVID-19 were discussed with the patient and how to seek care for testing (follow up with PCP or arrange E-visit).    Time:   Today, I have spent 16 minutes with the patient with telehealth technology discussing the above problems.     Medication Adjustments/Labs and Tests Ordered: Current medicines are reviewed at length with the patient today.  Concerns regarding medicines are outlined above.   Tests Ordered: No orders of the defined types were placed in this encounter.   Medication Changes: No orders of the defined types were placed in this encounter.   Follow Up:  Virtual Visit  in one year  Signed, Minus Breeding, MD  04/10/2021 12:39 PM    [place]

## 2021-04-10 ENCOUNTER — Encounter: Payer: Self-pay | Admitting: Cardiology

## 2021-04-10 ENCOUNTER — Other Ambulatory Visit: Payer: Self-pay

## 2021-04-10 ENCOUNTER — Telehealth (INDEPENDENT_AMBULATORY_CARE_PROVIDER_SITE_OTHER): Payer: Medicare Other | Admitting: Cardiology

## 2021-04-10 DIAGNOSIS — I482 Chronic atrial fibrillation, unspecified: Secondary | ICD-10-CM

## 2021-04-10 DIAGNOSIS — I5032 Chronic diastolic (congestive) heart failure: Secondary | ICD-10-CM

## 2021-04-10 DIAGNOSIS — I2510 Atherosclerotic heart disease of native coronary artery without angina pectoris: Secondary | ICD-10-CM

## 2021-04-10 DIAGNOSIS — M7989 Other specified soft tissue disorders: Secondary | ICD-10-CM

## 2021-04-16 ENCOUNTER — Telehealth: Payer: Self-pay

## 2021-04-16 NOTE — Telephone Encounter (Signed)
Home health requires a video or in person visit.

## 2021-04-16 NOTE — Telephone Encounter (Signed)
Patient is currently under Hospice Care and daughter wants to switch her to home health care.  Daughter is upset because Hospice is threatening to contact adult protective services to have her mother placed in a facility because she has a bed sore.  Daughter wants to discontinue Hospice.  I offered her an appointment to see you to discuss home health but daughter reports patient is completely bed ridden and she cannot get her here without transportation by an ambulance.  Can you do a phone visit or can home health be ordered without seeing her?

## 2021-04-17 NOTE — Telephone Encounter (Signed)
RETURNED CALL, NO ANSWER, LEFT MESSAGE TO CALL BACK 

## 2021-04-22 NOTE — Telephone Encounter (Signed)
Tried to call again and no answer.  Voicemail has previously been left and no call back was received.  Encounter closed.

## 2021-05-08 ENCOUNTER — Other Ambulatory Visit: Payer: Self-pay | Admitting: Family Medicine

## 2021-05-08 ENCOUNTER — Other Ambulatory Visit: Payer: Self-pay | Admitting: Internal Medicine

## 2021-05-08 DIAGNOSIS — Z7901 Long term (current) use of anticoagulants: Secondary | ICD-10-CM

## 2021-05-08 DIAGNOSIS — I482 Chronic atrial fibrillation, unspecified: Secondary | ICD-10-CM

## 2021-05-08 DIAGNOSIS — I5022 Chronic systolic (congestive) heart failure: Secondary | ICD-10-CM

## 2021-05-08 DIAGNOSIS — E1121 Type 2 diabetes mellitus with diabetic nephropathy: Secondary | ICD-10-CM

## 2021-05-14 ENCOUNTER — Other Ambulatory Visit (HOSPITAL_COMMUNITY): Payer: Self-pay | Admitting: *Deleted

## 2021-05-14 DIAGNOSIS — Z7901 Long term (current) use of anticoagulants: Secondary | ICD-10-CM

## 2021-05-14 DIAGNOSIS — I5022 Chronic systolic (congestive) heart failure: Secondary | ICD-10-CM

## 2021-05-14 DIAGNOSIS — I482 Chronic atrial fibrillation, unspecified: Secondary | ICD-10-CM

## 2021-05-14 MED ORDER — MIDODRINE HCL 10 MG PO TABS: ORAL_TABLET | ORAL | 0 refills | Status: DC

## 2021-05-14 MED ORDER — ELIQUIS 5 MG PO TABS: ORAL_TABLET | ORAL | 0 refills | Status: DC

## 2021-05-14 MED ORDER — SPIRONOLACTONE 25 MG PO TABS: 12.5000 mg | ORAL_TABLET | Freq: Every day | ORAL | 0 refills | Status: DC

## 2021-05-24 DIAGNOSIS — R159 Full incontinence of feces: Secondary | ICD-10-CM | POA: Diagnosis not present

## 2021-05-24 DIAGNOSIS — S3091XA Unspecified superficial injury of lower back and pelvis, initial encounter: Secondary | ICD-10-CM | POA: Diagnosis not present

## 2021-05-24 DIAGNOSIS — J80 Acute respiratory distress syndrome: Secondary | ICD-10-CM | POA: Diagnosis not present

## 2021-05-24 DIAGNOSIS — I517 Cardiomegaly: Secondary | ICD-10-CM | POA: Diagnosis not present

## 2021-05-24 DIAGNOSIS — R0600 Dyspnea, unspecified: Secondary | ICD-10-CM | POA: Diagnosis not present

## 2021-05-24 DIAGNOSIS — N183 Chronic kidney disease, stage 3 unspecified: Secondary | ICD-10-CM | POA: Diagnosis not present

## 2021-05-24 DIAGNOSIS — J449 Chronic obstructive pulmonary disease, unspecified: Secondary | ICD-10-CM | POA: Diagnosis not present

## 2021-05-24 DIAGNOSIS — I4891 Unspecified atrial fibrillation: Secondary | ICD-10-CM | POA: Diagnosis not present

## 2021-05-24 DIAGNOSIS — R069 Unspecified abnormalities of breathing: Secondary | ICD-10-CM | POA: Diagnosis not present

## 2021-05-24 DIAGNOSIS — I130 Hypertensive heart and chronic kidney disease with heart failure and stage 1 through stage 4 chronic kidney disease, or unspecified chronic kidney disease: Secondary | ICD-10-CM | POA: Diagnosis not present

## 2021-05-24 DIAGNOSIS — J90 Pleural effusion, not elsewhere classified: Secondary | ICD-10-CM | POA: Diagnosis not present

## 2021-05-24 DIAGNOSIS — I509 Heart failure, unspecified: Secondary | ICD-10-CM | POA: Diagnosis not present

## 2021-05-24 DIAGNOSIS — E1122 Type 2 diabetes mellitus with diabetic chronic kidney disease: Secondary | ICD-10-CM | POA: Diagnosis not present

## 2021-05-24 DIAGNOSIS — R9431 Abnormal electrocardiogram [ECG] [EKG]: Secondary | ICD-10-CM | POA: Diagnosis not present

## 2021-05-24 DIAGNOSIS — R0902 Hypoxemia: Secondary | ICD-10-CM | POA: Diagnosis not present

## 2021-05-24 DIAGNOSIS — E1165 Type 2 diabetes mellitus with hyperglycemia: Secondary | ICD-10-CM | POA: Diagnosis not present

## 2021-05-25 DIAGNOSIS — R531 Weakness: Secondary | ICD-10-CM | POA: Diagnosis not present

## 2021-05-25 DIAGNOSIS — Z743 Need for continuous supervision: Secondary | ICD-10-CM | POA: Diagnosis not present

## 2021-06-07 ENCOUNTER — Telehealth: Payer: Self-pay | Admitting: Family Medicine

## 2021-06-07 MED ORDER — NYSTATIN 100000 UNIT/GM EX CREA: 1.0000 "application " | TOPICAL_CREAM | Freq: Two times a day (BID) | CUTANEOUS | 1 refills | Status: AC

## 2021-06-07 NOTE — Telephone Encounter (Signed)
April called from Hospice to let Dr Livia Snellen know that pt ran out of her Nystatin Cream Rx so she called [facility] pharmacy in [place] requesting a refill and gave them a verbal order to fill it.  Also wanted to let Dr Livia Snellen know that pt is declining to take her tramadol Rx for pain. She is unsure why. Says pt wants to take something else. Explained to pt that she needs to try taking the tramadol first to see if it will help her and can revisit the issue after the weekend to see if medicine helped or if she needs something else.

## 2021-06-11 ENCOUNTER — Other Ambulatory Visit (HOSPITAL_COMMUNITY): Payer: Self-pay | Admitting: Unknown Physician Specialty

## 2021-06-11 ENCOUNTER — Other Ambulatory Visit (HOSPITAL_COMMUNITY): Payer: Self-pay | Admitting: *Deleted

## 2021-06-11 DIAGNOSIS — I5022 Chronic systolic (congestive) heart failure: Secondary | ICD-10-CM

## 2021-06-11 DIAGNOSIS — I482 Chronic atrial fibrillation, unspecified: Secondary | ICD-10-CM

## 2021-06-11 DIAGNOSIS — Z7901 Long term (current) use of anticoagulants: Secondary | ICD-10-CM

## 2021-06-11 MED ORDER — SPIRONOLACTONE 25 MG PO TABS: 12.5000 mg | ORAL_TABLET | Freq: Every day | ORAL | 3 refills | Status: AC

## 2021-06-11 MED ORDER — TORSEMIDE 20 MG PO TABS: 40.0000 mg | ORAL_TABLET | Freq: Every day | ORAL | 6 refills | Status: DC

## 2021-06-11 MED ORDER — APIXABAN 5 MG PO TABS: ORAL_TABLET | ORAL | 3 refills | Status: AC

## 2021-06-14 ENCOUNTER — Telehealth: Payer: Self-pay | Admitting: Family Medicine

## 2021-06-16 DIAGNOSIS — J90 Pleural effusion, not elsewhere classified: Secondary | ICD-10-CM | POA: Diagnosis not present

## 2021-06-16 DIAGNOSIS — E119 Type 2 diabetes mellitus without complications: Secondary | ICD-10-CM | POA: Diagnosis not present

## 2021-06-16 DIAGNOSIS — Z9689 Presence of other specified functional implants: Secondary | ICD-10-CM | POA: Diagnosis not present

## 2021-06-16 DIAGNOSIS — I110 Hypertensive heart disease with heart failure: Secondary | ICD-10-CM | POA: Diagnosis not present

## 2021-06-16 DIAGNOSIS — N390 Urinary tract infection, site not specified: Secondary | ICD-10-CM | POA: Diagnosis not present

## 2021-06-16 DIAGNOSIS — R7881 Bacteremia: Secondary | ICD-10-CM | POA: Diagnosis not present

## 2021-06-16 DIAGNOSIS — Z20822 Contact with and (suspected) exposure to covid-19: Secondary | ICD-10-CM | POA: Diagnosis not present

## 2021-06-16 DIAGNOSIS — R0689 Other abnormalities of breathing: Secondary | ICD-10-CM | POA: Diagnosis not present

## 2021-06-16 DIAGNOSIS — B9689 Other specified bacterial agents as the cause of diseases classified elsewhere: Secondary | ICD-10-CM | POA: Diagnosis not present

## 2021-06-16 DIAGNOSIS — J189 Pneumonia, unspecified organism: Secondary | ICD-10-CM | POA: Diagnosis not present

## 2021-06-16 DIAGNOSIS — J9611 Chronic respiratory failure with hypoxia: Secondary | ICD-10-CM | POA: Diagnosis not present

## 2021-06-16 DIAGNOSIS — R0602 Shortness of breath: Secondary | ICD-10-CM | POA: Diagnosis not present

## 2021-06-16 DIAGNOSIS — I4821 Permanent atrial fibrillation: Secondary | ICD-10-CM | POA: Diagnosis not present

## 2021-06-16 DIAGNOSIS — E871 Hypo-osmolality and hyponatremia: Secondary | ICD-10-CM | POA: Diagnosis not present

## 2021-06-16 DIAGNOSIS — I5033 Acute on chronic diastolic (congestive) heart failure: Secondary | ICD-10-CM | POA: Diagnosis not present

## 2021-06-16 DIAGNOSIS — Z7901 Long term (current) use of anticoagulants: Secondary | ICD-10-CM | POA: Diagnosis not present

## 2021-06-16 DIAGNOSIS — N183 Chronic kidney disease, stage 3 unspecified: Secondary | ICD-10-CM | POA: Diagnosis not present

## 2021-06-16 DIAGNOSIS — I4891 Unspecified atrial fibrillation: Secondary | ICD-10-CM | POA: Diagnosis not present

## 2021-06-16 DIAGNOSIS — E441 Mild protein-calorie malnutrition: Secondary | ICD-10-CM | POA: Diagnosis not present

## 2021-06-16 DIAGNOSIS — E1122 Type 2 diabetes mellitus with diabetic chronic kidney disease: Secondary | ICD-10-CM | POA: Diagnosis not present

## 2021-06-16 DIAGNOSIS — R0902 Hypoxemia: Secondary | ICD-10-CM | POA: Diagnosis not present

## 2021-06-16 DIAGNOSIS — Z9981 Dependence on supplemental oxygen: Secondary | ICD-10-CM | POA: Diagnosis not present

## 2021-06-16 DIAGNOSIS — M6281 Muscle weakness (generalized): Secondary | ICD-10-CM | POA: Diagnosis not present

## 2021-06-16 DIAGNOSIS — R2689 Other abnormalities of gait and mobility: Secondary | ICD-10-CM | POA: Diagnosis not present

## 2021-06-16 DIAGNOSIS — L89152 Pressure ulcer of sacral region, stage 2: Secondary | ICD-10-CM | POA: Diagnosis not present

## 2021-06-16 DIAGNOSIS — Z79899 Other long term (current) drug therapy: Secondary | ICD-10-CM | POA: Diagnosis not present

## 2021-06-16 DIAGNOSIS — I509 Heart failure, unspecified: Secondary | ICD-10-CM | POA: Diagnosis not present

## 2021-06-16 DIAGNOSIS — J449 Chronic obstructive pulmonary disease, unspecified: Secondary | ICD-10-CM | POA: Diagnosis not present

## 2021-06-16 DIAGNOSIS — I5032 Chronic diastolic (congestive) heart failure: Secondary | ICD-10-CM | POA: Diagnosis not present

## 2021-06-16 DIAGNOSIS — I130 Hypertensive heart and chronic kidney disease with heart failure and stage 1 through stage 4 chronic kidney disease, or unspecified chronic kidney disease: Secondary | ICD-10-CM | POA: Diagnosis not present

## 2021-06-18 DIAGNOSIS — R7881 Bacteremia: Secondary | ICD-10-CM | POA: Insufficient documentation

## 2021-06-18 NOTE — Telephone Encounter (Signed)
NA / VM full. Pt will NTBS for any orders to be placed. Last OV was in February.

## 2021-06-19 ENCOUNTER — Telehealth: Payer: Self-pay | Admitting: Family Medicine

## 2021-06-19 NOTE — Telephone Encounter (Signed)
Pt needs pallative care order put in

## 2021-06-19 NOTE — Telephone Encounter (Signed)
Pt. Needs to be seen for this.(Video or live) Thanks, WS

## 2021-06-20 DIAGNOSIS — L98421 Non-pressure chronic ulcer of back limited to breakdown of skin: Secondary | ICD-10-CM | POA: Insufficient documentation

## 2021-06-20 NOTE — Telephone Encounter (Signed)
Called Stephanie at [facility] and she will reach out to family to get this appointment set up for patient.

## 2021-06-26 ENCOUNTER — Telehealth: Payer: Self-pay | Admitting: *Deleted

## 2021-06-26 ENCOUNTER — Telehealth: Payer: Self-pay | Admitting: Family Medicine

## 2021-06-26 DIAGNOSIS — I4821 Permanent atrial fibrillation: Secondary | ICD-10-CM | POA: Diagnosis not present

## 2021-06-26 DIAGNOSIS — T83511D Infection and inflammatory reaction due to indwelling urethral catheter, subsequent encounter: Secondary | ICD-10-CM | POA: Diagnosis not present

## 2021-06-26 DIAGNOSIS — N183 Chronic kidney disease, stage 3 unspecified: Secondary | ICD-10-CM | POA: Diagnosis not present

## 2021-06-26 DIAGNOSIS — L89152 Pressure ulcer of sacral region, stage 2: Secondary | ICD-10-CM | POA: Diagnosis not present

## 2021-06-26 DIAGNOSIS — E1122 Type 2 diabetes mellitus with diabetic chronic kidney disease: Secondary | ICD-10-CM | POA: Diagnosis not present

## 2021-06-26 DIAGNOSIS — I5032 Chronic diastolic (congestive) heart failure: Secondary | ICD-10-CM | POA: Diagnosis not present

## 2021-06-26 DIAGNOSIS — N390 Urinary tract infection, site not specified: Secondary | ICD-10-CM | POA: Diagnosis not present

## 2021-06-26 DIAGNOSIS — I130 Hypertensive heart and chronic kidney disease with heart failure and stage 1 through stage 4 chronic kidney disease, or unspecified chronic kidney disease: Secondary | ICD-10-CM | POA: Diagnosis not present

## 2021-06-26 DIAGNOSIS — L89312 Pressure ulcer of right buttock, stage 2: Secondary | ICD-10-CM | POA: Diagnosis not present

## 2021-06-26 MED ORDER — ACCU-CHEK GUIDE W/DEVICE KIT: PACK | 0 refills | Status: DC

## 2021-06-26 MED ORDER — ACCU-CHEK FASTCLIX LANCETS MISC: 3 refills | Status: DC

## 2021-06-26 MED ORDER — ACCU-CHEK GUIDE VI STRP: ORAL_STRIP | 3 refills | Status: DC

## 2021-06-26 NOTE — Telephone Encounter (Signed)
Tc from [place] w/ [facility] Pt needs glucometer sent to pharmacy, [facility], orders from [facility] had her to check BS BID but she does not have a glucometer at home Pt also needs a neb machine, she has her albuterol sol as home but no machine, sent to [facility] as well. There were several other changes made by the hospital, that need to be gone over tomorrow at her appt with Dr. Livia Snellen.  Notes from [facility] have been printed out for her appt Glucometer has been sent to the pharmacy

## 2021-06-27 ENCOUNTER — Ambulatory Visit: Payer: Medicare HMO | Admitting: Family Medicine

## 2021-06-27 DIAGNOSIS — N183 Chronic kidney disease, stage 3 unspecified: Secondary | ICD-10-CM | POA: Diagnosis not present

## 2021-06-27 DIAGNOSIS — I130 Hypertensive heart and chronic kidney disease with heart failure and stage 1 through stage 4 chronic kidney disease, or unspecified chronic kidney disease: Secondary | ICD-10-CM | POA: Diagnosis not present

## 2021-06-27 DIAGNOSIS — I4821 Permanent atrial fibrillation: Secondary | ICD-10-CM | POA: Diagnosis not present

## 2021-06-27 DIAGNOSIS — T83511D Infection and inflammatory reaction due to indwelling urethral catheter, subsequent encounter: Secondary | ICD-10-CM | POA: Diagnosis not present

## 2021-06-27 DIAGNOSIS — L89152 Pressure ulcer of sacral region, stage 2: Secondary | ICD-10-CM | POA: Diagnosis not present

## 2021-06-27 DIAGNOSIS — E1122 Type 2 diabetes mellitus with diabetic chronic kidney disease: Secondary | ICD-10-CM | POA: Diagnosis not present

## 2021-06-27 DIAGNOSIS — N390 Urinary tract infection, site not specified: Secondary | ICD-10-CM | POA: Diagnosis not present

## 2021-06-27 DIAGNOSIS — I5032 Chronic diastolic (congestive) heart failure: Secondary | ICD-10-CM | POA: Diagnosis not present

## 2021-06-27 DIAGNOSIS — L89312 Pressure ulcer of right buttock, stage 2: Secondary | ICD-10-CM | POA: Diagnosis not present

## 2021-06-27 NOTE — Telephone Encounter (Signed)
Video is fine

## 2021-06-27 NOTE — Telephone Encounter (Signed)
Advised daughter ok to change to video visit. She requests change to different day. Appointment rescheduled.

## 2021-06-28 DIAGNOSIS — I4821 Permanent atrial fibrillation: Secondary | ICD-10-CM | POA: Diagnosis not present

## 2021-06-28 DIAGNOSIS — I5032 Chronic diastolic (congestive) heart failure: Secondary | ICD-10-CM | POA: Diagnosis not present

## 2021-06-28 DIAGNOSIS — L89152 Pressure ulcer of sacral region, stage 2: Secondary | ICD-10-CM | POA: Diagnosis not present

## 2021-06-28 DIAGNOSIS — T83511D Infection and inflammatory reaction due to indwelling urethral catheter, subsequent encounter: Secondary | ICD-10-CM | POA: Diagnosis not present

## 2021-06-28 DIAGNOSIS — E1122 Type 2 diabetes mellitus with diabetic chronic kidney disease: Secondary | ICD-10-CM | POA: Diagnosis not present

## 2021-06-28 DIAGNOSIS — N390 Urinary tract infection, site not specified: Secondary | ICD-10-CM | POA: Diagnosis not present

## 2021-06-28 DIAGNOSIS — L89312 Pressure ulcer of right buttock, stage 2: Secondary | ICD-10-CM | POA: Diagnosis not present

## 2021-06-28 DIAGNOSIS — I130 Hypertensive heart and chronic kidney disease with heart failure and stage 1 through stage 4 chronic kidney disease, or unspecified chronic kidney disease: Secondary | ICD-10-CM | POA: Diagnosis not present

## 2021-06-28 DIAGNOSIS — N183 Chronic kidney disease, stage 3 unspecified: Secondary | ICD-10-CM | POA: Diagnosis not present

## 2021-06-29 DIAGNOSIS — I2510 Atherosclerotic heart disease of native coronary artery without angina pectoris: Secondary | ICD-10-CM | POA: Diagnosis not present

## 2021-06-29 DIAGNOSIS — Z885 Allergy status to narcotic agent status: Secondary | ICD-10-CM | POA: Diagnosis not present

## 2021-06-29 DIAGNOSIS — J9811 Atelectasis: Secondary | ICD-10-CM | POA: Diagnosis not present

## 2021-06-29 DIAGNOSIS — I7789 Other specified disorders of arteries and arterioles: Secondary | ICD-10-CM | POA: Diagnosis not present

## 2021-06-29 DIAGNOSIS — R279 Unspecified lack of coordination: Secondary | ICD-10-CM | POA: Diagnosis not present

## 2021-06-29 DIAGNOSIS — I110 Hypertensive heart disease with heart failure: Secondary | ICD-10-CM | POA: Diagnosis not present

## 2021-06-29 DIAGNOSIS — K6289 Other specified diseases of anus and rectum: Secondary | ICD-10-CM | POA: Diagnosis not present

## 2021-06-29 DIAGNOSIS — K4090 Unilateral inguinal hernia, without obstruction or gangrene, not specified as recurrent: Secondary | ICD-10-CM | POA: Diagnosis not present

## 2021-06-29 DIAGNOSIS — K529 Noninfective gastroenteritis and colitis, unspecified: Secondary | ICD-10-CM | POA: Diagnosis not present

## 2021-06-29 DIAGNOSIS — R197 Diarrhea, unspecified: Secondary | ICD-10-CM | POA: Diagnosis not present

## 2021-06-29 DIAGNOSIS — Z7401 Bed confinement status: Secondary | ICD-10-CM | POA: Diagnosis not present

## 2021-06-29 DIAGNOSIS — Z20822 Contact with and (suspected) exposure to covid-19: Secondary | ICD-10-CM | POA: Diagnosis not present

## 2021-06-29 DIAGNOSIS — N390 Urinary tract infection, site not specified: Secondary | ICD-10-CM | POA: Diagnosis not present

## 2021-06-29 DIAGNOSIS — J90 Pleural effusion, not elsewhere classified: Secondary | ICD-10-CM | POA: Diagnosis not present

## 2021-06-29 DIAGNOSIS — R531 Weakness: Secondary | ICD-10-CM | POA: Diagnosis not present

## 2021-06-29 DIAGNOSIS — R5381 Other malaise: Secondary | ICD-10-CM | POA: Diagnosis not present

## 2021-06-29 DIAGNOSIS — I509 Heart failure, unspecified: Secondary | ICD-10-CM | POA: Diagnosis not present

## 2021-07-01 DIAGNOSIS — I130 Hypertensive heart and chronic kidney disease with heart failure and stage 1 through stage 4 chronic kidney disease, or unspecified chronic kidney disease: Secondary | ICD-10-CM | POA: Diagnosis not present

## 2021-07-01 DIAGNOSIS — N183 Chronic kidney disease, stage 3 unspecified: Secondary | ICD-10-CM | POA: Diagnosis not present

## 2021-07-01 DIAGNOSIS — T83511D Infection and inflammatory reaction due to indwelling urethral catheter, subsequent encounter: Secondary | ICD-10-CM | POA: Diagnosis not present

## 2021-07-01 DIAGNOSIS — L89152 Pressure ulcer of sacral region, stage 2: Secondary | ICD-10-CM | POA: Diagnosis not present

## 2021-07-01 DIAGNOSIS — E1122 Type 2 diabetes mellitus with diabetic chronic kidney disease: Secondary | ICD-10-CM | POA: Diagnosis not present

## 2021-07-01 DIAGNOSIS — I5032 Chronic diastolic (congestive) heart failure: Secondary | ICD-10-CM | POA: Diagnosis not present

## 2021-07-01 DIAGNOSIS — I4821 Permanent atrial fibrillation: Secondary | ICD-10-CM | POA: Diagnosis not present

## 2021-07-01 DIAGNOSIS — L89312 Pressure ulcer of right buttock, stage 2: Secondary | ICD-10-CM | POA: Diagnosis not present

## 2021-07-01 DIAGNOSIS — N390 Urinary tract infection, site not specified: Secondary | ICD-10-CM | POA: Diagnosis not present

## 2021-07-02 DIAGNOSIS — T83511D Infection and inflammatory reaction due to indwelling urethral catheter, subsequent encounter: Secondary | ICD-10-CM | POA: Diagnosis not present

## 2021-07-02 DIAGNOSIS — I4821 Permanent atrial fibrillation: Secondary | ICD-10-CM | POA: Diagnosis not present

## 2021-07-02 DIAGNOSIS — I5032 Chronic diastolic (congestive) heart failure: Secondary | ICD-10-CM | POA: Diagnosis not present

## 2021-07-02 DIAGNOSIS — I130 Hypertensive heart and chronic kidney disease with heart failure and stage 1 through stage 4 chronic kidney disease, or unspecified chronic kidney disease: Secondary | ICD-10-CM | POA: Diagnosis not present

## 2021-07-02 DIAGNOSIS — N183 Chronic kidney disease, stage 3 unspecified: Secondary | ICD-10-CM | POA: Diagnosis not present

## 2021-07-02 DIAGNOSIS — L89312 Pressure ulcer of right buttock, stage 2: Secondary | ICD-10-CM | POA: Diagnosis not present

## 2021-07-02 DIAGNOSIS — L89152 Pressure ulcer of sacral region, stage 2: Secondary | ICD-10-CM | POA: Diagnosis not present

## 2021-07-02 DIAGNOSIS — N390 Urinary tract infection, site not specified: Secondary | ICD-10-CM | POA: Diagnosis not present

## 2021-07-02 DIAGNOSIS — E1122 Type 2 diabetes mellitus with diabetic chronic kidney disease: Secondary | ICD-10-CM | POA: Diagnosis not present

## 2021-07-04 ENCOUNTER — Telehealth: Payer: Medicare HMO | Admitting: Family Medicine

## 2021-07-04 DIAGNOSIS — L89152 Pressure ulcer of sacral region, stage 2: Secondary | ICD-10-CM | POA: Diagnosis not present

## 2021-07-04 DIAGNOSIS — I130 Hypertensive heart and chronic kidney disease with heart failure and stage 1 through stage 4 chronic kidney disease, or unspecified chronic kidney disease: Secondary | ICD-10-CM | POA: Diagnosis not present

## 2021-07-04 DIAGNOSIS — I4821 Permanent atrial fibrillation: Secondary | ICD-10-CM | POA: Diagnosis not present

## 2021-07-04 DIAGNOSIS — L89312 Pressure ulcer of right buttock, stage 2: Secondary | ICD-10-CM | POA: Diagnosis not present

## 2021-07-04 DIAGNOSIS — I5032 Chronic diastolic (congestive) heart failure: Secondary | ICD-10-CM | POA: Diagnosis not present

## 2021-07-04 DIAGNOSIS — N390 Urinary tract infection, site not specified: Secondary | ICD-10-CM | POA: Diagnosis not present

## 2021-07-04 DIAGNOSIS — N183 Chronic kidney disease, stage 3 unspecified: Secondary | ICD-10-CM | POA: Diagnosis not present

## 2021-07-04 DIAGNOSIS — T83511D Infection and inflammatory reaction due to indwelling urethral catheter, subsequent encounter: Secondary | ICD-10-CM | POA: Diagnosis not present

## 2021-07-04 DIAGNOSIS — E1122 Type 2 diabetes mellitus with diabetic chronic kidney disease: Secondary | ICD-10-CM | POA: Diagnosis not present

## 2021-07-05 ENCOUNTER — Ambulatory Visit (INDEPENDENT_AMBULATORY_CARE_PROVIDER_SITE_OTHER): Payer: Medicare HMO

## 2021-07-05 ENCOUNTER — Other Ambulatory Visit: Payer: Self-pay

## 2021-07-05 DIAGNOSIS — L89152 Pressure ulcer of sacral region, stage 2: Secondary | ICD-10-CM | POA: Diagnosis not present

## 2021-07-05 DIAGNOSIS — I130 Hypertensive heart and chronic kidney disease with heart failure and stage 1 through stage 4 chronic kidney disease, or unspecified chronic kidney disease: Secondary | ICD-10-CM | POA: Diagnosis not present

## 2021-07-05 DIAGNOSIS — E785 Hyperlipidemia, unspecified: Secondary | ICD-10-CM

## 2021-07-05 DIAGNOSIS — N390 Urinary tract infection, site not specified: Secondary | ICD-10-CM

## 2021-07-05 DIAGNOSIS — E079 Disorder of thyroid, unspecified: Secondary | ICD-10-CM

## 2021-07-05 DIAGNOSIS — Z7401 Bed confinement status: Secondary | ICD-10-CM

## 2021-07-05 DIAGNOSIS — T83511D Infection and inflammatory reaction due to indwelling urethral catheter, subsequent encounter: Secondary | ICD-10-CM

## 2021-07-05 DIAGNOSIS — I5032 Chronic diastolic (congestive) heart failure: Secondary | ICD-10-CM | POA: Diagnosis not present

## 2021-07-05 DIAGNOSIS — J449 Chronic obstructive pulmonary disease, unspecified: Secondary | ICD-10-CM

## 2021-07-05 DIAGNOSIS — I4821 Permanent atrial fibrillation: Secondary | ICD-10-CM

## 2021-07-05 DIAGNOSIS — N183 Chronic kidney disease, stage 3 unspecified: Secondary | ICD-10-CM

## 2021-07-05 DIAGNOSIS — E1122 Type 2 diabetes mellitus with diabetic chronic kidney disease: Secondary | ICD-10-CM | POA: Diagnosis not present

## 2021-07-05 DIAGNOSIS — J9611 Chronic respiratory failure with hypoxia: Secondary | ICD-10-CM

## 2021-07-05 DIAGNOSIS — L89312 Pressure ulcer of right buttock, stage 2: Secondary | ICD-10-CM | POA: Diagnosis not present

## 2021-07-05 DIAGNOSIS — Z7901 Long term (current) use of anticoagulants: Secondary | ICD-10-CM

## 2021-07-05 DIAGNOSIS — E871 Hypo-osmolality and hyponatremia: Secondary | ICD-10-CM

## 2021-07-06 DIAGNOSIS — I5033 Acute on chronic diastolic (congestive) heart failure: Secondary | ICD-10-CM | POA: Diagnosis not present

## 2021-07-08 DIAGNOSIS — T83511D Infection and inflammatory reaction due to indwelling urethral catheter, subsequent encounter: Secondary | ICD-10-CM | POA: Diagnosis not present

## 2021-07-08 DIAGNOSIS — L89312 Pressure ulcer of right buttock, stage 2: Secondary | ICD-10-CM | POA: Diagnosis not present

## 2021-07-08 DIAGNOSIS — N183 Chronic kidney disease, stage 3 unspecified: Secondary | ICD-10-CM | POA: Diagnosis not present

## 2021-07-08 DIAGNOSIS — I4821 Permanent atrial fibrillation: Secondary | ICD-10-CM | POA: Diagnosis not present

## 2021-07-08 DIAGNOSIS — E1122 Type 2 diabetes mellitus with diabetic chronic kidney disease: Secondary | ICD-10-CM | POA: Diagnosis not present

## 2021-07-08 DIAGNOSIS — L89152 Pressure ulcer of sacral region, stage 2: Secondary | ICD-10-CM | POA: Diagnosis not present

## 2021-07-08 DIAGNOSIS — I130 Hypertensive heart and chronic kidney disease with heart failure and stage 1 through stage 4 chronic kidney disease, or unspecified chronic kidney disease: Secondary | ICD-10-CM | POA: Diagnosis not present

## 2021-07-08 DIAGNOSIS — N390 Urinary tract infection, site not specified: Secondary | ICD-10-CM | POA: Diagnosis not present

## 2021-07-08 DIAGNOSIS — I5032 Chronic diastolic (congestive) heart failure: Secondary | ICD-10-CM | POA: Diagnosis not present

## 2021-07-10 DIAGNOSIS — I130 Hypertensive heart and chronic kidney disease with heart failure and stage 1 through stage 4 chronic kidney disease, or unspecified chronic kidney disease: Secondary | ICD-10-CM | POA: Diagnosis not present

## 2021-07-10 DIAGNOSIS — N390 Urinary tract infection, site not specified: Secondary | ICD-10-CM | POA: Diagnosis not present

## 2021-07-10 DIAGNOSIS — L89152 Pressure ulcer of sacral region, stage 2: Secondary | ICD-10-CM | POA: Diagnosis not present

## 2021-07-10 DIAGNOSIS — N183 Chronic kidney disease, stage 3 unspecified: Secondary | ICD-10-CM | POA: Diagnosis not present

## 2021-07-10 DIAGNOSIS — I4821 Permanent atrial fibrillation: Secondary | ICD-10-CM | POA: Diagnosis not present

## 2021-07-10 DIAGNOSIS — T83511D Infection and inflammatory reaction due to indwelling urethral catheter, subsequent encounter: Secondary | ICD-10-CM | POA: Diagnosis not present

## 2021-07-10 DIAGNOSIS — L89312 Pressure ulcer of right buttock, stage 2: Secondary | ICD-10-CM | POA: Diagnosis not present

## 2021-07-10 DIAGNOSIS — I5032 Chronic diastolic (congestive) heart failure: Secondary | ICD-10-CM | POA: Diagnosis not present

## 2021-07-10 DIAGNOSIS — E1122 Type 2 diabetes mellitus with diabetic chronic kidney disease: Secondary | ICD-10-CM | POA: Diagnosis not present

## 2021-07-11 ENCOUNTER — Telehealth: Payer: Self-pay | Admitting: Family Medicine

## 2021-07-11 DIAGNOSIS — E1122 Type 2 diabetes mellitus with diabetic chronic kidney disease: Secondary | ICD-10-CM | POA: Diagnosis not present

## 2021-07-11 DIAGNOSIS — L89312 Pressure ulcer of right buttock, stage 2: Secondary | ICD-10-CM | POA: Diagnosis not present

## 2021-07-11 DIAGNOSIS — I4821 Permanent atrial fibrillation: Secondary | ICD-10-CM | POA: Diagnosis not present

## 2021-07-11 DIAGNOSIS — T83511D Infection and inflammatory reaction due to indwelling urethral catheter, subsequent encounter: Secondary | ICD-10-CM | POA: Diagnosis not present

## 2021-07-11 DIAGNOSIS — I130 Hypertensive heart and chronic kidney disease with heart failure and stage 1 through stage 4 chronic kidney disease, or unspecified chronic kidney disease: Secondary | ICD-10-CM | POA: Diagnosis not present

## 2021-07-11 DIAGNOSIS — N183 Chronic kidney disease, stage 3 unspecified: Secondary | ICD-10-CM | POA: Diagnosis not present

## 2021-07-11 DIAGNOSIS — N390 Urinary tract infection, site not specified: Secondary | ICD-10-CM | POA: Diagnosis not present

## 2021-07-11 DIAGNOSIS — L89152 Pressure ulcer of sacral region, stage 2: Secondary | ICD-10-CM | POA: Diagnosis not present

## 2021-07-11 DIAGNOSIS — I5032 Chronic diastolic (congestive) heart failure: Secondary | ICD-10-CM | POA: Diagnosis not present

## 2021-07-11 NOTE — Telephone Encounter (Signed)
APPOINTMENT IS NEEDED FOR THIS

## 2021-07-12 DIAGNOSIS — L89159 Pressure ulcer of sacral region, unspecified stage: Secondary | ICD-10-CM | POA: Diagnosis not present

## 2021-07-12 DIAGNOSIS — R531 Weakness: Secondary | ICD-10-CM | POA: Diagnosis not present

## 2021-07-12 DIAGNOSIS — L89309 Pressure ulcer of unspecified buttock, unspecified stage: Secondary | ICD-10-CM | POA: Diagnosis not present

## 2021-07-12 DIAGNOSIS — R5383 Other fatigue: Secondary | ICD-10-CM | POA: Diagnosis not present

## 2021-07-12 DIAGNOSIS — Z20822 Contact with and (suspected) exposure to covid-19: Secondary | ICD-10-CM | POA: Diagnosis not present

## 2021-07-12 DIAGNOSIS — N281 Cyst of kidney, acquired: Secondary | ICD-10-CM | POA: Diagnosis not present

## 2021-07-12 DIAGNOSIS — K4090 Unilateral inguinal hernia, without obstruction or gangrene, not specified as recurrent: Secondary | ICD-10-CM | POA: Diagnosis not present

## 2021-07-12 DIAGNOSIS — N200 Calculus of kidney: Secondary | ICD-10-CM | POA: Diagnosis not present

## 2021-07-12 DIAGNOSIS — R82998 Other abnormal findings in urine: Secondary | ICD-10-CM | POA: Diagnosis not present

## 2021-07-12 DIAGNOSIS — N3001 Acute cystitis with hematuria: Secondary | ICD-10-CM | POA: Diagnosis not present

## 2021-07-12 DIAGNOSIS — R109 Unspecified abdominal pain: Secondary | ICD-10-CM | POA: Diagnosis not present

## 2021-07-12 DIAGNOSIS — R112 Nausea with vomiting, unspecified: Secondary | ICD-10-CM | POA: Diagnosis not present

## 2021-07-12 DIAGNOSIS — B9689 Other specified bacterial agents as the cause of diseases classified elsewhere: Secondary | ICD-10-CM | POA: Diagnosis not present

## 2021-07-12 DIAGNOSIS — L98491 Non-pressure chronic ulcer of skin of other sites limited to breakdown of skin: Secondary | ICD-10-CM | POA: Diagnosis not present

## 2021-07-12 DIAGNOSIS — Z872 Personal history of diseases of the skin and subcutaneous tissue: Secondary | ICD-10-CM | POA: Diagnosis not present

## 2021-07-12 DIAGNOSIS — Z7401 Bed confinement status: Secondary | ICD-10-CM | POA: Diagnosis not present

## 2021-07-12 DIAGNOSIS — N202 Calculus of kidney with calculus of ureter: Secondary | ICD-10-CM | POA: Diagnosis not present

## 2021-07-12 DIAGNOSIS — R5381 Other malaise: Secondary | ICD-10-CM | POA: Diagnosis not present

## 2021-07-12 NOTE — Telephone Encounter (Signed)
Lmtcb to schedule an apt 

## 2021-07-13 DIAGNOSIS — Z7401 Bed confinement status: Secondary | ICD-10-CM | POA: Diagnosis not present

## 2021-07-13 DIAGNOSIS — I959 Hypotension, unspecified: Secondary | ICD-10-CM | POA: Diagnosis not present

## 2021-07-15 DIAGNOSIS — N183 Chronic kidney disease, stage 3 unspecified: Secondary | ICD-10-CM | POA: Diagnosis not present

## 2021-07-15 DIAGNOSIS — L89152 Pressure ulcer of sacral region, stage 2: Secondary | ICD-10-CM | POA: Diagnosis not present

## 2021-07-15 DIAGNOSIS — E1122 Type 2 diabetes mellitus with diabetic chronic kidney disease: Secondary | ICD-10-CM | POA: Diagnosis not present

## 2021-07-15 DIAGNOSIS — L89312 Pressure ulcer of right buttock, stage 2: Secondary | ICD-10-CM | POA: Diagnosis not present

## 2021-07-15 DIAGNOSIS — T83511D Infection and inflammatory reaction due to indwelling urethral catheter, subsequent encounter: Secondary | ICD-10-CM | POA: Diagnosis not present

## 2021-07-15 DIAGNOSIS — I5032 Chronic diastolic (congestive) heart failure: Secondary | ICD-10-CM | POA: Diagnosis not present

## 2021-07-15 DIAGNOSIS — I4821 Permanent atrial fibrillation: Secondary | ICD-10-CM | POA: Diagnosis not present

## 2021-07-15 DIAGNOSIS — N390 Urinary tract infection, site not specified: Secondary | ICD-10-CM | POA: Diagnosis not present

## 2021-07-15 DIAGNOSIS — I130 Hypertensive heart and chronic kidney disease with heart failure and stage 1 through stage 4 chronic kidney disease, or unspecified chronic kidney disease: Secondary | ICD-10-CM | POA: Diagnosis not present

## 2021-07-17 DIAGNOSIS — I130 Hypertensive heart and chronic kidney disease with heart failure and stage 1 through stage 4 chronic kidney disease, or unspecified chronic kidney disease: Secondary | ICD-10-CM | POA: Diagnosis not present

## 2021-07-17 DIAGNOSIS — E1122 Type 2 diabetes mellitus with diabetic chronic kidney disease: Secondary | ICD-10-CM | POA: Diagnosis not present

## 2021-07-17 DIAGNOSIS — N390 Urinary tract infection, site not specified: Secondary | ICD-10-CM | POA: Diagnosis not present

## 2021-07-17 DIAGNOSIS — N183 Chronic kidney disease, stage 3 unspecified: Secondary | ICD-10-CM | POA: Diagnosis not present

## 2021-07-17 DIAGNOSIS — I5032 Chronic diastolic (congestive) heart failure: Secondary | ICD-10-CM | POA: Diagnosis not present

## 2021-07-17 DIAGNOSIS — L89312 Pressure ulcer of right buttock, stage 2: Secondary | ICD-10-CM | POA: Diagnosis not present

## 2021-07-17 DIAGNOSIS — I4821 Permanent atrial fibrillation: Secondary | ICD-10-CM | POA: Diagnosis not present

## 2021-07-17 DIAGNOSIS — T83511D Infection and inflammatory reaction due to indwelling urethral catheter, subsequent encounter: Secondary | ICD-10-CM | POA: Diagnosis not present

## 2021-07-17 DIAGNOSIS — L89152 Pressure ulcer of sacral region, stage 2: Secondary | ICD-10-CM | POA: Diagnosis not present

## 2021-07-19 ENCOUNTER — Ambulatory Visit (INDEPENDENT_AMBULATORY_CARE_PROVIDER_SITE_OTHER): Payer: Medicare HMO | Admitting: Family Medicine

## 2021-07-19 ENCOUNTER — Ambulatory Visit: Payer: Medicare HMO | Admitting: Family Medicine

## 2021-07-19 DIAGNOSIS — L89312 Pressure ulcer of right buttock, stage 2: Secondary | ICD-10-CM | POA: Diagnosis not present

## 2021-07-19 DIAGNOSIS — I4821 Permanent atrial fibrillation: Secondary | ICD-10-CM | POA: Diagnosis not present

## 2021-07-19 DIAGNOSIS — N390 Urinary tract infection, site not specified: Secondary | ICD-10-CM | POA: Diagnosis not present

## 2021-07-19 DIAGNOSIS — N183 Chronic kidney disease, stage 3 unspecified: Secondary | ICD-10-CM | POA: Diagnosis not present

## 2021-07-19 DIAGNOSIS — L89152 Pressure ulcer of sacral region, stage 2: Secondary | ICD-10-CM | POA: Diagnosis not present

## 2021-07-19 DIAGNOSIS — I5022 Chronic systolic (congestive) heart failure: Secondary | ICD-10-CM

## 2021-07-19 DIAGNOSIS — E1122 Type 2 diabetes mellitus with diabetic chronic kidney disease: Secondary | ICD-10-CM | POA: Diagnosis not present

## 2021-07-19 DIAGNOSIS — I5032 Chronic diastolic (congestive) heart failure: Secondary | ICD-10-CM | POA: Diagnosis not present

## 2021-07-19 DIAGNOSIS — T83511D Infection and inflammatory reaction due to indwelling urethral catheter, subsequent encounter: Secondary | ICD-10-CM | POA: Diagnosis not present

## 2021-07-19 DIAGNOSIS — I130 Hypertensive heart and chronic kidney disease with heart failure and stage 1 through stage 4 chronic kidney disease, or unspecified chronic kidney disease: Secondary | ICD-10-CM | POA: Diagnosis not present

## 2021-07-19 MED ORDER — RIVASTIGMINE 4.6 MG/24HR TD PT24: 4.6000 mg | MEDICATED_PATCH | Freq: Every day | TRANSDERMAL | 12 refills | Status: AC

## 2021-07-19 MED ORDER — TORSEMIDE 20 MG PO TABS: 20.0000 mg | ORAL_TABLET | Freq: Every day | ORAL | 6 refills | Status: AC

## 2021-07-19 MED ORDER — GABAPENTIN 300 MG PO CAPS: 300.0000 mg | ORAL_CAPSULE | Freq: Every day | ORAL | 0 refills | Status: AC

## 2021-07-19 NOTE — Progress Notes (Addendum)
Subjective:    Patient ID: DACODA SPALLONE, female    DOB: 03-Mar-1946, 75 y.o.   MRN: [id number]   HPI: [name] is a 75 y.o. female presenting for [facility] 2 completion. Having PT twice a week. Wants to walk, but home PT is only coming twice a week for thirty minutes. Would like more intensive 21 day program at [facility]. Bedridden since June, 2021. Glucose running around 200.   History given by daughter who states she is alert, with intermittent confusion, repetitive. Requests dementia patch be reinstated.  Requires red rubber catheter to urinate. Wants to have trial of bedside commode. Intermittent fecal incontinence. Feeds self. Regular diet.   Skin raw at lesion on leg. Sacral lesion - skin is  beginning to peel away. Needs home healh to monitor. Needs home health to help with urinary catheter care.      Depression screen [facility] 2/9 03/22/2020 03/22/2020 06/30/2019 01/03/2019 09/08/2018  Decreased Interest 2 2 0 0 0  Down, Depressed, Hopeless 1 1 0 0 0  PHQ - 2 Score 3 3 0 0 0  Altered sleeping 3 - - - -  Tired, decreased energy 3 - - - -  Change in appetite 1 - - - -  Feeling bad or failure about yourself  0 - - - -  Trouble concentrating 1 - - - -  Moving slowly or fidgety/restless 0 - - - -  Suicidal thoughts 0 - - - -  PHQ-9 Score 11 - - - -  Difficult doing work/chores Somewhat difficult - - - -  Some recent data might be hidden     Relevant past medical, surgical, family and social history reviewed and updated as indicated.  Interim medical history since our last visit reviewed. Allergies and medications reviewed and updated.  ROS:  Review of Systems  Constitutional:  Positive for appetite change (getting better). Negative for fever.  HENT: Negative.    Respiratory:  Negative for shortness of breath.   Gastrointestinal:  Positive for abdominal pain (when constipation) and constipation. Negative for diarrhea, nausea and vomiting.  Musculoskeletal:  Positive  for gait problem (bedridden).  Skin:  Positive for wound (pressure sore at sacrum.).    Social History   Tobacco Use  Smoking Status Never  Smokeless Tobacco Never       Objective:     Wt Readings from Last 3 Encounters:  05/30/20 279 lb 15.8 oz (127 kg)  05/18/20 280 lb (127 kg)  03/22/20 274 lb 2 oz (124.3 kg)     Exam deferred. Pt. Harboring due to COVID 19. Phone visit performed.   Assessment & Plan:   1. Chronic systolic congestive heart failure (HCC)     Meds ordered this encounter  Medications   gabapentin (NEURONTIN) 300 MG capsule    Sig: Take 1 capsule (300 mg total) by mouth at bedtime. (NEEDS TO BE SEEN BEFORE NEXT REFILL)    Dispense:  30 capsule    Refill:  0    This prescription was filled on 03/08/2021. Any refills authorized will be placed on file.   torsemide (DEMADEX) 20 MG tablet    Sig: Take 1 tablet (20 mg total) by mouth daily.    Dispense:  30 tablet    Refill:  6   rivastigmine (EXELON) 4.6 mg/24hr    Sig: Place 1 patch (4.6 mg total) onto the skin daily.    Dispense:  30 patch    Refill:  12  No orders of the defined types were placed in this encounter.     Diagnoses and all orders for this visit:  Chronic systolic congestive heart failure (HCC) -     torsemide (DEMADEX) 20 MG tablet; Take 1 tablet (20 mg total) by mouth daily.  Other orders -     gabapentin (NEURONTIN) 300 MG capsule; Take 1 capsule (300 mg total) by mouth at bedtime. (NEEDS TO BE SEEN BEFORE NEXT REFILL) -     rivastigmine (EXELON) 4.6 mg/24hr; Place 1 patch (4.6 mg total) onto the skin daily.   Virtual Visit via telephone Note  I discussed the limitations, risks, security and privacy concerns of performing an evaluation and management service by telephone and the availability of in person appointments. The patient was identified with two identifiers. Pt.expressed understanding and agreed to proceed. Pt. Is at home. Dr. Livia Snellen is in his office.  Follow Up  Instructions:   I discussed the assessment and treatment plan with the patient. The patient was provided an opportunity to ask questions and all were answered. The patient agreed with the plan and demonstrated an understanding of the instructions.   The patient was advised to call back or seek an in-person evaluation if the symptoms worsen or if the condition fails to improve as anticipated.   Total minutes including cphone contact time: 29 FL2 to be faxed to [facility] when complete.  Follow up plan: Return if symptoms worsen or fail to improve.  Claretta Fraise, MD [place]

## 2021-07-22 DIAGNOSIS — I5033 Acute on chronic diastolic (congestive) heart failure: Secondary | ICD-10-CM | POA: Diagnosis not present

## 2021-07-22 DIAGNOSIS — R2689 Other abnormalities of gait and mobility: Secondary | ICD-10-CM | POA: Diagnosis not present

## 2021-07-22 DIAGNOSIS — M6281 Muscle weakness (generalized): Secondary | ICD-10-CM | POA: Diagnosis not present

## 2021-07-23 ENCOUNTER — Encounter: Payer: Self-pay | Admitting: Family Medicine

## 2021-07-24 DIAGNOSIS — N390 Urinary tract infection, site not specified: Secondary | ICD-10-CM | POA: Diagnosis not present

## 2021-07-24 DIAGNOSIS — E1122 Type 2 diabetes mellitus with diabetic chronic kidney disease: Secondary | ICD-10-CM | POA: Diagnosis not present

## 2021-07-24 DIAGNOSIS — I130 Hypertensive heart and chronic kidney disease with heart failure and stage 1 through stage 4 chronic kidney disease, or unspecified chronic kidney disease: Secondary | ICD-10-CM | POA: Diagnosis not present

## 2021-07-24 DIAGNOSIS — T83511D Infection and inflammatory reaction due to indwelling urethral catheter, subsequent encounter: Secondary | ICD-10-CM | POA: Diagnosis not present

## 2021-07-24 DIAGNOSIS — I5032 Chronic diastolic (congestive) heart failure: Secondary | ICD-10-CM | POA: Diagnosis not present

## 2021-07-24 DIAGNOSIS — N183 Chronic kidney disease, stage 3 unspecified: Secondary | ICD-10-CM | POA: Diagnosis not present

## 2021-07-24 DIAGNOSIS — I4821 Permanent atrial fibrillation: Secondary | ICD-10-CM | POA: Diagnosis not present

## 2021-07-24 DIAGNOSIS — L89312 Pressure ulcer of right buttock, stage 2: Secondary | ICD-10-CM | POA: Diagnosis not present

## 2021-07-24 DIAGNOSIS — L89152 Pressure ulcer of sacral region, stage 2: Secondary | ICD-10-CM | POA: Diagnosis not present

## 2021-07-31 DIAGNOSIS — L89152 Pressure ulcer of sacral region, stage 2: Secondary | ICD-10-CM | POA: Diagnosis not present

## 2021-07-31 DIAGNOSIS — L89312 Pressure ulcer of right buttock, stage 2: Secondary | ICD-10-CM | POA: Diagnosis not present

## 2021-07-31 DIAGNOSIS — T83511D Infection and inflammatory reaction due to indwelling urethral catheter, subsequent encounter: Secondary | ICD-10-CM | POA: Diagnosis not present

## 2021-07-31 DIAGNOSIS — I4821 Permanent atrial fibrillation: Secondary | ICD-10-CM | POA: Diagnosis not present

## 2021-07-31 DIAGNOSIS — E1122 Type 2 diabetes mellitus with diabetic chronic kidney disease: Secondary | ICD-10-CM | POA: Diagnosis not present

## 2021-07-31 DIAGNOSIS — I5032 Chronic diastolic (congestive) heart failure: Secondary | ICD-10-CM | POA: Diagnosis not present

## 2021-07-31 DIAGNOSIS — N183 Chronic kidney disease, stage 3 unspecified: Secondary | ICD-10-CM | POA: Diagnosis not present

## 2021-07-31 DIAGNOSIS — N390 Urinary tract infection, site not specified: Secondary | ICD-10-CM | POA: Diagnosis not present

## 2021-07-31 DIAGNOSIS — I130 Hypertensive heart and chronic kidney disease with heart failure and stage 1 through stage 4 chronic kidney disease, or unspecified chronic kidney disease: Secondary | ICD-10-CM | POA: Diagnosis not present

## 2021-08-01 ENCOUNTER — Telehealth: Payer: Self-pay | Admitting: *Deleted

## 2021-08-01 ENCOUNTER — Other Ambulatory Visit (HOSPITAL_COMMUNITY): Payer: Self-pay

## 2021-08-01 DIAGNOSIS — I482 Chronic atrial fibrillation, unspecified: Secondary | ICD-10-CM

## 2021-08-01 DIAGNOSIS — I5022 Chronic systolic (congestive) heart failure: Secondary | ICD-10-CM

## 2021-08-01 MED ORDER — MIDODRINE HCL 10 MG PO TABS: ORAL_TABLET | ORAL | 0 refills | Status: AC

## 2021-08-01 NOTE — Telephone Encounter (Signed)
FYI:  VM from [place] [facility] Saw pt for last time yesterday, she has been discharged from [place] changed her foley cath at visit, it is good for a month. They can not keep her on services just for cath changes. Pt will need to be referred to an agency that does cath changes or cath needs to be removed.

## 2021-08-01 NOTE — Telephone Encounter (Signed)
I do not know of an gaency that performs that service.

## 2021-08-06 DIAGNOSIS — I5033 Acute on chronic diastolic (congestive) heart failure: Secondary | ICD-10-CM | POA: Diagnosis not present

## 2021-08-07 ENCOUNTER — Other Ambulatory Visit: Payer: Self-pay | Admitting: Family Medicine

## 2021-08-07 DIAGNOSIS — N1832 Chronic kidney disease, stage 3b: Secondary | ICD-10-CM

## 2021-08-07 DIAGNOSIS — E1121 Type 2 diabetes mellitus with diabetic nephropathy: Secondary | ICD-10-CM

## 2021-08-07 DIAGNOSIS — I270 Primary pulmonary hypertension: Secondary | ICD-10-CM

## 2021-08-07 DIAGNOSIS — E119 Type 2 diabetes mellitus without complications: Secondary | ICD-10-CM

## 2021-08-07 DIAGNOSIS — Z794 Long term (current) use of insulin: Secondary | ICD-10-CM

## 2021-08-07 DIAGNOSIS — I5022 Chronic systolic (congestive) heart failure: Secondary | ICD-10-CM

## 2021-08-07 DIAGNOSIS — I2510 Atherosclerotic heart disease of native coronary artery without angina pectoris: Secondary | ICD-10-CM

## 2021-08-07 NOTE — Telephone Encounter (Signed)
Please place order for [facility] for month catheter changes, I talked with Loma Sousa and she know of some that may do this

## 2021-08-07 NOTE — Telephone Encounter (Signed)
Referral placed, as requested WS 

## 2021-08-22 ENCOUNTER — Telehealth: Payer: Self-pay | Admitting: *Deleted

## 2021-08-22 DIAGNOSIS — I4891 Unspecified atrial fibrillation: Secondary | ICD-10-CM | POA: Diagnosis not present

## 2021-08-22 DIAGNOSIS — M6281 Muscle weakness (generalized): Secondary | ICD-10-CM | POA: Diagnosis not present

## 2021-08-22 DIAGNOSIS — R079 Chest pain, unspecified: Secondary | ICD-10-CM | POA: Diagnosis not present

## 2021-08-22 DIAGNOSIS — N183 Chronic kidney disease, stage 3 unspecified: Secondary | ICD-10-CM | POA: Diagnosis not present

## 2021-08-22 DIAGNOSIS — Z20822 Contact with and (suspected) exposure to covid-19: Secondary | ICD-10-CM | POA: Diagnosis not present

## 2021-08-22 DIAGNOSIS — R0902 Hypoxemia: Secondary | ICD-10-CM | POA: Diagnosis not present

## 2021-08-22 DIAGNOSIS — J069 Acute upper respiratory infection, unspecified: Secondary | ICD-10-CM | POA: Diagnosis not present

## 2021-08-22 DIAGNOSIS — J449 Chronic obstructive pulmonary disease, unspecified: Secondary | ICD-10-CM | POA: Diagnosis not present

## 2021-08-22 DIAGNOSIS — J90 Pleural effusion, not elsewhere classified: Secondary | ICD-10-CM | POA: Diagnosis not present

## 2021-08-22 DIAGNOSIS — I959 Hypotension, unspecified: Secondary | ICD-10-CM | POA: Diagnosis not present

## 2021-08-22 DIAGNOSIS — I517 Cardiomegaly: Secondary | ICD-10-CM | POA: Diagnosis not present

## 2021-08-22 DIAGNOSIS — J9811 Atelectasis: Secondary | ICD-10-CM | POA: Diagnosis not present

## 2021-08-22 DIAGNOSIS — R0602 Shortness of breath: Secondary | ICD-10-CM | POA: Diagnosis not present

## 2021-08-22 DIAGNOSIS — I509 Heart failure, unspecified: Secondary | ICD-10-CM | POA: Diagnosis not present

## 2021-08-22 DIAGNOSIS — I130 Hypertensive heart and chronic kidney disease with heart failure and stage 1 through stage 4 chronic kidney disease, or unspecified chronic kidney disease: Secondary | ICD-10-CM | POA: Diagnosis not present

## 2021-08-22 DIAGNOSIS — I5033 Acute on chronic diastolic (congestive) heart failure: Secondary | ICD-10-CM | POA: Diagnosis not present

## 2021-08-22 DIAGNOSIS — R2689 Other abnormalities of gait and mobility: Secondary | ICD-10-CM | POA: Diagnosis not present

## 2021-08-22 DIAGNOSIS — E1122 Type 2 diabetes mellitus with diabetic chronic kidney disease: Secondary | ICD-10-CM | POA: Diagnosis not present

## 2021-08-22 DIAGNOSIS — R457 State of emotional shock and stress, unspecified: Secondary | ICD-10-CM | POA: Diagnosis not present

## 2021-08-22 NOTE — Telephone Encounter (Signed)
[facility], wanting to know if you agree w/ palliative care if a referral can be placed.

## 2021-08-23 DIAGNOSIS — Z7401 Bed confinement status: Secondary | ICD-10-CM | POA: Diagnosis not present

## 2021-08-23 DIAGNOSIS — R531 Weakness: Secondary | ICD-10-CM | POA: Diagnosis not present

## 2021-08-28 ENCOUNTER — Other Ambulatory Visit: Payer: Self-pay | Admitting: Family Medicine

## 2021-08-28 DIAGNOSIS — N183 Chronic kidney disease, stage 3 unspecified: Secondary | ICD-10-CM

## 2021-08-28 DIAGNOSIS — I5022 Chronic systolic (congestive) heart failure: Secondary | ICD-10-CM

## 2021-08-28 DIAGNOSIS — F0280 Dementia in other diseases classified elsewhere without behavioral disturbance: Secondary | ICD-10-CM

## 2021-08-28 DIAGNOSIS — E1121 Type 2 diabetes mellitus with diabetic nephropathy: Secondary | ICD-10-CM

## 2021-08-30 DIAGNOSIS — L89152 Pressure ulcer of sacral region, stage 2: Secondary | ICD-10-CM | POA: Diagnosis not present

## 2021-08-30 DIAGNOSIS — N1832 Chronic kidney disease, stage 3b: Secondary | ICD-10-CM | POA: Diagnosis not present

## 2021-08-30 DIAGNOSIS — I130 Hypertensive heart and chronic kidney disease with heart failure and stage 1 through stage 4 chronic kidney disease, or unspecified chronic kidney disease: Secondary | ICD-10-CM | POA: Diagnosis not present

## 2021-08-30 DIAGNOSIS — E1122 Type 2 diabetes mellitus with diabetic chronic kidney disease: Secondary | ICD-10-CM | POA: Diagnosis not present

## 2021-08-30 DIAGNOSIS — I4891 Unspecified atrial fibrillation: Secondary | ICD-10-CM | POA: Diagnosis not present

## 2021-08-30 DIAGNOSIS — J449 Chronic obstructive pulmonary disease, unspecified: Secondary | ICD-10-CM | POA: Diagnosis not present

## 2021-08-30 DIAGNOSIS — E1151 Type 2 diabetes mellitus with diabetic peripheral angiopathy without gangrene: Secondary | ICD-10-CM | POA: Diagnosis not present

## 2021-08-30 DIAGNOSIS — J069 Acute upper respiratory infection, unspecified: Secondary | ICD-10-CM | POA: Diagnosis not present

## 2021-08-30 DIAGNOSIS — I5022 Chronic systolic (congestive) heart failure: Secondary | ICD-10-CM | POA: Diagnosis not present

## 2021-09-02 DIAGNOSIS — J449 Chronic obstructive pulmonary disease, unspecified: Secondary | ICD-10-CM | POA: Diagnosis not present

## 2021-09-02 DIAGNOSIS — Z515 Encounter for palliative care: Secondary | ICD-10-CM | POA: Diagnosis not present

## 2021-09-02 DIAGNOSIS — I5022 Chronic systolic (congestive) heart failure: Secondary | ICD-10-CM | POA: Diagnosis not present

## 2021-09-03 ENCOUNTER — Telehealth: Payer: Self-pay | Admitting: Family Medicine

## 2021-09-03 NOTE — Telephone Encounter (Signed)
Colletta Maryland called from [place] stating that she saw note from [place] regarding insurance not covering [facility] and Palliative Care.   Wanted to confirm that referral should be for Palliative Care which is different from Hospice care and insurance should pay for it.  Can contact Colletta Maryland through [place] or call her at [phone]

## 2021-09-05 DIAGNOSIS — I5033 Acute on chronic diastolic (congestive) heart failure: Secondary | ICD-10-CM | POA: Diagnosis not present

## 2021-09-06 ENCOUNTER — Telehealth: Payer: Self-pay | Admitting: Family Medicine

## 2021-09-06 DIAGNOSIS — J449 Chronic obstructive pulmonary disease, unspecified: Secondary | ICD-10-CM | POA: Diagnosis not present

## 2021-09-06 DIAGNOSIS — I130 Hypertensive heart and chronic kidney disease with heart failure and stage 1 through stage 4 chronic kidney disease, or unspecified chronic kidney disease: Secondary | ICD-10-CM | POA: Diagnosis not present

## 2021-09-06 DIAGNOSIS — I4891 Unspecified atrial fibrillation: Secondary | ICD-10-CM | POA: Diagnosis not present

## 2021-09-06 DIAGNOSIS — N1832 Chronic kidney disease, stage 3b: Secondary | ICD-10-CM | POA: Diagnosis not present

## 2021-09-06 DIAGNOSIS — I5022 Chronic systolic (congestive) heart failure: Secondary | ICD-10-CM | POA: Diagnosis not present

## 2021-09-06 DIAGNOSIS — L89152 Pressure ulcer of sacral region, stage 2: Secondary | ICD-10-CM | POA: Diagnosis not present

## 2021-09-06 DIAGNOSIS — E1122 Type 2 diabetes mellitus with diabetic chronic kidney disease: Secondary | ICD-10-CM | POA: Diagnosis not present

## 2021-09-06 DIAGNOSIS — J069 Acute upper respiratory infection, unspecified: Secondary | ICD-10-CM | POA: Diagnosis not present

## 2021-09-06 DIAGNOSIS — E1151 Type 2 diabetes mellitus with diabetic peripheral angiopathy without gangrene: Secondary | ICD-10-CM | POA: Diagnosis not present

## 2021-09-06 NOTE — Telephone Encounter (Signed)
Malori called from [place] [facility] to get verbal approval for patient to have [place] OT 1x per week for 4 weeks.  Gave Malori verbal OK

## 2021-09-09 DIAGNOSIS — L89152 Pressure ulcer of sacral region, stage 2: Secondary | ICD-10-CM | POA: Diagnosis not present

## 2021-09-09 DIAGNOSIS — E1122 Type 2 diabetes mellitus with diabetic chronic kidney disease: Secondary | ICD-10-CM | POA: Diagnosis not present

## 2021-09-09 DIAGNOSIS — J069 Acute upper respiratory infection, unspecified: Secondary | ICD-10-CM | POA: Diagnosis not present

## 2021-09-09 DIAGNOSIS — I4891 Unspecified atrial fibrillation: Secondary | ICD-10-CM | POA: Diagnosis not present

## 2021-09-09 DIAGNOSIS — I5022 Chronic systolic (congestive) heart failure: Secondary | ICD-10-CM | POA: Diagnosis not present

## 2021-09-09 DIAGNOSIS — N1832 Chronic kidney disease, stage 3b: Secondary | ICD-10-CM | POA: Diagnosis not present

## 2021-09-09 DIAGNOSIS — I130 Hypertensive heart and chronic kidney disease with heart failure and stage 1 through stage 4 chronic kidney disease, or unspecified chronic kidney disease: Secondary | ICD-10-CM | POA: Diagnosis not present

## 2021-09-09 DIAGNOSIS — E1151 Type 2 diabetes mellitus with diabetic peripheral angiopathy without gangrene: Secondary | ICD-10-CM | POA: Diagnosis not present

## 2021-09-09 DIAGNOSIS — J449 Chronic obstructive pulmonary disease, unspecified: Secondary | ICD-10-CM | POA: Diagnosis not present

## 2021-09-10 DIAGNOSIS — E1122 Type 2 diabetes mellitus with diabetic chronic kidney disease: Secondary | ICD-10-CM | POA: Diagnosis not present

## 2021-09-10 DIAGNOSIS — I5022 Chronic systolic (congestive) heart failure: Secondary | ICD-10-CM | POA: Diagnosis not present

## 2021-09-10 DIAGNOSIS — I4891 Unspecified atrial fibrillation: Secondary | ICD-10-CM | POA: Diagnosis not present

## 2021-09-10 DIAGNOSIS — E1151 Type 2 diabetes mellitus with diabetic peripheral angiopathy without gangrene: Secondary | ICD-10-CM | POA: Diagnosis not present

## 2021-09-10 DIAGNOSIS — I130 Hypertensive heart and chronic kidney disease with heart failure and stage 1 through stage 4 chronic kidney disease, or unspecified chronic kidney disease: Secondary | ICD-10-CM | POA: Diagnosis not present

## 2021-09-10 DIAGNOSIS — J449 Chronic obstructive pulmonary disease, unspecified: Secondary | ICD-10-CM | POA: Diagnosis not present

## 2021-09-10 DIAGNOSIS — N1832 Chronic kidney disease, stage 3b: Secondary | ICD-10-CM | POA: Diagnosis not present

## 2021-09-10 DIAGNOSIS — L89152 Pressure ulcer of sacral region, stage 2: Secondary | ICD-10-CM | POA: Diagnosis not present

## 2021-09-10 DIAGNOSIS — J069 Acute upper respiratory infection, unspecified: Secondary | ICD-10-CM | POA: Diagnosis not present

## 2021-09-10 NOTE — Telephone Encounter (Signed)
Taken care of in an orders only encounter

## 2021-09-11 ENCOUNTER — Ambulatory Visit (INDEPENDENT_AMBULATORY_CARE_PROVIDER_SITE_OTHER): Payer: Medicare HMO

## 2021-09-11 ENCOUNTER — Other Ambulatory Visit: Payer: Self-pay

## 2021-09-11 DIAGNOSIS — I5022 Chronic systolic (congestive) heart failure: Secondary | ICD-10-CM

## 2021-09-11 DIAGNOSIS — L89152 Pressure ulcer of sacral region, stage 2: Secondary | ICD-10-CM | POA: Diagnosis not present

## 2021-09-11 DIAGNOSIS — Z6841 Body Mass Index (BMI) 40.0 and over, adult: Secondary | ICD-10-CM

## 2021-09-11 DIAGNOSIS — Z7951 Long term (current) use of inhaled steroids: Secondary | ICD-10-CM

## 2021-09-11 DIAGNOSIS — J9610 Chronic respiratory failure, unspecified whether with hypoxia or hypercapnia: Secondary | ICD-10-CM

## 2021-09-11 DIAGNOSIS — M810 Age-related osteoporosis without current pathological fracture: Secondary | ICD-10-CM

## 2021-09-11 DIAGNOSIS — M7989 Other specified soft tissue disorders: Secondary | ICD-10-CM

## 2021-09-11 DIAGNOSIS — Z8673 Personal history of transient ischemic attack (TIA), and cerebral infarction without residual deficits: Secondary | ICD-10-CM

## 2021-09-11 DIAGNOSIS — E785 Hyperlipidemia, unspecified: Secondary | ICD-10-CM

## 2021-09-11 DIAGNOSIS — E1151 Type 2 diabetes mellitus with diabetic peripheral angiopathy without gangrene: Secondary | ICD-10-CM | POA: Diagnosis not present

## 2021-09-11 DIAGNOSIS — G4730 Sleep apnea, unspecified: Secondary | ICD-10-CM

## 2021-09-11 DIAGNOSIS — Z466 Encounter for fitting and adjustment of urinary device: Secondary | ICD-10-CM

## 2021-09-11 DIAGNOSIS — R29810 Facial weakness: Secondary | ICD-10-CM

## 2021-09-11 DIAGNOSIS — I4891 Unspecified atrial fibrillation: Secondary | ICD-10-CM

## 2021-09-11 DIAGNOSIS — F0390 Unspecified dementia without behavioral disturbance: Secondary | ICD-10-CM

## 2021-09-11 DIAGNOSIS — I2510 Atherosclerotic heart disease of native coronary artery without angina pectoris: Secondary | ICD-10-CM

## 2021-09-11 DIAGNOSIS — D696 Thrombocytopenia, unspecified: Secondary | ICD-10-CM

## 2021-09-11 DIAGNOSIS — I272 Pulmonary hypertension, unspecified: Secondary | ICD-10-CM

## 2021-09-11 DIAGNOSIS — E1122 Type 2 diabetes mellitus with diabetic chronic kidney disease: Secondary | ICD-10-CM

## 2021-09-11 DIAGNOSIS — J069 Acute upper respiratory infection, unspecified: Secondary | ICD-10-CM | POA: Diagnosis not present

## 2021-09-11 DIAGNOSIS — J449 Chronic obstructive pulmonary disease, unspecified: Secondary | ICD-10-CM | POA: Diagnosis not present

## 2021-09-11 DIAGNOSIS — Z87442 Personal history of urinary calculi: Secondary | ICD-10-CM

## 2021-09-11 DIAGNOSIS — R911 Solitary pulmonary nodule: Secondary | ICD-10-CM

## 2021-09-11 DIAGNOSIS — E039 Hypothyroidism, unspecified: Secondary | ICD-10-CM

## 2021-09-11 DIAGNOSIS — N1832 Chronic kidney disease, stage 3b: Secondary | ICD-10-CM | POA: Diagnosis not present

## 2021-09-11 DIAGNOSIS — I130 Hypertensive heart and chronic kidney disease with heart failure and stage 1 through stage 4 chronic kidney disease, or unspecified chronic kidney disease: Secondary | ICD-10-CM

## 2021-09-11 DIAGNOSIS — D509 Iron deficiency anemia, unspecified: Secondary | ICD-10-CM

## 2021-09-11 DIAGNOSIS — R339 Retention of urine, unspecified: Secondary | ICD-10-CM

## 2021-09-11 NOTE — Telephone Encounter (Signed)
Cancel palliative care referral

## 2021-09-12 ENCOUNTER — Telehealth: Payer: Self-pay | Admitting: *Deleted

## 2021-09-12 DIAGNOSIS — I130 Hypertensive heart and chronic kidney disease with heart failure and stage 1 through stage 4 chronic kidney disease, or unspecified chronic kidney disease: Secondary | ICD-10-CM | POA: Diagnosis not present

## 2021-09-12 DIAGNOSIS — J449 Chronic obstructive pulmonary disease, unspecified: Secondary | ICD-10-CM | POA: Diagnosis not present

## 2021-09-12 DIAGNOSIS — N1832 Chronic kidney disease, stage 3b: Secondary | ICD-10-CM | POA: Diagnosis not present

## 2021-09-12 DIAGNOSIS — E1122 Type 2 diabetes mellitus with diabetic chronic kidney disease: Secondary | ICD-10-CM | POA: Diagnosis not present

## 2021-09-12 DIAGNOSIS — I5022 Chronic systolic (congestive) heart failure: Secondary | ICD-10-CM | POA: Diagnosis not present

## 2021-09-12 DIAGNOSIS — I4891 Unspecified atrial fibrillation: Secondary | ICD-10-CM | POA: Diagnosis not present

## 2021-09-12 DIAGNOSIS — J069 Acute upper respiratory infection, unspecified: Secondary | ICD-10-CM | POA: Diagnosis not present

## 2021-09-12 DIAGNOSIS — E1151 Type 2 diabetes mellitus with diabetic peripheral angiopathy without gangrene: Secondary | ICD-10-CM | POA: Diagnosis not present

## 2021-09-12 DIAGNOSIS — L89152 Pressure ulcer of sacral region, stage 2: Secondary | ICD-10-CM | POA: Diagnosis not present

## 2021-09-12 MED ORDER — ACCU-CHEK GUIDE VI STRP: ORAL_STRIP | 3 refills | Status: AC

## 2021-09-12 MED ORDER — ACCU-CHEK SOFTCLIX LANCETS MISC: 3 refills | Status: AC

## 2021-09-12 NOTE — Telephone Encounter (Signed)
VM from [place] w/ [facility] Pt needing new glucometer, test strips & lancets. Pt wanting to know about a wheelchair order And what diabetic medications pt is on.  Will sending in glucometer & supplies into [facility] Pt does not have an order for a wheelchair, she needs a F2F visit for this Pt is to be on Trulicity, Che says that pt has not had any insulin in sometime, instructed her to have pt / daughter to call and schedule an appt with Dr. Livia Snellen for a check up and go over everything, this can be a video visit if need be & Donney Dice said we can call in verbal orders for labs to be drawn.

## 2021-09-12 NOTE — Addendum Note (Signed)
Addended by: Antonietta Barcelona D on: 09/12/2021 03:50 PM   Modules accepted: Orders

## 2021-09-16 DIAGNOSIS — J069 Acute upper respiratory infection, unspecified: Secondary | ICD-10-CM | POA: Diagnosis not present

## 2021-09-16 DIAGNOSIS — I4891 Unspecified atrial fibrillation: Secondary | ICD-10-CM | POA: Diagnosis not present

## 2021-09-16 DIAGNOSIS — E1151 Type 2 diabetes mellitus with diabetic peripheral angiopathy without gangrene: Secondary | ICD-10-CM | POA: Diagnosis not present

## 2021-09-16 DIAGNOSIS — L89152 Pressure ulcer of sacral region, stage 2: Secondary | ICD-10-CM | POA: Diagnosis not present

## 2021-09-16 DIAGNOSIS — I5022 Chronic systolic (congestive) heart failure: Secondary | ICD-10-CM | POA: Diagnosis not present

## 2021-09-16 DIAGNOSIS — I130 Hypertensive heart and chronic kidney disease with heart failure and stage 1 through stage 4 chronic kidney disease, or unspecified chronic kidney disease: Secondary | ICD-10-CM | POA: Diagnosis not present

## 2021-09-16 DIAGNOSIS — J449 Chronic obstructive pulmonary disease, unspecified: Secondary | ICD-10-CM | POA: Diagnosis not present

## 2021-09-16 DIAGNOSIS — N1832 Chronic kidney disease, stage 3b: Secondary | ICD-10-CM | POA: Diagnosis not present

## 2021-09-16 DIAGNOSIS — E1122 Type 2 diabetes mellitus with diabetic chronic kidney disease: Secondary | ICD-10-CM | POA: Diagnosis not present

## 2021-09-17 DIAGNOSIS — I4891 Unspecified atrial fibrillation: Secondary | ICD-10-CM | POA: Diagnosis not present

## 2021-09-17 DIAGNOSIS — J069 Acute upper respiratory infection, unspecified: Secondary | ICD-10-CM | POA: Diagnosis not present

## 2021-09-17 DIAGNOSIS — J449 Chronic obstructive pulmonary disease, unspecified: Secondary | ICD-10-CM | POA: Diagnosis not present

## 2021-09-17 DIAGNOSIS — E1122 Type 2 diabetes mellitus with diabetic chronic kidney disease: Secondary | ICD-10-CM | POA: Diagnosis not present

## 2021-09-17 DIAGNOSIS — E1151 Type 2 diabetes mellitus with diabetic peripheral angiopathy without gangrene: Secondary | ICD-10-CM | POA: Diagnosis not present

## 2021-09-17 DIAGNOSIS — N1832 Chronic kidney disease, stage 3b: Secondary | ICD-10-CM | POA: Diagnosis not present

## 2021-09-17 DIAGNOSIS — I5022 Chronic systolic (congestive) heart failure: Secondary | ICD-10-CM | POA: Diagnosis not present

## 2021-09-17 DIAGNOSIS — L89152 Pressure ulcer of sacral region, stage 2: Secondary | ICD-10-CM | POA: Diagnosis not present

## 2021-09-17 DIAGNOSIS — I130 Hypertensive heart and chronic kidney disease with heart failure and stage 1 through stage 4 chronic kidney disease, or unspecified chronic kidney disease: Secondary | ICD-10-CM | POA: Diagnosis not present

## 2021-09-18 ENCOUNTER — Telehealth: Payer: Self-pay | Admitting: Family Medicine

## 2021-09-18 ENCOUNTER — Other Ambulatory Visit: Payer: Self-pay | Admitting: *Deleted

## 2021-09-18 DIAGNOSIS — I4891 Unspecified atrial fibrillation: Secondary | ICD-10-CM | POA: Diagnosis not present

## 2021-09-18 DIAGNOSIS — N1832 Chronic kidney disease, stage 3b: Secondary | ICD-10-CM | POA: Diagnosis not present

## 2021-09-18 DIAGNOSIS — R300 Dysuria: Secondary | ICD-10-CM

## 2021-09-18 DIAGNOSIS — J449 Chronic obstructive pulmonary disease, unspecified: Secondary | ICD-10-CM | POA: Diagnosis not present

## 2021-09-18 DIAGNOSIS — I130 Hypertensive heart and chronic kidney disease with heart failure and stage 1 through stage 4 chronic kidney disease, or unspecified chronic kidney disease: Secondary | ICD-10-CM | POA: Diagnosis not present

## 2021-09-18 DIAGNOSIS — I5022 Chronic systolic (congestive) heart failure: Secondary | ICD-10-CM | POA: Diagnosis not present

## 2021-09-18 DIAGNOSIS — E1122 Type 2 diabetes mellitus with diabetic chronic kidney disease: Secondary | ICD-10-CM | POA: Diagnosis not present

## 2021-09-18 DIAGNOSIS — L89152 Pressure ulcer of sacral region, stage 2: Secondary | ICD-10-CM | POA: Diagnosis not present

## 2021-09-18 DIAGNOSIS — E1151 Type 2 diabetes mellitus with diabetic peripheral angiopathy without gangrene: Secondary | ICD-10-CM | POA: Diagnosis not present

## 2021-09-18 DIAGNOSIS — J069 Acute upper respiratory infection, unspecified: Secondary | ICD-10-CM | POA: Diagnosis not present

## 2021-09-18 MED ORDER — ACCU-CHEK GUIDE W/DEVICE KIT: PACK | 0 refills | Status: AC

## 2021-09-18 NOTE — Telephone Encounter (Signed)
Order urinalyis and culture please.

## 2021-09-18 NOTE — Telephone Encounter (Signed)
Scientist, clinical (histocompatibility and immunogenetics) (Nurse with [facility]) called stating that pt is complaining of UTI. Wants to know if provider can put in order for pts urine to be tested for UTI.  Call Shay to confirm and she will drop off urine sample.  [phone]

## 2021-09-18 NOTE — Telephone Encounter (Signed)
Done

## 2021-09-19 DIAGNOSIS — I959 Hypotension, unspecified: Secondary | ICD-10-CM | POA: Diagnosis not present

## 2021-09-19 DIAGNOSIS — J9811 Atelectasis: Secondary | ICD-10-CM | POA: Diagnosis not present

## 2021-09-19 DIAGNOSIS — J90 Pleural effusion, not elsewhere classified: Secondary | ICD-10-CM | POA: Diagnosis not present

## 2021-09-19 DIAGNOSIS — R531 Weakness: Secondary | ICD-10-CM | POA: Diagnosis not present

## 2021-09-19 DIAGNOSIS — J441 Chronic obstructive pulmonary disease with (acute) exacerbation: Secondary | ICD-10-CM | POA: Diagnosis not present

## 2021-09-19 DIAGNOSIS — R069 Unspecified abnormalities of breathing: Secondary | ICD-10-CM | POA: Diagnosis not present

## 2021-09-19 DIAGNOSIS — Z7401 Bed confinement status: Secondary | ICD-10-CM | POA: Diagnosis not present

## 2021-09-19 DIAGNOSIS — R5381 Other malaise: Secondary | ICD-10-CM | POA: Diagnosis not present

## 2021-09-19 DIAGNOSIS — R29898 Other symptoms and signs involving the musculoskeletal system: Secondary | ICD-10-CM | POA: Diagnosis not present

## 2021-09-19 DIAGNOSIS — Z885 Allergy status to narcotic agent status: Secondary | ICD-10-CM | POA: Diagnosis not present

## 2021-09-19 DIAGNOSIS — R059 Cough, unspecified: Secondary | ICD-10-CM | POA: Diagnosis not present

## 2021-09-21 DIAGNOSIS — M6281 Muscle weakness (generalized): Secondary | ICD-10-CM | POA: Diagnosis not present

## 2021-09-21 DIAGNOSIS — I5033 Acute on chronic diastolic (congestive) heart failure: Secondary | ICD-10-CM | POA: Diagnosis not present

## 2021-09-21 DIAGNOSIS — R2689 Other abnormalities of gait and mobility: Secondary | ICD-10-CM | POA: Diagnosis not present

## 2021-09-25 ENCOUNTER — Other Ambulatory Visit: Payer: Self-pay

## 2021-09-25 ENCOUNTER — Ambulatory Visit (HOSPITAL_COMMUNITY)
Admission: RE | Admit: 2021-09-25 | Discharge: 2021-09-25 | Disposition: A | Discharge status: Home / Self Care | Payer: Medicare HMO | Source: Ambulatory Visit | Attending: Internal Medicine | Admitting: Internal Medicine

## 2021-09-25 DIAGNOSIS — I5022 Chronic systolic (congestive) heart failure: Secondary | ICD-10-CM

## 2021-09-25 DIAGNOSIS — J449 Chronic obstructive pulmonary disease, unspecified: Secondary | ICD-10-CM | POA: Diagnosis not present

## 2021-09-25 DIAGNOSIS — N1832 Chronic kidney disease, stage 3b: Secondary | ICD-10-CM | POA: Diagnosis not present

## 2021-09-25 DIAGNOSIS — J069 Acute upper respiratory infection, unspecified: Secondary | ICD-10-CM | POA: Diagnosis not present

## 2021-09-25 DIAGNOSIS — I130 Hypertensive heart and chronic kidney disease with heart failure and stage 1 through stage 4 chronic kidney disease, or unspecified chronic kidney disease: Secondary | ICD-10-CM | POA: Diagnosis not present

## 2021-09-25 DIAGNOSIS — E1151 Type 2 diabetes mellitus with diabetic peripheral angiopathy without gangrene: Secondary | ICD-10-CM | POA: Diagnosis not present

## 2021-09-25 DIAGNOSIS — L89152 Pressure ulcer of sacral region, stage 2: Secondary | ICD-10-CM | POA: Diagnosis not present

## 2021-09-25 DIAGNOSIS — I4891 Unspecified atrial fibrillation: Secondary | ICD-10-CM | POA: Diagnosis not present

## 2021-09-25 DIAGNOSIS — E1122 Type 2 diabetes mellitus with diabetic chronic kidney disease: Secondary | ICD-10-CM | POA: Diagnosis not present

## 2021-09-25 NOTE — Progress Notes (Signed)
Heart Failure TeleHealth Note    PATIENT CANCELLED APPOINTMENT. NOTE LEFT FOR TEMPLATING PURPOSES ONLY    Date:  09/25/2021   ID:  [name], DOB [birth year], MRN [id number]  Location: Home  Provider location: [facility] Type of Visit: Established patient  PCP:  Claretta Fraise, MD  Cardiologist:  Minus Breeding, MD Primary HF: Alizon Schmeling  Chief Complaint: Heart Failure follow-up   History of Present Illness:  [name] is a 75 y.o. female with morbid obesity, chronic diastolic HF, CKD Stage IIIb, DM2, pulmonary hypertension, permanent atrial fibrillation, chronic hypoxic respiratory failure on home O2 .   Admitted 11/20 [place] from [facility] for further management of acute on chronic diastolic HF, pulmonary arterial hypertension and acute kidney injury. HF team admitted. Initially on Norepi but later weaned off.  Diuresed with IV lasix + milrinone. Once diuresed had RHC with mod-severe PAH and mod-severely reduced CO. Discharged on 10 mg midodrine tid due to hypotension.  Placed on sildenafil + macitentan and continued on home oxygen. Discharged with [place]. Discharge weight 276 pounds.    We have not seen her since 12/20. Schedule today for an audio/video conferencing for a telehealth visit today. She remains homebound under Hospice care. Says she is doing pretty good. Has a Foley catheter in. Denies edema, orthopnea or PND. BP stable on midodrine. Remains on O2    RHC 10/13/19  RA = 19 RV = 84/19 PA = 92/28 (48) PCW = 22 Fick cardiac output/index = 7.7/3.1 Thermo CO/CI = 4.6/1.9 PVR = 5.5 WU (thermo) Ao sat = 99% PA sat = 65%, 67%    ROS: All systems negative except as listed in HPI, PMH and Problem List.      [name] denies symptoms worrisome for COVID 19.   Past Medical History:  Diagnosis Date   Acute on chronic diastolic (congestive) heart failure ([place]) 10/12/2019   Anemia    Arthritis    Asthma    Atrial  fibrillation (HCC)    CAD (coronary artery disease)    LAD 60% proximal stenosis mid and distal 60% stenosis 2009.   Cataract    had surgery   CHF (congestive heart failure) (HCC)    COPD (chronic obstructive pulmonary disease) (HCC)    Diabetes mellitus    Gait instability    uses cane   GERD (gastroesophageal reflux disease)    Hyperlipidemia    Hypertension    Obesity    Oxygen dependent    2 liter per nasal cannula   Sleep apnea, obstructive 2008   Stroke ([place])    2 mini   Past Surgical History:  Procedure Laterality Date   APPENDECTOMY     CARPAL TUNNEL RELEASE     right hand   CATARACT EXTRACTION W/ INTRAOCULAR LENS  IMPLANT, BILATERAL     CERVICAL SPINE SURGERY     fusion   CESAREAN SECTION     CHOLECYSTECTOMY     COLONOSCOPY  11/22/2002   normal - Dr.Rrourk   COLONOSCOPY  11/26/2005   incomplete with negative BE (Dr. Rowe Pavy, [facility])   Jette IM NAIL Left 05/08/2013   Procedure: INTRAMEDULLARY (IM) NAIL FEMORAL--LONG(BIOMET SYSTEM) and Zimmer Cables;  Surgeon: Augustin Schooling, MD;  Location: [facility];  Service: Orthopedics;  Laterality: Left;   HIP SURGERY Left    KNEE ARTHROSCOPY     twice, left   RIGHT HEART CATH N/A 10/13/2019  Procedure: Luiz Blare Placement;  Surgeon: Jolaine Artist, MD;  Location: [facility];  Service: Cardiovascular;  Laterality: N/A;   UMBILICAL HERNIA REPAIR     UPPER GASTROINTESTINAL ENDOSCOPY  11/22/2002   Normal - Dr. Gala Romney     Current Outpatient Medications  Medication Sig Dispense Refill   Accu-Chek Softclix Lancets lancets Check BS BID Dx E11.9 200 each 3   acetaminophen (TYLENOL) 500 MG tablet Take 500 mg by mouth every 4 (four) hours as needed for mild pain.      albuterol (PROVENTIL) (2.5 MG/3ML) 0.083% nebulizer solution INHALE THE CONTENTS OF 1 VIAL VIA NEBULIZER EVERY 6 HOURS AS NEEDED FOR WHEEZING OR SHORTNESS OF BREATH 150 mL 0   albuterol (VENTOLIN HFA) 108 (90 Base) MCG/ACT inhaler INHALE 1 PUFF BY  MOUTH EVERY 6 HOURS AS NEEDED FOR SHORTNESS OF BREATH 25.5 g 0   apixaban (ELIQUIS) 5 MG TABS tablet TAKE ONE TABLET BY MOUTH TWICE DAILY 180 tablet 3   Blood Glucose Monitoring Suppl (ACCU-CHEK GUIDE) w/Device KIT Check BS 2 times daily Dx E11.9 1 kit 0   Catheters (CATHETER RED RUBBER COATED) MISC Pure Wick brand is preferred. Use every 4 hours as needed for urinary incontinence. 180 each 5   Dulaglutide (TRULICITY) 1.5 UJ/8.1XB SOPN Inject content of one pen under the skin weekly 2 mL 2   FEROSUL 325 (65 Fe) MG tablet TAKE ONE TABLET BY MOUTH DAILY WITH BREAKFAST 90 tablet 0   gabapentin (NEURONTIN) 300 MG capsule Take 1 capsule (300 mg total) by mouth at bedtime. (NEEDS TO BE SEEN BEFORE NEXT REFILL) 30 capsule 0   glucose blood (ACCU-CHEK GUIDE) test strip Check BS BID Dx E11.9 200 each 3   ipratropium-albuterol (DUONEB) 0.5-2.5 (3) MG/3ML SOLN INHALE THE CONTENTS OF 1 VIAL VIA NEBULIZER EVERY 6 HOURS AS NEEDED FOR SHORTNESS OF BREATH 360 mL 0   levothyroxine (SYNTHROID) 50 MCG tablet TAKE ONE TABLET BY MOUTH DAILY 90 tablet 0   midodrine (PROAMATINE) 10 MG tablet TAKE ONE TABLET BY MOUTH THREE TIMES DAILY. NEEDS APPOINTMENT FOR FURTHER REFILLS. 270 tablet 0   nystatin cream (MYCOSTATIN) Apply 1 application topically 2 (two) times daily. 30 g 1   ondansetron (ZOFRAN-ODT) 8 MG disintegrating tablet Take 1 tablet (8 mg total) by mouth every 6 (six) hours as needed for nausea or vomiting. 20 tablet 1   polyethylene glycol (MIRALAX / GLYCOLAX) 17 g packet Take 17 g by mouth 2 (two) times daily. 14 each 0   potassium chloride SA (KLOR-CON) 20 MEQ tablet TAKE ONE TABLET BY MOUTH DAILY 90 tablet 3   rivastigmine (EXELON) 4.6 mg/24hr Place 1 patch (4.6 mg total) onto the skin daily. 30 patch 12   sildenafil (REVATIO) 20 MG tablet Take 2 tablets (40 mg total) by mouth 3 (three) times daily. 180 tablet 6   spironolactone (ALDACTONE) 25 MG tablet Take 0.5 tablets (12.5 mg total) by mouth daily. 45 tablet  3   torsemide (DEMADEX) 20 MG tablet Take 1 tablet (20 mg total) by mouth daily. 30 tablet 6   vitamin B-12 (CYANOCOBALAMIN) 500 MCG tablet Take 500 mcg by mouth daily.     No current facility-administered medications for this encounter.    Allergies:   Lisinopril and Codeine   Social History:  The patient  reports that she has never smoked. She has never used smokeless tobacco. She reports that she does not drink alcohol and does not use drugs.   Family History:  The patient's  family history includes Alzheimer's disease in her father and mother; Asthma in her daughter and grandchild; Breast cancer in her sister; Cancer in her brother, mother, sister, and sister; Colon cancer in her mother; Diabetes in her daughter, mother, sister, and another family member; Heart attack in her mother, sister, and another family member; Heart disease in her brother, mother, and sister; Heart murmur in her brother; Kidney disease in her mother; Migraines in her sister.   ROS:  Please see the history of present illness.   All other systems are personally reviewed and negative.   Exam:  (Video/Tele Health Call; Exam is subjective and or/visual.) General:  Speaks in full sentences. No resp difficulty. Lungs: Normal respiratory effort with conversation.  Abdomen: Non-distended per patient report Extremities: Pt denies edema. Neuro: Alert & oriented x 3.   Recent Labs: No results found for requested labs within last 8760 hours.  Personally reviewed   Wt Readings from Last 3 Encounters:  05/30/20 127 kg (279 lb 15.8 oz)  05/18/20 127 kg (280 lb)  03/22/20 124.3 kg (274 lb 2 oz)      ASSESSMENT AND PLAN:  1. PAH with cor pulmonale, end-stage - RHC numbers as above, likely multifactorial WHO Group, I,II,III - NYHA IV Now bedbound - Volume status ok today. Continue current dose of torsemide.  - Continue midodrine 10 mg tid.    2. Chronic diastolic heart failure with R>L HF - Echo 7/20 EF 60-65% RV  reportedly normal with RVSP 85 - NYHA IV Volume status is good. No SOB or hypotension   3. Chronic renal failure - stage IV - Renal u/s with severe bilateral cortical thinning - Not HD candidate - Check BMET today.     4 Permanent AF - On eliquis. No rep - Rate controlled.    5. Chronic hypoxic respiratory failure on home O2 - non-smoker. Suspect OSA /OHS/PAH    COVID screen The patient does not have any symptoms that suggest any further testing/ screening at this time.  Social distancing reinforced today.  Recommended follow-up:  As needed.   Relevant cardiac medications were reviewed at length with the patient today.   The patient does not have concerns regarding their medications at this time.   The following changes were made today:  As above  Today, I have spent 12 minutes with the patient with telehealth technology discussing the above issues .    Signed, Glori Bickers, MD  09/25/2021 9:06 AM  [facility] [place] [id number] [phone] (office) [phone] (fax)

## 2021-09-26 ENCOUNTER — Ambulatory Visit: Payer: Medicare HMO

## 2021-09-26 DIAGNOSIS — L89152 Pressure ulcer of sacral region, stage 2: Secondary | ICD-10-CM | POA: Diagnosis not present

## 2021-09-26 DIAGNOSIS — J069 Acute upper respiratory infection, unspecified: Secondary | ICD-10-CM | POA: Diagnosis not present

## 2021-09-26 DIAGNOSIS — I130 Hypertensive heart and chronic kidney disease with heart failure and stage 1 through stage 4 chronic kidney disease, or unspecified chronic kidney disease: Secondary | ICD-10-CM | POA: Diagnosis not present

## 2021-09-26 DIAGNOSIS — N1832 Chronic kidney disease, stage 3b: Secondary | ICD-10-CM | POA: Diagnosis not present

## 2021-09-26 DIAGNOSIS — J449 Chronic obstructive pulmonary disease, unspecified: Secondary | ICD-10-CM | POA: Diagnosis not present

## 2021-09-26 DIAGNOSIS — I4891 Unspecified atrial fibrillation: Secondary | ICD-10-CM | POA: Diagnosis not present

## 2021-09-26 DIAGNOSIS — I5022 Chronic systolic (congestive) heart failure: Secondary | ICD-10-CM | POA: Diagnosis not present

## 2021-09-26 DIAGNOSIS — E1122 Type 2 diabetes mellitus with diabetic chronic kidney disease: Secondary | ICD-10-CM | POA: Diagnosis not present

## 2021-09-26 DIAGNOSIS — E1151 Type 2 diabetes mellitus with diabetic peripheral angiopathy without gangrene: Secondary | ICD-10-CM | POA: Diagnosis not present

## 2021-10-02 ENCOUNTER — Telehealth (INDEPENDENT_AMBULATORY_CARE_PROVIDER_SITE_OTHER): Payer: Medicare HMO | Admitting: Family Medicine

## 2021-10-02 ENCOUNTER — Encounter: Payer: Self-pay | Admitting: Family Medicine

## 2021-10-02 DIAGNOSIS — R300 Dysuria: Secondary | ICD-10-CM

## 2021-10-02 DIAGNOSIS — E1121 Type 2 diabetes mellitus with diabetic nephropathy: Secondary | ICD-10-CM

## 2021-10-02 DIAGNOSIS — I5022 Chronic systolic (congestive) heart failure: Secondary | ICD-10-CM

## 2021-10-02 DIAGNOSIS — I2510 Atherosclerotic heart disease of native coronary artery without angina pectoris: Secondary | ICD-10-CM

## 2021-10-02 DIAGNOSIS — N1832 Chronic kidney disease, stage 3b: Secondary | ICD-10-CM

## 2021-10-02 LAB — URINALYSIS, COMPLETE
Bilirubin, UA: NEGATIVE
Glucose, UA: NEGATIVE
Ketones, UA: NEGATIVE
Nitrite, UA: NEGATIVE
Protein,UA: NEGATIVE
Specific Gravity, UA: 1.01 (ref 1.005–1.030)
Urobilinogen, Ur: 0.2 mg/dL (ref 0.2–1.0)
pH, UA: 6 (ref 5.0–7.5)

## 2021-10-02 LAB — MICROSCOPIC EXAMINATION: Renal Epithel, UA: NONE SEEN /hpf

## 2021-10-02 MED ORDER — SULFAMETHOXAZOLE-TRIMETHOPRIM 800-160 MG PO TABS: 1.0000 | ORAL_TABLET | Freq: Two times a day (BID) | ORAL | 0 refills | Status: AC

## 2021-10-02 NOTE — Progress Notes (Addendum)
Subjective:  Patient ID: [name], female    DOB: 01-20-1946  Age: 75 y.o. MRN: [id number]  CC: No chief complaint on file.   HPI [name] presents for video visit due to the need for a wheelchair to allow her to move about her dwelling. With assistance from her son and daughter she can make transfers and they can push her. This allows her to toilet and groom herself. Sh can also sit out on the porch. Without it she is in bed 24/7. She had a stroke with residual generalized weakness. She also has CHF and ASCVD for which she takes eliquis. Pt. Has decided to DC treatment for diabetes. She is no longer taking her med, including Trulicity. She has also DCed checking glucose.   Depression screen [facility] 2/9 03/22/2020 03/22/2020 06/30/2019  Decreased Interest 2 2 0  Down, Depressed, Hopeless 1 1 0  PHQ - 2 Score 3 3 0  Altered sleeping 3 - -  Tired, decreased energy 3 - -  Change in appetite 1 - -  Feeling bad or failure about yourself  0 - -  Trouble concentrating 1 - -  Moving slowly or fidgety/restless 0 - -  Suicidal thoughts 0 - -  PHQ-9 Score 11 - -  Difficult doing work/chores Somewhat difficult - -  Some recent data might be hidden    History [name] has a past medical history of Acute on chronic diastolic (congestive) heart failure ([place]) (10/12/2019), Anemia, Arthritis, Asthma, Atrial fibrillation ([place]), CAD (coronary artery disease), Cataract, CHF (congestive heart failure) ([place]), COPD (chronic obstructive pulmonary disease) ([place]), Diabetes mellitus, Gait instability, GERD (gastroesophageal reflux disease), Hyperlipidemia, Hypertension, Obesity, Oxygen dependent, Sleep apnea, obstructive (2008), and Stroke ([place]).   She has a past surgical history that includes Cataract extraction w/ intraocular lens  implant, bilateral; Appendectomy; Cholecystectomy; Carpal tunnel release; Cervical spine surgery; Cesarean section; Umbilical hernia repair; Knee arthroscopy;  Colonoscopy (11/22/2002); Upper gastrointestinal endoscopy (11/22/2002); Colonoscopy (11/26/2005); Femur IM nail (Left, 05/08/2013); Hip surgery (Left); Eye surgery; and RIGHT HEART CATH (N/A, 10/13/2019).   Her family history includes Alzheimer's disease in her father and mother; Asthma in her daughter and grandchild; Breast cancer in her sister; Cancer in her brother, mother, sister, and sister; Colon cancer in her mother; Diabetes in her daughter, mother, sister, and another family member; Heart attack in her mother, sister, and another family member; Heart disease in her brother, mother, and sister; Heart murmur in her brother; Kidney disease in her mother; Migraines in her sister.She reports that she has never smoked. She has never used smokeless tobacco. She reports that she does not drink alcohol and does not use drugs.    ROS Review of Systems  Constitutional: Negative.   HENT: Negative.    Respiratory:  Positive for shortness of breath.   Cardiovascular:  Negative for chest pain.  Gastrointestinal:  Negative for abdominal pain.  Musculoskeletal:  Negative for arthralgias.  Neurological:  Positive for weakness.   Objective:  There were no vitals taken for this visit.  BP Readings from Last 3 Encounters:  05/31/20 129/64  05/18/20 (!) 124/58  03/22/20 (!) 172/84    Wt Readings from Last 3 Encounters:  05/30/20 279 lb 15.8 oz (127 kg)  05/18/20 280 lb (127 kg)  03/22/20 274 lb 2 oz (124.3 kg)     Physical Exam  Via video shows that she is bedridden. Unable to sit up without assistance. OBese. NAD. Alert and oriented.  Assessment & Plan:   Diagnoses and all orders for this visit:  Coronary artery disease involving native coronary artery of native heart without angina pectoris -     DME Wheelchair manual  Dysuria -     Urine Culture -     Urinalysis, Complete -     Microscopic Examination -     sulfamethoxazole-trimethoprim (BACTRIM DS) 800-160 MG tablet; Take 1  tablet by mouth 2 (two) times daily.  Chronic renal impairment, stage 3b (HCC) -     DME Wheelchair manual  Severe obesity (BMI >= 40) (HCC) -     DME Wheelchair manual  Chronic systolic congestive heart failure ([place]) -     DME Wheelchair manual  Diabetic nephropathy associated with type 2 diabetes mellitus ([place]) -     DME Wheelchair manual      I have discontinued [name]'s FeroSul and [employer]. I am also having her start on sulfamethoxazole-trimethoprim. Additionally, I am having her maintain her acetaminophen, sildenafil, ondansetron, vitamin B-12, Catheter Red Rubber Coated, potassium chloride SA, albuterol, polyethylene glycol, albuterol, levothyroxine, ipratropium-albuterol, nystatin cream, spironolactone, apixaban, gabapentin, torsemide, rivastigmine, midodrine, Accu-Chek Softclix Lancets, Accu-Chek Guide, and Accu-Chek Guide.  Allergies as of 10/02/2021       Reactions   Lisinopril    R/t to kidney   Codeine Nausea And Vomiting        Medication List        Accurate as of October 02, 2021  9:00 PM. If you have any questions, ask your nurse or doctor.          STOP taking these medications    FeroSul 325 (65 FE) MG tablet Generic drug: ferrous sulfate Stopped by: Claretta Fraise, MD   Trulicity 1.5 CH/8.8FO Sopn Generic drug: Dulaglutide Stopped by: Claretta Fraise, MD       TAKE these medications    Accu-Chek Guide test strip Generic drug: glucose blood Check BS BID Dx E11.9   Accu-Chek Guide w/Device Kit Check BS 2 times daily Dx E11.9   Accu-Chek Softclix Lancets lancets Check BS BID Dx E11.9   acetaminophen 500 MG tablet Commonly known as: TYLENOL Take 500 mg by mouth every 4 (four) hours as needed for mild pain.   albuterol (2.5 MG/3ML) 0.083% nebulizer solution Commonly known as: PROVENTIL INHALE THE CONTENTS OF 1 VIAL VIA NEBULIZER EVERY 6 HOURS AS NEEDED FOR WHEEZING OR SHORTNESS OF BREATH   albuterol 108 (90 Base) MCG/ACT  inhaler Commonly known as: VENTOLIN HFA INHALE 1 PUFF BY MOUTH EVERY 6 HOURS AS NEEDED FOR SHORTNESS OF BREATH   apixaban 5 MG Tabs tablet Commonly known as: Eliquis TAKE ONE TABLET BY MOUTH TWICE DAILY   Catheter Red Rubber Coated Misc Pure Wick brand is preferred. Use every 4 hours as needed for urinary incontinence.   gabapentin 300 MG capsule Commonly known as: NEURONTIN Take 1 capsule (300 mg total) by mouth at bedtime. (NEEDS TO BE SEEN BEFORE NEXT REFILL)   ipratropium-albuterol 0.5-2.5 (3) MG/3ML Soln Commonly known as: DUONEB INHALE THE CONTENTS OF 1 VIAL VIA NEBULIZER EVERY 6 HOURS AS NEEDED FOR SHORTNESS OF BREATH   levothyroxine 50 MCG tablet Commonly known as: SYNTHROID TAKE ONE TABLET BY MOUTH DAILY   midodrine 10 MG tablet Commonly known as: PROAMATINE TAKE ONE TABLET BY MOUTH THREE TIMES DAILY. NEEDS APPOINTMENT FOR FURTHER REFILLS.   nystatin cream Commonly known as: MYCOSTATIN Apply 1 application topically 2 (two) times daily.   ondansetron 8 MG disintegrating tablet Commonly  known as: ZOFRAN-ODT Take 1 tablet (8 mg total) by mouth every 6 (six) hours as needed for nausea or vomiting.   polyethylene glycol 17 g packet Commonly known as: MIRALAX / GLYCOLAX Take 17 g by mouth 2 (two) times daily.   potassium chloride SA 20 MEQ tablet Commonly known as: KLOR-CON TAKE ONE TABLET BY MOUTH DAILY   rivastigmine 4.6 mg/24hr Commonly known as: Exelon Place 1 patch (4.6 mg total) onto the skin daily.   sildenafil 20 MG tablet Commonly known as: REVATIO Take 2 tablets (40 mg total) by mouth 3 (three) times daily.   spironolactone 25 MG tablet Commonly known as: ALDACTONE Take 0.5 tablets (12.5 mg total) by mouth daily.   sulfamethoxazole-trimethoprim 800-160 MG tablet Commonly known as: BACTRIM DS Take 1 tablet by mouth 2 (two) times daily. Started by: Claretta Fraise, MD   torsemide 20 MG tablet Commonly known as: DEMADEX Take 1 tablet (20 mg total)  by mouth daily.   vitamin B-12 500 MCG tablet Commonly known as: CYANOCOBALAMIN Take 500 mcg by mouth daily.               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  A cane, crutch, or walker will not resolve issue with performing activities of daily living. A wheelchair will allow patient to safely perform daily activities. Patient can safely propel the wheelchair in the home or has a caregiver who can provide assistance. Length of need Lifetime. Accessories: elevating leg rests (ELRs), wheel locks, extensions and anti-tippers.   10/02/21 2058           Virtual Visit via Video  I discussed the limitations, risks, security and privacy concerns of performing an evaluation and management service byVideo and the availability of in person appointments. I also discussed with the patient that there may be a patient responsible charge related to this service. The patient expressed understanding and agreed to proceed. Pt. Is at home. Dr. Livia Snellen is in his office.  Follow Up Instructions:   I discussed the assessment and treatment plan with the patient. The patient was provided an opportunity to ask questions and all were answered. The patient agreed with the plan and demonstrated an understanding of the instructions.   The patient was advised to call back or seek an in-person evaluation if the symptoms worsen or if the condition fails to improve as anticipated.  Total minutes  contact time: 24   Follow-up: Return if symptoms worsen or fail to improve.  Claretta Fraise, M.D.

## 2021-10-03 DIAGNOSIS — E1122 Type 2 diabetes mellitus with diabetic chronic kidney disease: Secondary | ICD-10-CM | POA: Diagnosis not present

## 2021-10-06 DIAGNOSIS — I5033 Acute on chronic diastolic (congestive) heart failure: Secondary | ICD-10-CM | POA: Diagnosis not present

## 2021-10-08 ENCOUNTER — Ambulatory Visit (INDEPENDENT_AMBULATORY_CARE_PROVIDER_SITE_OTHER): Payer: Medicare HMO

## 2021-10-08 VITALS — Ht 65.0 in | Wt 274.0 lb

## 2021-10-08 DIAGNOSIS — Z0000 Encounter for general adult medical examination without abnormal findings: Secondary | ICD-10-CM

## 2021-10-08 LAB — URINE CULTURE

## 2021-10-08 NOTE — Patient Instructions (Addendum)
[name] , Thank you for taking time to come for your Medicare Wellness Visit. I appreciate your ongoing commitment to your health goals. Please review the following plan we discussed and let me know if I can assist you in the future.   Screening recommendations/referrals: Colonoscopy: No longer required Mammogram: No longer required Bone Density: done 01/21/2018 - recommend repeats every 2 years - not required - bedridden Recommended yearly ophthalmology/optometry visit for glaucoma screening and checkup Recommended yearly dental visit for hygiene and checkup  Vaccinations: Influenza vaccine: Done 2019 recommend repeat every fall *Due Pneumococcal vaccine: Done 09/15/2014 & 11/12/2015 Tdap vaccine: Done 03/09/2012 - Repeat in 10 years  Shingles vaccine: Due.   Covid-19: Declined  Advanced directives: Please bring a copy of your health care power of attorney and living will to the office to be added to your chart at your convenience.   Conditions/risks identified: Try to do stretches and exercises every hour while in bed to at least be able to change positions. Try to limit your portion sizes to lose weight.   Next appointment: Follow up in one year for your annual wellness visit    Preventive Care 65 Years and Older, Female Preventive care refers to lifestyle choices and visits with your health care provider that can promote health and wellness. What does preventive care include? A yearly physical exam. This is also called an annual well check. Dental exams once or twice a year. Routine eye exams. Ask your health care provider how often you should have your eyes checked. Personal lifestyle choices, including: Daily care of your teeth and gums. Regular physical activity. Eating a healthy diet. Avoiding tobacco and drug use. Limiting alcohol use. Practicing safe sex. Taking low-dose aspirin every day. Taking vitamin and mineral supplements as recommended by your health care  provider. What happens during an annual well check? The services and screenings done by your health care provider during your annual well check will depend on your age, overall health, lifestyle risk factors, and family history of disease. Counseling  Your health care provider may ask you questions about your: Alcohol use. Tobacco use. Drug use. Emotional well-being. Home and relationship well-being. Sexual activity. Eating habits. History of falls. Memory and ability to understand (cognition). Work and work Statistician. Reproductive health. Screening  You may have the following tests or measurements: Height, weight, and BMI. Blood pressure. Lipid and cholesterol levels. These may be checked every 5 years, or more frequently if you are over 73 years old. Skin check. Lung cancer screening. You may have this screening every year starting at age 77 if you have a 30-pack-year history of smoking and currently smoke or have quit within the past 15 years. Fecal occult blood test (FOBT) of the stool. You may have this test every year starting at age 77. Flexible sigmoidoscopy or colonoscopy. You may have a sigmoidoscopy every 5 years or a colonoscopy every 10 years starting at age 77. Hepatitis C blood test. Hepatitis B blood test. Sexually transmitted disease (STD) testing. Diabetes screening. This is done by checking your blood sugar (glucose) after you have not eaten for a while (fasting). You may have this done every 1-3 years. Bone density scan. This is done to screen for osteoporosis. You may have this done starting at age 77. Mammogram. This may be done every 1-2 years. Talk to your health care provider about how often you should have regular mammograms. Talk with your health care provider about your test results, treatment options, and  if necessary, the need for more tests. Vaccines  Your health care provider may recommend certain vaccines, such as: Influenza vaccine. This is  recommended every year. Tetanus, diphtheria, and acellular pertussis (Tdap, Td) vaccine. You may need a Td booster every 10 years. Zoster vaccine. You may need this after age 77. Pneumococcal 13-valent conjugate (PCV13) vaccine. One dose is recommended after age 77. Pneumococcal polysaccharide (PPSV23) vaccine. One dose is recommended after age 77. Talk to your health care provider about which screenings and vaccines you need and how often you need them. This information is not intended to replace advice given to you by your health care provider. Make sure you discuss any questions you have with your health care provider. Document Released: 12/14/2015 Document Revised: 08/06/2016 Document Reviewed: 09/18/2015 Elsevier Interactive Patient Education  2017 [place] Prevention in the Home Falls can cause injuries. They can happen to people of all ages. There are many things you can do to make your home safe and to help prevent falls. What can I do on the outside of my home? Regularly fix the edges of walkways and driveways and fix any cracks. Remove anything that might make you trip as you walk through a door, such as a raised step or threshold. Trim any bushes or trees on the path to your home. Use bright outdoor lighting. Clear any walking paths of anything that might make someone trip, such as rocks or tools. Regularly check to see if handrails are loose or broken. Make sure that both sides of any steps have handrails. Any raised decks and porches should have guardrails on the edges. Have any leaves, snow, or ice cleared regularly. Use sand or salt on walking paths during winter. Clean up any spills in your garage right away. This includes oil or grease spills. What can I do in the bathroom? Use night lights. Install grab bars by the toilet and in the tub and shower. Do not use towel bars as grab bars. Use non-skid mats or decals in the tub or shower. If you need to sit down in  the shower, use a plastic, non-slip stool. Keep the floor dry. Clean up any water that spills on the floor as soon as it happens. Remove soap buildup in the tub or shower regularly. Attach bath mats securely with double-sided non-slip rug tape. Do not have throw rugs and other things on the floor that can make you trip. What can I do in the bedroom? Use night lights. Make sure that you have a light by your bed that is easy to reach. Do not use any sheets or blankets that are too big for your bed. They should not hang down onto the floor. Have a firm chair that has side arms. You can use this for support while you get dressed. Do not have throw rugs and other things on the floor that can make you trip. What can I do in the kitchen? Clean up any spills right away. Avoid walking on wet floors. Keep items that you use a lot in easy-to-reach places. If you need to reach something above you, use a strong step stool that has a grab bar. Keep electrical cords out of the way. Do not use floor polish or wax that makes floors slippery. If you must use wax, use non-skid floor wax. Do not have throw rugs and other things on the floor that can make you trip. What can I do with my stairs? Do not leave any  items on the stairs. Make sure that there are handrails on both sides of the stairs and use them. Fix handrails that are broken or loose. Make sure that handrails are as long as the stairways. Check any carpeting to make sure that it is firmly attached to the stairs. Fix any carpet that is loose or worn. Avoid having throw rugs at the top or bottom of the stairs. If you do have throw rugs, attach them to the floor with carpet tape. Make sure that you have a light switch at the top of the stairs and the bottom of the stairs. If you do not have them, ask someone to add them for you. What else can I do to help prevent falls? Wear shoes that: Do not have high heels. Have rubber bottoms. Are comfortable  and fit you well. Are closed at the toe. Do not wear sandals. If you use a stepladder: Make sure that it is fully opened. Do not climb a closed stepladder. Make sure that both sides of the stepladder are locked into place. Ask someone to hold it for you, if possible. Clearly mark and make sure that you can see: Any grab bars or handrails. First and last steps. Where the edge of each step is. Use tools that help you move around (mobility aids) if they are needed. These include: Canes. Walkers. Scooters. Crutches. Turn on the lights when you go into a dark area. Replace any light bulbs as soon as they burn out. Set up your furniture so you have a clear path. Avoid moving your furniture around. If any of your floors are uneven, fix them. If there are any pets around you, be aware of where they are. Review your medicines with your doctor. Some medicines can make you feel dizzy. This can increase your chance of falling. Ask your doctor what other things that you can do to help prevent falls. This information is not intended to replace advice given to you by your health care provider. Make sure you discuss any questions you have with your health care provider. Document Released: 09/13/2009 Document Revised: 04/24/2016 Document Reviewed: 12/22/2014 Elsevier Interactive Patient Education  2017 [employer].

## 2021-10-08 NOTE — Progress Notes (Signed)
Subjective:   [name] is a 75 y.o. female who presents for Medicare Annual (Subsequent) preventive examination.  Virtual Visit via Telephone Note  I connected with  [name] on 10/08/21 at 10:30 AM EST by telephone and verified that I am speaking with the correct person using two identifiers.  Location: Patient: Home Provider: [facility] Persons participating in the virtual visit: patient/Nurse Health Advisor   I discussed the limitations, risks, security and privacy concerns of performing an evaluation and management service by telephone and the availability of in person appointments. The patient expressed understanding and agreed to proceed.  Interactive audio and video telecommunications were attempted between this nurse and patient, however failed, due to patient having technical difficulties OR patient did not have access to video capability.  We continued and completed visit with audio only.  Some vital signs may be absent or patient reported.   Raylon Lamson E Kinnie Kaupp, LPN   Review of Systems     Cardiac Risk Factors include: advanced age (>51mn, >>47women);sedentary lifestyle;obesity (BMI >30kg/m2);diabetes mellitus;dyslipidemia;hypertension;Other (see comment), Risk factor comments: atherosclerosis, OSA, hx of TIA, CHF     Objective:    Today's Vitals   10/08/21 1041 10/08/21 1042  Weight: 274 lb (124.3 kg)   Height: 5' 5"  (1.651 m)   PainSc:  5    Body mass index is 45.6 kg/m.  Advanced Directives 10/08/2021 05/18/2020 10/12/2019 06/30/2019 06/22/2019 03/08/2018 11/20/2016  Does Patient Have a Medical Advance Directive? No No No No No No No  Would patient like information on creating a medical advance directive? No - Patient declined - Yes (Inpatient - patient requests chaplain consult to create a medical advance directive) Yes (MAU/Ambulatory/Procedural Areas - Information given) No - Patient declined Yes (MAU/Ambulatory/Procedural Areas - Information given) Yes  (MAU/Ambulatory/Procedural Areas - Information given)  Pre-existing out of facility DNR order (yellow form or pink MOST form) - - - - - - -    Current Medications (verified) Outpatient Encounter Medications as of 10/08/2021  Medication Sig   Accu-Chek Softclix Lancets lancets Check BS BID Dx E11.9   acetaminophen (TYLENOL) 500 MG tablet Take 500 mg by mouth every 4 (four) hours as needed for mild pain.    albuterol (PROVENTIL) (2.5 MG/3ML) 0.083% nebulizer solution INHALE THE CONTENTS OF 1 VIAL VIA NEBULIZER EVERY 6 HOURS AS NEEDED FOR WHEEZING OR SHORTNESS OF BREATH   albuterol (VENTOLIN HFA) 108 (90 Base) MCG/ACT inhaler INHALE 1 PUFF BY MOUTH EVERY 6 HOURS AS NEEDED FOR SHORTNESS OF BREATH   apixaban (ELIQUIS) 5 MG TABS tablet TAKE ONE TABLET BY MOUTH TWICE DAILY   Blood Glucose Monitoring Suppl (ACCU-CHEK GUIDE) w/Device KIT Check BS 2 times daily Dx E11.9   Catheters (CATHETER RED RUBBER COATED) MISC Pure Wick brand is preferred. Use every 4 hours as needed for urinary incontinence.   diclofenac Sodium (VOLTAREN) 1 % GEL    diltiazem (TIAZAC) 120 MG 24 hr capsule Take by mouth.   gabapentin (NEURONTIN) 300 MG capsule Take 1 capsule (300 mg total) by mouth at bedtime. (NEEDS TO BE SEEN BEFORE NEXT REFILL)   GLOBAL EASE INJECT PEN NEEDLES 32G X 4 MM MISC    glucose blood (ACCU-CHEK GUIDE) test strip Check BS BID Dx E11.9   ipratropium-albuterol (DUONEB) 0.5-2.5 (3) MG/3ML SOLN INHALE THE CONTENTS OF 1 VIAL VIA NEBULIZER EVERY 6 HOURS AS NEEDED FOR SHORTNESS OF BREATH   levothyroxine (SYNTHROID) 50 MCG tablet TAKE ONE TABLET BY MOUTH DAILY   midodrine (PROAMATINE)  10 MG tablet TAKE ONE TABLET BY MOUTH THREE TIMES DAILY. NEEDS APPOINTMENT FOR FURTHER REFILLS.   nystatin cream (MYCOSTATIN) Apply 1 application topically 2 (two) times daily.   ondansetron (ZOFRAN-ODT) 8 MG disintegrating tablet Take 1 tablet (8 mg total) by mouth every 6 (six) hours as needed for nausea or vomiting.    polyethylene glycol (MIRALAX / GLYCOLAX) 17 g packet Take 17 g by mouth 2 (two) times daily.   potassium chloride SA (KLOR-CON) 20 MEQ tablet TAKE ONE TABLET BY MOUTH DAILY   promethazine (PHENERGAN) 25 MG tablet    rivastigmine (EXELON) 4.6 mg/24hr Place 1 patch (4.6 mg total) onto the skin daily.   sildenafil (REVATIO) 20 MG tablet Take 2 tablets (40 mg total) by mouth 3 (three) times daily.   spironolactone (ALDACTONE) 25 MG tablet Take 0.5 tablets (12.5 mg total) by mouth daily.   sulfamethoxazole-trimethoprim (BACTRIM DS) 800-160 MG tablet Take 1 tablet by mouth 2 (two) times daily.   torsemide (DEMADEX) 20 MG tablet Take 1 tablet (20 mg total) by mouth daily.   traZODone (DESYREL) 50 MG tablet Take 50 mg by mouth at bedtime.   vitamin B-12 (CYANOCOBALAMIN) 500 MCG tablet Take 500 mcg by mouth daily.   No facility-administered encounter medications on file as of 10/08/2021.    Allergies (verified) Lisinopril and Codeine   History: Past Medical History:  Diagnosis Date   Acute on chronic diastolic (congestive) heart failure ([place]) 10/12/2019   Anemia    Arthritis    Asthma    Atrial fibrillation (HCC)    CAD (coronary artery disease)    LAD 60% proximal stenosis mid and distal 60% stenosis 2009.   Cataract    had surgery   CHF (congestive heart failure) (HCC)    COPD (chronic obstructive pulmonary disease) (HCC)    Diabetes mellitus    Gait instability    uses cane   GERD (gastroesophageal reflux disease)    Hyperlipidemia    Hypertension    Obesity    Oxygen dependent    2 liter per nasal cannula   Sleep apnea, obstructive 2008   Stroke ([place])    2 mini   Past Surgical History:  Procedure Laterality Date   APPENDECTOMY     CARPAL TUNNEL RELEASE     right hand   CATARACT EXTRACTION W/ INTRAOCULAR LENS  IMPLANT, BILATERAL     CERVICAL SPINE SURGERY     fusion   CESAREAN SECTION     CHOLECYSTECTOMY     COLONOSCOPY  11/22/2002   normal - Dr.Rrourk   COLONOSCOPY   11/26/2005   incomplete with negative BE (Dr. Rowe Pavy, [facility])   [place] IM NAIL Left 05/08/2013   Procedure: INTRAMEDULLARY (IM) NAIL FEMORAL--LONG(BIOMET SYSTEM) and Zimmer Cables;  Surgeon: Augustin Schooling, MD;  Location: [place];  Service: Orthopedics;  Laterality: Left;   HIP SURGERY Left    KNEE ARTHROSCOPY     twice, left   RIGHT HEART CATH N/A 10/13/2019   Procedure: Luiz Blare Placement;  Surgeon: Jolaine Artist, MD;  Location: [place] [facility];  Service: Cardiovascular;  Laterality: N/A;   UMBILICAL HERNIA REPAIR     UPPER GASTROINTESTINAL ENDOSCOPY  11/22/2002   Normal - Dr. Gala Romney   Family History  Problem Relation Age of Onset   Colon cancer Mother    Heart attack Mother    Alzheimer's disease Mother        father   Diabetes Mother  Heart disease Mother    Cancer Mother        colon   Kidney disease Mother    Cancer Sister        brain and spine   Migraines Sister    Alzheimer's disease Father    Cancer Brother        stomach   Diabetes Sister    Cancer Sister        breast   Heart disease Sister    Heart attack Sister    Breast cancer Sister    Heart disease Brother    Heart murmur Brother    Diabetes Other    Heart attack Other    Diabetes Daughter    Asthma Daughter    Asthma Grandchild    Social History   Socioeconomic History   Marital status: Widowed    Spouse name: Not on file   Number of children: 1   Years of education: 7th   Highest education level: 7th grade  Occupational History   Occupation: Disabled   Occupation: Retired    Comment: Herbalist  Tobacco Use   Smoking status: Never   Smokeless tobacco: Never  Vaping Use   Vaping Use: Never used  Substance and Sexual Activity   Alcohol use: No   Drug use: No   Sexual activity: Not Currently  Other Topics Concern   Not on file  Social History Narrative   She is bedridden -lives with her granddaughter and her husband   Social Determinants of Adult nurse Strain: Not on file  Food Insecurity: Not on file  Transportation Needs: Not on file  Physical Activity: Not on file  Stress: No Stress Concern Present   Feeling of Stress : Not at all  Social Connections: Not on file    Tobacco Counseling Counseling given: Not Answered   Clinical Intake:  Pre-visit preparation completed: Yes  Pain : 0-10 Pain Score: 5  Pain Type: Acute pain Pain Location: Teeth Pain Orientation: Right Pain Descriptors / Indicators: Aching Pain Onset: More than a month ago Pain Frequency: Intermittent     BMI - recorded: 45.6 Nutritional Status: BMI > 30  Obese Diabetes: Yes CBG done?: No Did pt. bring in CBG monitor from home?: No  How often do you need to have someone help you when you read instructions, pamphlets, or other written materials from your doctor or pharmacy?: 2 - Rarely  Diabetic? Yes Nutrition Risk Assessment:  Has the patient had any N/V/D within the last 2 months?  No  Does the patient have any non-healing wounds?  Yes  sacral ulcer -very slowly healing - has home health nurse and family keeps an eye on it and dresses it Has the patient had any unintentional weight loss or weight gain?  No   Diabetes:  Is the patient diabetic?  Yes  If diabetic, was a CBG obtained today?  No  Did the patient bring in their glucometer from home?  No  How often do you monitor your CBG's? Once every few days.   Financial Strains and Diabetes Management:  Are you having any financial strains with the device, your supplies or your medication? No .  Does the patient want to be seen by Chronic Care Management for management of their diabetes?  No  Would the patient like to be referred to a Nutritionist or for Diabetic Management?  No   Diabetic Exams:  Diabetic Eye Exam: Completed 07/14/2019. Overdue  for diabetic eye exam. Pt has been advised about the importance in completing this exam. She declines referral as she is now bed  bound.   Diabetic Foot Exam: Completed 02/23/2018. Pt has been advised about the importance in completing this exam. She says home health nurse does these once every week or so. Interpreter Needed?: No  Information entered by :: Cordia Miklos, LPN   Activities of Daily Living In your present state of health, do you have any difficulty performing the following activities: 10/08/2021  Hearing? N  Vision? N  Difficulty concentrating or making decisions? Y  Walking or climbing stairs? Y  Dressing or bathing? Y  Doing errands, shopping? Y  Preparing Food and eating ? Y  Using the Toilet? Y  In the past six months, have you accidently leaked urine? Y  Comment wears depends - family changes her  Do you have problems with loss of bowel control? Y  Managing your Medications? Y  Managing your Finances? Y  Housekeeping or managing your Housekeeping? Y  Some recent data might be hidden    Patient Care Team: Claretta Fraise, MD as PCP - General (Family Medicine) Minus Breeding, MD as PCP - Cardiology (Cardiology) Santo Held, MD as Consulting Physician (Gynecology) Minus Breeding, MD as Consulting Physician (Cardiology)  Indicate any recent Medical Services you may have received from other than [facility] providers in the past year (date may be approximate).     Assessment:   This is a routine wellness examination for [name].  Hearing/Vision screen Hearing Screening - Comments:: Denies hearing difficulties  Vision Screening - Comments:: Wears reading glasses prn - behind on annual eye exams with [facility]  Dietary issues and exercise activities discussed: Current Exercise Habits: The patient does not participate in regular exercise at present, Exercise limited by: orthopedic condition(s);neurologic condition(s);cardiac condition(s);respiratory conditions(s);psychological condition(s)   Goals Addressed               This Visit's Progress     "We need home health and in-home  care services" (pt-stated)   On track     [place] (see longitudinal plan of care for additional care plan information)  Current Barriers:  Care Coordination needs related to [facility] scheduling in a patient with CHF, DM, CKD (disease states) Transportation barriers  Nurse Case Manager Clinical Goal(s):  Over the next 7 days, patient/daughter will talk with RN Care Manager regarding in home health care  Interventions:  Inter-disciplinary care team collaboration (see longitudinal plan of care) Chart reviewed including recent office notes, hospital discharge summary, and ED notes Previously discussed her need for in-home care and requested that [facility] provide assistance to patient/family Assured that information was passed on to [facility] and Social Worker Patient/daughter has not received a f/u call regarding this  Talked with patient's daughter [name] regarding home health They requested to be discharged from [facility] and are requesting services from [facility] upcoming appt with Dr Livia Snellen on 06/25/20 for DME order. Will need to have new HH order placed at that visit.  Discussed transportation concerns Daughter planned to call EMS for transport to appt on Monday Urgent order for transportation assistance sent to Care Management Care Guides for assistance RNCM will reach out to [place] to f/u  Collaborated with [facility] team Collaborated with [facility] clinical staff regarding the possibility of a video visit vs in person visit for DME orders Discussed video visit with patient's daughter and decided that is the  best option at this time Collaborated with [facility] Clinical staff to change appt to a video visit Advised that a link will be sent to her phone prior to the appointment and that she will use that to login Transportation referral cancelled  Patient Self Care Activities:  Self administers medications as prescribed Attends all scheduled  provider appointments Unable to independently drive to appointments Unable to perform IADLs independently  Please see past updates related to this goal by clicking on the "Past Updates" button in the selected goal        Nurse recommended        Exercises in bed - leg raises, stretches, arm lifts, sitting up/laying back, repeat, change positions Watch portion sizes, less weight would be easier to make more mobile       Depression Screen PHQ 2/9 Scores 10/08/2021 03/22/2020 03/22/2020 06/30/2019 01/03/2019 09/08/2018 07/05/2018  PHQ - 2 Score 2 3 3  0 0 0 0  PHQ- 9 Score 8 11 - - - - -    Fall Risk Fall Risk  10/08/2021 03/22/2020 06/30/2019 01/03/2019 09/08/2018  Falls in the past year? Exclusion - non ambulatory 0 0 0 No  Comment - - - - -  Number falls in past yr: 0 - 0 - -  Injury with Fall? 0 - 0 - -  Comment - - - - -  Risk Factor Category  - - - - -  Risk for fall due to : Impaired mobility;Mental status change;Medication side effect - - - -  Follow up Education provided;Falls prevention discussed - - - -    FALL RISK PREVENTION PERTAINING TO THE HOME:  Any stairs in or around the home? No  If so, are there any without handrails? No  Home free of loose throw rugs in walkways, pet beds, electrical cords, etc? Yes  Adequate lighting in your home to reduce risk of falls? Yes   ASSISTIVE DEVICES UTILIZED TO PREVENT FALLS:  Life alert? No  Use of a cane, walker or w/c? Yes  Grab bars in the bathroom? Yes  Shower chair or bench in shower? Yes  Elevated toilet seat or a handicapped toilet? Yes   TIMED UP AND GO:  Was the test performed? No . Telephonic visit  Cognitive Function: MMSE - Mini Mental State Exam 03/22/2020 03/08/2018 11/20/2016 11/19/2015  Not completed: - Unable to complete - -  Orientation to time 4 5 5 5   Orientation to Place 4 5 5 5   Registration 3 3 3 3   Attention/ Calculation 0 0 3 4  Attention/Calculation-comments - did not attempt due to literacy level - -   Recall 3 2 2 2   Language- name 2 objects 2 2 2 2   Language- repeat 1 1 1 1   Language- follow 3 step command 3 3 2 3   Language- read & follow direction 1 1 1 1   Write a sentence 1 0 1 0  Copy design 1 1 1 1   Total score 23 23 26 27      6CIT Screen 10/08/2021 06/30/2019  What Year? 0 points 0 points  What month? 3 points 3 points  What time? 0 points 0 points  Count back from 20 0 points 0 points  Months in reverse 4 points 4 points  Repeat phrase 10 points 0 points  Total Score 17 7    Immunizations Immunization History  Administered Date(s) Administered   Influenza, High Dose Seasonal PF 09/08/2018   Influenza,inj,Quad PF,6+ Mos 08/23/2013, 09/15/2014, 09/11/2015,  09/02/2016, 08/26/2017   Pneumococcal Conjugate-13 09/15/2014   Pneumococcal Polysaccharide-23 02/02/2011, 11/12/2015   Tdap 03/09/2012   Zoster, Live 09/03/2012    TDAP status: Up to date  Flu Vaccine status: Due, Education has been provided regarding the importance of this vaccine. Advised may receive this vaccine at local pharmacy or Health Dept. Aware to provide a copy of the vaccination record if obtained from local pharmacy or Health Dept. Verbalized acceptance and understanding.  Pneumococcal vaccine status: Up to date  Covid-19 vaccine status: Declined, Education has been provided regarding the importance of this vaccine but patient still declined. Advised may receive this vaccine at local pharmacy or Health Dept.or vaccine clinic. Aware to provide a copy of the vaccination record if obtained from local pharmacy or Health Dept. Verbalized acceptance and understanding.  Qualifies for Shingles Vaccine? Yes   Zostavax completed No   Shingrix Completed?: No.    Education has been provided regarding the importance of this vaccine. Patient has been advised to call insurance company to determine out of pocket expense if they have not yet received this vaccine. Advised may also receive vaccine at local pharmacy or  Health Dept. Verbalized acceptance and understanding.  Screening Tests Health Maintenance  Topic Date Due   COVID-19 Vaccine (1) Never done   Hepatitis C Screening  Never done   Zoster Vaccines- Shingrix (1 of 2) Never done   FOOT EXAM  02/24/2019   URINE MICROALBUMIN  05/29/2019   DEXA SCAN  01/22/2020   HEMOGLOBIN A1C  04/11/2020   OPHTHALMOLOGY EXAM  07/13/2020   INFLUENZA VACCINE  07/01/2021   TETANUS/TDAP  03/09/2022   Pneumonia Vaccine 4+ Years old  Completed   HPV VACCINES  Aged Out    Health Maintenance  Health Maintenance Due  Topic Date Due   COVID-19 Vaccine (1) Never done   Hepatitis C Screening  Never done   Zoster Vaccines- Shingrix (1 of 2) Never done   FOOT EXAM  02/24/2019   URINE MICROALBUMIN  05/29/2019   DEXA SCAN  01/22/2020   HEMOGLOBIN A1C  04/11/2020   OPHTHALMOLOGY EXAM  07/13/2020   INFLUENZA VACCINE  07/01/2021    Colorectal cancer screening: No longer required.   Mammogram status: No longer required due to bedridden.  Bone Density status: Completed 01/21/2018. Results reflect: Bone density results: OSTEOPENIA. Repeat every 2 years.  Lung Cancer Screening: (Low Dose CT Chest recommended if Age 53-80 years, 30 pack-year currently smoking OR have quit w/in 15years.) does not qualify.   Additional Screening:  Hepatitis C Screening: does qualify; Due  Vision Screening: Recommended annual ophthalmology exams for early detection of glaucoma and other disorders of the eye. Is the patient up to date with their annual eye exam?  No  Who is the provider or what is the name of the office in which the patient attends annual eye exams? [facility] If pt is not established with a provider, would they like to be referred to a provider to establish care? No .   Dental Screening: Recommended annual dental exams for proper oral hygiene  Community Resource Referral / Chronic Care Management: CRR required this visit?  No   CCM required this visit?   No      Plan:     I have personally reviewed and noted the following in the patient's chart:   Medical and social history Use of alcohol, tobacco or illicit drugs  Current medications and supplements including opioid prescriptions.  Functional ability and status Nutritional  status Physical activity Advanced directives List of other physicians Hospitalizations, surgeries, and ER visits in previous 12 months Vitals Screenings to include cognitive, depression, and falls Referrals and appointments  In addition, I have reviewed and discussed with patient certain preventive protocols, quality metrics, and best practice recommendations. A written personalized care plan for preventive services as well as general preventive health recommendations were provided to patient.     Sandrea Hammond, LPN   [id number]   Nurse Notes: None

## 2021-10-12 DIAGNOSIS — I4821 Permanent atrial fibrillation: Secondary | ICD-10-CM | POA: Diagnosis not present

## 2021-10-12 DIAGNOSIS — J9601 Acute respiratory failure with hypoxia: Secondary | ICD-10-CM | POA: Diagnosis not present

## 2021-10-12 DIAGNOSIS — R790 Abnormal level of blood mineral: Secondary | ICD-10-CM | POA: Diagnosis not present

## 2021-10-12 DIAGNOSIS — I519 Heart disease, unspecified: Secondary | ICD-10-CM | POA: Diagnosis not present

## 2021-10-12 DIAGNOSIS — J90 Pleural effusion, not elsewhere classified: Secondary | ICD-10-CM | POA: Diagnosis not present

## 2021-10-12 DIAGNOSIS — E876 Hypokalemia: Secondary | ICD-10-CM | POA: Diagnosis not present

## 2021-10-12 DIAGNOSIS — J449 Chronic obstructive pulmonary disease, unspecified: Secondary | ICD-10-CM | POA: Diagnosis not present

## 2021-10-12 DIAGNOSIS — I4891 Unspecified atrial fibrillation: Secondary | ICD-10-CM | POA: Diagnosis not present

## 2021-10-12 DIAGNOSIS — I272 Pulmonary hypertension, unspecified: Secondary | ICD-10-CM | POA: Diagnosis not present

## 2021-10-12 DIAGNOSIS — R42 Dizziness and giddiness: Secondary | ICD-10-CM | POA: Diagnosis not present

## 2021-10-12 DIAGNOSIS — I517 Cardiomegaly: Secondary | ICD-10-CM | POA: Diagnosis not present

## 2021-10-12 DIAGNOSIS — E039 Hypothyroidism, unspecified: Secondary | ICD-10-CM | POA: Diagnosis not present

## 2021-10-12 DIAGNOSIS — R0602 Shortness of breath: Secondary | ICD-10-CM | POA: Diagnosis not present

## 2021-10-12 DIAGNOSIS — Z792 Long term (current) use of antibiotics: Secondary | ICD-10-CM | POA: Diagnosis not present

## 2021-10-12 DIAGNOSIS — J9811 Atelectasis: Secondary | ICD-10-CM | POA: Diagnosis not present

## 2021-10-12 DIAGNOSIS — R0902 Hypoxemia: Secondary | ICD-10-CM | POA: Diagnosis not present

## 2021-10-12 DIAGNOSIS — I728 Aneurysm of other specified arteries: Secondary | ICD-10-CM | POA: Diagnosis not present

## 2021-10-12 DIAGNOSIS — R531 Weakness: Secondary | ICD-10-CM | POA: Diagnosis not present

## 2021-10-12 DIAGNOSIS — E1122 Type 2 diabetes mellitus with diabetic chronic kidney disease: Secondary | ICD-10-CM | POA: Diagnosis not present

## 2021-10-12 DIAGNOSIS — E440 Moderate protein-calorie malnutrition: Secondary | ICD-10-CM | POA: Diagnosis not present

## 2021-10-12 DIAGNOSIS — Z794 Long term (current) use of insulin: Secondary | ICD-10-CM | POA: Diagnosis not present

## 2021-10-12 DIAGNOSIS — J441 Chronic obstructive pulmonary disease with (acute) exacerbation: Secondary | ICD-10-CM | POA: Diagnosis not present

## 2021-10-12 DIAGNOSIS — N390 Urinary tract infection, site not specified: Secondary | ICD-10-CM | POA: Diagnosis not present

## 2021-10-12 DIAGNOSIS — I130 Hypertensive heart and chronic kidney disease with heart failure and stage 1 through stage 4 chronic kidney disease, or unspecified chronic kidney disease: Secondary | ICD-10-CM | POA: Diagnosis not present

## 2021-10-12 DIAGNOSIS — N281 Cyst of kidney, acquired: Secondary | ICD-10-CM | POA: Diagnosis not present

## 2021-10-12 DIAGNOSIS — I5189 Other ill-defined heart diseases: Secondary | ICD-10-CM | POA: Diagnosis not present

## 2021-10-12 DIAGNOSIS — R7989 Other specified abnormal findings of blood chemistry: Secondary | ICD-10-CM | POA: Diagnosis not present

## 2021-10-12 DIAGNOSIS — J189 Pneumonia, unspecified organism: Secondary | ICD-10-CM | POA: Diagnosis not present

## 2021-10-12 DIAGNOSIS — J9621 Acute and chronic respiratory failure with hypoxia: Secondary | ICD-10-CM | POA: Diagnosis not present

## 2021-10-12 DIAGNOSIS — R079 Chest pain, unspecified: Secondary | ICD-10-CM | POA: Diagnosis not present

## 2021-10-12 DIAGNOSIS — E119 Type 2 diabetes mellitus without complications: Secondary | ICD-10-CM | POA: Diagnosis not present

## 2021-10-12 DIAGNOSIS — Z79899 Other long term (current) drug therapy: Secondary | ICD-10-CM | POA: Diagnosis not present

## 2021-10-12 DIAGNOSIS — I5033 Acute on chronic diastolic (congestive) heart failure: Secondary | ICD-10-CM | POA: Diagnosis not present

## 2021-10-12 DIAGNOSIS — I10 Essential (primary) hypertension: Secondary | ICD-10-CM | POA: Diagnosis not present

## 2021-10-21 ENCOUNTER — Telehealth: Payer: Self-pay | Admitting: *Deleted

## 2021-10-21 ENCOUNTER — Inpatient Hospital Stay: Payer: Medicare HMO | Admitting: Family Medicine

## 2021-10-21 DIAGNOSIS — I519 Heart disease, unspecified: Secondary | ICD-10-CM | POA: Diagnosis not present

## 2021-10-21 DIAGNOSIS — J9811 Atelectasis: Secondary | ICD-10-CM | POA: Diagnosis not present

## 2021-10-21 DIAGNOSIS — I509 Heart failure, unspecified: Secondary | ICD-10-CM | POA: Diagnosis not present

## 2021-10-21 DIAGNOSIS — I5023 Acute on chronic systolic (congestive) heart failure: Secondary | ICD-10-CM | POA: Diagnosis not present

## 2021-10-21 DIAGNOSIS — E119 Type 2 diabetes mellitus without complications: Secondary | ICD-10-CM | POA: Diagnosis not present

## 2021-10-21 DIAGNOSIS — I130 Hypertensive heart and chronic kidney disease with heart failure and stage 1 through stage 4 chronic kidney disease, or unspecified chronic kidney disease: Secondary | ICD-10-CM | POA: Diagnosis not present

## 2021-10-21 DIAGNOSIS — R279 Unspecified lack of coordination: Secondary | ICD-10-CM | POA: Diagnosis not present

## 2021-10-21 DIAGNOSIS — J9621 Acute and chronic respiratory failure with hypoxia: Secondary | ICD-10-CM | POA: Diagnosis not present

## 2021-10-21 DIAGNOSIS — R069 Unspecified abnormalities of breathing: Secondary | ICD-10-CM | POA: Diagnosis not present

## 2021-10-21 DIAGNOSIS — A419 Sepsis, unspecified organism: Secondary | ICD-10-CM | POA: Diagnosis not present

## 2021-10-21 DIAGNOSIS — D72829 Elevated white blood cell count, unspecified: Secondary | ICD-10-CM | POA: Diagnosis not present

## 2021-10-21 DIAGNOSIS — I270 Primary pulmonary hypertension: Secondary | ICD-10-CM | POA: Diagnosis not present

## 2021-10-21 DIAGNOSIS — Z7401 Bed confinement status: Secondary | ICD-10-CM | POA: Diagnosis not present

## 2021-10-21 DIAGNOSIS — Z7901 Long term (current) use of anticoagulants: Secondary | ICD-10-CM | POA: Diagnosis not present

## 2021-10-21 DIAGNOSIS — R000 Tachycardia, unspecified: Secondary | ICD-10-CM | POA: Diagnosis not present

## 2021-10-21 DIAGNOSIS — J9602 Acute respiratory failure with hypercapnia: Secondary | ICD-10-CM | POA: Diagnosis not present

## 2021-10-21 DIAGNOSIS — I2781 Cor pulmonale (chronic): Secondary | ICD-10-CM | POA: Diagnosis not present

## 2021-10-21 DIAGNOSIS — J9601 Acute respiratory failure with hypoxia: Secondary | ICD-10-CM | POA: Diagnosis not present

## 2021-10-21 DIAGNOSIS — J9622 Acute and chronic respiratory failure with hypercapnia: Secondary | ICD-10-CM | POA: Diagnosis not present

## 2021-10-21 DIAGNOSIS — T83511A Infection and inflammatory reaction due to indwelling urethral catheter, initial encounter: Secondary | ICD-10-CM | POA: Diagnosis not present

## 2021-10-21 DIAGNOSIS — I517 Cardiomegaly: Secondary | ICD-10-CM | POA: Diagnosis not present

## 2021-10-21 DIAGNOSIS — R0902 Hypoxemia: Secondary | ICD-10-CM | POA: Diagnosis not present

## 2021-10-21 DIAGNOSIS — Z66 Do not resuscitate: Secondary | ICD-10-CM | POA: Diagnosis not present

## 2021-10-21 DIAGNOSIS — B9620 Unspecified Escherichia coli [E. coli] as the cause of diseases classified elsewhere: Secondary | ICD-10-CM | POA: Diagnosis not present

## 2021-10-21 DIAGNOSIS — J811 Chronic pulmonary edema: Secondary | ICD-10-CM | POA: Diagnosis not present

## 2021-10-21 DIAGNOSIS — R4182 Altered mental status, unspecified: Secondary | ICD-10-CM | POA: Diagnosis not present

## 2021-10-21 DIAGNOSIS — R52 Pain, unspecified: Secondary | ICD-10-CM | POA: Diagnosis not present

## 2021-10-21 DIAGNOSIS — J90 Pleural effusion, not elsewhere classified: Secondary | ICD-10-CM | POA: Diagnosis not present

## 2021-10-21 DIAGNOSIS — I4821 Permanent atrial fibrillation: Secondary | ICD-10-CM | POA: Diagnosis not present

## 2021-10-21 DIAGNOSIS — I272 Pulmonary hypertension, unspecified: Secondary | ICD-10-CM | POA: Diagnosis not present

## 2021-10-21 DIAGNOSIS — N390 Urinary tract infection, site not specified: Secondary | ICD-10-CM | POA: Diagnosis not present

## 2021-10-21 DIAGNOSIS — R0689 Other abnormalities of breathing: Secondary | ICD-10-CM | POA: Diagnosis not present

## 2021-10-21 DIAGNOSIS — B9689 Other specified bacterial agents as the cause of diseases classified elsewhere: Secondary | ICD-10-CM | POA: Diagnosis not present

## 2021-10-21 NOTE — Telephone Encounter (Signed)
TC to [facility] nurse to have pt go back to ED I called daughter, pt was en route back to the ED d/t her breathing, and we discussed that she might be admitted for it and treatment for her UTI since her urine output was inadequate

## 2021-10-21 NOTE — Telephone Encounter (Signed)
Olin Hauser w/ [facility] She saw pt yesterday for resumption of care after hospitalization for pneumonia & uti Pt has bilateral edema in both arms, had only 500 ml urine output in 24 hrs w/ amber urine Family says she is eating [place] nurse is not sure she is, she is usually talkative from her visits w/ her before, she answer questions appropriate but just kinda of malaise, she is in chronic pain, only wants to lay on her right side Hospital DCd potassium, fluid pill & gabapentin, started her on Jardiance Does not have a glucometer at home, informed [facility] nurse that the family needs to go to [facility] & pick it up I sent this the middle of Oct She does not have her Levothyroxine at home, in looking at her chart last RF was back in Feb for a 3 mos supply & last labwork was done here 10/2019 but was done at [facility] 10/12/21 w/ result of 8.224, with no note on discharge on change in dose.  Pt has appt today w/ [facility] for hospital follow-up, she needs a thorough med reconciliation. [place] [facility] nurse would like a VO to add speech therapy & physical therapy.

## 2021-10-21 NOTE — Telephone Encounter (Signed)
If she has really only had 500 ml of urine out in the last 24 hours, she needs to go to the the ER for concern for dehydration, obstruction, inadequately treated UTI etc.

## 2021-10-22 ENCOUNTER — Encounter: Payer: Self-pay | Admitting: Family Medicine

## 2021-10-22 DIAGNOSIS — J9622 Acute and chronic respiratory failure with hypercapnia: Secondary | ICD-10-CM | POA: Diagnosis not present

## 2021-10-22 DIAGNOSIS — J90 Pleural effusion, not elsewhere classified: Secondary | ICD-10-CM | POA: Diagnosis not present

## 2021-10-22 DIAGNOSIS — I519 Heart disease, unspecified: Secondary | ICD-10-CM | POA: Diagnosis not present

## 2021-10-22 DIAGNOSIS — I517 Cardiomegaly: Secondary | ICD-10-CM | POA: Diagnosis not present

## 2021-10-22 DIAGNOSIS — B9689 Other specified bacterial agents as the cause of diseases classified elsewhere: Secondary | ICD-10-CM | POA: Diagnosis not present

## 2021-10-22 DIAGNOSIS — I272 Pulmonary hypertension, unspecified: Secondary | ICD-10-CM | POA: Diagnosis not present

## 2021-10-22 DIAGNOSIS — J9621 Acute and chronic respiratory failure with hypoxia: Secondary | ICD-10-CM | POA: Diagnosis not present

## 2021-10-22 DIAGNOSIS — I4821 Permanent atrial fibrillation: Secondary | ICD-10-CM | POA: Diagnosis not present

## 2021-10-22 DIAGNOSIS — D72829 Elevated white blood cell count, unspecified: Secondary | ICD-10-CM | POA: Diagnosis not present

## 2021-10-23 DIAGNOSIS — I272 Pulmonary hypertension, unspecified: Secondary | ICD-10-CM | POA: Diagnosis not present

## 2021-10-23 DIAGNOSIS — B9689 Other specified bacterial agents as the cause of diseases classified elsewhere: Secondary | ICD-10-CM | POA: Diagnosis not present

## 2021-10-23 DIAGNOSIS — I519 Heart disease, unspecified: Secondary | ICD-10-CM | POA: Diagnosis not present

## 2021-10-23 DIAGNOSIS — I517 Cardiomegaly: Secondary | ICD-10-CM | POA: Diagnosis not present

## 2021-10-23 DIAGNOSIS — D72829 Elevated white blood cell count, unspecified: Secondary | ICD-10-CM | POA: Diagnosis not present

## 2021-10-23 DIAGNOSIS — I4821 Permanent atrial fibrillation: Secondary | ICD-10-CM | POA: Diagnosis not present

## 2021-10-23 DIAGNOSIS — J90 Pleural effusion, not elsewhere classified: Secondary | ICD-10-CM | POA: Diagnosis not present

## 2021-10-23 DIAGNOSIS — J9622 Acute and chronic respiratory failure with hypercapnia: Secondary | ICD-10-CM | POA: Diagnosis not present

## 2021-10-23 DIAGNOSIS — J9621 Acute and chronic respiratory failure with hypoxia: Secondary | ICD-10-CM | POA: Diagnosis not present

## 2021-10-24 DIAGNOSIS — I519 Heart disease, unspecified: Secondary | ICD-10-CM | POA: Diagnosis not present

## 2021-10-24 DIAGNOSIS — J90 Pleural effusion, not elsewhere classified: Secondary | ICD-10-CM | POA: Diagnosis not present

## 2021-10-24 DIAGNOSIS — J9622 Acute and chronic respiratory failure with hypercapnia: Secondary | ICD-10-CM | POA: Diagnosis not present

## 2021-10-24 DIAGNOSIS — D72829 Elevated white blood cell count, unspecified: Secondary | ICD-10-CM | POA: Diagnosis not present

## 2021-10-24 DIAGNOSIS — I272 Pulmonary hypertension, unspecified: Secondary | ICD-10-CM | POA: Diagnosis not present

## 2021-10-24 DIAGNOSIS — B9689 Other specified bacterial agents as the cause of diseases classified elsewhere: Secondary | ICD-10-CM | POA: Diagnosis not present

## 2021-10-24 DIAGNOSIS — I517 Cardiomegaly: Secondary | ICD-10-CM | POA: Diagnosis not present

## 2021-10-24 DIAGNOSIS — I4821 Permanent atrial fibrillation: Secondary | ICD-10-CM | POA: Diagnosis not present

## 2021-10-24 DIAGNOSIS — J9621 Acute and chronic respiratory failure with hypoxia: Secondary | ICD-10-CM | POA: Diagnosis not present

## 2021-10-25 DIAGNOSIS — I272 Pulmonary hypertension, unspecified: Secondary | ICD-10-CM | POA: Diagnosis not present

## 2021-10-25 DIAGNOSIS — J9621 Acute and chronic respiratory failure with hypoxia: Secondary | ICD-10-CM | POA: Diagnosis not present

## 2021-10-25 DIAGNOSIS — I519 Heart disease, unspecified: Secondary | ICD-10-CM | POA: Diagnosis not present

## 2021-10-25 DIAGNOSIS — Z7401 Bed confinement status: Secondary | ICD-10-CM | POA: Diagnosis not present

## 2021-10-25 DIAGNOSIS — J811 Chronic pulmonary edema: Secondary | ICD-10-CM | POA: Diagnosis not present

## 2021-10-25 DIAGNOSIS — R279 Unspecified lack of coordination: Secondary | ICD-10-CM | POA: Diagnosis not present

## 2021-10-25 DIAGNOSIS — I4821 Permanent atrial fibrillation: Secondary | ICD-10-CM | POA: Diagnosis not present

## 2021-10-25 DIAGNOSIS — J9622 Acute and chronic respiratory failure with hypercapnia: Secondary | ICD-10-CM | POA: Diagnosis not present

## 2021-10-25 DIAGNOSIS — E119 Type 2 diabetes mellitus without complications: Secondary | ICD-10-CM | POA: Diagnosis not present

## 2021-10-25 DIAGNOSIS — I509 Heart failure, unspecified: Secondary | ICD-10-CM | POA: Diagnosis not present

## 2021-10-25 DIAGNOSIS — J9811 Atelectasis: Secondary | ICD-10-CM | POA: Diagnosis not present

## 2021-10-25 DIAGNOSIS — N390 Urinary tract infection, site not specified: Secondary | ICD-10-CM | POA: Diagnosis not present

## 2021-10-25 DIAGNOSIS — I517 Cardiomegaly: Secondary | ICD-10-CM | POA: Diagnosis not present

## 2021-10-25 DIAGNOSIS — R52 Pain, unspecified: Secondary | ICD-10-CM | POA: Diagnosis not present

## 2021-10-25 DIAGNOSIS — J90 Pleural effusion, not elsewhere classified: Secondary | ICD-10-CM | POA: Diagnosis not present

## 2021-10-25 DIAGNOSIS — I2781 Cor pulmonale (chronic): Secondary | ICD-10-CM | POA: Diagnosis not present

## 2021-10-25 DIAGNOSIS — R0902 Hypoxemia: Secondary | ICD-10-CM | POA: Diagnosis not present

## 2021-10-27 ENCOUNTER — Emergency Department (HOSPITAL_COMMUNITY): Payer: Medicare HMO

## 2021-10-27 ENCOUNTER — Observation Stay (HOSPITAL_COMMUNITY): Payer: Medicare HMO

## 2021-10-27 ENCOUNTER — Other Ambulatory Visit: Payer: Self-pay

## 2021-10-27 ENCOUNTER — Inpatient Hospital Stay (HOSPITAL_COMMUNITY)
Admission: EM | Admit: 2021-10-27 | Discharge: 2021-12-01 | DRG: 291 | Disposition: E | Payer: Medicare HMO | Attending: Internal Medicine | Admitting: Internal Medicine

## 2021-10-27 ENCOUNTER — Encounter (HOSPITAL_COMMUNITY): Payer: Self-pay | Admitting: Internal Medicine

## 2021-10-27 DIAGNOSIS — I2781 Cor pulmonale (chronic): Secondary | ICD-10-CM | POA: Diagnosis present

## 2021-10-27 DIAGNOSIS — E039 Hypothyroidism, unspecified: Secondary | ICD-10-CM | POA: Diagnosis present

## 2021-10-27 DIAGNOSIS — Z20822 Contact with and (suspected) exposure to covid-19: Secondary | ICD-10-CM | POA: Diagnosis present

## 2021-10-27 DIAGNOSIS — I272 Pulmonary hypertension, unspecified: Secondary | ICD-10-CM | POA: Diagnosis present

## 2021-10-27 DIAGNOSIS — Z9841 Cataract extraction status, right eye: Secondary | ICD-10-CM

## 2021-10-27 DIAGNOSIS — E876 Hypokalemia: Secondary | ICD-10-CM | POA: Diagnosis present

## 2021-10-27 DIAGNOSIS — D649 Anemia, unspecified: Secondary | ICD-10-CM | POA: Diagnosis present

## 2021-10-27 DIAGNOSIS — I110 Hypertensive heart disease with heart failure: Secondary | ICD-10-CM | POA: Diagnosis not present

## 2021-10-27 DIAGNOSIS — Z6841 Body Mass Index (BMI) 40.0 and over, adult: Secondary | ICD-10-CM | POA: Diagnosis not present

## 2021-10-27 DIAGNOSIS — Z79890 Hormone replacement therapy (postmenopausal): Secondary | ICD-10-CM

## 2021-10-27 DIAGNOSIS — Z833 Family history of diabetes mellitus: Secondary | ICD-10-CM

## 2021-10-27 DIAGNOSIS — J441 Chronic obstructive pulmonary disease with (acute) exacerbation: Secondary | ICD-10-CM | POA: Diagnosis present

## 2021-10-27 DIAGNOSIS — Z7901 Long term (current) use of anticoagulants: Secondary | ICD-10-CM

## 2021-10-27 DIAGNOSIS — Z9981 Dependence on supplemental oxygen: Secondary | ICD-10-CM

## 2021-10-27 DIAGNOSIS — I890 Lymphedema, not elsewhere classified: Secondary | ICD-10-CM | POA: Diagnosis present

## 2021-10-27 DIAGNOSIS — M1990 Unspecified osteoarthritis, unspecified site: Secondary | ICD-10-CM | POA: Diagnosis present

## 2021-10-27 DIAGNOSIS — I2510 Atherosclerotic heart disease of native coronary artery without angina pectoris: Secondary | ICD-10-CM | POA: Diagnosis present

## 2021-10-27 DIAGNOSIS — Z9049 Acquired absence of other specified parts of digestive tract: Secondary | ICD-10-CM

## 2021-10-27 DIAGNOSIS — Z66 Do not resuscitate: Secondary | ICD-10-CM | POA: Diagnosis not present

## 2021-10-27 DIAGNOSIS — I5082 Biventricular heart failure: Secondary | ICD-10-CM | POA: Diagnosis present

## 2021-10-27 DIAGNOSIS — R0602 Shortness of breath: Secondary | ICD-10-CM | POA: Diagnosis not present

## 2021-10-27 DIAGNOSIS — I4891 Unspecified atrial fibrillation: Secondary | ICD-10-CM | POA: Diagnosis not present

## 2021-10-27 DIAGNOSIS — Z825 Family history of asthma and other chronic lower respiratory diseases: Secondary | ICD-10-CM

## 2021-10-27 DIAGNOSIS — Z7189 Other specified counseling: Secondary | ICD-10-CM | POA: Diagnosis not present

## 2021-10-27 DIAGNOSIS — I5033 Acute on chronic diastolic (congestive) heart failure: Secondary | ICD-10-CM | POA: Diagnosis present

## 2021-10-27 DIAGNOSIS — I4821 Permanent atrial fibrillation: Secondary | ICD-10-CM | POA: Diagnosis present

## 2021-10-27 DIAGNOSIS — L89152 Pressure ulcer of sacral region, stage 2: Secondary | ICD-10-CM | POA: Diagnosis present

## 2021-10-27 DIAGNOSIS — G4733 Obstructive sleep apnea (adult) (pediatric): Secondary | ICD-10-CM | POA: Diagnosis present

## 2021-10-27 DIAGNOSIS — I959 Hypotension, unspecified: Secondary | ICD-10-CM | POA: Diagnosis not present

## 2021-10-27 DIAGNOSIS — B372 Candidiasis of skin and nail: Secondary | ICD-10-CM | POA: Diagnosis present

## 2021-10-27 DIAGNOSIS — I509 Heart failure, unspecified: Secondary | ICD-10-CM | POA: Diagnosis not present

## 2021-10-27 DIAGNOSIS — R069 Unspecified abnormalities of breathing: Secondary | ICD-10-CM | POA: Diagnosis not present

## 2021-10-27 DIAGNOSIS — Z961 Presence of intraocular lens: Secondary | ICD-10-CM | POA: Diagnosis present

## 2021-10-27 DIAGNOSIS — L89226 Pressure-induced deep tissue damage of left hip: Secondary | ICD-10-CM | POA: Diagnosis present

## 2021-10-27 DIAGNOSIS — I130 Hypertensive heart and chronic kidney disease with heart failure and stage 1 through stage 4 chronic kidney disease, or unspecified chronic kidney disease: Secondary | ICD-10-CM | POA: Diagnosis not present

## 2021-10-27 DIAGNOSIS — L89896 Pressure-induced deep tissue damage of other site: Secondary | ICD-10-CM | POA: Diagnosis present

## 2021-10-27 DIAGNOSIS — J9621 Acute and chronic respiratory failure with hypoxia: Secondary | ICD-10-CM | POA: Diagnosis not present

## 2021-10-27 DIAGNOSIS — Z8673 Personal history of transient ischemic attack (TIA), and cerebral infarction without residual deficits: Secondary | ICD-10-CM

## 2021-10-27 DIAGNOSIS — J9622 Acute and chronic respiratory failure with hypercapnia: Secondary | ICD-10-CM | POA: Diagnosis not present

## 2021-10-27 DIAGNOSIS — Z7401 Bed confinement status: Secondary | ICD-10-CM

## 2021-10-27 DIAGNOSIS — Z515 Encounter for palliative care: Secondary | ICD-10-CM | POA: Diagnosis not present

## 2021-10-27 DIAGNOSIS — J9601 Acute respiratory failure with hypoxia: Secondary | ICD-10-CM | POA: Diagnosis not present

## 2021-10-27 DIAGNOSIS — E785 Hyperlipidemia, unspecified: Secondary | ICD-10-CM | POA: Diagnosis present

## 2021-10-27 DIAGNOSIS — K219 Gastro-esophageal reflux disease without esophagitis: Secondary | ICD-10-CM | POA: Diagnosis present

## 2021-10-27 DIAGNOSIS — G9340 Encephalopathy, unspecified: Secondary | ICD-10-CM | POA: Diagnosis present

## 2021-10-27 DIAGNOSIS — R1110 Vomiting, unspecified: Secondary | ICD-10-CM

## 2021-10-27 DIAGNOSIS — I5032 Chronic diastolic (congestive) heart failure: Secondary | ICD-10-CM | POA: Diagnosis not present

## 2021-10-27 DIAGNOSIS — Z841 Family history of disorders of kidney and ureter: Secondary | ICD-10-CM

## 2021-10-27 DIAGNOSIS — Z7984 Long term (current) use of oral hypoglycemic drugs: Secondary | ICD-10-CM

## 2021-10-27 DIAGNOSIS — E1122 Type 2 diabetes mellitus with diabetic chronic kidney disease: Secondary | ICD-10-CM | POA: Diagnosis present

## 2021-10-27 DIAGNOSIS — Z79899 Other long term (current) drug therapy: Secondary | ICD-10-CM

## 2021-10-27 DIAGNOSIS — I482 Chronic atrial fibrillation, unspecified: Secondary | ICD-10-CM | POA: Diagnosis present

## 2021-10-27 DIAGNOSIS — L89159 Pressure ulcer of sacral region, unspecified stage: Secondary | ICD-10-CM | POA: Diagnosis present

## 2021-10-27 DIAGNOSIS — Z978 Presence of other specified devices: Secondary | ICD-10-CM

## 2021-10-27 DIAGNOSIS — Z9842 Cataract extraction status, left eye: Secondary | ICD-10-CM

## 2021-10-27 DIAGNOSIS — R52 Pain, unspecified: Secondary | ICD-10-CM | POA: Diagnosis not present

## 2021-10-27 DIAGNOSIS — R0689 Other abnormalities of breathing: Secondary | ICD-10-CM | POA: Diagnosis not present

## 2021-10-27 DIAGNOSIS — Z8249 Family history of ischemic heart disease and other diseases of the circulatory system: Secondary | ICD-10-CM

## 2021-10-27 DIAGNOSIS — R0902 Hypoxemia: Secondary | ICD-10-CM

## 2021-10-27 DIAGNOSIS — N1832 Chronic kidney disease, stage 3b: Secondary | ICD-10-CM | POA: Diagnosis present

## 2021-10-27 DIAGNOSIS — R7881 Bacteremia: Secondary | ICD-10-CM | POA: Diagnosis present

## 2021-10-27 LAB — COMPREHENSIVE METABOLIC PANEL
ALT: 14 U/L (ref 0–44)
AST: 18 U/L (ref 15–41)
Albumin: 2.3 g/dL — ABNORMAL LOW (ref 3.5–5.0)
Alkaline Phosphatase: 197 U/L — ABNORMAL HIGH (ref 38–126)
Anion gap: 13 (ref 5–15)
BUN: 22 mg/dL (ref 8–23)
CO2: 21 mmol/L — ABNORMAL LOW (ref 22–32)
Calcium: 8.1 mg/dL — ABNORMAL LOW (ref 8.9–10.3)
Chloride: 100 mmol/L (ref 98–111)
Creatinine, Ser: 1.1 mg/dL — ABNORMAL HIGH (ref 0.44–1.00)
GFR, Estimated: 52 mL/min — ABNORMAL LOW (ref >60)
Glucose, Bld: 105 mg/dL — ABNORMAL HIGH (ref 70–99)
Potassium: 3.2 mmol/L — ABNORMAL LOW (ref 3.5–5.1)
Sodium: 134 mmol/L — ABNORMAL LOW (ref 135–145)
Total Bilirubin: 1.5 mg/dL — ABNORMAL HIGH (ref 0.3–1.2)
Total Protein: 4.7 g/dL — ABNORMAL LOW (ref 6.5–8.1)

## 2021-10-27 LAB — RESP PANEL BY RT-PCR (FLU A&B, COVID) ARPGX2
Influenza A by PCR: NEGATIVE
Influenza B by PCR: NEGATIVE
SARS Coronavirus 2 by RT PCR: NEGATIVE

## 2021-10-27 LAB — GLUCOSE, CAPILLARY
Glucose-Capillary: 108 mg/dL — ABNORMAL HIGH (ref 70–99)
Glucose-Capillary: 117 mg/dL — ABNORMAL HIGH (ref 70–99)

## 2021-10-27 LAB — CBC WITH DIFFERENTIAL/PLATELET
Abs Immature Granulocytes: 0.26 10*3/uL — ABNORMAL HIGH (ref 0.00–0.07)
Basophils Absolute: 0 10*3/uL (ref 0.0–0.1)
Basophils Relative: 0 %
Eosinophils Absolute: 0 10*3/uL (ref 0.0–0.5)
Eosinophils Relative: 0 %
HCT: 30.7 % — ABNORMAL LOW (ref 36.0–46.0)
Hemoglobin: 10.1 g/dL — ABNORMAL LOW (ref 12.0–15.0)
Immature Granulocytes: 2 %
Lymphocytes Relative: 9 %
Lymphs Abs: 1 10*3/uL (ref 0.7–4.0)
MCH: 32.5 pg (ref 26.0–34.0)
MCHC: 32.9 g/dL (ref 30.0–36.0)
MCV: 98.7 fL (ref 80.0–100.0)
Monocytes Absolute: 0.5 10*3/uL (ref 0.1–1.0)
Monocytes Relative: 5 %
Neutro Abs: 9.2 10*3/uL — ABNORMAL HIGH (ref 1.7–7.7)
Neutrophils Relative %: 84 %
Platelets: 153 10*3/uL (ref 150–400)
RBC: 3.11 MIL/uL — ABNORMAL LOW (ref 3.87–5.11)
RDW: 16 % — ABNORMAL HIGH (ref 11.5–15.5)
WBC: 10.9 10*3/uL — ABNORMAL HIGH (ref 4.0–10.5)
nRBC: 0.3 % — ABNORMAL HIGH (ref 0.0–0.2)

## 2021-10-27 LAB — LACTIC ACID, PLASMA
Lactic Acid, Venous: 1.5 mmol/L (ref 0.5–1.9)
Lactic Acid, Venous: 1.6 mmol/L (ref 0.5–1.9)

## 2021-10-27 LAB — TROPONIN I (HIGH SENSITIVITY)
Troponin I (High Sensitivity): 23 ng/L — ABNORMAL HIGH (ref <18)
Troponin I (High Sensitivity): 20 ng/L — ABNORMAL HIGH (ref <18)

## 2021-10-27 LAB — BRAIN NATRIURETIC PEPTIDE: B Natriuretic Peptide: 495 pg/mL — ABNORMAL HIGH (ref 0.0–100.0)

## 2021-10-27 LAB — MAGNESIUM: Magnesium: 1.5 mg/dL — ABNORMAL LOW (ref 1.7–2.4)

## 2021-10-27 MED ORDER — MIDODRINE HCL 5 MG PO TABS
10.0000 mg | ORAL_TABLET | Freq: Three times a day (TID) | ORAL | Status: DC
  Administered 2021-10-27 – 2021-10-30 (×8): 10 mg via ORAL
  Filled 2021-10-27 (×9): qty 2

## 2021-10-27 MED ORDER — IPRATROPIUM-ALBUTEROL 0.5-2.5 (3) MG/3ML IN SOLN
3.0000 mL | RESPIRATORY_TRACT | Status: DC | PRN
  Administered 2021-10-29: 3 mL via RESPIRATORY_TRACT
  Filled 2021-10-27: qty 3

## 2021-10-27 MED ORDER — POTASSIUM CHLORIDE 10 MEQ/100ML IV SOLN: 10.0000 meq | INTRAVENOUS | Status: DC

## 2021-10-27 MED ORDER — INSULIN ASPART 100 UNIT/ML IJ SOLN
0.0000 [IU] | Freq: Every day | INTRAMUSCULAR | Status: DC
Start: 2021-10-27 — End: 2021-10-30

## 2021-10-27 MED ORDER — MAGNESIUM OXIDE -MG SUPPLEMENT 400 (240 MG) MG PO TABS
400.0000 mg | ORAL_TABLET | Freq: Two times a day (BID) | ORAL | Status: AC
  Administered 2021-10-27 – 2021-10-28 (×2): 400 mg via ORAL
  Filled 2021-10-27 (×2): qty 1

## 2021-10-27 MED ORDER — APIXABAN 5 MG PO TABS
5.0000 mg | ORAL_TABLET | Freq: Two times a day (BID) | ORAL | Status: DC
  Administered 2021-10-27 – 2021-10-30 (×6): 5 mg via ORAL
  Filled 2021-10-27 (×6): qty 1

## 2021-10-27 MED ORDER — ACETAMINOPHEN 325 MG PO TABS
650.0000 mg | ORAL_TABLET | Freq: Four times a day (QID) | ORAL | Status: DC | PRN
  Administered 2021-10-27 – 2021-10-30 (×2): 650 mg via ORAL
  Filled 2021-10-27 (×3): qty 2

## 2021-10-27 MED ORDER — EMPAGLIFLOZIN 10 MG PO TABS: 10.0000 mg | ORAL_TABLET | Freq: Every day | ORAL | Status: DC

## 2021-10-27 MED ORDER — POTASSIUM CHLORIDE 20 MEQ PO PACK: 40.0000 meq | PACK | Freq: Two times a day (BID) | ORAL | Status: DC

## 2021-10-27 MED ORDER — SILDENAFIL CITRATE 20 MG PO TABS
40.0000 mg | ORAL_TABLET | Freq: Three times a day (TID) | ORAL | Status: DC
  Administered 2021-10-27 – 2021-10-28 (×2): 40 mg via ORAL
  Filled 2021-10-27 (×2): qty 2

## 2021-10-27 MED ORDER — NYSTATIN 100000 UNIT/GM EX POWD
Freq: Three times a day (TID) | CUTANEOUS | Status: DC
  Filled 2021-10-27 (×3): qty 15

## 2021-10-27 MED ORDER — INSULIN ASPART 100 UNIT/ML IJ SOLN
0.0000 [IU] | Freq: Three times a day (TID) | INTRAMUSCULAR | Status: DC
  Administered 2021-10-29 – 2021-10-30 (×3): 1 [IU] via SUBCUTANEOUS

## 2021-10-27 MED ORDER — FUROSEMIDE 20 MG PO TABS
20.0000 mg | ORAL_TABLET | Freq: Every day | ORAL | Status: DC
  Administered 2021-10-27 – 2021-10-28 (×2): 20 mg via ORAL
  Filled 2021-10-27 (×2): qty 1

## 2021-10-27 MED ORDER — LEVOTHYROXINE SODIUM 50 MCG PO TABS
50.0000 ug | ORAL_TABLET | Freq: Every day | ORAL | Status: DC
  Administered 2021-10-28 – 2021-10-29 (×2): 50 ug via ORAL
  Filled 2021-10-27 (×3): qty 1

## 2021-10-27 MED ORDER — SENNOSIDES-DOCUSATE SODIUM 8.6-50 MG PO TABS
1.0000 | ORAL_TABLET | Freq: Two times a day (BID) | ORAL | Status: AC
  Administered 2021-10-27 – 2021-10-28 (×2): 1 via ORAL
  Filled 2021-10-27 (×2): qty 1

## 2021-10-27 MED ORDER — PROMETHAZINE HCL 12.5 MG PO TABS
12.5000 mg | ORAL_TABLET | Freq: Four times a day (QID) | ORAL | Status: DC | PRN
  Administered 2021-10-29: 12.5 mg via ORAL
  Filled 2021-10-27 (×2): qty 1

## 2021-10-27 MED ORDER — MAGNESIUM OXIDE -MG SUPPLEMENT 400 (240 MG) MG PO TABS: 400.0000 mg | ORAL_TABLET | Freq: Two times a day (BID) | ORAL | Status: DC

## 2021-10-27 MED ORDER — DILTIAZEM HCL ER COATED BEADS 120 MG PO CP24
120.0000 mg | ORAL_CAPSULE | Freq: Every day | ORAL | Status: DC
  Administered 2021-10-27 – 2021-10-28 (×2): 120 mg via ORAL
  Filled 2021-10-27 (×7): qty 1

## 2021-10-27 MED ORDER — SENNOSIDES-DOCUSATE SODIUM 8.6-50 MG PO TABS: 1.0000 | ORAL_TABLET | Freq: Two times a day (BID) | ORAL | Status: DC

## 2021-10-27 MED ORDER — POLYETHYLENE GLYCOL 3350 17 G PO PACK
17.0000 g | PACK | Freq: Two times a day (BID) | ORAL | Status: DC
  Administered 2021-10-27 – 2021-10-29 (×5): 17 g via ORAL
  Filled 2021-10-27 (×5): qty 1

## 2021-10-27 MED ORDER — ACETAMINOPHEN 650 MG RE SUPP: 650.0000 mg | Freq: Four times a day (QID) | RECTAL | Status: DC | PRN

## 2021-10-27 MED ORDER — MAGNESIUM SULFATE 2 GM/50ML IV SOLN: 2.0000 g | Freq: Once | INTRAVENOUS | Status: DC

## 2021-10-27 MED ORDER — POTASSIUM CHLORIDE 20 MEQ PO PACK
40.0000 meq | PACK | ORAL | Status: AC
  Administered 2021-10-27 (×2): 40 meq via ORAL
  Filled 2021-10-27 (×3): qty 2

## 2021-10-27 MED ORDER — DOXYCYCLINE HYCLATE 100 MG PO TABS
100.0000 mg | ORAL_TABLET | Freq: Two times a day (BID) | ORAL | Status: DC
  Administered 2021-10-27 – 2021-10-30 (×6): 100 mg via ORAL
  Filled 2021-10-27 (×6): qty 1

## 2021-10-27 NOTE — Progress Notes (Signed)
Notified MD of patient's BP, 100/45, and asked about giving sildenafil. Provider responded that it would be fine to give and will add parameters.

## 2021-10-27 NOTE — ED Triage Notes (Signed)
Pt arrived by EMS from home complaining of increased shortness of breath starting yesterday. EMS reports RA 78% on arrival. They placed  her on 4L [place]  Pt is alert and oriented.  Has foley chronic foley due to immobility for almost a year  Hx of COPD, CHF and DM

## 2021-10-27 NOTE — H&P (Addendum)
History and Physical    DOT SPLINTER OTR:[id number] DOB: [birth year] DOA: 10/14/2021  PCP: Claretta Fraise, MD   Patient coming from: Home  I have personally briefly reviewed patient's old medical records in [place]  Chief Complaint: Difficulty Breathing  HPI: [name] is a 75 y.o. female with medical history significant for  Atria fibrillation on chronic anticoagulation, COPD, DM, CHF, pulmonary hypertension, coronary artery disease, bedbound.  Presented to the ED with complaints of difficulty breathing that gradually started after she was discharged from [facility] just 2 days ago.  No cough.  No chest pain.  She reports her legs have been swelling again since she has been discharged home.  She reports compliance with Lasix and Eliquis (also reports taking torsemide).  She is not quite sure of what and if she was given blood thinners while she was hospitalized.  She does not know what her baseline weight is as she has not been able to stand in over a year. She reports 2 episodes of vomiting last night and this morning, no diarrhea, no abdominal pain.  She reports her last bowel movement was 3 to 4 days ago.  Patient reports she used to be on chronic O2 2 L over the up until a month ago, when she was taken off O2.  Otherwise she has been on home O2 since 2012.  Recent hospitalization 11/21-11/25, discharged 2 days ago.  Managed for acute congestive heart failure with acute on chronic respiratory failure with hypoxia and hypercapnia, she required BiPAP, and COPD exacerbation.  Chest x-ray showing bilateral edema and effusions.  There was also concern for UTI/pyuria, atrial fibrillation with RVR with heart rate in the 110s.  She was significantly volume overloaded.  Blood cultures X 2 returned positive for coagulase-negative - staph penttenkoferi, thought likely to be a skin contaminant versus patient's chronic indwelling Foley thought to be source, but she was treated with  doxycycline with plan to complete 14 days. On discharge, patient was saturating well on room air, and a prescription for as needed Lasix 20 mg.  Hospitalized  [facility] 11/12 - 11/18- for COPD.  ED Course: O2 sats down to 78 -80% on room air, temperature 97.7.  Heart rate 70s to 80s.  Respiratory 15-26.  Blood pressure systolic 833-383.  Chest x-ray shows small effusions and atelectasis, no pneumonia or edema.  WBC 10.8.  BNP 495.  Normal lactic acid 1.5.  Troponin 23 > 20.  Hospitalist to admit for acute respiratory failure.  Review of Systems: As per HPI all other systems reviewed and negative.  Past Medical History:  Diagnosis Date   Acute on chronic diastolic (congestive) heart failure ([place]) 10/12/2019   Anemia    Arthritis    Asthma    Atrial fibrillation (HCC)    CAD (coronary artery disease)    LAD 60% proximal stenosis mid and distal 60% stenosis 2009.   Cataract    had surgery   CHF (congestive heart failure) (HCC)    COPD (chronic obstructive pulmonary disease) (HCC)    Diabetes mellitus    Gait instability    uses cane   GERD (gastroesophageal reflux disease)    Hyperlipidemia    Hypertension    Obesity    Oxygen dependent    2 liter per nasal cannula   Sleep apnea, obstructive 2008   Stroke [facility])    2 mini    Past Surgical History:  Procedure Laterality Date   APPENDECTOMY  CARPAL TUNNEL RELEASE     right hand   CATARACT EXTRACTION W/ INTRAOCULAR LENS  IMPLANT, BILATERAL     CERVICAL SPINE SURGERY     fusion   CESAREAN SECTION     CHOLECYSTECTOMY     COLONOSCOPY  11/22/2002   normal - Dr.Rrourk   COLONOSCOPY  11/26/2005   incomplete with negative BE (Dr. Rowe Pavy, [facility])   [place] IM NAIL Left 05/08/2013   Procedure: INTRAMEDULLARY (IM) NAIL FEMORAL--LONG(BIOMET SYSTEM) and Zimmer Cables;  Surgeon: Augustin Schooling, MD;  Location: [facility];  Service: Orthopedics;  Laterality: Left;   HIP SURGERY Left    KNEE ARTHROSCOPY     twice, left    RIGHT HEART CATH N/A 10/13/2019   Procedure: Luiz Blare Placement;  Surgeon: Jolaine Artist, MD;  Location: [place] [facility];  Service: Cardiovascular;  Laterality: N/A;   UMBILICAL HERNIA REPAIR     UPPER GASTROINTESTINAL ENDOSCOPY  11/22/2002   Normal - Dr. Gala Romney     reports that she has never smoked. She has never used smokeless tobacco. She reports that she does not drink alcohol and does not use drugs.  Allergies  Allergen Reactions   Lisinopril     R/t to kidney   Codeine Nausea And Vomiting    Family History  Problem Relation Age of Onset   Colon cancer Mother    Heart attack Mother    Alzheimer's disease Mother        father   Diabetes Mother    Heart disease Mother    Cancer Mother        colon   Kidney disease Mother    Cancer Sister        brain and spine   Migraines Sister    Alzheimer's disease Father    Cancer Brother        stomach   Diabetes Sister    Cancer Sister        breast   Heart disease Sister    Heart attack Sister    Breast cancer Sister    Heart disease Brother    Heart murmur Brother    Diabetes Other    Heart attack Other    Diabetes Daughter    Asthma Daughter    Asthma Grandchild     Prior to Admission medications   Medication Sig Start Date End Date Taking? Authorizing Provider  acetaminophen (TYLENOL) 500 MG tablet Take 500 mg by mouth every 4 (four) hours as needed for mild pain.    Yes [provider]  apixaban (ELIQUIS) 5 MG TABS tablet TAKE ONE TABLET BY MOUTH TWICE DAILY 06/11/21  Yes Bensimhon, Shaune Pascal, MD  diltiazem (TIAZAC) 120 MG 24 hr capsule Take 120 mg by mouth daily. 06/21/21 06/21/22 Yes [provider]  doxycycline (VIBRAMYCIN) 100 MG capsule Take 100 mg by mouth 2 (two) times daily. 10/25/21  Yes [provider]  furosemide (LASIX) 20 MG tablet Take 20 mg by mouth daily. 10/25/21  Yes [provider]  JARDIANCE 10 MG TABS tablet Take 10 mg by mouth daily. 10/18/21  Yes  [provider]  levothyroxine (SYNTHROID) 50 MCG tablet Take 1 tablet by mouth daily. 01/08/21  Yes [provider]  midodrine (PROAMATINE) 10 MG tablet TAKE ONE TABLET BY MOUTH THREE TIMES DAILY. NEEDS APPOINTMENT FOR FURTHER REFILLS. 08/01/21  Yes Bensimhon, Shaune Pascal, MD  nystatin cream (MYCOSTATIN) Apply 1 application topically 2 (two) times daily. Patient taking  differently: Apply 1 application topically 2 (two) times daily as needed. 06/07/21  Yes Hassell Done, Mary-Margaret, FNP  ondansetron (ZOFRAN-ODT) 8 MG disintegrating tablet Take 1 tablet (8 mg total) by mouth every 6 (six) hours as needed for nausea or vomiting. 05/28/20  Yes Stacks, Cletus Gash, MD  polyethylene glycol (MIRALAX / GLYCOLAX) 17 g packet Take 17 g by mouth 2 (two) times daily. Patient taking differently: Take 17 g by mouth 2 (two) times daily as needed. 11/26/20  Yes Claretta Fraise, MD  sildenafil (REVATIO) 20 MG tablet Take 2 tablets (40 mg total) by mouth 3 (three) times daily. 11/08/19  Yes Clegg, Amy D, NP  traZODone (DESYREL) 50 MG tablet Take 50 mg by mouth at bedtime as needed. 05/23/21  Yes [provider]  Accu-Chek Softclix Lancets lancets Check BS BID Dx E11.9 09/12/21   Claretta Fraise, MD  albuterol (PROVENTIL) (2.5 MG/3ML) 0.083% nebulizer solution INHALE THE CONTENTS OF 1 VIAL VIA NEBULIZER EVERY 6 HOURS AS NEEDED FOR WHEEZING OR SHORTNESS OF BREATH Patient not taking: Reported on 10/21/2021 11/13/20   Claretta Fraise, MD  albuterol (VENTOLIN HFA) 108 (90 Base) MCG/ACT inhaler INHALE 1 PUFF BY MOUTH EVERY 6 HOURS AS NEEDED FOR SHORTNESS OF BREATH Patient not taking: Reported on 10/11/2021 01/08/21   Claretta Fraise, MD  Blood Glucose Monitoring Suppl (ACCU-CHEK GUIDE) w/Device KIT Check BS 2 times daily Dx E11.9 09/18/21   Claretta Fraise, MD  Catheters (CATHETER RED RUBBER COATED) Pine Pure Elza Rafter brand is preferred. Use every 4 hours as needed for urinary incontinence. 06/29/20   Claretta Fraise, MD   diclofenac Sodium (VOLTAREN) 1 % GEL  06/21/21   [provider]  gabapentin (NEURONTIN) 300 MG capsule Take 1 capsule (300 mg total) by mouth at bedtime. (NEEDS TO BE SEEN BEFORE NEXT REFILL) Patient not taking: Reported on 10/15/2021 07/19/21   Claretta Fraise, MD  GLOBAL EASE INJECT PEN NEEDLES 32G X 4 MM MISC  09/04/21   [provider]  glucose blood (ACCU-CHEK GUIDE) test strip Check BS BID Dx E11.9 09/12/21   Claretta Fraise, MD  ipratropium-albuterol (DUONEB) 0.5-2.5 (3) MG/3ML SOLN INHALE THE CONTENTS OF 1 VIAL VIA NEBULIZER EVERY 6 HOURS AS NEEDED FOR SHORTNESS OF BREATH Patient not taking: Reported on 10/26/2021 03/08/21   Claretta Fraise, MD  levothyroxine (SYNTHROID) 50 MCG tablet TAKE ONE TABLET BY MOUTH DAILY Patient not taking: Reported on 10/26/2021 01/08/21   Claretta Fraise, MD  potassium chloride SA (KLOR-CON) 20 MEQ tablet TAKE ONE TABLET BY MOUTH DAILY Patient not taking: Reported on 10/02/2021 10/24/20   Bensimhon, Shaune Pascal, MD  promethazine (PHENERGAN) 25 MG tablet  07/13/21   [provider]  rivastigmine (EXELON) 4.6 mg/24hr Place 1 patch (4.6 mg total) onto the skin daily. Patient not taking: Reported on 10/12/2021 07/19/21   Claretta Fraise, MD  spironolactone (ALDACTONE) 25 MG tablet Take 0.5 tablets (12.5 mg total) by mouth daily. Patient not taking: Reported on 10/09/2021 06/11/21   Bensimhon, Shaune Pascal, MD  sulfamethoxazole-trimethoprim (BACTRIM DS) 800-160 MG tablet Take 1 tablet by mouth 2 (two) times daily. Patient not taking: Reported on 10/15/2021 10/02/21   Claretta Fraise, MD  torsemide (DEMADEX) 20 MG tablet Take 1 tablet (20 mg total) by mouth daily. Patient not taking: Reported on 10/24/2021 07/19/21   Claretta Fraise, MD  vitamin B-12 (CYANOCOBALAMIN) 500 MCG tablet Take 500 mcg by mouth daily. Patient not taking: Reported on 10/04/2021    [provider]    Physical Exam: Vitals:  10/23/2021 1300 10/24/2021 1330 10/30/2021 1400  10/07/2021 1430  BP: (!) 120/54 (!) 114/54 (!) 119/55 (!) 112/54  Pulse: 74 73 72 70  Resp: (!) 26 (!) 24 16 15   Temp:      TempSrc:      SpO2: 99% 100% 100% 100%  Weight:      Height:        Constitutional:  calm, comfortable Vitals:   10/13/2021 1300 10/29/2021 1330 10/13/2021 1400 10/26/2021 1430  BP: (!) 120/54 (!) 114/54 (!) 119/55 (!) 112/54  Pulse: 74 73 72 70  Resp: (!) 26 (!) 24 16 15   Temp:      TempSrc:      SpO2: 99% 100% 100% 100%  Weight:      Height:       Eyes: PERRL, lids and conjunctivae normal ENMT: Mucous membranes are moist.   Neck: normal, supple, no masses, no thyromegaly Respiratory: clear to auscultation bilaterally, no wheezing, no crackles. Normal respiratory effort. No accessory muscle use.  Cardiovascular: irregular rate and rhythm, no murmurs / rubs / gallops.  No significant pitting extremity edema.  Lower extremities warm. Abdomen: no tenderness, no masses palpated. No hepatosplenomegaly. Bowel sounds positive.  Foley catheter in place. Musculoskeletal: no clubbing / cyanosis. No joint deformity upper and lower extremities. Good ROM, no contractures. Normal muscle tone.  Skin: Multiple discolorations to upper extremities ecchymosis, purpura from multiple fingersticks-patient sustained during recent hospitalizations.  Stasis skin changes to bilateral lower extremities.   Stage I and stage II sacral decubitus ulcers.  Stage II ulcers are superficial.  Also groin, vulva, and between upper thighs-   appear irritated and erythematous.   Candidal type satellite lesions to underneath bilateral breasts. Neurologic: No apparent cranial nerve abnormality, moving upper extremities spontaneously, unable to move bilateral lower extremities-chronic and her baseline. Psychiatric: Normal judgment and insight. Alert and oriented x 3. Normal mood.        Labs on Admission: I have personally reviewed following labs and imaging studies  CBC: Recent Labs  Lab  10/26/2021 1254  WBC 10.9*  NEUTROABS 9.2*  HGB 10.1*  HCT 30.7*  MCV 98.7  PLT 893   Basic Metabolic Panel: Recent Labs  Lab 10/22/2021 1254  NA 134*  K 3.2*  CL 100  CO2 21*  GLUCOSE 105*  BUN 22  CREATININE 1.10*  CALCIUM 8.1*   GFR: Estimated Creatinine Clearance: 56.7 mL/min (A) (by C-G formula based on SCr of 1.1 mg/dL (H)). Liver Function Tests: Recent Labs  Lab 10/23/2021 1254  AST 18  ALT 14  ALKPHOS 197*  BILITOT 1.5*  PROT 4.7*  ALBUMIN 2.3*   Urine analysis:    Component Value Date/Time   COLORURINE YELLOW 10/13/2019 0036   APPEARANCEUR Cloudy (A) 10/02/2021 1448   LABSPEC 1.014 10/13/2019 0036   PHURINE 5.0 10/13/2019 0036   GLUCOSEU Negative 10/02/2021 1448   HGBUR MODERATE (A) 10/13/2019 0036   BILIRUBINUR Negative 10/02/2021 1448   KETONESUR NEGATIVE 10/13/2019 0036   PROTEINUR Negative 10/02/2021 1448   PROTEINUR 30 (A) 10/13/2019 0036   UROBILINOGEN negative 12/23/2013 1057   UROBILINOGEN 0.2 05/06/2013 2213   NITRITE Negative 10/02/2021 1448   NITRITE NEGATIVE 10/13/2019 0036   LEUKOCYTESUR 3+ (A) 10/02/2021 1448   LEUKOCYTESUR LARGE (A) 10/13/2019 0036    Radiological Exams on Admission: DG Chest Portable 1 View  Result Date: 10/20/2021 CLINICAL DATA:  Increasing shortness of breath beginning yesterday. EXAM: PORTABLE CHEST 1 VIEW COMPARISON:  10/25/2021 and older studies. FINDINGS:  Cardiac silhouette is mildly enlarged. No mediastinal or hilar masses. Chronic thickening of the interstitial markings as well as chronic perihilar and lung base bronchitic changes. Opacity extends along the minor fissure and there is opacity at both lung bases consistent with a combination of small effusions and atelectasis. These findings are all similar to the prior study allowing for differences in patient positioning and technique. No pneumothorax. IMPRESSION: 1. No significant change from the recent prior study. Lung base opacities are noted consistent with a  combination of small effusions and atelectasis. No convincing pneumonia or pulmonary edema. Electronically Signed   By: Lajean Manes M.D.   On: 10/08/2021 13:39    EKG: Independently reviewed.  Atrial fibrillation low voltage, rate 76.  QTc prolonged 557.  No significant change from prior.  Assessment/Plan Principal Problem:   Acute respiratory failure with hypoxia (HCC) Active Problems:   Severe obesity (BMI >= 40) (HCC)   Atrial fibrillation, chronic (HCC)   Hypothyroidism   Current use of long term anticoagulation   Lymphedema of both lower extremities   Moderate to severe pulmonary hypertension (HCC)   Chronic indwelling Foley catheter   Sacral decubitus ulcer  Acute on chronic respiratory failure with hypoxia, moderate to severe pulmonary hypertension- O2 sats 79 to 80% on room air.  Chest x-ray with small pleural effusions and atelectasis.  No pneumonia or edema.  On Eliquis, so doubt pulmonary embolism.  Used to be on chronic O2 2 L, this was discontinued about a month ago.  Otherwise has been on chronic O2 since 2012.  At this time etiology likely 2/2 pulmonary hypertension. Last Echo 2020-moderate pulmonary artery hypertension with RV systolic pressure of 66. -Would likely need home O2 on discharge -Resume sildenafil  Vomiting- x 2, last bowel movement 3 to 4 days ago.  No abdominal pain.  No leukocytosis. - Abdominal xray-shows increased stool in rectum - Phenergan prn  Prolonged QTC- 557. Mild hypokalemia and hypomagnesemia. EKGs from 2021- showing Qtc prolonged up to 600s.  Electrolyte abnormalities- hypokalemia- 3.2, hypomagnesemia- 1.5.  - Unfortunately just told that patient has had no IV access, and nursing staff had been unsuccessful with [place] in ED. Will switch to PO for now. Options include PICC placement, but held off for now as I anticipate a very short hospitalization.  Chronic indwelling Foley catheter-placed a month ago because patient is bedbound.  Urine bag  labelled- inserted 11/18.  Sacral decubitus ulcers, candidal intertrigo-bedbound. Stage 1 and 2 ulcers. Candidal yeast infection to underneath breast and groin area. - Nystatin topical  Chronic diastolic congestive heart failure-recent hospitalization for volume overload,  was diuresed.  Was discharged home on Lasix 20 mg as needed.  Weight on discharge 2 days ago at [facility] is the same as her weight today- 167 pounds.  No appreciable peripheral signs of volume overload.  Chest x-ray shows small effusions/atelectasis.  BNP 495, not appreciably changed from prior. -Last echo 2020 EF 65 to 70%, right ventricular systolic pressure 12.8.  No recent echo on file. -Was discharged home on as needed Lasix 20 mg, will resume as 20 mg daily  Atrial fibrillation on chronic anticoagulation -rate controlled and on anticoagulation. -Resume diltiazem and Eliquis.  Diabetes mellitus-random glucose 105. Home meds- Jardiance. - SSI- s - Hgba1c  Recent ? Bacteremia diagnosis- at [facility], blood cultures X 2 returned positive for coagulase-negative - staph penttenkoferi, thought likely to be a skin contaminant versus patient's chronic indwelling Foley thought to be source, but she was treated  with doxycycline with plan to complete 14 days. -Continue doxycycline.  Chronic anemia - Stable. hemoglobin 10.1.  Baseline 8-9.  OSA, COPD- Not on CPAP. No wheezing, or rhonchi. - Duonebs PRN   DVT prophylaxis: Eliquis Code Status: DNR-  Universal DNR form at bedside.  I confirmed with patient that the DNR form still reflects her wishes. Family Communication: None at bedside. Daughter [name] is HCPOA. Disposition Plan: ~ 1-2 days.  Will likely need home O2.  Likely discharge to home. Consults called: None Admission status:  Obs tele   Bethena Roys MD [facility]  10/11/2021, 6:39 PM

## 2021-10-27 NOTE — ED Provider Notes (Signed)
[facility] EMERGENCY DEPARTMENT Provider Note   CSN: [id number] Arrival date & time: 10/05/2021  1214     History Chief Complaint  Patient presents with   Shortness of Breath    [name] is a 75 y.o. female.   Shortness of Breath Associated symptoms: cough   Associated symptoms: no abdominal pain and no fever   Patient presents with shortness of breath.  Comes reportedly from home.  Shortness of breath yesterday.  Room air sats of 78% for EMS.  On 4 L.  Had previously been on oxygen but not currently on it at home.  Recent admissions to [facility] for sepsis/pneumonia/UTI/COPD/CHF.  Reportedly had positive blood cultures.  Discharged 2 days ago.  Also had another admission earlier this month.  Patient states she feels if she is carrying more fluid.  Has a cough with some mild production.  Appears to have CHF and severe pulmonary hypertension.  Permanent A. fib.  She is on anticoagulation.    Past Medical History:  Diagnosis Date   Acute on chronic diastolic (congestive) heart failure ([place]) 10/12/2019   Anemia    Arthritis    Asthma    Atrial fibrillation (HCC)    CAD (coronary artery disease)    LAD 60% proximal stenosis mid and distal 60% stenosis 2009.   Cataract    had surgery   CHF (congestive heart failure) (HCC)    COPD (chronic obstructive pulmonary disease) (HCC)    Diabetes mellitus    Gait instability    uses cane   GERD (gastroesophageal reflux disease)    Hyperlipidemia    Hypertension    Obesity    Oxygen dependent    2 liter per nasal cannula   Sleep apnea, obstructive 2008   Stroke [facility])    2 mini    Patient Active Problem List   Diagnosis Date Noted   Sacral ulcer, limited to breakdown of skin ([place]) 06/20/2021   Gram-positive bacteremia 06/18/2021   Diabetes mellitus type 2, insulin dependent ([place]) 06/16/2021   Hyponatremia 06/16/2021   UTI (urinary tract infection) 05/19/2020   Cor pulmonale (chronic) (HCC) 04/25/2020   Moderate  to severe pulmonary hypertension ([place]) 04/25/2020   Lymphedema of both lower extremities 04/17/2020   Physical debility 04/14/2020   TIA (transient ischemic attack)    Acute renal failure superimposed on stage 3 chronic kidney disease (HCC)    Facial droop 06/22/2019   Educated about COVID-19 virus infection 04/12/2019   Leg swelling 04/12/2019   Coronary artery disease involving native coronary artery of native heart without angina pectoris 04/12/2019   Hyperlipidemia 02/23/2018   Current use of long term anticoagulation 08/26/2017   Hypothyroidism 02/04/2017   Long-term current use of insulin for diabetes mellitus ([place]) 08/15/2015   Chronic renal insufficiency 08/15/2015   Diabetic nephropathy associated with type 2 diabetes mellitus ([place]) 08/15/2015   Osteoporosis 02/21/2015   Normocytic anemia due to blood loss 05/09/2013   Thrombocytopenia, unspecified ([place]) 05/09/2013   Severe obesity (BMI >= 40) ([place]) 02/28/2008   PULMONARY HYPERTENSION 02/28/2008   Congestive heart failure ([place]) 02/28/2008   PULMONARY NODULE, LEFT UPPER LOBE 02/28/2008   OBSTRUCTIVE SLEEP APNEA 02/28/2008    Past Surgical History:  Procedure Laterality Date   APPENDECTOMY     CARPAL TUNNEL RELEASE     right hand   CATARACT EXTRACTION W/ INTRAOCULAR LENS  IMPLANT, BILATERAL     CERVICAL SPINE SURGERY     fusion   CESAREAN SECTION  CHOLECYSTECTOMY     COLONOSCOPY  11/22/2002   normal - Dr.Rrourk   COLONOSCOPY  11/26/2005   incomplete with negative BE (Dr. Rowe Pavy, [facility])   [place] IM NAIL Left 05/08/2013   Procedure: INTRAMEDULLARY (IM) NAIL FEMORAL--LONG(BIOMET SYSTEM) and Zimmer Cables;  Surgeon: Augustin Schooling, MD;  Location: [place];  Service: Orthopedics;  Laterality: Left;   HIP SURGERY Left    KNEE ARTHROSCOPY     twice, left   RIGHT HEART CATH N/A 10/13/2019   Procedure: Luiz Blare Placement;  Surgeon: Jolaine Artist, MD;  Location: [place] [facility];  Service: Cardiovascular;   Laterality: N/A;   UMBILICAL HERNIA REPAIR     UPPER GASTROINTESTINAL ENDOSCOPY  11/22/2002   Normal - Dr. Gala Romney     OB History   No obstetric history on file.     Family History  Problem Relation Age of Onset   Colon cancer Mother    Heart attack Mother    Alzheimer's disease Mother        father   Diabetes Mother    Heart disease Mother    Cancer Mother        colon   Kidney disease Mother    Cancer Sister        brain and spine   Migraines Sister    Alzheimer's disease Father    Cancer Brother        stomach   Diabetes Sister    Cancer Sister        breast   Heart disease Sister    Heart attack Sister    Breast cancer Sister    Heart disease Brother    Heart murmur Brother    Diabetes Other    Heart attack Other    Diabetes Daughter    Asthma Daughter    Asthma Grandchild     Social History   Tobacco Use   Smoking status: Never   Smokeless tobacco: Never  Vaping Use   Vaping Use: Never used  Substance Use Topics   Alcohol use: No   Drug use: No    Home Medications Prior to Admission medications   Medication Sig Start Date End Date Taking? Authorizing Provider  acetaminophen (TYLENOL) 500 MG tablet Take 500 mg by mouth every 4 (four) hours as needed for mild pain.    Yes [provider]  apixaban (ELIQUIS) 5 MG TABS tablet TAKE ONE TABLET BY MOUTH TWICE DAILY 06/11/21  Yes Bensimhon, Shaune Pascal, MD  diltiazem (TIAZAC) 120 MG 24 hr capsule Take 120 mg by mouth daily. 06/21/21 06/21/22 Yes [provider]  doxycycline (VIBRAMYCIN) 100 MG capsule Take 100 mg by mouth 2 (two) times daily. 10/25/21  Yes [provider]  furosemide (LASIX) 20 MG tablet Take 20 mg by mouth daily. 10/25/21  Yes [provider]  JARDIANCE 10 MG TABS tablet Take 10 mg by mouth daily. 10/18/21  Yes [provider]  levothyroxine (SYNTHROID) 50 MCG tablet Take 1 tablet by mouth daily. 01/08/21  Yes [provider]  midodrine  (PROAMATINE) 10 MG tablet TAKE ONE TABLET BY MOUTH THREE TIMES DAILY. NEEDS APPOINTMENT FOR FURTHER REFILLS. 08/01/21  Yes Bensimhon, Shaune Pascal, MD  nystatin cream (MYCOSTATIN) Apply 1 application topically 2 (two) times daily. Patient taking differently: Apply 1 application topically 2 (two) times daily as needed. 06/07/21  Yes Martin, Mary-Margaret, FNP  ondansetron (ZOFRAN-ODT) 8 MG disintegrating tablet Take 1 tablet (8 mg total) by  mouth every 6 (six) hours as needed for nausea or vomiting. 05/28/20  Yes Stacks, Cletus Gash, MD  polyethylene glycol (MIRALAX / GLYCOLAX) 17 g packet Take 17 g by mouth 2 (two) times daily. Patient taking differently: Take 17 g by mouth 2 (two) times daily as needed. 11/26/20  Yes Claretta Fraise, MD  sildenafil (REVATIO) 20 MG tablet Take 2 tablets (40 mg total) by mouth 3 (three) times daily. 11/08/19  Yes Clegg, Amy D, NP  traZODone (DESYREL) 50 MG tablet Take 50 mg by mouth at bedtime as needed. 05/23/21  Yes [provider]  Accu-Chek Softclix Lancets lancets Check BS BID Dx E11.9 09/12/21   Claretta Fraise, MD  albuterol (PROVENTIL) (2.5 MG/3ML) 0.083% nebulizer solution INHALE THE CONTENTS OF 1 VIAL VIA NEBULIZER EVERY 6 HOURS AS NEEDED FOR WHEEZING OR SHORTNESS OF BREATH Patient not taking: Reported on 10/28/2021 11/13/20   Claretta Fraise, MD  albuterol (VENTOLIN HFA) 108 (90 Base) MCG/ACT inhaler INHALE 1 PUFF BY MOUTH EVERY 6 HOURS AS NEEDED FOR SHORTNESS OF BREATH Patient not taking: Reported on 10/25/2021 01/08/21   Claretta Fraise, MD  Blood Glucose Monitoring Suppl (ACCU-CHEK GUIDE) w/Device KIT Check BS 2 times daily Dx E11.9 09/18/21   Claretta Fraise, MD  Catheters (CATHETER RED RUBBER COATED) [place] Pure Elza Rafter brand is preferred. Use every 4 hours as needed for urinary incontinence. 06/29/20   Claretta Fraise, MD  diclofenac Sodium (VOLTAREN) 1 % GEL  06/21/21   [provider]  gabapentin (NEURONTIN) 300 MG capsule Take 1 capsule (300 mg total) by mouth  at bedtime. (NEEDS TO BE SEEN BEFORE NEXT REFILL) Patient not taking: Reported on 10/15/2021 07/19/21   Claretta Fraise, MD  GLOBAL EASE INJECT PEN NEEDLES 32G X 4 MM MISC  09/04/21   [provider]  glucose blood (ACCU-CHEK GUIDE) test strip Check BS BID Dx E11.9 09/12/21   Claretta Fraise, MD  ipratropium-albuterol (DUONEB) 0.5-2.5 (3) MG/3ML SOLN INHALE THE CONTENTS OF 1 VIAL VIA NEBULIZER EVERY 6 HOURS AS NEEDED FOR SHORTNESS OF BREATH Patient not taking: Reported on 10/18/2021 03/08/21   Claretta Fraise, MD  levothyroxine (SYNTHROID) 50 MCG tablet TAKE ONE TABLET BY MOUTH DAILY Patient not taking: Reported on 10/18/2021 01/08/21   Claretta Fraise, MD  potassium chloride SA (KLOR-CON) 20 MEQ tablet TAKE ONE TABLET BY MOUTH DAILY Patient not taking: Reported on 10/10/2021 10/24/20   Bensimhon, Shaune Pascal, MD  promethazine (PHENERGAN) 25 MG tablet  07/13/21   [provider]  rivastigmine (EXELON) 4.6 mg/24hr Place 1 patch (4.6 mg total) onto the skin daily. Patient not taking: Reported on 10/14/2021 07/19/21   Claretta Fraise, MD  spironolactone (ALDACTONE) 25 MG tablet Take 0.5 tablets (12.5 mg total) by mouth daily. Patient not taking: Reported on 10/06/2021 06/11/21   Bensimhon, Shaune Pascal, MD  sulfamethoxazole-trimethoprim (BACTRIM DS) 800-160 MG tablet Take 1 tablet by mouth 2 (two) times daily. Patient not taking: Reported on 10/03/2021 10/02/21   Claretta Fraise, MD  torsemide (DEMADEX) 20 MG tablet Take 1 tablet (20 mg total) by mouth daily. Patient not taking: Reported on 10/11/2021 07/19/21   Claretta Fraise, MD  vitamin B-12 (CYANOCOBALAMIN) 500 MCG tablet Take 500 mcg by mouth daily. Patient not taking: Reported on 10/15/2021    [provider]    Allergies    Lisinopril and Codeine  Review of Systems   Review of Systems  Constitutional:  Positive for fatigue. Negative for appetite change and fever.  HENT:  Negative for congestion.  Respiratory:  Positive for cough  and shortness of breath.   Cardiovascular:  Positive for leg swelling.  Gastrointestinal:  Negative for abdominal pain.  Genitourinary:  Negative for flank pain.  Musculoskeletal:  Negative for back pain.  Skin:  Positive for wound.  Neurological:  Negative for weakness.  Psychiatric/Behavioral:  Negative for confusion.    Physical Exam Updated Vital Signs BP (!) 112/54   Pulse 70   Temp 97.7 F (36.5 C) (Oral)   Resp 15   Ht _0  (1.651 m)   Wt 124 kg   SpO2 100%   BMI 45.49 kg/m   Physical Exam Vitals and nursing note reviewed.  HENT:     Head: Normocephalic.  Cardiovascular:     Rate and Rhythm: Normal rate.  Pulmonary:     Comments: Harsh breath sounds, particularly on left side.  Does have some rales.   Chest:     Chest wall: No tenderness.  Abdominal:     Tenderness: There is no abdominal tenderness.  Musculoskeletal:     Comments: Severe pitting edema bilateral upper and lower extremities.  Skin:    General: Skin is warm.     Comments: Pitting edema/anasarca.  Neurological:     Mental Status: She is alert and oriented to person, place, and time.    ED Results / Procedures / Treatments   Labs (all labs ordered are listed, but only abnormal results are displayed) Labs Reviewed  BRAIN NATRIURETIC PEPTIDE - Abnormal; Notable for the following components:      Result Value   B Natriuretic Peptide 495.0 (*)    All other components within normal limits  COMPREHENSIVE METABOLIC PANEL - Abnormal; Notable for the following components:   Sodium 134 (*)    Potassium 3.2 (*)    CO2 21 (*)    Glucose, Bld 105 (*)    Creatinine, Ser 1.10 (*)    Calcium 8.1 (*)    Total Protein 4.7 (*)    Albumin 2.3 (*)    Alkaline Phosphatase 197 (*)    Total Bilirubin 1.5 (*)    GFR, Estimated 52 (*)    All other components within normal limits  CBC WITH DIFFERENTIAL/PLATELET - Abnormal; Notable for the following components:   WBC 10.9 (*)    RBC 3.11 (*)    Hemoglobin  10.1 (*)    HCT 30.7 (*)    RDW 16.0 (*)    nRBC 0.3 (*)    Neutro Abs 9.2 (*)    Abs Immature Granulocytes 0.26 (*)    All other components within normal limits  TROPONIN I (HIGH SENSITIVITY) - Abnormal; Notable for the following components:   Troponin I (High Sensitivity) 23 (*)    All other components within normal limits  TROPONIN I (HIGH SENSITIVITY) - Abnormal; Notable for the following components:   Troponin I (High Sensitivity) 20 (*)    All other components within normal limits  CULTURE, BLOOD (ROUTINE X 2)  CULTURE, BLOOD (ROUTINE X 2)  RESP PANEL BY RT-PCR (FLU A&B, COVID) [id number]  LACTIC ACID, PLASMA  LACTIC ACID, PLASMA    EKG EKG Interpretation  Date/Time:  Sunday October 27 2021 12:52:33 EST Ventricular Rate:  76 PR Interval:    QRS Duration: 74 QT Interval:  495 QTC Calculation: 557 R Axis:   63 Text Interpretation: Atrial fibrillation Low voltage, extremity leads Prolonged QT interval Confirmed by Davonna Belling [phone]) on 10/12/2021 2:29:40 PM  Radiology DG Chest Portable 1 View  Result  Date: 10/08/2021 CLINICAL DATA:  Increasing shortness of breath beginning yesterday. EXAM: PORTABLE CHEST 1 VIEW COMPARISON:  10/25/2021 and older studies. FINDINGS: Cardiac silhouette is mildly enlarged. No mediastinal or hilar masses. Chronic thickening of the interstitial markings as well as chronic perihilar and lung base bronchitic changes. Opacity extends along the minor fissure and there is opacity at both lung bases consistent with a combination of small effusions and atelectasis. These findings are all similar to the prior study allowing for differences in patient positioning and technique. No pneumothorax. IMPRESSION: 1. No significant change from the recent prior study. Lung base opacities are noted consistent with a combination of small effusions and atelectasis. No convincing pneumonia or pulmonary edema. Electronically Signed   By: Lajean Manes M.D.   On:  10/16/2021 13:39    Procedures Procedures   Medications Ordered in ED Medications - No data to display  ED Course  I have reviewed the triage vital signs and the nursing notes.  Pertinent labs & imaging results that were available during my care of the patient were reviewed by me and considered in my medical decision making (see chart for details).    MDM Rules/Calculators/A&P                           Patient shortness of breath.  Got out of [facility] for similar symptoms 2 days ago.  Caregiver thinks he may have sent her home too early.  Has worsening swelling of legs and arms.  Chest x-ray abnormal but overall stable.  However has room air sats down to about 78%.  Does well on nasal cannula.  Not on oxygen at home.  Harsh breath sounds.  Likely combination of CHF and COPD also known pulmonary hypertension.  Will require admission to the hospital.  Will discuss with hospitalist for Final Clinical Impression(s) / ED Diagnoses Final diagnoses:  Hypoxia  Acute on chronic congestive heart failure, unspecified heart failure type ([place])  COPD exacerbation ([place])    Rx / DC Orders ED Discharge Orders     None        Davonna Belling, MD 10/26/2021 1503

## 2021-10-28 ENCOUNTER — Observation Stay (HOSPITAL_COMMUNITY): Payer: Medicare HMO

## 2021-10-28 DIAGNOSIS — Z978 Presence of other specified devices: Secondary | ICD-10-CM | POA: Diagnosis not present

## 2021-10-28 DIAGNOSIS — I5032 Chronic diastolic (congestive) heart failure: Secondary | ICD-10-CM | POA: Diagnosis not present

## 2021-10-28 DIAGNOSIS — I2781 Cor pulmonale (chronic): Secondary | ICD-10-CM | POA: Diagnosis not present

## 2021-10-28 DIAGNOSIS — I482 Chronic atrial fibrillation, unspecified: Secondary | ICD-10-CM | POA: Diagnosis not present

## 2021-10-28 DIAGNOSIS — I509 Heart failure, unspecified: Secondary | ICD-10-CM | POA: Diagnosis not present

## 2021-10-28 DIAGNOSIS — J9601 Acute respiratory failure with hypoxia: Secondary | ICD-10-CM | POA: Diagnosis not present

## 2021-10-28 LAB — ECHOCARDIOGRAM COMPLETE
Area-P 1/2: 3.53 cm2
Height: 66 in
S' Lateral: 3.1 cm
Weight: 2684.32 oz

## 2021-10-28 LAB — BASIC METABOLIC PANEL
Anion gap: 11 (ref 5–15)
BUN: 23 mg/dL (ref 8–23)
CO2: 22 mmol/L (ref 22–32)
Calcium: 8.1 mg/dL — ABNORMAL LOW (ref 8.9–10.3)
Chloride: 103 mmol/L (ref 98–111)
Creatinine, Ser: 1.06 mg/dL — ABNORMAL HIGH (ref 0.44–1.00)
GFR, Estimated: 54 mL/min — ABNORMAL LOW (ref >60)
Glucose, Bld: 86 mg/dL (ref 70–99)
Potassium: 4.1 mmol/L (ref 3.5–5.1)
Sodium: 136 mmol/L (ref 135–145)

## 2021-10-28 LAB — GLUCOSE, CAPILLARY
Glucose-Capillary: 83 mg/dL (ref 70–99)
Glucose-Capillary: 97 mg/dL (ref 70–99)
Glucose-Capillary: 111 mg/dL — ABNORMAL HIGH (ref 70–99)
Glucose-Capillary: 105 mg/dL — ABNORMAL HIGH (ref 70–99)

## 2021-10-28 LAB — CBC
HCT: 27.5 % — ABNORMAL LOW (ref 36.0–46.0)
Hemoglobin: 9 g/dL — ABNORMAL LOW (ref 12.0–15.0)
MCH: 32.6 pg (ref 26.0–34.0)
MCHC: 32.7 g/dL (ref 30.0–36.0)
MCV: 99.6 fL (ref 80.0–100.0)
Platelets: 146 10*3/uL — ABNORMAL LOW (ref 150–400)
RBC: 2.76 MIL/uL — ABNORMAL LOW (ref 3.87–5.11)
RDW: 16.2 % — ABNORMAL HIGH (ref 11.5–15.5)
WBC: 9.7 10*3/uL (ref 4.0–10.5)
nRBC: 0.3 % — ABNORMAL HIGH (ref 0.0–0.2)

## 2021-10-28 LAB — MAGNESIUM: Magnesium: 1.6 mg/dL — ABNORMAL LOW (ref 1.7–2.4)

## 2021-10-28 LAB — HEMOGLOBIN A1C
Hgb A1c MFr Bld: 6.4 % — ABNORMAL HIGH (ref 4.8–5.6)
Mean Plasma Glucose: 137 mg/dL

## 2021-10-28 MED ORDER — MAGNESIUM SULFATE 2 GM/50ML IV SOLN
2.0000 g | Freq: Once | INTRAVENOUS | Status: AC
  Administered 2021-10-28: 12:00:00 2 g via INTRAVENOUS
  Filled 2021-10-28: qty 50

## 2021-10-28 MED ORDER — BISACODYL 10 MG RE SUPP
10.0000 mg | Freq: Once | RECTAL | Status: AC
Start: 2021-10-28 — End: 2021-10-28
  Administered 2021-10-28: 13:00:00 10 mg via RECTAL
  Filled 2021-10-28: qty 1

## 2021-10-28 MED ORDER — SODIUM CHLORIDE 0.9 % IV BOLUS
500.0000 mL | Freq: Once | INTRAVENOUS | Status: AC
  Administered 2021-10-28: 14:00:00 500 mL via INTRAVENOUS

## 2021-10-28 MED ORDER — FUROSEMIDE 10 MG/ML IJ SOLN
20.0000 mg | Freq: Two times a day (BID) | INTRAMUSCULAR | Status: DC
Start: 2021-10-28 — End: 2021-10-29
  Administered 2021-10-28 – 2021-10-29 (×2): 20 mg via INTRAVENOUS
  Filled 2021-10-28 (×3): qty 2

## 2021-10-28 MED ORDER — SODIUM CHLORIDE 0.9% FLUSH
10.0000 mL | Freq: Two times a day (BID) | INTRAVENOUS | Status: DC
  Administered 2021-10-28 – 2021-10-30 (×5): 10 mL

## 2021-10-28 MED ORDER — ALBUMIN HUMAN 25 % IV SOLN
25.0000 g | Freq: Two times a day (BID) | INTRAVENOUS | Status: AC
  Administered 2021-10-28 (×2): 25 g via INTRAVENOUS
  Filled 2021-10-28 (×2): qty 100

## 2021-10-28 MED ORDER — RIVASTIGMINE 4.6 MG/24HR TD PT24
4.6000 mg | MEDICATED_PATCH | Freq: Every day | TRANSDERMAL | Status: DC
  Administered 2021-10-28 – 2021-10-30 (×3): 4.6 mg via TRANSDERMAL
  Filled 2021-10-28 (×5): qty 1

## 2021-10-28 MED ORDER — GERHARDT'S BUTT CREAM
TOPICAL_CREAM | Freq: Four times a day (QID) | CUTANEOUS | Status: DC
  Administered 2021-10-30: 1 via TOPICAL
  Filled 2021-10-28 (×2): qty 1

## 2021-10-28 MED ORDER — SODIUM CHLORIDE 0.9% FLUSH: 10.0000 mL | INTRAVENOUS | Status: DC | PRN

## 2021-10-28 NOTE — Progress Notes (Signed)
PROGRESS NOTE    [name]  WUJ:[id number] DOB: Dec 27, 1945 DOA: 10/13/2021 PCP: Claretta Fraise, MD    Brief Narrative:  75 year old female with a history of atrial fibrillation, cor pulmonale, severe pulmonary hypertension, COPD, diabetes, who lives at home with her daughter and has been bedbound.  She has had multiple admissions to the hospital, primarily at [facility] for variety of issues.  She was recently discharged from the hospital approximately 2 days ago and records indicate that she was treated for volume overload and rapid atrial fibrillation.  She presents to [facility] with shortness of breath and by what daughter describes as worsening peripheral edema.  She is also had vomiting and decreased bowel movements for 2 days prior to admission.  She noted to be hypoxic on room air.  Chest x-ray showed some evidence of volume overload.   Assessment & Plan:   Principal Problem:   Acute respiratory failure with hypoxia (HCC) Active Problems:   Severe obesity (BMI >= 40) (HCC)   Atrial fibrillation, chronic (HCC)   Hypothyroidism   Current use of long term anticoagulation   Lymphedema of both lower extremities   Moderate to severe pulmonary hypertension (HCC)   Chronic indwelling Foley catheter   Sacral decubitus ulcer   Acute on chronic respiratory failure with hypoxia -I suspect this is related to volume overload from CHF -She was on oxygen at 2 L since 2012, recently taken off oxygen last few weeks to months -We will likely need to resume supplemental oxygen on discharge  Acute on chronic right-sided heart failure/cor pulmonale -Last echo from 2020 showed pulmonary pressures in the 80s and severe RV dysfunction -She was hospitalized at that time for volume overload, requiring pressors and milrinone as well as Lasix drip -Daughter reports that she has been taking torsemide 20 mg p.o. 3 times daily prior to admission -She has noticed worsening overall edema and  shortness of breath over the past few weeks -Clinically, she does appear to be volume overloaded -We will start on intravenous Lasix -Provide albumin prior to Lasix for diuresis -We will update echocardiogram -Check daily weights -Pending response to diuresis, may need to involve cardiology -Holding sildenafil for now due to low blood pressures  Hypotension -Blood pressure running low today in the 80s -Daughter reports that pressure chronically runs low, she is on 3 times daily midodrine -We will provide albumin infusions -Hold sildenafil -We will give small bolus of IV fluids since she does appear to be somnolent  Vomiting -Abdominal x-ray showed increased stool in rectum -Patient reports no bowel movement in several days -We will provide suppository -She is continued on MiraLAX twice daily  Atrial fibrillation, permanent -Chronically on diltiazem, will continue with holding parameters -She is anticoagulated with Eliquis  Chronic kidney disease stage IIIb -Creatinine currently at baseline, 1.1 on admission -Continue to follow with diuresis  Chronic anemia -No reported recent bleeding -Possible related to underlying renal disease -Hemoglobin is currently at baseline, 9.0  Obstructive sleep apnea COPD -Daughter reports that patient has not been using CPAP for several years now.  Reports that she was told that patient no longer needed CPAP -With significant right-sided heart failure, may be reasonable to repeat sleep study as an outpatient -She did not have any wheezing at this time  Sacral decubitus ulcers, candidal intertrigo -WOC consult  Chronic indwelling Foley catheter -Appears to have in place since patient is bedbound and concern for skin breakdown due to urinary incontinence -Continue for now  Questionable  bacteremia -Recent hospitalization at [facility] with 2 positive blood cultures, which may have been contaminants -She was discharged with  doxycycline -Blood cultures repeated on admission and are pending  Hypokalemia/hypomagnesemia -Replace  Diabetes -Holding home dose of Jardiance -Continue sliding scale insulin -A1c in process  Hypothyroidism -Continue Synthroid  Goals of care -Patient has significant chronic illnesses -Follow-up care with her primary physicians/cardiologist has been difficult since she is bedbound -Review of records indicate that majority of visits with providers have been virtual -Per daughter, patient was followed by hospice, but these services were discontinued in the last few months since her daughter felt that she did not need hospice care   DVT prophylaxis:  apixaban (ELIQUIS) tablet 5 mg  Code Status: DNR Family Communication: discussed with patient daughter over the phone Disposition Plan: Status is: Observation  The patient remains OBS appropriate and will d/c before 2 midnights.        Consultants:    Procedures:    Antimicrobials:  Doxycycline, continued from outpatient course    Subjective: Feels that nausea and vomiting is better. Has had little po intake. No bowel movement in several days. Feels that breathing has improved since admission  Objective: Vitals:   10/26/2021 1654 10/29/2021 2118 10/28/21 0527 10/28/21 1305  BP: (!) 120/53 (!) 100/45 (!) 101/50 (!) 88/55  Pulse: 85 69 66 78  Resp: 20 17 15 18   Temp: (!) 97.1 F (36.2 C) 97.6 F (36.4 C) 97.7 F (36.5 C) 97.6 F (36.4 C)  TempSrc: Oral Oral Oral Oral  SpO2: 100% 99% 100% 100%  Weight: 76.1 kg     Height: 5\' 6"  (1.676 m)       Intake/Output Summary (Last 24 hours) at 10/28/2021 1402 Last data filed at 10/28/2021 1303 Gross per 24 hour  Intake 476 ml  Output 225 ml  Net 251 ml   Filed Weights   10/21/2021 1224 10/26/2021 1654  Weight: 124 kg 76.1 kg    Examination:  General exam: Appears calm and comfortable  Respiratory system: diminished breath sounds at bases. Respiratory effort  normal. Cardiovascular system: S1 & S2 heard, RRR. No JVD, murmurs, rubs, gallops or clicks.  Gastrointestinal system: Abdomen is nondistended, soft and nontender. No organomegaly or masses felt. Normal bowel sounds heard. Central nervous system: Alert and oriented. No focal neurological deficits. Extremities: anasarca is present Skin: No rashes, lesions or ulcers Psychiatry: Judgement and insight appear normal. Mood & affect appropriate.     Data Reviewed: I have personally reviewed following labs and imaging studies  CBC: Recent Labs  Lab 10/04/2021 1254 10/28/21 0328  WBC 10.9* 9.7  NEUTROABS 9.2*  --   HGB 10.1* 9.0*  HCT 30.7* 27.5*  MCV 98.7 99.6  PLT 153 768*   Basic Metabolic Panel: Recent Labs  Lab 10/04/2021 1254 10/21/2021 1420 10/28/21 0328  NA 134*  --  136  K 3.2*  --  4.1  CL 100  --  103  CO2 21*  --  22  GLUCOSE 105*  --  86  BUN 22  --  23  CREATININE 1.10*  --  1.06*  CALCIUM 8.1*  --  8.1*  MG  --  1.5* 1.6*   GFR: Estimated Creatinine Clearance: 46.3 mL/min (A) (by C-G formula based on SCr of 1.06 mg/dL (H)). Liver Function Tests: Recent Labs  Lab 10/24/2021 1254  AST 18  ALT 14  ALKPHOS 197*  BILITOT 1.5*  PROT 4.7*  ALBUMIN 2.3*   No results  for input(s): LIPASE, AMYLASE in the last 168 hours. No results for input(s): AMMONIA in the last 168 hours. Coagulation Profile: No results for input(s): INR, PROTIME in the last 168 hours. Cardiac Enzymes: No results for input(s): CKTOTAL, CKMB, CKMBINDEX, TROPONINI in the last 168 hours. BNP (last 3 results) No results for input(s): PROBNP in the last 8760 hours. HbA1C: No results for input(s): HGBA1C in the last 72 hours. CBG: Recent Labs  Lab 10/12/2021 1649 10/22/2021 2115 10/28/21 0732 10/28/21 1126  GLUCAP 108* 117* 83 97   Lipid Profile: No results for input(s): CHOL, HDL, LDLCALC, TRIG, CHOLHDL, LDLDIRECT in the last 72 hours. Thyroid Function Tests: No results for input(s): TSH,  T4TOTAL, FREET4, T3FREE, THYROIDAB in the last 72 hours. Anemia Panel: No results for input(s): VITAMINB12, FOLATE, FERRITIN, TIBC, IRON, RETICCTPCT in the last 72 hours. Sepsis Labs: Recent Labs  Lab 10/19/2021 1254 10/01/2021 1420  LATICACIDVEN 1.5 1.6    Recent Results (from the past 240 hour(s))  Culture, blood (routine x 2)     Status: None (Preliminary result)   Collection Time: 10/08/2021 12:54 PM   Specimen: Right Antecubital; Blood  Result Value Ref Range Status   Specimen Description   Final    RIGHT ANTECUBITAL BOTTLES DRAWN AEROBIC AND ANAEROBIC   Special Requests Blood Culture adequate volume  Final   Culture   Final    NO GROWTH < 24 HOURS Performed at [facility], [address], [place], [place] [address]    Report Status PENDING  Incomplete  Culture, blood (routine x 2)     Status: None (Preliminary result)   Collection Time: 10/20/2021  1:17 PM   Specimen: BLOOD LEFT HAND  Result Value Ref Range Status   Specimen Description   Final    BLOOD LEFT HAND BOTTLES DRAWN AEROBIC AND ANAEROBIC   Special Requests   Final    Blood Culture results may not be optimal due to an inadequate volume of blood received in culture bottles   Culture   Final    NO GROWTH < 24 HOURS Performed at [facility], [address], [place], [place] [address]    Report Status PENDING  Incomplete  Resp Panel by RT-PCR (Flu A&B, Covid) Nasopharyngeal Swab     Status: None   Collection Time: 10/01/2021  2:21 PM   Specimen: Nasopharyngeal Swab; Nasopharyngeal(NP) swabs in vial transport medium  Result Value Ref Range Status   SARS Coronavirus 2 by RT PCR NEGATIVE NEGATIVE Final    Comment: (NOTE) SARS-CoV-2 target nucleic acids are NOT DETECTED.  The SARS-CoV-2 RNA is generally detectable in upper respiratory specimens during the acute phase of infection. The lowest concentration of SARS-CoV-2 viral copies this assay can detect is 138 copies/mL. A negative result does not preclude  SARS-Cov-2 infection and should not be used as the sole basis for treatment or other patient management decisions. A negative result may occur with  improper specimen collection/handling, submission of specimen other than nasopharyngeal swab, presence of viral mutation(s) within the areas targeted by this assay, and inadequate number of viral copies(<138 copies/mL). A negative result must be combined with clinical observations, patient history, and epidemiological information. The expected result is Negative.  Fact Sheet for Patients:  EntrepreneurPulse.com.au  Fact Sheet for Healthcare Providers:  IncredibleEmployment.be  This test is no t yet approved or cleared by the [place] FDA and  has been authorized for detection and/or diagnosis of SARS-CoV-2 by FDA under an Emergency Use Authorization (EUA). This EUA  will remain  in effect (meaning this test can be used) for the duration of the COVID-19 declaration under Section 564(b)(1) of the Act, 21 U.S.C.section 360bbb-3(b)(1), unless the authorization is terminated  or revoked sooner.       Influenza A by PCR NEGATIVE NEGATIVE Final   Influenza B by PCR NEGATIVE NEGATIVE Final    Comment: (NOTE) The Xpert Xpress SARS-CoV-2/FLU/RSV plus assay is intended as an aid in the diagnosis of influenza from Nasopharyngeal swab specimens and should not be used as a sole basis for treatment. Nasal washings and aspirates are unacceptable for Xpert Xpress SARS-CoV-2/FLU/RSV testing.  Fact Sheet for Patients: EntrepreneurPulse.com.au  Fact Sheet for Healthcare Providers: IncredibleEmployment.be  This test is not yet approved or cleared by the [place] FDA and has been authorized for detection and/or diagnosis of SARS-CoV-2 by FDA under an Emergency Use Authorization (EUA). This EUA will remain in effect (meaning this test can be used) for the duration of  the COVID-19 declaration under Section 564(b)(1) of the Act, 21 U.S.C. section 360bbb-3(b)(1), unless the authorization is terminated or revoked.  Performed at [facility], [address], [place], [place] [address]          Radiology Studies: DG Abd 1 View  Result Date: 10/28/2021 CLINICAL DATA:  Vomiting this morning, history atrial fibrillation, coronary artery disease, CHF, COPD, diabetes mellitus, hypertension EXAM: ABDOMEN - 1 VIEW COMPARISON:  Portable exam 1711 hours compared to 05/30/2020 FINDINGS: Nonobstructive bowel gas pattern. Increased stool in rectum. No bowel dilatation or bowel wall thickening. Osseous demineralization. Prior ORIF LEFT femur. IMPRESSION: Increased stool in rectum. Electronically Signed   By: Lavonia Dana M.D.   On: 10/03/2021 17:34   DG Chest Portable 1 View  Result Date: 10/18/2021 CLINICAL DATA:  Increasing shortness of breath beginning yesterday. EXAM: PORTABLE CHEST 1 VIEW COMPARISON:  10/25/2021 and older studies. FINDINGS: Cardiac silhouette is mildly enlarged. No mediastinal or hilar masses. Chronic thickening of the interstitial markings as well as chronic perihilar and lung base bronchitic changes. Opacity extends along the minor fissure and there is opacity at both lung bases consistent with a combination of small effusions and atelectasis. These findings are all similar to the prior study allowing for differences in patient positioning and technique. No pneumothorax. IMPRESSION: 1. No significant change from the recent prior study. Lung base opacities are noted consistent with a combination of small effusions and atelectasis. No convincing pneumonia or pulmonary edema. Electronically Signed   By: Lajean Manes M.D.   On: 10/09/2021 13:39        Scheduled Meds:  apixaban  5 mg Oral BID   diltiazem  120 mg Oral Daily   doxycycline  100 mg Oral BID   furosemide  20 mg Intravenous BID   insulin aspart  0-5 Units Subcutaneous QHS   insulin  aspart  0-9 Units Subcutaneous TID WC   levothyroxine  50 mcg Oral Daily   midodrine  10 mg Oral TID   nystatin   Topical TID   polyethylene glycol  17 g Oral BID   sodium chloride flush  10-40 mL Intracatheter Q12H   Continuous Infusions:  albumin human 25 g (10/28/21 1341)     LOS: 0 days    Time spent: 11mins    Kathie Dike, MD [facility]   If 7PM-7AM, please contact night-coverage [email or web address]  10/28/2021, 2:02 PM

## 2021-10-28 NOTE — Care Management Obs Status (Signed)
[place] NOTIFICATION   Patient Details  Name: [name] MRN: [id number] Date of Birth: 1945/12/27   Medicare Observation Status Notification Given:  Yes    Tommy Medal 10/28/2021, 4:08 PM

## 2021-10-28 NOTE — Progress Notes (Signed)
*  PRELIMINARY RESULTS* Echocardiogram 2D Echocardiogram has been performed.  [name] 10/28/2021, 3:55 PM

## 2021-10-28 NOTE — Consult Note (Signed)
WOC Nurse Consult Note: Patient receiving care in AP 312. Reason for Consult: buttock rash Wound type: MASD to sacrum, coccyx, buttocks, posterior upper thighs Pressure Injury POA: Yes/No/NA Measurement: Wound bed: see photos in EMR Drainage (amount, consistency, odor)  Periwound: Dressing procedure/placement/frequency: Apply to buttocks, sacrum, posterior upper thighs, labia AFTER washing with soap and water and patting dry. Ordered for QID.  Additionally, from the photos in the record, there appear to be DTPIs to the left hip, that at this time, does not require any intervention other than off-loading.  [place] nurse will not follow at this time.  Please re-consult the [place] team if needed.  Val Riles, RN, MSN, CWOCN, CNS-BC, pager [phone]

## 2021-10-29 ENCOUNTER — Ambulatory Visit (INDEPENDENT_AMBULATORY_CARE_PROVIDER_SITE_OTHER): Payer: Medicare HMO | Admitting: Family Medicine

## 2021-10-29 DIAGNOSIS — I130 Hypertensive heart and chronic kidney disease with heart failure and stage 1 through stage 4 chronic kidney disease, or unspecified chronic kidney disease: Secondary | ICD-10-CM | POA: Diagnosis present

## 2021-10-29 DIAGNOSIS — R0902 Hypoxemia: Secondary | ICD-10-CM | POA: Diagnosis present

## 2021-10-29 DIAGNOSIS — I2781 Cor pulmonale (chronic): Secondary | ICD-10-CM | POA: Diagnosis present

## 2021-10-29 DIAGNOSIS — R1110 Vomiting, unspecified: Secondary | ICD-10-CM | POA: Diagnosis not present

## 2021-10-29 DIAGNOSIS — N1832 Chronic kidney disease, stage 3b: Secondary | ICD-10-CM | POA: Diagnosis present

## 2021-10-29 DIAGNOSIS — B372 Candidiasis of skin and nail: Secondary | ICD-10-CM | POA: Diagnosis present

## 2021-10-29 DIAGNOSIS — Z6841 Body Mass Index (BMI) 40.0 and over, adult: Secondary | ICD-10-CM | POA: Diagnosis not present

## 2021-10-29 DIAGNOSIS — I272 Pulmonary hypertension, unspecified: Secondary | ICD-10-CM

## 2021-10-29 DIAGNOSIS — E1122 Type 2 diabetes mellitus with diabetic chronic kidney disease: Secondary | ICD-10-CM | POA: Diagnosis present

## 2021-10-29 DIAGNOSIS — J441 Chronic obstructive pulmonary disease with (acute) exacerbation: Secondary | ICD-10-CM | POA: Diagnosis present

## 2021-10-29 DIAGNOSIS — I482 Chronic atrial fibrillation, unspecified: Secondary | ICD-10-CM | POA: Diagnosis not present

## 2021-10-29 DIAGNOSIS — I509 Heart failure, unspecified: Secondary | ICD-10-CM | POA: Diagnosis not present

## 2021-10-29 DIAGNOSIS — Z20822 Contact with and (suspected) exposure to covid-19: Secondary | ICD-10-CM | POA: Diagnosis present

## 2021-10-29 DIAGNOSIS — L89152 Pressure ulcer of sacral region, stage 2: Secondary | ICD-10-CM | POA: Diagnosis present

## 2021-10-29 DIAGNOSIS — I4821 Permanent atrial fibrillation: Secondary | ICD-10-CM | POA: Diagnosis present

## 2021-10-29 DIAGNOSIS — I5082 Biventricular heart failure: Secondary | ICD-10-CM | POA: Diagnosis present

## 2021-10-29 DIAGNOSIS — Z91199 Patient's noncompliance with other medical treatment and regimen due to unspecified reason: Secondary | ICD-10-CM

## 2021-10-29 DIAGNOSIS — Z978 Presence of other specified devices: Secondary | ICD-10-CM | POA: Diagnosis not present

## 2021-10-29 DIAGNOSIS — I959 Hypotension, unspecified: Secondary | ICD-10-CM | POA: Diagnosis not present

## 2021-10-29 DIAGNOSIS — J9601 Acute respiratory failure with hypoxia: Secondary | ICD-10-CM | POA: Diagnosis not present

## 2021-10-29 DIAGNOSIS — I2510 Atherosclerotic heart disease of native coronary artery without angina pectoris: Secondary | ICD-10-CM | POA: Diagnosis present

## 2021-10-29 DIAGNOSIS — Z515 Encounter for palliative care: Secondary | ICD-10-CM | POA: Diagnosis not present

## 2021-10-29 DIAGNOSIS — J9622 Acute and chronic respiratory failure with hypercapnia: Secondary | ICD-10-CM | POA: Diagnosis present

## 2021-10-29 DIAGNOSIS — L89226 Pressure-induced deep tissue damage of left hip: Secondary | ICD-10-CM | POA: Diagnosis present

## 2021-10-29 DIAGNOSIS — I5033 Acute on chronic diastolic (congestive) heart failure: Secondary | ICD-10-CM | POA: Diagnosis present

## 2021-10-29 DIAGNOSIS — J9621 Acute and chronic respiratory failure with hypoxia: Secondary | ICD-10-CM | POA: Diagnosis present

## 2021-10-29 DIAGNOSIS — Z66 Do not resuscitate: Secondary | ICD-10-CM | POA: Diagnosis present

## 2021-10-29 DIAGNOSIS — Z7189 Other specified counseling: Secondary | ICD-10-CM | POA: Diagnosis not present

## 2021-10-29 DIAGNOSIS — D649 Anemia, unspecified: Secondary | ICD-10-CM | POA: Diagnosis present

## 2021-10-29 DIAGNOSIS — E039 Hypothyroidism, unspecified: Secondary | ICD-10-CM | POA: Diagnosis present

## 2021-10-29 DIAGNOSIS — L89896 Pressure-induced deep tissue damage of other site: Secondary | ICD-10-CM | POA: Diagnosis present

## 2021-10-29 DIAGNOSIS — Z7901 Long term (current) use of anticoagulants: Secondary | ICD-10-CM | POA: Diagnosis not present

## 2021-10-29 LAB — CBC
HCT: 25.8 % — ABNORMAL LOW (ref 36.0–46.0)
Hemoglobin: 8.2 g/dL — ABNORMAL LOW (ref 12.0–15.0)
MCH: 32.7 pg (ref 26.0–34.0)
MCHC: 31.8 g/dL (ref 30.0–36.0)
MCV: 102.8 fL — ABNORMAL HIGH (ref 80.0–100.0)
Platelets: 130 10*3/uL — ABNORMAL LOW (ref 150–400)
RBC: 2.51 MIL/uL — ABNORMAL LOW (ref 3.87–5.11)
RDW: 16.8 % — ABNORMAL HIGH (ref 11.5–15.5)
WBC: 9.4 10*3/uL (ref 4.0–10.5)
nRBC: 0.3 % — ABNORMAL HIGH (ref 0.0–0.2)

## 2021-10-29 LAB — COMPREHENSIVE METABOLIC PANEL
ALT: 14 U/L (ref 0–44)
AST: 19 U/L (ref 15–41)
Albumin: 2.7 g/dL — ABNORMAL LOW (ref 3.5–5.0)
Alkaline Phosphatase: 154 U/L — ABNORMAL HIGH (ref 38–126)
Anion gap: 11 (ref 5–15)
BUN: 25 mg/dL — ABNORMAL HIGH (ref 8–23)
CO2: 19 mmol/L — ABNORMAL LOW (ref 22–32)
Calcium: 8.2 mg/dL — ABNORMAL LOW (ref 8.9–10.3)
Chloride: 103 mmol/L (ref 98–111)
Creatinine, Ser: 1.14 mg/dL — ABNORMAL HIGH (ref 0.44–1.00)
GFR, Estimated: 50 mL/min — ABNORMAL LOW (ref >60)
Glucose, Bld: 98 mg/dL (ref 70–99)
Potassium: 4.2 mmol/L (ref 3.5–5.1)
Sodium: 133 mmol/L — ABNORMAL LOW (ref 135–145)
Total Bilirubin: 1.4 mg/dL — ABNORMAL HIGH (ref 0.3–1.2)
Total Protein: 4.6 g/dL — ABNORMAL LOW (ref 6.5–8.1)

## 2021-10-29 LAB — GLUCOSE, CAPILLARY
Glucose-Capillary: 111 mg/dL — ABNORMAL HIGH (ref 70–99)
Glucose-Capillary: 130 mg/dL — ABNORMAL HIGH (ref 70–99)
Glucose-Capillary: 84 mg/dL (ref 70–99)
Glucose-Capillary: 140 mg/dL — ABNORMAL HIGH (ref 70–99)

## 2021-10-29 MED ORDER — FUROSEMIDE 10 MG/ML IJ SOLN
60.0000 mg | Freq: Two times a day (BID) | INTRAMUSCULAR | Status: DC
  Administered 2021-10-29: 60 mg via INTRAVENOUS

## 2021-10-29 MED ORDER — SORBITOL 70 % SOLN
960.0000 mL | TOPICAL_OIL | Freq: Once | ORAL | Status: AC
  Administered 2021-10-29: 960 mL via RECTAL
  Filled 2021-10-29: qty 473

## 2021-10-29 MED ORDER — FUROSEMIDE 10 MG/ML IJ SOLN: 80.0000 mg | Freq: Two times a day (BID) | INTRAMUSCULAR | Status: DC

## 2021-10-29 MED ORDER — DILTIAZEM HCL 30 MG PO TABS
30.0000 mg | ORAL_TABLET | Freq: Four times a day (QID) | ORAL | Status: DC
  Administered 2021-10-30: 30 mg via ORAL
  Filled 2021-10-29 (×2): qty 1

## 2021-10-29 NOTE — Progress Notes (Signed)
No answer. Called 3X, 2 different numbers plus video connect attempted

## 2021-10-29 NOTE — Progress Notes (Signed)
PROGRESS NOTE    Wendy DERDEN  GXQ:[id number] DOB: Dec 21, 1945 DOA: 10/12/2021 PCP: Claretta Fraise, MD    Brief Narrative:  75 year old female with a history of atrial fibrillation, cor pulmonale, severe pulmonary hypertension, COPD, diabetes, who lives at home with her daughter and has been bedbound.  She has had multiple admissions to the hospital, primarily at [facility] for variety of issues.  She was recently discharged from the hospital approximately 2 days ago and records indicate that she was treated for volume overload and rapid atrial fibrillation.  She presents to [facility] with shortness of breath and by what daughter describes as worsening peripheral edema.  She is also had vomiting and decreased bowel movements for 2 days prior to admission.  She noted to be hypoxic on room air.  Chest x-ray showed some evidence of volume overload.   Assessment & Plan:   Principal Problem:   Acute respiratory failure with hypoxia (HCC) Active Problems:   Severe obesity (BMI >= 40) (HCC)   Atrial fibrillation, chronic (HCC)   Hypothyroidism   Current use of long term anticoagulation   Lymphedema of both lower extremities   Moderate to severe pulmonary hypertension (HCC)   Chronic indwelling Foley catheter   Sacral decubitus ulcer   Cor pulmonale (HCC)   Acute on chronic respiratory failure with hypoxia -I suspect this is related to volume overload from CHF -She was on oxygen at 2 L since 2012, recently taken off oxygen last few weeks to months -We will likely need to resume supplemental oxygen on discharge  Acute on chronic right-sided heart failure/cor pulmonale -Last echo from 2020 showed pulmonary pressures in the 80s and severe RV dysfunction -She was hospitalized at that time for volume overload, requiring pressors and milrinone as well as Lasix drip -Daughter reports that she has been taking torsemide 20 mg p.o. 3 times daily prior to admission -She has noticed  worsening overall edema and shortness of breath over the past few weeks -Clinically, she does appear to be volume overloaded -She received albumin and IV Lasix on 11/28, but did not seem to have significant urine output -Echocardiogram shows EF of 60 to 65% enlarged right ventricle and PA pressure of 66 mmHg -Check daily weights -We will request cardiology consultation since she may need Lasix infusion.  Unsure if she would benefit from repeat evaluation by advanced heart failure team. -Holding sildenafil for now due to low blood pressures  Hypotension -Daughter reports that pressure chronically runs low, she is on 3 times daily midodrine -She was provided albumin infusions on 11/28 -Hold sildenafil -Blood pressures appear to be doing better today  Vomiting -Abdominal x-ray showed increased stool in rectum -Patient reports no bowel movement in several days -She is continued on MiraLAX twice daily  Atrial fibrillation, permanent -Chronically on diltiazem, will continue with holding parameters -She is anticoagulated with Eliquis  Chronic kidney disease stage IIIb -Creatinine currently at baseline, 1.1 on admission -Continue to follow with diuresis  Chronic anemia -No reported recent bleeding -Possible related to underlying renal disease -Baseline hemoglobin of 9 -Currently hemoglobin 8.2, continue to monitor  Obstructive sleep apnea COPD -Daughter reports that patient has not been using CPAP for several years now.  Reports that she was told that patient no longer needed CPAP -With significant right-sided heart failure, may be reasonable to repeat sleep study as an outpatient -She did not have any wheezing at this time  Sacral decubitus ulcers, candidal intertrigo -WOC consult, appreciate input  Chronic  indwelling Foley catheter -Appears to have in place since patient is bedbound and concern for skin breakdown due to urinary incontinence -Continue for now  Questionable  bacteremia -Recent hospitalization at [facility], [facility], [facility] with 2 positive blood cultures for coagulase-negative staph pettenkoferi, which may have been contaminants -She was discharged with doxycycline for a total of 14 days which should be completed on 12/9 -Blood cultures repeated on admission and are pending  Hypokalemia/hypomagnesemia -Replace  Diabetes -Holding home dose of Jardiance -Continue sliding scale insulin -A1c 6.4  Hypothyroidism -Continue Synthroid  Goals of care -Patient has significant chronic illnesses -Follow-up care with her primary physicians/cardiologist has been difficult since she is bedbound -Review of records indicate that majority of visits with providers have been virtual -Per daughter, patient was followed by hospice, but these services were discontinued in the last few months since her daughter felt that she did not need hospice care -She is however DNR   DVT prophylaxis:  apixaban (ELIQUIS) tablet 5 mg  Code Status: DNR Family Communication: discussed with patient daughter over the phone 11/28.  Unable to reach her on the phone 11/29 Disposition Plan: Status is: Inpatient  The patient will require care spanning > 2 midnights and should be moved to inpatient because: Continue diuresis for volume overload        Consultants:  Cardiology  Procedures:    Antimicrobials:  Doxycycline, continued from outpatient course    Subjective: Feels that her breathing is little better today.  Urine output has been unimpressive since yesterday.  Objective: Vitals:   10/29/21 0612 10/29/21 0900 10/29/21 1214 10/29/21 1435  BP: (!) 96/58 (!) 114/48  130/65  Pulse: (!) 51 (!) 52  (!) 55  Resp: 18 18  17   Temp: (!) 97.5 F (36.4 C) 97.8 F (36.6 C)  98 F (36.7 C)  TempSrc: Oral Axillary  Oral  SpO2: 100% 100% 98% 100%  Weight:      Height:        Intake/Output Summary (Last 24 hours) at 10/29/2021 1458 Last data filed at 10/29/2021 0900 Gross  per 24 hour  Intake 1454.83 ml  Output 150 ml  Net 1304.83 ml   Filed Weights   10/26/2021 1224 10/04/2021 1654  Weight: 124 kg 76.1 kg    Examination:  General exam: Appears calm and comfortable  Respiratory system: diminished breath sounds at bases. Respiratory effort normal. Cardiovascular system: S1 & S2 heard, RRR. No JVD, murmurs, rubs, gallops or clicks.  Gastrointestinal system: Abdomen is nondistended, soft and nontender. No organomegaly or masses felt. Normal bowel sounds heard. Central nervous system: Alert and oriented. No focal neurological deficits. Extremities: Patient has significant lower extremity and upper extremity edema Skin: No rashes, lesions or ulcers Psychiatry: Judgement and insight appear normal. Mood & affect appropriate.     Data Reviewed: I have personally reviewed following labs and imaging studies  CBC: Recent Labs  Lab 10/03/2021 1254 10/28/21 0328 10/29/21 0701  WBC 10.9* 9.7 9.4  NEUTROABS 9.2*  --   --   HGB 10.1* 9.0* 8.2*  HCT 30.7* 27.5* 25.8*  MCV 98.7 99.6 102.8*  PLT 153 146* 646*   Basic Metabolic Panel: Recent Labs  Lab 10/28/2021 1254 10/23/2021 1420 10/28/21 0328 10/29/21 0517  NA 134*  --  136 133*  K 3.2*  --  4.1 4.2  CL 100  --  103 103  CO2 21*  --  22 19*  GLUCOSE 105*  --  86 98  BUN 22  --  23 25*  CREATININE 1.10*  --  1.06* 1.14*  CALCIUM 8.1*  --  8.1* 8.2*  MG  --  1.5* 1.6*  --    GFR: Estimated Creatinine Clearance: 43.1 mL/min (A) (by C-G formula based on SCr of 1.14 mg/dL (H)). Liver Function Tests: Recent Labs  Lab 10/09/2021 1254 10/29/21 0517  AST 18 19  ALT 14 14  ALKPHOS 197* 154*  BILITOT 1.5* 1.4*  PROT 4.7* 4.6*  ALBUMIN 2.3* 2.7*   No results for input(s): LIPASE, AMYLASE in the last 168 hours. No results for input(s): AMMONIA in the last 168 hours. Coagulation Profile: No results for input(s): INR, PROTIME in the last 168 hours. Cardiac Enzymes: No results for input(s): CKTOTAL, CKMB,  CKMBINDEX, TROPONINI in the last 168 hours. BNP (last 3 results) No results for input(s): PROBNP in the last 8760 hours. HbA1C: Recent Labs    10/28/21 0328  HGBA1C 6.4*   CBG: Recent Labs  Lab 10/28/21 1126 10/28/21 1658 10/28/21 2130 10/29/21 0720 10/29/21 1128  GLUCAP 97 111* 105* 111* 130*   Lipid Profile: No results for input(s): CHOL, HDL, LDLCALC, TRIG, CHOLHDL, LDLDIRECT in the last 72 hours. Thyroid Function Tests: No results for input(s): TSH, T4TOTAL, FREET4, T3FREE, THYROIDAB in the last 72 hours. Anemia Panel: No results for input(s): VITAMINB12, FOLATE, FERRITIN, TIBC, IRON, RETICCTPCT in the last 72 hours. Sepsis Labs: Recent Labs  Lab 10/26/2021 1254 10/19/2021 1420  LATICACIDVEN 1.5 1.6    Recent Results (from the past 240 hour(s))  Culture, blood (routine x 2)     Status: None (Preliminary result)   Collection Time: 10/30/2021 12:54 PM   Specimen: Right Antecubital; Blood  Result Value Ref Range Status   Specimen Description   Final    RIGHT ANTECUBITAL BOTTLES DRAWN AEROBIC AND ANAEROBIC   Special Requests Blood Culture adequate volume  Final   Culture   Final    NO GROWTH 2 DAYS Performed at [facility], [address] [place], [place] [address]    Report Status PENDING  Incomplete  Culture, blood (routine x 2)     Status: None (Preliminary result)   Collection Time: 10/09/2021  1:17 PM   Specimen: BLOOD LEFT HAND  Result Value Ref Range Status   Specimen Description   Final    BLOOD LEFT HAND BOTTLES DRAWN AEROBIC AND ANAEROBIC   Special Requests   Final    Blood Culture results may not be optimal due to an inadequate volume of blood received in culture bottles   Culture   Final    NO GROWTH 2 DAYS Performed at [facility], [address], [place], [place] [address]    Report Status PENDING  Incomplete  Resp Panel by RT-PCR (Flu A&B, Covid) Nasopharyngeal Swab     Status: None   Collection Time: 10/13/2021  2:21 PM   Specimen: Nasopharyngeal  Swab; Nasopharyngeal(NP) swabs in vial transport medium  Result Value Ref Range Status   SARS Coronavirus 2 by RT PCR NEGATIVE NEGATIVE Final    Comment: (NOTE) SARS-CoV-2 target nucleic acids are NOT DETECTED.  The SARS-CoV-2 RNA is generally detectable in upper respiratory specimens during the acute phase of infection. The lowest concentration of SARS-CoV-2 viral copies this assay can detect is 138 copies/mL. A negative result does not preclude SARS-Cov-2 infection and should not be used as the sole basis for treatment or other patient management decisions. A negative result may occur with  improper specimen collection/handling, submission of specimen other than nasopharyngeal  swab, presence of viral mutation(s) within the areas targeted by this assay, and inadequate number of viral copies(<138 copies/mL). A negative result must be combined with clinical observations, patient history, and epidemiological information. The expected result is Negative.  Fact Sheet for Patients:  EntrepreneurPulse.com.au  Fact Sheet for Healthcare Providers:  IncredibleEmployment.be  This test is no t yet approved or cleared by the [place] FDA and  has been authorized for detection and/or diagnosis of SARS-CoV-2 by FDA under an Emergency Use Authorization (EUA). This EUA will remain  in effect (meaning this test can be used) for the duration of the COVID-19 declaration under Section 564(b)(1) of the Act, 21 U.S.C.section 360bbb-3(b)(1), unless the authorization is terminated  or revoked sooner.       Influenza A by PCR NEGATIVE NEGATIVE Final   Influenza B by PCR NEGATIVE NEGATIVE Final    Comment: (NOTE) The Xpert Xpress SARS-CoV-2/FLU/RSV plus assay is intended as an aid in the diagnosis of influenza from Nasopharyngeal swab specimens and should not be used as a sole basis for treatment. Nasal washings and aspirates are unacceptable for Xpert Xpress  SARS-CoV-2/FLU/RSV testing.  Fact Sheet for Patients: EntrepreneurPulse.com.au  Fact Sheet for Healthcare Providers: IncredibleEmployment.be  This test is not yet approved or cleared by the [place] FDA and has been authorized for detection and/or diagnosis of SARS-CoV-2 by FDA under an Emergency Use Authorization (EUA). This EUA will remain in effect (meaning this test can be used) for the duration of the COVID-19 declaration under Section 564(b)(1) of the Act, 21 U.S.C. section 360bbb-3(b)(1), unless the authorization is terminated or revoked.  Performed at [facility], [address], [place], [place] [address]          Radiology Studies: DG Abd 1 View  Result Date: 10/28/2021 CLINICAL DATA:  Vomiting this morning, history atrial fibrillation, coronary artery disease, CHF, COPD, diabetes mellitus, hypertension EXAM: ABDOMEN - 1 VIEW COMPARISON:  Portable exam 1711 hours compared to 05/30/2020 FINDINGS: Nonobstructive bowel gas pattern. Increased stool in rectum. No bowel dilatation or bowel wall thickening. Osseous demineralization. Prior ORIF LEFT femur. IMPRESSION: Increased stool in rectum. Electronically Signed   By: Lavonia Dana M.D.   On: 10/21/2021 17:34   ECHOCARDIOGRAM COMPLETE  Result Date: 10/28/2021    ECHOCARDIOGRAM REPORT   Patient Name:   GLEE LASHOMB Date of Exam: 10/28/2021 Medical Rec #:  [id number]         Height:       66.0 in Accession #:    [id number]        Weight:       167.8 lb Date of Birth:  07-07-1946         BSA:          1.856 m Patient Age:    59 years          BP:           88/55 mmHg Patient Gender: F                 HR:           78 bpm. Exam Location:  [facility] Procedure: 2D Echo, Cardiac Doppler and Color Doppler Indications:    Congestive Heart Failure I50.9  History:        Patient has prior history of Echocardiogram examinations, most                 recent 10/13/2019. CHF, CAD, TIA and COPD,  Arrythmias:Atrial  Fibrillation, Signs/Symptoms:Dyspnea; Risk Factors:Dyslipidemia,                 Diabetes and Non-Smoker. Pulmonary HTN, Current use of long term                 anticoagulation.  Sonographer:    Alvino Chapel RCS Referring Phys: [place]  1. Left ventricular ejection fraction, by estimation, is 60 to 65%. The left ventricle has normal function. The left ventricle has no regional wall motion abnormalities. Diastolic function indeterminant due to atrial fibrillation.  2. Right ventricular systolic function is normal. The right ventricular size is moderately enlarged. There is severely elevated pulmonary artery systolic pressure. The estimated right ventricular systolic pressure is 65.6 mmHg.  3. Left atrial size was severely dilated.  4. Right atrial size was severely dilated.  5. The mitral valve is normal in structure. Trivial mitral valve regurgitation.  6. Tricuspid valve regurgitation is mild to moderate.  7. The aortic valve is tricuspid. There is mild calcification of the aortic valve. There is mild thickening of the aortic valve. Aortic valve regurgitation is trivial. Aortic valve sclerosis is present, with no evidence of aortic valve stenosis.  8. The inferior vena cava is dilated in size with <50% respiratory variability, suggesting right atrial pressure of 15 mmHg. Comparison(s): Compared to prior TTE in 2020, there continues to be severe pulmonary hypertension with PASP 25mmHg on current study (previously 52mmHg). FINDINGS  Left Ventricle: Left ventricular ejection fraction, by estimation, is 60 to 65%. The left ventricle has normal function. The left ventricle has no regional wall motion abnormalities. The left ventricular internal cavity size was normal in size. There is  no left ventricular hypertrophy. Diastolic function indeterminant due to atrial fibrillation. Right Ventricle: The right ventricular size is moderately enlarged. No increase in  right ventricular wall thickness. Right ventricular systolic function is normal. There is severely elevated pulmonary artery systolic pressure. The tricuspid regurgitant velocity is 3.57 m/s, and with an assumed right atrial pressure of 15 mmHg, the estimated right ventricular systolic pressure is 81.2 mmHg. Left Atrium: Left atrial size was severely dilated. Right Atrium: Right atrial size was severely dilated. Pericardium: There is no evidence of pericardial effusion. Mitral Valve: The mitral valve is normal in structure. There is mild thickening of the mitral valve leaflet(s). There is mild calcification of the mitral valve leaflet(s). Mild mitral annular calcification. Trivial mitral valve regurgitation. Tricuspid Valve: The tricuspid valve is normal in structure. Tricuspid valve regurgitation is mild to moderate. Aortic Valve: The aortic valve is tricuspid. There is mild calcification of the aortic valve. There is mild thickening of the aortic valve. Aortic valve regurgitation is trivial. Aortic valve sclerosis is present, with no evidence of aortic valve stenosis. Pulmonic Valve: The pulmonic valve was normal in structure. Pulmonic valve regurgitation is trivial. Aorta: The aortic root is normal in size and structure. Venous: The inferior vena cava is dilated in size with less than 50% respiratory variability, suggesting right atrial pressure of 15 mmHg. IAS/Shunts: No atrial level shunt detected by color flow Doppler.  LEFT VENTRICLE PLAX 2D LVIDd:         4.70 cm LVIDs:         3.10 cm LV PW:         1.10 cm LV IVS:        0.90 cm LVOT diam:     1.90 cm LV SV:         46 LV SV  Index:   25 LVOT Area:     2.84 cm  RIGHT VENTRICLE TAPSE (M-mode): 1.5 cm LEFT ATRIUM              Index        RIGHT ATRIUM           Index LA diam:        4.80 cm  2.59 cm/m   RA Area:     36.20 cm LA Vol (A2C):   102.0 ml 54.96 ml/m  RA Volume:   143.00 ml 77.05 ml/m LA Vol (A4C):   81.1 ml  43.70 ml/m LA Biplane Vol: 97.9 ml   52.75 ml/m  AORTIC VALVE LVOT Vmax:   105.00 cm/s LVOT Vmean:  63.600 cm/s LVOT VTI:    0.162 m  AORTA Ao Root diam: 3.40 cm MITRAL VALVE                TRICUSPID VALVE MV Area (PHT): 3.53 cm     TR Peak grad:   51.0 mmHg MV Decel Time: 215 msec     TR Vmax:        357.00 cm/s MV E velocity: 121.00 cm/s                             SHUNTS                             Systemic VTI:  0.16 m                             Systemic Diam: 1.90 cm Gwyndolyn Kaufman MD Electronically signed by Gwyndolyn Kaufman MD Signature Date/Time: 10/28/2021/4:27:56 PM    Final         Scheduled Meds:  apixaban  5 mg Oral BID   diltiazem  120 mg Oral Daily   doxycycline  100 mg Oral BID   furosemide  20 mg Intravenous BID   Gerhardt's butt cream   Topical QID   insulin aspart  0-5 Units Subcutaneous QHS   insulin aspart  0-9 Units Subcutaneous TID WC   levothyroxine  50 mcg Oral Daily   midodrine  10 mg Oral TID   nystatin   Topical TID   polyethylene glycol  17 g Oral BID   rivastigmine  4.6 mg Transdermal Daily   sodium chloride flush  10-40 mL Intracatheter Q12H   Continuous Infusions:     LOS: 0 days    Time spent: 87mins    Kathie Dike, MD [facility]   If 7PM-7AM, please contact night-coverage [email or web address]  10/29/2021, 2:58 PM

## 2021-10-29 NOTE — Consult Note (Addendum)
Cardiology Consultation:   Patient ID: [name] MRN: [id number]; DOB: 1946/11/06  Admit date: 10/10/2021 Date of Consult: 10/29/2021  PCP:  Wendy Fraise, [name]   [facility] Providers Cardiologist:  Wendy Breeding, [name]  AHF: [name]  Patient Profile:   [name] is a 75 y.o. female with a hx of HFrEF, pulmonary HTN, permanent atrial fibrillation, Stage 3-4 CKD, Type 2 DM and chronic hypoxic respiratory failure who is being seen 10/29/2021 for the evaluation of right-heart failure and pulmonary HTN at the request of [name].  History of Present Illness:   [name] most recently had a telehealth visit with [name] on 09/25/2021 and remained homebound at that time by review of notes. She was under hospice care and her visit was conducted via telehealth. Denied any acute respiratory issues at that time and was continued on her current dose of Torsemide 25m daily and Sildenafil 293mdaily while continuing on Midodrine 10 mg 3 times daily.  [facility] for evaluation of worsening dyspnea for the past several days. O2 saturations had been in the 70's on RA upon EMS arrival. By review of records, [facility] for acute on chronic hypoxic respiratory failure in the setting of severe pulmonary hypertension. Was also in atrial fibrillation with RVR during that admission which improved prior to discharge. Was also treated for E. coli UTI and gram-positive bacteremia during admission.   Initial labs this admission showed WBC 10.9, Hgb 10.1, platelets 153, Na+ 134, K+ 3.2 and creatinine 1.10. AST 18, ALT 14 and Alk Phos 197. BNP 495. Lactic Acid 1.6. COVID negative. CXR showing lung base opacities consistent with small effusions or atelectasis. EKG showing rate-controlled atrial fibrillation, HR 76 with low-voltage. Repeat echo shows a preserved EF of 60 to 65% with no regional motion  normalities. She does have normal RV function but RV was moderately enlarged. Also noted to have severely elevated PASP of 66 mmHg. She had severe biatrial dilation and mild to moderate TR.  She was admitted for diuresis and has been started on IV Lasix 20 mg twice daily with minimal output. Overall, she is reported as being + 1.5 L this admission. Variable weights recorded and not accurate. Her Sildenafil has been held since admission given intermittent hypotension.  Creatinine remains stable, at 1.14 this AM.  Currently, she states she feels overall okay. Denies current shortness of breath or chest pain.   Past Medical History:  Diagnosis Date   Acute on chronic diastolic (congestive) heart failure ([facility]11/10/2019   Anemia    Arthritis    Asthma    Atrial fibrillation (HCC)    CAD (coronary artery disease)    LAD 60% proximal stenosis mid and distal 60% stenosis 2009.   Cataract    had surgery   CHF (congestive heart failure) (HCC)    COPD (chronic obstructive pulmonary disease) (HCC)    Diabetes mellitus    Gait instability    uses cane   GERD (gastroesophageal reflux disease)    Hyperlipidemia    Hypertension    Obesity    Oxygen dependent    2 liter per nasal cannula   Sleep apnea, obstructive 2008   Stroke ([facility]   2 mini    Past Surgical History:  Procedure Laterality Date   APPENDECTOMY     CARPAL TUNNEL RELEASE     right hand   CATARACT EXTRACTION W/  INTRAOCULAR LENS  IMPLANT, BILATERAL     CERVICAL SPINE SURGERY     fusion   CESAREAN SECTION     CHOLECYSTECTOMY     COLONOSCOPY  11/22/2002   normal - WendyRrourk   COLONOSCOPY  11/26/2005   incomplete with negative BE (Dr. Rowe [name], [facility])   [place] IM NAIL Left 05/08/2013   Procedure: INTRAMEDULLARY (IM) NAIL FEMORAL--LONG(BIOMET SYSTEM) and Zimmer Cables;  Surgeon: [name];  Location: [place];  Service: Orthopedics;  Laterality: Left;   HIP SURGERY Left    KNEE ARTHROSCOPY     twice,  left   RIGHT HEART CATH N/A 10/13/2019   Procedure: [name] Placement;  Surgeon: [name];  Location: [place] [facility];  Service: Cardiovascular;  Laterality: N/A;   UMBILICAL HERNIA REPAIR     UPPER GASTROINTESTINAL ENDOSCOPY  11/22/2002   Normal - Dr. Gala [name]     Home Medications:  Prior to Admission medications   Medication Sig Start Date End Date Taking? Authorizing Provider  acetaminophen (TYLENOL) 500 MG tablet Take 500 mg by mouth every 4 (four) hours as needed for mild pain.    Yes Provider, Historical, [name]  apixaban (ELIQUIS) 5 MG TABS tablet TAKE ONE TABLET BY MOUTH TWICE DAILY 06/11/21  Yes Bensimhon, Shaune Pascal, [name]  diltiazem (TIAZAC) 120 MG 24 hr capsule Take 120 mg by mouth daily. 06/21/21 06/21/22 Yes Provider, Historical, [name]  doxycycline (VIBRAMYCIN) 100 MG capsule Take 100 mg by mouth 2 (two) times daily. 10/25/21  Yes Provider, Historical, [name]  furosemide (LASIX) 20 MG tablet Take 20 mg by mouth daily. 10/25/21  Yes Provider, Historical, [name]  JARDIANCE 10 MG TABS tablet Take 10 mg by mouth daily. 10/18/21  Yes Provider, Historical, [name]  levothyroxine (SYNTHROID) 50 MCG tablet Take 1 tablet by mouth daily. 01/08/21  Yes Provider, Historical, [name]  midodrine (PROAMATINE) 10 MG tablet TAKE ONE TABLET BY MOUTH THREE TIMES DAILY. NEEDS APPOINTMENT FOR FURTHER REFILLS. 08/01/21  Yes Bensimhon, Shaune Pascal, [name]  nystatin cream (MYCOSTATIN) Apply 1 application topically 2 (two) times daily. Patient taking differently: Apply 1 application topically 2 (two) times daily as needed. 06/07/21  Yes Hassell [name], Mary-Margaret, FNP  ondansetron (ZOFRAN-ODT) 8 MG disintegrating tablet Take 1 tablet (8 mg total) by mouth every 6 (six) hours as needed for nausea or vomiting. 05/28/20  Yes [name], Wendy Gash, [name]  polyethylene glycol (MIRALAX / GLYCOLAX) 17 g packet Take 17 g by mouth 2 (two) times daily. Patient taking differently: Take 17 g by mouth 2 (two) times daily as needed. 11/26/20  Yes Wendy Fraise, [name]   sildenafil (REVATIO) 20 MG tablet Take 2 tablets (40 mg total) by mouth 3 (three) times daily. 11/08/19  Yes Clegg, Amy D, NP  traZODone (DESYREL) 50 MG tablet Take 50 mg by mouth at bedtime as needed. 05/23/21  Yes Provider, Historical, [name]  Accu-Chek Softclix Lancets lancets Check BS BID Dx E11.9 09/12/21   Wendy Fraise, [name]  albuterol (PROVENTIL) (2.5 MG/3ML) 0.083% nebulizer solution INHALE THE CONTENTS OF 1 VIAL VIA NEBULIZER EVERY 6 HOURS AS NEEDED FOR WHEEZING OR SHORTNESS OF BREATH Patient not taking: Reported on 10/21/2021 11/13/20   Wendy Fraise, [name]  albuterol (VENTOLIN HFA) 108 (90 Base) MCG/ACT inhaler INHALE 1 PUFF BY MOUTH EVERY 6 HOURS AS NEEDED FOR SHORTNESS OF BREATH Patient not taking: Reported on 10/10/2021 01/08/21   Wendy Fraise, [name]  Blood Glucose Monitoring Suppl (ACCU-CHEK GUIDE) w/Device KIT Check BS 2 times  daily Dx E11.9 09/18/21   Wendy Fraise, [name]  Catheters (CATHETER RED RUBBER COATED) MISC Pure Elza Rafter brand is preferred. Use every 4 hours as needed for urinary incontinence. 06/29/20   Wendy Fraise, [name]  diclofenac Sodium (VOLTAREN) 1 % GEL  06/21/21   Provider, Historical, [name]  gabapentin (NEURONTIN) 300 MG capsule Take 1 capsule (300 mg total) by mouth at bedtime. (NEEDS TO BE SEEN BEFORE NEXT REFILL) Patient not taking: Reported on 10/16/2021 07/19/21   Wendy Fraise, [name]  GLOBAL EASE INJECT PEN NEEDLES 32G X 4 MM MISC  09/04/21   Provider, Historical, [name]  glucose blood (ACCU-CHEK GUIDE) test strip Check BS BID Dx E11.9 09/12/21   Wendy Fraise, [name]  ipratropium-albuterol (DUONEB) 0.5-2.5 (3) MG/3ML SOLN INHALE THE CONTENTS OF 1 VIAL VIA NEBULIZER EVERY 6 HOURS AS NEEDED FOR SHORTNESS OF BREATH Patient not taking: Reported on 10/10/2021 03/08/21   Wendy Fraise, [name]  levothyroxine (SYNTHROID) 50 MCG tablet TAKE ONE TABLET BY MOUTH DAILY Patient not taking: Reported on 10/20/2021 01/08/21   Wendy Fraise, [name]  potassium chloride SA (KLOR-CON) 20 MEQ tablet TAKE ONE TABLET BY  MOUTH DAILY Patient not taking: Reported on 10/03/2021 10/24/20   Bensimhon, Shaune Pascal, [name]  promethazine (PHENERGAN) 25 MG tablet  07/13/21   Provider, Historical, [name]  rivastigmine (EXELON) 4.6 mg/24hr Place 1 patch (4.6 mg total) onto the skin daily. Patient not taking: Reported on 10/26/2021 07/19/21   Wendy Fraise, [name]  spironolactone (ALDACTONE) 25 MG tablet Take 0.5 tablets (12.5 mg total) by mouth daily. Patient not taking: Reported on 10/22/2021 06/11/21   Bensimhon, Shaune Pascal, [name]  sulfamethoxazole-trimethoprim (BACTRIM DS) 800-160 MG tablet Take 1 tablet by mouth 2 (two) times daily. Patient not taking: Reported on 10/26/2021 10/02/21   Wendy Fraise, [name]  torsemide (DEMADEX) 20 MG tablet Take 1 tablet (20 mg total) by mouth daily. Patient not taking: Reported on 10/04/2021 07/19/21   Wendy Fraise, [name]  vitamin B-12 (CYANOCOBALAMIN) 500 MCG tablet Take 500 mcg by mouth daily. Patient not taking: Reported on 10/22/2021    Provider, Historical, [name]    Inpatient Medications: Scheduled Meds:  apixaban  5 mg Oral BID   diltiazem  120 mg Oral Daily   doxycycline  100 mg Oral BID   furosemide  20 mg Intravenous BID   Gerhardt's butt cream   Topical QID   insulin aspart  0-5 Units Subcutaneous QHS   insulin aspart  0-9 Units Subcutaneous TID WC   levothyroxine  50 mcg Oral Daily   midodrine  10 mg Oral TID   nystatin   Topical TID   polyethylene glycol  17 g Oral BID   rivastigmine  4.6 mg Transdermal Daily   sodium chloride flush  10-40 mL Intracatheter Q12H   sorbitol, milk of mag, mineral oil, glycerin (SMOG) enema  960 mL Rectal Once   Continuous Infusions:  PRN Meds: acetaminophen **OR** acetaminophen, ipratropium-albuterol, promethazine, sodium chloride flush  Allergies:    Allergies  Allergen Reactions   Lisinopril     R/t to kidney   Codeine Nausea And Vomiting    Social History:   Social History   Socioeconomic History   Marital status: Widowed    Spouse name: Not  on file   Number of children: 1   Years of education: 7th   Highest education level: 7th grade  Occupational History   Occupation: Disabled   Occupation: Retired    Comment: Herbalist  Tobacco Use   Smoking  status: Never   Smokeless tobacco: Never  Vaping Use   Vaping Use: Never used  Substance and Sexual Activity   Alcohol use: No   Drug use: No   Sexual activity: Not Currently  Other Topics Concern   Not on file  Social History Narrative   She is bedridden -lives with her granddaughter and her husband   Social Determinants of Radio broadcast assistant Strain: Not on file  Food Insecurity: Not on file  Transportation Needs: Not on file  Physical Activity: Not on file  Stress: No Stress Concern Present   Feeling of Stress : Not at all  Social Connections: Not on file  Intimate Partner Violence: Not on file    Family History:    Family History  Problem Relation Age of Onset   Colon cancer Mother    Heart attack Mother    Alzheimer's disease Mother        father   Diabetes Mother    Heart disease Mother    Cancer Mother        colon   Kidney disease Mother    Cancer Sister        brain and spine   Migraines Sister    Alzheimer's disease Father    Cancer Brother        stomach   Diabetes Sister    Cancer Sister        breast   Heart disease Sister    Heart attack Sister    Breast cancer Sister    Heart disease Brother    Heart murmur Brother    Diabetes Other    Heart attack Other    Diabetes Daughter    Asthma Daughter    Asthma Grandchild      ROS:  Please see the history of present illness.   All other ROS reviewed and negative.     Physical Exam/Data:   Vitals:   10/29/21 0900 10/29/21 1214 10/29/21 1435 10/29/21 1500  BP: (!) 114/48  130/65   Pulse: (!) 52  (!) 55   Resp: 18  17   Temp: 97.8 F (36.6 C)  98 F (36.7 C)   TempSrc: Axillary  Oral   SpO2: 100% 98% 100%   Weight:    76.7 kg  Height:         Intake/Output Summary (Last 24 hours) at 10/29/2021 1714 Last data filed at 10/29/2021 1200 Gross per 24 hour  Intake 840 ml  Output 150 ml  Net 690 ml   Last 3 Weights 10/29/2021 10/24/2021 10/16/2021  Weight (lbs) 169 lb 1.5 oz 167 lb 12.3 oz 273 lb 5.9 oz  Weight (kg) 76.7 kg 76.1 kg 124 kg     Body mass index is 27.29 kg/m.  General:  Chronically ill appearing, elderly female, comfortable HEENT: normal Neck: JVD to angle of the mandible Vascular: No carotid bruits; Distal pulses 2+ bilaterally Cardiac:  Irregular, no murmur Lungs:  Diminished at the bases Abd: soft, nontender, no hepatomegaly  Ext: 1+ pitting edema to the mid-shin Musculoskeletal:  Warm Neuro:  CNs 2-12 intact, no focal abnormalities noted Psych:  Normal affect   EKG:  The EKG was personally reviewed and demonstrates: Rate-controlled atrial fibrillation, HR 76 with low-voltage  Relevant CV Studies:  RHC: 10/2019 Findings:   RA = 19 RV = 84/19 PA = 92/28 (48) PCW = 22 Fick cardiac output/index = 7.7/3.1 Thermo CO/CI = 4.6/1.9 PVR = 5.5 WU (thermo) Ao sat =  99% PA sat = 65%, 67%   Assessment:   1. 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 Mildly elevated PCWP 3. Moderate to severely reduced CO by Thermo   Plan/Discussion:   Will start milrinone and IV lasix.    If no response may need HD. But with PAH unlikely to tolerate well. May need to get Palliative involved.   Echocardiogram: 10/2019 IMPRESSIONS     1. Severe RV dilation and severely reduced function consistent with cor  pulmonale. Septal flattening consistent with RV pressure overload present.  Estimated PASP ~ 66 mmHG, which is moderately elevated.   2. Left ventricular ejection fraction, by visual estimation, is 65 to  70%. The left ventricle has normal function. There is no left ventricular  hypertrophy.   3. Left ventricular diastolic function could not be evaluated.   4. Right ventricular pressure overload.   5.  Small left ventricular internal cavity size.   6. Global right ventricle has severely reduced systolic function.The  right ventricular size is severely enlarged. No increase in right  ventricular wall thickness.   7. Left atrial size was normal.   8. Right atrial size was severely dilated.   9. Presence of pericardial fat pad.  10. Mild mitral annular calcification.  11. The mitral valve is degenerative. No evidence of mitral valve  regurgitation.  12. The tricuspid valve is grossly normal. Tricuspid valve regurgitation  is mild.  13. The aortic valve is tricuspid. Aortic valve regurgitation is not  visualized. Mild aortic valve sclerosis without stenosis.  14. The pulmonic valve was grossly normal. Pulmonic valve regurgitation is  mild.  15. Moderately elevated pulmonary artery systolic pressure.  16. The tricuspid regurgitant velocity is 3.58 m/s, and with an assumed  right atrial pressure of 15 mmHg, the estimated right ventricular systolic  pressure is moderately elevated at 66.3 mmHg.  17. A venous catheter is visualized in the RA, RV and PA.  18. The inferior vena cava is dilated in size with <50% respiratory  variability, suggesting right atrial pressure of 15 mmHg.   Echocardiogram: 10/28/2021 IMPRESSIONS     1. Left ventricular ejection fraction, by estimation, is 60 to 65%. The  left ventricle has normal function. The left ventricle has no regional  wall motion abnormalities. Diastolic function indeterminant due to atrial  fibrillation.   2. Right ventricular systolic function is normal. The right ventricular  size is moderately enlarged. There is severely elevated pulmonary artery  systolic pressure. The estimated right ventricular systolic pressure is  73.7 mmHg.   3. Left atrial size was severely dilated.   4. Right atrial size was severely dilated.   5. The mitral valve is normal in structure. Trivial mitral valve  regurgitation.   6. Tricuspid valve  regurgitation is mild to moderate.   7. The aortic valve is tricuspid. There is mild calcification of the  aortic valve. There is mild thickening of the aortic valve. Aortic valve  regurgitation is trivial. Aortic valve sclerosis is present, with no  evidence of aortic valve stenosis.   8. The inferior vena cava is dilated in size with <50% respiratory  variability, suggesting right atrial pressure of 15 mmHg.   Comparison(s): Compared to prior TTE in 2020, there continues to be severe  pulmonary hypertension with PASP 9mHg on current study (previously  852mg).    Laboratory Data:  High Sensitivity Troponin:   Recent Labs  Lab 10/20/2021 1254 10/12/2021 1420  TROPONINIHS 23* 20*  Chemistry Recent Labs  Lab 10/25/2021 1254 10/23/2021 1420 10/28/21 0328 10/29/21 0517  NA 134*  --  136 133*  K 3.2*  --  4.1 4.2  CL 100  --  103 103  CO2 21*  --  22 19*  GLUCOSE 105*  --  86 98  BUN 22  --  23 25*  CREATININE 1.10*  --  1.06* 1.14*  CALCIUM 8.1*  --  8.1* 8.2*  MG  --  1.5* 1.6*  --   GFRNONAA 52*  --  54* 50*  ANIONGAP 13  --  11 11    Recent Labs  Lab 10/10/2021 1254 10/29/21 0517  PROT 4.7* 4.6*  ALBUMIN 2.3* 2.7*  AST 18 19  ALT 14 14  ALKPHOS 197* 154*  BILITOT 1.5* 1.4*   Lipids No results for input(s): CHOL, TRIG, HDL, LABVLDL, LDLCALC, CHOLHDL in the last 168 hours.  Hematology Recent Labs  Lab 10/18/2021 1254 10/28/21 0328 10/29/21 0701  WBC 10.9* 9.7 9.4  RBC 3.11* 2.76* 2.51*  HGB 10.1* 9.0* 8.2*  HCT 30.7* 27.5* 25.8*  MCV 98.7 99.6 102.8*  MCH 32.5 32.6 32.7  MCHC 32.9 32.7 31.8  RDW 16.0* 16.2* 16.8*  PLT 153 146* 130*   Thyroid No results for input(s): TSH, FREET4 in the last 168 hours.  BNP Recent Labs  Lab 10/21/2021 1254  BNP 495.0*    DDimer No results for input(s): DDIMER in the last 168 hours.   Radiology/Studies:  DG Abd 1 View  Result Date: 10/26/2021 CLINICAL DATA:  Vomiting this morning, history atrial fibrillation,  coronary artery disease, CHF, COPD, diabetes mellitus, hypertension EXAM: ABDOMEN - 1 VIEW COMPARISON:  Portable exam 1711 hours compared to 05/30/2020 FINDINGS: Nonobstructive bowel gas pattern. Increased stool in rectum. No bowel dilatation or bowel wall thickening. Osseous demineralization. Prior ORIF LEFT femur. IMPRESSION: Increased stool in rectum. Electronically Signed   By: Lavonia Dana M.D.   On: 10/29/2021 17:34   DG Chest Portable 1 View  Result Date: 10/26/2021 CLINICAL DATA:  Increasing shortness of breath beginning yesterday. EXAM: PORTABLE CHEST 1 VIEW COMPARISON:  10/25/2021 and older studies. FINDINGS: Cardiac silhouette is mildly enlarged. No mediastinal or hilar masses. Chronic thickening of the interstitial markings as well as chronic perihilar and lung base bronchitic changes. Opacity extends along the minor fissure and there is opacity at both lung bases consistent with a combination of small effusions and atelectasis. These findings are all similar to the prior study allowing for differences in patient positioning and technique. No pneumothorax. IMPRESSION: 1. No significant change from the recent prior study. Lung base opacities are noted consistent with a combination of small effusions and atelectasis. No convincing pneumonia or pulmonary edema. Electronically Signed   By: Lajean Manes M.D.   On: 10/12/2021 13:39     Assessment and Plan:   #End-Stage, Severe PAH with Cor Pulmonale: Likely multifactorial Group I, II, III. Followed by [name] with NYHA class IV symptoms. Patient bedbound with limited options. Followed by hospice at home. Not responding well to diuresis here, however, patient is not a candidate for advanced therapies. Likely needs to transition to more palliative measures if unresponsive to increased diuretic dosing. -Will do trial of lasix $Remove'60mg'[id number]$  IV BID -Sildenafil held due to hypotension -Options very limited as patient is not a candidate for advanced  therapies -Recommend palliative evaluation  #Acute on Chronic Diastolic HF with R>>L: Stage IV likely mainly due to severe PAH as detailed above. TTE here  with preserved EF, severe PHTN consistent with prior. Now with minimal response to diuresis with limited options. -Trial lasix 62m IV BID -Recommend palliative evaluation as patient not a candidate for advanced therapies  #Hypotension: Limiting diuresis.  -Continue midodrine  #Permanent Afib: Rate controlled. -Continue apixaban -Will fractionate dilt due to soft blood pressures -If needed, can start amiodarone and stop dilt  #CKD Stage IIIb: -Trend with diuresis  #Acute on Chronic Hypoxic Respiratory Failure: Multifactorial in the setting of volume overload, PHTN, OSA, COPD.  -Diuresis as above -Supplemental O2 as above -Limited options, recommend palliative evaluation   For questions or updates, please contact [facility] Please consult [email or web address] for contact info under    Signed, HFreada Bergeron [name]  10/29/2021 5:14 PM

## 2021-10-30 DIAGNOSIS — J9601 Acute respiratory failure with hypoxia: Secondary | ICD-10-CM | POA: Diagnosis not present

## 2021-10-30 DIAGNOSIS — I2781 Cor pulmonale (chronic): Secondary | ICD-10-CM | POA: Diagnosis not present

## 2021-10-30 DIAGNOSIS — I482 Chronic atrial fibrillation, unspecified: Secondary | ICD-10-CM | POA: Diagnosis not present

## 2021-10-30 DIAGNOSIS — Z7189 Other specified counseling: Secondary | ICD-10-CM

## 2021-10-30 DIAGNOSIS — L89152 Pressure ulcer of sacral region, stage 2: Secondary | ICD-10-CM

## 2021-10-30 DIAGNOSIS — Z515 Encounter for palliative care: Secondary | ICD-10-CM | POA: Diagnosis not present

## 2021-10-30 DIAGNOSIS — Z978 Presence of other specified devices: Secondary | ICD-10-CM | POA: Diagnosis not present

## 2021-10-30 DIAGNOSIS — R1110 Vomiting, unspecified: Secondary | ICD-10-CM

## 2021-10-30 DIAGNOSIS — I272 Pulmonary hypertension, unspecified: Secondary | ICD-10-CM | POA: Diagnosis not present

## 2021-10-30 LAB — CBC
HCT: 31.1 % — ABNORMAL LOW (ref 36.0–46.0)
Hemoglobin: 9.9 g/dL — ABNORMAL LOW (ref 12.0–15.0)
MCH: 32.4 pg (ref 26.0–34.0)
MCHC: 31.8 g/dL (ref 30.0–36.0)
MCV: 101.6 fL — ABNORMAL HIGH (ref 80.0–100.0)
Platelets: 125 10*3/uL — ABNORMAL LOW (ref 150–400)
RBC: 3.06 MIL/uL — ABNORMAL LOW (ref 3.87–5.11)
RDW: 17.7 % — ABNORMAL HIGH (ref 11.5–15.5)
WBC: 28.9 10*3/uL — ABNORMAL HIGH (ref 4.0–10.5)
nRBC: 1 % — ABNORMAL HIGH (ref 0.0–0.2)

## 2021-10-30 LAB — BLOOD GAS, ARTERIAL
Acid-base deficit: 6 mmol/L — ABNORMAL HIGH (ref 0.0–2.0)
Bicarbonate: 19.7 mmol/L — ABNORMAL LOW (ref 20.0–28.0)
Drawn by: 41977
FIO2: 28
O2 Saturation: 96.9 %
Patient temperature: 37
pCO2 arterial: 32.1 mmHg (ref 32.0–48.0)
pH, Arterial: 7.372 (ref 7.350–7.450)
pO2, Arterial: 92.8 mmHg (ref 83.0–108.0)

## 2021-10-30 LAB — COMPREHENSIVE METABOLIC PANEL
ALT: 11 U/L (ref 0–44)
AST: 14 U/L — ABNORMAL LOW (ref 15–41)
Albumin: 2.3 g/dL — ABNORMAL LOW (ref 3.5–5.0)
Alkaline Phosphatase: 152 U/L — ABNORMAL HIGH (ref 38–126)
Anion gap: 14 (ref 5–15)
BUN: 27 mg/dL — ABNORMAL HIGH (ref 8–23)
CO2: 17 mmol/L — ABNORMAL LOW (ref 22–32)
Calcium: 8.3 mg/dL — ABNORMAL LOW (ref 8.9–10.3)
Chloride: 105 mmol/L (ref 98–111)
Creatinine, Ser: 1.31 mg/dL — ABNORMAL HIGH (ref 0.44–1.00)
GFR, Estimated: 42 mL/min — ABNORMAL LOW (ref >60)
Glucose, Bld: 140 mg/dL — ABNORMAL HIGH (ref 70–99)
Potassium: 4.1 mmol/L (ref 3.5–5.1)
Sodium: 136 mmol/L (ref 135–145)
Total Bilirubin: 1.6 mg/dL — ABNORMAL HIGH (ref 0.3–1.2)
Total Protein: 4.1 g/dL — ABNORMAL LOW (ref 6.5–8.1)

## 2021-10-30 LAB — GLUCOSE, CAPILLARY
Glucose-Capillary: 140 mg/dL — ABNORMAL HIGH (ref 70–99)
Glucose-Capillary: 139 mg/dL — ABNORMAL HIGH (ref 70–99)

## 2021-10-30 LAB — MAGNESIUM: Magnesium: 2.8 mg/dL — ABNORMAL HIGH (ref 1.7–2.4)

## 2021-10-30 MED ORDER — POLYVINYL ALCOHOL 1.4 % OP SOLN: 1.0000 [drp] | Freq: Four times a day (QID) | OPHTHALMIC | Status: DC | PRN

## 2021-10-30 MED ORDER — BIOTENE DRY MOUTH MT LIQD: 15.0000 mL | OROMUCOSAL | Status: DC | PRN

## 2021-10-30 MED ORDER — GERHARDT'S BUTT CREAM
TOPICAL_CREAM | Freq: Four times a day (QID) | CUTANEOUS | Status: DC | PRN
  Filled 2021-10-30: qty 1

## 2021-10-30 MED ORDER — GLYCOPYRROLATE 0.2 MG/ML IJ SOLN
0.4000 mg | INTRAMUSCULAR | Status: DC
  Administered 2021-10-30 (×2): 0.4 mg via INTRAVENOUS
  Filled 2021-10-30 (×2): qty 2

## 2021-10-30 MED ORDER — ONDANSETRON HCL 4 MG/2ML IJ SOLN: 4.0000 mg | Freq: Four times a day (QID) | INTRAMUSCULAR | Status: DC | PRN

## 2021-10-30 MED ORDER — LACTATED RINGERS IV BOLUS
250.0000 mL | Freq: Once | INTRAVENOUS | Status: AC
  Administered 2021-10-30: 250 mL via INTRAVENOUS

## 2021-10-30 MED ORDER — ONDANSETRON 4 MG PO TBDP: 4.0000 mg | ORAL_TABLET | Freq: Four times a day (QID) | ORAL | Status: DC | PRN

## 2021-10-30 MED ORDER — MORPHINE SULFATE (PF) 2 MG/ML IV SOLN
1.0000 mg | INTRAVENOUS | Status: DC | PRN
Start: 2021-10-30 — End: 2021-10-31
  Administered 2021-10-30: 2 mg via INTRAVENOUS
  Administered 2021-10-30: 1 mg via INTRAVENOUS
  Administered 2021-10-31: 2 mg via INTRAVENOUS
  Filled 2021-10-30 (×3): qty 1

## 2021-10-30 MED ORDER — HALOPERIDOL LACTATE 5 MG/ML IJ SOLN: 2.0000 mg | Freq: Four times a day (QID) | INTRAMUSCULAR | Status: DC | PRN

## 2021-10-30 MED ORDER — NYSTATIN 100000 UNIT/GM EX POWD: Freq: Three times a day (TID) | CUTANEOUS | Status: DC | PRN

## 2021-10-30 MED ORDER — LORAZEPAM 2 MG/ML IJ SOLN: 1.0000 mg | INTRAMUSCULAR | Status: DC | PRN

## 2021-10-30 MED ORDER — FUROSEMIDE 10 MG/ML IJ SOLN
20.0000 mg | Freq: Two times a day (BID) | INTRAMUSCULAR | Status: DC
Start: 2021-10-30 — End: 2021-10-30
  Administered 2021-10-30: 20 mg via INTRAVENOUS
  Filled 2021-10-30: qty 2

## 2021-10-30 MED ORDER — CHLORHEXIDINE GLUCONATE CLOTH 2 % EX PADS
6.0000 | MEDICATED_PAD | Freq: Every day | CUTANEOUS | Status: DC
  Administered 2021-10-30: 6 via TOPICAL

## 2021-10-30 NOTE — Progress Notes (Signed)
   10/30/21 1200  Vitals  Temp (!) 97.4 F (36.3 C)  Temp Source Axillary  BP (!) 75/36  MAP (mmHg) (!) 48  BP Location Right Arm  BP Method Automatic  Patient Position (if appropriate) Lying  Pulse Rate 82  Pulse Rate Source Dinamap  Resp (!) 28  Level of Consciousness  Level of Consciousness Responds to Voice  MEWS COLOR  MEWS Score Color Red  Oxygen Therapy  SpO2 (!) 89 %  O2 Device Nasal Cannula  O2 Flow Rate (L/min) 4 L/min  Pain Assessment  Pain Scale Faces  Faces Pain Scale 0  MEWS Score  MEWS Temp 0  MEWS Systolic 2  MEWS Pulse 0  MEWS RR 2  MEWS LOC 1  MEWS Score 5    MD notified via secure message.

## 2021-10-30 NOTE — Progress Notes (Signed)
Pt comfort care measures only. Family at bedside at this time. Pt medicated with prn IV Morphine 1 mg. Bed in lowest position. Call bell in reach.

## 2021-10-30 NOTE — Progress Notes (Incomplete)
Discussed with Dr. Cyd Silence patient's low bp and have kept him updated on bp after first ordered bolus of 250 ml LR at 250/hr.  BP improved somewhat with no increase in congestion or work of breathing so another bolus was ordered of LR 250 mls at 500/hr.  Patient has been somewhat lethagic with shallow breathing this shift. Charge nurse called and left message with daughter on  patient's condition.

## 2021-10-30 NOTE — Progress Notes (Signed)
Lethargic and unable to give synthroid and cardizem.

## 2021-10-30 NOTE — Progress Notes (Signed)
   10/30/21 0717  Assess: MEWS Score  Temp 97.6 F (36.4 C)  BP 91/81  Pulse Rate 69  Resp (!) 24  SpO2 96 %  O2 Device Nasal Cannula  O2 Flow Rate (L/min) 2 L/min  Assess: MEWS Score  MEWS Temp 0  MEWS Systolic 1  MEWS Pulse 0  MEWS RR 1  MEWS LOC 0  MEWS Score 2  MEWS Score Color Yellow  Assess: if the MEWS score is Yellow or Red  Were vital signs taken at a resting state? No  Focused Assessment Change from prior assessment (see assessment flowsheet)  Early Detection of Sepsis Score *See Row Information* Low  Take Vital Signs  Increase Vital Sign Frequency  Yellow: Q 2hr X 2 then Q 4hr X 2, if remains yellow, continue Q 4hrs  Escalate  MEWS: Escalate Yellow: discuss with charge nurse/RN and consider discussing with provider and RRT  Notify: Charge Nurse/RN  Name of Charge Nurse/RN Notified Lawernce Keas, RN  Date Charge Nurse/RN Notified 10/30/21  Time Charge Nurse/RN Notified 0720  Notify: Provider  Provider Name/Title G. Shalhoub, MD  Date Provider Notified 10/30/21  Time Provider Notified [phone]  Notification Type Face-to-face  Provider response No new orders  Date of Provider Response 10/30/21  Document  Patient Outcome Stabilized after interventions  Progress note created (see row info) Yes

## 2021-10-30 NOTE — Progress Notes (Signed)
   Progress Note  Patient Name: [name] Date of Encounter: 10/30/2021  Primary Cardiologist: Minus Breeding, MD Advanced Heart Failure Cardiologist: Pierre Bali, MD  Chart reviewed including cardiology consultation by Dr. Johney Frame yesterday and interval hospital course.  Ms. Pagel did not tolerate IV Lasix with subsequent hypotension limiting medications.  She was seen by hospitalist overnight with 250 cc fluid bolus.  She is lethargic, remains in atrial fibrillation with heart rate in the 90's and has continued hypotension with systolics of 16X to 09U.  Currently on midodrine 10 mg 3 times daily, Lasix being discontinued.  Not able to tolerate sildenafil either and this was already discontinued.  Echocardiogram shows LVEF 60 to 65% with moderate RV dilatation and severe elevation in RVSP estimated at 66 mmHg.  Chart reviewed, does not appear that she has had regular follow-up with Dr. Haroldine Laws, patient canceled virtual visit in October per note.  She has been homebound and with hospice care most recently and has end-stage PAH with cor pulmonale, likely combination of WHO group 1, 2, and 3, very poor prognosis.  She is not tolerating standard treatments and is not a good candidate for advanced therapies at this point.  Currently DNR and with recent hospice, would consider comfort measures in light of clinical decline.   Signed, Rozann Lesches, MD  10/30/2021, 10:49 AM

## 2021-10-30 NOTE — Progress Notes (Signed)
   10/30/21 0502  Assess: MEWS Score  Temp (!) 97.5 F (36.4 C)  BP (!) 50/38  Pulse Rate 95  Resp 20  SpO2 99 %  O2 Device Nasal Cannula  O2 Flow Rate (L/min) 2 L/min  Assess: MEWS Score  MEWS Temp 0  MEWS Systolic 3  MEWS Pulse 0  MEWS RR 0  MEWS LOC 0  MEWS Score 3  MEWS Score Color Yellow  Assess: if the MEWS score is Yellow or Red  Were vital signs taken at a resting state? Yes  Focused Assessment Change from prior assessment (see assessment flowsheet)  Early Detection of Sepsis Score *See Row Information* Low  Take Vital Signs  Increase Vital Sign Frequency  Yellow: Q 2hr X 2 then Q 4hr X 2, if remains yellow, continue Q 4hrs  Escalate  MEWS: Escalate Yellow: discuss with charge nurse/RN and consider discussing with provider and RRT  Notify: Charge Nurse/RN  Name of Charge Nurse/RN Notified Lawernce Keas, RN  Date Charge Nurse/RN Notified 10/30/21  Time Charge Nurse/RN Notified 0505  Notify: Provider  Provider Name/Title G. Shalhoub, MD  Date Provider Notified 10/30/21  Time Provider Notified 0510  Notification Type Page  Notification Reason Change in status  Provider response See new orders (ordered bolus, dc'd cardizem and reduced lasix dose)  Date of Provider Response 10/30/21  Time of Provider Response [phone]  Document  Patient Outcome Not stable and remains on department  Progress note created (see row info) Yes

## 2021-10-30 NOTE — Progress Notes (Signed)
Palliative:  Full note to follow. Family at bedside. Full comfort care at end of life. They do not want her to suffer. Chaplain notified to offer additional support.   Vinie Sill, NP Palliative Medicine Team Pager [phone] (Please see [email or web address] for schedule) Team Phone [phone]

## 2021-10-30 NOTE — Consult Note (Signed)
Consultation Note Date: 10/30/2021   Patient Name: [name]  DOB: 08-26-1946  MRN: [id number]  Age / Sex: 75 y.o., female  PCP: Claretta Fraise, MD Referring Physician: Barton Dubois, MD  Reason for Consultation: Terminal Care  HPI/Patient Profile: 75 y.o. female  with past medical history of atrial fibrillation on anticoagulation, cor pulmonale, end stage pulmonary hypertension, CAD, COPD, sleep apnea, diabetes, bedbound, multiple admissions to outside hospital over the past month admitted on 10/07/2021 with difficulty breathing related to pulmonary hypertension and cor pulmonale. She has declined over course of hospitalization to end of life.   Clinical Assessment and Goals of Care: Received consult from Dr. Dyann Kief and report from him and bedside RN. Ms. Lecompte' is lying in bed with severe hypotension but appears comfortable but at end of life. I had multiple failed attempts to reach family but was able to reach them using alternate phone numbers I located in [place] records. I spoke first with granddaughter Jerene Pitch and then with daughter [name]. I explained to them both that Ms. Wiens is declining and she is unfortunately at the end of her life and I encouraged them to coem to bedside as soon as possible if desired.   Update: I met today with family at bedside. They are appropriately tearful and I explained that her pulmonary hypertension has progressed and her body is failing. We are unable to even give her baseline pulmonary hypertension medications due to her critically low blood pressure. At this stage there is nothing we can do to improve her condition. I spoke with them about having medication available to provide her relief and comfort as needed. They agree that they do not want her to suffer. Other family are en-route.   All questions/concerns addressed. Emotional support provided.    Primary Decision Maker NEXT OF KIN daughter [name]    SUMMARY OF RECOMMENDATIONS   - DNR already in place - Full comfort care at end of life - Anticipate hospital death  Code Status/Advance Care Planning: Full code   Symptom Management:  Comfort medications available PRN. Orders changed to reflect full comfort care.   Palliative Prophylaxis:  Frequent Pain Assessment and Oral Care  Additional Recommendations (Limitations, Scope, Preferences): Full Comfort Care  Psycho-social/Spiritual:  Desire for further Chaplaincy support:yes  Prognosis:  Hours - Days  Discharge Planning: Anticipated Hospital Death      Primary Diagnoses: Present on Admission:  Hypothyroidism  Moderate to severe pulmonary hypertension (HCC)  Severe obesity (BMI >= 40) (HCC)  Atrial fibrillation, chronic (HCC)  Acute respiratory failure with hypoxia (HCC)  Sacral decubitus ulcer  Cor pulmonale ([place])   I have reviewed the medical record, interviewed the patient and family, and examined the patient. The following aspects are pertinent.  Past Medical History:  Diagnosis Date   Acute on chronic diastolic (congestive) heart failure ([place]) 10/12/2019   Anemia    Arthritis    Asthma    Atrial fibrillation (HCC)    CAD (coronary artery disease)    LAD  60% proximal stenosis mid and distal 60% stenosis 2009.   Cataract    had surgery   CHF (congestive heart failure) (HCC)    COPD (chronic obstructive pulmonary disease) (HCC)    Diabetes mellitus    Gait instability    uses cane   GERD (gastroesophageal reflux disease)    Hyperlipidemia    Hypertension    Obesity    Oxygen dependent    2 liter per nasal cannula   Sleep apnea, obstructive 2008   Stroke [facility])    2 mini   Social History   Socioeconomic History   Marital status: Widowed    Spouse name: Not on file   Number of children: 1   Years of education: 7th   Highest education level: 7th grade  Occupational History    Occupation: Disabled   Occupation: Retired    Comment: Herbalist  Tobacco Use   Smoking status: Never   Smokeless tobacco: Never  Vaping Use   Vaping Use: Never used  Substance and Sexual Activity   Alcohol use: No   Drug use: No   Sexual activity: Not Currently  Other Topics Concern   Not on file  Social History Narrative   She is bedridden -lives with her granddaughter and her husband   Social Determinants of Radio broadcast assistant Strain: Not on file  Food Insecurity: Not on file  Transportation Needs: Not on file  Physical Activity: Not on file  Stress: No Stress Concern Present   Feeling of Stress : Not at all  Social Connections: Not on file   Family History  Problem Relation Age of Onset   Colon cancer Mother    Heart attack Mother    Alzheimer's disease Mother        father   Diabetes Mother    Heart disease Mother    Cancer Mother        colon   Kidney disease Mother    Cancer Sister        brain and spine   Migraines Sister    Alzheimer's disease Father    Cancer Brother        stomach   Diabetes Sister    Cancer Sister        breast   Heart disease Sister    Heart attack Sister    Breast cancer Sister    Heart disease Brother    Heart murmur Brother    Diabetes Other    Heart attack Other    Diabetes Daughter    Asthma Daughter    Asthma Grandchild    Scheduled Meds:  apixaban  5 mg Oral BID   Chlorhexidine Gluconate Cloth  6 each Topical Daily   doxycycline  100 mg Oral BID   furosemide  20 mg Intravenous BID   Gerhardt's butt cream   Topical QID   insulin aspart  0-5 Units Subcutaneous QHS   insulin aspart  0-9 Units Subcutaneous TID WC   levothyroxine  50 mcg Oral Daily   midodrine  10 mg Oral TID   nystatin   Topical TID   polyethylene glycol  17 g Oral BID   rivastigmine  4.6 mg Transdermal Daily   sodium chloride flush  10-40 mL Intracatheter Q12H   Continuous Infusions: PRN Meds:.acetaminophen **OR**  acetaminophen, ipratropium-albuterol, promethazine, sodium chloride flush Allergies  Allergen Reactions   Lisinopril     R/t to kidney   Codeine Nausea And Vomiting  Review of Systems  Unable to perform ROS: Acuity of condition   Physical Exam Vitals and nursing note reviewed.  Constitutional:      General: She is sleeping. She is not in acute distress.    Appearance: She is ill-appearing.  Cardiovascular:     Rate and Rhythm: Normal rate.  Pulmonary:     Comments: Shallow but regular Extremities cold to touch Abdominal:     Palpations: Abdomen is soft.  Neurological:     Comments: Minimally responsive. Attempts to speak when family gathered at bedside but really only able to moan.     Vital Signs: BP (!) 62/42 (BP Location: Right Arm)   Pulse 97   Temp 98.8 F (37.1 C) (Oral)   Resp (!) 24   Ht _0  (1.676 m)   Wt 76.7 kg   SpO2 100%   BMI 27.29 kg/m  Pain Scale: Faces   Pain Score: 0-No pain   SpO2: SpO2: 100 % O2 Device:SpO2: 100 % O2 Flow Rate: .O2 Flow Rate (L/min): 2 L/min  IO: Intake/output summary:  Intake/Output Summary (Last 24 hours) at 10/30/2021 1115 Last data filed at 10/30/2021 0913 Gross per 24 hour  Intake 330 ml  Output 450 ml  Net -120 ml    LBM: Last BM Date: 10/30/21 Baseline Weight: Weight: 124 kg Most recent weight: Weight: 76.7 kg     Palliative Assessment/Data:    Time Total: 60 min  Greater than 50%  of this time was spent counseling and coordinating care related to the above assessment and plan.  Signed by: Vinie Sill, NP Palliative Medicine Team Pager # [phone] (M-F 8a-5p) Team Phone # [phone] (Nights/Weekends)

## 2021-10-30 NOTE — Progress Notes (Addendum)
HOSPITAL MEDICINE OVERNIGHT EVENT NOTE    Notified by nursing that patient is currently exhibiting extremely low blood pressures of 50/40 with a pulse of 95.  Patient is lethargic but arousable.  Chart reviewed, patient is suffering from acute cardiogenic volume overload from what seems to be right heart failure, severe pulmonary hypertension with cor pulmonale.  Patient is being followed by cardiology and is already on midodrine 3 times daily for low blood pressures.  Patient last received a dose of 60 mg of intravenous Lasix yesterday evening at 6:19 PM and also received 30 mg of diltiazem shortly after midnight.  Nursing reports that patient is quite lethargic and is unsafe to take p.o. intake.  We will administer 250 cc bolus of lactated Ringer solution and reassess.  Based on my review of the patient's chart, it is apparent the patient possesses an extremely poor prognosis.  We will monitor closely, notify family if patient fails to clinically improve with bolus.  Wendy Emerald  MD [facility]   ADDENDUM 5:45GY  Systolic blood pressures up into the 70s after one 250 cc bolus.  Repeating the bolus and will reassess.  [name]  ADDENDUM 5:63SL  Systolic blood pressures now into the 90s which is approximately her baseline.  I went to evaluate the patient at the bedside who is lethargic but arousable.  Some mild bibasilar rales on exam the patient is not in respiratory distress and not hypoxic.  Majority of patient's volume overload is peripheral as this is right heart failure.  Overall I feel that the patient's prognosis is extremely poor and warrants a repeat goals of care discussion with daughter with palliative care involvement.  Will need to be addressed on day shift unfortunately as daughter did not answer.  [name]

## 2021-10-30 NOTE — Progress Notes (Signed)
   10/30/21 0900  Assess: MEWS Score  Temp 98.8 F (37.1 C)  BP (!) 62/42  Pulse Rate 97  SpO2 100 %  O2 Device Nasal Cannula  O2 Flow Rate (L/min) 2 L/min  Assess: MEWS Score  MEWS Temp 0  MEWS Systolic 3  MEWS Pulse 0  MEWS RR 1  MEWS LOC 0  MEWS Score 4  MEWS Score Color Red  Assess: if the MEWS score is Yellow or Red  Were vital signs taken at a resting state? Yes  Focused Assessment No change from prior assessment  Early Detection of Sepsis Score *See Row Information* Low  MEWS guidelines implemented *See Row Information* Yes (MD attempting to speak to family about comfort care, palliative consulted)  Treat  MEWS Interventions Escalated (See documentation below)  Pain Scale PAINAD  Pain Score 0  Faces Pain Scale 0  Take Vital Signs  Increase Vital Sign Frequency  Red: Q 1hr X 4 then Q 4hr X 4, if remains red, continue Q 4hrs  Escalate  MEWS: Escalate Red: discuss with charge nurse/RN and provider, consider discussing with RRT  Notify: Charge Nurse/RN  Name of Charge Nurse/RN Notified Ardelia Mems RN  Date Charge Nurse/RN Notified 10/30/21  Time Charge Nurse/RN Notified 1053  Notify: Provider  Provider Name/Title Dr. Dyann Kief  Date Provider Notified 10/30/21  Time Provider Notified 1053  Notification Type Page  Notification Reason Critical result

## 2021-10-30 NOTE — Progress Notes (Signed)
PROGRESS NOTE    [name]  IHK:[id number] DOB: 1946-02-14 DOA: 10/07/2021 PCP: Claretta Fraise, MD    Brief Narrative:  75 year old female with a history of atrial fibrillation, cor pulmonale, severe pulmonary hypertension, COPD, diabetes, who lives at home with her daughter and has been bedbound.  She has had multiple admissions to the hospital, primarily at [facility] for variety of issues.  She was recently discharged from the hospital approximately 2 days ago and records indicate that she was treated for volume overload and rapid atrial fibrillation.  She presents to [facility] with shortness of breath and by what daughter describes as worsening peripheral edema.  She is also had vomiting and decreased bowel movements for 2 days prior to admission.  She noted to be hypoxic on room air.  Chest x-ray showed some evidence of volume overload.   Assessment & Plan:   Principal Problem:   Acute respiratory failure with hypoxia (HCC) Active Problems:   Severe obesity (BMI >= 40) (HCC)   Atrial fibrillation, chronic (HCC)   Hypothyroidism   Current use of long term anticoagulation   Lymphedema of both lower extremities   Moderate to severe pulmonary hypertension (HCC)   Chronic indwelling Foley catheter   Sacral decubitus ulcer   Cor pulmonale (HCC)   Acute on chronic respiratory failure with hypoxia -I suspect this is related to volume overload from CHF -She was on oxygen at 2 L since 2012, recently taken off oxygen last few weeks to months. -currently on O2 supplementation for comfort management.  -family interested to go home with hospice if patient stabilizes -prognosis is very poor. -hospital death anticipated.  Acute on chronic right-sided heart failure/cor pulmonale -Last echo from 2020 showed pulmonary pressures in the 80s and severe RV dysfunction -She was hospitalized at that time for volume overload, requiring pressors and milrinone as well as Lasix  drip -Daughter reports that she has been taking torsemide 20 mg p.o. 3 times daily prior to admission. -poor response to IV lasix -BP has bottom down and patient essentially has become unresponsive. -cardiology recommending palliative care and comfort -terminal/end stage right side heart failure and pulmonary HTN -not candidate for invasive intervention.  Hypotension -Daughter reports that pressure chronically runs low, she was on 3 times daily midodrine; unable to take PO currently -will focus on comfort care only. -sildenafil and diltiazem discontinue -patient received albumin infusion, small IV boluses and prior to become high risk for aspiration was also on her midodrine. -now focusing on comfort care -SBP in the 50's to 60's; not looking for invasive interventions and focusing on comfort only.  Vomiting -Abdominal x-ray showed increased stool in rectum -Patient reports no bowel movement in several days -continue antiemetics.  Atrial fibrillation, permanent -Chronically on diltiazem prior to admission -Unable to take oral medications and with significant low blood pressure -Diltiazem has been discontinued -No anticoagulation will be provided at this time. -Focusing on comfort care only.  Chronic kidney disease stage IIIb -Creatinine last check at baseline -No further blood work anticipated -Focusing on comfort care only.  Chronic anemia -No reported recent bleeding -Possible related to underlying renal disease -No anticipated further blood work; full comfort care and symptomatic management mode.  Obstructive sleep apnea COPD -Daughter reports that patient has not been using CPAP for several years now.   -Continue oxygen supplementation and comfort care.  Sacral decubitus ulcers, candidal intertrigo -WOC consult, appreciate input and recommendations. -Focusing on comfort care currently.  Chronic indwelling Foley catheter -Appears  to have in place since patient is  bedbound and concern for skin breakdown due to urinary incontinence -Continue for now, as part of comfort care management.  Questionable bacteremia -Recent hospitalization at [facility] with 2 positive blood cultures for coagulase-negative staph pettenkoferi, which may have been contaminants -She was discharged with doxycycline for a total of 14 days which should be completed on 12/9 -Blood cultures repeated on admission and so far without growth. -Now that the patient is transition to full code for antibiotic has been discontinued.  Hypokalemia/hypomagnesemia -Electrolytes have been repleted; no further blood work anticipated at this time. -Plan is for full comfort care.  Diabetes -A1c 6.4 -Holding oral hypoglycemic agents; after transition of care to comfort we will also stop CBGs and sliding scale insulin.  Hypothyroidism -Currently unable to take oral medications due to increased somnolence; after discussion to transition to comfort care any medications not intended to provide symptomatic management will be discontinued.  Goals of care -Patient has significant chronic illnesses -Follow-up care with her primary physicians/cardiologist has been difficult since she is bedbound. -Review of records indicate that majority of visits with providers have been virtually performed prior to admission. -Per daughter, patient was followed by hospice, in the past; but was no longer actively follow prior to admission. -Overall poor prognosis and not a candidate for invasive intervention. -She remains DNR/DNI -After discussion with palliative care plan has been decided to proceed with symptomatic management and comfort care only.  Medication has been adjusted and comfort care measures ordered. [facility] death anticipated.  DVT prophylaxis:   Code Status: DNR Family Communication: Patient daughter/granddaughter unable to be reached over the phone; our palliative care service was able to  discuss an updated as part of plan of care and further disposition. Disposition Plan: Status is: Inpatient  The patient will require care spanning > 2 midnights and should be moved to inpatient because: Patient continue demonstrating failure to IV diuresis and after discussion with family and palliative care intervention decision has been made to pursuit symptomatic management and comfort care only.  Overall poor prognosis and hospital death anticipated.  Consultants:  Cardiology Palliative care  Procedures:  See below for x-ray reports  Antimicrobials:  Doxycycline, continued from outpatient course    Subjective: Minimally responsive today; increased tachypnea, ongoing hypotension and decreased urine output with associated hypoxia requiring oxygen supplementation.  Objective: Vitals:   10/30/21 1100 10/30/21 1200 10/30/21 1233 10/30/21 1702  BP: (!) 61/35 (!) 75/36  (!) 65/42  Pulse: 99 82  (!) 110  Resp:  (!) 28  (!) 26  Temp: (!) 97.3 F (36.3 C) (!) 97.4 F (36.3 C)  (!) 97.2 F (36.2 C)  TempSrc: Axillary Axillary  Axillary  SpO2: 90% (!) 89% (!) 89% 100%  Weight:      Height:        Intake/Output Summary (Last 24 hours) at 10/30/2021 1718 Last data filed at 10/30/2021 1142 Gross per 24 hour  Intake 210 ml  Output 250 ml  Net -40 ml   Filed Weights   10/30/2021 1224 10/04/2021 1654 10/29/21 1500  Weight: 124 kg 76.1 kg 76.7 kg    Examination: General exam: Patient currently significantly somnolent, not following commands and essentially unable to answer questions.  Mild agonal breathing appreciated.  Decreased urine output and ongoing hypotension appreciated. Respiratory system: Agonal breathing, oxygen supplementation in place; positive tachypnea. Cardiovascular system: No rubs, no gallops, no JVD. Gastrointestinal system: Abdomen is nondistended, soft and nontender. No organomegaly or  masses felt. Normal bowel sounds heard. Central nervous system: Unable to  properly assess; patient not following commands.  Minimally responsive. Extremities: No cyanosis or clubbing; trace to 1+ edema appreciated bilaterally (affecting upper and lower extremities).. Skin: No petechiae.  Stage II sacral/buttocks pressure injury with associated busted blisters present at time of admission.  No signs of superimposed infection. Psychiatry: Currently with impaired judgment and insight; flat affect.   Data Reviewed: I have personally reviewed following labs and imaging studies  CBC: Recent Labs  Lab 10/30/2021 1254 10/28/21 0328 10/29/21 0701 10/30/21 0641  WBC 10.9* 9.7 9.4 28.9*  NEUTROABS 9.2*  --   --   --   HGB 10.1* 9.0* 8.2* 9.9*  HCT 30.7* 27.5* 25.8* 31.1*  MCV 98.7 99.6 102.8* 101.6*  PLT 153 146* 130* 888*   Basic Metabolic Panel: Recent Labs  Lab 10/15/2021 1254 10/16/2021 1420 10/28/21 0328 10/29/21 0517 10/30/21 0444  NA 134*  --  136 133* 136  K 3.2*  --  4.1 4.2 4.1  CL 100  --  103 103 105  CO2 21*  --  22 19* 17*  GLUCOSE 105*  --  86 98 140*  BUN 22  --  23 25* 27*  CREATININE 1.10*  --  1.06* 1.14* 1.31*  CALCIUM 8.1*  --  8.1* 8.2* 8.3*  MG  --  1.5* 1.6*  --  2.8*   GFR: Estimated Creatinine Clearance: 37.6 mL/min (A) (by C-G formula based on SCr of 1.31 mg/dL (H)).  Liver Function Tests: Recent Labs  Lab 10/06/2021 1254 10/29/21 0517 10/30/21 0444  AST 18 19 14*  ALT 14 14 11   ALKPHOS 197* 154* 152*  BILITOT 1.5* 1.4* 1.6*  PROT 4.7* 4.6* 4.1*  ALBUMIN 2.3* 2.7* 2.3*   HbA1C: Recent Labs    10/28/21 0328  HGBA1C 6.4*   CBG: Recent Labs  Lab 10/29/21 1128 10/29/21 1704 10/29/21 2117 10/30/21 0728 10/30/21 1107  GLUCAP 130* 84 140* 140* 139*    Sepsis Labs: Recent Labs  Lab 10/26/2021 1254 10/20/2021 1420  LATICACIDVEN 1.5 1.6    Recent Results (from the past 240 hour(s))  Culture, blood (routine x 2)     Status: None (Preliminary result)   Collection Time: 10/24/2021 12:54 PM   Specimen: Right  Antecubital; Blood  Result Value Ref Range Status   Specimen Description   Final    RIGHT ANTECUBITAL BOTTLES DRAWN AEROBIC AND ANAEROBIC   Special Requests Blood Culture adequate volume  Final   Culture   Final    NO GROWTH 3 DAYS Performed at [facility], [address], [place], [place] [address]    Report Status PENDING  Incomplete  Culture, blood (routine x 2)     Status: None (Preliminary result)   Collection Time: 10/29/2021  1:17 PM   Specimen: BLOOD LEFT HAND  Result Value Ref Range Status   Specimen Description   Final    BLOOD LEFT HAND BOTTLES DRAWN AEROBIC AND ANAEROBIC   Special Requests   Final    Blood Culture results may not be optimal due to an inadequate volume of blood received in culture bottles   Culture   Final    NO GROWTH 3 DAYS Performed at [facility], [address], [place],  [address]    Report Status PENDING  Incomplete  Resp Panel by RT-PCR (Flu A&B, Covid) Nasopharyngeal Swab     Status: None   Collection Time: 10/28/2021  2:21 PM   Specimen:  Nasopharyngeal Swab; Nasopharyngeal(NP) swabs in vial transport medium  Result Value Ref Range Status   SARS Coronavirus 2 by RT PCR NEGATIVE NEGATIVE Final    Comment: (NOTE) SARS-CoV-2 target nucleic acids are NOT DETECTED.  The SARS-CoV-2 RNA is generally detectable in upper respiratory specimens during the acute phase of infection. The lowest concentration of SARS-CoV-2 viral copies this assay can detect is 138 copies/mL. A negative result does not preclude SARS-Cov-2 infection and should not be used as the sole basis for treatment or other patient management decisions. A negative result may occur with  improper specimen collection/handling, submission of specimen other than nasopharyngeal swab, presence of viral mutation(s) within the areas targeted by this assay, and inadequate number of viral copies(<138 copies/mL). A negative result must be combined with clinical observations, patient history,  and epidemiological information. The expected result is Negative.  Fact Sheet for Patients:  EntrepreneurPulse.com.au  Fact Sheet for Healthcare Providers:  IncredibleEmployment.be  This test is no t yet approved or cleared by the [place] FDA and  has been authorized for detection and/or diagnosis of SARS-CoV-2 by FDA under an Emergency Use Authorization (EUA). This EUA will remain  in effect (meaning this test can be used) for the duration of the COVID-19 declaration under Section 564(b)(1) of the Act, 21 U.S.C.section 360bbb-3(b)(1), unless the authorization is terminated  or revoked sooner.       Influenza A by PCR NEGATIVE NEGATIVE Final   Influenza B by PCR NEGATIVE NEGATIVE Final    Comment: (NOTE) The Xpert Xpress SARS-CoV-2/FLU/RSV plus assay is intended as an aid in the diagnosis of influenza from Nasopharyngeal swab specimens and should not be used as a sole basis for treatment. Nasal washings and aspirates are unacceptable for Xpert Xpress SARS-CoV-2/FLU/RSV testing.  Fact Sheet for Patients: EntrepreneurPulse.com.au  Fact Sheet for Healthcare Providers: IncredibleEmployment.be  This test is not yet approved or cleared by the [place] FDA and has been authorized for detection and/or diagnosis of SARS-CoV-2 by FDA under an Emergency Use Authorization (EUA). This EUA will remain in effect (meaning this test can be used) for the duration of the COVID-19 declaration under Section 564(b)(1) of the Act, 21 U.S.C. section 360bbb-3(b)(1), unless the authorization is terminated or revoked.  Performed at [facility], [address], [place], [place] [address]      Radiology Studies: No results found.   Scheduled Meds:  Chlorhexidine Gluconate Cloth  6 each Topical Daily   glycopyrrolate  0.4 mg Intravenous Q4H   sodium chloride flush  10-40 mL Intracatheter Q12H   Continuous  Infusions:     LOS: 1 day    Time spent: 35 mins    Barton Dubois, MD [facility]   If 7PM-7AM, please contact night-coverage [email or web address]  10/30/2021, 5:18 PM

## 2021-10-30 NOTE — Progress Notes (Signed)
Called daughter to notify her of patient condition.  No answer received at this time. Left message for return phone call.

## 2021-10-31 DIAGNOSIS — I890 Lymphedema, not elsewhere classified: Secondary | ICD-10-CM

## 2021-10-31 DIAGNOSIS — J449 Chronic obstructive pulmonary disease, unspecified: Secondary | ICD-10-CM

## 2021-10-31 DIAGNOSIS — E039 Hypothyroidism, unspecified: Secondary | ICD-10-CM

## 2021-10-31 DEATH — deceased

## 2021-11-01 LAB — CULTURE, BLOOD (ROUTINE X 2)
Culture: NO GROWTH
Culture: NO GROWTH
Special Requests: ADEQUATE

## 2021-11-21 DIAGNOSIS — R2689 Other abnormalities of gait and mobility: Secondary | ICD-10-CM | POA: Diagnosis not present

## 2021-11-21 DIAGNOSIS — M6281 Muscle weakness (generalized): Secondary | ICD-10-CM | POA: Diagnosis not present

## 2021-12-01 NOTE — Progress Notes (Signed)
   09-Nov-2021 0615  Attending [place]  Attending Physician Notified Y  Attending Physician (First and Last Name) Lindell Noe, DO  Post Mortem Checklist  Date of Death Nov 09, 2021  Time of Death 0615  Pronounced By Reather Converse RN and Grace Isaac RN  Next of kin notified Yes  Name of next of kin notified of death 0615  Contact Person's Relationship to Patient Daughter  Contact Person's Phone Number [phone]  Contact Person's address [place], [place] [address]  Was the patient a No Code Blue or a Limited Code Blue? Yes  Did the patient die unattended? No  Patient restrained? Not applicable  Height 5\' 6"  (1.676 m)  Weight 76.7 kg  Body preparation complete N (per family request)  [employer] (previously known as [employer])  Notification Date 11/09/2021  Notification Time 0644  [employer] Number [phone] Bethena Roys)  Is patient a potential donor? N  Autopsy  Autopsy requested by N/A  Patient and [place] Returned  Patient is satisfied that all belongings have been returned? Not applicable  Dermatherapy linen/gowns NOT sent with patient or transporter Not applicable  Dead on Arrival (Emergency Department)  Patient dead on arrival? No  Medical Examiner  Is this a medical examiner's case? [facility] home name/address/phone # [place] [phone]  Planned location of pickup [place]

## 2021-12-01 NOTE — Death Summary Note (Signed)
DEATH SUMMARY   Patient Details  Name: [name] MRN: [id number] DOB: [birth year]  Admission/Discharge Information   Admit Date:  11/03/21  Date of Death: Date of Death: Nov 07, 2021  Time of Death: Time of Death: 0615  Length of Stay: 2  Referring Physician: Claretta Fraise, MD   Reason(s) for Hospitalization  Acute respiratory failure with hypoxia secondary to right heart failure exacerbation and cor pulmonale.  Diagnoses  Preliminary cause of death:   Cor pulmonale/end-stage right heart failure.  Secondary Diagnoses (including complications and co-morbidities):  Principal Problem:   Acute respiratory failure with hypoxia (HCC) Active Problems:   Severe obesity (BMI >= 40) (HCC)   Atrial fibrillation, chronic (HCC)   Hypothyroidism   Current use of long term anticoagulation   Lymphedema of both lower extremities   Moderate to severe pulmonary hypertension (HCC)   Chronic indwelling Foley catheter   Sacral decubitus ulcer   Cor pulmonale [facility])   Brief Hospital Course (including significant findings, care, treatment, and services provided and events leading to death)  [name] is a 76 year old female with a history of atrial fibrillation, cor pulmonale, severe pulmonary hypertension, COPD, diabetes, who lives at home with her daughter and has been bedbound.  She has had multiple admissions to the hospital, primarily at [facility] for variety of issues.  She was recently discharged from the hospital approximately 2 days ago and records indicate that she was treated for volume overload and rapid atrial fibrillation.  She presents to [facility] with shortness of breath and by what daughter describes as worsening peripheral edema.  She is also had vomiting and decreased bowel movements for 2 days prior to admission.  She noted to be hypoxic on room air.  Chest x-ray showed some evidence of volume overload.  Patient admitted for fluid stabilization, cardiology  evaluation for follow-up for available intervention and control of her hypoxia.  Despite aggressive efforts with diuresis oxygen supplementation patient failed to respond in the developing severe/profound hypotension with associated encephalopathy and obtundation.  Palliative care service was involved and goals of care/advanced directives reviewed with family who wanted to pursued symptomatic management and comfort care only.  Cardiology service also expressed futile intervention given end-stage/terminal ongoing cor pulmonale.  Comfort care measures initiated and patient peacefully expired at 6:15 on November 07, 2021.  Please refer below for detailed mild problems medical hospitalizations and interventions: Acute on chronic respiratory failure with hypoxia -I suspect this is related to volume overload from CHF -She was on oxygen at 2 L since 21-Feb-2011, recently taken off oxygen last few weeks to months. -currently on O2 supplementation for comfort management.  -family interested to go home with hospice if patient stabilizes -prognosis is very poor. -hospital death anticipated.   Acute on chronic right-sided heart failure/cor pulmonale -Last echo from 2019/02/21 showed pulmonary pressures in the 80s and severe RV dysfunction -She was hospitalized at that time for volume overload, requiring pressors and milrinone as well as Lasix drip -Daughter reports that she has been taking torsemide 20 mg p.o. 3 times daily prior to admission. -poor response to IV lasix -BP has bottom down and patient essentially has become unresponsive. -cardiology recommending palliative care and comfort -terminal/end stage right side heart failure and pulmonary HTN -not candidate for invasive intervention.   Hypotension -Daughter reports that pressure chronically runs low, she was on 3 times daily midodrine; unable to take PO currently -will focus on comfort care only. -sildenafil and diltiazem discontinue -patient received albumin  infusion, small  IV boluses and prior to become high risk for aspiration was also on her midodrine. -now focusing on comfort care -SBP in the 50's to 60's; not looking for invasive interventions and focusing on comfort only.   Vomiting -Abdominal x-ray showed increased stool in rectum -Patient reports no bowel movement in several days -continue antiemetics.   Atrial fibrillation, permanent -Chronically on diltiazem prior to admission -Unable to take oral medications and with significant low blood pressure -Diltiazem has been discontinued -No anticoagulation will be provided at this time. -Focusing on comfort care only.   Chronic kidney disease stage IIIb -Creatinine last check at baseline -No further blood work anticipated -Focusing on comfort care only.   Chronic anemia -No reported recent bleeding -Possible related to underlying renal disease -No anticipated further blood work; full comfort care and symptomatic management mode.   Obstructive sleep apnea COPD -Daughter reports that patient has not been using CPAP for several years now.   -Continue oxygen supplementation and comfort care.   Sacral decubitus ulcers, candidal intertrigo -WOC consult, appreciate input and recommendations. -Focusing on comfort care currently.   Chronic indwelling Foley catheter -Appears to have in place since patient is bedbound and concern for skin breakdown due to urinary incontinence -Continue for now, as part of comfort care management.   Questionable bacteremia -Recent hospitalization at [facility] with 2 positive blood cultures for coagulase-negative staph pettenkoferi, which may have been contaminants -She was discharged with doxycycline for a total of 14 days which should be completed on 12/9 -Blood cultures repeated on admission and so far without growth. -Now that the patient is transition to full code for antibiotic has been discontinued.    Hypokalemia/hypomagnesemia -Electrolytes have been repleted; no further blood work anticipated at this time. -Plan is for full comfort care.   Diabetes -A1c 6.4 -Holding oral hypoglycemic agents; after transition of care to comfort we will also stop CBGs and sliding scale insulin.   Hypothyroidism -Currently unable to take oral medications due to increased somnolence; after discussion to transition to comfort care any medications not intended to provide symptomatic management will be discontinued.   Goals of care -Patient has significant chronic illnesses -Follow-up care with her primary physicians/cardiologist has been difficult since she is bedbound. -Review of records indicate that majority of visits with providers have been virtually performed prior to admission. -Per daughter, patient was followed by hospice, in the past; but was no longer actively follow prior to admission. -Overall poor prognosis and not a candidate for invasive intervention. -She remains DNR/DNI -After discussion with palliative care plan has been decided to proceed with symptomatic management and comfort care only.  Medication has been adjusted and comfort care measures ordered. [facility] death anticipated. Pertinent Labs and Studies  Significant Diagnostic Studies DG Abd 1 View  Result Date: 10/08/2021 CLINICAL DATA:  Vomiting this morning, history atrial fibrillation, coronary artery disease, CHF, COPD, diabetes mellitus, hypertension EXAM: ABDOMEN - 1 VIEW COMPARISON:  Portable exam 1711 hours compared to 05/30/2020 FINDINGS: Nonobstructive bowel gas pattern. Increased stool in rectum. No bowel dilatation or bowel wall thickening. Osseous demineralization. Prior ORIF LEFT femur. IMPRESSION: Increased stool in rectum. Electronically Signed   By: Lavonia Dana M.D.   On: 10/10/2021 17:34   DG Chest Portable 1 View  Result Date: 10/21/2021 CLINICAL DATA:  Increasing shortness of breath beginning yesterday.  EXAM: PORTABLE CHEST 1 VIEW COMPARISON:  10/25/2021 and older studies. FINDINGS: Cardiac silhouette is mildly enlarged. No mediastinal or hilar masses. Chronic thickening of the  interstitial markings as well as chronic perihilar and lung base bronchitic changes. Opacity extends along the minor fissure and there is opacity at both lung bases consistent with a combination of small effusions and atelectasis. These findings are all similar to the prior study allowing for differences in patient positioning and technique. No pneumothorax. IMPRESSION: 1. No significant change from the recent prior study. Lung base opacities are noted consistent with a combination of small effusions and atelectasis. No convincing pneumonia or pulmonary edema. Electronically Signed   By: Lajean Manes M.D.   On: 10/22/2021 13:39   ECHOCARDIOGRAM COMPLETE  Result Date: 10/28/2021    ECHOCARDIOGRAM REPORT   Patient Name:   KEIRSTYN AYDT Date of Exam: 10/28/2021 Medical Rec #:  [id number]         Height:       66.0 in Accession #:    [id number]        Weight:       167.8 lb Date of Birth:  Sep 23, 1946         BSA:          1.856 m Patient Age:    71 years          BP:           88/55 mmHg Patient Gender: F                 HR:           78 bpm. Exam Location:  [facility] Procedure: 2D Echo, Cardiac Doppler and Color Doppler Indications:    Congestive Heart Failure I50.9  History:        Patient has prior history of Echocardiogram examinations, most                 recent 10/13/2019. CHF, CAD, TIA and COPD, Arrythmias:Atrial                 Fibrillation, Signs/Symptoms:Dyspnea; Risk Factors:Dyslipidemia,                 Diabetes and Non-Smoker. Pulmonary HTN, Current use of long term                 anticoagulation.  Sonographer:    Alvino Chapel RCS Referring Phys: [place]  1. Left ventricular ejection fraction, by estimation, is 60 to 65%. The left ventricle has normal function. The left ventricle has no regional  wall motion abnormalities. Diastolic function indeterminant due to atrial fibrillation.  2. Right ventricular systolic function is normal. The right ventricular size is moderately enlarged. There is severely elevated pulmonary artery systolic pressure. The estimated right ventricular systolic pressure is 42.3 mmHg.  3. Left atrial size was severely dilated.  4. Right atrial size was severely dilated.  5. The mitral valve is normal in structure. Trivial mitral valve regurgitation.  6. Tricuspid valve regurgitation is mild to moderate.  7. The aortic valve is tricuspid. There is mild calcification of the aortic valve. There is mild thickening of the aortic valve. Aortic valve regurgitation is trivial. Aortic valve sclerosis is present, with no evidence of aortic valve stenosis.  8. The inferior vena cava is dilated in size with <50% respiratory variability, suggesting right atrial pressure of 15 mmHg. Comparison(s): Compared to prior TTE in 2020, there continues to be severe pulmonary hypertension with PASP 87mmHg on current study (previously 59mmHg). FINDINGS  Left Ventricle: Left ventricular ejection fraction, by estimation, is 60 to 65%. The left ventricle has normal  function. The left ventricle has no regional wall motion abnormalities. The left ventricular internal cavity size was normal in size. There is  no left ventricular hypertrophy. Diastolic function indeterminant due to atrial fibrillation. Right Ventricle: The right ventricular size is moderately enlarged. No increase in right ventricular wall thickness. Right ventricular systolic function is normal. There is severely elevated pulmonary artery systolic pressure. The tricuspid regurgitant velocity is 3.57 m/s, and with an assumed right atrial pressure of 15 mmHg, the estimated right ventricular systolic pressure is 48.1 mmHg. Left Atrium: Left atrial size was severely dilated. Right Atrium: Right atrial size was severely dilated. Pericardium: There is no  evidence of pericardial effusion. Mitral Valve: The mitral valve is normal in structure. There is mild thickening of the mitral valve leaflet(s). There is mild calcification of the mitral valve leaflet(s). Mild mitral annular calcification. Trivial mitral valve regurgitation. Tricuspid Valve: The tricuspid valve is normal in structure. Tricuspid valve regurgitation is mild to moderate. Aortic Valve: The aortic valve is tricuspid. There is mild calcification of the aortic valve. There is mild thickening of the aortic valve. Aortic valve regurgitation is trivial. Aortic valve sclerosis is present, with no evidence of aortic valve stenosis. Pulmonic Valve: The pulmonic valve was normal in structure. Pulmonic valve regurgitation is trivial. Aorta: The aortic root is normal in size and structure. Venous: The inferior vena cava is dilated in size with less than 50% respiratory variability, suggesting right atrial pressure of 15 mmHg. IAS/Shunts: No atrial level shunt detected by color flow Doppler.  LEFT VENTRICLE PLAX 2D LVIDd:         4.70 cm LVIDs:         3.10 cm LV PW:         1.10 cm LV IVS:        0.90 cm LVOT diam:     1.90 cm LV SV:         46 LV SV Index:   25 LVOT Area:     2.84 cm  RIGHT VENTRICLE TAPSE (M-mode): 1.5 cm LEFT ATRIUM              Index        RIGHT ATRIUM           Index LA diam:        4.80 cm  2.59 cm/m   RA Area:     36.20 cm LA Vol (A2C):   102.0 ml 54.96 ml/m  RA Volume:   143.00 ml 77.05 ml/m LA Vol (A4C):   81.1 ml  43.70 ml/m LA Biplane Vol: 97.9 ml  52.75 ml/m  AORTIC VALVE LVOT Vmax:   105.00 cm/s LVOT Vmean:  63.600 cm/s LVOT VTI:    0.162 m  AORTA Ao Root diam: 3.40 cm MITRAL VALVE                TRICUSPID VALVE MV Area (PHT): 3.53 cm     TR Peak grad:   51.0 mmHg MV Decel Time: 215 msec     TR Vmax:        357.00 cm/s MV E velocity: 121.00 cm/s                             SHUNTS                             Systemic VTI:  0.16 m  Systemic Diam: 1.90  cm Gwyndolyn Kaufman MD Electronically signed by Gwyndolyn Kaufman MD Signature Date/Time: 10/28/2021/4:27:56 PM    Final     Microbiology Recent Results (from the past 240 hour(s))  Culture, blood (routine x 2)     Status: None (Preliminary result)   Collection Time: 10/17/2021 12:54 PM   Specimen: Right Antecubital; Blood  Result Value Ref Range Status   Specimen Description   Final    RIGHT ANTECUBITAL BOTTLES DRAWN AEROBIC AND ANAEROBIC   Special Requests Blood Culture adequate volume  Final   Culture   Final    NO GROWTH 4 DAYS Performed at [facility], [address], [place], [place] [address]    Report Status PENDING  Incomplete  Culture, blood (routine x 2)     Status: None (Preliminary result)   Collection Time: 10/29/2021  1:17 PM   Specimen: BLOOD LEFT HAND  Result Value Ref Range Status   Specimen Description   Final    BLOOD LEFT HAND BOTTLES DRAWN AEROBIC AND ANAEROBIC   Special Requests   Final    Blood Culture results may not be optimal due to an inadequate volume of blood received in culture bottles   Culture   Final    NO GROWTH 4 DAYS Performed at [facility], [address], [place], [place] [address]    Report Status PENDING  Incomplete  Resp Panel by RT-PCR (Flu A&B, Covid) Nasopharyngeal Swab     Status: None   Collection Time: 10/07/2021  2:21 PM   Specimen: Nasopharyngeal Swab; Nasopharyngeal(NP) swabs in vial transport medium  Result Value Ref Range Status   SARS Coronavirus 2 by RT PCR NEGATIVE NEGATIVE Final    Comment: (NOTE) SARS-CoV-2 target nucleic acids are NOT DETECTED.  The SARS-CoV-2 RNA is generally detectable in upper respiratory specimens during the acute phase of infection. The lowest concentration of SARS-CoV-2 viral copies this assay can detect is 138 copies/mL. A negative result does not preclude SARS-Cov-2 infection and should not be used as the sole basis for treatment or other patient management decisions. A negative result may occur  with  improper specimen collection/handling, submission of specimen other than nasopharyngeal swab, presence of viral mutation(s) within the areas targeted by this assay, and inadequate number of viral copies(<138 copies/mL). A negative result must be combined with clinical observations, patient history, and epidemiological information. The expected result is Negative.  Fact Sheet for Patients:  EntrepreneurPulse.com.au  Fact Sheet for Healthcare Providers:  IncredibleEmployment.be  This test is no t yet approved or cleared by the [place] FDA and  has been authorized for detection and/or diagnosis of SARS-CoV-2 by FDA under an Emergency Use Authorization (EUA). This EUA will remain  in effect (meaning this test can be used) for the duration of the COVID-19 declaration under Section 564(b)(1) of the Act, 21 U.S.C.section 360bbb-3(b)(1), unless the authorization is terminated  or revoked sooner.       Influenza A by PCR NEGATIVE NEGATIVE Final   Influenza B by PCR NEGATIVE NEGATIVE Final    Comment: (NOTE) The Xpert Xpress SARS-CoV-2/FLU/RSV plus assay is intended as an aid in the diagnosis of influenza from Nasopharyngeal swab specimens and should not be used as a sole basis for treatment. Nasal washings and aspirates are unacceptable for Xpert Xpress SARS-CoV-2/FLU/RSV testing.  Fact Sheet for Patients: EntrepreneurPulse.com.au  Fact Sheet for Healthcare Providers: IncredibleEmployment.be  This test is not yet approved or cleared by the [place] and has been authorized for  detection and/or diagnosis of SARS-CoV-2 by FDA under an Emergency Use Authorization (EUA). This EUA will remain in effect (meaning this test can be used) for the duration of the COVID-19 declaration under Section 564(b)(1) of the Act, 21 U.S.C. section 360bbb-3(b)(1), unless the authorization is terminated  or revoked.  Performed at [facility], [address] [place], [place] [address]     Lab Basic Metabolic Panel: Recent Labs  Lab 10/19/2021 1254 10/29/2021 1420 10/28/21 0328 10/29/21 0517 10/30/21 0444  NA 134*  --  136 133* 136  K 3.2*  --  4.1 4.2 4.1  CL 100  --  103 103 105  CO2 21*  --  22 19* 17*  GLUCOSE 105*  --  86 98 140*  BUN 22  --  23 25* 27*  CREATININE 1.10*  --  1.06* 1.14* 1.31*  CALCIUM 8.1*  --  8.1* 8.2* 8.3*  MG  --  1.5* 1.6*  --  2.8*   Liver Function Tests: Recent Labs  Lab 10/16/2021 1254 10/29/21 0517 10/30/21 0444  AST 18 19 14*  ALT 14 14 11   ALKPHOS 197* 154* 152*  BILITOT 1.5* 1.4* 1.6*  PROT 4.7* 4.6* 4.1*  ALBUMIN 2.3* 2.7* 2.3*   CBC: Recent Labs  Lab 10/24/2021 1254 10/28/21 0328 10/29/21 0701 10/30/21 0641  WBC 10.9* 9.7 9.4 28.9*  NEUTROABS 9.2*  --   --   --   HGB 10.1* 9.0* 8.2* 9.9*  HCT 30.7* 27.5* 25.8* 31.1*  MCV 98.7 99.6 102.8* 101.6*  PLT 153 146* 130* 125*   Sepsis Labs: Recent Labs  Lab 10/18/2021 1254 10/03/2021 1420 10/28/21 0328 10/29/21 0701 10/30/21 0641  WBC 10.9*  --  9.7 9.4 28.9*  LATICACIDVEN 1.5 1.6  --   --   --    Time spent preparing summary: 25 minutes   Barton Dubois 11-12-21, 5:25 PM

## 2021-12-01 NOTE — Progress Notes (Signed)
Patient's daughter [name]) at bedside. Agonal breathing RR 12.

## 2021-12-01 DEATH — deceased
